# Patient Record
Sex: Female | Born: 1965 | Race: Black or African American | Hispanic: No | Marital: Single | Smoking: Never smoker
Health system: Southern US, Community
[De-identification: ages and names within clinical notes are randomized; demographics above are authoritative.]

## PROBLEM LIST (undated history)

## (undated) DIAGNOSIS — R079 Chest pain, unspecified: Secondary | ICD-10-CM

## (undated) DIAGNOSIS — T8859XA Other complications of anesthesia, initial encounter: Secondary | ICD-10-CM

## (undated) DIAGNOSIS — F419 Anxiety disorder, unspecified: Secondary | ICD-10-CM

## (undated) DIAGNOSIS — G8929 Other chronic pain: Secondary | ICD-10-CM

## (undated) DIAGNOSIS — I219 Acute myocardial infarction, unspecified: Secondary | ICD-10-CM

## (undated) DIAGNOSIS — K828 Other specified diseases of gallbladder: Secondary | ICD-10-CM

## (undated) DIAGNOSIS — R002 Palpitations: Secondary | ICD-10-CM

## (undated) DIAGNOSIS — A4902 Methicillin resistant Staphylococcus aureus infection, unspecified site: Secondary | ICD-10-CM

## (undated) DIAGNOSIS — R569 Unspecified convulsions: Secondary | ICD-10-CM

## (undated) DIAGNOSIS — J45909 Unspecified asthma, uncomplicated: Secondary | ICD-10-CM

## (undated) DIAGNOSIS — E669 Obesity, unspecified: Secondary | ICD-10-CM

## (undated) DIAGNOSIS — Z9289 Personal history of other medical treatment: Secondary | ICD-10-CM

## (undated) DIAGNOSIS — Z6841 Body Mass Index (BMI) 40.0 and over, adult: Secondary | ICD-10-CM

## (undated) DIAGNOSIS — G40909 Epilepsy, unspecified, not intractable, without status epilepticus: Secondary | ICD-10-CM

## (undated) DIAGNOSIS — T4145XA Adverse effect of unspecified anesthetic, initial encounter: Secondary | ICD-10-CM

## (undated) DIAGNOSIS — R7303 Prediabetes: Secondary | ICD-10-CM

## (undated) DIAGNOSIS — L732 Hidradenitis suppurativa: Secondary | ICD-10-CM

## (undated) DIAGNOSIS — M545 Low back pain: Secondary | ICD-10-CM

## (undated) DIAGNOSIS — R197 Diarrhea, unspecified: Secondary | ICD-10-CM

## (undated) DIAGNOSIS — K6289 Other specified diseases of anus and rectum: Secondary | ICD-10-CM

## (undated) DIAGNOSIS — G4733 Obstructive sleep apnea (adult) (pediatric): Secondary | ICD-10-CM

## (undated) DIAGNOSIS — I10 Essential (primary) hypertension: Secondary | ICD-10-CM

## (undated) DIAGNOSIS — K219 Gastro-esophageal reflux disease without esophagitis: Secondary | ICD-10-CM

## (undated) DIAGNOSIS — E039 Hypothyroidism, unspecified: Secondary | ICD-10-CM

## (undated) DIAGNOSIS — Z87898 Personal history of other specified conditions: Secondary | ICD-10-CM

## (undated) DIAGNOSIS — G43909 Migraine, unspecified, not intractable, without status migrainosus: Secondary | ICD-10-CM

## (undated) HISTORY — DX: Palpitations: R00.2

## (undated) HISTORY — DX: Obstructive sleep apnea (adult) (pediatric): G47.33

## (undated) HISTORY — PX: CARDIAC CATHETERIZATION: SHX172

## (undated) HISTORY — DX: Chest pain, unspecified: R07.9

## (undated) HISTORY — DX: Other specified diseases of gallbladder: K82.8

## (undated) HISTORY — DX: Morbid (severe) obesity due to excess calories: E66.01

## (undated) HISTORY — DX: Methicillin resistant Staphylococcus aureus infection, unspecified site: A49.02

## (undated) HISTORY — DX: Obesity, unspecified: E66.9

## (undated) HISTORY — PX: IMPLANTATION VAGAL NERVE STIMULATOR: SUR692

## (undated) HISTORY — DX: Epilepsy, unspecified, not intractable, without status epilepticus: G40.909

## (undated) HISTORY — DX: Other chronic pain: G89.29

## (undated) HISTORY — DX: Personal history of other medical treatment: Z92.89

## (undated) HISTORY — DX: Essential (primary) hypertension: I10

---
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
## 2007-03-05 ENCOUNTER — Emergency Department (HOSPITAL_COMMUNITY): Admission: EM | Admit: 2007-03-05 | Discharge: 2007-03-05 | Payer: Self-pay | Admitting: Emergency Medicine

## 2007-03-21 ENCOUNTER — Emergency Department (HOSPITAL_COMMUNITY): Admission: EM | Admit: 2007-03-21 | Discharge: 2007-03-21 | Payer: Self-pay | Admitting: Emergency Medicine

## 2008-02-27 ENCOUNTER — Ambulatory Visit: Payer: Self-pay | Admitting: Cardiovascular Disease

## 2008-02-27 ENCOUNTER — Observation Stay (HOSPITAL_COMMUNITY): Admission: EM | Admit: 2008-02-27 | Discharge: 2008-02-28 | Payer: Self-pay | Admitting: Emergency Medicine

## 2008-02-28 ENCOUNTER — Encounter: Payer: Self-pay | Admitting: Cardiovascular Disease

## 2008-03-10 ENCOUNTER — Encounter: Payer: Self-pay | Admitting: Cardiology

## 2008-03-10 ENCOUNTER — Ambulatory Visit: Payer: Self-pay

## 2008-03-17 ENCOUNTER — Ambulatory Visit: Payer: Self-pay | Admitting: Cardiology

## 2009-01-21 ENCOUNTER — Emergency Department (HOSPITAL_COMMUNITY): Admission: EM | Admit: 2009-01-21 | Discharge: 2009-01-21 | Payer: Self-pay | Admitting: Emergency Medicine

## 2009-07-27 ENCOUNTER — Emergency Department (HOSPITAL_COMMUNITY): Admission: EM | Admit: 2009-07-27 | Discharge: 2009-07-27 | Payer: Self-pay | Admitting: Emergency Medicine

## 2009-09-17 ENCOUNTER — Emergency Department (HOSPITAL_COMMUNITY): Admission: EM | Admit: 2009-09-17 | Discharge: 2009-09-17 | Payer: Self-pay | Admitting: Family Medicine

## 2010-04-14 ENCOUNTER — Emergency Department (HOSPITAL_COMMUNITY): Admission: EM | Admit: 2010-04-14 | Discharge: 2010-04-14 | Payer: Self-pay | Admitting: Emergency Medicine

## 2010-08-30 ENCOUNTER — Other Ambulatory Visit: Payer: Self-pay | Admitting: Neurology

## 2010-08-30 DIAGNOSIS — R569 Unspecified convulsions: Secondary | ICD-10-CM

## 2010-08-30 DIAGNOSIS — G43009 Migraine without aura, not intractable, without status migrainosus: Secondary | ICD-10-CM

## 2010-09-10 LAB — POCT URINALYSIS DIP (DEVICE)
Bilirubin Urine: NEGATIVE
Glucose, UA: NEGATIVE mg/dL
Hgb urine dipstick: NEGATIVE
Nitrite: NEGATIVE
Protein, ur: NEGATIVE mg/dL
Specific Gravity, Urine: 1.025 (ref 1.005–1.030)
Urobilinogen, UA: 2 mg/dL — ABNORMAL HIGH (ref 0.0–1.0)
pH: 6 (ref 5.0–8.0)

## 2010-09-10 LAB — WET PREP, GENITAL
Clue Cells Wet Prep HPF POC: NONE SEEN
Trich, Wet Prep: NONE SEEN
Yeast Wet Prep HPF POC: NONE SEEN

## 2010-09-10 LAB — POCT PREGNANCY, URINE: Preg Test, Ur: NEGATIVE

## 2010-09-10 LAB — GC/CHLAMYDIA PROBE AMP, GENITAL
Chlamydia, DNA Probe: NEGATIVE
GC Probe Amp, Genital: NEGATIVE

## 2010-10-18 NOTE — Consult Note (Signed)
NAMEJOLANA, [name] NO.:  [id number]   MEDICAL RECORD NO.:  [id number]          PATIENT TYPE:  EMS   LOCATION:  MAJO                         FACILITY:  [facility]   PHYSICIAN:  Verne Carrow, MDDATE OF BIRTH:  Aug 30, 1965   DATE OF CONSULTATION:  02/27/2008  DATE OF DISCHARGE:                                 CONSULTATION   HISTORY OF PRESENT ILLNESS:  This patient is a pleasant 45 year old  African American female with a history of hypertension and seizure  disorder since childhood who is admitted through the Emergency  Department with complaints of increased palpitations last night around  11 p.m. while lying in bed at home. There were some associated sharp  substernal chest pains when she was noticing the palpitations.  The  chest pain and palpitations continued for approximately three hours.  She presented to the Emergency Department today for further evaluation  of her chest pain.  Her initial EKG shows no ischemic changes. Her  initial point of care Troponin was 0.13 but the first real Troponin  through the lab was less than 0.01.  The patient is currently  asymptomatic.  She tells me that she had a vagal nerve stimulator placed  for treatment of her seizure disorder. While I was talking to the  patient this stimulator did activate.  The patient says that when her  stimulator activates she sometimes feels short of breath and does notice  some palpitations.  She otherwise had no complaints today and denies any  shortness of breath, dizziness, near syncope or syncope.   PAST MEDICAL HISTORY:  1. Hypertension.  2. Seizure disorder since childhood.  3. History of palpitations.  4. Reports of normal left heart catheterization at [facility] in [place] within the last year.  5. No history of diabetes mellitus or hyperlipidemia.   PAST SURGICAL HISTORY:  C-section and placement of the vagal nerve  stimulator.   ALLERGIES:   AMITRIPTYLINE AND LATEX.   HOME MEDICATIONS:  1. Toprol XL 12.5 mg once daily  2. Topiramate 50 mg twice daily.  3. Synthroid 50 mcg once daily.  4. Hydrochlorothiazide 12.5 mg once daily  5. Nifedipine 60 mg once daily.  6. Multivitamin once daily.   SOCIAL HISTORY:  The patient is single and has three children.  She does  not use tobacco, alcohol or illicit drugs.   FAMILY HISTORY:  The patient's mother died at the age of 52 from  myocardial infarction.  Her father was murdered at a young age.  She has  no other family history of coronary artery disease or sudden cardiac  death.   REVIEW OF SYSTEMS:  As noted in the history of present illness and is  otherwise negative.   PHYSICAL EXAMINATION:  GENERAL:  She is a pleasant middle aged [place]  American female in no acute distress.  VITALS:  Blood pressure 134/84, pulse 72 and regular, respirations 12  and nonlabored. Saturation is at 100% on room air.  SKIN:  Warm and dry.  NECK:  No JVP, no carotid bruits,  no thyromegaly, no lymphadenopathy.  Oropharynx clear and mucous membranes moist.  LUNGS:  Clear to auscultation bilaterally without wheezes, rhonchi or  crackles note.  CARDIOVASCULAR: Regular rate and rhythm without murmurs, gallops or rubs  noted.  ABDOMEN:  Soft, non-tender, bowel sounds are present.  EXTREMITIES:  No evidence of edema.  Pulses are 2+ in all extremities.   DIAGNOSTIC STUDIES:  1. 12 lead electrocardiogram obtained in the Emergency Room shows      normal sinus rhythm with no evidence of ischemia.  2. Laboratory values on admission show a creatinine of 0.8, potassium      3.7, hemoglobin 11.8, platelets 258,000. First Troponin less than      0.01.   ASSESSMENT & PLAN:  This is a pleasant 44 year old African American  female whose only risk factors for coronary artery disease are  hypertension and a family history of coronary disease who presents to  the Emergency Department with atypical chest pain.  She will be admitted  to a telemetry bed tonight and ruled out for a myocardial infarction  with serial cardiac enzymes.  I suspect that her chest pain has a non-  cardiac etiology. We will continue all of her home medications.  If she  rules in for a myocardial infarction we will plan a left heart  catheterization tomorrow.  Otherwise we will reassess tomorrow and  consider stress Myoview. I will also continue her Topiramate for her  seizure disorder.      Verne Carrow, MD  Electronically Signed     CM/MEDQ  D:  02/27/2008  T:  02/27/2008  Job:  [id number]

## 2010-10-18 NOTE — Discharge Summary (Signed)
NAMESALENA, Richardson NO.:  [id number]   MEDICAL RECORD NO.:  [id number]          PATIENT TYPE:  INP   LOCATION:  2021                         FACILITY:  [facility]   PHYSICIAN:  Marca Ancona, MD      DATE OF BIRTH:  08-26-1965   DATE OF ADMISSION:  02/27/2008  DATE OF DISCHARGE:  02/28/2008                               DISCHARGE SUMMARY   PRIMARY CARDIOLOGIST:  Marca Ancona, MD   DISCHARGE DIAGNOSIS:  Chest pain.   SECONDARY DIAGNOSES:  1. Hypertension.  2. Seizure disorder since childhood.  3. Palpitations.  4. History of normal cardiac catheterization, last year at [facility] in [place].  5. Status post cesarean section and placement of vagal nerve      stimulator.   ALLERGIES:  AMITRIPTYLINE and LATEX.   PROCEDURES:  None.   HISTORY OF PRESENT ILLNESS:  A 45 year old African American female with  prior history of hypertension, seizure disorder, and palpitations as  well as normal coronary artery by catheterization a year ago.  She was  in her usual state of health until approximately 11 p.m. on the evening  prior to admission when she experienced palpitations with associated  chest pain.  Symptoms lasted about 3 hours and resolved spontaneously.  Because of the symptoms during the night, she presented to the [facility] ED.  In the ED, her point-of-care troponin was 0.13, however,  subsequent troponin that was sent up to the lab was normal at 0.01.  She  was placed in observation for formal.   HOSPITAL COURSE:  [name] did rule out for MI with normal cardiac  markers x3.  She did have an episode of chest discomfort during the  night of February 27, 2008, however, felt this was likely indigestion  as it occurred after eating.  This morning she feels well and was  ambulating without difficulty.  We have arranged for a followup in our  office with an outpatient stress echocardiogram as well as event  monitoring, given her history  of palpitations.  She will be discharged  home today in good condition.   DISCHARGE LABORATORY DATA:  Hemoglobin 12.3, hematocrit 37.2, WBC 4.7,  and platelets 239.  Sodium 138, potassium 3.5, chloride 106, CO2 of 23,  BUN 10, creatinine 0.78, glucose 99, and calcium 8.8.  CK 169, MB 1.4,  and troponin I less than 0.01.  Total cholesterol 206, triglycerides 67,  HDL 45, and LDL 148.   DISPOSITION:  The patient is being discharged home today in good  condition.   FOLLOWUP PLANS AND APPOINTMENTS:  We have arranged for her to follow up  in our office for a stress echocardiogram, March 10, 2008, at 10:30  a.m.  She will also pick up an event monitor on that day.  She will  follow up with Dr. Marca Ancona on March 24, 2008, at 9:15 a.m.   DISCHARGE MEDICATIONS:  1. Protonix 40 mg daily (new).  2. Nitroglycerin 0.4 mg sublingual p.r.n. chest pain.  3. Prenatal plus daily.  4. Hydrochlorothiazide 12.5  mg daily.  5. Metoprolol ER 12.5 mg daily.  6. Topiramate 50 mg 2 tablets daily.  7. Synthroid 50 mcg daily.  8. Flovent inhaler 2 puffs daily.  9. Nifedipine ER 60 mg daily.   OUTSTANDING LABORATORY STUDIES:  Stress echocardiogram and event  monitoring pending.   DURATION OF DISCHARGE/ENCOUNTER:  Sixty minutes including physician  time.      Nicolasa Ducking, ANP      Marca Ancona, MD  Electronically Signed    CB/MEDQ  D:  02/28/2008  T:  02/29/2008  Job:  [phone]

## 2010-11-04 ENCOUNTER — Other Ambulatory Visit: Payer: Self-pay | Admitting: Podiatry

## 2010-11-04 DIAGNOSIS — R52 Pain, unspecified: Secondary | ICD-10-CM

## 2010-11-07 ENCOUNTER — Other Ambulatory Visit: Payer: Self-pay

## 2011-03-01 ENCOUNTER — Emergency Department (HOSPITAL_COMMUNITY): Payer: Medicare HMO

## 2011-03-01 ENCOUNTER — Emergency Department (HOSPITAL_COMMUNITY)
Admission: EM | Admit: 2011-03-01 | Discharge: 2011-03-02 | Disposition: A | Discharge status: Home / Self Care | Payer: Medicare HMO | Attending: Emergency Medicine | Admitting: Emergency Medicine

## 2011-03-01 DIAGNOSIS — Z79899 Other long term (current) drug therapy: Secondary | ICD-10-CM | POA: Insufficient documentation

## 2011-03-01 DIAGNOSIS — R51 Headache: Secondary | ICD-10-CM | POA: Insufficient documentation

## 2011-03-01 DIAGNOSIS — G40909 Epilepsy, unspecified, not intractable, without status epilepticus: Secondary | ICD-10-CM | POA: Insufficient documentation

## 2011-03-01 DIAGNOSIS — E039 Hypothyroidism, unspecified: Secondary | ICD-10-CM | POA: Insufficient documentation

## 2011-03-01 DIAGNOSIS — I10 Essential (primary) hypertension: Secondary | ICD-10-CM | POA: Insufficient documentation

## 2011-03-01 DIAGNOSIS — H53149 Visual discomfort, unspecified: Secondary | ICD-10-CM | POA: Insufficient documentation

## 2011-03-01 DIAGNOSIS — R209 Unspecified disturbances of skin sensation: Secondary | ICD-10-CM | POA: Insufficient documentation

## 2011-03-01 DIAGNOSIS — R112 Nausea with vomiting, unspecified: Secondary | ICD-10-CM | POA: Insufficient documentation

## 2011-03-01 LAB — POCT I-STAT, CHEM 8
BUN: 16 mg/dL (ref 6–23)
Calcium, Ion: 1.26 mmol/L (ref 1.12–1.32)
Chloride: 108 mEq/L (ref 96–112)
Creatinine, Ser: 0.9 mg/dL (ref 0.50–1.10)
Glucose, Bld: 91 mg/dL (ref 70–99)
HCT: 39 % (ref 36.0–46.0)
Hemoglobin: 13.3 g/dL (ref 12.0–15.0)
Potassium: 3.8 mEq/L (ref 3.5–5.1)
Sodium: 141 mEq/L (ref 135–145)
TCO2: 21 mmol/L (ref 0–100)

## 2011-03-06 LAB — POCT I-STAT, CHEM 8
BUN: 14
Calcium, Ion: 1.16
Chloride: 112
Creatinine, Ser: 0.8
Glucose, Bld: 86
HCT: 37
Hemoglobin: 12.6
Potassium: 3.7
Sodium: 142
TCO2: 20

## 2011-03-06 LAB — LIPID PANEL
Cholesterol: 206 — ABNORMAL HIGH
HDL: 45
LDL Cholesterol: 148 — ABNORMAL HIGH
Total CHOL/HDL Ratio: 4.6
Triglycerides: 67
VLDL: 13

## 2011-03-06 LAB — URINALYSIS, ROUTINE W REFLEX MICROSCOPIC
Bilirubin Urine: NEGATIVE
Glucose, UA: NEGATIVE
Ketones, ur: NEGATIVE
Nitrite: NEGATIVE
Protein, ur: NEGATIVE
Specific Gravity, Urine: 1.025
Urobilinogen, UA: 1
pH: 7.5

## 2011-03-06 LAB — DIFFERENTIAL
Basophils Absolute: 0.1
Basophils Relative: 2 — ABNORMAL HIGH
Eosinophils Absolute: 0.4
Eosinophils Relative: 9 — ABNORMAL HIGH
Lymphocytes Relative: 41
Lymphs Abs: 1.9
Monocytes Absolute: 0.3
Monocytes Relative: 7
Neutro Abs: 1.9
Neutrophils Relative %: 42 — ABNORMAL LOW

## 2011-03-06 LAB — BASIC METABOLIC PANEL
BUN: 10
CO2: 23
Calcium: 8.8
Chloride: 106
Creatinine, Ser: 0.78
GFR calc Af Amer: >60
GFR calc non Af Amer: >60
Glucose, Bld: 99
Potassium: 3.5
Sodium: 138

## 2011-03-06 LAB — CK TOTAL AND CKMB (NOT AT ARMC)
CK, MB: 1.9
Relative Index: 1
Total CK: 183 — ABNORMAL HIGH

## 2011-03-06 LAB — CARDIAC PANEL(CRET KIN+CKTOT+MB+TROPI)
CK, MB: 1.4
CK, MB: 1.6
Relative Index: 0.8
Relative Index: 0.9
Total CK: 169
Total CK: 172
Troponin I: 0.01
Troponin I: <0.01

## 2011-03-06 LAB — URINE MICROSCOPIC-ADD ON

## 2011-03-06 LAB — URINE CULTURE
Colony Count: NO GROWTH
Culture: NO GROWTH

## 2011-03-06 LAB — CBC
HCT: 35 — ABNORMAL LOW
HCT: 37.2
Hemoglobin: 11.8 — ABNORMAL LOW
Hemoglobin: 12.3
MCHC: 33.1
MCHC: 33.8
MCV: 84.8
MCV: 86.7
Platelets: 239
Platelets: 258
RBC: 4.13
RBC: 4.29
RDW: 13.1
RDW: 13.4
WBC: 4.6
WBC: 4.7

## 2011-03-06 LAB — TROPONIN I: Troponin I: <0.01

## 2011-03-06 LAB — POCT CARDIAC MARKERS
CKMB, poc: <1 — ABNORMAL LOW
Myoglobin, poc: 42.3
Troponin i, poc: 0.13 — ABNORMAL HIGH

## 2011-03-06 LAB — TSH: TSH: 4.482

## 2011-03-16 LAB — POCT URINALYSIS DIP (DEVICE)
Glucose, UA: NEGATIVE
Nitrite: NEGATIVE
Operator id: 239701
Protein, ur: NEGATIVE
Specific Gravity, Urine: 1.025
Urobilinogen, UA: 2 — ABNORMAL HIGH
pH: 5.5

## 2011-03-16 LAB — RPR: RPR Ser Ql: NONREACTIVE

## 2011-03-16 LAB — URINE CULTURE
Colony Count: NO GROWTH
Culture: NO GROWTH

## 2011-03-16 LAB — GC/CHLAMYDIA PROBE AMP, GENITAL
Chlamydia, DNA Probe: NEGATIVE
GC Probe Amp, Genital: NEGATIVE

## 2011-03-16 LAB — POCT PREGNANCY, URINE
Operator id: 239701
Preg Test, Ur: NEGATIVE

## 2011-03-16 LAB — HIV ANTIBODY (ROUTINE TESTING W REFLEX): HIV: NONREACTIVE

## 2011-04-05 ENCOUNTER — Other Ambulatory Visit (HOSPITAL_COMMUNITY): Payer: Self-pay | Admitting: Obstetrics and Gynecology

## 2011-04-05 DIAGNOSIS — R079 Chest pain, unspecified: Secondary | ICD-10-CM

## 2011-04-06 ENCOUNTER — Encounter (HOSPITAL_COMMUNITY): Payer: Medicare HMO | Admitting: Radiology

## 2011-04-10 ENCOUNTER — Encounter: Payer: Self-pay | Admitting: Cardiology

## 2011-04-10 ENCOUNTER — Ambulatory Visit (HOSPITAL_COMMUNITY): Payer: Medicare PPO | Attending: Cardiology | Admitting: Radiology

## 2011-04-10 VITALS — Ht 65.0 in | Wt 217.0 lb

## 2011-04-10 DIAGNOSIS — R0989 Other specified symptoms and signs involving the circulatory and respiratory systems: Secondary | ICD-10-CM | POA: Insufficient documentation

## 2011-04-10 DIAGNOSIS — R0789 Other chest pain: Secondary | ICD-10-CM | POA: Insufficient documentation

## 2011-04-10 DIAGNOSIS — R42 Dizziness and giddiness: Secondary | ICD-10-CM | POA: Insufficient documentation

## 2011-04-10 DIAGNOSIS — R0602 Shortness of breath: Secondary | ICD-10-CM

## 2011-04-10 DIAGNOSIS — R110 Nausea: Secondary | ICD-10-CM | POA: Insufficient documentation

## 2011-04-10 DIAGNOSIS — R0609 Other forms of dyspnea: Secondary | ICD-10-CM | POA: Insufficient documentation

## 2011-04-10 DIAGNOSIS — I2510 Atherosclerotic heart disease of native coronary artery without angina pectoris: Secondary | ICD-10-CM | POA: Insufficient documentation

## 2011-04-10 DIAGNOSIS — R002 Palpitations: Secondary | ICD-10-CM | POA: Insufficient documentation

## 2011-04-10 DIAGNOSIS — R5381 Other malaise: Secondary | ICD-10-CM | POA: Insufficient documentation

## 2011-04-10 DIAGNOSIS — I491 Atrial premature depolarization: Secondary | ICD-10-CM

## 2011-04-10 DIAGNOSIS — I10 Essential (primary) hypertension: Secondary | ICD-10-CM | POA: Insufficient documentation

## 2011-04-10 DIAGNOSIS — R61 Generalized hyperhidrosis: Secondary | ICD-10-CM | POA: Insufficient documentation

## 2011-04-10 DIAGNOSIS — J45909 Unspecified asthma, uncomplicated: Secondary | ICD-10-CM | POA: Insufficient documentation

## 2011-04-10 DIAGNOSIS — R079 Chest pain, unspecified: Secondary | ICD-10-CM

## 2011-04-10 DIAGNOSIS — R9431 Abnormal electrocardiogram [ECG] [EKG]: Secondary | ICD-10-CM | POA: Insufficient documentation

## 2011-04-10 MED ORDER — TECHNETIUM TC 99M TETROFOSMIN IV KIT
33.0000 | PACK | Freq: Once | INTRAVENOUS | Status: AC | PRN
  Administered 2011-04-10: 33 via INTRAVENOUS

## 2011-04-10 NOTE — Progress Notes (Addendum)
[facility] SITE 3 NUCLEAR MED [address] [place] [address] [phone]  Cardiology Nuclear Med Study  [name] is a 45 y.o. female [id number] January 14, 1966   Nuclear Med Background Indication for Stress Test:  Evaluation for Ischemia History: Abnormal EKG, Asthma, '08 Cath: NL ([place], [place]), 10/09 Echo: (Stress, Submaximal): (-) Ischemia, EF=60%, history vagal nerve stimulator for seizure disorder Cardiac Risk Factors: Family History - CAD and Hypertension  Symptoms:  Chest Pain and  Pressure with and without Exertion (last date of chest discomfort earlier this am), Diaphoresis, Dizziness, DOE, Fatigue, Fatigue with Exertion, Light-Headedness, Nausea and Palpitations   Nuclear Pre-Procedure Caffeine/Decaff Intake:  None NPO After: 7:00pm   Lungs:  Clear IV 0.9% NS with Angio Cath:  20g  IV Site: R Antecubital  IV Started by:  Stanton Kidney, EMT-P  Chest Size (in):  48 Cup Size: DD  Height: 5\' 5"  (1.651 m)  Weight:  217 lb (98.431 kg)  BMI:  Body mass index is 36.11 kg/(m^2). Tech Comments:  Metoprolol held > 24 hours, per patient.    Nuclear Med Study 1 or 2 day study: 2 day  Stress Test Type:  Stress  Reading MD: Olga Millers, MD  Order Authorizing Provider:  Marca Ancona, MD, Maisie Fus, MD  Resting Radionuclide: Technetium 3m Tetrofosmin  Resting Radionuclide Dose: 33.0 mCi on 04/18/11   Stress Radionuclide:  Technetium 13m Tetrofosmin  Stress Radionuclide Dose: 33.0 mCi on 04/10/11           Stress Protocol Rest HR: 92 Stress HR: 150  Rest BP: 112/71 Stress BP: 132/64  Exercise Time (min): 7:15 METS: 9.1   Predicted Max HR: 173 bpm % Max HR: 86.71 bpm Rate Pressure Product: 52841   Dose of Adenosine (mg):  n/a Dose of Lexiscan: n/a mg  Dose of Atropine (mg): n/a Dose of Dobutamine: n/a mcg/kg/min (at max HR)  Stress Test Technologist: Irean Hong, RN  Nuclear Technologist:  Doyne Keel, CNMT     Rest Procedure:   Myocardial perfusion imaging was performed at rest 45 minutes following the intravenous administration of Technetium 86m Tetrofosmin. Rest ECG: NSR, short PR, and T wave changes  Stress Procedure:  The patient exercised for 7 minutes and 15 seconds, RPE=17.  The patient stopped due to DOE and chest tightness 8/10 at peak exercise.  There were significant ST-T wave changes. The patient complained of lightheadedness immediately post exercise with a slight decrease in BP to 130/57. There were occasional PAC's. Technetium 19m Tetrofosmin was injected at peak exercise and myocardial perfusion imaging was performed after a brief delay.The patient has an office visit with Dr.Dalton Shirlee Latch on 04/11/11. Stress ECG: Significant ST abnormalities consistent with ischemia.  QPS Raw Data Images:  There is a breast shadow. Stress Images:  There is decreased uptake in the apex. Rest Images:  There is decreased uptake in the apex. Subtraction (SDS):  No evidence of ischemia. Transient Ischemic Dilatation (Normal <1.22):  1.02 Lung/Heart Ratio (Normal <0.45):  0.32  Quantitative Gated Spect Images QGS EDV:  85 ml QGS ESV:  29 ml QGS cine images:  NL LV Function; NL Wall Motion QGS EF: 66%  Impression Exercise Capacity:  Fair exercise capacity. BP Response:  Normal blood pressure response. Clinical Symptoms:  Chest pain with exercise. ECG Impression:  Significant ST abnormalities consistent with ischemia. Comparison with Prior Nuclear Study: No previous nuclear study performed  Overall Impression:  Stress nuclear study with chest pain and ST changes; mild  apical thinning but no ischemia noted on perfusion images.     Olga Millers   Abnormal exercise ECG and chest pain.  Fixed apical defect that could be suggestive of prior MI but no ischemia on perfusion images.  Needs office followup.   Dalton [employer]

## 2011-04-11 ENCOUNTER — Encounter (HOSPITAL_COMMUNITY): Payer: Medicare PPO | Admitting: Radiology

## 2011-04-11 ENCOUNTER — Ambulatory Visit: Payer: Medicare HMO | Admitting: Cardiology

## 2011-04-18 ENCOUNTER — Encounter (HOSPITAL_COMMUNITY): Payer: Medicare HMO | Admitting: Radiology

## 2011-04-18 ENCOUNTER — Ambulatory Visit (HOSPITAL_COMMUNITY): Payer: Medicare PPO | Attending: Cardiology | Admitting: Radiology

## 2011-04-18 DIAGNOSIS — R0989 Other specified symptoms and signs involving the circulatory and respiratory systems: Secondary | ICD-10-CM

## 2011-04-18 MED ORDER — TECHNETIUM TC 99M TETROFOSMIN IV KIT
33.0000 | PACK | Freq: Once | INTRAVENOUS | Status: AC | PRN
  Administered 2011-04-18: 33 via INTRAVENOUS

## 2011-04-20 NOTE — Progress Notes (Signed)
Discussed results with pt. Appt 04/24/11 with Dr Shirlee Latch

## 2011-04-20 NOTE — Progress Notes (Signed)
Pt has an appt 05/03/11 with Dr Shirlee Latch. LMTCB

## 2011-04-24 ENCOUNTER — Ambulatory Visit (INDEPENDENT_AMBULATORY_CARE_PROVIDER_SITE_OTHER): Payer: Medicare PPO | Admitting: Cardiology

## 2011-04-24 ENCOUNTER — Encounter: Payer: Self-pay | Admitting: *Deleted

## 2011-04-24 ENCOUNTER — Encounter: Payer: Self-pay | Admitting: Cardiology

## 2011-04-24 VITALS — BP 120/78 | HR 92 | Ht 65.0 in | Wt 220.0 lb

## 2011-04-24 DIAGNOSIS — I10 Essential (primary) hypertension: Secondary | ICD-10-CM | POA: Insufficient documentation

## 2011-04-24 DIAGNOSIS — R0789 Other chest pain: Secondary | ICD-10-CM | POA: Insufficient documentation

## 2011-04-24 DIAGNOSIS — R079 Chest pain, unspecified: Secondary | ICD-10-CM

## 2011-04-24 MED ORDER — ISOSORBIDE MONONITRATE ER 60 MG PO TB24: 60.0000 mg | ORAL_TABLET | Freq: Two times a day (BID) | ORAL | Status: DC

## 2011-04-24 NOTE — Patient Instructions (Addendum)
Increase Imdur(isosorbide) to 60mg  daily. You can take two 30mg  tablets daily at the same time.    Your physician has requested that you have cardiac CT. Cardiac computed tomography (CT) is a painless test that uses an x-ray machine to take clear, detailed pictures of your heart. For further information please visit [email or web address] Please follow instruction sheet as given. This week on a day Dr Shirlee Latch is in the hospital--11/21/or 11/23 TAKE LOPRESSOR(METOPROLOL) 50mg  2 HOURS BEFORE THE CT SCAN--this will be four 12.5mg  tablets.  Your physician has requested that you have an echocardiogram. Echocardiography is a painless test that uses sound waves to create images of your heart. It provides your doctor with information about the size and shape of your heart and how well your heart's chambers and valves are working. This procedure takes approximately one hour. There are no restrictions for this procedure.   Your physician recommends that you return for a FASTING lipid profile /BMET/TSH 786.50 -you can have this when you come back for the echocardiogram.    Your physician recommends that you schedule a follow-up appointment in: 2 weeks with Dr Shirlee Latch.

## 2011-04-24 NOTE — Assessment & Plan Note (Signed)
BP is under control. ° °

## 2011-04-24 NOTE — Assessment & Plan Note (Addendum)
Patient has had episodes of chest pain both with exertion and at rest.  She does have a family history of premature CAD as well as HTN.  ETT-myoview was equivocal: ST segment changes on ECG but no ischemia on myoview images.  Presentation is somewhat worrisome, but she did have similar symptoms in 2008 and apparently had a negative cath at that time.  Premature CAD is possible, but I would also be concerned about the possibility of microvascular angina or coronary vasospasm.  It is interesting that she has also been having worse migraines.  I would certainly entertain the possibility that use of triptan medications for her migraines could potentiate coronary vasospasm (though she cannot temporally associate taking a triptan medication with chest pain).   - I would like to have her do a coronary CT angiogram.  I will have her take 50 mg metoprolol 2 hours before the test.  If this is normal, no further ischemic workup.  If this is abnormal or equivocal, cath.  - Increase Imdur to 60 mg daily, continue Lopressor 12.5 mg bid.  - Continue ASA 81 mg daily.  - Check lipids - I will get an echocardiogram to make sure that LV function is normal.  - I asked her to talk to Dr. Threasa Beards her neurologist about her migraines.  I would like her medications to be adjusted to minimize use of triptan medications given potential for vasospasm.

## 2011-04-24 NOTE — Progress Notes (Signed)
PCP: Dr. Parke Simmers [facility]), Dr. Tona Sensing Lacy Duverney)  45 yo with history of chest pain and prior negative cath as well as migraines and HTN presents for cardiology evaluation.  She has had chest pain chronically over a number of years.  She had an abnormal stress test in [place] in 2008.  She was then sent for a cardiac cath, which she says was normal.  Since then, she has had on and off episodes of chest discomfort.  Her symptoms have been worse, however, for the last month.   Over the last month, she has been getting episodes of chest pressure at night while in bed.  She will also get chest pressure when walking a short distance or going up a flight of steps.  Prior to the past month, she was only having rare episodes of chest pain.  She also gets episodes of chest pressure at rest, such as when watching television.  She has also been having severe migraines for last month, recently associated with facial numbness.  She has been taking sumatriptan when she gets a migraine.  She does not associate using the triptans with chest pain episodes.    We had her do an ETT-myoview in this office.  This showed ischemic-type ST depression on exercise ECG, but there was no evidence for ischemia on myoview images.  EF was 66%.   ECG: NSR, nonspecific anterior T wave flattening.   Labs (9/12): creatinine 0.9  PMH:  1. Chest pain: Multiple episodes over the years.  She had an abnormal stress test in 2008 in [place] and it sounds like she went to [facility] for a cardiac catheterization which per her report was normal.   Stress echo at [facility] in 2010 was submaximal but showed no evidence for ischemia.  ETT-myoview (11/12): 7'15", EF 66%, significant ST depression on exercise ECG, apical thinning on myoview images but no evidence for ischemia.  2. Migraines: Uses triptan medications.  3. HTN 4. Seizure disorder: Has vagus nerve stimulator.  5. Hypothyroidism  SH: Moved to [place] from [place].  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  Nonsmoker.   FH: Mother with MI at 42.  Grandmother with MI at 21.   ROS: All systems reviewed and negative except as per HPI.   Current Outpatient Prescriptions  Medication Sig Dispense Refill  . aspirin 81 MG tablet Take 81 mg by mouth daily.        . fluticasone (FLOVENT HFA) 44 MCG/ACT inhaler Inhale 2 puffs into the lungs 2 (two) times daily.        . hydrochlorothiazide (HYDRODIURIL) 12.5 MG tablet Take 12.5 mg by mouth daily.        Marland Kitchen levothyroxine (SYNTHROID, LEVOTHROID) 50 MCG tablet Take 50 mcg by mouth daily.        . metoprolol tartrate (LOPRESSOR) 12.5 mg TABS Take 12.5 mg by mouth daily.        Marland Kitchen NIFEdipine (PROCARDIA-XL/ADALAT CC) 60 MG 24 hr tablet Take 60 mg by mouth daily.        . prenatal vitamin w/FE, FA (PRENATAL 1 + 1) 27-1 MG TABS Take 1 tablet by mouth as needed.       . rizatriptan (MAXALT) 10 MG tablet Take 10 mg by mouth as needed. May repeat in 2 hours if needed       . SUMAtriptan (IMITREX) 100 MG tablet Take 100 mg by mouth every 2 (two) hours as needed.        Marland Kitchen  topiramate (TOPAMAX) 50 MG tablet Take 50 mg by mouth 2 (two) times daily.        . isosorbide mononitrate (IMDUR) 60 MG 24 hr tablet Take 1 tablet (60 mg total) by mouth 2 (two) times daily.  60 tablet  6    BP 120/78  Pulse 92  Ht 5\' 5"  (1.651 m)  Wt 99.791 kg (220 lb)  BMI 36.61 kg/m2 General: NAD, overweight Neck: No JVD, no thyromegaly or thyroid nodule.  Lungs: Clear to auscultation bilaterally with normal respiratory effort. CV: Nondisplaced PMI.  Heart regular S1/S2, no S3/S4, no murmur.  No peripheral edema.  No carotid bruit.  Normal pedal pulses.  Abdomen: Soft, nontender, no hepatosplenomegaly, no distention.  Skin: Intact without lesions or rashes.  Neurologic: Alert and oriented x 3.  Psych: Normal affect. Extremities: No clubbing or cyanosis.  HEENT: Normal.

## 2011-05-01 ENCOUNTER — Telehealth: Payer: Self-pay | Admitting: Cardiology

## 2011-05-01 MED ORDER — METOPROLOL TARTRATE 50 MG PO TABS: ORAL_TABLET | ORAL | Status: DC

## 2011-05-01 NOTE — Telephone Encounter (Signed)
I talked with pt. She is aware of Dr McLean's recommendations. 

## 2011-05-01 NOTE — Telephone Encounter (Signed)
New Msg: Pt calling stating that medication given prior to pt ECHO was incorrect.   Pt said she takes 25 mg of metoprolol, pt was given 12.5 mg of metoprolol.   Please return pt call to discuss further.

## 2011-05-01 NOTE — Telephone Encounter (Signed)
I talked with pt. Pt states she is taking metoprolol succinate 25mg  daily and not metoprolol tartrate 12.5mg  daily. Pt asking what she should take prior to CCTA since she is taking metoprolol succinate and not metoprolol tartrate. I reviewed with Dr Shirlee Latch. Pt to take metoprolol tartrate 50mg  as planned 2 hours prior to CCTA in addition to her regular metoprolol succinate dose. LMTCB

## 2011-05-02 ENCOUNTER — Ambulatory Visit (HOSPITAL_COMMUNITY): Payer: Medicare PPO | Attending: Cardiology

## 2011-05-02 ENCOUNTER — Other Ambulatory Visit (INDEPENDENT_AMBULATORY_CARE_PROVIDER_SITE_OTHER): Payer: Medicare PPO | Admitting: *Deleted

## 2011-05-02 DIAGNOSIS — R0609 Other forms of dyspnea: Secondary | ICD-10-CM | POA: Insufficient documentation

## 2011-05-02 DIAGNOSIS — R0989 Other specified symptoms and signs involving the circulatory and respiratory systems: Secondary | ICD-10-CM | POA: Insufficient documentation

## 2011-05-02 DIAGNOSIS — R079 Chest pain, unspecified: Secondary | ICD-10-CM

## 2011-05-02 DIAGNOSIS — R072 Precordial pain: Secondary | ICD-10-CM | POA: Insufficient documentation

## 2011-05-02 DIAGNOSIS — I079 Rheumatic tricuspid valve disease, unspecified: Secondary | ICD-10-CM | POA: Insufficient documentation

## 2011-05-02 DIAGNOSIS — I059 Rheumatic mitral valve disease, unspecified: Secondary | ICD-10-CM | POA: Insufficient documentation

## 2011-05-02 DIAGNOSIS — I379 Nonrheumatic pulmonary valve disorder, unspecified: Secondary | ICD-10-CM | POA: Insufficient documentation

## 2011-05-02 DIAGNOSIS — I10 Essential (primary) hypertension: Secondary | ICD-10-CM

## 2011-05-02 LAB — BASIC METABOLIC PANEL
BUN: 11 mg/dL (ref 6–23)
CO2: 24 mEq/L (ref 19–32)
Calcium: 8.8 mg/dL (ref 8.4–10.5)
Chloride: 109 mEq/L (ref 96–112)
Creatinine, Ser: 0.8 mg/dL (ref 0.4–1.2)
GFR: 103.25 mL/min (ref >60.00)
Glucose, Bld: 83 mg/dL (ref 70–99)
Potassium: 3.6 mEq/L (ref 3.5–5.1)
Sodium: 140 mEq/L (ref 135–145)

## 2011-05-02 LAB — LIPID PANEL
Cholesterol: 192 mg/dL (ref 0–200)
HDL: 49.2 mg/dL (ref >39.00)
LDL Cholesterol: 130 mg/dL — ABNORMAL HIGH (ref 0–99)
Total CHOL/HDL Ratio: 4
Triglycerides: 62 mg/dL (ref 0.0–149.0)
VLDL: 12.4 mg/dL (ref 0.0–40.0)

## 2011-05-02 LAB — TSH: TSH: 1.46 u[IU]/mL (ref 0.35–5.50)

## 2011-05-03 ENCOUNTER — Ambulatory Visit: Payer: Medicare PPO | Admitting: Cardiology

## 2011-05-03 ENCOUNTER — Ambulatory Visit (HOSPITAL_COMMUNITY)
Admission: RE | Admit: 2011-05-03 | Discharge: 2011-05-03 | Disposition: A | Discharge status: Home / Self Care | Payer: Medicare PPO | Source: Ambulatory Visit | Attending: Cardiology | Admitting: Cardiology

## 2011-05-03 DIAGNOSIS — R079 Chest pain, unspecified: Secondary | ICD-10-CM | POA: Insufficient documentation

## 2011-05-03 MED ORDER — METOPROLOL TARTRATE 1 MG/ML IV SOLN
INTRAVENOUS | Status: AC
  Administered 2011-05-03: 5 mg
  Filled 2011-05-03: qty 15

## 2011-05-03 MED ORDER — IOHEXOL 350 MG/ML SOLN
80.0000 mL | Freq: Once | INTRAVENOUS | Status: AC | PRN
  Administered 2011-05-03: 80 mL via INTRAVENOUS

## 2011-05-03 MED ORDER — NITROGLYCERIN 0.4 MG SL SUBL
SUBLINGUAL_TABLET | SUBLINGUAL | Status: AC
  Administered 2011-05-03: 0.4 mg
  Filled 2011-05-03: qty 25

## 2011-05-03 MED ORDER — METOPROLOL TARTRATE 1 MG/ML IV SOLN
5.0000 mg | INTRAVENOUS | Status: DC | PRN
  Administered 2011-05-03 (×3): 5 mg via INTRAVENOUS
  Filled 2011-05-03: qty 5

## 2011-05-03 MED ORDER — IOHEXOL 350 MG/ML SOLN
28.0000 mL | Freq: Once | INTRAVENOUS | Status: AC | PRN
  Administered 2011-05-03: 28 mL via INTRAVENOUS

## 2011-05-04 NOTE — Progress Notes (Signed)
Addended by: Brien Mates R on: 05/04/2011 12:49 PM   Modules accepted: Orders

## 2011-05-05 ENCOUNTER — Telehealth: Payer: Self-pay | Admitting: Cardiology

## 2011-05-05 NOTE — Telephone Encounter (Signed)
Pt came in signed ROI, Records were faxed to: [facility] @ [phone]& [facility] Dr.Velta Parke Simmers @ [phone] 05-05-11/km

## 2011-05-17 ENCOUNTER — Ambulatory Visit (INDEPENDENT_AMBULATORY_CARE_PROVIDER_SITE_OTHER): Payer: Medicare PPO | Admitting: Cardiology

## 2011-05-17 ENCOUNTER — Encounter: Payer: Self-pay | Admitting: Cardiology

## 2011-05-17 DIAGNOSIS — E785 Hyperlipidemia, unspecified: Secondary | ICD-10-CM

## 2011-05-17 DIAGNOSIS — R079 Chest pain, unspecified: Secondary | ICD-10-CM

## 2011-05-17 DIAGNOSIS — I10 Essential (primary) hypertension: Secondary | ICD-10-CM

## 2011-05-17 DIAGNOSIS — E7800 Pure hypercholesterolemia, unspecified: Secondary | ICD-10-CM

## 2011-05-17 HISTORY — DX: Hyperlipidemia, unspecified: E78.5

## 2011-05-17 MED ORDER — FLUTICASONE-SALMETEROL 250-50 MCG/DOSE IN AEPB: INHALATION_SPRAY | RESPIRATORY_TRACT | Status: DC

## 2011-05-17 MED ORDER — PRAVASTATIN SODIUM 40 MG PO TABS: 40.0000 mg | ORAL_TABLET | Freq: Every evening | ORAL | Status: DC

## 2011-05-17 NOTE — Assessment & Plan Note (Signed)
BP is well controlled.  She is now on Exforge and Bystolic.

## 2011-05-17 NOTE — Progress Notes (Signed)
PCP: Dr. Parke Simmers [facility]), Dr. Tona Sensing [place] Duverney)  45 yo with history of chest pain and prior negative cath as well as migraines and HTN presents for cardiology followup.  She has had chest pain chronically over a number of years.  She had an abnormal stress test in [place] in 2008.  She was then sent for a cardiac cath, which she says was normal.  Since then, she has had on and off episodes of chest discomfort.  Her symptoms have been worse, however, for the last few months.   We had her do an ETT-myoview in this office.  This showed ischemic-type ST depression on exercise ECG, but there was no evidence for ischemia on myoview images.  EF was 66%.  Given ongoing chest pain and ischemic ECG changes with normal perfusion images (equivocal study), I had her get a coronary CT angiogram.  This was a difficult study because we were not able to get her HR as low as would be ideal.  However, she had no coronary calcium and no definite stenosis was seen (though mid-RCA was not fully evaluated due to artifact).  I think that this was a negative study.    Symptoms are stable.  She has been getting episodes of chest pressure at night while in bed.  She will also get chest pressure when walking a short distance or going up a flight of steps. She also gets episodes of chest pressure at rest, such as when watching television.  She has not been taking her triptan migraine medication, but still is having chest pain episodes.  On further questioning, her chest pain has been associated with wheezing.  She does get exertional wheezing.  She has had a history of asthma since childhood.  She will occasionally use albuterol when she gets the exertional chest pain and wheezing and thinks it helps.    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 Chest pain: Multiple episodes over the years.  She had an abnormal stress test in 2008 in [place] and it sounds  like she went to [facility] for a cardiac catheterization which per her report was normal.   Stress echo at [facility] in 2010 was submaximal but showed no evidence for ischemia.  ETT-myoview (11/12): 7'15", EF 66%, significant ST depression on exercise ECG, apical thinning on myoview images but no evidence for ischemia. Coronary CT angiogram (11/12): calcium score 0, difficult study with gating artifact, mid RCA nonevaluatable but no stenosis elsewhere in the coronaries.   Echo (11/12): EF 55-60%, mild LV hypertrophy, mild MR.  2. Migraines: Uses triptan medications.  3. HTN 4. Seizure disorder: Has vagus nerve stimulator.  5. Hypothyroidism 6. Asthma since childhood  SH: Moved to [place] from [place].  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   FH: Mother with MI at 42.  Grandmother with MI at 71.   ROS: All systems reviewed and negative except as per HPI.   Current Outpatient Prescriptions  Medication Sig Dispense Refill  . Amlodipine-Valsartan-HCTZ 5-160-25 MG TABS Take 1 tablet by mouth daily.        Marland Kitchen aspirin 81 MG tablet Take 81 mg by mouth daily.        . isosorbide mononitrate (IMDUR) 60 MG 24 hr tablet One daily      . levothyroxine (SYNTHROID, LEVOTHROID) 50 MCG tablet Take 50 mcg by mouth daily.        Marland Kitchen  prenatal vitamin w/FE, FA (PRENATAL 1 + 1) 27-1 MG TABS Take 1 tablet by mouth as needed.       . rizatriptan (MAXALT) 10 MG tablet Take 10 mg by mouth as needed. May repeat in 2 hours if needed       . SUMAtriptan (IMITREX) 100 MG tablet Take 100 mg by mouth every 2 (two) hours as needed.        . topiramate (TOPAMAX) 50 MG tablet Take 50 mg by mouth 2 (two) times daily.        Marland Kitchen DISCONTD: isosorbide mononitrate (IMDUR) 60 MG 24 hr tablet Take 1 tablet (60 mg total) by mouth 2 (two) times daily.  60 tablet  6  . Fluticasone-Salmeterol (ADVAIR DISKUS) 250-50 MCG/DOSE AEPB Inhale one puff into lungs twice a day  60 each  6  . nebivolol (BYSTOLIC) 5  MG tablet Take 1 tablet (5 mg total) by mouth daily.      . pravastatin (PRAVACHOL) 40 MG tablet Take 1 tablet (40 mg total) by mouth every evening.  30 tablet  3    BP 110/68  Pulse 91  Ht 5\' 5"  (1.651 m)  Wt 100.608 kg (221 lb 12.8 oz)  BMI 36.91 kg/m2  SpO2 99% General: NAD, overweight Neck: No JVD, no thyromegaly or thyroid nodule.  Lungs: Clear to auscultation bilaterally with normal respiratory effort. CV: Nondisplaced PMI.  Heart regular S1/S2, no S3/S4, no murmur.  No peripheral edema.  No carotid bruit.  Normal pedal pulses.  Abdomen: Soft, nontender, no hepatosplenomegaly, no distention.  Neurologic: Alert and oriented x 3.  Psych: Normal affect. Extremities: No clubbing or cyanosis.

## 2011-05-17 NOTE — Assessment & Plan Note (Signed)
Coronary calcium score of 0 suggests low risk.  LDL is 130.  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

## 2011-05-17 NOTE — Assessment & Plan Note (Signed)
I do not see any evidence for CAD.  It is possible that her symptoms are due to exercise-induced asthma given wheezing associated with chest pain and some relief with albuterol.  Other possibility would be microvascular angina.  I will have her try Advair 250/50 bid to see if it helps her symptoms.

## 2011-05-17 NOTE — Patient Instructions (Signed)
Use advair inhaler twice a day.  Start pravachol 40mg  daily in the evening.  Your physician recommends that you return for a FASTING lipid profile /liver profile in 2 months 272.0  Your physician recommends that you schedule a follow-up appointment in: 2 months with Dr Shirlee Latch.

## 2011-07-12 ENCOUNTER — Other Ambulatory Visit: Payer: Medicare PPO | Admitting: *Deleted

## 2011-07-12 ENCOUNTER — Ambulatory Visit: Payer: Medicare PPO | Admitting: Cardiology

## 2011-08-15 ENCOUNTER — Encounter: Payer: Self-pay | Admitting: Cardiology

## 2011-08-21 ENCOUNTER — Other Ambulatory Visit: Payer: Self-pay | Admitting: Family Medicine

## 2011-09-11 ENCOUNTER — Other Ambulatory Visit: Payer: Medicare PPO

## 2011-09-15 ENCOUNTER — Ambulatory Visit: Payer: Medicare PPO | Admitting: Cardiology

## 2011-10-23 ENCOUNTER — Ambulatory Visit: Payer: Medicare PPO | Admitting: Cardiology

## 2011-10-29 ENCOUNTER — Encounter: Payer: Self-pay | Admitting: Cardiology

## 2011-11-01 ENCOUNTER — Encounter: Payer: Self-pay | Admitting: Cardiology

## 2011-12-04 ENCOUNTER — Ambulatory Visit (INDEPENDENT_AMBULATORY_CARE_PROVIDER_SITE_OTHER): Payer: Medicare PPO | Admitting: Cardiology

## 2011-12-04 ENCOUNTER — Other Ambulatory Visit (INDEPENDENT_AMBULATORY_CARE_PROVIDER_SITE_OTHER): Payer: Medicare PPO

## 2011-12-04 ENCOUNTER — Encounter: Payer: Self-pay | Admitting: Cardiology

## 2011-12-04 VITALS — BP 118/80 | HR 81 | Ht 65.5 in | Wt 218.0 lb

## 2011-12-04 DIAGNOSIS — E785 Hyperlipidemia, unspecified: Secondary | ICD-10-CM

## 2011-12-04 DIAGNOSIS — R0989 Other specified symptoms and signs involving the circulatory and respiratory systems: Secondary | ICD-10-CM

## 2011-12-04 DIAGNOSIS — R079 Chest pain, unspecified: Secondary | ICD-10-CM

## 2011-12-04 MED ORDER — RANOLAZINE ER 500 MG PO TB12: 500.0000 mg | ORAL_TABLET | Freq: Two times a day (BID) | ORAL | Status: DC

## 2011-12-04 NOTE — Assessment & Plan Note (Signed)
Patient had another episode of prolonged chest pain associated with mildly elevated troponin.  I suspect the same thing happened back in 2008.  Left heart cath in 5/13 showed normal coronaries.  I had her do a stress myoview last year with ischemic-type ST depression but normal perfusion images.  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   - Continue amlodipine and Imdur.  - Would add ranolazine 500 mg bid to see if this helps with her symptoms.  She will need an ECG in 2 wks and followup in 1 month.

## 2011-12-04 NOTE — Assessment & Plan Note (Signed)
LDL 91 in 5/13 on simvastatin.

## 2011-12-04 NOTE — Progress Notes (Signed)
Patient ID: [name], female   DOB: 01-08-1966, 46 y.o.   MRN: [id number] PCP: Dr. Parke Simmers [facility]), Dr. Clydie Braun [place] Duverney)  46 yo with history of chest pain and prior negative caths as well as migraines and HTN presents for cardiology followup.  She has had chest pain chronically over a number of years.  She had an abnormal stress test in [place] in 2008.  She was then sent for a cardiac cath, which she says was normal.  We had her do an ETT-myoview in this office in 11/12.  This showed ischemic-type ST depression on exercise ECG, but there was no evidence for ischemia on myoview images.  EF was 66%.  Given ongoing chest pain and ischemic ECG changes with normal perfusion images (equivocal study), I had her get a coronary CT angiogram.  This was a difficult study because we were not able to get her HR as low as would be ideal.  However, she had no coronary calcium and no definite stenosis was seen (though mid-RCA was not fully evaluated due to artifact).  I think that this was a negative study.    She has continued to have chest tightness with walking long distances, climbing a flight of steps, and housework.  She is short of breath with these activities.  In 48/13, she went to the ER in [place] with particularly bad exertional chest pain that lasted a prolonged time.  Troponin was mildly elevated with normal CKMB.  She had a left heart cath in the hospital and [place] which showed no angiographic CAD.  She was sent home on Imdur and amlodipine.  Echo showed normal EF.  Since discharge, she has continued to have her chronic chest pain.      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 Chest pain: Multiple episodes over the years.  She had an abnormal stress test in 46 in [employer] and it sounds like she went to [facility] for a cardiac  catheterization which per her report was normal.   Stress echo at [facility] in 2010 was submaximal but showed no evidence for ischemia.  ETT-myoview (11/12): 7'15", EF 66%, significant ST depression on exercise ECG, apical thinning on myoview images but no evidence for ischemia. Coronary CT angiogram (11/12): calcium score 0, difficult study with gating artifact, mid RCA nonevaluatable but no stenosis elsewhere in the coronaries.   Echo (11/12): EF 55-60%, mild LV hypertrophy, mild MR.  NSTEMI in [place] in 5/13 with elevated troponin but LHC showed normal coronaries.  Echo (5/13) with EF 60-65%, normal valve, no LVH.  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  2. Migraines: Uses triptan medications.  3. HTN 4. Seizure disorder: Has vagus nerve stimulator.  5. Hypothyroidism 6. Asthma since childhood  SH: Moved to [place] from [place].  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   FH: Mother with MI at 33.  Grandmother with MI at 44.   ROS: All systems reviewed and negative except as per HPI.   Current Outpatient Prescriptions  Medication Sig Dispense Refill  . amLODipine (NORVASC) 2.5 MG tablet Take 2.5 mg by mouth daily.      . Fluticasone-Salmeterol (ADVAIR DISKUS) 250-50 MCG/DOSE AEPB Inhale one puff into lungs twice a day  60 each  6  . isosorbide  mononitrate (IMDUR) 60 MG 24 hr tablet 2 (two) times daily.       Marland Kitchen levETIRAcetam (KEPPRA) 500 MG tablet Take 500 mg by mouth 2 (two) times daily.      Marland Kitchen levothyroxine (SYNTHROID, LEVOTHROID) 50 MCG tablet Take 50 mcg by mouth daily.        . metoprolol tartrate (LOPRESSOR) 25 MG tablet Take 25 mg by mouth 2 (two) times daily.      . nitroGLYCERIN (NITROSTAT) 0.4 MG SL tablet Place 0.4 mg under the tongue every 5 (five) minutes as needed.      . pantoprazole (PROTONIX) 40 MG tablet Take 40 mg by mouth daily.      . prenatal vitamin w/FE, FA (PRENATAL 1 + 1) 27-1 MG TABS Take 1 tablet by mouth as  needed.       . rizatriptan (MAXALT) 10 MG tablet Take 10 mg by mouth as needed. May repeat in 2 hours if needed       . simvastatin (ZOCOR) 10 MG tablet Take 10 mg by mouth at bedtime.      . SUMAtriptan (IMITREX) 100 MG tablet Take 100 mg by mouth every 2 (two) hours as needed.        . topiramate (TOPAMAX) 50 MG tablet Take 100 mg by mouth 2 (two) times daily.       . ranolazine (RANEXA) 500 MG 12 hr tablet Take 1 tablet (500 mg total) by mouth 2 (two) times daily.  60 tablet  6    BP 118/80  Pulse 81  Ht 5' 5.5" (1.664 m)  Wt 98.884 kg (218 lb)  BMI 35.73 kg/m2 General: NAD, overweight Neck: No JVD, no thyromegaly or thyroid nodule.  Lungs: Clear to auscultation bilaterally with normal respiratory effort. CV: Nondisplaced PMI.  Heart regular S1/S2, no S3/S4, 1/6 SEM.  No peripheral edema.  No carotid bruit.  Normal pedal pulses.  Abdomen: Soft, nontender, no hepatosplenomegaly, no distention.  Neurologic: Alert and oriented x 3.  Psych: Normal affect. Extremities: No clubbing or cyanosis.

## 2011-12-04 NOTE — Patient Instructions (Addendum)
Start ranexa(ranolozine) 500mg  twice a day--try to take it about 12 hours apart.  Your physician recommends that you schedule an appt for an EKG in 2 weeks.  Your physician recommends that you schedule a follow-up appointment in: 2 months with Dr Shirlee Latch.

## 2011-12-06 ENCOUNTER — Encounter: Payer: Self-pay | Admitting: Cardiology

## 2011-12-15 ENCOUNTER — Emergency Department (HOSPITAL_COMMUNITY)
Admission: EM | Admit: 2011-12-15 | Discharge: 2011-12-15 | Disposition: A | Discharge status: Home / Self Care | Payer: Medicare PPO | Attending: Cardiology | Admitting: Cardiology

## 2011-12-15 ENCOUNTER — Emergency Department (HOSPITAL_COMMUNITY): Payer: Medicare PPO

## 2011-12-15 ENCOUNTER — Encounter (HOSPITAL_COMMUNITY): Payer: Self-pay | Admitting: Emergency Medicine

## 2011-12-15 DIAGNOSIS — R079 Chest pain, unspecified: Secondary | ICD-10-CM

## 2011-12-15 DIAGNOSIS — I10 Essential (primary) hypertension: Secondary | ICD-10-CM | POA: Insufficient documentation

## 2011-12-15 DIAGNOSIS — Z79899 Other long term (current) drug therapy: Secondary | ICD-10-CM | POA: Insufficient documentation

## 2011-12-15 DIAGNOSIS — I252 Old myocardial infarction: Secondary | ICD-10-CM | POA: Insufficient documentation

## 2011-12-15 HISTORY — DX: Gastro-esophageal reflux disease without esophagitis: K21.9

## 2011-12-15 HISTORY — DX: Hypothyroidism, unspecified: E03.9

## 2011-12-15 HISTORY — DX: Unspecified asthma, uncomplicated: J45.909

## 2011-12-15 HISTORY — DX: Migraine, unspecified, not intractable, without status migrainosus: G43.909

## 2011-12-15 LAB — CBC WITH DIFFERENTIAL/PLATELET
Basophils Absolute: 0.1 10*3/uL (ref 0.0–0.1)
Basophils Relative: 1 % (ref 0–1)
Eosinophils Absolute: 0.3 10*3/uL (ref 0.0–0.7)
Eosinophils Relative: 6 % — ABNORMAL HIGH (ref 0–5)
HCT: 36 % (ref 36.0–46.0)
Hemoglobin: 12 g/dL (ref 12.0–15.0)
Lymphocytes Relative: 37 % (ref 12–46)
Lymphs Abs: 2 10*3/uL (ref 0.7–4.0)
MCH: 27.5 pg (ref 26.0–34.0)
MCHC: 33.3 g/dL (ref 30.0–36.0)
MCV: 82.4 fL (ref 78.0–100.0)
Monocytes Absolute: 0.4 10*3/uL (ref 0.1–1.0)
Monocytes Relative: 7 % (ref 3–12)
Neutro Abs: 2.7 10*3/uL (ref 1.7–7.7)
Neutrophils Relative %: 49 % (ref 43–77)
Platelets: 247 10*3/uL (ref 150–400)
RBC: 4.37 MIL/uL (ref 3.87–5.11)
RDW: 14.7 % (ref 11.5–15.5)
WBC: 5.5 10*3/uL (ref 4.0–10.5)

## 2011-12-15 LAB — URINALYSIS, ROUTINE W REFLEX MICROSCOPIC
Bilirubin Urine: NEGATIVE
Glucose, UA: NEGATIVE mg/dL
Ketones, ur: NEGATIVE mg/dL
Leukocytes, UA: NEGATIVE
Nitrite: NEGATIVE
Protein, ur: NEGATIVE mg/dL
Specific Gravity, Urine: 1.025 (ref 1.005–1.030)
Urobilinogen, UA: 1 mg/dL (ref 0.0–1.0)
pH: 6.5 (ref 5.0–8.0)

## 2011-12-15 LAB — POCT I-STAT TROPONIN I: Troponin i, poc: 0.01 ng/mL (ref 0.00–0.08)

## 2011-12-15 LAB — BASIC METABOLIC PANEL
BUN: 14 mg/dL (ref 6–23)
CO2: 21 mEq/L (ref 19–32)
Calcium: 8.7 mg/dL (ref 8.4–10.5)
Chloride: 108 mEq/L (ref 96–112)
Creatinine, Ser: 0.85 mg/dL (ref 0.50–1.10)
GFR calc Af Amer: >90 mL/min (ref >90)
GFR calc non Af Amer: 80 mL/min — ABNORMAL LOW (ref >90)
Glucose, Bld: 96 mg/dL (ref 70–99)
Potassium: 3.5 mEq/L (ref 3.5–5.1)
Sodium: 140 mEq/L (ref 135–145)

## 2011-12-15 LAB — URINE MICROSCOPIC-ADD ON

## 2011-12-15 LAB — TROPONIN I: Troponin I: <0.3 ng/mL (ref <0.30)

## 2011-12-15 MED ORDER — ONDANSETRON HCL 4 MG/2ML IJ SOLN
INTRAMUSCULAR | Status: AC
  Filled 2011-12-15: qty 2

## 2011-12-15 MED ORDER — IBUPROFEN 600 MG PO TABS: 600.0000 mg | ORAL_TABLET | Freq: Four times a day (QID) | ORAL | Status: AC | PRN

## 2011-12-15 MED ORDER — ASPIRIN 81 MG PO CHEW
324.0000 mg | CHEWABLE_TABLET | Freq: Once | ORAL | Status: AC
  Administered 2011-12-15: 324 mg via ORAL
  Filled 2011-12-15: qty 4

## 2011-12-15 MED ORDER — ONDANSETRON HCL 4 MG/2ML IJ SOLN
4.0000 mg | Freq: Once | INTRAMUSCULAR | Status: AC
  Administered 2011-12-15: 4 mg via INTRAVENOUS

## 2011-12-15 MED ORDER — NITROGLYCERIN 0.4 MG SL SUBL
0.4000 mg | SUBLINGUAL_TABLET | SUBLINGUAL | Status: DC | PRN
  Administered 2011-12-15 (×2): 0.4 mg via SUBLINGUAL

## 2011-12-15 NOTE — ED Notes (Signed)
Pt is not diaphoretic, no SOB, no dizziness.

## 2011-12-15 NOTE — ED Notes (Signed)
Pt d/c home in [place]. Pt voiced understanding of d/c instructions including follow up care and doubling dose of norvasc. Pt ambulated with quick steady gait.

## 2011-12-15 NOTE — Consult Note (Signed)
CARDIOLOGY CONSULT NOTE  Patient ID: ILIYAH BUI MRN: [id number] DOB/AGE: 12-24-1965 46 y.o.  Admit date: 12/15/2011 Primary Physician Genia Hotter, MD Primary Cardiologist Dr. Shirlee Latch Chief Complaint  Chest pain  HPI:   46 yo presents for evaluation of chest pain.  She has a history of an abnormal stress test in [place] in 2008 followed by a cardiac cath, which she says was normal.  ETT-myoview in our office in 11/12 showed ischemic-type ST depression but no evidence for ischemia on myoview images. EF was 66%.  Coronary CT angiogram for continued pain was ordered. This demonsrated no coronary calcium and no definite stenosis was seen (though mid-RCA was not fully evaluated due to artifact).  In May of this year she went to an ER in [place] with chest pain.   Troponin was mildly elevated with normal CKMB. She had a left heart cath which showed no angiographic CAD. She was sent home on Imdur and amlodipine. Echo showed normal EF. Since discharge, she continued to have her chronic chest pain.   She saw Dr. Shirlee Latch on 7/1.  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  He added ranolazine to her regimen which includes Norvasc and Imdur.  She presents today with similar to the pain she had in [place].  She had a name discomfort most of yesterday. This morning it was more intense. His gripping discomfort. It was 6/10 at its peak. He was some nausea and vomiting. He was mildly short of breath. She didn't have any radiation to her jaw or to her arm. She wasn't diaphoretic. She got also this might have been similar for reflux. However, she was concerned because the last time she had this for enzymes were elevated. She did take nitroglycerin x2 without improvement. Here in the emergency room she's had aspirin nitroglycerin and Zofran. Her symptoms are improved though she still mildly a cough. She chronically sleeps on 3 pillows. She's not describing PND or orthopnea.  EKG was unremarkable. First  set of his enzymes are negative.  Past Medical History  Diagnosis Date  . HTN (hypertension)   . Seizure disorder     since childhood.  . Chest pain 05/15/11, 05/16/11  . Migraine   . Asthma   . Hypothyroid   . GERD (gastroesophageal reflux disease)     Past Surgical History  Procedure Date  . Cardiac catheterization     [facility]   . Cesarean section     placement of vagal nerve stimulator.    Allergies  Allergen Reactions  . Amitriptyline Swelling  . Latex Itching and Swelling    Whelps, burning    Current Outpatient Prescriptions on File Prior to Encounter  Medication Sig Dispense Refill  . amLODipine (NORVASC) 2.5 MG tablet Take 2.5 mg by mouth daily.      . Fluticasone-Salmeterol (ADVAIR DISKUS) 250-50 MCG/DOSE AEPB Inhale one puff into lungs twice a day  60 each  6  . isosorbide mononitrate (IMDUR) 60 MG 24 hr tablet Take 120 mg by mouth 2 (two) times daily.       Marland Kitchen levETIRAcetam (KEPPRA) 500 MG tablet Take 500 mg by mouth 2 (two) times daily.      Marland Kitchen levothyroxine (SYNTHROID, LEVOTHROID) 50 MCG tablet Take 50 mcg by mouth daily.        . metoprolol tartrate (LOPRESSOR) 25 MG tablet Take 25 mg by mouth 2 (two) times daily.      . nitroGLYCERIN (NITROSTAT) 0.4 MG SL tablet Place 0.4 mg  under the tongue every 5 (five) minutes as needed.      Marland Kitchen omeprazole (PRILOSEC) 20 MG capsule Take 40 mg by mouth daily.      . pantoprazole (PROTONIX) 40 MG tablet Take 40 mg by mouth daily.      . prenatal vitamin w/FE, FA (PRENATAL 1 + 1) 27-1 MG TABS Take 1 tablet by mouth as needed.       . ranolazine (RANEXA) 500 MG 12 hr tablet Take 1 tablet (500 mg total) by mouth 2 (two) times daily.  60 tablet  6  . rizatriptan (MAXALT) 10 MG tablet Take 10 mg by mouth as needed. For migraines.    May repeat in 2 hours if needed      . simvastatin (ZOCOR) 10 MG tablet Take 10 mg by mouth at bedtime.      . SUMAtriptan (IMITREX) 100 MG tablet Take 100 mg by mouth every 2 (two) hours  as needed. For migraines      . topiramate (TOPAMAX) 50 MG tablet Take 100 mg by mouth 2 (two) times daily.         Family History  Problem Relation Age of Onset  . Coronary artery disease Mother 40  . Coronary artery disease Maternal Grandmother     History   Social History  . Marital Status: Single    Spouse Name: N/A    Number of Children: 2  . Years of Education: N/A   Occupational History  . Not on file.   Social History Main Topics  . Smoking status: Never Smoker   . Smokeless tobacco: Not on file  . Alcohol Use: Not on file  . Drug Use: Not on file  . Sexually Active: Not on file   Other Topics Concern  . Not on file   Social History Narrative   Lives alone     ROS:  As stated in the HPI and negative for all other systems. . Physical Exam: Blood pressure 125/86, pulse 74, temperature 98.3 F (36.8 C), temperature source Oral, resp. rate 18, last menstrual period 12/08/2011, SpO2 100.00%.  GENERAL:  Well appearing HEENT:  Pupils equal round and reactive, fundi not visualized, oral mucosa unremarkable NECK:  No jugular venous distention, waveform within normal limits, carotid upstroke brisk and symmetric, no bruits, no thyromegaly LYMPHATICS:  No cervical, inguinal adenopathy LUNGS:  Clear to auscultation bilaterally BACK:  No CVA tenderness CHEST:  Unremarkable HEART:  PMI not displaced or sustained,S1 and S2 within normal limits, no S3, no S4, no clicks, no rubs, no murmurs ABD:  Flat, positive bowel sounds normal in frequency in pitch, no bruits, no rebound, no guarding, no midline pulsatile mass, no hepatomegaly, no splenomegaly EXT:  2 plus pulses throughout, no edema, no cyanosis no clubbing SKIN:  No rashes no nodules NEURO:  Cranial nerves II through XII grossly intact, motor grossly intact throughout PSYCH:  Cognitively intact, oriented to person place and time  Labs: Lab Results  Component Value Date   BUN 14 12/15/2011   Lab Results  Component  Value Date   CREATININE 0.85 12/15/2011   Lab Results  Component Value Date   NA 140 12/15/2011   K 3.5 12/15/2011   CL 108 12/15/2011   CO2 21 12/15/2011    Lab Results  Component Value Date   WBC 5.5 12/15/2011   HGB 12.0 12/15/2011   HCT 36.0 12/15/2011   MCV 82.4 12/15/2011   PLT 247 12/15/2011   Lab Results  Component Value Date   CHOL 192 05/02/2011   HDL 49.20 05/02/2011   LDLCALC 130* 05/02/2011   TRIG 62.0 05/02/2011   CHOLHDL 4 05/02/2011    Radiology:   CXR:  No acute pulmonary disease.  EKG:   Sinus rhythm, rate 84, axis within normal limits, intervals within normal limits, no acute ST-T wave changes.  ASSESSMENT AND PLAN:   Chest pain:  At this point the patient has no objective evidence of ischemia. The first set of enzymes are negative. She does not want to stay in the hospital if her enzymes are negative and no further intervention or testing is planned. I have asked the ER to repeat enzymes. If negative she could be discharged with 5 mg of Norvasc. She needs a renewal of her sublingual nitroglycerin. I will arrange followup in our office next week.  HTN:  Her blood pressure is controlled in the context of treating her chest pain.  Hyperlipidemia:  Of note I received records from [place] confirming normal coronaries. This also demonstrated an LDL of 91 and HDL of 49 which is a significant improvement compared with the results above. No change in therapy is indicated.  SignedRollene Rotunda 12/15/2011, 3:22 PM

## 2011-12-15 NOTE — ED Notes (Signed)
Pt c/o midsternal CP starting last night with SOB and nausea; pt sts hx of similar in past

## 2011-12-15 NOTE — ED Provider Notes (Signed)
History     CSN: [id number]  Arrival date & time 12/15/11  447 46 year old female coming in with midsternal chest pressure that is intermittent and worse with exertion. Patient was cathed in [place] one month ago and the Was clean. Patient has seen Dr. Shirlee Latch in the main meantime   First MD Initiated Contact with Patient 12/15/11 1133      Chief Complaint  Patient presents with  . Chest Pain    (Consider location/radiation/quality/duration/timing/severity/associated sxs/prior treatment) Patient is a 46 y.o. female presenting with chest pain. The history is provided by the patient. No language interpreter was used.  Chest Pain Primary symptoms include nausea. Pertinent negatives for primary symptoms include no shortness of breath, no cough, no abdominal pain, no vomiting and no dizziness.   46 year old female coming in with midsternal chest pressure that is intermittent and worse with exertion. Patient was cathed in [place] one month ago with no CAD. Patient was also cathed  in 2008 and it was clean. Recent 2-D echo was unremarkable. Patient has seen Dr. Shirlee Latch in the meantime. Patient does have of left subclavian nerve stimulator/pacer per patient. Dr. Shirlee Latch recently started her on renexa.  Patient took 2 nitros at home with no relief. After ambulating to the bathroom in the ER she received another nitroglycerin for her exertional chest pain which was unrelieved. Patient has an appointment on July 25 and was the same with the addition of the nerve stimulator/pacer in her left subclavian. Per patient the battery is running low. While talking to the patient she felt like she was going to have a seizure. Then she kept repeating I think I think I think I think. Patient did not have a seizure and she is acting normally now. pmh listed below.   Past Medical History  Diagnosis Date  . HTN (hypertension)   . Seizure disorder     since childhood.  . Chest pain 05/15/11, 05/16/11  . MI (myocardial  infarction)     Past Surgical History  Procedure Date  . Cardiac catheterization     [facility] [place]   . Cesarean section     placement of vagal nerve stimulator.    History reviewed. No pertinent family history.  History  Substance Use Topics  . Smoking status: Never Smoker   . Smokeless tobacco: Not on file  . Alcohol Use: Not on file    OB History    Grav Para Term Preterm Abortions TAB SAB Ect Mult Living                  Review of Systems  Constitutional: Negative.   HENT: Negative.   Eyes: Negative.   Respiratory: Positive for chest tightness. Negative for cough and shortness of breath.   Cardiovascular: Positive for chest pain.  Gastrointestinal: Positive for nausea. Negative for vomiting, abdominal pain and diarrhea.  Neurological: Negative.  Negative for dizziness, light-headedness and headaches.  Psychiatric/Behavioral: Negative.   All other systems reviewed and are negative.    Allergies  Amitriptyline and Latex  Home Medications   Current Outpatient Rx  Name Route Sig Dispense Refill  . AMLODIPINE BESYLATE 2.5 MG PO TABS Oral Take 2.5 mg by mouth daily.    Marland Kitchen FLUTICASONE-SALMETEROL 250-50 MCG/DOSE IN AEPB  Inhale one puff into lungs twice a day 60 each 6    inhale one puff into lungs  twice a day  . ISOSORBIDE MONONITRATE ER 60 MG PO TB24 Oral Take 120 mg by mouth 2 (two) times  daily.     Marland Kitchen LEVETIRACETAM 500 MG PO TABS Oral Take 500 mg by mouth 2 (two) times daily.    Marland Kitchen LEVOTHYROXINE SODIUM 50 MCG PO TABS Oral Take 50 mcg by mouth daily.      Marland Kitchen METOPROLOL TARTRATE 25 MG PO TABS Oral Take 25 mg by mouth 2 (two) times daily.    Marland Kitchen NITROGLYCERIN 0.4 MG SL SUBL Sublingual Place 0.4 mg under the tongue every 5 (five) minutes as needed.    Marland Kitchen OMEPRAZOLE 20 MG PO CPDR Oral Take 40 mg by mouth daily.    Marland Kitchen PANTOPRAZOLE SODIUM 40 MG PO TBEC Oral Take 40 mg by mouth daily.    Marland Kitchen PRENATAL PLUS 27-1 MG PO TABS Oral Take 1 tablet by mouth as needed.     Marland Kitchen  RANOLAZINE ER 500 MG PO TB12 Oral Take 1 tablet (500 mg total) by mouth 2 (two) times daily. 60 tablet 6  . RIZATRIPTAN BENZOATE 10 MG PO TABS Oral Take 10 mg by mouth as needed. For migraines.    May repeat in 2 hours if needed    . SIMVASTATIN 10 MG PO TABS Oral Take 10 mg by mouth at bedtime.    . SUMATRIPTAN SUCCINATE 100 MG PO TABS Oral Take 100 mg by mouth every 2 (two) hours as needed. For migraines    . TOPIRAMATE 50 MG PO TABS Oral Take 100 mg by mouth 2 (two) times daily.       BP 125/86  Pulse 74  Temp 98.3 F (36.8 C) (Oral)  Resp 18  SpO2 100%  LMP 12/08/2011  Physical Exam  Nursing note and vitals reviewed. Constitutional: She is oriented to person, place, and time. She appears well-developed and well-nourished.  HENT:  Head: Normocephalic and atraumatic.  Eyes: Conjunctivae and EOM are normal. Pupils are equal, round, and reactive to light.  Neck: Normal range of motion. Neck supple.  Cardiovascular: Normal rate.   Pulmonary/Chest: Effort normal and breath sounds normal. No respiratory distress. She has no wheezes.  Abdominal: Soft. Bowel sounds are normal. She exhibits no distension.  Musculoskeletal: Normal range of motion. She exhibits no edema and no tenderness.  Neurological: She is alert and oriented to person, place, and time. She has normal reflexes.  Skin: Skin is warm and dry.  Psychiatric: She has a normal mood and affect.    ED Course  Procedures (including critical care time)  Labs Reviewed  CBC WITH DIFFERENTIAL - Abnormal; Notable for the following:    Eosinophils Relative 6 (*)     All other components within normal limits  BASIC METABOLIC PANEL - Abnormal; Notable for the following:    GFR calc non Af Amer 80 (*)     All other components within normal limits  URINALYSIS, ROUTINE W REFLEX MICROSCOPIC - Abnormal; Notable for the following:    Hgb urine dipstick TRACE (*)     All other components within normal limits  URINE MICROSCOPIC-ADD ON -  Abnormal; Notable for the following:    Casts HYALINE CASTS (*)     All other components within normal limits  POCT I-STAT TROPONIN I   Dg Chest Port 1 View  12/15/2011  *RADIOLOGY REPORT*  Clinical Data: Mid left sided chest pain  PORTABLE CHEST - 1 VIEW  Comparison: 02/27/2008; cardiac CTA - 05/03/2011  Findings: Normal cardiac silhouette and mediastinal contours. Grossly stable positioning of left anterior chest wall vagal nerve stimulator. Grossly unchanged minimal bibasilar heterogeneous opacities, left greater than  right, favored to represent atelectasis.  No new focal airspace opacities.  No definite pleural effusion or pneumothorax.  Unchanged bones.  IMPRESSION: No acute cardiopulmonary disease.  Original Report Authenticated By: Waynard Reeds, M.D.     No diagnosis found.    MDM  Mid sternal cp especially with exertion x over 1 month.  Clean cath in [place] recently.  Being followed by Dr. Shirlee Latch with [facility].  Was seen in the ER today by [facility].  Recommend increasing norvasc to 10mg  /day with follow up next week with Dr. Jearld Pies.  Return to ER if worse. EKG, trop x2 and chest x-ray unremarkable today.     Labs Reviewed  CBC WITH DIFFERENTIAL - Abnormal; Notable for the following:    Eosinophils Relative 6 (*)     All other components within normal limits  BASIC METABOLIC PANEL - Abnormal; Notable for the following:    GFR calc non Af Amer 80 (*)     All other components within normal limits  URINALYSIS, ROUTINE W REFLEX MICROSCOPIC - Abnormal; Notable for the following:    Hgb urine dipstick TRACE (*)     All other components within normal limits  URINE MICROSCOPIC-ADD ON - Abnormal; Notable for the following:    Casts HYALINE CASTS (*)     All other components within normal limits  POCT I-STAT TROPONIN I  TROPONIN I  LAB REPORT - SCANNED          Remi Haggard, NP 12/16/11 1207

## 2011-12-15 NOTE — ED Notes (Signed)
Patient refused bed pan and walked herself to the bathroom.

## 2011-12-15 NOTE — ED Notes (Signed)
Pt states "I felt like I was having indigestion last night and then it turned into chest pain. The chest pain was so bad and began increasing to the point where I had to take nitro. I took 2 nitro before I got here but it did not help with the pain." Pt c/o n/vx2. Pt states she had an MI on June 26.

## 2011-12-18 ENCOUNTER — Telehealth: Payer: Self-pay | Admitting: Cardiology

## 2011-12-18 ENCOUNTER — Ambulatory Visit: Payer: Medicare PPO

## 2011-12-18 ENCOUNTER — Other Ambulatory Visit: Payer: Self-pay | Admitting: *Deleted

## 2011-12-18 MED ORDER — NITROGLYCERIN 0.4 MG SL SUBL: 0.4000 mg | SUBLINGUAL_TABLET | SUBLINGUAL | Status: DC | PRN

## 2011-12-18 NOTE — Telephone Encounter (Signed)
Dr Shirlee Latch reviewed EKG done at hospital 12/15/11. Pt does not need EKG today. Pt is aware she does not need EKG today.

## 2011-12-18 NOTE — Telephone Encounter (Signed)
New problem:  Patient has appt today for EKG. Went to emergency room on Saturday. EKG was performed. Was told by Dr. Antoine Poche that EKG was normal & cardiac enzymes was normal. Increase Norvasc 10 mg. Please advise

## 2011-12-22 NOTE — ED Provider Notes (Signed)
Medical screening examination/treatment/procedure(s) were performed by non-physician practitioner and as supervising physician I was immediately available for consultation/collaboration.  Derwood Kaplan, MD 12/22/11 [phone]

## 2011-12-29 DIAGNOSIS — E785 Hyperlipidemia, unspecified: Secondary | ICD-10-CM | POA: Insufficient documentation

## 2011-12-29 DIAGNOSIS — G40919 Epilepsy, unspecified, intractable, without status epilepticus: Secondary | ICD-10-CM | POA: Insufficient documentation

## 2012-01-05 ENCOUNTER — Other Ambulatory Visit: Payer: Self-pay | Admitting: Cardiology

## 2012-02-07 ENCOUNTER — Encounter: Payer: Self-pay | Admitting: Cardiology

## 2012-02-07 ENCOUNTER — Ambulatory Visit (INDEPENDENT_AMBULATORY_CARE_PROVIDER_SITE_OTHER): Payer: Medicare PPO | Admitting: Cardiology

## 2012-02-07 VITALS — BP 120/80 | HR 60 | Ht 65.5 in | Wt 222.0 lb

## 2012-02-07 DIAGNOSIS — I208 Other forms of angina pectoris: Secondary | ICD-10-CM

## 2012-02-07 DIAGNOSIS — R079 Chest pain, unspecified: Secondary | ICD-10-CM

## 2012-02-07 DIAGNOSIS — E785 Hyperlipidemia, unspecified: Secondary | ICD-10-CM

## 2012-02-07 DIAGNOSIS — I209 Angina pectoris, unspecified: Secondary | ICD-10-CM

## 2012-02-07 MED ORDER — AMLODIPINE BESYLATE 5 MG PO TABS: 5.0000 mg | ORAL_TABLET | Freq: Every day | ORAL | Status: DC

## 2012-02-07 NOTE — Patient Instructions (Signed)
Increase amlodipine to 5mg  daily. You can take two 2.5mg  tablets daily at the same time and use your current supply.  You have been referred to Cardiac Rehab at [facility].  You have been referred to a Nutritionist for heart healthy diet and weight loss.  Your physician recommends that you schedule a follow-up appointment in: 2 months with Dr Shirlee Latch.

## 2012-02-07 NOTE — Progress Notes (Signed)
Patient ID: [name], female   DOB: 03-08-1966, 46 y.o.   MRN: [id number] PCP: Dr. Parke Simmers [facility]), Dr. Clydie Braun Lacy Duverney)  46 y.o. with history of chest pain and prior negative caths as well as migraines and HTN presents for cardiology followup.  She has had chest pain chronically over a number of years.  She had an abnormal stress test in [place] in 2008.  She was then sent for a cardiac cath, which she says was normal.  We had her do an ETT-myoview in this office in 11/12.  This showed ischemic-type ST depression on exercise ECG, but there was no evidence for ischemia on myoview images.  EF was 66%.  Given ongoing chest pain and ischemic ECG changes with normal perfusion images (equivocal study), I had her get a coronary CT angiogram.  This was a difficult study because we were not able to get her HR as low as would be ideal.  However, she had no coronary calcium and no definite stenosis was seen (though mid-RCA was not fully evaluated due to artifact).  I think that this was a negative study.    She has continued to have chest tightness with walking long distances, climbing a flight of steps, and housework.  She is short of breath with these activities.  In 5/13, she went to the ER in [place] with particularly bad exertional chest pain that lasted a prolonged time.  Troponin was mildly elevated with normal CKMB.  She had a left heart cath in the hospital and [place] which showed no angiographic CAD.  She was sent home on Imdur and amlodipine.  Echo showed normal EF.  She went to the ER at [facility] in 7/13 with chest pain not long after her last appointment with me.  Cardiac enzymes were negative and she was sent home.  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  She seems to have improved somewhat but still has chest pain with heavy exertion and still occasionally uses NTG.   Labs (9/12): creatinine 0.9 Labs (11/12): LDL 130, HDL 49, TSH normal, K  3.6, creatinine 0.8, Lp(a) 31 Labs (5/13): K 4, creatinine 0.7, LDL 91, TnI 0.86 => 0.98 with normal CKMB Labs (7/13): negative troponin  ECG: NSR, normal (QTc normal).   PMH:  1. Chest pain: Multiple episodes over the years.  She had an abnormal stress test in 2008 in [place] and it sounds like she went to [facility] for a cardiac catheterization which per her report was normal.   Stress echo at [facility] in 2010 was submaximal but showed no evidence for ischemia.  ETT-myoview (11/12): 7'15", EF 66%, significant ST depression on exercise ECG, apical thinning on myoview images but no evidence for ischemia. Coronary CT angiogram (11/12): calcium score 0, difficult study with gating artifact, mid RCA nonevaluatable but no stenosis elsewhere in the coronaries.   Echo (11/12): EF 55-60%, mild LV hypertrophy, mild MR.  NSTEMI in [place] in 5/13 with elevated troponin but LHC showed normal coronaries.  Echo (5/13) with EF 60-65%, normal valve, no LVH.  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  2. Migraines: Uses triptan medications.  3. HTN 4. Seizure disorder: Has vagus nerve stimulator.  5. Hypothyroidism 6. Asthma since childhood  SH: Moved to [place] from [place].  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   FH: Mother with MI at 48.  Grandmother with MI at 48.  ROS: All systems reviewed and negative except as per HPI.   Current Outpatient Prescriptions  Medication Sig Dispense Refill  . Fluticasone-Salmeterol (ADVAIR DISKUS) 250-50 MCG/DOSE AEPB Inhale one puff into lungs twice a day  60 each  6  . isosorbide mononitrate (IMDUR) 60 MG 24 hr tablet TAKE 1 TABLET (60 MG TOTAL) BY MOUTH 2 (TWO) TIMES DAILY.  60 tablet  6  . levETIRAcetam (KEPPRA) 500 MG tablet Take 500 mg by mouth 2 (two) times daily.      Marland Kitchen levothyroxine (SYNTHROID, LEVOTHROID) 50 MCG tablet Take 50 mcg by mouth daily.        . metoprolol tartrate (LOPRESSOR) 25 MG tablet Take  25 mg by mouth 2 (two) times daily.      . nitroGLYCERIN (NITROSTAT) 0.4 MG SL tablet Place 1 tablet (0.4 mg total) under the tongue every 5 (five) minutes as needed.  100 tablet  3  . omeprazole (PRILOSEC) 20 MG capsule Take 40 mg by mouth daily.      . ranolazine (RANEXA) 500 MG 12 hr tablet Take 1 tablet (500 mg total) by mouth 2 (two) times daily.  60 tablet  6  . rizatriptan (MAXALT) 10 MG tablet Take 10 mg by mouth as needed. For migraines.    May repeat in 2 hours if needed      . simvastatin (ZOCOR) 10 MG tablet Take 10 mg by mouth at bedtime.      . SUMAtriptan (IMITREX) 100 MG tablet Take 100 mg by mouth every 2 (two) hours as needed. For migraines      . topiramate (TOPAMAX) 50 MG tablet Take 100 mg by mouth 2 (two) times daily.       Marland Kitchen DISCONTD: isosorbide mononitrate (IMDUR) 60 MG 24 hr tablet Take 120 mg by mouth 2 (two) times daily.       Marland Kitchen amLODipine (NORVASC) 5 MG tablet Take 1 tablet (5 mg total) by mouth daily.  30 tablet  6  . Coenzyme Q10 200 MG TABS Take by mouth.    0  . DISCONTD: amLODipine (NORVASC) 2.5 MG tablet Take 2.5 mg by mouth daily.        BP 120/80  Pulse 60  Ht 5' 5.5" (1.664 m)  Wt 222 lb (100.699 kg)  BMI 36.38 kg/m2 General: NAD, overweight Neck: No JVD, no thyromegaly or thyroid nodule.  Lungs: Clear to auscultation bilaterally with normal respiratory effort. CV: Nondisplaced PMI.  Heart regular S1/S2, no S3/S4, 1/6 SEM.  No peripheral edema.  No carotid bruit.  Normal pedal pulses.  Abdomen: Soft, nontender, no hepatosplenomegaly, no distention.  Neurologic: Alert and oriented x 3.  Psych: Normal affect. Extremities: No clubbing or cyanosis.

## 2012-02-07 NOTE — Assessment & Plan Note (Signed)
Patient continues to have chest pain with exertion though she has seen some improvement with ranolazine.  Left heart cath in 5/13 showed normal coronaries.  I had her do a stress myoview last year with ischemic-type ST depression but normal perfusion images.  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   - Continue ranolazine and Imdur.  - I will increase amlodipine to 5 mg daily.   - Regular exercise may help symptoms.  I will see if I can enroll her in cardiac rehab.

## 2012-02-07 NOTE — Assessment & Plan Note (Addendum)
LDL 91 in 5/13 on simvastatin.   Mrs Loflin wants a nutrition referral for weight loss, which I will provide.

## 2012-02-15 ENCOUNTER — Ambulatory Visit (HOSPITAL_COMMUNITY): Payer: Medicare PPO

## 2012-02-19 ENCOUNTER — Ambulatory Visit (HOSPITAL_COMMUNITY): Payer: Medicare PPO

## 2012-02-21 ENCOUNTER — Ambulatory Visit (HOSPITAL_COMMUNITY): Payer: Medicare PPO

## 2012-02-22 ENCOUNTER — Ambulatory Visit (HOSPITAL_COMMUNITY): Payer: Medicare PPO

## 2012-02-23 ENCOUNTER — Ambulatory Visit (HOSPITAL_COMMUNITY): Payer: Medicare PPO

## 2012-02-26 ENCOUNTER — Ambulatory Visit (HOSPITAL_COMMUNITY): Payer: Medicare PPO

## 2012-02-26 ENCOUNTER — Ambulatory Visit: Payer: Medicare PPO | Admitting: *Deleted

## 2012-02-26 ENCOUNTER — Encounter (HOSPITAL_COMMUNITY): Payer: Medicare PPO

## 2012-02-28 ENCOUNTER — Ambulatory Visit (HOSPITAL_COMMUNITY): Payer: Medicare PPO

## 2012-02-28 ENCOUNTER — Encounter (HOSPITAL_COMMUNITY): Payer: Medicare PPO

## 2012-03-01 ENCOUNTER — Encounter (HOSPITAL_COMMUNITY): Payer: Medicare PPO

## 2012-03-01 ENCOUNTER — Ambulatory Visit (HOSPITAL_COMMUNITY): Payer: Medicare PPO

## 2012-03-04 ENCOUNTER — Encounter (HOSPITAL_COMMUNITY): Payer: Medicare PPO

## 2012-03-04 ENCOUNTER — Ambulatory Visit (HOSPITAL_COMMUNITY): Payer: Medicare PPO

## 2012-03-06 ENCOUNTER — Encounter (HOSPITAL_COMMUNITY): Payer: Medicare PPO

## 2012-03-06 ENCOUNTER — Ambulatory Visit (HOSPITAL_COMMUNITY): Payer: Medicare PPO

## 2012-03-06 ENCOUNTER — Other Ambulatory Visit: Payer: Self-pay | Admitting: *Deleted

## 2012-03-06 MED ORDER — SIMVASTATIN 10 MG PO TABS: 10.0000 mg | ORAL_TABLET | Freq: Every day | ORAL | Status: DC

## 2012-03-07 ENCOUNTER — Encounter (HOSPITAL_COMMUNITY)
Admission: RE | Admit: 2012-03-07 | Discharge: 2012-03-07 | Disposition: A | Discharge status: Home / Self Care | Payer: Medicare PPO | Source: Ambulatory Visit | Attending: Cardiology | Admitting: Cardiology

## 2012-03-07 DIAGNOSIS — J45909 Unspecified asthma, uncomplicated: Secondary | ICD-10-CM | POA: Insufficient documentation

## 2012-03-07 DIAGNOSIS — R569 Unspecified convulsions: Secondary | ICD-10-CM | POA: Insufficient documentation

## 2012-03-07 DIAGNOSIS — Z8249 Family history of ischemic heart disease and other diseases of the circulatory system: Secondary | ICD-10-CM | POA: Insufficient documentation

## 2012-03-07 DIAGNOSIS — E669 Obesity, unspecified: Secondary | ICD-10-CM | POA: Insufficient documentation

## 2012-03-07 DIAGNOSIS — K219 Gastro-esophageal reflux disease without esophagitis: Secondary | ICD-10-CM | POA: Insufficient documentation

## 2012-03-07 DIAGNOSIS — Z823 Family history of stroke: Secondary | ICD-10-CM | POA: Insufficient documentation

## 2012-03-07 DIAGNOSIS — I10 Essential (primary) hypertension: Secondary | ICD-10-CM | POA: Insufficient documentation

## 2012-03-07 DIAGNOSIS — E785 Hyperlipidemia, unspecified: Secondary | ICD-10-CM | POA: Insufficient documentation

## 2012-03-07 DIAGNOSIS — E039 Hypothyroidism, unspecified: Secondary | ICD-10-CM | POA: Insufficient documentation

## 2012-03-07 DIAGNOSIS — I2510 Atherosclerotic heart disease of native coronary artery without angina pectoris: Secondary | ICD-10-CM | POA: Insufficient documentation

## 2012-03-07 DIAGNOSIS — Z5189 Encounter for other specified aftercare: Secondary | ICD-10-CM | POA: Insufficient documentation

## 2012-03-07 DIAGNOSIS — R0789 Other chest pain: Secondary | ICD-10-CM | POA: Insufficient documentation

## 2012-03-07 DIAGNOSIS — I209 Angina pectoris, unspecified: Secondary | ICD-10-CM | POA: Insufficient documentation

## 2012-03-07 NOTE — Progress Notes (Signed)
Cardiac Rehab Medication Review by a Pharmacist  Does the patient  feel that his/her medications are working for him/her?  yes  Has the patient been experiencing any side effects to the medications prescribed?  yes  Does the patient measure his/her own blood pressure or blood glucose at home?  no   Does the patient have any problems obtaining medications due to transportation or finances?   no  Understanding of regimen: good Understanding of indications: good Potential of compliance: good    Pharmacist comments: Pt is a good historian and keeps a log of her medications. She takes an active role in her own care and was a pleasure to work with. She is experiencing some myalgia likely due to the simvastatin. She has not tried the CoQ10 yet, but I told her to inquire about a different statin that is lower risk like pravastatin or rosuvastatin if covered by insurance if the myalgia does not subside.   Vania Rea. Darin Engels.D. Clinical Pharmacist Pager [phone] Phone [phone] 03/07/2012 9:27 AM

## 2012-03-08 ENCOUNTER — Ambulatory Visit (HOSPITAL_COMMUNITY): Payer: Medicare PPO

## 2012-03-08 ENCOUNTER — Encounter (HOSPITAL_COMMUNITY): Payer: Medicare PPO

## 2012-03-11 ENCOUNTER — Encounter (HOSPITAL_COMMUNITY): Payer: Medicare PPO

## 2012-03-11 ENCOUNTER — Ambulatory Visit (HOSPITAL_COMMUNITY): Payer: Medicare PPO

## 2012-03-11 ENCOUNTER — Telehealth (HOSPITAL_COMMUNITY): Payer: Self-pay | Admitting: *Deleted

## 2012-03-11 NOTE — Telephone Encounter (Signed)
Returned call to pt from message left earlier for rehab staff.  Pt opted to stay home today and not start rehab.  Pt started her menarche on Thursday and had a seizure on Sunday.  Pt plans to start exercise on Wednesday.  Cautioned pt that if she did not feel up to exercise on Wednesday she could start on Friday.

## 2012-03-13 ENCOUNTER — Telehealth: Payer: Self-pay | Admitting: Cardiology

## 2012-03-13 ENCOUNTER — Encounter (HOSPITAL_COMMUNITY)
Admission: RE | Admit: 2012-03-13 | Discharge: 2012-03-13 | Disposition: A | Discharge status: Home / Self Care | Payer: Medicare PPO | Source: Ambulatory Visit | Attending: Cardiology | Admitting: Cardiology

## 2012-03-13 ENCOUNTER — Encounter (HOSPITAL_COMMUNITY): Payer: Self-pay

## 2012-03-13 ENCOUNTER — Ambulatory Visit (HOSPITAL_COMMUNITY): Payer: Medicare PPO

## 2012-03-13 NOTE — Telephone Encounter (Signed)
New Problem:    Called in wanting to let you know that the patient did have some chest pain with exercise.  Please call back.

## 2012-03-13 NOTE — Telephone Encounter (Signed)
Spoke with [name] from Cardiac Rehab. FYI --today was her first of Cardiac Rehab. During exercise she had burning in her chest that was typical for her. She rated it 3 out of 10. She was given 2 NTG with relief. Pt left Cardiac Rehab symptom free. I will forward to Dr Shirlee Latch for review.

## 2012-03-13 NOTE — Progress Notes (Signed)
Pt started cardiac rehab today. VSS, telemetry-NSR.   Pt experienced chest discomfort during 2nd station while walking track, described as chest burning, rates 3/10.  Typical symptom for her. 916am -BP:  136/70.  NTG SL x1 given.  923am rates 2/10 BP:  128/70 another NTG SL x1 given.   Chest pain relieved with NTG SL x2. Pt states she did not take NTG prior to exercise today as previously instructed, however she did use her inhaler.  Pt attempted to resume participation during cool down however felt a little lightheaded upon standing.  BP:  114/78Pt given gatorade to drink.  Symptoms resolved.  BP:  110/72 sitting, 114/74 standing.  Pt reports complete relief of all symptoms prior to leaving cardiac rehab.    Dr. Alford Highland nurse made aware of pt symptoms.  Telephone message sent to Dr. Shirlee Latch to review.

## 2012-03-13 NOTE — Telephone Encounter (Signed)
That is ok.  She will likely get this chest pain from microvascular angina.

## 2012-03-14 ENCOUNTER — Telehealth: Payer: Self-pay | Admitting: Cardiology

## 2012-03-14 NOTE — Telephone Encounter (Signed)
I have reviewed with Dr Shirlee Latch. He did not have any new recommendations. Pt aware.

## 2012-03-14 NOTE — Telephone Encounter (Signed)
plz return call to patient [phone] regarding chest pains during cardiac rehab.

## 2012-03-14 NOTE — Telephone Encounter (Signed)
Spoke with patient. Pt had chest pain at Cardiac Rehab yesterday--see phone note 03/13/12.  Pt states when she got home from Cardiac Rehab yesterday she had chest pain again. She took 2 NTG without relief of chest pain. Pt states she has had almost constant chest pain since yesterday-1 time associated with shoulder pain and 1 time associated with face pain. Pt states she was weak all day yesterday and today. Pt denies N / V. Pt had dizziness X 2 yesterday that she associates with taking NTG.  Yesterday her pain was 5 out of 10. Today her pain is a 4 out of 10. Pt is aware that I am going to forward this information to Dr Shirlee Latch for review and recommendations.

## 2012-03-15 ENCOUNTER — Encounter (HOSPITAL_COMMUNITY)
Admission: RE | Admit: 2012-03-15 | Discharge: 2012-03-15 | Disposition: A | Discharge status: Home / Self Care | Payer: Medicare PPO | Source: Ambulatory Visit | Attending: Cardiology | Admitting: Cardiology

## 2012-03-15 ENCOUNTER — Ambulatory Visit (HOSPITAL_COMMUNITY): Payer: Medicare PPO

## 2012-03-18 ENCOUNTER — Encounter (HOSPITAL_COMMUNITY)
Admission: RE | Admit: 2012-03-18 | Discharge: 2012-03-18 | Disposition: A | Discharge status: Home / Self Care | Payer: Medicare PPO | Source: Ambulatory Visit | Attending: Cardiology | Admitting: Cardiology

## 2012-03-18 ENCOUNTER — Ambulatory Visit (HOSPITAL_COMMUNITY): Payer: Medicare PPO

## 2012-03-19 ENCOUNTER — Encounter: Payer: Medicare PPO | Attending: Cardiology | Admitting: *Deleted

## 2012-03-19 ENCOUNTER — Encounter: Payer: Self-pay | Admitting: *Deleted

## 2012-03-19 VITALS — Ht 65.5 in | Wt 231.2 lb

## 2012-03-19 DIAGNOSIS — R634 Abnormal weight loss: Secondary | ICD-10-CM

## 2012-03-19 DIAGNOSIS — E669 Obesity, unspecified: Secondary | ICD-10-CM | POA: Insufficient documentation

## 2012-03-19 DIAGNOSIS — Z713 Dietary counseling and surveillance: Secondary | ICD-10-CM | POA: Insufficient documentation

## 2012-03-19 NOTE — Patient Instructions (Addendum)
Forget all diet advice Reject "diet mentality" - there are not good foods and no bad food.  Food is fuel Tune into body's hunger cues.  Rate on scale of 0-5 Tune into emotions- how are you feeling at that tome. What food sounds good to you, what will be tasetful and appealing and satisfying Minimize distractions at meals- sit down and eat as family Try for meals to last 20-25 minutes.  Savor each bits, taste it chew, it enjoy it.  Take sips of dink in between bites Eat whatever food and however much you want or need, check in with self how hunger level and emotional level

## 2012-03-19 NOTE — Progress Notes (Signed)
Medical Nutrition Therapy:  Appt start time: 1000 end time:  1100.   Assessment:  Primary concerns today: obesity.   MEDICATIONS: see list.   DIETARY INTAKE: Doesn't fry foods- tries to follow eat healthy diet from Internet.  Drinks fruit drinks.  Used to eat only 1 time a day, but now eats 3 meals/day.  Grills more, eats more whole grains.  C/o GERD and is careful with her foods. Tries not to eat late or eat spicy foods.  No sodas, not much sugar or sugary drinks; uses no-sugary flavoring, drinks herbal teas and tries to limit caffeine  Estimated energy needs: 2400 calories 270 g carbohydrates 180 g protein 67 g fat  Progress Towards Goal(s):  In progress.   Nutritional Diagnosis:  [place]-3.3 Overweight/obesity As related to chronic dieting and related binging, emotional eating, and yo-yo effects of food restriction.  As evidenced by BMI of 37.    Intervention:  Nutrition counseling provided.  [name] is here for guidance on weight loss and eating a heart-healthy diet.  She recently suffered from a heart attack and is very concerned about her health.  She doesn't have high cholesterol, but does have a history of heart disease in her family.  She has smaller arteries than average and that could have contributed to her MI. Her sister recently underwent bariatric surgery and now [name] is considering it.    He father's family are all tall and have large builds.  She has hypothyroidism as well.   [name] used to weigh about 170 pounds and she felt at that time that was a good weight for her.  She started taking a different medication and started a new job that was stressful and she had a lot of personal stress in her life.  She started eating out more and drinking more sodas and subsequently gained about 100 pounds.  Her brother made a comment that she should lose weight so she did.  She gained weight watchers and started exercising and lost some weight, but her health deteriorated and she stopped her  weight-loss efforts.  When she had her daughter a few years back, she lost again down to about 170 because she was making healthier choices for her and her baby and then she breast fed for awhile and lost a lot of weight.  At 170 lb she felt great.  She felt she looked good and was healthy, but she received a lot of negative feedback about her weight so she gained it back.  She is very emotional about her weight.  She currently tries to follow a diet she found on the Internet, but she admits to not being "disciplined" and breaking the diet and binging on comfort foods and emotional eating.   Discussed Intuitive Eating.  Discussed listening to internal hunger/satiety cues, rather than external forces.  Encouraged her to disregard what other people say about her weight and pay attention to how she feels.  Suggested checking in with herself throughout the day: how hungry is she on a scale of 0-5? How does she feel- emotionally? Is she happy, sad, bored, angry?  She has lost or forgotten her ability to sense hunger.  Encouraged her to actively and consciously think about her hunger.  When she's ready to eat, think about what food would taste good, would be satisfying to her and choose that food.  There are no good foods or bad foods.  Food is fuel and encouraged her to trust and enjoy eating again.  Told her we will discuss nutritional values of foods at a subsequent appointment, but not today- today is about learning to eat again without having to diet and binge and feel guilty.  Suggested sitting down to eat meal at the table and to minimize distractions.  Eat as a family with daughter.  Make meal last 20-25 minutes to give time to register satiety.  Chew food slowly and taste it.  Savor it and enjoy it.  Eat when she's hungry, not because it's the right time or she feels like she should.  Check in throughout the meal on how hungry she is and how she feels.  Told her this will be a learning process, this is going to  take some time, but not to get frustrated.  Each new experience is an opportunity for growth.    Monitoring/Evaluation:  Dietary intake, emotional health, and body weight in 1 month(s).

## 2012-03-20 ENCOUNTER — Encounter (HOSPITAL_COMMUNITY): Payer: Medicare PPO

## 2012-03-20 ENCOUNTER — Ambulatory Visit (HOSPITAL_COMMUNITY): Payer: Medicare PPO

## 2012-03-22 ENCOUNTER — Ambulatory Visit (HOSPITAL_COMMUNITY): Payer: Medicare PPO

## 2012-03-22 ENCOUNTER — Encounter (HOSPITAL_COMMUNITY)
Admission: RE | Admit: 2012-03-22 | Discharge: 2012-03-22 | Disposition: A | Discharge status: Home / Self Care | Payer: Medicare PPO | Source: Ambulatory Visit | Attending: Cardiology | Admitting: Cardiology

## 2012-03-22 NOTE — Progress Notes (Signed)
Reviewed home exercise guidelines with patient including endpoints, temperature precautions, target heart rate and rate of perceived exertion. Also discussed prophylactic NTG use as directed by her cardiologist. Pt plans to walk as her mode of home exercise. Pt voices understanding of instructions given.

## 2012-03-25 ENCOUNTER — Encounter (HOSPITAL_COMMUNITY)
Admission: RE | Admit: 2012-03-25 | Discharge: 2012-03-25 | Disposition: A | Discharge status: Home / Self Care | Payer: Medicare PPO | Source: Ambulatory Visit | Attending: Cardiology | Admitting: Cardiology

## 2012-03-25 ENCOUNTER — Ambulatory Visit (HOSPITAL_COMMUNITY): Payer: Medicare PPO

## 2012-03-25 NOTE — Progress Notes (Signed)
Pt c/o mild dizziness while walking the track.  BP-116/70.  Pt reports she develops this sx if she walks quickly and position change, she notices this at home as well.  Pt reports relief of sx with walking slower pace.  Pt encouraged to continue exercise at slower pace.  Pt did so with relief of sx.  Pt also encouraged to increased PO fluid intake during exercise and at home also.  Understanding verbalized

## 2012-03-27 ENCOUNTER — Ambulatory Visit (HOSPITAL_COMMUNITY): Payer: Medicare PPO

## 2012-03-27 ENCOUNTER — Encounter (HOSPITAL_COMMUNITY): Admission: RE | Admit: 2012-03-27 | Payer: Medicare PPO | Source: Ambulatory Visit

## 2012-03-29 ENCOUNTER — Ambulatory Visit (HOSPITAL_COMMUNITY): Payer: Medicare PPO

## 2012-03-29 ENCOUNTER — Encounter (HOSPITAL_COMMUNITY)
Admission: RE | Admit: 2012-03-29 | Discharge: 2012-03-29 | Disposition: A | Discharge status: Home / Self Care | Payer: Medicare PPO | Source: Ambulatory Visit | Attending: Cardiology | Admitting: Cardiology

## 2012-03-29 ENCOUNTER — Telehealth (HOSPITAL_COMMUNITY): Payer: Self-pay | Admitting: Cardiac Rehabilitation

## 2012-04-01 ENCOUNTER — Ambulatory Visit (HOSPITAL_COMMUNITY): Payer: Medicare PPO

## 2012-04-01 ENCOUNTER — Encounter (HOSPITAL_COMMUNITY): Payer: Medicare PPO

## 2012-04-03 ENCOUNTER — Ambulatory Visit (HOSPITAL_COMMUNITY): Payer: Medicare PPO

## 2012-04-03 ENCOUNTER — Encounter (HOSPITAL_COMMUNITY)
Admission: RE | Admit: 2012-04-03 | Discharge: 2012-04-03 | Disposition: A | Discharge status: Home / Self Care | Payer: Medicare PPO | Source: Ambulatory Visit | Attending: Cardiology | Admitting: Cardiology

## 2012-04-03 NOTE — Progress Notes (Signed)
Pt arrived to cardiac rehab with 1kg weight gain in 2 days.  Pt c/o mild dyspnea more than usual associated with finger and lower extremity edema.  Pt reports she is premenstrual and  She feels edema/wt gain could be related to that.  Pt lungs mostly clear with faint crackles in lower lobes, right >left.  O2 sat-98%.  Pt reports she would like MD evaluation for the above sx however prefers to not visit her cardiologist or his associates.  She is currently looking for new cardiologist.  Appt scheduled with pt PCP, Dr. Clydie Braun 04/04/12 @10am .  Pt cautioned to limit sodium intake.  Pt verbalized understanding.

## 2012-04-05 ENCOUNTER — Ambulatory Visit (HOSPITAL_COMMUNITY): Payer: Medicare PPO

## 2012-04-05 ENCOUNTER — Encounter (HOSPITAL_COMMUNITY): Payer: Medicare PPO

## 2012-04-08 ENCOUNTER — Encounter (HOSPITAL_COMMUNITY): Payer: Medicare PPO

## 2012-04-08 ENCOUNTER — Telehealth (HOSPITAL_COMMUNITY): Payer: Self-pay | Admitting: Cardiac Rehabilitation

## 2012-04-08 ENCOUNTER — Ambulatory Visit (HOSPITAL_COMMUNITY): Payer: Medicare PPO

## 2012-04-08 NOTE — Telephone Encounter (Signed)
pc to assess reason for absence from cardiac rehab. Pt states she did not go to Dr. Clydie Braun (PCP) appt last week as scheduled because she "felt too bad to go".  Pt states she was seen today by Dr. Clydie Braun for pedal and finger edema.  Pt states Dr. Clydie Braun made medication adjustments including DC Amlodipine and starting new med with diuretic.  Pt advised to bring new medication to cardiac rehab for med reconcilation.  Pt states she is also suffering from cold sx this week as well as her menstrual period started yesterday which historically precipitates seizure activity.  Pt will remain out of cardiac rehab remainder of this week to recover from all of the above before exercise is resumed.  Pt will plan to return Monday 04/15/12 unless sx unrelieved.  Understanding verbalized

## 2012-04-09 ENCOUNTER — Ambulatory Visit: Payer: Medicare PPO | Admitting: Cardiology

## 2012-04-10 ENCOUNTER — Ambulatory Visit (HOSPITAL_COMMUNITY): Payer: Medicare PPO

## 2012-04-10 ENCOUNTER — Encounter (HOSPITAL_COMMUNITY): Payer: Medicare PPO

## 2012-04-12 ENCOUNTER — Ambulatory Visit (HOSPITAL_COMMUNITY): Payer: Medicare PPO

## 2012-04-12 ENCOUNTER — Encounter (HOSPITAL_COMMUNITY): Payer: Medicare PPO

## 2012-04-15 ENCOUNTER — Ambulatory Visit (HOSPITAL_COMMUNITY): Payer: Medicare PPO

## 2012-04-15 ENCOUNTER — Encounter (HOSPITAL_COMMUNITY): Payer: Medicare PPO

## 2012-04-17 ENCOUNTER — Encounter (HOSPITAL_COMMUNITY)
Admission: RE | Admit: 2012-04-17 | Discharge: 2012-04-17 | Disposition: A | Discharge status: Home / Self Care | Payer: Medicare PPO | Source: Ambulatory Visit | Attending: Cardiology | Admitting: Cardiology

## 2012-04-17 ENCOUNTER — Ambulatory Visit (HOSPITAL_COMMUNITY): Payer: Medicare PPO

## 2012-04-17 DIAGNOSIS — J45909 Unspecified asthma, uncomplicated: Secondary | ICD-10-CM | POA: Insufficient documentation

## 2012-04-17 DIAGNOSIS — I209 Angina pectoris, unspecified: Secondary | ICD-10-CM | POA: Insufficient documentation

## 2012-04-17 DIAGNOSIS — I2510 Atherosclerotic heart disease of native coronary artery without angina pectoris: Secondary | ICD-10-CM | POA: Insufficient documentation

## 2012-04-17 DIAGNOSIS — I10 Essential (primary) hypertension: Secondary | ICD-10-CM | POA: Insufficient documentation

## 2012-04-17 DIAGNOSIS — E039 Hypothyroidism, unspecified: Secondary | ICD-10-CM | POA: Insufficient documentation

## 2012-04-17 DIAGNOSIS — R569 Unspecified convulsions: Secondary | ICD-10-CM | POA: Insufficient documentation

## 2012-04-17 DIAGNOSIS — R0789 Other chest pain: Secondary | ICD-10-CM | POA: Insufficient documentation

## 2012-04-17 DIAGNOSIS — K219 Gastro-esophageal reflux disease without esophagitis: Secondary | ICD-10-CM | POA: Insufficient documentation

## 2012-04-17 DIAGNOSIS — E669 Obesity, unspecified: Secondary | ICD-10-CM | POA: Insufficient documentation

## 2012-04-17 DIAGNOSIS — Z8249 Family history of ischemic heart disease and other diseases of the circulatory system: Secondary | ICD-10-CM | POA: Insufficient documentation

## 2012-04-17 DIAGNOSIS — Z5189 Encounter for other specified aftercare: Secondary | ICD-10-CM | POA: Insufficient documentation

## 2012-04-17 DIAGNOSIS — Z823 Family history of stroke: Secondary | ICD-10-CM | POA: Insufficient documentation

## 2012-04-17 DIAGNOSIS — E785 Hyperlipidemia, unspecified: Secondary | ICD-10-CM | POA: Insufficient documentation

## 2012-04-19 ENCOUNTER — Encounter (HOSPITAL_COMMUNITY)
Admission: RE | Admit: 2012-04-19 | Discharge: 2012-04-19 | Disposition: A | Discharge status: Home / Self Care | Payer: Medicare PPO | Source: Ambulatory Visit | Attending: Cardiology | Admitting: Cardiology

## 2012-04-19 ENCOUNTER — Ambulatory Visit (HOSPITAL_COMMUNITY): Payer: Medicare PPO

## 2012-04-19 ENCOUNTER — Ambulatory Visit: Payer: Medicare PPO | Admitting: *Deleted

## 2012-04-22 ENCOUNTER — Ambulatory Visit (HOSPITAL_COMMUNITY): Payer: Medicare PPO

## 2012-04-22 ENCOUNTER — Telehealth: Payer: Self-pay | Admitting: Cardiology

## 2012-04-22 ENCOUNTER — Encounter (HOSPITAL_COMMUNITY)
Admission: RE | Admit: 2012-04-22 | Discharge: 2012-04-22 | Disposition: A | Discharge status: Home / Self Care | Payer: Medicare PPO | Source: Ambulatory Visit | Attending: Cardiology | Admitting: Cardiology

## 2012-04-22 NOTE — Telephone Encounter (Signed)
New problem:   Discuss upcoming surgery.

## 2012-04-22 NOTE — Telephone Encounter (Signed)
Spoke with pt. Pt scheduled for surgery 05/22/12 at [facility] vagus nerve stimulator battery replacement and possible wires replaced. Pt requesting an appt to see Dr Shirlee Latch prior to surgery.

## 2012-04-22 NOTE — Telephone Encounter (Signed)
Pt given appt with Dr Shirlee Latch 04/29/12.

## 2012-04-24 ENCOUNTER — Encounter (HOSPITAL_COMMUNITY): Payer: Medicare PPO

## 2012-04-24 ENCOUNTER — Ambulatory Visit (HOSPITAL_COMMUNITY): Payer: Medicare PPO

## 2012-04-26 ENCOUNTER — Ambulatory Visit (HOSPITAL_COMMUNITY): Payer: Medicare PPO

## 2012-04-26 ENCOUNTER — Encounter (HOSPITAL_COMMUNITY)
Admission: RE | Admit: 2012-04-26 | Discharge: 2012-04-26 | Disposition: A | Discharge status: Home / Self Care | Payer: Medicare PPO | Source: Ambulatory Visit | Attending: Cardiology | Admitting: Cardiology

## 2012-04-29 ENCOUNTER — Ambulatory Visit (HOSPITAL_COMMUNITY): Payer: Medicare PPO

## 2012-04-29 ENCOUNTER — Encounter (HOSPITAL_COMMUNITY): Payer: Medicare PPO

## 2012-04-29 ENCOUNTER — Ambulatory Visit: Payer: Medicare PPO | Admitting: Cardiology

## 2012-05-01 ENCOUNTER — Encounter (HOSPITAL_COMMUNITY): Payer: Medicare PPO

## 2012-05-01 ENCOUNTER — Telehealth: Payer: Self-pay | Admitting: Cardiology

## 2012-05-01 ENCOUNTER — Ambulatory Visit (HOSPITAL_COMMUNITY): Payer: Medicare PPO

## 2012-05-01 NOTE — Telephone Encounter (Signed)
Pt. states that she was in court the entire day on 11/25 and missed her last scheduled appt. with Dr. Shirlee Latch, she did not call to cancell that appt. Appt. rescheduled for 05/09/12 at 12:10 with Tereso Newcomer, PA-C, Dr. Shirlee Latch will be in office on that day. Pt. repeated appt. date and time back to me and verbalized understanding.

## 2012-05-01 NOTE — Telephone Encounter (Signed)
Pt had surgical clearance appt on 11-25 and no showed the appt, calling back today and told me to get her in before 12-13 because that's her surgery date, dr Shirlee Latch is booked until past then, I offered the PA, pt declined, so I told her her could rs surgery then see [facility]clean , she said no,  she said just to have the nurse call her, that she will work her in

## 2012-05-03 ENCOUNTER — Ambulatory Visit (HOSPITAL_COMMUNITY): Payer: Medicare PPO

## 2012-05-06 ENCOUNTER — Ambulatory Visit (HOSPITAL_COMMUNITY): Payer: Medicare PPO

## 2012-05-06 ENCOUNTER — Encounter (HOSPITAL_COMMUNITY): Payer: Medicare Other

## 2012-05-06 ENCOUNTER — Encounter (HOSPITAL_COMMUNITY)
Admission: RE | Admit: 2012-05-06 | Discharge: 2012-05-06 | Disposition: A | Discharge status: Home / Self Care | Payer: Medicare Other | Source: Ambulatory Visit | Attending: Cardiology | Admitting: Cardiology

## 2012-05-06 DIAGNOSIS — R0789 Other chest pain: Secondary | ICD-10-CM | POA: Insufficient documentation

## 2012-05-06 DIAGNOSIS — E785 Hyperlipidemia, unspecified: Secondary | ICD-10-CM | POA: Insufficient documentation

## 2012-05-06 DIAGNOSIS — E669 Obesity, unspecified: Secondary | ICD-10-CM | POA: Insufficient documentation

## 2012-05-06 DIAGNOSIS — Z5189 Encounter for other specified aftercare: Secondary | ICD-10-CM | POA: Insufficient documentation

## 2012-05-06 DIAGNOSIS — I209 Angina pectoris, unspecified: Secondary | ICD-10-CM | POA: Insufficient documentation

## 2012-05-06 DIAGNOSIS — Z823 Family history of stroke: Secondary | ICD-10-CM | POA: Insufficient documentation

## 2012-05-06 DIAGNOSIS — K219 Gastro-esophageal reflux disease without esophagitis: Secondary | ICD-10-CM | POA: Insufficient documentation

## 2012-05-06 DIAGNOSIS — Z8249 Family history of ischemic heart disease and other diseases of the circulatory system: Secondary | ICD-10-CM | POA: Insufficient documentation

## 2012-05-06 DIAGNOSIS — E039 Hypothyroidism, unspecified: Secondary | ICD-10-CM | POA: Insufficient documentation

## 2012-05-06 DIAGNOSIS — I10 Essential (primary) hypertension: Secondary | ICD-10-CM | POA: Insufficient documentation

## 2012-05-06 DIAGNOSIS — I2510 Atherosclerotic heart disease of native coronary artery without angina pectoris: Secondary | ICD-10-CM | POA: Insufficient documentation

## 2012-05-06 DIAGNOSIS — J45909 Unspecified asthma, uncomplicated: Secondary | ICD-10-CM | POA: Insufficient documentation

## 2012-05-06 DIAGNOSIS — R569 Unspecified convulsions: Secondary | ICD-10-CM | POA: Insufficient documentation

## 2012-05-07 ENCOUNTER — Ambulatory Visit: Payer: Medicare PPO | Admitting: *Deleted

## 2012-05-08 ENCOUNTER — Encounter (HOSPITAL_COMMUNITY)
Admission: RE | Admit: 2012-05-08 | Discharge: 2012-05-08 | Disposition: A | Discharge status: Home / Self Care | Payer: Medicare Other | Source: Ambulatory Visit | Attending: Cardiology | Admitting: Cardiology

## 2012-05-08 ENCOUNTER — Ambulatory Visit (HOSPITAL_COMMUNITY): Payer: Medicare PPO

## 2012-05-09 ENCOUNTER — Telehealth: Payer: Self-pay | Admitting: *Deleted

## 2012-05-09 ENCOUNTER — Encounter: Payer: Self-pay | Admitting: Physician Assistant

## 2012-05-09 ENCOUNTER — Ambulatory Visit (INDEPENDENT_AMBULATORY_CARE_PROVIDER_SITE_OTHER): Payer: Medicare PPO | Admitting: Physician Assistant

## 2012-05-09 VITALS — BP 135/84 | HR 92 | Ht 65.0 in | Wt 235.0 lb

## 2012-05-09 DIAGNOSIS — I10 Essential (primary) hypertension: Secondary | ICD-10-CM

## 2012-05-09 DIAGNOSIS — Z01810 Encounter for preprocedural cardiovascular examination: Secondary | ICD-10-CM

## 2012-05-09 DIAGNOSIS — R079 Chest pain, unspecified: Secondary | ICD-10-CM

## 2012-05-09 DIAGNOSIS — R609 Edema, unspecified: Secondary | ICD-10-CM

## 2012-05-09 DIAGNOSIS — R0602 Shortness of breath: Secondary | ICD-10-CM

## 2012-05-09 DIAGNOSIS — G40909 Epilepsy, unspecified, not intractable, without status epilepticus: Secondary | ICD-10-CM

## 2012-05-09 DIAGNOSIS — E785 Hyperlipidemia, unspecified: Secondary | ICD-10-CM

## 2012-05-09 LAB — HEPATIC FUNCTION PANEL
ALT: 24 U/L (ref 0–35)
AST: 15 U/L (ref 0–37)
Albumin: 3.6 g/dL (ref 3.5–5.2)
Alkaline Phosphatase: 53 U/L (ref 39–117)
Bilirubin, Direct: 0 mg/dL (ref 0.0–0.3)
Total Bilirubin: 0.3 mg/dL (ref 0.3–1.2)
Total Protein: 7.2 g/dL (ref 6.0–8.3)

## 2012-05-09 LAB — BASIC METABOLIC PANEL
BUN: 14 mg/dL (ref 6–23)
CO2: 24 mEq/L (ref 19–32)
Calcium: 8.7 mg/dL (ref 8.4–10.5)
Chloride: 112 mEq/L (ref 96–112)
Creatinine, Ser: 0.9 mg/dL (ref 0.4–1.2)
GFR: 81.66 mL/min (ref >60.00)
Glucose, Bld: 88 mg/dL (ref 70–99)
Potassium: 3.8 mEq/L (ref 3.5–5.1)
Sodium: 141 mEq/L (ref 135–145)

## 2012-05-09 LAB — BRAIN NATRIURETIC PEPTIDE: Pro B Natriuretic peptide (BNP): 10 pg/mL (ref 0.0–100.0)

## 2012-05-09 LAB — TSH: TSH: 0.76 u[IU]/mL (ref 0.35–5.50)

## 2012-05-09 MED ORDER — FUROSEMIDE 20 MG PO TABS: ORAL_TABLET | ORAL | Status: DC

## 2012-05-09 MED ORDER — POTASSIUM CHLORIDE ER 10 MEQ PO TBCR: EXTENDED_RELEASE_TABLET | ORAL | Status: DC

## 2012-05-09 MED ORDER — POTASSIUM CHLORIDE ER 10 MEQ PO TBCR: 10.0000 meq | EXTENDED_RELEASE_TABLET | Freq: Every day | ORAL | Status: DC

## 2012-05-09 MED ORDER — OLMESARTAN MEDOXOMIL 20 MG PO TABS: 20.0000 mg | ORAL_TABLET | Freq: Every day | ORAL | Status: DC

## 2012-05-09 MED ORDER — FUROSEMIDE 20 MG PO TABS: 20.0000 mg | ORAL_TABLET | Freq: Every day | ORAL | Status: DC

## 2012-05-09 NOTE — Telephone Encounter (Signed)
pt notified about lab results and lasix/K+ dose changes w/verbal understanding, bmet on 12/9 when sees Bing Neighbors. PA

## 2012-05-09 NOTE — Progress Notes (Signed)
[address] [place], [place]  [address] Phone: [phone], Fax:  [phone]  Date:  05/09/2012   Name:  [name]   DOB:  03-30-1966   MRN:  [id number]  PCP:  Genia Hotter, MD  Primary Cardiologist:  Dr. Marca Ancona  Primary Electrophysiologist:  None    History of Present Illness: [name] is a 46 y.o. female who returns for evaluation of edema and surgical clearance.  She has a hx of chest pain and prior negative caths as well as migraines and HTN. She has had chest pain chronically over a number of years. She had an abnormal stress test in [place] in 2008. She was then sent for a cardiac cath, which she says was normal.  ETT-myoview in this office in 11/12 that showed ischemic-type ST depression on exercise ECG, but there was no evidence for ischemia on myoview images. EF was 66%. Given ongoing chest pain and ischemic ECG changes with normal perfusion images (equivocal study), Coronary CT angiogram was done. This was a difficult study because we were not able to get her HR as low as would be ideal. However, she had no coronary calcium and no definite stenosis was seen (though mid-RCA was not fully evaluated due to artifact). Dr. Shirlee Latch felt that this was a negative study.  In 5/13, she went to the ER in [place] with particularly bad exertional chest pain that lasted a prolonged time. Troponin was mildly elevated with normal CKMB. She had a left heart cath in the hospital and [place] which showed no angiographic CAD. She was sent home on Imdur and amlodipine. Echo showed normal EF.  Dr. 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 She seems to have improved somewhat but still has chest pain with heavy exertion and still occasionally uses NTG.   Patient has a vagal nerve stimulator for control of her seizures. This needs to be replaced and will be done at [facility] 05/22/12.  Patient was recently noted to have significant dyspnea and edema at cardiac rehabilitation. She saw her primary care doctor and her amlodipine was discontinued. She also had her simvastatin changed to pravastatin due to myalgias. Patient tells me that she apparently had a very abnormal lung exam. She has also noted worsening dyspnea and actually describes class III symptoms. She has been sleeping on 2 pillows and describes PND. Her exertional angina is stable. There has been no significant change. Of note, she denies any worsening symptoms of stopping amlodipine. She denies syncope.  Labs (9/12): creatinine 0.9  Labs (11/12): LDL 130, HDL 49, TSH normal, K 3.6, creatinine 0.8, Lp(a) 31  Labs (5/13): K 4, creatinine 0.7, LDL 91, TnI 0.86 => 0.98 with normal CKMB  Labs (7/13): negative troponin  ECG: NSR, normal (QTc normal).    Wt Readings from Last 3 Encounters:  05/09/12 235 lb (106.595 kg)  03/19/12 231 lb 3.2 oz (104.872 kg)  03/07/12 228 lb 9.9 oz (103.7 kg)    Past Medical History:   1. Chest pain: Multiple episodes over the years. She had an abnormal stress test in 2008 in [place] and it sounds like she went to [facility] for a cardiac catheterization which per her report was normal. Stress echo at [facility] in 2010 was submaximal but showed no evidence for ischemia. ETT-myoview (11/12): 7'15", EF 66%, significant ST depression on exercise ECG, apical thinning on myoview images but no evidence  for ischemia. Coronary CT angiogram (11/12): calcium score 0, difficult study with gating artifact, mid RCA nonevaluatable but no stenosis elsewhere in the coronaries. Echo (11/12): EF 55-60%, mild LV hypertrophy, mild MR. NSTEMI in [place] in 5/13 with elevated troponin but LHC showed normal coronaries. Echo (5/13) with EF 60-65%, normal valve, no LVH. 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  2. Migraines: Uses triptan medications.  3. HTN  4. Seizure disorder: Has vagus nerve  stimulator.  5. Hypothyroidism  6. Asthma since childhood   Current Outpatient Prescriptions  Medication Sig Dispense Refill  . budesonide-formoterol (SYMBICORT) 160-4.5 MCG/ACT inhaler Inhale 2 puffs into the lungs 2 (two) times daily.      . Coenzyme Q10 200 MG TABS Take by mouth.    0  . Fluticasone-Salmeterol (ADVAIR DISKUS) 250-50 MCG/DOSE AEPB Inhale one puff into lungs twice a day  60 each  6  . isosorbide mononitrate (IMDUR) 60 MG 24 hr tablet TAKE 1 TABLET (60 MG TOTAL) BY MOUTH 2 (TWO) TIMES DAILY.  60 tablet  6  . levETIRAcetam (KEPPRA) 500 MG tablet Take 500 mg by mouth 2 (two) times daily.      Marland Kitchen levothyroxine (SYNTHROID, LEVOTHROID) 50 MCG tablet Take 50 mcg by mouth daily.        . metoprolol tartrate (LOPRESSOR) 25 MG tablet Take 12.5 mg by mouth 2 (two) times daily.       . nitroGLYCERIN (NITROSTAT) 0.4 MG SL tablet Place 1 tablet (0.4 mg total) under the tongue every 5 (five) minutes as needed.  100 tablet  3  . olmesartan-hydrochlorothiazide (BENICAR HCT) 20-12.5 MG per tablet Take 1 tablet by mouth daily.      Marland Kitchen omeprazole (PRILOSEC) 20 MG capsule Take 40 mg by mouth daily.      . pantoprazole (PROTONIX) 40 MG tablet Take 40 mg by mouth daily.      . pravastatin (PRAVACHOL) 40 MG tablet Take 40 mg by mouth daily.      . ranolazine (RANEXA) 500 MG 12 hr tablet Take 1 tablet (500 mg total) by mouth 2 (two) times daily.  60 tablet  6  . SUMAtriptan (IMITREX) 100 MG tablet Take 100 mg by mouth every 2 (two) hours as needed. For migraines      . topiramate (TOPAMAX) 50 MG tablet Take 100 mg by mouth 2 (two) times daily.         Allergies: Allergies  Allergen Reactions  . Amitriptyline Swelling  . Latex Itching and Swelling    Whelps, burning    Social History:  The patient  reports that she has never smoked. She does not have any smokeless tobacco history on file.   ROS:  Please see the history of present illness.   She has a cough with clear sputum production. Denies  fevers or chills. Denies melena, hematochezia, hematuria, vomiting, diarrhea.   All other systems reviewed and negative.   PHYSICAL EXAM: VS:  BP 135/84  Pulse 92  Ht 5\' 5"  (1.651 m)  Wt 235 lb (106.595 kg)  BMI 39.11 kg/m2 Well nourished, well developed, in no acute distress HEENT: normal Neck: Minimally elevated JVD Cardiac:  normal S1, S2; RRR; no murmur; no S3 Lungs:  clear to auscultation bilaterally, no wheezing, rhonchi or rales Abd: soft, nontender, no hepatomegaly Ext: Trace bilateral LE edema Skin: warm and dry Neuro:  CNs 2-12 intact, no focal abnormalities noted  EKG:  NSR, HR 92, normal axis, nonspecific ST-T wave changes, no change from prior  tracings      ASSESSMENT AND PLAN:  1. Edema:   Patient has noted worsening edema since starting on amlodipine. However, she notes no improvement since stopping amlodipine. She's on mild diuretic with her Benicar HCT. On exam, she has very mild edema and just minimally elevated JVD. She has had an echocardiogram in the past that demonstrated mild LVH. I suspect that she may have a component of diastolic dysfunction. Her lung exam is clear today. Overall, she did not appear to be significantly volume overloaded. I had a long discussion regarding this with her today. She is due to have surgery 12/18.     -  I will change her Benicar HCT to Benicar 20 mg daily    -  Start Lasix 40 mg. She will take this daily for 2 days. Then reduce to 20 mg daily.    -  Add potassium 20 mEq daily for 2 days, then 10 mEq daily    -  Check a basic metabolic panel, BNP, LFTs, TSH.    -  Repeat basic metabolic panel in one week.  2. Dyspnea:   Obtain a BNP today. Also obtain a chest x-ray. Initiate Lasix as noted above.  3. Chronic Angina:   This is overall stable. There was no worsening with the discontinuation of amlodipine.  4. Hyperlipidemia:   She is now on pravastatin.  5. Seizure Disorder:   Change out for her vagal nerve stimulator pending  05/22/12.   6. Disposition:    I will have her followup in one week with either Dr. Shirlee Latch or me.  Luna Glasgow, PA-C  12:49 PM 05/09/2012

## 2012-05-09 NOTE — Patient Instructions (Addendum)
Your physician recommends that you schedule a follow-up appointment in:  1 week with Dr.McLean or with Lilian Coma on day Dr. Shirlee Latch is in office.  Your physician recommends that you return for lab work in:  1 week on day of appt.--BMP   Have chest X-ray done at [facility].  Your physician has recommended you make the following change in your medication:  \                Stop Benicar HCT.  Start Benicar 20 mg by mouth daily.                Take furosemide 40 by mouth daily for 2 days.  Take KDur 20 meq by mouth daily for 2 days.                   After two days change to furosemide 20 mg by mouth daily and KDur 10 meq by mouth daily.

## 2012-05-09 NOTE — Telephone Encounter (Signed)
Message copied by Tarri Fuller on Thu May 09, 2012  5:27 PM ------      Message from: [place], [place] T      Created: Thu May 09, 2012  5:14 PM       BNP is normal.      Other labs are ok too.      In light of the fact BNP is normal and no sign of CHF, I want her to take the following:      Lasix 40 mg QD and K+ 20 mEq QD x 3 days, then stop.      She can take Lasix 20 mg QD and K+ 10 mEq QD  As needed  For edema in the future.      I would rather she not take regular lasix as it may dehydrate her.      Check BMET in one week as planned.      Tereso Newcomer, PA-C  5:14 PM 05/09/2012

## 2012-05-10 ENCOUNTER — Encounter (HOSPITAL_COMMUNITY)
Admission: RE | Admit: 2012-05-10 | Discharge: 2012-05-10 | Disposition: A | Discharge status: Home / Self Care | Payer: Medicare Other | Source: Ambulatory Visit | Attending: Cardiology | Admitting: Cardiology

## 2012-05-10 ENCOUNTER — Ambulatory Visit (HOSPITAL_COMMUNITY)
Admission: RE | Admit: 2012-05-10 | Discharge: 2012-05-10 | Disposition: A | Discharge status: Home / Self Care | Payer: Medicare Other | Source: Ambulatory Visit | Attending: Physician Assistant | Admitting: Physician Assistant

## 2012-05-10 ENCOUNTER — Ambulatory Visit (HOSPITAL_COMMUNITY): Payer: Medicare PPO

## 2012-05-10 DIAGNOSIS — J45909 Unspecified asthma, uncomplicated: Secondary | ICD-10-CM | POA: Insufficient documentation

## 2012-05-10 DIAGNOSIS — R0602 Shortness of breath: Secondary | ICD-10-CM

## 2012-05-10 DIAGNOSIS — R079 Chest pain, unspecified: Secondary | ICD-10-CM

## 2012-05-13 ENCOUNTER — Ambulatory Visit (HOSPITAL_COMMUNITY): Payer: Medicare PPO

## 2012-05-13 ENCOUNTER — Encounter: Payer: Self-pay | Admitting: Physician Assistant

## 2012-05-13 ENCOUNTER — Encounter (HOSPITAL_COMMUNITY): Payer: Medicare Other

## 2012-05-13 ENCOUNTER — Ambulatory Visit: Payer: Medicare PPO | Admitting: *Deleted

## 2012-05-13 ENCOUNTER — Ambulatory Visit (INDEPENDENT_AMBULATORY_CARE_PROVIDER_SITE_OTHER): Payer: Medicare Other | Admitting: Physician Assistant

## 2012-05-13 ENCOUNTER — Other Ambulatory Visit (INDEPENDENT_AMBULATORY_CARE_PROVIDER_SITE_OTHER): Payer: Medicare Other

## 2012-05-13 VITALS — BP 121/89 | HR 85 | Ht 65.5 in | Wt 230.8 lb

## 2012-05-13 DIAGNOSIS — Z01810 Encounter for preprocedural cardiovascular examination: Secondary | ICD-10-CM

## 2012-05-13 DIAGNOSIS — R0989 Other specified symptoms and signs involving the circulatory and respiratory systems: Secondary | ICD-10-CM

## 2012-05-13 DIAGNOSIS — R079 Chest pain, unspecified: Secondary | ICD-10-CM

## 2012-05-13 DIAGNOSIS — R609 Edema, unspecified: Secondary | ICD-10-CM

## 2012-05-13 DIAGNOSIS — IMO0001 Reserved for inherently not codable concepts without codable children: Secondary | ICD-10-CM

## 2012-05-13 DIAGNOSIS — M791 Myalgia, unspecified site: Secondary | ICD-10-CM

## 2012-05-13 DIAGNOSIS — E785 Hyperlipidemia, unspecified: Secondary | ICD-10-CM

## 2012-05-13 NOTE — Progress Notes (Signed)
[address] [place], [place]  [address] Phone: [phone], Fax:  [phone]  Date:  05/13/2012   Name:  [name]   DOB:  [birth year]   MRN:  [id number]  PCP:  Genia Hotter, MD  Primary Cardiologist:  Dr. Marca Ancona  Primary Electrophysiologist:  None    History of Present Illness: [name] is a 46 y.o. female who returns for follow up on edema and surgical clearance.  She has a hx of chest pain and prior negative caths as well as migraines and HTN. She has had chest pain chronically over a number of years. She had an abnormal stress test in [place] in 2008. She was then sent for a cardiac cath, which she says was normal.  ETT-myoview in this office in 11/12 that showed ischemic-type ST depression on exercise ECG, but there was no evidence for ischemia on myoview images. EF was 66%. Given ongoing chest pain and ischemic ECG changes with normal perfusion images (equivocal study), Coronary CT angiogram was done. This was a difficult study because we were not able to get her HR as low as would be ideal. However, she had no coronary calcium and no definite stenosis was seen (though mid-RCA was not fully evaluated due to artifact). Dr. Shirlee Latch felt that this was a negative study.  In 5/13, she went to the ER in [place] with particularly bad exertional chest pain that lasted a prolonged time. Troponin was mildly elevated with normal CKMB. She had a left heart cath in the hospital and [place] which showed no angiographic CAD. She was sent home on Imdur and amlodipine. Echo showed normal EF.  Dr. 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 She seems to have improved somewhat but still has chest pain with heavy exertion and still occasionally uses NTG.   Patient has a vagal nerve stimulator for control of her seizures. This needs to be replaced and will be done at [facility] 05/22/12.    I saw her last week with worsening edema. This was attributed to amlodipine and her PCP changed her to Benicar HCT. She appeared to have some extra volume on board and I changed her Benicar HCT to Benicar and started her on Lasix. BNP returned to normal.  We asked her to take the lasix for just 3 days and stop.  CXR returned normal.  She is feeling better.  Still has some dyspnea.  Chest pain unchanged.  Weight is down 5 lbs.  In retrospect, she seems to be describing more myalgias from statins than anything.  She is feeling better since stopping her statin.  Labs (9/12):    creatinine 0.9  Labs (11/12):  LDL 130, HDL 49, TSH normal, K 3.6, creatinine 0.8, Lp(a) 31  Labs (5/13):    K 4, creatinine 0.7, LDL 91, TnI 0.86 => 0.98 with normal CKMB  Labs (7/13):    negative troponin  Labs (12/13):  TSH 0.76, proBNP 10, Alb 3.6, K 3.8, creatinine 0.9  Wt Readings from Last 3 Encounters:  05/13/12 230 lb 12.8 oz (104.69 kg)  05/09/12 235 lb (106.595 kg)  03/19/12 231 lb 3.2 oz (104.872 kg)    Past Medical History:   1. Chest pain: Multiple episodes over the years. She had an abnormal stress test in 2008 in [place] and it sounds like she went to [facility] for a cardiac catheterization which per her report was normal.  Stress echo at [facility] in 2010 was submaximal but showed no evidence for ischemia. ETT-myoview (11/12): 7'15", EF 66%, significant ST depression on exercise ECG, apical thinning on myoview images but no evidence for ischemia. Coronary CT angiogram (11/12): calcium score 0, difficult study with gating artifact, mid RCA nonevaluatable but no stenosis elsewhere in the coronaries. Echo (11/12): EF 55-60%, mild LV hypertrophy, mild MR. NSTEMI in [place] in 5/13 with elevated troponin but LHC showed normal coronaries. Echo (5/13) with EF 60-65%, normal valve, no LVH. 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  2. Migraines: Uses triptan medications.  3. HTN  4. Seizure disorder:  Has vagus nerve stimulator.  5. Hypothyroidism  6. Asthma since childhood   Current Outpatient Prescriptions  Medication Sig Dispense Refill  . budesonide-formoterol (SYMBICORT) 160-4.5 MCG/ACT inhaler Inhale 2 puffs into the lungs 2 (two) times daily.      . Coenzyme Q10 200 MG TABS Take by mouth.    0  . furosemide (LASIX) 20 MG tablet TAKE 40 MG X 3 DAYS THEN STOP; MAY TAKE 20 MG PRN FOR EDEMA      . isosorbide mononitrate (IMDUR) 60 MG 24 hr tablet TAKE 1 TABLET (60 MG TOTAL) BY MOUTH 2 (TWO) TIMES DAILY.  60 tablet  6  . levETIRAcetam (KEPPRA) 500 MG tablet Take 500 mg by mouth 2 (two) times daily.      Marland Kitchen levothyroxine (SYNTHROID, LEVOTHROID) 50 MCG tablet Take 50 mcg by mouth daily.        . metoprolol tartrate (LOPRESSOR) 25 MG tablet Take 12.5 mg by mouth 2 (two) times daily.       . nitroGLYCERIN (NITROSTAT) 0.4 MG SL tablet Place 1 tablet (0.4 mg total) under the tongue every 5 (five) minutes as needed.  100 tablet  3  . olmesartan (BENICAR) 20 MG tablet Take 1 tablet (20 mg total) by mouth daily.  30 tablet  6  . omeprazole (PRILOSEC) 20 MG capsule Take 40 mg by mouth daily.      . pantoprazole (PROTONIX) 40 MG tablet Take 40 mg by mouth daily.      . potassium chloride (K-DUR) 10 MEQ tablet TAKE 20 MEQ x 3 DAYS THEN STOP; MAY TAKE 10 MEQ PRN WITH LASIX      . pravastatin (PRAVACHOL) 40 MG tablet Take 40 mg by mouth daily.      . ranolazine (RANEXA) 500 MG 12 hr tablet Take 1 tablet (500 mg total) by mouth 2 (two) times daily.  60 tablet  6  . topiramate (TOPAMAX) 50 MG tablet Take 100 mg by mouth 2 (two) times daily.       . SUMAtriptan (IMITREX) 100 MG tablet Take 100 mg by mouth every 2 (two) hours as needed. For migraines        Allergies: Allergies  Allergen Reactions  . Amitriptyline Swelling  . Amlodipine Swelling  . Latex Itching and Swelling    Whelps, burning    Social History:  The patient  reports that she has never smoked. She does not have any smokeless  tobacco history on file.    PHYSICAL EXAM: VS:  BP 121/89  Pulse 85  Ht 5' 5.5" (1.664 m)  Wt 230 lb 12.8 oz (104.69 kg)  BMI 37.82 kg/m2  LMP 05/10/2012 Well nourished, well developed, in no acute distress HEENT: normal Neck: Minimally elevated JVD Cardiac:  normal S1, S2; RRR; no murmur; no S3 Lungs:  clear to auscultation bilaterally, no wheezing, rhonchi or rales  Abd: soft, nontender, no hepatomegaly Ext: Trace bilateral LE edema Skin: warm and dry Neuro:  CNs 2-12 intact, no focal abnormalities noted  EKG:  NSR, HR 85, normal axis, nonspecific ST-T wave changes, no change from prior tracings      ASSESSMENT AND PLAN:  1. Edema:   Improved. Chest x-ray and BNP were normal. I suspect her edema was all related to side effect from amlodipine. No further workup indicated. She may continue to use Lasix on an as-needed basis only. Check a basic metabolic panel today.  2. Dyspnea:   As noted, she notes more problems with myalgias than anything else. I suspect her dyspnea is perceived from this. No further workup.  3. Chronic Angina:   This is overall stable. There was no worsening with the discontinuation of amlodipine.  4. Hyperlipidemia:   When I saw her last time, she noted problems with myalgias and arthralgias. I recommended holding her pravastatin. She seems somewhat improved since stopping this. I will check total CK today.  5. Seizure Disorder:   She may proceed with change out of her vagal nerve stimulator at acceptable cardiovascular risk. No further workup.  6. Disposition:    Followup with Dr. Shirlee Latch as scheduled.  SignedTereso Newcomer, PA-C  11:59 AM 05/13/2012

## 2012-05-13 NOTE — Patient Instructions (Signed)
Your physician wants you to follow-up in: Jan 2014 wit Dr Earlean Shawl will receive a reminder letter in the mail two months in advance. If you don't receive a letter, please call our office to schedule the follow-up appointment.  Your physician recommends that you return for lab work today

## 2012-05-14 ENCOUNTER — Ambulatory Visit: Payer: Medicare PPO | Admitting: *Deleted

## 2012-05-14 ENCOUNTER — Telehealth: Payer: Self-pay | Admitting: *Deleted

## 2012-05-14 LAB — CK: Total CK: 334 U/L — ABNORMAL HIGH (ref 7–177)

## 2012-05-14 NOTE — Telephone Encounter (Signed)
Message copied by Tarri Fuller on Tue May 14, 2012  4:55 PM ------      Message from: [place], [place] T      Created: Tue May 14, 2012  4:31 PM       CK mildly elevated.      Remain off of statin.      Repeat Total CK in 1 week.  Recommend this be drawn before surgery (surgery will increase this some if muscles are cut).      Tereso Newcomer, PA-C  4:30 PM 05/14/2012

## 2012-05-14 NOTE — Telephone Encounter (Signed)
lmptcb to go over lab results 

## 2012-05-15 ENCOUNTER — Ambulatory Visit (HOSPITAL_COMMUNITY): Payer: Medicare PPO

## 2012-05-15 ENCOUNTER — Telehealth: Payer: Self-pay | Admitting: *Deleted

## 2012-05-15 ENCOUNTER — Encounter (HOSPITAL_COMMUNITY): Payer: Medicare Other

## 2012-05-15 DIAGNOSIS — M791 Myalgia, unspecified site: Secondary | ICD-10-CM

## 2012-05-15 NOTE — Telephone Encounter (Signed)
lmptcb to go over lab results and recommendations 

## 2012-05-15 NOTE — Telephone Encounter (Signed)
Message copied by Tarri Fuller on Wed May 15, 2012 12:10 PM ------      Message from: [place], [place] T      Created: Tue May 14, 2012  4:31 PM       CK mildly elevated.      Remain off of statin.      Repeat Total CK in 1 week.  Recommend this be drawn before surgery (surgery will increase this some if muscles are cut).      Tereso Newcomer, PA-C  4:30 PM 05/14/2012

## 2012-05-15 NOTE — Telephone Encounter (Signed)
LAB ORDER PLACED

## 2012-05-15 NOTE — Telephone Encounter (Signed)
pt notified about lab results and to stop statin due to mildly elevated CK. Repeat CK on 12/16 since pt' s surgery has been moved to 12/18 @ [facility] fax # [phone], ph# [phone] Dr. Samson Frederic, will fax lab results to PCP and Surgeon pt req.

## 2012-05-15 NOTE — Telephone Encounter (Signed)
Message copied by Tarri Fuller on Wed May 15, 2012  3:58 PM ------      Message from: [place], [place] T      Created: Tue May 14, 2012  4:31 PM       CK mildly elevated.      Remain off of statin.      Repeat Total CK in 1 week.  Recommend this be drawn before surgery (surgery will increase this some if muscles are cut).      Tereso Newcomer, PA-C  4:30 PM 05/14/2012

## 2012-05-16 ENCOUNTER — Telehealth: Payer: Self-pay | Admitting: *Deleted

## 2012-05-16 ENCOUNTER — Encounter: Payer: Self-pay | Admitting: *Deleted

## 2012-05-16 NOTE — Telephone Encounter (Signed)
lmom to verify PCP # to fax results and ov note per pt req.

## 2012-05-17 ENCOUNTER — Encounter (HOSPITAL_COMMUNITY): Payer: Medicare Other

## 2012-05-17 ENCOUNTER — Telehealth: Payer: Self-pay | Admitting: Cardiology

## 2012-05-17 ENCOUNTER — Ambulatory Visit (HOSPITAL_COMMUNITY): Payer: Medicare PPO

## 2012-05-17 NOTE — Telephone Encounter (Signed)
  She was returning Okey Regal Fiato's call from 05/16/12 Pt's primary care doctor is Dr. Clydie Braun in [place] [facility])  Mylo Red RN

## 2012-05-17 NOTE — Telephone Encounter (Signed)
Pt returning nurse call, she can be reached at [phone]

## 2012-05-20 ENCOUNTER — Other Ambulatory Visit (INDEPENDENT_AMBULATORY_CARE_PROVIDER_SITE_OTHER): Payer: Medicare Other

## 2012-05-20 ENCOUNTER — Encounter (HOSPITAL_COMMUNITY): Payer: Medicare Other

## 2012-05-20 ENCOUNTER — Ambulatory Visit (HOSPITAL_COMMUNITY): Payer: Medicare PPO

## 2012-05-20 DIAGNOSIS — R079 Chest pain, unspecified: Secondary | ICD-10-CM

## 2012-05-20 DIAGNOSIS — Z531 Procedure and treatment not carried out because of patient's decision for reasons of belief and group pressure: Secondary | ICD-10-CM

## 2012-05-20 DIAGNOSIS — IMO0001 Reserved for inherently not codable concepts without codable children: Secondary | ICD-10-CM

## 2012-05-20 DIAGNOSIS — R0602 Shortness of breath: Secondary | ICD-10-CM

## 2012-05-20 DIAGNOSIS — M791 Myalgia, unspecified site: Secondary | ICD-10-CM

## 2012-05-20 HISTORY — DX: Reserved for inherently not codable concepts without codable children: IMO0001

## 2012-05-20 HISTORY — DX: Procedure and treatment not carried out because of patient's decision for reasons of belief and group pressure: Z53.1

## 2012-05-20 LAB — BASIC METABOLIC PANEL WITH GFR
BUN: 10 mg/dL (ref 6–23)
CO2: 26 meq/L (ref 19–32)
Calcium: 8.7 mg/dL (ref 8.4–10.5)
Chloride: 107 meq/L (ref 96–112)
Creatinine, Ser: 0.8 mg/dL (ref 0.4–1.2)
GFR: 98.35 mL/min
Glucose, Bld: 95 mg/dL (ref 70–99)
Potassium: 3.6 meq/L (ref 3.5–5.1)
Sodium: 140 meq/L (ref 135–145)

## 2012-05-20 LAB — CK: Total CK: 353 U/L — ABNORMAL HIGH (ref 7–177)

## 2012-05-21 ENCOUNTER — Telehealth: Payer: Self-pay | Admitting: *Deleted

## 2012-05-21 NOTE — Telephone Encounter (Signed)
pt notified about lab results w/verbal understanding and to f/u w/PCP about CK labs

## 2012-05-21 NOTE — Telephone Encounter (Signed)
Message copied by Tarri Fuller on Tue May 21, 2012 11:18 AM ------      Message from: [place], [place] T      Created: Tue May 21, 2012  8:11 AM       Potassium and kidney function look good.      Continue with current treatment plan.      Total CK remains slightly elevated.  Likely not related to statin since she has been off of this.  Have her follow up with her PCP to further evaluate and decide on further management.      Fax copy of labs to PCP.      Tereso Newcomer, PA-C  4:49 PM 04/18/2012

## 2012-05-22 ENCOUNTER — Encounter (HOSPITAL_COMMUNITY): Payer: Medicare Other

## 2012-05-22 ENCOUNTER — Telehealth (HOSPITAL_COMMUNITY): Payer: Self-pay | Admitting: Cardiac Rehabilitation

## 2012-05-22 ENCOUNTER — Ambulatory Visit (HOSPITAL_COMMUNITY): Payer: Medicare PPO

## 2012-05-22 NOTE — Telephone Encounter (Signed)
pc to pt to check on her post nerve stimulator change out.  Lm with family member

## 2012-05-24 ENCOUNTER — Ambulatory Visit (HOSPITAL_COMMUNITY): Payer: Medicare PPO

## 2012-05-24 ENCOUNTER — Encounter (HOSPITAL_COMMUNITY): Payer: Medicare Other

## 2012-05-27 ENCOUNTER — Encounter (HOSPITAL_COMMUNITY): Payer: Medicare Other

## 2012-05-31 ENCOUNTER — Encounter (HOSPITAL_COMMUNITY): Payer: Medicare Other

## 2012-06-03 ENCOUNTER — Encounter (HOSPITAL_COMMUNITY): Payer: Medicare Other

## 2012-06-06 ENCOUNTER — Ambulatory Visit: Payer: Medicare PPO | Admitting: Cardiology

## 2012-06-07 ENCOUNTER — Encounter (HOSPITAL_COMMUNITY): Payer: Medicare Other

## 2012-06-10 ENCOUNTER — Encounter (HOSPITAL_COMMUNITY): Payer: Medicare Other

## 2012-06-10 DIAGNOSIS — Z9689 Presence of other specified functional implants: Secondary | ICD-10-CM | POA: Insufficient documentation

## 2012-06-12 ENCOUNTER — Encounter (HOSPITAL_COMMUNITY): Payer: Medicare Other

## 2012-06-12 ENCOUNTER — Telehealth (HOSPITAL_COMMUNITY): Payer: Self-pay | Admitting: Cardiac Rehabilitation

## 2012-06-12 NOTE — Telephone Encounter (Signed)
pc to assess pt post operative recovery since nerve stimulator change out.  Pt states she has been cleared by her surgeon releasing her to return to cardiac rehab.  She received letter from neurologist.  She will take letter to Dr. Shirlee Latch for his information.  Will send message to Dr. Shirlee Latch for cardiology clearance to return to cardiac rehab.  Pt states she uses a magnet with new stimulator which she will provide.

## 2012-06-14 ENCOUNTER — Encounter (HOSPITAL_COMMUNITY)
Admission: RE | Admit: 2012-06-14 | Discharge: 2012-06-14 | Disposition: A | Discharge status: Home / Self Care | Payer: Medicare Other | Source: Ambulatory Visit | Attending: Cardiology | Admitting: Cardiology

## 2012-06-14 DIAGNOSIS — R569 Unspecified convulsions: Secondary | ICD-10-CM | POA: Insufficient documentation

## 2012-06-14 DIAGNOSIS — I209 Angina pectoris, unspecified: Secondary | ICD-10-CM | POA: Insufficient documentation

## 2012-06-14 DIAGNOSIS — R0789 Other chest pain: Secondary | ICD-10-CM | POA: Insufficient documentation

## 2012-06-14 DIAGNOSIS — Z823 Family history of stroke: Secondary | ICD-10-CM | POA: Insufficient documentation

## 2012-06-14 DIAGNOSIS — I10 Essential (primary) hypertension: Secondary | ICD-10-CM | POA: Insufficient documentation

## 2012-06-14 DIAGNOSIS — J45909 Unspecified asthma, uncomplicated: Secondary | ICD-10-CM | POA: Insufficient documentation

## 2012-06-14 DIAGNOSIS — Z5189 Encounter for other specified aftercare: Secondary | ICD-10-CM | POA: Insufficient documentation

## 2012-06-14 DIAGNOSIS — K219 Gastro-esophageal reflux disease without esophagitis: Secondary | ICD-10-CM | POA: Insufficient documentation

## 2012-06-14 DIAGNOSIS — I2510 Atherosclerotic heart disease of native coronary artery without angina pectoris: Secondary | ICD-10-CM | POA: Insufficient documentation

## 2012-06-14 DIAGNOSIS — E669 Obesity, unspecified: Secondary | ICD-10-CM | POA: Insufficient documentation

## 2012-06-14 DIAGNOSIS — Z8249 Family history of ischemic heart disease and other diseases of the circulatory system: Secondary | ICD-10-CM | POA: Insufficient documentation

## 2012-06-14 DIAGNOSIS — E785 Hyperlipidemia, unspecified: Secondary | ICD-10-CM | POA: Insufficient documentation

## 2012-06-14 DIAGNOSIS — E039 Hypothyroidism, unspecified: Secondary | ICD-10-CM | POA: Insufficient documentation

## 2012-06-14 NOTE — Progress Notes (Signed)
Pt returned to cardiac rehab today after extended absence for nerve stimulator change out.  Pt tolerated light exercise without difficulty.  Pt reports she did not take her NTG SL prior to exercise today. Pt admits she did have mild discomfort with increased exertion which was relieved with decreased intensity.  Pt states she did premedicate with her inhaler which prevented dyspnea on exertion.  Pt weight up 1.2kg since last visit on 05/10/12.  Pt asymptomatic and reports she actually feels much better with medication adjustments per cardiology.  Will continue to monitor.

## 2012-06-17 ENCOUNTER — Encounter (HOSPITAL_COMMUNITY)
Admission: RE | Admit: 2012-06-17 | Discharge: 2012-06-17 | Disposition: A | Discharge status: Home / Self Care | Payer: Medicare Other | Source: Ambulatory Visit | Attending: Cardiology | Admitting: Cardiology

## 2012-06-19 ENCOUNTER — Encounter (HOSPITAL_COMMUNITY): Payer: Medicare Other

## 2012-06-21 ENCOUNTER — Encounter (HOSPITAL_COMMUNITY)
Admission: RE | Admit: 2012-06-21 | Discharge: 2012-06-21 | Disposition: A | Discharge status: Home / Self Care | Payer: Medicare Other | Source: Ambulatory Visit | Attending: Cardiology | Admitting: Cardiology

## 2012-06-24 ENCOUNTER — Encounter (HOSPITAL_COMMUNITY): Payer: Medicare Other

## 2012-06-26 ENCOUNTER — Encounter (HOSPITAL_COMMUNITY): Payer: Medicare Other

## 2012-06-28 ENCOUNTER — Encounter (HOSPITAL_COMMUNITY): Payer: Medicare Other

## 2012-06-28 ENCOUNTER — Telehealth (HOSPITAL_COMMUNITY): Payer: Self-pay | Admitting: Cardiac Rehabilitation

## 2012-06-28 NOTE — Telephone Encounter (Signed)
pc to advise pt end of 18 week period for cardiac rehab.  Pt states she was absent this week for school holiday and weather.  Pt is disappointed her cardiac rehab time is over.  Pt undecided about participating in cardiac maintenance program.  Information mailed to pt.

## 2012-07-01 ENCOUNTER — Ambulatory Visit: Payer: Self-pay | Admitting: Cardiology

## 2012-07-02 ENCOUNTER — Ambulatory Visit: Payer: Self-pay | Admitting: Cardiology

## 2012-07-08 ENCOUNTER — Encounter: Payer: Self-pay | Admitting: Physician Assistant

## 2012-07-08 ENCOUNTER — Ambulatory Visit (INDEPENDENT_AMBULATORY_CARE_PROVIDER_SITE_OTHER): Payer: Medicare Other | Admitting: Physician Assistant

## 2012-07-08 VITALS — BP 118/74 | HR 80 | Ht 65.5 in | Wt 239.2 lb

## 2012-07-08 DIAGNOSIS — M791 Myalgia, unspecified site: Secondary | ICD-10-CM

## 2012-07-08 DIAGNOSIS — R609 Edema, unspecified: Secondary | ICD-10-CM

## 2012-07-08 DIAGNOSIS — I10 Essential (primary) hypertension: Secondary | ICD-10-CM

## 2012-07-08 DIAGNOSIS — R079 Chest pain, unspecified: Secondary | ICD-10-CM

## 2012-07-08 DIAGNOSIS — IMO0001 Reserved for inherently not codable concepts without codable children: Secondary | ICD-10-CM

## 2012-07-08 DIAGNOSIS — R0602 Shortness of breath: Secondary | ICD-10-CM

## 2012-07-08 LAB — BASIC METABOLIC PANEL
BUN: 16 mg/dL (ref 6–23)
CO2: 22 mEq/L (ref 19–32)
Calcium: 8.9 mg/dL (ref 8.4–10.5)
Chloride: 113 mEq/L — ABNORMAL HIGH (ref 96–112)
Creatinine, Ser: 0.9 mg/dL (ref 0.4–1.2)
GFR: 90.42 mL/min (ref >60.00)
Glucose, Bld: 94 mg/dL (ref 70–99)
Potassium: 3.8 mEq/L (ref 3.5–5.1)
Sodium: 141 mEq/L (ref 135–145)

## 2012-07-08 LAB — BRAIN NATRIURETIC PEPTIDE: Pro B Natriuretic peptide (BNP): 18 pg/mL (ref 0.0–100.0)

## 2012-07-08 LAB — CK: Total CK: 215 U/L — ABNORMAL HIGH (ref 7–177)

## 2012-07-08 NOTE — Patient Instructions (Addendum)
Your physician wants you to follow-up in: 4 months with DR. MCLEAN You will receive a reminder letter in the mail two months in advance. If you don't receive a letter, please call our office to schedule the follow-up appointment.  Your physician recommends that you return for lab work in: TODAY; BMET, BNP , TOTAL CK

## 2012-07-08 NOTE — Progress Notes (Signed)
[address] [place], [place]  [address] Phone: [phone], Fax:  [phone]  Date:  07/08/2012   ID:  [name], [name] [birth year], MRN [id number]  PCP:  Genia Hotter, MD  Primary Cardiologist:  Dr. Marca Ancona     History of Present Illness: [name] is a 48 y.o. female who returns for follow up.  She has a hx of chest pain and prior negative caths as well as migraines and HTN. She has had chest pain chronically over a number of years. She had an abnormal stress test in [place] in 2008. She was then sent for a cardiac cath, which she says was normal. ETT-myoview in this office in 11/12 that showed ischemic-type ST depression on exercise ECG, but there was no evidence for ischemia on myoview images. EF was 66%. Given ongoing chest pain and ischemic ECG changes with normal perfusion images (equivocal study), Coronary CT angiogram was done. This was a difficult study because we were not able to get her HR as low as would be ideal. However, she had no coronary calcium and no definite stenosis was seen (though mid-RCA was not fully evaluated due to artifact). Dr. Shirlee Latch felt that this was a negative study. In 5/13, she went to the ER in [place] with particularly bad exertional chest pain that lasted a prolonged time. Troponin was mildly elevated with normal CKMB. She had a left heart cath in the hospital and [place] which showed no angiographic CAD. She was sent home on Imdur and amlodipine. Echo showed normal EF. Dr. 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 She seems to have improved somewhat but still has chest pain with heavy exertion and still occasionally uses NTG.   I saw her in December prior to replacement of vagal nerve stimulator for her seizures at [facility] 05/22/12.  Patient had myalgias and her statin was stopped.  CPK remained elevated and she was asked to f/u with  her PCP for evaluation.  She had a stitch that got infected after her nerve stimulator replacement that required antibiotics.  She notes stable chest pain. She has not been that active since her surgery.  She notes that taking NTG prior to activity does help with her symptoms.  LE edema is fairly stable.  She takes Lasix PRN infrequently.  She notes Class IIb DOE.  No syncope.  She denies PND.    Labs (9/12):     creatinine 0.9  Labs (11/12):   LDL 130, HDL 49, TSH normal, K 3.6, creatinine 0.8, Lp(a) 31  Labs (5/13):     K 4, creatinine 0.7, LDL 91, TnI 0.86 => 0.98 with normal CKMB  Labs (7/13):     negative troponin  Labs (12/13):   TSH 0.76, proBNP 10, Alb 3.6, K 3.8, creatinine 0.9, CPK 334 -> 353   Wt Readings from Last 3 Encounters:  07/08/12 239 lb 3.2 oz (108.5 kg)  05/13/12 230 lb 12.8 oz (104.69 kg)  05/09/12 235 lb (106.595 kg)    Past Medical History:  1. Chest pain: Multiple episodes over the years. She had an abnormal stress test in 2008 in [place] and it sounds like she went to [facility] for a cardiac catheterization which per her report was normal. Stress echo at [facility] in 2010 was submaximal but showed no evidence for ischemia. ETT-myoview (11/12): 7'15", EF 66%, significant ST depression on exercise ECG, apical thinning  on myoview images but no evidence for ischemia. Coronary CT angiogram (11/12): calcium score 0, difficult study with gating artifact, mid RCA nonevaluatable but no stenosis elsewhere in the coronaries. Echo (11/12): EF 55-60%, mild LV hypertrophy, mild MR. NSTEMI in [place] in 5/13 with elevated troponin but LHC showed normal coronaries. Echo (5/13) with EF 60-65%, normal valve, no LVH. 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  2. Migraines: Uses triptan medications.  3. HTN  4. Seizure disorder: Has vagus nerve stimulator.  5. Hypothyroidism  6. Asthma since childhood    Current Outpatient Prescriptions  Medication Sig Dispense Refill    . budesonide-formoterol (SYMBICORT) 160-4.5 MCG/ACT inhaler Inhale 2 puffs into the lungs 2 (two) times daily.      . Coenzyme Q10 200 MG TABS Take by mouth.    0  . furosemide (LASIX) 20 MG tablet TAKE 40 MG X 3 DAYS THEN STOP; MAY TAKE 20 MG PRN FOR EDEMA      . isosorbide mononitrate (IMDUR) 60 MG 24 hr tablet TAKE 1 TABLET (60 MG TOTAL) BY MOUTH 2 (TWO) TIMES DAILY.  60 tablet  6  . levETIRAcetam (KEPPRA) 500 MG tablet Take 500 mg by mouth 2 (two) times daily.      Marland Kitchen levothyroxine (SYNTHROID, LEVOTHROID) 50 MCG tablet Take 50 mcg by mouth daily.        . metoprolol tartrate (LOPRESSOR) 25 MG tablet Take 12.5 mg by mouth 2 (two) times daily.       . nitroGLYCERIN (NITROSTAT) 0.4 MG SL tablet Place 1 tablet (0.4 mg total) under the tongue every 5 (five) minutes as needed.  100 tablet  3  . olmesartan (BENICAR) 20 MG tablet Take 1 tablet (20 mg total) by mouth daily.  30 tablet  6  . omeprazole (PRILOSEC) 20 MG capsule Take 40 mg by mouth daily.      . pantoprazole (PROTONIX) 40 MG tablet Take 40 mg by mouth daily.      . potassium chloride (K-DUR) 10 MEQ tablet TAKE 20 MEQ x 3 DAYS THEN STOP; MAY TAKE 10 MEQ PRN WITH LASIX      . ranolazine (RANEXA) 500 MG 12 hr tablet Take 1 tablet (500 mg total) by mouth 2 (two) times daily.  60 tablet  6  . SUMAtriptan (IMITREX) 100 MG tablet Take 100 mg by mouth every 2 (two) hours as needed. For migraines      . topiramate (TOPAMAX) 50 MG tablet Take 100 mg by mouth 2 (two) times daily.         Allergies:    Allergies  Allergen Reactions  . Aspirin     Irritates stomach  . Amitriptyline Swelling  . Amlodipine Swelling  . Latex Itching and Swelling    Whelps, burning    Social History:  The patient  reports that she has never smoked. She does not have any smokeless tobacco history on file.   ROS:  Please see the history of present illness.   Myalgias remain improved.  She never saw her PCP to follow up on elevated CPK.   All other systems  reviewed and negative.   PHYSICAL EXAM: VS:  BP 118/74  Pulse 80  Ht 5' 5.5" (1.664 m)  Wt 239 lb 3.2 oz (108.5 kg)  BMI 39.20 kg/m2 Well nourished, well developed, in no acute distress HEENT: normal Neck: no JVD Cardiac:  normal S1, S2; RRR; no murmur Lungs:  clear to auscultation bilaterally, no wheezing, rhonchi or rales Abd: soft,  nontender, no hepatomegaly Ext: no edema Skin: warm and dry Neuro:  CNs 2-12 intact, no focal abnormalities noted  EKG:  NSR, HR 80, NSSTTW changes, QTc 426 ms     ASSESSMENT AND PLAN:  1. Chest Pain:  This remains stable.  Continue current therapy.  2. Dyspnea:  Likely related to deconditioning in combination with asthma. She has complaints of occasional edema. Check BMET and BNP today.  She is checking into maintenance cardiac rehab.   3. Hypertension:  Controlled.  Continue current therapy.  4. Myalgias:  She had persistent mildly elevated CPK off of statin.  Remain off of statin.  Repeat CPK today.  Follow up with PCP if remains elevated.  5. Disposition:  Follow up with Dr. Marca Ancona in 4 mos.    Signed, Tereso Newcomer, PA-C  9:16 AM 07/08/2012

## 2012-07-09 ENCOUNTER — Telehealth: Payer: Self-pay | Admitting: *Deleted

## 2012-07-09 ENCOUNTER — Encounter: Payer: Self-pay | Admitting: *Deleted

## 2012-07-09 NOTE — Patient Instructions (Signed)
Lab results faxed to Dr. Clydie Braun today

## 2012-07-09 NOTE — Telephone Encounter (Signed)
Message copied by Tarri Fuller on Tue Jul 09, 2012  5:12 PM ------      Message from: [place], [place] T      Created: Mon Jul 08, 2012  2:40 PM       Labs ok except total CK remains somewhat elevated.      Total CK level is not that high but remains above normal range.      I still want her to follow up with her PCP to review this for further recommendations, if any.      Tereso Newcomer, PA-C  2:39 PM 07/08/2012

## 2012-07-09 NOTE — Telephone Encounter (Signed)
pt notified about lab results with verbal understanding today. pt advised to f/u w/PCP in relation elevated Total CK Enzymes; pt asked for results to be faxed to PCP Dr. Clydie Braun in [place],

## 2012-07-12 ENCOUNTER — Other Ambulatory Visit: Payer: Self-pay | Admitting: Cardiology

## 2012-08-28 ENCOUNTER — Emergency Department (HOSPITAL_COMMUNITY): Payer: Medicare Other

## 2012-08-28 ENCOUNTER — Emergency Department (HOSPITAL_COMMUNITY)
Admission: EM | Admit: 2012-08-28 | Discharge: 2012-08-28 | Disposition: A | Discharge status: Home / Self Care | Payer: Medicare Other | Attending: Emergency Medicine | Admitting: Emergency Medicine

## 2012-08-28 ENCOUNTER — Encounter (HOSPITAL_COMMUNITY): Payer: Self-pay | Admitting: *Deleted

## 2012-08-28 DIAGNOSIS — G43909 Migraine, unspecified, not intractable, without status migrainosus: Secondary | ICD-10-CM | POA: Insufficient documentation

## 2012-08-28 DIAGNOSIS — M7989 Other specified soft tissue disorders: Secondary | ICD-10-CM | POA: Insufficient documentation

## 2012-08-28 DIAGNOSIS — I10 Essential (primary) hypertension: Secondary | ICD-10-CM | POA: Insufficient documentation

## 2012-08-28 DIAGNOSIS — J45901 Unspecified asthma with (acute) exacerbation: Secondary | ICD-10-CM | POA: Insufficient documentation

## 2012-08-28 DIAGNOSIS — Z9861 Coronary angioplasty status: Secondary | ICD-10-CM | POA: Insufficient documentation

## 2012-08-28 DIAGNOSIS — E039 Hypothyroidism, unspecified: Secondary | ICD-10-CM | POA: Insufficient documentation

## 2012-08-28 DIAGNOSIS — E669 Obesity, unspecified: Secondary | ICD-10-CM | POA: Insufficient documentation

## 2012-08-28 DIAGNOSIS — Z8614 Personal history of Methicillin resistant Staphylococcus aureus infection: Secondary | ICD-10-CM | POA: Insufficient documentation

## 2012-08-28 DIAGNOSIS — G40909 Epilepsy, unspecified, not intractable, without status epilepticus: Secondary | ICD-10-CM | POA: Insufficient documentation

## 2012-08-28 DIAGNOSIS — M79609 Pain in unspecified limb: Secondary | ICD-10-CM | POA: Insufficient documentation

## 2012-08-28 DIAGNOSIS — I252 Old myocardial infarction: Secondary | ICD-10-CM | POA: Insufficient documentation

## 2012-08-28 DIAGNOSIS — Z79899 Other long term (current) drug therapy: Secondary | ICD-10-CM | POA: Insufficient documentation

## 2012-08-28 DIAGNOSIS — R5381 Other malaise: Secondary | ICD-10-CM | POA: Insufficient documentation

## 2012-08-28 DIAGNOSIS — K219 Gastro-esophageal reflux disease without esophagitis: Secondary | ICD-10-CM | POA: Insufficient documentation

## 2012-08-28 HISTORY — DX: Acute myocardial infarction, unspecified: I21.9

## 2012-08-28 LAB — CBC WITH DIFFERENTIAL/PLATELET
Basophils Absolute: 0 10*3/uL (ref 0.0–0.1)
Basophils Relative: 1 % (ref 0–1)
Eosinophils Absolute: 0.4 10*3/uL (ref 0.0–0.7)
Eosinophils Relative: 8 % — ABNORMAL HIGH (ref 0–5)
HCT: 35.4 % — ABNORMAL LOW (ref 36.0–46.0)
Hemoglobin: 11.8 g/dL — ABNORMAL LOW (ref 12.0–15.0)
Lymphocytes Relative: 45 % (ref 12–46)
Lymphs Abs: 2.6 10*3/uL (ref 0.7–4.0)
MCH: 27.3 pg (ref 26.0–34.0)
MCHC: 33.3 g/dL (ref 30.0–36.0)
MCV: 81.8 fL (ref 78.0–100.0)
Monocytes Absolute: 0.5 10*3/uL (ref 0.1–1.0)
Monocytes Relative: 8 % (ref 3–12)
Neutro Abs: 2.2 10*3/uL (ref 1.7–7.7)
Neutrophils Relative %: 39 % — ABNORMAL LOW (ref 43–77)
Platelets: 201 10*3/uL (ref 150–400)
RBC: 4.33 MIL/uL (ref 3.87–5.11)
RDW: 14.6 % (ref 11.5–15.5)
WBC: 5.8 10*3/uL (ref 4.0–10.5)

## 2012-08-28 LAB — POCT I-STAT, CHEM 8
BUN: 12 mg/dL (ref 6–23)
Calcium, Ion: 1.25 mmol/L — ABNORMAL HIGH (ref 1.12–1.23)
Chloride: 111 mEq/L (ref 96–112)
Creatinine, Ser: 0.8 mg/dL (ref 0.50–1.10)
Glucose, Bld: 101 mg/dL — ABNORMAL HIGH (ref 70–99)
HCT: 37 % (ref 36.0–46.0)
Hemoglobin: 12.6 g/dL (ref 12.0–15.0)
Potassium: 3.9 mEq/L (ref 3.5–5.1)
Sodium: 141 mEq/L (ref 135–145)
TCO2: 20 mmol/L (ref 0–100)

## 2012-08-28 LAB — POCT I-STAT TROPONIN I: Troponin i, poc: 0 ng/mL (ref 0.00–0.08)

## 2012-08-28 LAB — PRO B NATRIURETIC PEPTIDE: Pro B Natriuretic peptide (BNP): 29.2 pg/mL (ref 0–125)

## 2012-08-28 NOTE — ED Notes (Signed)
Pt had picc line (for tx of MRSA) removed last Tues and continues to have upper arm pain and swelling.  Swelling and redness has reduced, however pt states pain is increasing and moving up her arm and to her neck.  No redness/warmth/drainage.  Pt states there was swelling, but she has been elevating arm and it has reduced swelling. Pt sent here by [facility] to r/o dvt.

## 2012-08-28 NOTE — Progress Notes (Signed)
VASCULAR LAB PRELIMINARY  PRELIMINARY  PRELIMINARY  PRELIMINARY  Right upper extremity venous duplex completed.    Preliminary report:  No obvious evidence of right upper extremity DVT or superficial thrombosis  Jae Skeet, RVS 08/28/2012, 8:17 PM

## 2012-08-28 NOTE — ED Notes (Signed)
Doppler technician reports that vascular study is negative.

## 2012-08-28 NOTE — ED Notes (Signed)
Vascular here at bedside.

## 2012-08-28 NOTE — ED Notes (Signed)
Patient report obtained from [facility]ok, care taken over at this time.  

## 2012-08-28 NOTE — ED Notes (Signed)
Patient given discharge paperwork; went over discharge instructions with patient.  Patient instructed to follow up with primary care physician, as discussed with EDP and to return to the ED for new, worsening, or concerning symptoms.

## 2012-08-28 NOTE — ED Provider Notes (Signed)
History     CSN: [id number]  Arrival date & time 08/28/12  1703   First MD Initiated Contact with Patient 08/28/12 1738      Chief Complaint  Patient presents with  . Arm Pain    (Consider location/radiation/quality/duration/timing/severity/associated sxs/prior treatment) Patient is a 47 y.o. female presenting with arm pain. The history is provided by the patient.  Arm Pain Associated symptoms include shortness of breath. Pertinent negatives include no chest pain, no abdominal pain and no headaches.   patient was reportedly sent in to rule out blood clot in her right upper extremity. She had a PICC line for MRSA at the site of her vagus nerve stimulator. It was removed earlier this week. She states while was there she had swelling and pain in the right arm. It is continued somewhat after removal. No fevers. No cough. She was switched to Bactrim from her other antibiotics. She also has had increased swelling in both of her legs. She states she has had increasing difficulty breathing also. No chest pain. No fevers. No cough. She has a history of a previous heart attack.  Past Medical History  Diagnosis Date  . HTN (hypertension)   . Seizure disorder     since childhood.  . Chest pain 05/15/11, 05/16/11  . Migraine   . Asthma   . Hypothyroid   . GERD (gastroesophageal reflux disease)   . Obesity   . MI (myocardial infarction)     Past Surgical History  Procedure Laterality Date  . Cardiac catheterization      [facility]   . Cesarean section      placement of vagal nerve stimulator.  . Implantation vagal nerve stimulator      Family History  Problem Relation Age of Onset  . Coronary artery disease Mother 50  . Coronary artery disease Maternal Grandmother   . Cancer Other   . Hypertension Other   . Stroke Other     History  Substance Use Topics  . Smoking status: Never Smoker   . Smokeless tobacco: Not on file  . Alcohol Use: No    OB History   Grav  Para Term Preterm Abortions TAB SAB Ect Mult Living                  Review of Systems  Constitutional: Positive for fatigue. Negative for activity change and appetite change.  HENT: Negative for neck stiffness.   Eyes: Negative for pain.  Respiratory: Positive for shortness of breath. Negative for chest tightness.   Cardiovascular: Positive for leg swelling. Negative for chest pain.  Gastrointestinal: Negative for nausea, vomiting, abdominal pain and diarrhea.  Genitourinary: Negative for flank pain.  Musculoskeletal: Negative for back pain.  Skin: Negative for rash.  Neurological: Negative for weakness, numbness and headaches.  Psychiatric/Behavioral: Negative for behavioral problems.    Allergies  Aspirin; Amantadines; Amitriptyline; Amlodipine; Latex; and Simvastatin  Home Medications   Current Outpatient Rx  Name  Route  Sig  Dispense  Refill  . budesonide-formoterol (SYMBICORT) 160-4.5 MCG/ACT inhaler   Inhalation   Inhale 2 puffs into the lungs 2 (two) times daily.         . Coenzyme Q10 200 MG TABS   Oral   Take 1 tablet by mouth every evening. Hold while in hospital      0   . furosemide (LASIX) 20 MG tablet   Oral   Take 20 mg by mouth daily. TAKE 40 MG X 3 DAYS  THEN STOP; MAY TAKE 20 MG PRN FOR EDEMA         . isosorbide mononitrate (IMDUR) 60 MG 24 hr tablet      TAKE 1 TABLET (60 MG TOTAL) BY MOUTH 2 (TWO) TIMES DAILY.   60 tablet   6   . levETIRAcetam (KEPPRA) 500 MG tablet   Oral   Take 500 mg by mouth 2 (two) times daily.         Marland Kitchen levothyroxine (SYNTHROID, LEVOTHROID) 50 MCG tablet   Oral   Take 50 mcg by mouth daily.          . metoprolol tartrate (LOPRESSOR) 25 MG tablet   Oral   Take 12.5 mg by mouth 2 (two) times daily.          . nitroGLYCERIN (NITROSTAT) 0.4 MG SL tablet   Sublingual   Place 0.4 mg under the tongue every 5 (five) minutes as needed. For chest pain         . olmesartan (BENICAR) 20 MG tablet   Oral    Take 1 tablet (20 mg total) by mouth daily.   30 tablet   6   . omeprazole (PRILOSEC) 20 MG capsule   Oral   Take 40 mg by mouth daily.         . pantoprazole (PROTONIX) 40 MG tablet   Oral   Take 40 mg by mouth daily.         . potassium chloride (K-DUR) 10 MEQ tablet   Oral   Take 10 mEq by mouth daily. TAKE 20 MEQ x 3 DAYS THEN STOP; MAY TAKE 10 MEQ PRN WITH LASIX         . sulfamethoxazole-trimethoprim (BACTRIM,SEPTRA) 400-80 MG per tablet   Oral   Take 1 tablet by mouth 2 (two) times daily.         . SUMAtriptan (IMITREX) 100 MG tablet   Oral   Take 100 mg by mouth every 2 (two) hours as needed. For migraines         . topiramate (TOPAMAX) 50 MG tablet   Oral   Take 100 mg by mouth 2 (two) times daily.            BP 118/75  Pulse 92  Temp(Src) 98.7 F (37.1 C) (Oral)  Resp 22  SpO2 100%  LMP 08/04/2012  Physical Exam  Nursing note and vitals reviewed. Constitutional: She is oriented to person, place, and time. She appears well-developed and well-nourished.  HENT:  Head: Normocephalic and atraumatic.  Eyes: EOM are normal. Pupils are equal, round, and reactive to light.  Neck: Normal range of motion. Neck supple.  Cardiovascular: Normal rate, regular rhythm and normal heart sounds.   No murmur heard. Pulmonary/Chest: Effort normal and breath sounds normal. No respiratory distress. She has no wheezes. She has no rales.  Vagus nerve stimulator to left chest wall. No erythema.  Abdominal: Soft. Bowel sounds are normal. She exhibits no distension. There is no tenderness. There is no rebound and no guarding.  Musculoskeletal: Normal range of motion.  Mild bilateral lower extremity pitting edema. Right upper arm has previous PICC line. She is weak radial pulses bilaterally. There is no erythema at the site of the previous PICC line. No clear fluctuance or swelling.  Neurological: She is alert and oriented to person, place, and time. No cranial nerve  deficit.  Skin: Skin is warm and dry.  Psychiatric: She has a normal mood and affect. Her speech  is normal.    ED Course  Procedures (including critical care time)  Labs Reviewed  CBC WITH DIFFERENTIAL - Abnormal; Notable for the following:    Hemoglobin 11.8 (*)    HCT 35.4 (*)    Neutrophils Relative 39 (*)    Eosinophils Relative 8 (*)    All other components within normal limits  POCT I-STAT, CHEM 8 - Abnormal; Notable for the following:    Glucose, Bld 101 (*)    Calcium, Ion 1.25 (*)    All other components within normal limits  PRO B NATRIURETIC PEPTIDE  POCT I-STAT TROPONIN I   Dg Chest 2 View  08/28/2012  *RADIOLOGY REPORT*  Clinical Data: Chest pain.  CHEST - 2 VIEW  Comparison: 05/10/2012.  Findings: The cardiac silhouette, mediastinal and hilar contours are normal and stable.  The lungs are clear.  Stable nerve stimulator.  The bony thorax is intact.  IMPRESSION: No acute cardiopulmonary findings.   Original Report Authenticated By: Rudie Meyer, M.D.      1. Upper extremity pain, right     Date: 08/29/2012  Rate: 79  Rhythm: normal sinus rhythm  QRS Axis: normal  Intervals: normal  ST/T Wave abnormalities: nonspecific ST/T changes  Conduction Disutrbances:none  Narrative Interpretation:   Old EKG Reviewed: unchanged     MDM  Patient presents with pain in her right arm. Recent PICC line removal. Negative Doppler for DVT. Also has had some shortness of breath that has been improving recently. May be related to her CHF. She recently has had her Lasix dose increased. X-ray does not show CHF. I doubt this is pulmonary embolism. Patient's breathing has been improving. She will be discharged home        Juliet Rude. Rubin Payor, MD 08/29/12 [phone]

## 2012-08-28 NOTE — ED Notes (Signed)
Able to speak full sentences with no SOB. NAD at this time.

## 2012-08-28 NOTE — ED Notes (Signed)
Patient laying on stretcher at this time. No acute distress noted, resp are even and unlabored. Patient complaining of pain in right upper arm, pain 5/10 at this time. Plan of care discussed. Call light at bedside. Will continue to monitor.

## 2012-08-28 NOTE — ED Notes (Signed)
Patient resting on stretcher at this time. No acute distress, resp are even and unlabored. Call light on bed. Will continue to monitor.

## 2012-08-28 NOTE — ED Notes (Signed)
Vascular reports that scan was negative.

## 2012-08-30 DIAGNOSIS — M79609 Pain in unspecified limb: Secondary | ICD-10-CM

## 2012-09-11 ENCOUNTER — Other Ambulatory Visit: Payer: Self-pay | Admitting: Cardiology

## 2012-09-16 ENCOUNTER — Telehealth: Payer: Self-pay | Admitting: Cardiology

## 2012-09-16 ENCOUNTER — Other Ambulatory Visit: Payer: Self-pay | Admitting: *Deleted

## 2012-09-16 NOTE — Telephone Encounter (Signed)
New Problem:    Patient called in because within the past three months she has gained 25lbs worth of fluid.  Patient is having SOB and would like to be seen soon.  Please call back.

## 2012-09-16 NOTE — Telephone Encounter (Signed)
Pt c/o of arm pain and swelling s/p picc line removal.  Pt states that her arm is very sore and swollen and that her heart feels like it is racing and skipping beats.  Advised pt thet Dr. Alford Highland next available appt is in July 2014.  She has appt with her PCP next week.  Advised pt that we will make her an appt with Dr. Shirlee Latch but she should not wait around till July with her complaints today, she should go to the ED.  Pt agreed.  Will ask Dr. Shirlee Latch if ok to [facility] anytime between now and July.

## 2012-09-16 NOTE — Telephone Encounter (Signed)
[name] [name]

## 2012-09-17 NOTE — Telephone Encounter (Signed)
Forwarded to [facility] in scheduling to make overbook appt per Dr. Shirlee Latch.

## 2012-09-18 ENCOUNTER — Encounter: Payer: Self-pay | Admitting: *Deleted

## 2012-09-18 ENCOUNTER — Encounter: Payer: Self-pay | Admitting: Cardiology

## 2012-09-18 ENCOUNTER — Ambulatory Visit (INDEPENDENT_AMBULATORY_CARE_PROVIDER_SITE_OTHER): Payer: Medicare Other | Admitting: Cardiology

## 2012-09-18 VITALS — BP 120/64 | HR 85 | Ht 65.5 in | Wt 244.0 lb

## 2012-09-18 DIAGNOSIS — E785 Hyperlipidemia, unspecified: Secondary | ICD-10-CM

## 2012-09-18 DIAGNOSIS — I509 Heart failure, unspecified: Secondary | ICD-10-CM

## 2012-09-18 DIAGNOSIS — R079 Chest pain, unspecified: Secondary | ICD-10-CM

## 2012-09-18 DIAGNOSIS — R0600 Dyspnea, unspecified: Secondary | ICD-10-CM

## 2012-09-18 DIAGNOSIS — R0989 Other specified symptoms and signs involving the circulatory and respiratory systems: Secondary | ICD-10-CM

## 2012-09-18 DIAGNOSIS — E7800 Pure hypercholesterolemia, unspecified: Secondary | ICD-10-CM

## 2012-09-18 DIAGNOSIS — R0602 Shortness of breath: Secondary | ICD-10-CM

## 2012-09-18 DIAGNOSIS — R0609 Other forms of dyspnea: Secondary | ICD-10-CM

## 2012-09-18 MED ORDER — ASPIRIN EC 81 MG PO TBEC: 81.0000 mg | DELAYED_RELEASE_TABLET | Freq: Every day | ORAL | Status: DC

## 2012-09-18 NOTE — Patient Instructions (Addendum)
Your physician has requested that you have a cardiac catheterization. Cardiac catheterization is used to diagnose and/or treat various heart conditions. Doctors may recommend this procedure for a number of different reasons. The most common reason is to evaluate chest pain. Chest pain can be a symptom of coronary artery disease (CAD), and cardiac catheterization can show whether plaque is narrowing or blocking your heart's arteries. This procedure is also used to evaluate the valves, as well as measure the blood flow and oxygen levels in different parts of your heart. For further information please visit [email or web address] Please follow instruction sheet, as given.  Your physician has requested that you have an echocardiogram. Echocardiography is a painless test that uses sound waves to create images of your heart. It provides your doctor with information about the size and shape of your heart and how well your heart's chambers and valves are working. This procedure takes approximately one hour. There are no restrictions for this procedure.  Your physician has recommended you make the following change in your medication:  1) Start ECASA(Aspirin) 81mg  daily   Your physician recommends that you return for lab work in: bmp/bnp/cbc/lipid/inr

## 2012-09-19 NOTE — Progress Notes (Signed)
Patient ID: [name], female   DOB: 1965/10/10, 47 y.o.   MRN: [id number] PCP: Dr. Parke Simmers [facility]), Dr. Clydie Braun Lacy Duverney)  47 yo with history of chest pain and prior negative caths as well as migraines, asthma, and HTN presents for cardiology followup.  She has had chest pain chronically over a number of years.  She had an abnormal stress test in [place] in 2008.  She was then sent for a cardiac cath, which she says was normal.  We had her do an ETT-myoview in this office in 11/12.  This showed ischemic-type ST depression on exercise ECG, but there was no evidence for ischemia on myoview images.  EF was 66%.  Given ongoing chest pain and ischemic ECG changes with normal perfusion images (equivocal study), I had her get a coronary CT angiogram.  This was a difficult study because we were not able to get her HR as low as would be ideal.  However, she had no coronary calcium and no definite stenosis was seen (though mid-RCA was not fully evaluated due to artifact).  I think that this was a negative study.  She continued to have chest tightness with walking long distances, climbing a flight of steps, and housework.  In 5/13, she went to the ER in [place] with particularly bad exertional chest pain that lasted a prolonged time.  Troponin was mildly elevated with normal CKMB.  She had a left heart cath in the hospital and [place] which showed no angiographic CAD.  She was sent home on Imdur and amlodipine.  Echo showed normal EF.  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  She stopped the amlodipine due to ankle swelling.  In 12/13, she had her vagal nerve stimulator replaced at [facility].  This was complicated by a surgical site MRSA infection, and she had a PICC line placed for antibiotics.  Ranolazine was stopped at [facility] while she was getting antibiotics.  She did not notice much change in her chest pain pattern off ranolazine.  She has had  significant right arm pain since the PICC was removed and has had an ultrasound that did not show a DVT.    Recently, her chest pain, which can be both exertional and nonexertional, has been stable compared to the past (no worse off ranolazine).  She is concerned about swelling in her fingers and ankles.  Her main concern is exertional dyspnea.  She has noted dyspnea that has worsened over the last few months.  She can get short of breath just walking around her house.  She reports orthopnea (sleeps on 3 pillows).  She has been gaining weight (up 5 lbs since last appointment).  BNP, of note, has not been elevated.  She has been taking Lasix 20 mg daily, which has helped with the swelling.  Patient has stopped her statin due to muscle pain.  Her CPK level has been mildly elevated.    Labs (9/12): creatinine 0.9 Labs (11/12): LDL 130, HDL 49, TSH normal, K 3.6, creatinine 0.8, Lp(a) 31 Labs (5/13): K 4, creatinine 0.7, LDL 91, TnI 0.86 => 0.98 with normal CKMB Labs (7/13): negative troponin Labs (12/13): TSH normal Labs (2/14): CPK 215, K 3.8, creatinine 0.9 Labs (3/14): BNP 29  ECG: NSR, nonspecific T wave flattening.  PMH:  1. Chest pain: Multiple episodes over the years.  She had an abnormal stress test in 2008 in [place] and it sounds like she went to [place]  for a cardiac catheterization which per her report was normal.   Stress echo at [facility] in 2010 was submaximal but showed no evidence for ischemia.  ETT-myoview (11/12): 7'15", EF 66%, significant ST depression on exercise ECG, apical thinning on myoview images but no evidence for ischemia. Coronary CT angiogram (11/12): calcium score 0, difficult study with gating artifact, mid RCA nonevaluatable but no stenosis elsewhere in the coronaries.   Echo (11/12): EF 55-60%, mild LV hypertrophy, mild MR.  NSTEMI in [place] in 5/13 with elevated troponin but LHC showed normal coronaries.  Echo (5/13) with EF 60-65%, normal valve, no LVH.   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  2. Migraines: Uses triptan medications.  3. HTN 4. Seizure disorder: Has vagus nerve stimulator.  5. Hypothyroidism 6. Asthma since childhood  SH: Moved to [place] from [place].  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   FH: Mother with MI at 79.  Grandmother with MI at 37.   ROS: All systems reviewed and negative except as per HPI.   Current Outpatient Prescriptions  Medication Sig Dispense Refill  . budesonide-formoterol (SYMBICORT) 160-4.5 MCG/ACT inhaler Inhale 2 puffs into the lungs 2 (two) times daily.      . Coenzyme Q10 200 MG TABS Take 1 tablet by mouth every evening. Hold while in hospital    0  . furosemide (LASIX) 20 MG tablet Take 20 mg by mouth daily. TAKE 40 MG X 3 DAYS THEN STOP; MAY TAKE 20 MG PRN FOR EDEMA      . isosorbide mononitrate (IMDUR) 60 MG 24 hr tablet TAKE 1 TABLET (60 MG TOTAL) BY MOUTH 2 (TWO) TIMES DAILY.  60 tablet  6  . levETIRAcetam (KEPPRA) 500 MG tablet Take 500 mg by mouth 2 (two) times daily.      Marland Kitchen levothyroxine (SYNTHROID, LEVOTHROID) 50 MCG tablet Take 50 mcg by mouth daily.       . metoprolol tartrate (LOPRESSOR) 25 MG tablet Take 12.5 mg by mouth 2 (two) times daily.       . nitroGLYCERIN (NITROSTAT) 0.4 MG SL tablet Place 0.4 mg under the tongue every 5 (five) minutes as needed. For chest pain      . olmesartan (BENICAR) 20 MG tablet Take 1 tablet (20 mg total) by mouth daily.  30 tablet  6  . omeprazole (PRILOSEC) 20 MG capsule Take 40 mg by mouth daily.      . pantoprazole (PROTONIX) 40 MG tablet Take 40 mg by mouth daily.      . potassium chloride (K-DUR) 10 MEQ tablet Take 10 mEq by mouth daily. TAKE 20 MEQ x 3 DAYS THEN STOP; MAY TAKE 10 MEQ PRN WITH LASIX      . sulfamethoxazole-trimethoprim (BACTRIM,SEPTRA) 400-80 MG per tablet Take 1 tablet by mouth 2 (two) times daily.      . SUMAtriptan (IMITREX) 100 MG tablet Take 100 mg by mouth every 2  (two) hours as needed. For migraines      . topiramate (TOPAMAX) 50 MG tablet Take 100 mg by mouth 2 (two) times daily.       Marland Kitchen aspirin EC 81 MG tablet Take 1 tablet (81 mg total) by mouth daily.  90 tablet  3   No current facility-administered medications for this visit.    BP 120/64  Pulse 85  Ht 5' 5.5" (1.664 m)  Wt 244 lb (110.678 kg)  BMI 39.97 kg/m2  SpO2 99%  LMP 08/04/2012 General: NAD,  overweight Neck: No JVD, no thyromegaly or thyroid nodule.  Lungs: Clear to auscultation bilaterally with normal respiratory effort. CV: Nondisplaced PMI.  Heart regular S1/S2, no S3/S4, 1/6 SEM.  Trace ankle edema.  No carotid bruit.  Normal pedal pulses.  Abdomen: Soft, nontender, no hepatosplenomegaly, no distention.  Neurologic: Alert and oriented x 3.  Psych: Normal affect. Extremities: No clubbing or cyanosis.   Assessment/Plan: 1. Chest pain: Extensive past workup.  Stable recently.  It is possible that she has microvascular angina (versus vasospasm).  She is not taking amlodipine due to lower extremity swelling and is not taking ranolazine since her recent course of antibiotics for MRSA.  I do not think she needs to restart ranolazine as stopping it did not make much difference with her symptoms.  She will continue metoprolol and Imdur.  I think that she probably ought to take ASA 81 mg daily.  2. Exertional dyspnea: This is currently her main complaint.  Symptoms described as NYHA class III.  Her weight is up and she reports extensive lower extremity edema.  Today, she does not seem to have much edema (she says it is improved since taking daily Lasix).  BNP has not been elevated but she is obese which could lead to a deceptively low BNP despite volume overload.  JVP is not elevated.  Given the significance of her symptoms, we discussed right heart cath to get definitive evidence for CHF/volume overload or pulmonary hypertension as a cause of her dyspnea.   We discussed risks/benefits of the  procedure and she agrees to proceed.  I will also repeat an echocardiogram.   3. Hyperlipidemia: She is off statin due to myalgias and mildly elevated CPK.  I will check lipids.    Marca Ancona 09/19/2012

## 2012-09-20 ENCOUNTER — Telehealth: Payer: Self-pay

## 2012-09-20 NOTE — Telephone Encounter (Signed)
Received call from Vernona Rieger at the cath lab stating that pts pre cath labs have not been drawn yet and her cath is scheduled on 4/22. Labs were ordered but not sheduled to be drawn until the 28th which is 5 days after cath. I left a message on pts VM to call office ASAP concerning her cath on Tuesday.

## 2012-09-23 NOTE — Telephone Encounter (Signed)
Spoke with patient to make her aware that we need to get labs drawn today for cath tomorrow.  Patient states she is working to get co-pay for tomorrow's procedure; she might have to reschedule.  Advised patient that if cath remains scheduled for tomorrow that we need labs today.  Patient states she is working on transportation and will call back to let me know whether or not she will be coming.

## 2012-09-23 NOTE — Telephone Encounter (Signed)
Attempted to call patient x 2 to discuss options for tomorrow's cath.  Awaiting patient's return call

## 2012-09-23 NOTE — Telephone Encounter (Signed)
Follow up   Pt returning your call. She wants to cancel her cath appt for tomorrow.

## 2012-09-23 NOTE — Telephone Encounter (Signed)
Routing message to Dr. Shirlee Latch to let him know patient left triage a message that she was not going to have cath on 4/22.  Patient is concerned about the co-pay associated with this procedure - she was told by her insurance company that she would have to pay $130.  I advised patient to have procedure and to set up a payment plan.  Patient stated that she would possibly have the money after 5/3 and would like to schedule a cath with Dr. Shirlee Latch after 5/3.  I left patient a message advising her that it is important to get the cath and that if she shows up for the procedure she will not be denied access.

## 2012-09-24 ENCOUNTER — Inpatient Hospital Stay (HOSPITAL_BASED_OUTPATIENT_CLINIC_OR_DEPARTMENT_OTHER): Admission: RE | Admit: 2012-09-24 | Payer: Medicare Other | Source: Ambulatory Visit | Admitting: Cardiology

## 2012-09-24 SURGERY — JV RIGHT HEART CATHETERIZATION

## 2012-09-30 ENCOUNTER — Ambulatory Visit (INDEPENDENT_AMBULATORY_CARE_PROVIDER_SITE_OTHER): Payer: Medicare Other | Admitting: *Deleted

## 2012-09-30 ENCOUNTER — Ambulatory Visit (HOSPITAL_COMMUNITY): Payer: Medicare Other | Attending: Cardiology

## 2012-09-30 DIAGNOSIS — R0600 Dyspnea, unspecified: Secondary | ICD-10-CM

## 2012-09-30 DIAGNOSIS — R0989 Other specified symptoms and signs involving the circulatory and respiratory systems: Secondary | ICD-10-CM | POA: Insufficient documentation

## 2012-09-30 DIAGNOSIS — J45909 Unspecified asthma, uncomplicated: Secondary | ICD-10-CM | POA: Insufficient documentation

## 2012-09-30 DIAGNOSIS — E785 Hyperlipidemia, unspecified: Secondary | ICD-10-CM

## 2012-09-30 DIAGNOSIS — R0609 Other forms of dyspnea: Secondary | ICD-10-CM | POA: Insufficient documentation

## 2012-09-30 DIAGNOSIS — R079 Chest pain, unspecified: Secondary | ICD-10-CM

## 2012-09-30 DIAGNOSIS — E669 Obesity, unspecified: Secondary | ICD-10-CM | POA: Insufficient documentation

## 2012-09-30 DIAGNOSIS — I10 Essential (primary) hypertension: Secondary | ICD-10-CM

## 2012-09-30 DIAGNOSIS — I509 Heart failure, unspecified: Secondary | ICD-10-CM | POA: Insufficient documentation

## 2012-09-30 DIAGNOSIS — R0602 Shortness of breath: Secondary | ICD-10-CM

## 2012-09-30 DIAGNOSIS — R072 Precordial pain: Secondary | ICD-10-CM

## 2012-09-30 DIAGNOSIS — R609 Edema, unspecified: Secondary | ICD-10-CM | POA: Insufficient documentation

## 2012-09-30 LAB — CBC WITH DIFFERENTIAL/PLATELET
Basophils Absolute: 0 10*3/uL (ref 0.0–0.1)
Basophils Relative: 0.6 % (ref 0.0–3.0)
Eosinophils Absolute: 0.5 10*3/uL (ref 0.0–0.7)
Eosinophils Relative: 7.9 % — ABNORMAL HIGH (ref 0.0–5.0)
HCT: 35.2 % — ABNORMAL LOW (ref 36.0–46.0)
Hemoglobin: 11.5 g/dL — ABNORMAL LOW (ref 12.0–15.0)
Lymphocytes Relative: 33.5 % (ref 12.0–46.0)
Lymphs Abs: 2 10*3/uL (ref 0.7–4.0)
MCHC: 32.6 g/dL (ref 30.0–36.0)
MCV: 85.4 fl (ref 78.0–100.0)
Monocytes Absolute: 0.6 10*3/uL (ref 0.1–1.0)
Monocytes Relative: 9.8 % (ref 3.0–12.0)
Neutro Abs: 2.8 10*3/uL (ref 1.4–7.7)
Neutrophils Relative %: 48.2 % (ref 43.0–77.0)
Platelets: 187 10*3/uL (ref 150.0–400.0)
RBC: 4.12 Mil/uL (ref 3.87–5.11)
RDW: 13.8 % (ref 11.5–14.6)
WBC: 5.9 10*3/uL (ref 4.5–10.5)

## 2012-09-30 LAB — BASIC METABOLIC PANEL
BUN: 13 mg/dL (ref 6–23)
CO2: 24 mEq/L (ref 19–32)
Calcium: 8.7 mg/dL (ref 8.4–10.5)
Chloride: 109 mEq/L (ref 96–112)
Creatinine, Ser: 0.9 mg/dL (ref 0.4–1.2)
GFR: 89.14 mL/min (ref >60.00)
Glucose, Bld: 67 mg/dL — ABNORMAL LOW (ref 70–99)
Potassium: 3.9 mEq/L (ref 3.5–5.1)
Sodium: 137 mEq/L (ref 135–145)

## 2012-09-30 LAB — LIPID PANEL
Cholesterol: 166 mg/dL (ref 0–200)
HDL: 51.3 mg/dL (ref >39.00)
LDL Cholesterol: 104 mg/dL — ABNORMAL HIGH (ref 0–99)
Total CHOL/HDL Ratio: 3
Triglycerides: 56 mg/dL (ref 0.0–149.0)
VLDL: 11.2 mg/dL (ref 0.0–40.0)

## 2012-09-30 LAB — BRAIN NATRIURETIC PEPTIDE: Pro B Natriuretic peptide (BNP): 10 pg/mL (ref 0.0–100.0)

## 2012-09-30 NOTE — Progress Notes (Signed)
Echocardiogram performed.  

## 2012-10-08 ENCOUNTER — Other Ambulatory Visit: Payer: Self-pay | Admitting: Gastroenterology

## 2012-10-08 DIAGNOSIS — R112 Nausea with vomiting, unspecified: Secondary | ICD-10-CM

## 2012-10-08 DIAGNOSIS — R079 Chest pain, unspecified: Secondary | ICD-10-CM

## 2012-10-15 ENCOUNTER — Other Ambulatory Visit: Payer: Self-pay | Admitting: Neurology

## 2012-10-23 ENCOUNTER — Other Ambulatory Visit (HOSPITAL_COMMUNITY): Payer: Medicare Other

## 2012-11-01 ENCOUNTER — Ambulatory Visit (HOSPITAL_COMMUNITY)
Admission: RE | Admit: 2012-11-01 | Discharge: 2012-11-01 | Disposition: A | Discharge status: Home / Self Care | Payer: Medicare Other | Source: Ambulatory Visit | Attending: Gastroenterology | Admitting: Gastroenterology

## 2012-11-01 ENCOUNTER — Encounter (HOSPITAL_COMMUNITY)
Admission: RE | Admit: 2012-11-01 | Discharge: 2012-11-01 | Disposition: A | Discharge status: Home / Self Care | Payer: Medicare Other | Source: Ambulatory Visit | Attending: Gastroenterology | Admitting: Gastroenterology

## 2012-11-01 DIAGNOSIS — R112 Nausea with vomiting, unspecified: Secondary | ICD-10-CM | POA: Insufficient documentation

## 2012-11-01 DIAGNOSIS — R079 Chest pain, unspecified: Secondary | ICD-10-CM | POA: Insufficient documentation

## 2012-11-01 DIAGNOSIS — I10 Essential (primary) hypertension: Secondary | ICD-10-CM | POA: Insufficient documentation

## 2012-11-01 MED ORDER — SINCALIDE 5 MCG IJ SOLR
0.0200 ug/kg | Freq: Once | INTRAMUSCULAR | Status: AC
  Administered 2012-11-01: 2.24 ug via INTRAVENOUS

## 2012-11-01 MED ORDER — TECHNETIUM TC 99M MEBROFENIN IV KIT
5.0000 | PACK | Freq: Once | INTRAVENOUS | Status: AC | PRN
  Administered 2012-11-01: 5 via INTRAVENOUS

## 2012-11-01 MED ORDER — SINCALIDE 5 MCG IJ SOLR
INTRAMUSCULAR | Status: AC
  Filled 2012-11-01: qty 10

## 2012-12-16 ENCOUNTER — Encounter (INDEPENDENT_AMBULATORY_CARE_PROVIDER_SITE_OTHER): Payer: Self-pay | Admitting: Surgery

## 2012-12-30 ENCOUNTER — Encounter (INDEPENDENT_AMBULATORY_CARE_PROVIDER_SITE_OTHER): Payer: Self-pay | Admitting: General Surgery

## 2012-12-30 ENCOUNTER — Encounter (INDEPENDENT_AMBULATORY_CARE_PROVIDER_SITE_OTHER): Payer: Self-pay | Admitting: Surgery

## 2012-12-30 ENCOUNTER — Ambulatory Visit (INDEPENDENT_AMBULATORY_CARE_PROVIDER_SITE_OTHER): Payer: Medicare Other | Admitting: Surgery

## 2012-12-30 VITALS — BP 130/70 | HR 72 | Temp 98.2°F | Resp 16 | Ht 65.5 in | Wt 242.6 lb

## 2012-12-30 DIAGNOSIS — K828 Other specified diseases of gallbladder: Secondary | ICD-10-CM | POA: Insufficient documentation

## 2012-12-30 NOTE — Progress Notes (Signed)
Patient ID: [name], female   DOB: 01/17/1966, 47 y.o.   MRN: [id number]  Chief Complaint  Patient presents with  . New Evaluation    eval gb    HPI [name] is a 47 y.o. female.   HPI This is a pleasant female referred by Dr. Arty Baumgartner for evaluation of abdominal pain, nausea, vomiting, and biliary dyskinesia. She has had extensive workup including upper endoscopy, ultrasound, HIDA scan, and even a recent cardiac catheterization because of her cardiac history in the past.  She will have right upper quadrant and epigastric abdominal pain after fatty meals with nausea and occasional vomiting as well as pain into her chest. This has been going on for many years but is now becoming a daily event. The pain is cramping and moderate in intensity. She also has chronic constipation Past Medical History  Diagnosis Date  . HTN (hypertension)   . Seizure disorder     since childhood.  . Chest pain 05/15/11, 05/16/11  . Migraine   . Asthma   . Hypothyroid   . GERD (gastroesophageal reflux disease)   . Obesity   . MI (myocardial infarction)     Past Surgical History  Procedure Laterality Date  . Cardiac catheterization      [facility]   . Cesarean section      placement of vagal nerve stimulator.  . Implantation vagal nerve stimulator      Family History  Problem Relation Age of Onset  . Coronary artery disease Mother 16  . Coronary artery disease Maternal Grandmother   . Cancer Other   . Hypertension Other   . Stroke Other     Social History History  Substance Use Topics  . Smoking status: Never Smoker   . Smokeless tobacco: Never Used  . Alcohol Use: 0.0 oz/week    1-3 Glasses of wine per week     Comment: occasional    Allergies  Allergen Reactions  . Aspirin     Irritates stomach  . Amantadines     swelling  . Amitriptyline Swelling  . Amlodipine Swelling  . Latex Itching and Swelling    Whelps, burning  . Simvastatin     Current  Outpatient Prescriptions  Medication Sig Dispense Refill  . budesonide-formoterol (SYMBICORT) 160-4.5 MCG/ACT inhaler Inhale 2 puffs into the lungs 2 (two) times daily.      . Coenzyme Q10 200 MG TABS Take 1 tablet by mouth every evening. Hold while in hospital    0  . furosemide (LASIX) 20 MG tablet Take 20 mg by mouth daily. TAKE 40 MG X 3 DAYS THEN STOP; MAY TAKE 20 MG PRN FOR EDEMA      . isosorbide mononitrate (IMDUR) 60 MG 24 hr tablet TAKE 1 TABLET (60 MG TOTAL) BY MOUTH 2 (TWO) TIMES DAILY.  60 tablet  6  . levETIRAcetam (KEPPRA) 500 MG tablet TAKE 1 TABLET TWICE A DAY  60 tablet  0  . levothyroxine (SYNTHROID, LEVOTHROID) 50 MCG tablet Take 50 mcg by mouth daily.       . metoprolol tartrate (LOPRESSOR) 25 MG tablet Take 12.5 mg by mouth 2 (two) times daily.       . nitroGLYCERIN (NITROSTAT) 0.4 MG SL tablet Place 0.4 mg under the tongue every 5 (five) minutes as needed. For chest pain      . olmesartan (BENICAR) 20 MG tablet Take 1 tablet (20 mg total) by mouth daily.  30 tablet  6  .  omeprazole (PRILOSEC) 20 MG capsule Take 40 mg by mouth daily.      . pantoprazole (PROTONIX) 40 MG tablet Take 40 mg by mouth daily.      . potassium chloride (K-DUR) 10 MEQ tablet Take 10 mEq by mouth daily. TAKE 20 MEQ x 3 DAYS THEN STOP; MAY TAKE 10 MEQ PRN WITH LASIX      . sulfamethoxazole-trimethoprim (BACTRIM,SEPTRA) 400-80 MG per tablet Take 1 tablet by mouth 2 (two) times daily.      . SUMAtriptan (IMITREX) 100 MG tablet Take 100 mg by mouth every 2 (two) hours as needed. For migraines      . topiramate (TOPAMAX) 50 MG tablet Take 100 mg by mouth 2 (two) times daily.        No current facility-administered medications for this visit.    Review of Systems Review of Systems  Constitutional: Negative for fever, chills and unexpected weight change.  HENT: Negative for hearing loss, congestion, sore throat, trouble swallowing and voice change.   Eyes: Negative for visual disturbance.   Respiratory: Positive for shortness of breath. Negative for cough and wheezing.   Cardiovascular: Positive for chest pain and leg swelling. Negative for palpitations.  Gastrointestinal: Positive for nausea, vomiting, abdominal pain, constipation and abdominal distention. Negative for diarrhea, blood in stool and anal bleeding.  Genitourinary: Negative for hematuria, vaginal bleeding and difficulty urinating.  Musculoskeletal: Negative for arthralgias.  Skin: Negative for rash and wound.  Neurological: Negative for seizures, syncope and headaches.  Hematological: Negative for adenopathy. Does not bruise/bleed easily.  Psychiatric/Behavioral: Negative for confusion.    Blood pressure 130/70, pulse 72, temperature 98.2 F (36.8 C), temperature source Temporal, resp. rate 16, height 5' 5.5" (1.664 m), weight 242 lb 9.6 oz (110.043 kg).  Physical Exam Physical Exam  Constitutional: She is oriented to person, place, and time. She appears well-developed. No distress.  obese  HENT:  Head: Normocephalic and atraumatic.  Right Ear: External ear normal.  Left Ear: External ear normal.  Nose: Nose normal.  Mouth/Throat: Oropharynx is clear and moist. No oropharyngeal exudate.  Eyes: Conjunctivae are normal. Pupils are equal, round, and reactive to light. Right eye exhibits no discharge. Left eye exhibits no discharge. No scleral icterus.  Neck: Normal range of motion. Neck supple. No tracheal deviation present. No thyromegaly present.  Cardiovascular: Normal rate, regular rhythm, normal heart sounds and intact distal pulses.   No murmur heard. Pulmonary/Chest: Effort normal and breath sounds normal. No respiratory distress. She has no wheezes.  Abdominal: Soft. Bowel sounds are normal. There is tenderness. There is guarding.  There is mild tenderness with guarding in the right upper quadrant  Musculoskeletal: Normal range of motion. She exhibits no edema and no tenderness.  Lymphadenopathy:     She has no cervical adenopathy.  Neurological: She is alert and oriented to person, place, and time.  Skin: Skin is warm and dry. No rash noted. She is not diaphoretic. No erythema.  Psychiatric: Her behavior is normal. Judgment normal.    Data Reviewed I have reviewed all her meds including her HIDA scan demonstrating a 9% gallbladder ejection fraction  Assessment    Biliary dyskinesia     Plan    I am recommending laparoscopic cholecystectomy. I've discussed this with her in detail.  I discussed the risks of surgery which includes but is not limited to bleeding, infection,  Injury to other structures, bile leak, bile duct injury, need to convert to an open procedure, et Karie Soda. She understands  and wishes to proceed. We will obtain cardiac clearance from her cardiologist preoperatively. Surgery will be scheduled        Segundo Makela A 12/30/2012, 11:31 AM

## 2012-12-31 NOTE — Progress Notes (Signed)
Please have her see PA for risk assessment prior to surgery.  Probably needs no further workup given extensive past workup.

## 2013-01-01 NOTE — Progress Notes (Signed)
Patient scheduled to see [facility] 01/02/13 @ 9:15. Patient is aware.

## 2013-01-02 ENCOUNTER — Telehealth: Payer: Self-pay | Admitting: *Deleted

## 2013-01-02 ENCOUNTER — Encounter: Payer: Self-pay | Admitting: Physician Assistant

## 2013-01-02 ENCOUNTER — Ambulatory Visit (INDEPENDENT_AMBULATORY_CARE_PROVIDER_SITE_OTHER): Payer: Medicare Other | Admitting: Physician Assistant

## 2013-01-02 VITALS — BP 131/85 | HR 74 | Ht 65.5 in | Wt 243.0 lb

## 2013-01-02 DIAGNOSIS — R0989 Other specified symptoms and signs involving the circulatory and respiratory systems: Secondary | ICD-10-CM

## 2013-01-02 DIAGNOSIS — Z01810 Encounter for preprocedural cardiovascular examination: Secondary | ICD-10-CM

## 2013-01-02 DIAGNOSIS — R0600 Dyspnea, unspecified: Secondary | ICD-10-CM

## 2013-01-02 DIAGNOSIS — I10 Essential (primary) hypertension: Secondary | ICD-10-CM

## 2013-01-02 DIAGNOSIS — R0609 Other forms of dyspnea: Secondary | ICD-10-CM

## 2013-01-02 DIAGNOSIS — R079 Chest pain, unspecified: Secondary | ICD-10-CM

## 2013-01-02 NOTE — Progress Notes (Addendum)
[address] [place], [place]  [address] Phone: [phone] Fax:  [phone]  Date:  01/02/2013   ID:  Abigail, Richardson 1966-02-13, MRN [id number]  PCP:  Genia Hotter, MD Lacy Duverney); Dr. Parke Simmers [facility]) Cardiologist:  Dr. Marca Ancona     History of Present Illness: [name] is a 47 y.o. female who returns for surgical clearance.   She has a hx of chest pain and prior negative caths as well as migraines, asthma, and HTN. She has had chest pain chronically over a number of years. She had an abnormal stress test in [place] in 2008. She was then sent for a cardiac cath, which she says was normal. We had her do an ETT-myoview in this office in 11/12. This showed ischemic-type ST depression on exercise ECG, but there was no evidence for ischemia on myoview images. EF was 66%. Given ongoing chest pain and ischemic ECG changes with normal perfusion images (equivocal study), I had her get a coronary CT angiogram. This was a difficult study because we were not able to get her HR as low as would be ideal. However, she had no coronary calcium and no definite stenosis was seen (though mid-RCA was not fully evaluated due to artifact). Dr. Shirlee Latch felt that this was a negative study. She continued to have chest tightness with walking long distances, climbing a flight of steps, and housework. In 5/13, she went to the ER in [place] with particularly bad exertional chest pain that lasted a prolonged time. Troponin was mildly elevated with normal CKMB. She had a left heart cath in the hospital in [place] which showed no angiographic CAD. She was sent home on Imdur and amlodipine. Echo showed normal EF. 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 She stopped the amlodipine due to ankle swelling.   In 12/13, she had her vagal nerve stimulator replaced at [facility] [place]. This was complicated by a surgical site MRSA  infection, and she had a PICC line placed for antibiotics. Ranolazine was stopped at [facility] while she was getting antibiotics. She did not notice much change in her chest pain pattern off ranolazine. She had significant right arm pain since the PICC was removed and had an [place] that did not show a DVT.   Patient has stopped her statin due to muscle pain. Her CPK level has been mildly elevated.   Last seen by Dr. Shirlee Latch in 09/18/12. Patient's chest pain was fairly stable. Patient is more concerned about swelling in her fingers and ankles and exertional dyspnea. It had worsened over the prior few months. BNP was normal. Her weight was up about 5 pounds. Lasix was providing some relief. She was continued on just metoprolol and isosorbide. Echocardiogram was obtained and it was decided to set the patient up for a right heart catheterization. Patient eventually canceled this due to cost.  Echo 09/30/12: Mild LVH, EF 55-65%, and GR 2 DD, trivial AI.  She recently saw Dr. Magnus Ivan for biliary dyskinesia.  She needs laparoscopic cholecystectomy.  She tells me today that she has been having severe CP over the last 2 mos.  This may occur 1-2 times a week.  Not really assoc with meals.  Although, she does have nausea with greasy meals.  She can get CP with exertion.  She notes it with minimal activity (CCS Class III).  She notes radiation to her jaw.  She has assoc dyspnea, nausea, vomiting  and diaphoresis.  Symptoms are like prior MI.  She does note pain with lying flat.  She has to sleep in a recliner some nights.  No significant edema.  No syncope.   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 Chest pain: Multiple episodes over the years. She had an abnormal stress test in 2008 in [place] and it sounds like she went to [facility] for a cardiac catheterization which per her report was normal. Stress echo at [facility] in 2010 was submaximal but showed no evidence for ischemia. ETT-myoview (11/12): 7'15", EF 66%, significant ST depression on exercise ECG, apical thinning on myoview images but no evidence for ischemia. Coronary CT angiogram (11/12): calcium score 0, difficult study with gating artifact, mid RCA nonevaluatable but no stenosis elsewhere in the coronaries. Echo (11/12): EF 55-60%, mild LV hypertrophy, mild MR. NSTEMI in [place] in 5/13 with elevated troponin but LHC showed normal coronaries. Echo (5/13) with EF 60-65%, normal valve, no LVH. 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 Echo 09/30/12: Mild LVH, EF 55-65%, and GR 2 DD, trivial AI. 2. Migraines: Uses triptan medications.  3. HTN  4. Seizure disorder: Has vagus nerve stimulator.  5. Hypothyroidism  6. Asthma since childhood   Current Outpatient Prescriptions  Medication Sig Dispense Refill  . budesonide-formoterol (SYMBICORT) 160-4.5 MCG/ACT inhaler Inhale 2 puffs into the lungs 2 (two) times daily.      . Coenzyme Q10 200 MG TABS Take 1 tablet by mouth every evening. Hold while in hospital    0  . furosemide (LASIX) 20 MG tablet Take 20 mg by mouth daily. TAKE 40 MG X 3 DAYS THEN STOP; MAY TAKE 20 MG PRN FOR EDEMA      . isosorbide mononitrate (IMDUR) 60 MG 24 hr tablet TAKE 1 TABLET (60 MG TOTAL) BY MOUTH 2 (TWO) TIMES DAILY.  60 tablet  6  . levETIRAcetam (KEPPRA) 500 MG tablet TAKE 1 TABLET TWICE A DAY  60 tablet  0  . levothyroxine (SYNTHROID, LEVOTHROID) 50 MCG tablet Take 50 mcg by mouth daily.       . metoprolol tartrate (LOPRESSOR) 25  MG tablet Take 12.5 mg by mouth 2 (two) times daily.       . nitroGLYCERIN (NITROSTAT) 0.4 MG SL tablet Place 0.4 mg under the tongue every 5 (five) minutes as needed. For chest pain      . olmesartan (BENICAR) 20 MG tablet Take 1 tablet (20 mg total) by mouth daily.  30 tablet  6  . omeprazole (PRILOSEC) 20 MG capsule Take 40 mg by mouth daily.      . pantoprazole (PROTONIX) 40 MG tablet Take 40 mg by mouth daily.      . potassium chloride (K-DUR) 10 MEQ tablet Take 10 mEq by mouth daily. TAKE 20 MEQ x 3 DAYS THEN STOP; MAY TAKE 10 MEQ PRN WITH LASIX      .  sulfamethoxazole-trimethoprim (BACTRIM DS,SEPTRA DS) 800-160 MG per tablet Take 1 tablet by mouth 2 (two) times daily.      . SUMAtriptan (IMITREX) 100 MG tablet Take 100 mg by mouth every 2 (two) hours as needed. For migraines      . topiramate (TOPAMAX) 50 MG tablet Take 100 mg by mouth 2 (two) times daily.        No current facility-administered medications for this visit.    Allergies:    Allergies  Allergen Reactions  . Aspirin     Irritates stomach  . Amantadines     swelling  . Amitriptyline Swelling  . Amlodipine Swelling  . Latex Itching and Swelling    Whelps, burning  . Simvastatin     Social History:  The patient  reports that she has never smoked. She has never used smokeless tobacco. She reports that  drinks alcohol. She reports that she does not use illicit drugs.   ROS:  Please see the history of present illness.    All other systems reviewed and negative.   PHYSICAL EXAM: VS:  BP 131/85  Pulse 74  Ht 5' 5.5" (1.664 m)  Wt 243 lb (110.224 kg)  BMI 39.81 kg/m2 Well nourished, well developed, in no acute distress HEENT: normal Neck: no JVD Cardiac:  normal S1, S2; RRR; no murmur; no rub Lungs:  clear to auscultation bilaterally, no wheezing, rhonchi or rales Abd: soft, mild tenderness over epigastrium, no hepatomegaly Ext: no edema Skin: warm and dry Neuro:  CNs 2-12 intact, no focal abnormalities  noted  EKG:  NSR, HR 74, normal axis, NSSTTW changes, no change from prior tracing     ASSESSMENT AND PLAN:  1. 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  She had not significant response to Ranolazine.  She has been maintained on Imdur and Metoprolol.  Last LHC in 10/2011 in [place] was normal.  I reviewed her case with Dr. Marca Ancona.  As she needs clearance for surgery and she is having such severe symptoms of CP, will risk stratify with a Lexiscan Myoview.  If low risk, she may proceed with her surgery at acceptable risk.   2. Dyspnea:  Stable.  She has refused RHC in the past due to cost.  Dr. Shirlee Latch recommended this study to get definitive evidence of CHF/volume overload or pulmonary HTN.  She has continued to feel better on low dose Lasix. 3. Hypertension:  Controlled.  Continue current therapy.  4. Biliary Dyskinesia:  Cholecystectomy pending in late August.  Proceed with Myoview as noted above.   5. Disposition:  F/u with Dr. Marca Ancona in 3 mos.   Signed, Tereso Newcomer, PA-C  01/02/2013 10:15 AM    Addendum 01/18/2013 10:53 PM: Eugenie Birks Myoview with normal EF and normal perfusion. According to [facility] and AHA guidelines, the patient requires no further cardiac workup prior to her noncardiac surgery.  The patient should be at acceptable risk.  Our service is available as necessary in the perioperative period.   Signed, Tereso Newcomer, PA-C   01/18/2013 10:54 PM

## 2013-01-02 NOTE — Patient Instructions (Addendum)
PLEASE FOLLOW UP WITH DR. Shirlee Latch IN 3 MONTHS  NO CHANGES WERE MADE TODAY  PER SCOTT WEAVER, PAC D/W DR. Shirlee Latch AGREED TO DO LEXISCAN; PT ADVISED BY PHONE TODAY WITH VERBAL UNDERSTANDING

## 2013-01-02 NOTE — Addendum Note (Signed)
Addended by: Tarri Fuller on: 01/02/2013 02:05 PM   Modules accepted: Orders

## 2013-01-02 NOTE — Telephone Encounter (Signed)
pt notified per Dr. Shirlee Latch and Bing Neighbors. PA need to have Lexiscan before surgery. Advised will have scheduling call her to make appt. Pt gave understanding to Plan of Care. Mailed out [employer] after verbally giving to pt by phone

## 2013-01-03 ENCOUNTER — Encounter (INDEPENDENT_AMBULATORY_CARE_PROVIDER_SITE_OTHER): Payer: Self-pay

## 2013-01-09 ENCOUNTER — Ambulatory Visit (HOSPITAL_COMMUNITY): Payer: Medicare Other | Attending: Physician Assistant | Admitting: Radiology

## 2013-01-09 VITALS — BP 128/67 | HR 90 | Ht 65.0 in | Wt 243.0 lb

## 2013-01-09 DIAGNOSIS — R110 Nausea: Secondary | ICD-10-CM | POA: Insufficient documentation

## 2013-01-09 DIAGNOSIS — R61 Generalized hyperhidrosis: Secondary | ICD-10-CM | POA: Insufficient documentation

## 2013-01-09 DIAGNOSIS — R0989 Other specified symptoms and signs involving the circulatory and respiratory systems: Secondary | ICD-10-CM | POA: Insufficient documentation

## 2013-01-09 DIAGNOSIS — R0602 Shortness of breath: Secondary | ICD-10-CM | POA: Insufficient documentation

## 2013-01-09 DIAGNOSIS — R0600 Dyspnea, unspecified: Secondary | ICD-10-CM

## 2013-01-09 DIAGNOSIS — I4949 Other premature depolarization: Secondary | ICD-10-CM

## 2013-01-09 DIAGNOSIS — R079 Chest pain, unspecified: Secondary | ICD-10-CM

## 2013-01-09 DIAGNOSIS — I10 Essential (primary) hypertension: Secondary | ICD-10-CM | POA: Insufficient documentation

## 2013-01-09 DIAGNOSIS — R5383 Other fatigue: Secondary | ICD-10-CM | POA: Insufficient documentation

## 2013-01-09 DIAGNOSIS — M25519 Pain in unspecified shoulder: Secondary | ICD-10-CM | POA: Insufficient documentation

## 2013-01-09 DIAGNOSIS — R002 Palpitations: Secondary | ICD-10-CM | POA: Insufficient documentation

## 2013-01-09 DIAGNOSIS — R0789 Other chest pain: Secondary | ICD-10-CM | POA: Insufficient documentation

## 2013-01-09 DIAGNOSIS — R55 Syncope and collapse: Secondary | ICD-10-CM | POA: Insufficient documentation

## 2013-01-09 DIAGNOSIS — R42 Dizziness and giddiness: Secondary | ICD-10-CM | POA: Insufficient documentation

## 2013-01-09 DIAGNOSIS — R6884 Jaw pain: Secondary | ICD-10-CM | POA: Insufficient documentation

## 2013-01-09 DIAGNOSIS — Z8249 Family history of ischemic heart disease and other diseases of the circulatory system: Secondary | ICD-10-CM | POA: Insufficient documentation

## 2013-01-09 DIAGNOSIS — Z01810 Encounter for preprocedural cardiovascular examination: Secondary | ICD-10-CM | POA: Insufficient documentation

## 2013-01-09 DIAGNOSIS — R5381 Other malaise: Secondary | ICD-10-CM | POA: Insufficient documentation

## 2013-01-09 DIAGNOSIS — R0609 Other forms of dyspnea: Secondary | ICD-10-CM | POA: Insufficient documentation

## 2013-01-09 DIAGNOSIS — E785 Hyperlipidemia, unspecified: Secondary | ICD-10-CM | POA: Insufficient documentation

## 2013-01-09 MED ORDER — TECHNETIUM TC 99M SESTAMIBI GENERIC - CARDIOLITE
33.0000 | Freq: Once | INTRAVENOUS | Status: AC | PRN
  Administered 2013-01-09: 33 via INTRAVENOUS

## 2013-01-09 MED ORDER — REGADENOSON 0.4 MG/5ML IV SOLN
0.4000 mg | Freq: Once | INTRAVENOUS | Status: AC
  Administered 2013-01-09: 0.4 mg via INTRAVENOUS

## 2013-01-09 MED ORDER — AMINOPHYLLINE 25 MG/ML IV SOLN
150.0000 mg | Freq: Once | INTRAVENOUS | Status: AC
  Administered 2013-01-09: 150 mg via INTRAVENOUS

## 2013-01-09 NOTE — Progress Notes (Signed)
[facility] SITE 3 NUCLEAR MED [address] [place], [place] [address] [phone]    Cardiology Nuclear Med Study  Abigail GOODELL is a 47 y.o. female     MRN : [id number]     DOB: 12-09-1965  Procedure Date: 01/09/2013  Nuclear Med Background Indication for Stress Test:  Evaluation for Ischemia and Pending Clearance for Cholecystectomy by Dr. Loretha Brasil on 01/24/13 History:  '12 MPS:no ischemia, EF=66%; h/o Multiple Caths; LHC 5/13:normal coronaries; 09/30/12 Echo:EF=65% Cardiac Risk Factors: Family History - CAD, Hypertension and Lipids  Symptoms:  Chest Pressure/Tightness>Jaw and (R) Shoulder with and without Exertion (last episode of chest discomfort was last week), Diaphoresis, Dizziness, DOE/SOB, Fatigue, Nausea, Near Syncope and Palpitations   Nuclear Pre-Procedure Caffeine/Decaff Intake:  None NPO After: 9:00am   Lungs:  Clear. O2 Sat: 98% on room air. IV 0.9% NS with Angio Cath:  22g  IV Site: R Antecubital  IV Started by:  Bonnita Levan, RN  Chest Size (in):  46 Cup Size: K  Height: 5\' 5"  (1.651 m)  Weight:  243 lb (110.224 kg)  BMI:  Body mass index is 40.44 kg/(m^2). Tech Comments:  N/A    Nuclear Med Study 1 or 2 day study: 2 day  Stress Test Type:  Lexiscan  Reading MD: Charlton Haws, MD  Order Authorizing Provider:  Marca Ancona, MD  Resting Radionuclide: Technetium 30m Sestamibi  Resting Radionuclide Dose: 33.0 mCi on 01/15/13   Stress Radionuclide:  Technetium 34m Sestamibi  Stress Radionuclide Dose: 33.0 mCi on 01/09/13           Stress Protocol Rest HR: 90 Stress HR: 131  Rest BP: 128/67 Stress BP: 139/69  Exercise Time (min): 2:00 METS: n/a   Predicted Max HR: 172 bpm % Max HR: 76.16 bpm Rate Pressure Product: 21308   Dose of Adenosine (mg):  n/a Dose of Lexiscan: 0.4 mg Dose of Aminophylline:150 mg  Dose of Atropine (mg): n/a Dose of Dobutamine: n/a mcg/kg/min (at max HR)  Stress Test Technologist: Smiley Houseman, CMA-N  Nuclear  Technologist:  Doyne Keel, CNMT     Rest Procedure:  Myocardial perfusion imaging was performed at rest 45 minutes following the intravenous administration of Technetium 9m Sestamibi.  Rest ECG: NSR-LVH  Stress Procedure:  The patient received IV Lexiscan 0.4 mg over 15-seconds.  Technetium 63m Sestamibi injected at 30-seconds.  She c/o chest pressure, nausea, shortness of breath and lightheadedness with Lexiscan; that was relieved with Aminophylline 150 mg IV.  Quantitative spect images were obtained after a 45 minute delay.  Stress ECG: No significant change from baseline ECG  QPS Raw Data Images:  Normal; no motion artifact; normal heart/lung ratio. Stress Images:  Normal  Rest Images:  Normal homogeneous uptake in all areas of the myocardium. Subtraction (SDS):  Normal Transient Ischemic Dilatation (Normal <1.22):  n/a Lung/Heart Ratio (Normal <0.45):  0.39  Quantitative Gated Spect Images QGS EDV:  92 ml QGS ESV:  24 ml  Impression Exercise Capacity:  Lexiscan with low level exercise. BP Response:  Normal blood pressure response. Clinical Symptoms:  Nausea dyspnea and chest pain Given aminopylline ECG Impression:  No significant ST segment change suggestive of ischemia. Comparison with Prior Nuclear Study: No images to compare  Overall Impression:  Normal stress nuclear study.  LV Ejection Fraction: 74%.  LV Wall Motion:  NL LV Function; NL Wall Motion   Charlton Haws

## 2013-01-14 ENCOUNTER — Encounter (HOSPITAL_COMMUNITY): Payer: Medicare Other

## 2013-01-15 ENCOUNTER — Ambulatory Visit (HOSPITAL_COMMUNITY): Payer: Medicare Other | Attending: Cardiology | Admitting: Radiology

## 2013-01-15 DIAGNOSIS — R0989 Other specified symptoms and signs involving the circulatory and respiratory systems: Secondary | ICD-10-CM

## 2013-01-15 MED ORDER — TECHNETIUM TC 99M SESTAMIBI GENERIC - CARDIOLITE
33.0000 | Freq: Once | INTRAVENOUS | Status: AC | PRN
  Administered 2013-01-15: 33 via INTRAVENOUS

## 2013-01-17 ENCOUNTER — Encounter (HOSPITAL_BASED_OUTPATIENT_CLINIC_OR_DEPARTMENT_OTHER): Payer: Self-pay | Admitting: *Deleted

## 2013-01-17 NOTE — Progress Notes (Signed)
Pt has had CAD-multiple caths-no stents-stress test last week dr Shirlee Latch for clearance for surgery-hx seizures and had vagus nerve stimulator [facility]-got mrsa 12/13 form battery chg-still on antibiotics To come in for bmet-denies sleep apnea but may have it. Reviewed with dr crews-pt ok.

## 2013-01-18 ENCOUNTER — Encounter: Payer: Self-pay | Admitting: Physician Assistant

## 2013-01-20 ENCOUNTER — Telehealth: Payer: Self-pay | Admitting: *Deleted

## 2013-01-20 NOTE — Telephone Encounter (Signed)
pt notified about normal myoview and cleared for surgery, will fax myoview and PA ov note to Dr. Rayburn Ma today

## 2013-01-20 NOTE — Telephone Encounter (Signed)
Message copied by Tarri Fuller on Mon Jan 20, 2013 12:04 PM ------      Message from: Wiley Ford, [place] T      Created: Sat Jan 18, 2013 10:52 PM       Please inform patient stress test normal.      She may proceed with surgery at acceptable risk      Please send stress test to surgeon along with my addended note.      Tereso Newcomer, PA-C  10:52 PM 01/18/2013 ------

## 2013-01-23 ENCOUNTER — Encounter (HOSPITAL_BASED_OUTPATIENT_CLINIC_OR_DEPARTMENT_OTHER)
Admission: RE | Admit: 2013-01-23 | Discharge: 2013-01-23 | Disposition: A | Discharge status: Home / Self Care | Payer: Medicare Other | Source: Ambulatory Visit | Attending: Surgery | Admitting: Surgery

## 2013-01-23 LAB — BASIC METABOLIC PANEL
BUN: 8 mg/dL (ref 6–23)
CO2: 24 mEq/L (ref 19–32)
Calcium: 8.5 mg/dL (ref 8.4–10.5)
Chloride: 106 mEq/L (ref 96–112)
Creatinine, Ser: 0.65 mg/dL (ref 0.50–1.10)
GFR calc Af Amer: >90 mL/min (ref >90)
GFR calc non Af Amer: >90 mL/min (ref >90)
Glucose, Bld: 89 mg/dL (ref 70–99)
Potassium: 4 mEq/L (ref 3.5–5.1)
Sodium: 137 mEq/L (ref 135–145)

## 2013-01-23 NOTE — H&P (Signed)
Patient ID: [name], female DOB: 1965/12/12, 47 y.o. MRN: [id number]  Chief Complaint   Patient presents with   .  New Evaluation     eval gb   HPI  [name] is a 47 y.o. female.  HPI  This is a pleasant female referred by Dr. Arty Baumgartner for evaluation of abdominal pain, nausea, vomiting, and biliary dyskinesia. She has had extensive workup including upper endoscopy, ultrasound, HIDA scan, and even a recent cardiac catheterization because of her cardiac history in the past. She will have right upper quadrant and epigastric abdominal pain after fatty meals with nausea and occasional vomiting as well as pain into her chest. This has been going on for many years but is now becoming a daily event. The pain is cramping and moderate in intensity. She also has chronic constipation  Past Medical History   Diagnosis  Date   .  HTN (hypertension)    .  Seizure disorder      since childhood.   .  Chest pain  05/15/11, 05/16/11   .  Migraine    .  Asthma    .  Hypothyroid    .  GERD (gastroesophageal reflux disease)    .  Obesity    .  MI (myocardial infarction)     Past Surgical History   Procedure  Laterality  Date   .  Cardiac catheterization       [facility]   .  Cesarean section       placement of vagal nerve stimulator.   .  Implantation vagal nerve stimulator      Family History   Problem  Relation  Age of Onset   .  Coronary artery disease  Mother  64   .  Coronary artery disease  Maternal Grandmother    .  Cancer  Other    .  Hypertension  Other    .  Stroke  Other    Social History  History   Substance Use Topics   .  Smoking status:  Never Smoker   .  Smokeless tobacco:  Never Used   .  Alcohol Use:  0.0 oz/week     1-3 Glasses of wine per week      Comment: occasional    Allergies   Allergen  Reactions   .  Aspirin      Irritates stomach   .  Amantadines      swelling   .  Amitriptyline  Swelling   .  Amlodipine  Swelling   .  Latex   Itching and Swelling     Whelps, burning   .  Simvastatin     Current Outpatient Prescriptions   Medication  Sig  Dispense  Refill   .  budesonide-formoterol (SYMBICORT) 160-4.5 MCG/ACT inhaler  Inhale 2 puffs into the lungs 2 (two) times daily.     .  Coenzyme Q10 200 MG TABS  Take 1 tablet by mouth every evening. Hold while in hospital   0   .  furosemide (LASIX) 20 MG tablet  Take 20 mg by mouth daily. TAKE 40 MG X 3 DAYS THEN STOP; MAY TAKE 20 MG PRN FOR EDEMA     .  isosorbide mononitrate (IMDUR) 60 MG 24 hr tablet  TAKE 1 TABLET (60 MG TOTAL) BY MOUTH 2 (TWO) TIMES DAILY.  60 tablet  6   .  levETIRAcetam (KEPPRA) 500 MG tablet  TAKE 1 TABLET TWICE  A DAY  60 tablet  0   .  levothyroxine (SYNTHROID, LEVOTHROID) 50 MCG tablet  Take 50 mcg by mouth daily.     .  metoprolol tartrate (LOPRESSOR) 25 MG tablet  Take 12.5 mg by mouth 2 (two) times daily.     .  nitroGLYCERIN (NITROSTAT) 0.4 MG SL tablet  Place 0.4 mg under the tongue every 5 (five) minutes as needed. For chest pain     .  olmesartan (BENICAR) 20 MG tablet  Take 1 tablet (20 mg total) by mouth daily.  30 tablet  6   .  omeprazole (PRILOSEC) 20 MG capsule  Take 40 mg by mouth daily.     .  pantoprazole (PROTONIX) 40 MG tablet  Take 40 mg by mouth daily.     .  potassium chloride (K-DUR) 10 MEQ tablet  Take 10 mEq by mouth daily. TAKE 20 MEQ x 3 DAYS THEN STOP; MAY TAKE 10 MEQ PRN WITH LASIX     .  sulfamethoxazole-trimethoprim (BACTRIM,SEPTRA) 400-80 MG per tablet  Take 1 tablet by mouth 2 (two) times daily.     .  SUMAtriptan (IMITREX) 100 MG tablet  Take 100 mg by mouth every 2 (two) hours as needed. For migraines     .  topiramate (TOPAMAX) 50 MG tablet  Take 100 mg by mouth 2 (two) times daily.      No current facility-administered medications for this visit.   Review of Systems  Review of Systems  Constitutional: Negative for fever, chills and unexpected weight change.  HENT: Negative for hearing loss, congestion, sore  throat, trouble swallowing and voice change.  Eyes: Negative for visual disturbance.  Respiratory: Positive for shortness of breath. Negative for cough and wheezing.  Cardiovascular: Positive for chest pain and leg swelling. Negative for palpitations.  Gastrointestinal: Positive for nausea, vomiting, abdominal pain, constipation and abdominal distention. Negative for diarrhea, blood in stool and anal bleeding.  Genitourinary: Negative for hematuria, vaginal bleeding and difficulty urinating.  Musculoskeletal: Negative for arthralgias.  Skin: Negative for rash and wound.  Neurological: Negative for seizures, syncope and headaches.  Hematological: Negative for adenopathy. Does not bruise/bleed easily.  Psychiatric/Behavioral: Negative for confusion.  Blood pressure 130/70, pulse 72, temperature 98.2 F (36.8 C), temperature source Temporal, resp. rate 16, height 5' 5.5" (1.664 m), weight 242 lb 9.6 oz (110.043 kg).  Physical Exam  Physical Exam  Constitutional: She is oriented to person, place, and time. She appears well-developed. No distress.  obese  HENT:  Head: Normocephalic and atraumatic.  Right Ear: External ear normal.  Left Ear: External ear normal.  Nose: Nose normal.  Mouth/Throat: Oropharynx is clear and moist. No oropharyngeal exudate.  Eyes: Conjunctivae are normal. Pupils are equal, round, and reactive to light. Right eye exhibits no discharge. Left eye exhibits no discharge. No scleral icterus.  Neck: Normal range of motion. Neck supple. No tracheal deviation present. No thyromegaly present.  Cardiovascular: Normal rate, regular rhythm, normal heart sounds and intact distal pulses.  No murmur heard.  Pulmonary/Chest: Effort normal and breath sounds normal. No respiratory distress. She has no wheezes.  Abdominal: Soft. Bowel sounds are normal. There is tenderness. There is guarding.  There is mild tenderness with guarding in the right upper quadrant  Musculoskeletal: Normal  range of motion. She exhibits no edema and no tenderness.  Lymphadenopathy:  She has no cervical adenopathy.  Neurological: She is alert and oriented to person, place, and time.  Skin: Skin is warm and  dry. No rash noted. She is not diaphoretic. No erythema.  Psychiatric: Her behavior is normal. Judgment normal.  Data Reviewed  I have reviewed all her meds including her HIDA scan demonstrating a 9% gallbladder ejection fraction  Assessment  Biliary dyskinesia  Plan  I am recommending laparoscopic cholecystectomy. I've discussed this with her in detail. I discussed the risks of surgery which includes but is not limited to bleeding, infection, Injury to other structures, bile leak, bile duct injury, need to convert to an open procedure, et Karie Soda. She understands and wishes to proceed. We will obtain cardiac clearance from her cardiologist preoperatively. Surgery will be scheduled

## 2013-01-24 ENCOUNTER — Encounter (HOSPITAL_BASED_OUTPATIENT_CLINIC_OR_DEPARTMENT_OTHER): Payer: Self-pay | Admitting: *Deleted

## 2013-01-24 ENCOUNTER — Encounter (HOSPITAL_BASED_OUTPATIENT_CLINIC_OR_DEPARTMENT_OTHER)
Admission: RE | Disposition: A | Discharge status: Home / Self Care | Payer: Self-pay | Source: Ambulatory Visit | Attending: Surgery

## 2013-01-24 ENCOUNTER — Encounter (HOSPITAL_BASED_OUTPATIENT_CLINIC_OR_DEPARTMENT_OTHER): Payer: Self-pay | Admitting: Anesthesiology

## 2013-01-24 ENCOUNTER — Ambulatory Visit (HOSPITAL_BASED_OUTPATIENT_CLINIC_OR_DEPARTMENT_OTHER)
Admission: RE | Admit: 2013-01-24 | Discharge: 2013-01-24 | Disposition: A | Discharge status: Home / Self Care | Payer: Medicare Other | Source: Ambulatory Visit | Attending: Surgery | Admitting: Surgery

## 2013-01-24 ENCOUNTER — Telehealth (INDEPENDENT_AMBULATORY_CARE_PROVIDER_SITE_OTHER): Payer: Self-pay | Admitting: General Surgery

## 2013-01-24 ENCOUNTER — Ambulatory Visit (HOSPITAL_BASED_OUTPATIENT_CLINIC_OR_DEPARTMENT_OTHER): Payer: Medicare Other | Admitting: Anesthesiology

## 2013-01-24 DIAGNOSIS — J45909 Unspecified asthma, uncomplicated: Secondary | ICD-10-CM | POA: Insufficient documentation

## 2013-01-24 DIAGNOSIS — K219 Gastro-esophageal reflux disease without esophagitis: Secondary | ICD-10-CM | POA: Insufficient documentation

## 2013-01-24 DIAGNOSIS — R1011 Right upper quadrant pain: Secondary | ICD-10-CM

## 2013-01-24 DIAGNOSIS — E669 Obesity, unspecified: Secondary | ICD-10-CM | POA: Insufficient documentation

## 2013-01-24 DIAGNOSIS — Z886 Allergy status to analgesic agent status: Secondary | ICD-10-CM | POA: Insufficient documentation

## 2013-01-24 DIAGNOSIS — Z91040 Latex allergy status: Secondary | ICD-10-CM | POA: Insufficient documentation

## 2013-01-24 DIAGNOSIS — Z79899 Other long term (current) drug therapy: Secondary | ICD-10-CM | POA: Insufficient documentation

## 2013-01-24 DIAGNOSIS — D134 Benign neoplasm of liver: Secondary | ICD-10-CM

## 2013-01-24 DIAGNOSIS — G40909 Epilepsy, unspecified, not intractable, without status epilepticus: Secondary | ICD-10-CM | POA: Insufficient documentation

## 2013-01-24 DIAGNOSIS — D135 Benign neoplasm of extrahepatic bile ducts: Secondary | ICD-10-CM | POA: Insufficient documentation

## 2013-01-24 DIAGNOSIS — Z888 Allergy status to other drugs, medicaments and biological substances status: Secondary | ICD-10-CM | POA: Insufficient documentation

## 2013-01-24 DIAGNOSIS — I10 Essential (primary) hypertension: Secondary | ICD-10-CM | POA: Insufficient documentation

## 2013-01-24 DIAGNOSIS — E039 Hypothyroidism, unspecified: Secondary | ICD-10-CM | POA: Insufficient documentation

## 2013-01-24 DIAGNOSIS — I252 Old myocardial infarction: Secondary | ICD-10-CM | POA: Insufficient documentation

## 2013-01-24 DIAGNOSIS — G43909 Migraine, unspecified, not intractable, without status migrainosus: Secondary | ICD-10-CM | POA: Insufficient documentation

## 2013-01-24 HISTORY — DX: Adverse effect of unspecified anesthetic, initial encounter: T41.45XA

## 2013-01-24 HISTORY — PX: CHOLECYSTECTOMY: SHX55

## 2013-01-24 HISTORY — DX: Other complications of anesthesia, initial encounter: T88.59XA

## 2013-01-24 LAB — POCT HEMOGLOBIN-HEMACUE: Hemoglobin: 11.8 g/dL — ABNORMAL LOW (ref 12.0–15.0)

## 2013-01-24 SURGERY — LAPAROSCOPIC CHOLECYSTECTOMY
Anesthesia: General | Site: Abdomen | Wound class: Contaminated

## 2013-01-24 MED ORDER — SODIUM CHLORIDE 0.9 % IJ SOLN
3.0000 mL | Freq: Two times a day (BID) | INTRAMUSCULAR | Status: DC
Start: 2013-01-24 — End: 2013-01-24

## 2013-01-24 MED ORDER — ACETAMINOPHEN 325 MG PO TABS: 650.0000 mg | ORAL_TABLET | ORAL | Status: DC | PRN

## 2013-01-24 MED ORDER — ACETAMINOPHEN 650 MG RE SUPP: 650.0000 mg | RECTAL | Status: DC | PRN

## 2013-01-24 MED ORDER — BUPIVACAINE-EPINEPHRINE PF 0.5-1:200000 % IJ SOLN
INTRAMUSCULAR | Status: DC | PRN
  Administered 2013-01-24: 20 mL

## 2013-01-24 MED ORDER — OXYCODONE HCL 5 MG PO TABS
5.0000 mg | ORAL_TABLET | Freq: Once | ORAL | Status: AC | PRN
  Administered 2013-01-24: 5 mg via ORAL

## 2013-01-24 MED ORDER — ONDANSETRON HCL 4 MG/2ML IJ SOLN: 4.0000 mg | Freq: Four times a day (QID) | INTRAMUSCULAR | Status: DC | PRN

## 2013-01-24 MED ORDER — MORPHINE SULFATE 4 MG/ML IJ SOLN: 4.0000 mg | INTRAMUSCULAR | Status: DC | PRN

## 2013-01-24 MED ORDER — SODIUM CHLORIDE 0.9 % IR SOLN
Status: DC | PRN
  Administered 2013-01-24: 1000 mL

## 2013-01-24 MED ORDER — HYDROMORPHONE HCL PF 1 MG/ML IJ SOLN
0.5000 mg | INTRAMUSCULAR | Status: DC | PRN
  Administered 2013-01-24: 0.5 mg via INTRAVENOUS

## 2013-01-24 MED ORDER — SODIUM CHLORIDE 0.9 % IV SOLN: 250.0000 mL | INTRAVENOUS | Status: DC | PRN

## 2013-01-24 MED ORDER — HYDROCODONE-ACETAMINOPHEN 5-325 MG PO TABS: 1.0000 | ORAL_TABLET | ORAL | Status: DC | PRN

## 2013-01-24 MED ORDER — OXYCODONE HCL 5 MG PO TABS: 5.0000 mg | ORAL_TABLET | ORAL | Status: DC | PRN

## 2013-01-24 MED ORDER — MIDAZOLAM HCL 2 MG/2ML IJ SOLN: 1.0000 mg | INTRAMUSCULAR | Status: DC | PRN

## 2013-01-24 MED ORDER — MIDAZOLAM HCL 5 MG/5ML IJ SOLN
INTRAMUSCULAR | Status: DC | PRN
  Administered 2013-01-24: 2 mg via INTRAVENOUS

## 2013-01-24 MED ORDER — SUCCINYLCHOLINE CHLORIDE 20 MG/ML IJ SOLN
INTRAMUSCULAR | Status: DC | PRN
  Administered 2013-01-24: 100 mg via INTRAVENOUS

## 2013-01-24 MED ORDER — OXYCODONE HCL 5 MG/5ML PO SOLN: 5.0000 mg | Freq: Once | ORAL | Status: AC | PRN

## 2013-01-24 MED ORDER — PROMETHAZINE HCL 25 MG/ML IJ SOLN: 6.2500 mg | INTRAMUSCULAR | Status: DC | PRN

## 2013-01-24 MED ORDER — ROCURONIUM BROMIDE 100 MG/10ML IV SOLN
INTRAVENOUS | Status: DC | PRN
  Administered 2013-01-24: 10 mg via INTRAVENOUS

## 2013-01-24 MED ORDER — ONDANSETRON HCL 4 MG/2ML IJ SOLN
INTRAMUSCULAR | Status: DC | PRN
  Administered 2013-01-24: 4 mg via INTRAVENOUS

## 2013-01-24 MED ORDER — DEXAMETHASONE SODIUM PHOSPHATE 4 MG/ML IJ SOLN
INTRAMUSCULAR | Status: DC | PRN
  Administered 2013-01-24: 10 mg via INTRAVENOUS

## 2013-01-24 MED ORDER — CEFAZOLIN SODIUM-DEXTROSE 2-3 GM-% IV SOLR
2.0000 g | INTRAVENOUS | Status: AC
  Administered 2013-01-24: 2 g via INTRAVENOUS

## 2013-01-24 MED ORDER — HYDROMORPHONE HCL PF 1 MG/ML IJ SOLN
0.2500 mg | INTRAMUSCULAR | Status: DC | PRN
  Administered 2013-01-24 (×5): 0.5 mg via INTRAVENOUS

## 2013-01-24 MED ORDER — PROPOFOL 10 MG/ML IV BOLUS
INTRAVENOUS | Status: DC | PRN
  Administered 2013-01-24: 20 mg via INTRAVENOUS
  Administered 2013-01-24: 200 mg via INTRAVENOUS

## 2013-01-24 MED ORDER — FENTANYL CITRATE 0.05 MG/ML IJ SOLN: 50.0000 ug | INTRAMUSCULAR | Status: DC | PRN

## 2013-01-24 MED ORDER — FENTANYL CITRATE 0.05 MG/ML IJ SOLN
INTRAMUSCULAR | Status: DC | PRN
  Administered 2013-01-24 (×2): 100 ug via INTRAVENOUS

## 2013-01-24 MED ORDER — LACTATED RINGERS IV SOLN
INTRAVENOUS | Status: DC
  Administered 2013-01-24 (×2): via INTRAVENOUS

## 2013-01-24 MED ORDER — NEOSTIGMINE METHYLSULFATE 1 MG/ML IJ SOLN
INTRAMUSCULAR | Status: DC | PRN
  Administered 2013-01-24: 3 mg via INTRAVENOUS

## 2013-01-24 MED ORDER — GLYCOPYRROLATE 0.2 MG/ML IJ SOLN
INTRAMUSCULAR | Status: DC | PRN
  Administered 2013-01-24: 0.4 mg via INTRAVENOUS

## 2013-01-24 MED ORDER — SODIUM CHLORIDE 0.9 % IJ SOLN: 3.0000 mL | INTRAMUSCULAR | Status: DC | PRN

## 2013-01-24 SURGICAL SUPPLY — 41 items
APPLIER CLIP 5 13 M/L LIGAMAX5 (MISCELLANEOUS) ×2
BANDAGE ADHESIVE 1X3 (GAUZE/BANDAGES/DRESSINGS) ×8 IMPLANT
BENZOIN TINCTURE PRP APPL 2/3 (GAUZE/BANDAGES/DRESSINGS) ×2 IMPLANT
BLADE SURG ROTATE 9660 (MISCELLANEOUS) IMPLANT
CANISTER SUCTION 2500CC (MISCELLANEOUS) ×2 IMPLANT
CHLORAPREP W/TINT 26ML (MISCELLANEOUS) ×2 IMPLANT
CLIP APPLIE 5 13 M/L LIGAMAX5 (MISCELLANEOUS) ×1 IMPLANT
CLOTH BEACON ORANGE TIMEOUT ST (SAFETY) ×2 IMPLANT
COVER MAYO STAND STRL (DRAPES) IMPLANT
DECANTER SPIKE VIAL GLASS SM (MISCELLANEOUS) IMPLANT
DRAPE C-ARM 42X72 X-RAY (DRAPES) IMPLANT
DRAPE UTILITY XL STRL (DRAPES) ×2 IMPLANT
ELECT REM PT RETURN 9FT ADLT (ELECTROSURGICAL) ×2
ELECTRODE REM PT RTRN 9FT ADLT (ELECTROSURGICAL) ×1 IMPLANT
FILTER SMOKE EVAC LAPAROSHD (FILTER) ×2 IMPLANT
GLOVE BIOGEL PI IND STRL 6.5 (GLOVE) ×2 IMPLANT
GLOVE BIOGEL PI IND STRL 7.0 (GLOVE) ×4 IMPLANT
GLOVE BIOGEL PI INDICATOR 6.5 (GLOVE) ×2
GLOVE BIOGEL PI INDICATOR 7.0 (GLOVE) ×4
GLOVE SKINSENSE NS SZ7.5 (GLOVE) ×1
GLOVE SKINSENSE STRL SZ7.5 (GLOVE) ×1 IMPLANT
GLOVE SURG SIGNA 7.5 PF LTX (GLOVE) IMPLANT
GLOVE SURG SS PI 7.0 STRL IVOR (GLOVE) ×4 IMPLANT
GOWN PREVENTION PLUS XLARGE (GOWN DISPOSABLE) ×10 IMPLANT
GOWN STRL NON-REIN LRG LVL3 (GOWN DISPOSABLE) IMPLANT
NS IRRIG 1000ML POUR BTL (IV SOLUTION) IMPLANT
PACK BASIN DAY SURGERY FS (CUSTOM PROCEDURE TRAY) ×2 IMPLANT
POUCH SPECIMEN RETRIEVAL 10MM (ENDOMECHANICALS) ×2 IMPLANT
SCISSORS LAP 5X35 DISP (ENDOMECHANICALS) IMPLANT
SET CHOLANGIOGRAPH 5 50 .035 (SET/KITS/TRAYS/PACK) IMPLANT
SET IRRIG TUBING LAPAROSCOPIC (IRRIGATION / IRRIGATOR) ×2 IMPLANT
SLEEVE ENDOPATH XCEL 5M (ENDOMECHANICALS) ×4 IMPLANT
SLEEVE SCD COMPRESS KNEE MED (MISCELLANEOUS) ×2 IMPLANT
SPECIMEN JAR SMALL (MISCELLANEOUS) ×2 IMPLANT
SUT MON AB 4-0 PC3 18 (SUTURE) ×2 IMPLANT
TOWEL OR 17X24 6PK STRL BLUE (TOWEL DISPOSABLE) ×2 IMPLANT
TOWEL OR NON WOVEN STRL DISP B (DISPOSABLE) ×2 IMPLANT
TRAY LAPAROSCOPIC (CUSTOM PROCEDURE TRAY) ×2 IMPLANT
TROCAR XCEL BLUNT TIP 100MML (ENDOMECHANICALS) ×2 IMPLANT
TROCAR XCEL NON-BLD 5MMX100MML (ENDOMECHANICALS) ×2 IMPLANT
TUBE CONNECTING 20X1/4 (TUBING) ×2 IMPLANT

## 2013-01-24 NOTE — Op Note (Signed)
Laparoscopic Cholecystectomy Procedure Note  Indications: This patient presents with symptomatic gallbladder disease and will undergo laparoscopic cholecystectomy.  Pre-operative Diagnosis: biliary dyskinesia  Post-operative Diagnosis: Same  Surgeon: Abigail Miyamoto A   Assistants: 0  Anesthesia: General endotracheal anesthesia  ASA Class: 2  Procedure Details  The patient was seen again in the Holding Room. The risks, benefits, complications, treatment options, and expected outcomes were discussed with the patient. The possibilities of reaction to medication, pulmonary aspiration, perforation of viscus, bleeding, recurrent infection, finding a normal gallbladder, the need for additional procedures, failure to diagnose a condition, the possible need to convert to an open procedure, and creating a complication requiring transfusion or operation were discussed with the patient. The likelihood of improving the patient's symptoms with return to their baseline status is good.  The patient and/or family concurred with the proposed plan, giving informed consent. The site of surgery properly noted. The patient was taken to Operating Room, identified as [name] and the procedure verified as Laparoscopic Cholecystectomy with Intraoperative Cholangiogram. A Time Out was held and the above information confirmed.  Prior to the induction of general anesthesia, antibiotic prophylaxis was administered. General endotracheal anesthesia was then administered and tolerated well. After the induction, the abdomen was prepped with Chloraprep and draped in sterile fashion. The patient was positioned in the supine position.  Local anesthetic agent was injected into the skin near the umbilicus and an incision made. We dissected down to the abdominal fascia with blunt dissection.  The fascia was incised vertically and we entered the peritoneal cavity bluntly.  A pursestring suture of 0-Vicryl was placed around the  fascial opening.  The Hasson cannula was inserted and secured with the stay suture.  Pneumoperitoneum was then created with CO2 and tolerated well without any adverse changes in the patient's vital signs. An 11-mm port was placed in the subxiphoid position.  Two 5-mm ports were placed in the right upper quadrant. All skin incisions were infiltrated with a local anesthetic agent before making the incision and placing the trocars.   We positioned the patient in reverse Trendelenburg, tilted slightly to the patient's left.  The gallbladder was identified, the fundus grasped and retracted cephalad. Adhesions were lysed bluntly and with the electrocautery where indicated, taking care not to injure any adjacent organs or viscus. The infundibulum was grasped and retracted laterally, exposing the peritoneum overlying the triangle of Calot. This was then divided and exposed in a blunt fashion. The cystic duct was clearly identified and bluntly dissected circumferentially. A critical view of the cystic duct and cystic artery was obtained.  The cystic duct was then ligated with clips and divided. The cystic artery was, dissected free, ligated with clips and divided as well.   The gallbladder was dissected from the liver bed in retrograde fashion with the electrocautery. The gallbladder was removed and placed in an Endocatch sac. The liver bed was irrigated and inspected. Hemostasis was achieved with the electrocautery. Copious irrigation was utilized and was repeatedly aspirated until clear.  The gallbladder and Endocatch sac were then removed through the umbilical port site.  The pursestring suture was used to close the umbilical fascia.    We again inspected the right upper quadrant for hemostasis.  Pneumoperitoneum was released as we removed the trocars.  4-0 Monocryl was used to close the skin.   Benzoin, steri-strips, and clean dressings were applied. The patient was then extubated and brought to the recovery room  in stable condition. Instrument, sponge, and needle  counts were correct at closure and at the conclusion of the case.   Findings: Mild chronic Cholecystitis without Cholelithiasis  Estimated Blood Loss: Minimal         Drains: 0         Specimens: Gallbladder           Complications: None; patient tolerated the procedure well.         Disposition: PACU - hemodynamically stable.         Condition: stable

## 2013-01-24 NOTE — Telephone Encounter (Signed)
Patient called stating she has shortness of breath following her gallbladder surgery today. She has a history of asthma. She has no unusual abdominal pain. I advised her to go to the emergency room to be evaluated and she stated she would do this.

## 2013-01-24 NOTE — Anesthesia Postprocedure Evaluation (Signed)
Anesthesia Post Note  Patient: [name]  Procedure(s) Performed: Procedure(s) (LRB): LAPAROSCOPIC CHOLECYSTECTOMY (N/A)  Anesthesia type: general  Patient location: PACU  Post pain: Pain level controlled  Post assessment: Patient's Cardiovascular Status Stable  Last Vitals:  Filed Vitals:   01/24/13 1115  BP: 132/70  Pulse: 82  Temp:   Resp: 17    Post vital signs: Reviewed and stable  Level of consciousness: sedated  Complications: No apparent anesthesia complications

## 2013-01-24 NOTE — Anesthesia Preprocedure Evaluation (Addendum)
Anesthesia Evaluation  Patient identified by MRN, date of birth, ID band Patient awake    Reviewed: Allergy & Precautions, H&P , NPO status , Patient's Chart, lab work & pertinent test results, reviewed documented beta blocker date and time   History of Anesthesia Complications (+) PROLONGED EMERGENCE  Airway Mallampati: II TM Distance: >3 FB     Dental  (+) Teeth Intact and Dental Advisory Given   Pulmonary asthma ,    Pulmonary exam normal       Cardiovascular hypertension, Pt. on medications and Pt. on home beta blockers + Past MI  Lex Myoview 8/14:  Normal, EF 74%   Neuro/Psych  Headaches, Seizures -, Well Controlled,  Vagal Neve Stimulator negative psych ROS   GI/Hepatic Neg liver ROS, GERD-  Medicated,  Endo/Other  Hypothyroidism Morbid obesity  Renal/GU negative Renal ROS     Musculoskeletal   Abdominal   Peds  Hematology negative hematology ROS (+)   Anesthesia Other Findings   Reproductive/Obstetrics                         Anesthesia Physical Anesthesia Plan  ASA: III  Anesthesia Plan: General   Post-op Pain Management:    Induction: Intravenous  Airway Management Planned: Oral ETT  Additional Equipment:   Intra-op Plan:   Post-operative Plan: Extubation in OR  Informed Consent: I have reviewed the patients History and Physical, chart, labs and discussed the procedure including the risks, benefits and alternatives for the proposed anesthesia with the patient or authorized representative who has indicated his/her understanding and acceptance.   Dental advisory given  Plan Discussed with: CRNA, Anesthesiologist and Surgeon  Anesthesia Plan Comments: (Discussed VNS (Model 102 [employer]) with our local rep: Apolinar Junes [phone]) and the [employer] 24-hour Clinical Technical Support Tel: [phone] ([place] and [place]).  They recommend placing the bovie pad so that the  generator is not in the current path: in this case the thigh.  They do not recommend turning the device off.  They recommend instructing the patient to have the device interrogated after surgery as soon as is convenient by her local Neurologist, stating that it does not need to occur before discharge from [facility].  If the device seems to be malfunctioning after surgery, the device should be turned off by using the magnet, and the Neurologist called. This plan was discussed with Ms. Sherrine Maples and she agreed to have the VNS evaluated by her neurologist as soon as she can.)     Anesthesia Quick Evaluation

## 2013-01-24 NOTE — Interval H&P Note (Signed)
History and Physical Interval Note:  No change in H and P  01/24/2013 7:07 AM  [name]  has presented today for surgery, with the diagnosis of biliary dyskinesia  The various methods of treatment have been discussed with the patient and family. After consideration of risks, benefits and other options for treatment, the patient has consented to  Procedure(s): LAPAROSCOPIC CHOLECYSTECTOMY (N/A) as a surgical intervention .  The patient's history has been reviewed, patient examined, no change in status, stable for surgery.  I have reviewed the patient's chart and labs.  Questions were answered to the patient's satisfaction.     Hays Dunnigan A

## 2013-01-24 NOTE — Transfer of Care (Signed)
Immediate Anesthesia Transfer of Care Note  Patient: [name]  Procedure(s) Performed: Procedure(s): LAPAROSCOPIC CHOLECYSTECTOMY (N/A)  Patient Location: PACU  Anesthesia Type:General  Level of Consciousness: awake, confused and responds to stimulation  Airway & Oxygen Therapy: Patient Spontanous Breathing and Patient connected to face mask oxygen  Post-op Assessment: Report given to PACU RN and Post -op Vital signs reviewed and stable  Post vital signs: Reviewed and stable  Complications: No apparent anesthesia complications

## 2013-01-24 NOTE — Anesthesia Procedure Notes (Signed)
Procedure Name: Intubation Date/Time: 01/24/2013 8:17 AM Performed by: Gar Gibbon Pre-anesthesia Checklist: Patient identified, Emergency Drugs available, Suction available and Patient being monitored Patient Re-evaluated:Patient Re-evaluated prior to inductionOxygen Delivery Method: Circle System Utilized Preoxygenation: Pre-oxygenation with 100% oxygen Intubation Type: IV induction Ventilation: Mask ventilation without difficulty Laryngoscope Size: Mac and 3 Tube type: Oral Tube size: 7.0 mm Number of attempts: 1 Airway Equipment and Method: stylet and oral airway Placement Confirmation: ETT inserted through vocal cords under direct vision,  positive ETCO2 and breath sounds checked- equal and bilateral Secured at: 21 cm Tube secured with: Tape Dental Injury: Teeth and Oropharynx as per pre-operative assessment

## 2013-01-28 ENCOUNTER — Encounter (HOSPITAL_BASED_OUTPATIENT_CLINIC_OR_DEPARTMENT_OTHER): Payer: Self-pay | Admitting: Surgery

## 2013-02-04 ENCOUNTER — Ambulatory Visit (INDEPENDENT_AMBULATORY_CARE_PROVIDER_SITE_OTHER): Payer: Medicare Other | Admitting: Surgery

## 2013-02-04 VITALS — BP 126/74 | HR 68 | Resp 16 | Ht 65.5 in | Wt 241.2 lb

## 2013-02-04 DIAGNOSIS — Z09 Encounter for follow-up examination after completed treatment for conditions other than malignant neoplasm: Secondary | ICD-10-CM

## 2013-02-04 NOTE — Progress Notes (Signed)
Subjective:     Patient ID: [name], female   DOB: 09-04-1965, 47 y.o.   MRN: [id number]  HPI She is here for first postop visit status post laparoscopic cholecystectomy. She still is having some epigastric pain, nausea, vomiting, and reflux. She has no diarrhea.  Review of Systems     Objective:   Physical Exam On exam, she is well in appearance. Her incisions are healing well. The final pathology of the gallbladder was fairly unremarkable with only benign polyps    Assessment:     Patient stable postop     Plan:     This is a difficult situation. I wonder if her symptoms are related to her vagal nerve stimulator. From a surgical standpoint, I have nothing further to offer. I believe she needs to see Dr. Loreta Ave for further evaluation. I will see her back as needed

## 2013-02-09 ENCOUNTER — Emergency Department (HOSPITAL_COMMUNITY): Payer: Medicare Other

## 2013-02-09 ENCOUNTER — Encounter (HOSPITAL_COMMUNITY): Payer: Self-pay | Admitting: Emergency Medicine

## 2013-02-09 ENCOUNTER — Observation Stay (HOSPITAL_COMMUNITY)
Admission: EM | Admit: 2013-02-09 | Discharge: 2013-02-10 | Disposition: A | Discharge status: Home / Self Care | Payer: Medicare Other | Attending: Internal Medicine | Admitting: Internal Medicine

## 2013-02-09 DIAGNOSIS — R0602 Shortness of breath: Secondary | ICD-10-CM | POA: Insufficient documentation

## 2013-02-09 DIAGNOSIS — R0789 Other chest pain: Secondary | ICD-10-CM

## 2013-02-09 DIAGNOSIS — R079 Chest pain, unspecified: Principal | ICD-10-CM | POA: Insufficient documentation

## 2013-02-09 DIAGNOSIS — E785 Hyperlipidemia, unspecified: Secondary | ICD-10-CM

## 2013-02-09 DIAGNOSIS — R0600 Dyspnea, unspecified: Secondary | ICD-10-CM

## 2013-02-09 DIAGNOSIS — R0989 Other specified symptoms and signs involving the circulatory and respiratory systems: Secondary | ICD-10-CM | POA: Insufficient documentation

## 2013-02-09 DIAGNOSIS — K828 Other specified diseases of gallbladder: Secondary | ICD-10-CM

## 2013-02-09 DIAGNOSIS — Z9089 Acquired absence of other organs: Secondary | ICD-10-CM | POA: Insufficient documentation

## 2013-02-09 DIAGNOSIS — M25519 Pain in unspecified shoulder: Secondary | ICD-10-CM | POA: Insufficient documentation

## 2013-02-09 DIAGNOSIS — R0609 Other forms of dyspnea: Secondary | ICD-10-CM | POA: Insufficient documentation

## 2013-02-09 DIAGNOSIS — I10 Essential (primary) hypertension: Secondary | ICD-10-CM | POA: Insufficient documentation

## 2013-02-09 DIAGNOSIS — M542 Cervicalgia: Secondary | ICD-10-CM | POA: Insufficient documentation

## 2013-02-09 DIAGNOSIS — R61 Generalized hyperhidrosis: Secondary | ICD-10-CM | POA: Insufficient documentation

## 2013-02-09 DIAGNOSIS — G40909 Epilepsy, unspecified, not intractable, without status epilepticus: Secondary | ICD-10-CM | POA: Insufficient documentation

## 2013-02-09 DIAGNOSIS — K219 Gastro-esophageal reflux disease without esophagitis: Secondary | ICD-10-CM | POA: Insufficient documentation

## 2013-02-09 DIAGNOSIS — R002 Palpitations: Secondary | ICD-10-CM | POA: Insufficient documentation

## 2013-02-09 DIAGNOSIS — I252 Old myocardial infarction: Secondary | ICD-10-CM | POA: Insufficient documentation

## 2013-02-09 DIAGNOSIS — R112 Nausea with vomiting, unspecified: Secondary | ICD-10-CM | POA: Insufficient documentation

## 2013-02-09 LAB — COMPREHENSIVE METABOLIC PANEL
ALT: 19 U/L (ref 0–35)
AST: 13 U/L (ref 0–37)
Albumin: 3.6 g/dL (ref 3.5–5.2)
Alkaline Phosphatase: 66 U/L (ref 39–117)
BUN: 11 mg/dL (ref 6–23)
CO2: 20 mEq/L (ref 19–32)
Calcium: 8.9 mg/dL (ref 8.4–10.5)
Chloride: 106 mEq/L (ref 96–112)
Creatinine, Ser: 0.67 mg/dL (ref 0.50–1.10)
GFR calc Af Amer: >90 mL/min (ref >90)
GFR calc non Af Amer: >90 mL/min (ref >90)
Glucose, Bld: 93 mg/dL (ref 70–99)
Potassium: 3.9 mEq/L (ref 3.5–5.1)
Sodium: 137 mEq/L (ref 135–145)
Total Bilirubin: 0.2 mg/dL — ABNORMAL LOW (ref 0.3–1.2)
Total Protein: 7.2 g/dL (ref 6.0–8.3)

## 2013-02-09 LAB — CBC WITH DIFFERENTIAL/PLATELET
Basophils Absolute: 0 10*3/uL (ref 0.0–0.1)
Basophils Relative: 0 % (ref 0–1)
Eosinophils Absolute: 0.2 10*3/uL (ref 0.0–0.7)
Eosinophils Relative: 4 % (ref 0–5)
HCT: 37.3 % (ref 36.0–46.0)
Hemoglobin: 12.5 g/dL (ref 12.0–15.0)
Lymphocytes Relative: 39 % (ref 12–46)
Lymphs Abs: 2.3 10*3/uL (ref 0.7–4.0)
MCH: 27.7 pg (ref 26.0–34.0)
MCHC: 33.5 g/dL (ref 30.0–36.0)
MCV: 82.5 fL (ref 78.0–100.0)
Monocytes Absolute: 0.6 10*3/uL (ref 0.1–1.0)
Monocytes Relative: 10 % (ref 3–12)
Neutro Abs: 2.7 10*3/uL (ref 1.7–7.7)
Neutrophils Relative %: 47 % (ref 43–77)
Platelets: 232 10*3/uL (ref 150–400)
RBC: 4.52 MIL/uL (ref 3.87–5.11)
RDW: 13.9 % (ref 11.5–15.5)
WBC: 5.8 10*3/uL (ref 4.0–10.5)

## 2013-02-09 LAB — TROPONIN I: Troponin I: <0.3 ng/mL (ref <0.30)

## 2013-02-09 LAB — LIPASE, BLOOD: Lipase: 34 U/L (ref 11–59)

## 2013-02-09 LAB — D-DIMER, QUANTITATIVE: D-Dimer, Quant: <0.27 ug/mL-FEU (ref 0.00–0.48)

## 2013-02-09 MED ORDER — MORPHINE SULFATE 4 MG/ML IJ SOLN
4.0000 mg | Freq: Once | INTRAMUSCULAR | Status: AC
  Administered 2013-02-09: 4 mg via INTRAVENOUS
  Filled 2013-02-09: qty 1

## 2013-02-09 MED ORDER — NITROGLYCERIN 2 % TD OINT
1.0000 [in_us] | TOPICAL_OINTMENT | Freq: Once | TRANSDERMAL | Status: AC
  Administered 2013-02-09: 1 [in_us] via TOPICAL
  Filled 2013-02-09: qty 1

## 2013-02-09 MED ORDER — HEPARIN BOLUS VIA INFUSION
4000.0000 [IU] | Freq: Once | INTRAVENOUS | Status: DC
  Filled 2013-02-09: qty 4000

## 2013-02-09 MED ORDER — HEPARIN (PORCINE) IN NACL 100-0.45 UNIT/ML-% IJ SOLN
1250.0000 [IU]/h | INTRAMUSCULAR | Status: DC
  Filled 2013-02-09: qty 250

## 2013-02-09 MED ORDER — ONDANSETRON HCL 4 MG/2ML IJ SOLN
4.0000 mg | Freq: Once | INTRAMUSCULAR | Status: AC
  Administered 2013-02-09: 4 mg via INTRAVENOUS
  Filled 2013-02-09: qty 2

## 2013-02-09 MED ORDER — GI COCKTAIL ~~LOC~~
30.0000 mL | Freq: Once | ORAL | Status: AC
  Administered 2013-02-10: 30 mL via ORAL
  Filled 2013-02-09: qty 30

## 2013-02-09 MED ORDER — NITROGLYCERIN 0.4 MG SL SUBL
0.4000 mg | SUBLINGUAL_TABLET | SUBLINGUAL | Status: DC | PRN
  Administered 2013-02-09: 0.4 mg via SUBLINGUAL
  Filled 2013-02-09: qty 25

## 2013-02-09 NOTE — ED Notes (Signed)
Patient transported to X-ray 

## 2013-02-09 NOTE — ED Provider Notes (Addendum)
47 year old female who is approximately 2 and half weeks status post cholecystectomy and also status post non-STEMI comes in with chest pain which has waxed and waned during the course of the day. She describes a pressure feeling with associated dyspnea, nausea, diaphoresis. When it is at its worst, the pain is somewhat worse with a deep breath. She thought the pain might be related to GERD and midodrine related to her recent cholecystectomy. She did take nitroglycerin which did give significant but not complete relief. She states that her heart catheterization following her non-STEMI showed no blockages. I reviewed her record on EPIC and cardiology evaluation included a normal stress Myoview and reports that she had had extensive workup in the past. There is some suggestion that she might have either small vessel disease or coronary spasm. On exam, lungs are clear and heart has regular rate and rhythm. Patient does state that for her non-STEMI that she had a negative ECG and MI was only found with the testing heart enzymes. Because of recent surgery, d-dimer will be obtained to screen for pulmonary embolism but anticipate needing to admit her for further cardiac evaluation.   Date: 02/09/2013  Rate: 86  Rhythm: normal sinus rhythm  QRS Axis: normal  Intervals: normal  ST/T Wave abnormalities: nonspecific T wave changes  Conduction Disutrbances:none  Narrative Interpretation: Left ventricular hypertrophy with secondary repolarization changes. When compared with ECG of 09/18/2012, no significant changes are seen..  Old EKG Reviewed: unchanged  Medical screening examination/treatment/procedure(s) were conducted as a shared visit with non-physician practitioner(s) and myself.  I personally evaluated the patient during the encounter  Dione Booze, MD 02/09/13 2021  Dione Booze, MD 02/09/13 2206

## 2013-02-09 NOTE — ED Provider Notes (Signed)
CSN: [id number]     Arrival date & time 02/09/13  1917 History   First MD Initiated Contact with Patient 02/09/13 1937     Chief Complaint  Patient presents with  . Chest Pain   (Consider location/radiation/quality/duration/timing/severity/associated sxs/prior Treatment) Patient is a 47 y.o. female presenting with chest pain. The history is provided by the patient and medical records.  Chest Pain Associated symptoms: nausea, shortness of breath and vomiting    Patient presents to the ED via EMS for chest pain since 0830.  Pain described as a midsternal chest pressure with radiation into bilateral shoulders and neck. There is associated shortness of breath, palpitations, diaphoresis, nausea, and vomiting. Pain was initially relieved with sublingual nitroglycerin but returned.  EMS administered 3 sublingual nitroglycerin in route. No aspirin given, patient is allergic. Patient has cardiac hx-- NSTEMI 1 year ago, states sx today are extremely similar.    No recent travel, LE edema, or calf pain.  Pt did have recent laparoscopic cholecystectomy on 01/24/13 by Dr. Rayburn Ma.  No noted complications with procedure. Only has residual abdominal pain with abrupt or twisting motions.  VS stable on arrival.  Med records reviewed-- negative stress test on 01/09/13.  Last heart cath in May 2013, normal coronary arteries.  Past Medical History  Diagnosis Date  . HTN (hypertension)   . Seizure disorder     since childhood.  . Chest pain 05/15/11, 05/16/11  . Migraine   . Asthma   . Hypothyroid   . GERD (gastroesophageal reflux disease)   . Obesity   . MI (myocardial infarction)   . Complication of anesthesia     hard to wake up-had to be reminded to breath  . Hx of cardiovascular stress test     Lex Myoview 8/14:  Normal, EF 74%   Past Surgical History  Procedure Laterality Date  . Cardiac catheterization      [facility]   . Cesarean section      placement of vagal nerve stimulator.   . Implantation vagal nerve stimulator  [id number]    [facility]attery [facility]g-[facility]  . Cholecystectomy N/A 01/24/2013    Procedure: LAPAROSCOPIC CHOLECYSTECTOMY;  Surgeon: Shelly Rubenstein, MD;  Location: [facility];  Service: General;  Laterality: N/A;   Family History  Problem Relation Age of Onset  . Coronary artery disease Mother 8  . Coronary artery disease Maternal Grandmother   . Cancer Other   . Hypertension Other   . Stroke Other    History  Substance Use Topics  . Smoking status: Never Smoker   . Smokeless tobacco: Never Used  . Alcohol Use: 0.0 oz/week    1-3 Glasses of wine per week     Comment: occasional   OB History   Grav Para Term Preterm Abortions TAB SAB Ect Mult Living                 Review of Systems  Respiratory: Positive for shortness of breath.   Cardiovascular: Positive for chest pain.  Gastrointestinal: Positive for nausea and vomiting.  All other systems reviewed and are negative.    Allergies  Aspirin; Amantadines; Amitriptyline; Amlodipine; Latex; and Simvastatin  Home Medications   Current Outpatient Rx  Name  Route  Sig  Dispense  Refill  . budesonide-formoterol (SYMBICORT) 160-4.5 MCG/ACT inhaler   Inhalation   Inhale 2 puffs into the lungs 2 (two) times daily.         . Coenzyme Q10 200 MG TABS  Oral   Take 1 tablet by mouth every evening. Hold while in hospital      0   . docusate sodium (COLACE) 100 MG capsule   Oral   Take 100 mg by mouth 2 (two) times daily.         . furosemide (LASIX) 20 MG tablet   Oral   Take 20 mg by mouth daily. TAKE 40 MG X 3 DAYS THEN STOP; MAY TAKE 20 MG PRN FOR EDEMA         . furosemide (LASIX) 20 MG tablet   Oral   Take 20 mg by mouth daily.         Marland Kitchen HYDROcodone-acetaminophen (NORCO) 5-325 MG per tablet   Oral   Take 1-2 tablets by mouth every 4 (four) hours as needed for pain.   30 tablet   1   . isosorbide mononitrate (IMDUR) 60 MG 24 hr tablet   Oral   Take 60  mg by mouth 2 (two) times daily.         Marland Kitchen levETIRAcetam (KEPPRA) 500 MG tablet      TAKE 1 TABLET TWICE A DAY   60 tablet   0     Patient Needs To Schedule Appt   . levETIRAcetam (KEPPRA) 500 MG tablet   Oral   Take 500 mg by mouth every 12 (twelve) hours.         Marland Kitchen levothyroxine (SYNTHROID, LEVOTHROID) 50 MCG tablet   Oral   Take 50 mcg by mouth daily.          . metoprolol tartrate (LOPRESSOR) 25 MG tablet   Oral   Take 12.5 mg by mouth 2 (two) times daily.          . nitroGLYCERIN (NITROSTAT) 0.4 MG SL tablet   Sublingual   Place 0.4 mg under the tongue every 5 (five) minutes as needed. For chest pain         . olmesartan (BENICAR) 20 MG tablet   Oral   Take 1 tablet (20 mg total) by mouth daily.   30 tablet   6   . pantoprazole (PROTONIX) 40 MG tablet   Oral   Take 40 mg by mouth daily.         . potassium chloride (K-DUR) 10 MEQ tablet   Oral   Take 10 mEq by mouth daily. TAKE 20 MEQ x 3 DAYS THEN STOP; MAY TAKE 10 MEQ PRN WITH LASIX         . promethazine (PHENERGAN) 25 MG tablet   Oral   Take 25 mg by mouth every 6 (six) hours as needed for nausea.         Marland Kitchen sulfamethoxazole-trimethoprim (BACTRIM DS,SEPTRA DS) 800-160 MG per tablet   Oral   Take 1 tablet by mouth 2 (two) times daily.         Marland Kitchen topiramate (TOPAMAX) 50 MG tablet   Oral   Take 100 mg by mouth 2 (two) times daily.          . SUMAtriptan (IMITREX) 100 MG tablet   Oral   Take 100 mg by mouth every 2 (two) hours as needed. For migraines          BP 128/81  Pulse 89  Temp(Src) 98.2 F (36.8 C) (Oral)  Resp 20  Ht 5\' 5"  (1.651 m)  Wt 241 lb (109.317 kg)  BMI 40.1 kg/m2  SpO2 100%  LMP 01/27/2013  Physical Exam  Nursing note  and vitals reviewed. Constitutional: She is oriented to person, place, and time. She appears well-developed and well-nourished. No distress.  HENT:  Head: Normocephalic and atraumatic.  Mouth/Throat: Oropharynx is clear and moist.   Eyes: Conjunctivae and EOM are normal. Pupils are equal, round, and reactive to light.  Neck: Normal range of motion. Neck supple.  Cardiovascular: Normal rate, regular rhythm and normal heart sounds.   Pulmonary/Chest: Effort normal and breath sounds normal. No respiratory distress. She has no wheezes. She has no rales.  Abdominal: Soft. Bowel sounds are normal. There is no tenderness. There is no guarding.  Well healed lap chole surgical incisions  Musculoskeletal: Normal range of motion. She exhibits no edema.  Neurological: She is alert and oriented to person, place, and time. She has normal strength. She displays no tremor. No cranial nerve deficit or sensory deficit. She displays no seizure activity. Gait normal.  Skin: Skin is warm and dry. She is not diaphoretic.  Psychiatric: She has a normal mood and affect.    ED Course  Procedures (including critical care time)   Date: 02/09/2013  Rate: 86  Rhythm: normal sinus rhythm  QRS Axis: normal  Intervals: normal  ST/T Wave abnormalities: normal  Conduction Disutrbances:none  Narrative Interpretation: NSR, borderline prolonged PR interval, no STEMI  Old EKG Reviewed: unchanged   Labs Review Labs Reviewed  COMPREHENSIVE METABOLIC PANEL - Abnormal; Notable for the following:    Total Bilirubin 0.2 (*)    All other components within normal limits  TROPONIN I  CBC WITH DIFFERENTIAL  LIPASE, BLOOD  D-DIMER, QUANTITATIVE  HEPARIN LEVEL (UNFRACTIONATED)   Imaging Review Dg Chest 2 View  02/09/2013   CLINICAL DATA:  Chest pain and shortness of Breath.  EXAM: CHEST  2 VIEW  COMPARISON:  08/28/2012  FINDINGS: Vagal nerve stimulator noted with leads projecting over the left apex, as before.  The lungs appear clear. The cardiac and mediastinal contours normal. No pleural effusion identified.  IMPRESSION: No active cardiopulmonary disease.   Electronically Signed   By: Herbie Baltimore   On: 02/09/2013 20:56    MDM   1. Chest pain      EKG NSR, no acute ischemic changes.  Trop negative.  Labs as above, largely WNL.  CXR clear.  Consulted hospitalist, Dr. Julian Reil-- prefers cardiology admission.  Consulted Cardiology, Dr. Katha Cabal-- thinks this is not cardiac and does not feel that his service is needed.  I have discussed with him that I have already consulted with hospitalist, pt is actively having chest pain, and we feel his opinion is needed.  I offered to have him speak with hospitalist who is currently in dept, he declined.  He stated "we will not cath her again, she has no evidence of CAD, there is nothing for [place] to do."  Advised to call hospitalist again and hung up before I could respond further.  Again discussed with hospitalist, Dr. Julian Reil who has talked with cardiology.  Medicine will admit to observation, tele.  Temp admit orders placed.  VS stable.  Garlon Hatchet, PA-C 02/10/13 0106

## 2013-02-09 NOTE — ED Notes (Signed)
Patient returned from X-ray 

## 2013-02-09 NOTE — Progress Notes (Signed)
ANTICOAGULATION CONSULT NOTE - Initial Consult  Pharmacy Consult for Heparin Indication: chest pain/ACS  Allergies  Allergen Reactions  . Amantadines Swelling  . Amitriptyline Swelling  . Amlodipine Swelling  . Aspirin Nausea Only    Irritates stomach also  . Latex Hives, Itching and Swelling    Burning also  . Simvastatin Swelling    Muscle pains also    Patient Measurements: Height: 5\' 5"  (165.1 cm) Weight: 241 lb (109.317 kg) IBW/kg (Calculated) : 57 Heparin Dosing Weight: 85 kg   Vital Signs: Temp: 98.2 F (36.8 C) (09/07 1933) Temp src: Oral (09/07 1933) BP: 126/86 mmHg (09/07 2307) Pulse Rate: 78 (09/07 2307)  Labs:  Recent Labs  02/09/13 1939 02/09/13 1948 02/09/13 2005  HGB  --   --  12.5  HCT  --   --  37.3  PLT  --   --  232  CREATININE  --  0.67  --   TROPONINI <0.30  --   --     Estimated Creatinine Clearance: 105.8 ml/min (by C-G formula based on Cr of 0.67).   Medical History: Past Medical History  Diagnosis Date  . HTN (hypertension)   . Seizure disorder     since childhood.  . Chest pain 05/15/11, 05/16/11  . Migraine   . Asthma   . Hypothyroid   . GERD (gastroesophageal reflux disease)   . Obesity   . MI (myocardial infarction)   . Complication of anesthesia     hard to wake up-had to be reminded to breath  . Hx of cardiovascular stress test     Lex Myoview 8/14:  Normal, EF 74%    Medications:  Symbicort Colace  Lasix  Norco  Motrin  Imdur  Keppra   Synthroid  Lopressor  Ntg  Benicar  Protonix  KCl  Phenergan  Bactrim  Imitrex  Topamax  Assessment: 47 yo female with chest pain for heparin  Goal of Therapy:  Heparin level 0.3-0.7 units/ml Monitor platelets by anticoagulation protocol: Yes   Plan:  Heparin 4000 units IV bolus, then 1250 units/hr Check heparin level in 6 hours.   Eddie Candle 02/09/2013,11:24 PM

## 2013-02-09 NOTE — ED Notes (Signed)
Patient called out stating chest pain was a 10 at 2248. She was hyperventilating and clutching her chest and loudly moaning. Gave nitro SL at 2248 and talked patient into slow, deep breaths to conserve energy and work for the heart. EKG done. VSS. Gave second nitro at 2254. Patient calmer and stated pain down to an 8. Stated she felt nauseated. No vomiting, no diaphoresis. Gave Morphine and Zofran at 2257. Currently states pain is back down to a 3 now at 2300. She is talking in full sentences and reports feeling better at this time.

## 2013-02-09 NOTE — ED Notes (Signed)
Chest pain this morning 0830, took nitro and was relieved. CP returned at 1830 and nitro did not relieve. Felt nauseated and sweaty. Called EMS. 3 nitro SL in route by EMS (no ASA because patient allergic). Currently pain a 2/10. Pain in center of chest, epigastric area. Patient reports feeling the same as she did 1 year ago when she had a NSTEMI

## 2013-02-10 ENCOUNTER — Encounter (HOSPITAL_COMMUNITY): Payer: Self-pay | Admitting: *Deleted

## 2013-02-10 DIAGNOSIS — K219 Gastro-esophageal reflux disease without esophagitis: Secondary | ICD-10-CM | POA: Diagnosis present

## 2013-02-10 DIAGNOSIS — R079 Chest pain, unspecified: Secondary | ICD-10-CM

## 2013-02-10 DIAGNOSIS — R0789 Other chest pain: Secondary | ICD-10-CM

## 2013-02-10 LAB — BASIC METABOLIC PANEL
BUN: 9 mg/dL (ref 6–23)
CO2: 18 mEq/L — ABNORMAL LOW (ref 19–32)
Calcium: 8.6 mg/dL (ref 8.4–10.5)
Chloride: 106 mEq/L (ref 96–112)
Creatinine, Ser: 0.64 mg/dL (ref 0.50–1.10)
GFR calc Af Amer: >90 mL/min (ref >90)
GFR calc non Af Amer: >90 mL/min (ref >90)
Glucose, Bld: 102 mg/dL — ABNORMAL HIGH (ref 70–99)
Potassium: 4.4 mEq/L (ref 3.5–5.1)
Sodium: 135 mEq/L (ref 135–145)

## 2013-02-10 LAB — CBC
HCT: 36.6 % (ref 36.0–46.0)
Hemoglobin: 11.9 g/dL — ABNORMAL LOW (ref 12.0–15.0)
MCH: 27.2 pg (ref 26.0–34.0)
MCHC: 32.5 g/dL (ref 30.0–36.0)
MCV: 83.8 fL (ref 78.0–100.0)
Platelets: 197 10*3/uL (ref 150–400)
RBC: 4.37 MIL/uL (ref 3.87–5.11)
RDW: 14.2 % (ref 11.5–15.5)
WBC: 4.5 10*3/uL (ref 4.0–10.5)

## 2013-02-10 LAB — TROPONIN I
Troponin I: <0.3 ng/mL (ref <0.30)
Troponin I: <0.3 ng/mL (ref <0.30)
Troponin I: <0.3 ng/mL (ref <0.30)

## 2013-02-10 LAB — MRSA PCR SCREENING: MRSA by PCR: NEGATIVE

## 2013-02-10 MED ORDER — LEVOTHYROXINE SODIUM 50 MCG PO TABS
50.0000 ug | ORAL_TABLET | Freq: Every day | ORAL | Status: DC
  Administered 2013-02-10: 50 ug via ORAL
  Filled 2013-02-10 (×2): qty 1

## 2013-02-10 MED ORDER — PROMETHAZINE HCL 25 MG PO TABS: 25.0000 mg | ORAL_TABLET | Freq: Four times a day (QID) | ORAL | Status: DC | PRN

## 2013-02-10 MED ORDER — SUCRALFATE 1 GM/10ML PO SUSP: 1.0000 g | Freq: Four times a day (QID) | ORAL | Status: DC

## 2013-02-10 MED ORDER — GI COCKTAIL ~~LOC~~
30.0000 mL | Freq: Once | ORAL | Status: AC
  Administered 2013-02-10: 30 mL via ORAL
  Filled 2013-02-10: qty 30

## 2013-02-10 MED ORDER — METOPROLOL TARTRATE 12.5 MG HALF TABLET
12.5000 mg | ORAL_TABLET | Freq: Two times a day (BID) | ORAL | Status: DC
  Administered 2013-02-10 (×2): 12.5 mg via ORAL
  Filled 2013-02-10 (×3): qty 1

## 2013-02-10 MED ORDER — SULFAMETHOXAZOLE-TMP DS 800-160 MG PO TABS
1.0000 | ORAL_TABLET | Freq: Two times a day (BID) | ORAL | Status: DC
  Administered 2013-02-10: 1 via ORAL
  Filled 2013-02-10 (×2): qty 1

## 2013-02-10 MED ORDER — BUDESONIDE-FORMOTEROL FUMARATE 160-4.5 MCG/ACT IN AERO
2.0000 | INHALATION_SPRAY | Freq: Two times a day (BID) | RESPIRATORY_TRACT | Status: DC
  Administered 2013-02-10: 2 via RESPIRATORY_TRACT
  Filled 2013-02-10: qty 6

## 2013-02-10 MED ORDER — POTASSIUM CHLORIDE ER 10 MEQ PO TBCR: 10.0000 meq | EXTENDED_RELEASE_TABLET | Freq: Every day | ORAL | Status: DC

## 2013-02-10 MED ORDER — TOPIRAMATE 100 MG PO TABS
100.0000 mg | ORAL_TABLET | Freq: Two times a day (BID) | ORAL | Status: DC
  Administered 2013-02-10 (×2): 100 mg via ORAL
  Filled 2013-02-10 (×3): qty 1

## 2013-02-10 MED ORDER — PANTOPRAZOLE SODIUM 40 MG PO TBEC: 40.0000 mg | DELAYED_RELEASE_TABLET | Freq: Two times a day (BID) | ORAL | Status: DC

## 2013-02-10 MED ORDER — SODIUM CHLORIDE 0.9 % IJ SOLN
3.0000 mL | Freq: Two times a day (BID) | INTRAMUSCULAR | Status: DC
  Administered 2013-02-10 (×2): 3 mL via INTRAVENOUS

## 2013-02-10 MED ORDER — HEPARIN SODIUM (PORCINE) 5000 UNIT/ML IJ SOLN
5000.0000 [IU] | Freq: Three times a day (TID) | INTRAMUSCULAR | Status: DC
  Filled 2013-02-10: qty 1

## 2013-02-10 MED ORDER — IRBESARTAN 150 MG PO TABS
150.0000 mg | ORAL_TABLET | Freq: Every day | ORAL | Status: DC
  Administered 2013-02-10: 150 mg via ORAL
  Filled 2013-02-10: qty 1

## 2013-02-10 MED ORDER — PANTOPRAZOLE SODIUM 40 MG IV SOLR: 40.0000 mg | Freq: Two times a day (BID) | INTRAVENOUS | Status: DC

## 2013-02-10 MED ORDER — SULFAMETHOXAZOLE-TRIMETHOPRIM 800-160 MG PO TABS: 1.0000 | ORAL_TABLET | Freq: Two times a day (BID) | ORAL | Status: DC

## 2013-02-10 MED ORDER — LEVETIRACETAM 500 MG PO TABS
500.0000 mg | ORAL_TABLET | Freq: Two times a day (BID) | ORAL | Status: DC
  Administered 2013-02-10: 500 mg via ORAL
  Filled 2013-02-10 (×3): qty 1

## 2013-02-10 MED ORDER — SODIUM CHLORIDE 0.9 % IV SOLN
80.0000 mg | Freq: Once | INTRAVENOUS | Status: AC
  Administered 2013-02-10: 80 mg via INTRAVENOUS
  Filled 2013-02-10: qty 80

## 2013-02-10 MED ORDER — NITROGLYCERIN 2 % TD OINT: 1.0000 [in_us] | TOPICAL_OINTMENT | Freq: Four times a day (QID) | TRANSDERMAL | Status: DC | PRN

## 2013-02-10 MED ORDER — BUDESONIDE-FORMOTEROL FUMARATE 160-4.5 MCG/ACT IN AERO: 2.0000 | INHALATION_SPRAY | Freq: Two times a day (BID) | RESPIRATORY_TRACT | Status: DC

## 2013-02-10 MED ORDER — HYDROMORPHONE HCL PF 1 MG/ML IJ SOLN
1.0000 mg | INTRAMUSCULAR | Status: DC | PRN
  Administered 2013-02-10: 1 mg via INTRAVENOUS
  Filled 2013-02-10: qty 1

## 2013-02-10 MED ORDER — HEPARIN SODIUM (PORCINE) 5000 UNIT/ML IJ SOLN
5000.0000 [IU] | Freq: Three times a day (TID) | INTRAMUSCULAR | Status: DC
  Filled 2013-02-10 (×3): qty 1

## 2013-02-10 MED ORDER — DOCUSATE SODIUM 100 MG PO CAPS
100.0000 mg | ORAL_CAPSULE | Freq: Two times a day (BID) | ORAL | Status: DC
  Administered 2013-02-10: 100 mg via ORAL
  Filled 2013-02-10 (×2): qty 1

## 2013-02-10 MED ORDER — HYDROMORPHONE HCL PF 1 MG/ML IJ SOLN: 1.0000 mg | INTRAMUSCULAR | Status: DC | PRN

## 2013-02-10 MED ORDER — PANTOPRAZOLE SODIUM 40 MG IV SOLR
8.0000 mg/h | INTRAVENOUS | Status: DC
  Filled 2013-02-10 (×2): qty 80

## 2013-02-10 MED ORDER — SUCRALFATE 1 GM/10ML PO SUSP
1.0000 g | Freq: Three times a day (TID) | ORAL | Status: DC
  Filled 2013-02-10 (×3): qty 10

## 2013-02-10 MED ORDER — ISOSORBIDE MONONITRATE ER 60 MG PO TB24
60.0000 mg | ORAL_TABLET | Freq: Two times a day (BID) | ORAL | Status: DC
  Administered 2013-02-10: 60 mg via ORAL
  Filled 2013-02-10 (×2): qty 1

## 2013-02-10 MED ORDER — ONDANSETRON HCL 4 MG/2ML IJ SOLN: 4.0000 mg | Freq: Three times a day (TID) | INTRAMUSCULAR | Status: AC | PRN

## 2013-02-10 NOTE — Progress Notes (Signed)
Pt discharged to home per MD order. Pt received and reviewed all discharge instructions and medication information including follow-up appointments and prescription information. Pt verbalized understanding. Pt alert and oriented at discharge with no complaints of pain. Pt escorted to private vehicle via wheelchair by nurse tech. Nishita Isaacks C  

## 2013-02-10 NOTE — ED Notes (Signed)
Per verbal order from Dr. Julian Reil- hold heparin until further notice.

## 2013-02-10 NOTE — Progress Notes (Signed)
Utilization review completed.  

## 2013-02-10 NOTE — H&P (Signed)
[facility] History and Physical  [name] JXB:[id number] DOB: Apr 12, 1966 DOA: 02/09/2013  Referring physician: ED PCP: Genia Hotter, MD   Chief Complaint: Chest pain  HPI: [name] is a 47 y.o. female who presents to the ED with midline chest pain.  Symptoms include midsternal chest pressure with radiation into B neck and shoulders.  Pain initially relieved with NTG but returned.  The patients cardiac history is significant for an elevated troponin with chest pain occuring last year in [place], troponin peaked at 0.98, however patient had Left and R heart cath at that time which demonstrated completely normal coronary arteries.  The patient has since had 2 stress tests both of which were normal and the last of these stress tests was done 1 month ago to the day.  Stress test was done as a cardiac clearance for cholecystectomy which she had twords the end of last month.  Patient also reports a long history of severe GERD, which despite protonix has remained so severe that she does not sleep lying down (or she winds up with acid in her throat she states).  Review of Systems: 12 systems reviewed and otherwise negative.  Past Medical History  Diagnosis Date  . HTN (hypertension)   . Seizure disorder     since childhood.  . Chest pain 05/15/11, 05/16/11  . Migraine   . Asthma   . Hypothyroid   . GERD (gastroesophageal reflux disease)   . Obesity   . MI (myocardial infarction)   . Complication of anesthesia     hard to wake up-had to be reminded to breath  . Hx of cardiovascular stress test     Lex Myoview 8/14:  Normal, EF 74%   Past Surgical History  Procedure Laterality Date  . Cardiac catheterization      [facility]   . Cesarean section      placement of vagal nerve stimulator.  . Implantation vagal nerve stimulator  [id number]    [facility]attery chg-[facility]  . Cholecystectomy N/A 01/24/2013    Procedure: LAPAROSCOPIC CHOLECYSTECTOMY;  Surgeon: Shelly Rubenstein, MD;  Location: [facility];  Service: General;  Laterality: N/A;   Social History:  reports that she has never smoked. She has never used smokeless tobacco. She reports that  drinks alcohol. She reports that she does not use illicit drugs.   Allergies  Allergen Reactions  . Amantadines Swelling  . Amitriptyline Swelling  . Amlodipine Swelling  . Aspirin Nausea Only    Irritates stomach also  . Latex Hives, Itching and Swelling    Burning also  . Simvastatin Swelling    Muscle pains also    Family History  Problem Relation Age of Onset  . Coronary artery disease Mother 69  . Coronary artery disease Maternal Grandmother   . Cancer Other   . Hypertension Other   . Stroke Other     Prior to Admission medications   Medication Sig Start Date End Date Taking? Authorizing Provider  budesonide-formoterol (SYMBICORT) 160-4.5 MCG/ACT inhaler Inhale 2 puffs into the lungs 2 (two) times daily.   Yes Historical Provider, MD  Coenzyme Q10 200 MG TABS Take 1 tablet by mouth every evening. Hold while in hospital 02/07/12  Yes Laurey Morale, MD  docusate sodium (COLACE) 100 MG capsule Take 100 mg by mouth 2 (two) times daily.   Yes Historical Provider, MD  furosemide (LASIX) 20 MG tablet Take 20 mg by mouth daily. TAKE 40 MG X 3  DAYS THEN STOP; MAY TAKE 20 MG PRN FOR EDEMA 05/09/12  Yes Beatrice Lecher, PA-C  furosemide (LASIX) 20 MG tablet Take 20 mg by mouth daily as needed for edema.    Yes Historical Provider, MD  HYDROcodone-acetaminophen (NORCO) 5-325 MG per tablet Take 1-2 tablets by mouth every 4 (four) hours as needed for pain. 01/24/13  Yes Shelly Rubenstein, MD  ibuprofen (ADVIL,MOTRIN) 200 MG tablet Take 200 mg by mouth every 6 (six) hours as needed for pain.   Yes Historical Provider, MD  isosorbide mononitrate (IMDUR) 60 MG 24 hr tablet Take 60 mg by mouth 2 (two) times daily.   Yes Historical Provider, MD  levETIRAcetam (KEPPRA) 500 MG tablet Take 500 mg by  mouth every 12 (twelve) hours.   Yes Historical Provider, MD  levothyroxine (SYNTHROID, LEVOTHROID) 50 MCG tablet Take 50 mcg by mouth daily.    Yes Historical Provider, MD  metoprolol tartrate (LOPRESSOR) 25 MG tablet Take 12.5 mg by mouth 2 (two) times daily.    Yes Historical Provider, MD  nitroGLYCERIN (NITROSTAT) 0.4 MG SL tablet Place 0.4 mg under the tongue every 5 (five) minutes as needed. For chest pain 12/18/11  Yes Laurey Morale, MD  olmesartan (BENICAR) 20 MG tablet Take 1 tablet (20 mg total) by mouth daily. 05/09/12  Yes Scott T Alben Spittle, PA-C  pantoprazole (PROTONIX) 40 MG tablet Take 40 mg by mouth daily.   Yes Historical Provider, MD  potassium chloride (K-DUR) 10 MEQ tablet Take 10 mEq by mouth daily. TAKE 20 MEQ x 3 DAYS THEN STOP; MAY TAKE 10 MEQ PRN WITH LASIX 05/09/12  Yes Scott T Alben Spittle, PA-C  promethazine (PHENERGAN) 25 MG tablet Take 25 mg by mouth every 6 (six) hours as needed for nausea.   Yes Historical Provider, MD  sulfamethoxazole-trimethoprim (BACTRIM DS,SEPTRA DS) 800-160 MG per tablet Take 1 tablet by mouth 2 (two) times daily.   Yes Historical Provider, MD  topiramate (TOPAMAX) 50 MG tablet Take 100 mg by mouth 2 (two) times daily.    Yes Historical Provider, MD  SUMAtriptan (IMITREX) 100 MG tablet Take 100 mg by mouth every 2 (two) hours as needed. For migraines    Historical Provider, MD   Physical Exam: Filed Vitals:   02/10/13 0100  BP: 131/84  Pulse: 76  Temp:   Resp:     General:  NAD, resting comfortably in bed Eyes: PEERLA EOMI ENT: mucous membranes moist Neck: supple w/o JVD Cardiovascular: RRR w/o MRG Respiratory: CTA B Abdomen: soft, mild epigastric tenderness, nd, bs+ Skin: no rash nor lesion Musculoskeletal: MAE, full ROM all 4 extremities Psychiatric: normal tone and affect Neurologic: AAOx3, grossly non-focal  Labs on Admission:  Basic Metabolic Panel:  Recent Labs Lab 02/09/13 1948  NA 137  K 3.9  CL 106  CO2 20  GLUCOSE 93   BUN 11  CREATININE 0.67  CALCIUM 8.9   Liver Function Tests:  Recent Labs Lab 02/09/13 1948  AST 13  ALT 19  ALKPHOS 66  BILITOT 0.2*  PROT 7.2  ALBUMIN 3.6    Recent Labs Lab 02/09/13 2005  LIPASE 34   No results found for this basename: AMMONIA,  in the last 168 hours CBC:  Recent Labs Lab 02/09/13 2005  WBC 5.8  NEUTROABS 2.7  HGB 12.5  HCT 37.3  MCV 82.5  PLT 232   Cardiac Enzymes:  Recent Labs Lab 02/09/13 1939  TROPONINI <0.30    BNP (last 3 results)  Recent  Labs  07/08/12 0944 08/28/12 1813 09/30/12 0929  PROBNP 18.0 29.2 10.0   CBG: No results found for this basename: GLUCAP,  in the last 168 hours  Radiological Exams on Admission: Dg Chest 2 View  02/09/2013   CLINICAL DATA:  Chest pain and shortness of Breath.  EXAM: CHEST  2 VIEW  COMPARISON:  08/28/2012  FINDINGS: Vagal nerve stimulator noted with leads projecting over the left apex, as before.  The lungs appear clear. The cardiac and mediastinal contours normal. No pleural effusion identified.  IMPRESSION: No active cardiopulmonary disease.   Electronically Signed   By: Herbie Baltimore   On: 02/09/2013 20:56    EKG: Independently reviewed.  Assessment/Plan Principal Problem:   Chest pain Active Problems:   GERD (gastroesophageal reflux disease)   1. Chest pain - improved with NTG, no EKG changes, no elevation in troponin.  Will continue to monitor troponin and tele monitor on patient, NTG paste ordered for chest.  Have spoken with Dr. Katha Cabal and he does not feel that this chest pain is cardiac or his service is needed, he does point out that patient has had numerous cardiac work ups all of which have showed no coronary artery disease and most recently a stress test 30 days ago (on 01/09/13) as a result I feel it is reasonable to hold off on giving the patient IV heparin at this point in time.  Will of course start IV heparin if troponins turn positive. 2. GERD - putting patient on  IV PPI bolus and 40mg  IV q12h at this time, have ordered a gastrin level, Dr. Loreta Ave should be called tomorrow AM as apparently he did an EGD on the patient just last month.  May wish to repeat EGD and see if there are esophageal changes that could be indicative of the cause of her chest pain.    Code Status: Full Code (must indicate code status--if unknown or must be presumed, indicate so) Family Communication: No family in room (indicate person spoken with, if applicable, with phone number if by telephone) Disposition Plan: Admit to obs (indicate anticipated LOS)  Time spent: 70 min  GARDNER, JARED M. [facility] Pager [phone]  If 7PM-7AM, please contact night-coverage [email or web address] Password [email or web address] 02/10/2013, 1:39 AM

## 2013-02-12 LAB — GASTRIN: Gastrin: 85 pg/mL (ref <=100)

## 2013-02-13 ENCOUNTER — Other Ambulatory Visit: Payer: Self-pay | Admitting: Gastroenterology

## 2013-02-13 DIAGNOSIS — R110 Nausea: Secondary | ICD-10-CM

## 2013-02-13 DIAGNOSIS — R1013 Epigastric pain: Secondary | ICD-10-CM

## 2013-02-18 NOTE — Discharge Summary (Signed)
Physician Discharge Summary  [name] ZOX:[id number] DOB: [birth year] DOA: 02/09/2013  PCP: Genia Hotter, MD  Admit date: 02/09/2013 Discharge date: 02/10/13  Discharge Diagnoses:  Principal Problem:   Non-cardiac chest pain secondary to GERD Active Problems:   GERD (gastroesophageal reflux disease) obesity  Discharge Condition: stable   Filed Weights   02/09/13 1931 02/10/13 0207  Weight: 109.317 kg (241 lb) 122.698 kg (270 lb 8 oz)    History of present illness:   [name] is a 47 y.o. female who presents to the ED with midline chest pain. Symptoms include midsternal chest pressure with radiation into B neck and shoulders. Pain initially relieved with NTG but returned. The patients cardiac history is significant for an elevated troponin with chest pain occuring last year in [place], troponin peaked at 0.98, however patient had Left and R heart cath at that time which demonstrated completely normal coronary arteries. The patient has since had 2 stress tests both of which were normal and the last of these stress tests was done 1 month ago to the day. Stress test was done as a cardiac clearance for cholecystectomy which she had twords the end of last month. Patient also reports a long history of severe GERD, which despite protonix has remained so severe that she does not sleep lying down (or she winds up with acid in her throat she states).   Hospital Course:  Patient with chronic recurrent chest pain.  Admitted with same.  H/o GERD and recent negative EGD.  Admitted to telemetry.  MI ruled out.  PPI increased to bid and added carafate.  Discussed with Dr. Loreta Ave.  Pt will f/u with her as outpatient  Consultations:  none  Discharge Exam: Filed Vitals:   02/10/13 1400  BP: 109/63  Pulse: 76  Temp: 97.5 F (36.4 C)  Resp: 20    General:  Comfortable. talkative Cardiovascular: RRR without MGR Respiratory: CTA without WRR  Discharge Instructions  Discharge Orders   Future Appointments Provider Department Dept Phone   04/01/2013 11:30 AM Laurey Morale, MD [facility] Main Office [place]) [phone]   Future Orders Complete By Expires   Activity as tolerated - No restrictions  As directed        Medication List    STOP taking these medications       ibuprofen 200 MG tablet  Commonly known as:  ADVIL,MOTRIN      TAKE these medications       budesonide-formoterol 160-4.5 MCG/ACT inhaler  Commonly known as:  SYMBICORT  Inhale 2 puffs into the lungs 2 (two) times daily.     Coenzyme Q10 200 MG Tabs  Take 1 tablet by mouth every evening. Hold while in hospital     docusate sodium 100 MG capsule  Commonly known as:  COLACE  Take 100 mg by mouth 2 (two) times daily.     furosemide 20 MG tablet  Commonly known as:  LASIX  Take 20 mg by mouth daily as needed for edema.     HYDROcodone-acetaminophen 5-325 MG per tablet  Commonly known as:  NORCO  Take 1-2 tablets by mouth every 4 (four) hours as needed for pain.     isosorbide mononitrate 60 MG 24 hr tablet  Commonly known as:  IMDUR  Take 60 mg by mouth 2 (two) times daily.     levETIRAcetam 500 MG tablet  Commonly known as:  KEPPRA  Take 500 mg by mouth every 12 (twelve) hours.  levothyroxine 50 MCG tablet  Commonly known as:  SYNTHROID, LEVOTHROID  Take 50 mcg by mouth daily.     metoprolol tartrate 25 MG tablet  Commonly known as:  LOPRESSOR  Take 12.5 mg by mouth 2 (two) times daily.     nitroGLYCERIN 0.4 MG SL tablet  Commonly known as:  NITROSTAT  Place 0.4 mg under the tongue every 5 (five) minutes as needed. For chest pain     olmesartan 20 MG tablet  Commonly known as:  BENICAR  Take 1 tablet (20 mg total) by mouth daily.     pantoprazole 40 MG tablet  Commonly known as:  PROTONIX  Take 1 tablet (40 mg total) by mouth 2 (two) times daily.     potassium chloride 10 MEQ tablet  Commonly known as:  K-DUR  Take 10 mEq by mouth daily. TAKE 20 MEQ x 3 DAYS  THEN STOP; MAY TAKE 10 MEQ PRN WITH LASIX     promethazine 25 MG tablet  Commonly known as:  PHENERGAN  Take 25 mg by mouth every 6 (six) hours as needed for nausea.     sucralfate 1 GM/10ML suspension  Commonly known as:  CARAFATE  Take 10 mLs (1 g total) by mouth 4 (four) times daily.     sulfamethoxazole-trimethoprim 800-160 MG per tablet  Commonly known as:  BACTRIM DS,SEPTRA DS  Take 1 tablet by mouth 2 (two) times daily.     SUMAtriptan 100 MG tablet  Commonly known as:  IMITREX  Take 100 mg by mouth every 2 (two) hours as needed. For migraines     topiramate 50 MG tablet  Commonly known as:  TOPAMAX  Take 100 mg by mouth 2 (two) times daily.       Allergies  Allergen Reactions  . Amantadines Swelling  . Amitriptyline Swelling  . Amlodipine Swelling  . Aspirin Nausea Only    Irritates stomach also  . Latex Hives, Itching and Swelling    Burning also  . Simvastatin Swelling    Muscle pains also       Follow-up Information   Schedule an appointment as soon as possible for a visit with Abigail Elizabeth, MD.   Specialty:  Gastroenterology   Contact information:   [address], [place] [address] [phone]        The results of significant diagnostics from this hospitalization (including imaging, microbiology, ancillary and laboratory) are listed below for reference.    Significant Diagnostic Studies: Dg Chest 2 View  02/09/2013   CLINICAL DATA:  Chest pain and shortness of Breath.  EXAM: CHEST  2 VIEW  COMPARISON:  08/28/2012  FINDINGS: Vagal nerve stimulator noted with leads projecting over the left apex, as before.  The lungs appear clear. The cardiac and mediastinal contours normal. No pleural effusion identified.  IMPRESSION: No active cardiopulmonary disease.   Electronically Signed   By: Herbie Baltimore   On: 02/09/2013 20:56    Microbiology: Recent Results (from the past 240 hour(s))  MRSA PCR SCREENING     Status: None    Collection Time    02/10/13  2:46 AM      Result Value Range Status   MRSA by PCR NEGATIVE  NEGATIVE Final   Comment:            The GeneXpert MRSA Assay (FDA     approved for NASAL specimens     only), is one component of a     comprehensive MRSA  colonization     surveillance program. It is not     intended to diagnose MRSA     infection nor to guide or     monitor treatment for     MRSA infections.     Labs: Basic Metabolic Panel: No results found for this basename: NA, K, CL, CO2, GLUCOSE, BUN, CREATININE, CALCIUM, MG, PHOS,  in the last 168 hours Liver Function Tests: No results found for this basename: AST, ALT, ALKPHOS, BILITOT, PROT, ALBUMIN,  in the last 168 hours No results found for this basename: LIPASE, AMYLASE,  in the last 168 hours No results found for this basename: AMMONIA,  in the last 168 hours CBC: No results found for this basename: WBC, NEUTROABS, HGB, HCT, MCV, PLT,  in the last 168 hours Cardiac Enzymes: No results found for this basename: CKTOTAL, CKMB, CKMBINDEX, TROPONINI,  in the last 168 hours BNP: BNP (last 3 results)  Recent Labs  07/08/12 0944 08/28/12 1813 09/30/12 0929  PROBNP 18.0 29.2 10.0   CBG: No results found for this basename: GLUCAP,  in the last 168 hours     Signed:  Breion Novacek L  [facility] 02/18/2013, 10:42 PM

## 2013-03-05 ENCOUNTER — Encounter (HOSPITAL_COMMUNITY)
Admission: RE | Admit: 2013-03-05 | Discharge: 2013-03-05 | Disposition: A | Discharge status: Home / Self Care | Payer: Medicare Other | Source: Ambulatory Visit | Attending: Gastroenterology | Admitting: Gastroenterology

## 2013-03-05 DIAGNOSIS — R1013 Epigastric pain: Secondary | ICD-10-CM | POA: Insufficient documentation

## 2013-03-05 DIAGNOSIS — R110 Nausea: Secondary | ICD-10-CM | POA: Insufficient documentation

## 2013-03-05 MED ORDER — TECHNETIUM TC 99M SULFUR COLLOID
2.0000 | Freq: Once | INTRAVENOUS | Status: AC | PRN
  Administered 2013-03-05: 2 via ORAL

## 2013-04-01 ENCOUNTER — Other Ambulatory Visit: Payer: Self-pay | Admitting: Neurology

## 2013-04-01 ENCOUNTER — Ambulatory Visit: Payer: Medicare Other | Admitting: Cardiology

## 2013-04-03 ENCOUNTER — Other Ambulatory Visit: Payer: Self-pay | Admitting: Cardiology

## 2013-04-29 ENCOUNTER — Other Ambulatory Visit: Payer: Self-pay

## 2013-04-29 DIAGNOSIS — R079 Chest pain, unspecified: Secondary | ICD-10-CM

## 2013-04-29 MED ORDER — FUROSEMIDE 20 MG PO TABS: 20.0000 mg | ORAL_TABLET | Freq: Every day | ORAL | Status: DC | PRN

## 2013-05-06 ENCOUNTER — Other Ambulatory Visit: Payer: Self-pay | Admitting: Gastroenterology

## 2013-05-06 DIAGNOSIS — R1310 Dysphagia, unspecified: Secondary | ICD-10-CM

## 2013-05-08 ENCOUNTER — Ambulatory Visit
Admission: RE | Admit: 2013-05-08 | Discharge: 2013-05-08 | Disposition: A | Discharge status: Home / Self Care | Payer: Medicare Other | Source: Ambulatory Visit | Attending: Gastroenterology | Admitting: Gastroenterology

## 2013-05-08 ENCOUNTER — Encounter: Payer: Self-pay | Admitting: Cardiology

## 2013-05-08 DIAGNOSIS — R1310 Dysphagia, unspecified: Secondary | ICD-10-CM

## 2013-05-15 ENCOUNTER — Other Ambulatory Visit: Payer: Self-pay | Admitting: Physician Assistant

## 2013-06-07 ENCOUNTER — Other Ambulatory Visit: Payer: Self-pay | Admitting: Cardiology

## 2013-06-13 ENCOUNTER — Telehealth: Payer: Self-pay | Admitting: Cardiology

## 2013-06-13 NOTE — Telephone Encounter (Signed)
ROI Mailed to PT

## 2013-06-16 ENCOUNTER — Telehealth: Payer: Self-pay | Admitting: Cardiology

## 2013-06-16 ENCOUNTER — Encounter (HOSPITAL_COMMUNITY): Payer: Self-pay | Admitting: Emergency Medicine

## 2013-06-16 ENCOUNTER — Emergency Department (HOSPITAL_COMMUNITY)
Admission: EM | Admit: 2013-06-16 | Discharge: 2013-06-16 | Discharge status: Against Medical Advice | Payer: Medicare Other | Attending: Emergency Medicine | Admitting: Emergency Medicine

## 2013-06-16 ENCOUNTER — Emergency Department (HOSPITAL_COMMUNITY): Payer: Medicare Other

## 2013-06-16 DIAGNOSIS — I10 Essential (primary) hypertension: Secondary | ICD-10-CM | POA: Insufficient documentation

## 2013-06-16 DIAGNOSIS — J45909 Unspecified asthma, uncomplicated: Secondary | ICD-10-CM | POA: Insufficient documentation

## 2013-06-16 DIAGNOSIS — R61 Generalized hyperhidrosis: Secondary | ICD-10-CM | POA: Insufficient documentation

## 2013-06-16 DIAGNOSIS — R0789 Other chest pain: Secondary | ICD-10-CM | POA: Insufficient documentation

## 2013-06-16 DIAGNOSIS — R112 Nausea with vomiting, unspecified: Secondary | ICD-10-CM | POA: Insufficient documentation

## 2013-06-16 DIAGNOSIS — R079 Chest pain, unspecified: Secondary | ICD-10-CM

## 2013-06-16 DIAGNOSIS — IMO0002 Reserved for concepts with insufficient information to code with codable children: Secondary | ICD-10-CM | POA: Insufficient documentation

## 2013-06-16 DIAGNOSIS — G43909 Migraine, unspecified, not intractable, without status migrainosus: Secondary | ICD-10-CM | POA: Insufficient documentation

## 2013-06-16 DIAGNOSIS — I2510 Atherosclerotic heart disease of native coronary artery without angina pectoris: Secondary | ICD-10-CM | POA: Insufficient documentation

## 2013-06-16 DIAGNOSIS — G40909 Epilepsy, unspecified, not intractable, without status epilepticus: Secondary | ICD-10-CM | POA: Insufficient documentation

## 2013-06-16 DIAGNOSIS — E039 Hypothyroidism, unspecified: Secondary | ICD-10-CM | POA: Insufficient documentation

## 2013-06-16 DIAGNOSIS — R0602 Shortness of breath: Secondary | ICD-10-CM | POA: Insufficient documentation

## 2013-06-16 DIAGNOSIS — I252 Old myocardial infarction: Secondary | ICD-10-CM | POA: Insufficient documentation

## 2013-06-16 DIAGNOSIS — K219 Gastro-esophageal reflux disease without esophagitis: Secondary | ICD-10-CM | POA: Insufficient documentation

## 2013-06-16 DIAGNOSIS — Z79899 Other long term (current) drug therapy: Secondary | ICD-10-CM | POA: Insufficient documentation

## 2013-06-16 DIAGNOSIS — E669 Obesity, unspecified: Secondary | ICD-10-CM | POA: Insufficient documentation

## 2013-06-16 LAB — POCT I-STAT TROPONIN I
Troponin i, poc: 0.01 ng/mL (ref 0.00–0.08)
Troponin i, poc: 0.01 ng/mL (ref 0.00–0.08)

## 2013-06-16 LAB — BASIC METABOLIC PANEL
BUN: 10 mg/dL (ref 6–23)
CO2: 22 mEq/L (ref 19–32)
Calcium: 8.8 mg/dL (ref 8.4–10.5)
Chloride: 111 mEq/L (ref 96–112)
Creatinine, Ser: 0.74 mg/dL (ref 0.50–1.10)
GFR calc Af Amer: >90 mL/min (ref >90)
GFR calc non Af Amer: >90 mL/min (ref >90)
Glucose, Bld: 87 mg/dL (ref 70–99)
Potassium: 4.1 mEq/L (ref 3.7–5.3)
Sodium: 143 mEq/L (ref 137–147)

## 2013-06-16 LAB — CBC
HCT: 35.6 % — ABNORMAL LOW (ref 36.0–46.0)
Hemoglobin: 11.8 g/dL — ABNORMAL LOW (ref 12.0–15.0)
MCH: 27.8 pg (ref 26.0–34.0)
MCHC: 33.1 g/dL (ref 30.0–36.0)
MCV: 83.8 fL (ref 78.0–100.0)
Platelets: 187 10*3/uL (ref 150–400)
RBC: 4.25 MIL/uL (ref 3.87–5.11)
RDW: 14 % (ref 11.5–15.5)
WBC: 5.9 10*3/uL (ref 4.0–10.5)

## 2013-06-16 LAB — POCT PREGNANCY, URINE: Preg Test, Ur: NEGATIVE

## 2013-06-16 MED ORDER — ONDANSETRON 4 MG PO TBDP
4.0000 mg | ORAL_TABLET | Freq: Once | ORAL | Status: AC
Start: 2013-06-16 — End: 2013-06-16
  Administered 2013-06-16: 4 mg via ORAL
  Filled 2013-06-16: qty 1

## 2013-06-16 MED ORDER — PANTOPRAZOLE SODIUM 40 MG PO TBEC
40.0000 mg | DELAYED_RELEASE_TABLET | Freq: Once | ORAL | Status: AC
  Administered 2013-06-16: 40 mg via ORAL
  Filled 2013-06-16: qty 1

## 2013-06-16 MED ORDER — GI COCKTAIL ~~LOC~~
30.0000 mL | Freq: Once | ORAL | Status: AC
  Administered 2013-06-16: 30 mL via ORAL
  Filled 2013-06-16: qty 30

## 2013-06-16 MED ORDER — MORPHINE SULFATE 4 MG/ML IJ SOLN
4.0000 mg | Freq: Once | INTRAMUSCULAR | Status: AC
  Administered 2013-06-16: 4 mg via INTRAMUSCULAR
  Filled 2013-06-16: qty 1

## 2013-06-16 MED ORDER — ONDANSETRON 4 MG PO TBDP
8.0000 mg | ORAL_TABLET | Freq: Once | ORAL | Status: DC
  Filled 2013-06-16: qty 2

## 2013-06-16 NOTE — Discharge Instructions (Signed)
Chest Pain (Nonspecific) °It is often hard to give a specific diagnosis for the cause of chest pain. There is always a chance that your pain could be related to something serious, such as a heart attack or a blood clot in the lungs. You need to follow up with your caregiver for further evaluation. °CAUSES  °· Heartburn. °· Pneumonia or bronchitis. °· Anxiety or stress. °· Inflammation around your heart (pericarditis) or lung (pleuritis or pleurisy). °· A blood clot in the lung. °· A collapsed lung (pneumothorax). It can develop suddenly on its own (spontaneous pneumothorax) or from injury (trauma) to the chest. °· Shingles infection (herpes zoster virus). °The chest wall is composed of bones, muscles, and cartilage. Any of these can be the source of the pain. °· The bones can be bruised by injury. °· The muscles or cartilage can be strained by coughing or overwork. °· The cartilage can be affected by inflammation and become sore (costochondritis). °DIAGNOSIS  °Lab tests or other studies, such as X-rays, electrocardiography, stress testing, or cardiac imaging, may be needed to find the cause of your pain.  °TREATMENT  °· Treatment depends on what may be causing your chest pain. Treatment may include: °· Acid blockers for heartburn. °· Anti-inflammatory medicine. °· Pain medicine for inflammatory conditions. °· Antibiotics if an infection is present. °· You may be advised to change lifestyle habits. This includes stopping smoking and avoiding alcohol, caffeine, and chocolate. °· You may be advised to keep your head raised (elevated) when sleeping. This reduces the chance of acid going backward from your stomach into your esophagus. °· Most of the time, nonspecific chest pain will improve within 2 to 3 days with rest and mild pain medicine. °HOME CARE INSTRUCTIONS  °· If antibiotics were prescribed, take your antibiotics as directed. Finish them even if you start to feel better. °· For the next few days, avoid physical  activities that bring on chest pain. Continue physical activities as directed. °· Do not smoke. °· Avoid drinking alcohol. °· Only take over-the-counter or prescription medicine for pain, discomfort, or fever as directed by your caregiver. °· Follow your caregiver's suggestions for further testing if your chest pain does not go away. °· Keep any follow-up appointments you made. If you do not go to an appointment, you could develop lasting (chronic) problems with pain. If there is any problem keeping an appointment, you must call to reschedule. °SEEK MEDICAL CARE IF:  °· You think you are having problems from the medicine you are taking. Read your medicine instructions carefully. °· Your chest pain does not go away, even after treatment. °· You develop a rash with blisters on your chest. °SEEK IMMEDIATE MEDICAL CARE IF:  °· You have increased chest pain or pain that spreads to your arm, neck, jaw, back, or abdomen. °· You develop shortness of breath, an increasing cough, or you are coughing up blood. °· You have severe back or abdominal pain, feel nauseous, or vomit. °· You develop severe weakness, fainting, or chills. °· You have a fever. °THIS IS AN EMERGENCY. Do not wait to see if the pain will go away. Get medical help at once. Call your local emergency services (911 in [place] Do not drive yourself to the hospital. °MAKE SURE YOU:  °· Understand these instructions. °· Will watch your condition. °· Will get help right away if you are not doing well or get worse. °Document Released: 03/01/2005 Document Revised: 08/14/2011 Document Reviewed: 12/26/2007 °ExitCare® Patient Information ©2014 [employer]   [employer].   You were seen in the emergency department for chest pain. Your labs, EKG and chest x-ray showed no abnormalities but given your history of cardiac disease and her concerning story, we recommend that she stay in the emergency department to be seen by cardiology. You've decided to leave the emergency department  [place]. We are unable to tell if you have a life-threatening illness without further evaluation and strongly recommend that you stay. There are risks with leaving the hospital [place]. You may have worsening symptoms, life-threatening illness that may lead to significant disability or death. If you have continued symptoms, worsening chest pain or shortness of breath, feel like you're going to pass out or do passout, began to suddenly sweat, please return to the emergency department immediately. I recommend that you contact your cardiologist office today for close outpatient followup.

## 2013-06-16 NOTE — ED Notes (Signed)
Pt requesting to leave due to not having anyone to pick up her daughter. Dr. Leonides Schanz notified and went in to speak with pt.

## 2013-06-16 NOTE — ED Notes (Signed)
Pt states she has MI history 2 years ago and started having chest pain last nite with nausea and vomiting.  Pt took nitro times 5 yesterday.  Pt sts took nitro this am but it does not help.

## 2013-06-16 NOTE — ED Notes (Signed)
Patient transported to X-ray 

## 2013-06-16 NOTE — ED Provider Notes (Signed)
TIME SEEN: 11:23 AM  CHIEF COMPLAINT: Chest pain, vomiting  HPI: Patient is a 48 year old female with history of hypertension, migraines, seizures, hypothyroidism, prior MI in [place] 2 years ago who presents to the emergency department with chest pain that she describes as a diffuse pressure that radiates into her neck and upper back that started at 3:30 PM yesterday and has been constant. Patient states she did have some shortness of breath, diaphoresis, nausea and vomiting with this. She states that her pain came on at rest. She reports it is worse with exertion. She does not notice any change with food. Per hospital records, patient had a cardiac catheterization in 2013 showed no significant cardiac disease. She's also had a recent stress test in August 2014 that has been negative. She is followed by Dr. [name] with cardiology. She is also followed by gastroenterology, Dr. Collene Mares, and is on PPI, hyoscyamine for severe acid reflux. Patient is status post cholecystectomy. She denies any recent surgeries, traumas, fractures, prolonged immobilization such as hospitalization or long flight, lower extremity swelling or pain, prior PE or DVT, exogenous hormone use or tobacco use. She does not have a history of diabetes, high cholesterol. She does have a mother and grandmother who have coronary artery disease. Patient denies any fever or cough.  She states that she has taken multiple nitroglycerin and hyoscyamine without relief. She states she is unable to tell if this feels like her prior cardiac pain or her severe GERD.  ROS: See HPI Constitutional: no fever  Eyes: no drainage  ENT: no runny nose   Cardiovascular:   chest pain  Resp:  SOB  GI:  vomiting GU: no dysuria Integumentary: no rash  Allergy: no hives  Musculoskeletal: no leg swelling  Neurological: no slurred speech ROS otherwise negative  PAST MEDICAL HISTORY/PAST SURGICAL HISTORY:  Past Medical History  Diagnosis Date  . HTN  (hypertension)   . Seizure disorder     since childhood.  . Chest pain 05/15/11, 05/16/11  . Migraine   . Asthma   . Hypothyroid   . GERD (gastroesophageal reflux disease)   . Obesity   . MI (myocardial infarction)   . Complication of anesthesia     hard to wake up-had to be reminded to breath  . Hx of cardiovascular stress test     Lex Myoview 8/14:  Normal, EF 74%  . Coronary artery disease     MEDICATIONS:  Prior to Admission medications   Medication Sig Start Date End Date Taking? Authorizing Provider  BENICAR 20 MG tablet TAKE 1 TABLET (20 MG TOTAL) BY MOUTH DAILY. 05/15/13   Liliane Shi, PA-C  budesonide-formoterol (SYMBICORT) 160-4.5 MCG/ACT inhaler Inhale 2 puffs into the lungs 2 (two) times daily.    Historical Provider, MD  Coenzyme Q10 200 MG TABS Take 1 tablet by mouth every evening. Hold while in hospital 02/07/12   Larey Dresser, MD  docusate sodium (COLACE) 100 MG capsule Take 100 mg by mouth 2 (two) times daily.    Historical Provider, MD  furosemide (LASIX) 20 MG tablet Take 1 tablet (20 mg total) by mouth daily as needed for edema. 04/29/13   Larey Dresser, MD  HYDROcodone-acetaminophen (NORCO) 5-325 MG per tablet Take 1-2 tablets by mouth every 4 (four) hours as needed for pain. 01/24/13   Harl Bowie, MD  isosorbide mononitrate (IMDUR) 60 MG 24 hr tablet Take 60 mg by mouth 2 (two) times daily.    Historical Provider, MD  levETIRAcetam (KEPPRA) 500 MG tablet TAKE 1 TABLET TWICE A DAY 04/01/13   Marcial Pacas, MD  levothyroxine (SYNTHROID, LEVOTHROID) 50 MCG tablet Take 50 mcg by mouth daily.     Historical Provider, MD  metoprolol tartrate (LOPRESSOR) 25 MG tablet Take 12.5 mg by mouth 2 (two) times daily.     Historical Provider, MD  NITROSTAT 0.4 MG SL tablet PLACE 1 TABLET (0.4 MG TOTAL) UNDER THE TONGUE EVERY 5 (FIVE) MINUTES AS NEEDED. 04/03/13   Larey Dresser, MD  pantoprazole (PROTONIX) 40 MG tablet Take 1 tablet (40 mg total) by mouth 2 (two) times  daily. 02/10/13   Delfina Redwood, MD  potassium chloride (K-DUR) 10 MEQ tablet Take 10 mEq by mouth daily. TAKE 20 MEQ x 3 DAYS THEN STOP; MAY TAKE 10 MEQ PRN WITH LASIX 05/09/12   Liliane Shi, PA-C  promethazine (PHENERGAN) 25 MG tablet Take 25 mg by mouth every 6 (six) hours as needed for nausea.    Historical Provider, MD  sucralfate (CARAFATE) 1 GM/10ML suspension Take 10 mLs (1 g total) by mouth 4 (four) times daily. 02/10/13   Delfina Redwood, MD  sulfamethoxazole-trimethoprim (BACTRIM DS,SEPTRA DS) 800-160 MG per tablet Take 1 tablet by mouth 2 (two) times daily.    Historical Provider, MD  SUMAtriptan (IMITREX) 100 MG tablet Take 100 mg by mouth every 2 (two) hours as needed. For migraines    Historical Provider, MD  topiramate (TOPAMAX) 50 MG tablet Take 100 mg by mouth 2 (two) times daily.     Historical Provider, MD    ALLERGIES:  Allergies  Allergen Reactions  . Amantadines Swelling  . Amitriptyline Swelling  . Amlodipine Swelling  . Aspirin Nausea Only    Irritates stomach also  . Latex Hives, Itching and Swelling    Burning also  . Simvastatin Swelling    Muscle pains also    SOCIAL HISTORY:  History  Substance Use Topics  . Smoking status: Never Smoker   . Smokeless tobacco: Never Used  . Alcohol Use: 0.0 oz/week    1-3 Glasses of wine per week     Comment: occasional    FAMILY HISTORY: Family History  Problem Relation Age of Onset  . Coronary artery disease Mother 74  . Coronary artery disease Maternal Grandmother   . Cancer Other   . Hypertension Other   . Stroke Other     EXAM: BP 121/75  Pulse 87  Temp(Src) 97.7 F (36.5 C) (Oral)  Resp 20  Wt 234 lb (106.142 kg)  SpO2 99% CONSTITUTIONAL: Alert and oriented and responds appropriately to questions. Well-appearing; well-nourished HEAD: Normocephalic EYES: Conjunctivae clear, PERRL ENT: normal nose; no rhinorrhea; moist mucous membranes; pharynx without lesions noted NECK: Supple, no  meningismus, no LAD  CARD: RRR; S1 and S2 appreciated; no murmurs, no clicks, no rubs, no gallops RESP: Normal chest excursion without splinting or tachypnea; breath sounds clear and equal bilaterally; no wheezes, no rhonchi, no rales,  ABD/GI: Normal bowel sounds; non-distended; soft, non-tender, no rebound, no guarding BACK:  The back appears normal and is non-tender to palpation, there is no CVA tenderness EXT: Normal ROM in all joints; non-tender to palpation; no edema; normal capillary refill; no cyanosis    SKIN: Normal color for age and race; warm NEURO: Moves all extremities equally PSYCH: The patient's mood and manner are appropriate. Grooming and personal hygiene are appropriate.  MEDICAL DECISION MAKING: Patient here with constant chest pain since 3:30 PM yesterday. She  is hemodynamically stable. Her EKG shows no ischemic changes. We'll obtain cardiac labs. Will also give GI cocktail, PPI, Zofran and morphine and reassess. Suspect her symptoms are more likely related to acid reflux. If workup is unremarkable, we'll discuss with patient's cardiologist for disposition.  ED PROGRESS: Patient's labs are reassuring. Her troponin is negative. Chest x-ray shows no infiltrate, edema, pneumothorax. Patient reports her pain improved with morphine, GI cocktail, PPI and Zofran. She states however when she gets up to walk, her chest pain gets worse. Will discuss with patient's cardiologist for disposition.  2:23 PM  D/w Wannetta Sender PA with [facility] cardiology.  They will see pt in ED.  3:21 PM  Pt reports she needs to leave the emergency department to pick up her autistic daughter. She states she does not have anyone who can pick her daughter up from school today. She is still having chest pain and I have strongly recommended that she stay in the emergency department for further evaluation and to be seen by cardiology. Patient states she cannot stay and is planning to leave the emergency department against  medical device. She understands that her symptoms may continue to worsen and she could have a life-threatening process that could lead to significant disability and death. Patient is competent to make this decision and has verbalize these risks back to me. Will have patient sign out AMA. Have given strict return precautions. Have asked her to followup with her cardiologist today.   EKG Interpretation    Date/Time:  Monday June 16 2013 11:10:31 EST Ventricular Rate:  92 PR Interval:  120 QRS Duration: 90 QT Interval:  350 QTC Calculation: 432 R Axis:   72 Text Interpretation:  Normal sinus rhythm Nonspecific T wave abnormality Abnormal ECG No significant change since last tracing Confirmed by Giada Schoppe  DO, Javid Kemler ([id number]) on 06/16/2013 11:22:38 AM             [place], DO 06/16/13 [id number]

## 2013-06-16 NOTE — Telephone Encounter (Signed)
New Message  Pt called states that she had to leave the hospital for personal reasons.. However they advised her to follow Up with Dr. [name] for next day appt ( No appt with PA or Mclean at this time) or Go back to the hospital// Pt states she has had severe chest pains and requests to see Dr. Mclean// Please call back to discuss.

## 2013-06-16 NOTE — Telephone Encounter (Signed)
Spoke with patient. Her symptoms are unchanged from ED visit earlier today, she declines to report back to ED tonight. Advised pt I was concerned about her symptoms and waiting until tomorrow for further evaluation. She states she does not have family support.  I have scheduled her to see Dr [name] tomorrow at 8:45AM.

## 2013-06-17 ENCOUNTER — Ambulatory Visit: Payer: Medicare Other | Admitting: Cardiology

## 2013-07-02 ENCOUNTER — Ambulatory Visit (INDEPENDENT_AMBULATORY_CARE_PROVIDER_SITE_OTHER): Payer: Medicare Other | Admitting: Physician Assistant

## 2013-07-02 ENCOUNTER — Encounter: Payer: Self-pay | Admitting: Physician Assistant

## 2013-07-02 VITALS — BP 136/94 | HR 79 | Ht 65.5 in | Wt 238.8 lb

## 2013-07-02 DIAGNOSIS — K219 Gastro-esophageal reflux disease without esophagitis: Secondary | ICD-10-CM

## 2013-07-02 DIAGNOSIS — K920 Hematemesis: Secondary | ICD-10-CM

## 2013-07-02 DIAGNOSIS — R634 Abnormal weight loss: Secondary | ICD-10-CM

## 2013-07-02 DIAGNOSIS — G8929 Other chronic pain: Secondary | ICD-10-CM | POA: Insufficient documentation

## 2013-07-02 DIAGNOSIS — E039 Hypothyroidism, unspecified: Secondary | ICD-10-CM

## 2013-07-02 DIAGNOSIS — R079 Chest pain, unspecified: Secondary | ICD-10-CM | POA: Insufficient documentation

## 2013-07-02 HISTORY — DX: Hypothyroidism, unspecified: E03.9

## 2013-07-02 LAB — TROPONIN I: Troponin I: 0.01 ng/mL (ref <0.06)

## 2013-07-02 MED ORDER — NITROGLYCERIN 0.4 MG SL SUBL: 0.4000 mg | SUBLINGUAL_TABLET | SUBLINGUAL | Status: DC | PRN

## 2013-07-02 NOTE — Patient Instructions (Signed)
Your physician recommends that you continue on your current medications as directed. Please refer to the Current Medication list given to you today.  Your physician recommends that you have labs today: TSH, Free T4, Troponin, CK  *You have been referred to Gastroenterology  Your physician recommends that you schedule a follow-up appointment in: 1 month with Dr. [name].

## 2013-07-02 NOTE — Progress Notes (Signed)
[place], [place], [place]  [address] Phone: [phone] Fax:  [phone]  Date:  07/02/2013   Patient ID:  [name] 20-Jul-1965, MRN [id number]   PCP:  Kemper Durie, MD  Cardiologist:  [name]   History of Present Illness: [name] is a 48 y.o. 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 She reports symptoms of chest discomfort, vomiting (low volume red blood), and 35lb weight loss.  Her history of chronic chest pain is complex spanning the past several years. She had an abnormal stress test in [place] in 2008. She was then sent for a cardiac cath, which she says was normal. She underwent ETT-myoview in this office in 11/12 with ischemic-type ST depression on exercise ECG, but there was no evidence for ischemia on myoview images, EF 66%. Given ongoing chest pain and ischemic ECG changes with normal perfusion images (equivocal study), she was set up for coronary CT angiogram. This was a difficult study because we were not able to get her HR as low as would be ideal. However, she had no coronary calcium and no definite stenosis was seen (though mid-RCA was not fully evaluated due to artifact). Dr. [name] felt that this was a negative study. There were no significant extracardiac findings. She continued to have chest tightness with walking long distances, climbing a flight of steps, and housework. In 5/13, she went to the ER in [place] with particularly bad exertional chest pain that lasted a prolonged time. Troponin was mildly elevated with normal CKMB. She had a left heart cath in the hospital in [place] which showed no angiographic CAD, echo w/ normal EF. She was sent home on Imdur and amlodipine. Echo showed normal EF. 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 She stopped the amlodipine due to ankle swelling. Her Ranexa was later stopped while she was in the hospital for treatment of surgical site MRSA infection after she had her vagal nerve stimulator replaced at [facility]. She had required PICC line placed for antibiotics. She did not notice much change in her chest pain pattern off ranolazine. She had significant right arm pain since the PICC was removed and had an [place] that did not show a DVT. She also stopped her statin due to muscle pain. Her CPK level has been mildly elevated in the past. She remains on Bactrim for MRSA at the direction of her [facility] team.  She was seen by Dr. [name] 09/2012 for exertional dyspnea and 5lb weight gain. BNP was normal; she received some relief on Lasix, echo 09/30/12: Mild LVH, EF 55-65%, and GR 2 DD, trivial AI. Negative EGD 11/2012. She was evaluated for pre-op cholecystectomy (biliary dyskinesia) in 01/2013 with a normal Lexiscan nuclear stress test, EF 74%. Cholecystectomy post-op course was uneventful. Admitted 02/2013 with CP felt due to severe GERD (negative d-dimer, negative troponins). Barium swallow 05/2013 with + significant GERD.  She has been having frequent chest discomfort and spasm sensation in her upper chest. She went to the ER the afternoon of 06/16/13 after having chest pain essentially constant for 2 weeks. She had gotten some relief just prior to that visit but then pain returned 3:30pm the day before presentation. Despite ~20 hours of constant chest discomfort, troponin was negative x 2. (Note: this episode  was stereotypical of the symptoms she has been experiencing as descibed below.)  She denied any recent traumas, fractures, prolonged travel, lower extremity swelling or pain, prior PE or DVT. She unfortunately had to leave AMA to pick up her autistic daughter from school.   Since that time she has had this chest discomfort just about every other day, sometimes it lasting  up to all day and into the following day. There is a marked association with meals. If she has eaten that day, several hours later she experiences significant chest discomfort with throat discomfort. This is worse with any type of movement (leaning, twisting, shifting, walking). It is relieved with a combination of SL NTG (sometimes multiples), as well as muscle relaxers and hydrocodone. Sometimes it finally goes away after she feels that she's digested all her food.  It has gotten to the point where she frequently avoids eating which reduces the frequency and severity of CP. This pain is associated with nausea and vomiting. For months she has had low volume hematemesis each time she vomits.She has bouts of fluctuating diarrhea as well. She has tried milk of mag to "empty out her system" to prevent symptoms without any relief (has had diarrhea in absence of this as well). She has lost approximately 35 lbs since September unintentionally - this is corroborated in [place]. She denies any intentional ideation to vomit or abuse laxatives. No significant palpitations. She has chronic dyspnea and chest pressure occasionally with walking that has not significantly changed recently. She states she saw her GI Dr. Collene Mares who added carafate and titrated Protonix since barium swallow in December showed severe GERD, but this provided no effect. Denies BRBPR, melena, other unusual bleeding, syncope, LEE, fevers.  Recent Labs: 09/30/2012: HDL Cholesterol 51.30; LDL (calc) 104*; Pro B Natriuretic peptide (BNP) 10.0  02/09/2013: ALT 19  06/16/2013: Creatinine 0.74; Hemoglobin 11.8*; Potassium 4.1  Wt Readings from Last 3 Encounters:  07/02/13 238 lb 12.8 oz (108.319 kg)  06/16/13 234 lb (106.142 kg)  02/10/13 270 lb 8 oz (122.698 kg)     Past Medical History  Diagnosis Date  . HTN (hypertension)   . Seizure disorder     a. since childhood. b. s/p vagal nerve stimulator at [facility].  . Migraine   . Asthma   . Hypothyroid     . Obesity   . MI (myocardial infarction)   . Complication of anesthesia     hard to wake up-had to be reminded to breath  . Hx of cardiovascular stress test     Lex Myoview 8/14:  Normal, EF 74%  . Chronic chest pain     a. Abnl stress [place] 2008, f/u cath reportedly nl. b. ETT-Myoview 04/2011 - EKG changes but normal perfusion. Cor CT - no coronary calcium, no definite stenosis though mRCA not fully evaluated. c. 10/2011 - tn elevated in [place], LHC without CAD. Started on Ranexa, anti-anginals ?microvascular dz but later stopped while in hospital on abx.  Marland Kitchen MRSA infection     a. After vagal nerve stimulator at [facility] - surgical site MRSA infection, PICC placed.  . Biliary dyskinesia     a. s/p cholecystectomy.  Marland Kitchen GERD (gastroesophageal reflux disease)     a. Severe.    Current Outpatient Prescriptions  Medication Sig Dispense Refill  . aspirin EC 81 MG tablet Take 81 mg by mouth daily.      . budesonide-formoterol (SYMBICORT) 160-4.5 MCG/ACT inhaler Inhale 2 puffs into the lungs 2 (two) times daily.      Marland Kitchen  Coenzyme Q10 200 MG TABS Take 1 tablet by mouth every evening.     0  . Cyanocobalamin (VITAMIN B-12 IJ) Inject 1,000 mcg as directed every 28 (twenty-eight) days.      Marland Kitchen docusate sodium (COLACE) 100 MG capsule Take 200 mg by mouth 2 (two) times daily as needed (constipation).       . furosemide (LASIX) 20 MG tablet Take 1 tablet (20 mg total) by mouth daily as needed for edema.  30 tablet  2  . HYDROcodone-acetaminophen (NORCO) 5-325 MG per tablet Take 1-2 tablets by mouth every 4 (four) hours as needed for pain.  30 tablet  1  . hyoscyamine (ANASPAZ) 0.125 MG TBDP disintergrating tablet Place 0.125-0.375 mg under the tongue See admin instructions. Take 1 to 3 tablets as needed for spasms      . isosorbide mononitrate (IMDUR) 60 MG 24 hr tablet Take 60 mg by mouth 2 (two) times daily.      Marland Kitchen levETIRAcetam (KEPPRA) 500 MG tablet Take 500-1,000 mg by mouth 2 (two) times daily.  1000mg  in the morning and 500mg  in the evening      . levothyroxine (SYNTHROID, LEVOTHROID) 50 MCG tablet Take 50 mcg by mouth daily.       . metoprolol succinate (TOPROL-XL) 25 MG 24 hr tablet Take 12.5 mg by mouth 2 (two) times daily.      . nitroGLYCERIN (NITROSTAT) 0.4 MG SL tablet Place 1 tablet (0.4 mg total) under the tongue every 5 (five) minutes as needed for chest pain.  30 tablet  3  . olmesartan (BENICAR) 20 MG tablet Take 20 mg by mouth daily.      . pantoprazole (PROTONIX) 40 MG tablet Take 40 mg by mouth 2 (two) times daily.      . potassium chloride (K-DUR) 10 MEQ tablet Take 10 mEq by mouth as needed (takes with lasix).       . promethazine (PHENERGAN) 12.5 MG tablet Take 12.5 mg by mouth every 6 (six) hours as needed for nausea.      . Sucralfate (CARAFATE PO) Take 1 tablet by mouth daily.      . Sucralfate (CARAFATE PO) Take by mouth daily.      Marland Kitchen sulfamethoxazole-trimethoprim (BACTRIM DS,SEPTRA DS) 800-160 MG per tablet Take 1 tablet by mouth 2 (two) times daily.      . SUMAtriptan (IMITREX) 100 MG tablet Take 100 mg by mouth every 2 (two) hours as needed. For migraines      . topiramate (TOPAMAX) 100 MG tablet Take 200 mg by mouth 2 (two) times daily.       No current facility-administered medications for this visit.    Allergies:   Amantadines; Amitriptyline; Amlodipine; Aspirin; Latex; and Simvastatin   Social History:  The patient  reports that she has never smoked. She has never used smokeless tobacco. She reports that she drinks alcohol. She reports that she does not use illicit drugs.   Family History:  The patient's family history includes Cancer in her other; Coronary artery disease in her maternal grandmother; Coronary artery disease (age of onset: 30) in her mother; Hypertension in her other; Stroke in her other.   ROS:  Please see the history of present illness.All other systems reviewed and negative.   PHYSICAL EXAM:  VS:  BP 136/94  Pulse 79  Ht 5' 5.5"  (1.664 m)  Wt 238 lb 12.8 oz (108.319 kg)  BMI 39.12 kg/m2 Well nourished, well developed overweight AAF in no acute  distress, smiling/laughing with staff, in no acute distress HEENT: normal Neck: no JVD Cardiac:  normal S1, S2; RRR; no murmur Lungs:  clear to auscultation bilaterally, no wheezing, rhonchi or rales Abd: soft, nontender, no hepatomegaly Ext: no edema Skin: warm and dry Neuro:  moves all extremities spontaneously, no focal abnormalities noted  EKG:  NSR 84bpm nonspecific ST sagging II, III, avF, minimally in V3-V6 similar to prior tracings     ASSESSMENT AND PLAN:  1. Chest discomfort - symptoms very atypical for cardiac pain. Despite >20 hours of this pain earlier this month, troponins remained negative. She describes a spasm type sensation in her esophagus, has history of severe GERD, and also has symptoms of nausea, hematemesis and weight loss. Nuclear study was negative in 01/2013. D/w Dr. Radford Pax, DOD. EKG nonspecific changes but generally unchanged from prior. Doubt cardiac etiology. Will check a troponin - if this were cardiac, would expect there to be some degree of elevation. Will also check CK given history of unexplained abnormal level to assess for inflammation. I would like her to get a second opinion from GI for a fresh set of eyes to evaluate her. I am not sure if she needs further imaging or hormonal testing for structural abnormalities or unusual problems like Zollinger-Ellison. Her current regimen is not controlling sx. 2. Nausea/hematemesis/weight loss - concerning for GI etiology, as above. 3. 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 No longer on amlodipine due to edema and not on Ranexa due to lack of efficacy.  Given that low volume hematemesis is stable and Hgb has been stable over several months, will continue ASA and BB for now. Not on statin due to h/o CK elevation. 4. Hypothyroidism - check TSH, free T4 to see if there  is any contribution to her weight loss.  Dispo: GI referral ASAP. F/u [name] 1 month  Signed, Melina Copa, PA-C  07/02/2013 5:31 PM

## 2013-07-03 ENCOUNTER — Telehealth: Payer: Self-pay | Admitting: *Deleted

## 2013-07-03 LAB — T4, FREE: Free T4: 0.67 ng/dL (ref 0.60–1.60)

## 2013-07-03 LAB — CK: Total CK: 186 U/L — ABNORMAL HIGH (ref 7–177)

## 2013-07-03 LAB — TSH: TSH: 2.91 u[IU]/mL (ref 0.35–5.50)

## 2013-07-03 NOTE — Telephone Encounter (Signed)
ptcb and has been notified about lab results and to f/u w/PCP for further evaluation of persistently elevated CK per Melina Copa, PA-C. Pt verbalized understanding to Plan of Care. I will fax results to PCP Dr. Kemper Durie in [place], [place].

## 2013-07-03 NOTE — Telephone Encounter (Signed)
lmptcb x 2 for all lab results

## 2013-07-03 NOTE — Telephone Encounter (Signed)
lmptcb for lab results on Troponin normal

## 2013-07-04 ENCOUNTER — Encounter: Payer: Self-pay | Admitting: Gastroenterology

## 2013-07-06 ENCOUNTER — Emergency Department (HOSPITAL_COMMUNITY)
Admission: EM | Admit: 2013-07-06 | Discharge: 2013-07-06 | Disposition: A | Discharge status: Home / Self Care | Payer: Medicare Other | Attending: Emergency Medicine | Admitting: Emergency Medicine

## 2013-07-06 ENCOUNTER — Emergency Department (HOSPITAL_COMMUNITY): Payer: Medicare Other

## 2013-07-06 ENCOUNTER — Encounter (HOSPITAL_COMMUNITY): Payer: Self-pay | Admitting: Emergency Medicine

## 2013-07-06 DIAGNOSIS — K2970 Gastritis, unspecified, without bleeding: Secondary | ICD-10-CM

## 2013-07-06 DIAGNOSIS — Z7982 Long term (current) use of aspirin: Secondary | ICD-10-CM | POA: Insufficient documentation

## 2013-07-06 DIAGNOSIS — K2990 Gastroduodenitis, unspecified, without bleeding: Secondary | ICD-10-CM

## 2013-07-06 DIAGNOSIS — Z8614 Personal history of Methicillin resistant Staphylococcus aureus infection: Secondary | ICD-10-CM | POA: Insufficient documentation

## 2013-07-06 DIAGNOSIS — Z9089 Acquired absence of other organs: Secondary | ICD-10-CM | POA: Insufficient documentation

## 2013-07-06 DIAGNOSIS — G40909 Epilepsy, unspecified, not intractable, without status epilepticus: Secondary | ICD-10-CM | POA: Insufficient documentation

## 2013-07-06 DIAGNOSIS — Z91040 Latex allergy status: Secondary | ICD-10-CM | POA: Insufficient documentation

## 2013-07-06 DIAGNOSIS — K219 Gastro-esophageal reflux disease without esophagitis: Secondary | ICD-10-CM | POA: Insufficient documentation

## 2013-07-06 DIAGNOSIS — E039 Hypothyroidism, unspecified: Secondary | ICD-10-CM | POA: Insufficient documentation

## 2013-07-06 DIAGNOSIS — G8929 Other chronic pain: Secondary | ICD-10-CM | POA: Insufficient documentation

## 2013-07-06 DIAGNOSIS — I252 Old myocardial infarction: Secondary | ICD-10-CM | POA: Insufficient documentation

## 2013-07-06 DIAGNOSIS — I10 Essential (primary) hypertension: Secondary | ICD-10-CM | POA: Insufficient documentation

## 2013-07-06 DIAGNOSIS — G43909 Migraine, unspecified, not intractable, without status migrainosus: Secondary | ICD-10-CM | POA: Insufficient documentation

## 2013-07-06 DIAGNOSIS — J45909 Unspecified asthma, uncomplicated: Secondary | ICD-10-CM | POA: Insufficient documentation

## 2013-07-06 DIAGNOSIS — E669 Obesity, unspecified: Secondary | ICD-10-CM | POA: Insufficient documentation

## 2013-07-06 DIAGNOSIS — Z79899 Other long term (current) drug therapy: Secondary | ICD-10-CM | POA: Insufficient documentation

## 2013-07-06 LAB — BASIC METABOLIC PANEL
BUN: 9 mg/dL (ref 6–23)
CO2: 23 mEq/L (ref 19–32)
Calcium: 8.8 mg/dL (ref 8.4–10.5)
Chloride: 107 mEq/L (ref 96–112)
Creatinine, Ser: 0.84 mg/dL (ref 0.50–1.10)
GFR calc Af Amer: >90 mL/min (ref >90)
GFR calc non Af Amer: 80 mL/min — ABNORMAL LOW (ref >90)
Glucose, Bld: 95 mg/dL (ref 70–99)
Potassium: 4.2 mEq/L (ref 3.7–5.3)
Sodium: 142 mEq/L (ref 137–147)

## 2013-07-06 LAB — HEPATIC FUNCTION PANEL
ALT: 14 U/L (ref 0–35)
AST: 11 U/L (ref 0–37)
Albumin: 3.6 g/dL (ref 3.5–5.2)
Alkaline Phosphatase: 80 U/L (ref 39–117)
Bilirubin, Direct: 0.2 mg/dL (ref 0.0–0.3)
Indirect Bilirubin: 0.1 mg/dL — ABNORMAL LOW (ref 0.3–0.9)
Total Bilirubin: 0.3 mg/dL (ref 0.3–1.2)
Total Protein: 7.5 g/dL (ref 6.0–8.3)

## 2013-07-06 LAB — POCT I-STAT TROPONIN I: Troponin i, poc: 0.02 ng/mL (ref 0.00–0.08)

## 2013-07-06 LAB — CBC
HCT: 37.8 % (ref 36.0–46.0)
Hemoglobin: 12.3 g/dL (ref 12.0–15.0)
MCH: 27.6 pg (ref 26.0–34.0)
MCHC: 32.5 g/dL (ref 30.0–36.0)
MCV: 84.9 fL (ref 78.0–100.0)
Platelets: 240 10*3/uL (ref 150–400)
RBC: 4.45 MIL/uL (ref 3.87–5.11)
RDW: 14.3 % (ref 11.5–15.5)
WBC: 5.4 10*3/uL (ref 4.0–10.5)

## 2013-07-06 MED ORDER — FAMOTIDINE 20 MG PO TABS
40.0000 mg | ORAL_TABLET | Freq: Once | ORAL | Status: AC
Start: 2013-07-06 — End: 2013-07-06
  Administered 2013-07-06: 40 mg via ORAL
  Filled 2013-07-06: qty 2

## 2013-07-06 MED ORDER — HYDROCODONE-ACETAMINOPHEN 5-325 MG PO TABS: 1.0000 | ORAL_TABLET | Freq: Four times a day (QID) | ORAL | Status: DC | PRN

## 2013-07-06 MED ORDER — GI COCKTAIL ~~LOC~~
30.0000 mL | Freq: Once | ORAL | Status: AC
  Administered 2013-07-06: 30 mL via ORAL
  Filled 2013-07-06: qty 30

## 2013-07-06 MED ORDER — SUCRALFATE 1 G PO TABS
1.0000 g | ORAL_TABLET | Freq: Once | ORAL | Status: AC
  Administered 2013-07-06: 1 g via ORAL
  Filled 2013-07-06: qty 1

## 2013-07-06 MED ORDER — RANITIDINE HCL 150 MG PO TABS: 150.0000 mg | ORAL_TABLET | Freq: Two times a day (BID) | ORAL | Status: DC

## 2013-07-06 NOTE — Discharge Instructions (Signed)

## 2013-07-06 NOTE — ED Provider Notes (Addendum)
CSN: [id number]     Arrival date & time 07/06/13  1006 History   First MD Initiated Contact with Patient 07/06/13 1109     Chief Complaint  Patient presents with  . Chest Pain   (Consider location/radiation/quality/duration/timing/severity/associated sxs/prior Treatment) Patient is a 48 y.o. female presenting with abdominal pain. The history is provided by the patient.  Abdominal Pain Pain location:  Epigastric Pain quality: aching, burning, cramping and pressure   Pain radiates to:  Chest (into the throat) Pain severity:  Severe Onset quality:  Gradual Duration: months but worse over the last 48 hours. Timing:  Intermittent Progression:  Worsening Chronicity:  Chronic Context: eating   Relieved by:  Nothing Worsened by:  Eating, movement and position changes Ineffective treatments:  Liquids, position changes, OTC medications and antacids Associated symptoms: anorexia, chest pain, hematemesis, nausea and vomiting   Associated symptoms: no constipation, no cough, no diarrhea, no fever and no shortness of breath   Associated symptoms comment:  Blood streaked vomitus.  Wakes up in the morning with aweful taste in her mouth Risk factors: no alcohol abuse, no aspirin use and no NSAID use     Past Medical History  Diagnosis Date  . HTN (hypertension)   . Seizure disorder     a. since childhood. b. s/p vagal nerve stimulator at [facility].  . Migraine   . Asthma   . Hypothyroid   . Obesity   . MI (myocardial infarction)   . Complication of anesthesia     hard to wake up-had to be reminded to breath  . Hx of cardiovascular stress test     Lex Myoview 8/14:  Normal, EF 74%  . Chronic chest pain     a. Abnl stress [place] 2008, f/u cath reportedly nl. b. ETT-Myoview 04/2011 - EKG changes but normal perfusion. Cor CT - no coronary calcium, no definite stenosis though mRCA not fully evaluated. c. 10/2011 - tn elevated in [place], LHC without CAD. Started on Ranexa, anti-anginals ?microvascular  dz but later stopped while in hospital on abx.  Marland Kitchen MRSA infection     a. After vagal nerve stimulator at [facility] - surgical site MRSA infection, PICC placed.  . Biliary dyskinesia     a. s/p cholecystectomy.  Marland Kitchen GERD (gastroesophageal reflux disease)     a. Severe.   Past Surgical History  Procedure Laterality Date  . Cardiac catheterization      [facility]   . Cesarean section      placement of vagal nerve stimulator.  . Implantation vagal nerve stimulator  [id number]    battery chg-[facility]  . Cholecystectomy N/A 01/24/2013    Procedure: LAPAROSCOPIC CHOLECYSTECTOMY;  Surgeon: Harl Bowie, MD;  Location: [place];  Service: General;  Laterality: N/A;   Family History  Problem Relation Age of Onset  . Coronary artery disease Mother 2  . Coronary artery disease Maternal Grandmother   . Cancer Other   . Hypertension Other   . Stroke Other    History  Substance Use Topics  . Smoking status: Never Smoker   . Smokeless tobacco: Never Used  . Alcohol Use: 0.0 oz/week    1-3 Glasses of wine per week     Comment: occasional   OB History   Grav Para Term Preterm Abortions TAB SAB Ect Mult Living                 Review of Systems  Constitutional: Negative for fever.  Respiratory:  Negative for cough and shortness of breath.   Cardiovascular: Positive for chest pain.  Gastrointestinal: Positive for nausea, vomiting, abdominal pain, anorexia and hematemesis. Negative for diarrhea and constipation.  All other systems reviewed and are negative.    Allergies  Amantadines; Amitriptyline; Amlodipine; Aspirin; Latex; and Simvastatin  Home Medications   Current Outpatient Rx  Name  Route  Sig  Dispense  Refill  . aspirin EC 81 MG tablet   Oral   Take 81 mg by mouth daily.         . budesonide-formoterol (SYMBICORT) 160-4.5 MCG/ACT inhaler   Inhalation   Inhale 2 puffs into the lungs 2 (two) times daily.         . Coenzyme Q10 200  MG TABS   Oral   Take 1 tablet by mouth every evening.       0   . Cyanocobalamin (VITAMIN B-12 IJ)   Injection   Inject 1,000 mcg as directed every 28 (twenty-eight) days.         Marland Kitchen docusate sodium (COLACE) 100 MG capsule   Oral   Take 200 mg by mouth 2 (two) times daily as needed (constipation).          . furosemide (LASIX) 20 MG tablet   Oral   Take 1 tablet (20 mg total) by mouth daily as needed for edema.   30 tablet   2     .Marland KitchenPatient needs to contact office to schedule  App ...   . hyoscyamine (ANASPAZ) 0.125 MG TBDP disintergrating tablet   Sublingual   Place 0.125-0.375 mg under the tongue See admin instructions. Take 1 to 3 tablets as needed for spasms         . isosorbide mononitrate (IMDUR) 60 MG 24 hr tablet   Oral   Take 60 mg by mouth 2 (two) times daily.         Marland Kitchen levETIRAcetam (KEPPRA) 500 MG tablet   Oral   Take 500-1,000 mg by mouth 2 (two) times daily. 1000mg  in the morning and 500mg  in the evening         . levothyroxine (SYNTHROID, LEVOTHROID) 50 MCG tablet   Oral   Take 50 mcg by mouth daily.          . metoprolol succinate (TOPROL-XL) 25 MG 24 hr tablet   Oral   Take 12.5 mg by mouth 2 (two) times daily.         . nitroGLYCERIN (NITROSTAT) 0.4 MG SL tablet   Sublingual   Place 1 tablet (0.4 mg total) under the tongue every 5 (five) minutes as needed for chest pain.   30 tablet   3   . olmesartan (BENICAR) 20 MG tablet   Oral   Take 20 mg by mouth daily.         . pantoprazole (PROTONIX) 40 MG tablet   Oral   Take 40 mg by mouth 2 (two) times daily.         . potassium chloride (K-DUR) 10 MEQ tablet   Oral   Take 10 mEq by mouth as needed (takes with lasix).          . promethazine (PHENERGAN) 12.5 MG tablet   Oral   Take 12.5 mg by mouth every 6 (six) hours as needed for nausea.         . Sucralfate (CARAFATE PO)   Oral   Take 1 tablet by mouth daily.         Marland Kitchen  sulfamethoxazole-trimethoprim (BACTRIM  DS,SEPTRA DS) 800-160 MG per tablet   Oral   Take 1 tablet by mouth 2 (two) times daily.         . SUMAtriptan (IMITREX) 100 MG tablet   Oral   Take 100 mg by mouth every 2 (two) hours as needed. For migraines         . topiramate (TOPAMAX) 100 MG tablet   Oral   Take 200 mg by mouth 2 (two) times daily.          BP 126/73  Pulse 82  Temp(Src) 98.2 F (36.8 C) (Oral)  Resp 16  Ht 5\' 5"  (1.651 m)  Wt 236 lb 1.6 oz (107.094 kg)  BMI 39.29 kg/m2  SpO2 99% Physical Exam  Nursing note and vitals reviewed. Constitutional: She is oriented to person, place, and time. She appears well-developed and well-nourished. No distress.  HENT:  Head: Normocephalic and atraumatic.  Eyes: EOM are normal. Pupils are equal, round, and reactive to light.  Cardiovascular: Normal rate, regular rhythm, normal heart sounds and intact distal pulses.  Exam reveals no friction rub.   No murmur heard. Pulmonary/Chest: Effort normal and breath sounds normal. She has no wheezes. She has no rales.  Abdominal: Soft. Bowel sounds are normal. She exhibits no distension. There is tenderness in the epigastric area. There is no rebound and no guarding.  Musculoskeletal: Normal range of motion. She exhibits no tenderness.  No edema  Neurological: She is alert and oriented to person, place, and time. No cranial nerve deficit.  Skin: Skin is warm and dry. No rash noted.  Psychiatric: She has a normal mood and affect. Her behavior is normal.    ED Course  Procedures (including critical care time) Labs Review Labs Reviewed  BASIC METABOLIC PANEL - Abnormal; Notable for the following:    GFR calc non Af Amer 80 (*)    All other components within normal limits  HEPATIC FUNCTION PANEL - Abnormal; Notable for the following:    Indirect Bilirubin 0.1 (*)    All other components within normal limits  CBC  POCT I-STAT TROPONIN I   Imaging Review Dg Chest 2 View  07/06/2013   CLINICAL DATA:  Severe chest pain.  Shortness of breath. Current history of hypertension.  EXAM: CHEST  2 VIEW  COMPARISON:  DG CHEST 2 VIEW dated 06/16/2013; DG CHEST 2 VIEW dated 02/09/2013; DG CHEST 2 VIEW dated 08/28/2012; DG CHEST 2 VIEW dated 05/10/2012; CT HEART 05/03/2011.  FINDINGS: Cardiomediastinal silhouette unremarkable and unchanged. Lungs clear. Bronchovascular markings normal. Pulmonary vascularity normal. No visible pleural effusions. No pneumothorax. Visualized bony thorax intact. No significant interval change. Battery generator pack for a stimulating device overlies the left upper chest.  IMPRESSION: No acute cardiopulmonary disease.  Stable examination.   Electronically Signed   By: Evangeline Dakin M.D.   On: 07/06/2013 13:33    EKG Interpretation    Date/Time:  Sunday July 06 2013 10:09:45 EST Ventricular Rate:  79 PR Interval:  114 QRS Duration: 86 QT Interval:  378 QTC Calculation: 433 R Axis:   20 Text Interpretation:  Normal sinus rhythm Left ventricular hypertrophy with repolarization abnormality No significant change since last tracing Confirmed by Lashonda Sonneborn  MD, [name] ([id number]) on 07/06/2013 11:09:14 AM            MDM   1. GERD (gastroesophageal reflux disease)   2. Gastritis      Patient with significant history of severe GERD recently diagnosed on barium  swallow, and a history of recurrent chronic chest pain with multiple cardiac evaluations and catheterizations without significant stenosis, calcification or disease who presents today with worsening symptoms of her ongoing chest pain.  Pain is most significant after eating and when lying down. It eventually gets to the point where she has to vomit and the pain will relieve. She has streaks of blood when she vomits but no gross hematemesis.  She states the pain feels just like all of her prior pains it's more severe however so she's taking multiple nitroglycerin a day because it helps with the esophageal spasm because the Carafate and Protonix  are not helping. Patient is currently in the process of changing all of her medical care over to [facility] were she will have a second opinion by a gastroenterologist there. Her last endoscopy was approximately one year ago and at that time she did not have stomach ulcers. She denies change in the color of her stool. On exam she has mild epigastric tenderness but no other abdominal tenderness. Vital signs are within normal limits an EKG today is unchanged from prior.  Patient given a GI cocktail with significant relief of her pain. She does have a prescription for Dexilant but could not fill it b/c insurance would not cover it.    Ensure no cardiac etiology with troponin. Patient's symptoms have been going on greater than 12 hours if you want to sufficient to rule patient out. CBC, CMP pending  Patient's symptoms today are most likely related to GERD and gastritis. Low suspicion for PE is patient has perked negative and has had multiple evaluations for PEs in the past without ever finding clot. Low suspicion for cardiac disease this patient's EKG is unchanged and troponin is negative.  CBC and CMP wnl.  CXR without acute findings.  Pt's sx improved with GI cocktail and then returned when she drank water.  Will switch to dexilant and h2 blocker.  Pt has carafate at home and will have her f/u with GI.  Blanchie Dessert, MD 07/06/13 1358  Blanchie Dessert, MD 07/06/13 1401

## 2013-07-06 NOTE — ED Notes (Signed)
Pt states 5/10 chest pain at the time. Some relief from pain with GI cocktail. Vital signs stable. Respirations unlabored. Pt is alert and oriented x4.

## 2013-07-06 NOTE — ED Notes (Signed)
Pt reports shes had CP since yesterday. She took 8 nitro since yesterday which relieves the pain but then it returns. Also she was recently diagnosed with muscle spasms in her chest and she was given a muscle relaxer which she took with some relief but then pain returns. States she has been having similar pain like this for months and shes been to a cardiologist and gastroenterologist which both told her that they didn't know what was causing her pain.

## 2013-07-29 ENCOUNTER — Ambulatory Visit: Payer: Medicare Other | Admitting: Gastroenterology

## 2013-08-28 ENCOUNTER — Ambulatory Visit: Payer: Medicare Other | Admitting: Cardiology

## 2013-10-05 ENCOUNTER — Other Ambulatory Visit: Payer: Self-pay | Admitting: Cardiology

## 2013-10-09 ENCOUNTER — Encounter (HOSPITAL_COMMUNITY): Payer: Self-pay | Admitting: Emergency Medicine

## 2013-10-09 ENCOUNTER — Emergency Department (HOSPITAL_COMMUNITY): Payer: Medicare Other

## 2013-10-09 ENCOUNTER — Emergency Department (HOSPITAL_COMMUNITY)
Admission: EM | Admit: 2013-10-09 | Discharge: 2013-10-10 | Disposition: A | Discharge status: Home / Self Care | Payer: Medicare Other | Attending: Emergency Medicine | Admitting: Emergency Medicine

## 2013-10-09 DIAGNOSIS — R110 Nausea: Secondary | ICD-10-CM | POA: Insufficient documentation

## 2013-10-09 DIAGNOSIS — J45901 Unspecified asthma with (acute) exacerbation: Secondary | ICD-10-CM | POA: Insufficient documentation

## 2013-10-09 DIAGNOSIS — R079 Chest pain, unspecified: Secondary | ICD-10-CM

## 2013-10-09 DIAGNOSIS — Z9889 Other specified postprocedural states: Secondary | ICD-10-CM | POA: Insufficient documentation

## 2013-10-09 DIAGNOSIS — K219 Gastro-esophageal reflux disease without esophagitis: Secondary | ICD-10-CM | POA: Insufficient documentation

## 2013-10-09 DIAGNOSIS — E039 Hypothyroidism, unspecified: Secondary | ICD-10-CM | POA: Insufficient documentation

## 2013-10-09 DIAGNOSIS — E669 Obesity, unspecified: Secondary | ICD-10-CM | POA: Insufficient documentation

## 2013-10-09 DIAGNOSIS — Z79899 Other long term (current) drug therapy: Secondary | ICD-10-CM | POA: Insufficient documentation

## 2013-10-09 DIAGNOSIS — Z8614 Personal history of Methicillin resistant Staphylococcus aureus infection: Secondary | ICD-10-CM | POA: Insufficient documentation

## 2013-10-09 DIAGNOSIS — G8929 Other chronic pain: Secondary | ICD-10-CM | POA: Insufficient documentation

## 2013-10-09 DIAGNOSIS — I10 Essential (primary) hypertension: Secondary | ICD-10-CM | POA: Insufficient documentation

## 2013-10-09 DIAGNOSIS — R072 Precordial pain: Secondary | ICD-10-CM | POA: Insufficient documentation

## 2013-10-09 DIAGNOSIS — G40909 Epilepsy, unspecified, not intractable, without status epilepticus: Secondary | ICD-10-CM | POA: Insufficient documentation

## 2013-10-09 DIAGNOSIS — Z91040 Latex allergy status: Secondary | ICD-10-CM | POA: Insufficient documentation

## 2013-10-09 DIAGNOSIS — I252 Old myocardial infarction: Secondary | ICD-10-CM | POA: Insufficient documentation

## 2013-10-09 DIAGNOSIS — G43909 Migraine, unspecified, not intractable, without status migrainosus: Secondary | ICD-10-CM | POA: Insufficient documentation

## 2013-10-09 LAB — BASIC METABOLIC PANEL
BUN: 11 mg/dL (ref 6–23)
CO2: 22 mEq/L (ref 19–32)
Calcium: 8.6 mg/dL (ref 8.4–10.5)
Chloride: 109 mEq/L (ref 96–112)
Creatinine, Ser: 0.68 mg/dL (ref 0.50–1.10)
GFR calc Af Amer: >90 mL/min (ref >90)
GFR calc non Af Amer: >90 mL/min (ref >90)
Glucose, Bld: 91 mg/dL (ref 70–99)
Potassium: 3.9 mEq/L (ref 3.7–5.3)
Sodium: 141 mEq/L (ref 137–147)

## 2013-10-09 LAB — CBC
HCT: 36.6 % (ref 36.0–46.0)
Hemoglobin: 11.9 g/dL — ABNORMAL LOW (ref 12.0–15.0)
MCH: 27.2 pg (ref 26.0–34.0)
MCHC: 32.5 g/dL (ref 30.0–36.0)
MCV: 83.6 fL (ref 78.0–100.0)
Platelets: 204 10*3/uL (ref 150–400)
RBC: 4.38 MIL/uL (ref 3.87–5.11)
RDW: 14 % (ref 11.5–15.5)
WBC: 5.9 10*3/uL (ref 4.0–10.5)

## 2013-10-09 LAB — I-STAT TROPONIN, ED
Troponin i, poc: 0.02 ng/mL (ref 0.00–0.08)
Troponin i, poc: 0.01 ng/mL (ref 0.00–0.08)

## 2013-10-09 MED ORDER — METHOCARBAMOL 1000 MG/10ML IJ SOLN
1000.0000 mg | Freq: Once | INTRAMUSCULAR | Status: AC
  Administered 2013-10-09: 1000 mg via INTRAVENOUS
  Filled 2013-10-09: qty 10

## 2013-10-09 MED ORDER — METHOCARBAMOL 1000 MG/10ML IJ SOLN: 1000.0000 mg | Freq: Once | INTRAMUSCULAR | Status: DC

## 2013-10-09 MED ORDER — NITROGLYCERIN 0.4 MG SL SUBL
0.4000 mg | SUBLINGUAL_TABLET | SUBLINGUAL | Status: DC | PRN
  Filled 2013-10-09 (×2): qty 1

## 2013-10-09 MED ORDER — NITROGLYCERIN 0.4 MG SL SUBL
0.4000 mg | SUBLINGUAL_TABLET | SUBLINGUAL | Status: DC | PRN
  Administered 2013-10-09 (×2): 0.4 mg via SUBLINGUAL

## 2013-10-09 MED ORDER — GI COCKTAIL ~~LOC~~
30.0000 mL | Freq: Once | ORAL | Status: AC
Start: 2013-10-09 — End: 2013-10-09
  Administered 2013-10-09: 30 mL via ORAL
  Filled 2013-10-09: qty 30

## 2013-10-09 MED ORDER — ONDANSETRON 4 MG PO TBDP
4.0000 mg | ORAL_TABLET | Freq: Once | ORAL | Status: AC
Start: 2013-10-09 — End: 2013-10-09
  Administered 2013-10-09: 4 mg via ORAL
  Filled 2013-10-09: qty 1

## 2013-10-09 NOTE — ED Notes (Signed)
Pt states that she has had a heart cath in [place], [place] and nothing was seen. States she has seen Dr Collene Mares (GI) for symptoms. States that she has been referred to [facility] for further testing.

## 2013-10-09 NOTE — ED Notes (Signed)
Pt in c/o substernal chest pain since walking outside this am, states pain has been constant but at times is worse than others, describes pain as a squeezing, also increased shortness of breath- pt uses supplemental oxygen at times but states she has been using this all day due to feeling like she can't catch her breath, also n/v, no distress noted at this time

## 2013-10-09 NOTE — ED Notes (Signed)
Pt in c/o increased nausea and episode of increased pain, states this is the same way her pain has been presenting, it will come in a wave and last for a few min and then decrease, pt reports improvement after a few min.

## 2013-10-09 NOTE — ED Notes (Signed)
Pt c/o continued increased in pain, states frequency has increased- informed RN at this time that she took a nitro PTA and that was what relieved pain earlier, pt taken to room to be more closely monitored

## 2013-10-10 ENCOUNTER — Telehealth: Payer: Self-pay | Admitting: Cardiology

## 2013-10-10 MED ORDER — METHOCARBAMOL 500 MG PO TABS: 500.0000 mg | ORAL_TABLET | Freq: Two times a day (BID) | ORAL | Status: DC

## 2013-10-10 NOTE — ED Provider Notes (Signed)
CSN: [id number]     Arrival date & time 10/09/13  1726 History   First MD Initiated Contact with Patient 10/09/13 2120     Chief Complaint  Patient presents with  . Chest Pain     (Consider location/radiation/quality/duration/timing/severity/associated sxs/prior Treatment) HPI Comments: Patient with a history of CAD, HTN, Migraine headache, MI, GERD and chronic chest pain with multiple workups including a recent cardiac catheterization in [place] in August of last year which the patient reports showed a "massive heart attack" but review of records from Cardiology who obtained records no cardiac cause to the problem.  She states that she has daily chest pain, but usually is able to take a nitroglycerin and this helps.  She states that her cardiologist report that this is not cardiac chest pain but they believe this to be esophageal spasm.  She states that she sees Dr. Collene Mares with GI who reports this is not GI related though she does have a history of GERD.  She states when she gets the pain it is tight and squeezing, radiates into both shoulders causes nausea and shortness of breath.  She denies fever, chills, cough, burping, epigastric abdominal pain or vomiting  Patient is a 48 y.o. female presenting with chest pain. The history is provided by the patient. No language interpreter was used.  Chest Pain Pain location:  Substernal area Pain quality: aching and stabbing   Pain radiates to:  L shoulder and R shoulder Pain radiates to the back: no   Pain severity:  Severe Onset quality:  Gradual Timing:  Constant Progression:  Worsening Chronicity:  Chronic Context: not breathing, not eating, no intercourse, not lifting, no movement, not raising an arm, not at rest, no stress and no trauma   Relieved by:  Nitroglycerin Worsened by:  Nothing tried Ineffective treatments:  None tried Associated symptoms: fatigue, nausea and shortness of breath   Associated symptoms: no abdominal pain, no anorexia,  no anxiety, no back pain, no cough, no diaphoresis, no dizziness, no headache, no lower extremity edema, no numbness, no orthopnea, no palpitations, not vomiting and no weakness     Past Medical History  Diagnosis Date  . HTN (hypertension)   . Seizure disorder     a. since childhood. b. s/p vagal nerve stimulator at [facility].  . Migraine   . Asthma   . Hypothyroid   . Obesity   . MI (myocardial infarction)   . Complication of anesthesia     hard to wake up-had to be reminded to breath  . Hx of cardiovascular stress test     Lex Myoview 8/14:  Normal, EF 74%  . Chronic chest pain     a. Abnl stress [place] 2008, f/u cath reportedly nl. b. ETT-Myoview 04/2011 - EKG changes but normal perfusion. Cor CT - no coronary calcium, no definite stenosis though mRCA not fully evaluated. c. 10/2011 - tn elevated in [place], LHC without CAD. Started on Ranexa, anti-anginals ?microvascular dz but later stopped while in hospital on abx.  Marland Kitchen MRSA infection     a. After vagal nerve stimulator at [facility] - surgical site MRSA infection, PICC placed.  . Biliary dyskinesia     a. s/p cholecystectomy.  Marland Kitchen GERD (gastroesophageal reflux disease)     a. Severe.   Past Surgical History  Procedure Laterality Date  . Cardiac catheterization      [facility]   . Cesarean section      placement of vagal nerve stimulator.  Marland Kitchen  Implantation vagal nerve stimulator  2000,2013    battery chg-[facility]  . Cholecystectomy N/A 01/24/2013    Procedure: LAPAROSCOPIC CHOLECYSTECTOMY;  Surgeon: Harl Bowie, MD;  Location: [place];  Service: General;  Laterality: N/A;   Family History  Problem Relation Age of Onset  . Coronary artery disease Mother 12  . Coronary artery disease Maternal Grandmother   . Cancer Other   . Hypertension Other   . Stroke Other    History  Substance Use Topics  . Smoking status: Never Smoker   . Smokeless tobacco: Never Used  . Alcohol Use: 0.0 oz/week     1-3 Glasses of wine per week     Comment: occasional   OB History   Grav Para Term Preterm Abortions TAB SAB Ect Mult Living                 Review of Systems  Constitutional: Positive for fatigue. Negative for diaphoresis.  Respiratory: Positive for shortness of breath. Negative for cough.   Cardiovascular: Positive for chest pain. Negative for palpitations and orthopnea.  Gastrointestinal: Positive for nausea. Negative for vomiting, abdominal pain and anorexia.  Musculoskeletal: Negative for back pain.  Neurological: Negative for dizziness, weakness, numbness and headaches.  All other systems reviewed and are negative.     Allergies  Amantadines; Amitriptyline; Amlodipine; Aspirin; Latex; and Simvastatin  Home Medications   Prior to Admission medications   Medication Sig Start Date End Date Taking? Authorizing Provider  budesonide-formoterol (SYMBICORT) 160-4.5 MCG/ACT inhaler Inhale 2 puffs into the lungs 2 (two) times daily.   Yes Historical Provider, MD  Coenzyme Q10 200 MG TABS Take 1 tablet by mouth every evening.  02/07/12  Yes Larey Dresser, MD  Cyanocobalamin (VITAMIN B-12 IJ) Inject 1,000 mcg as directed every 28 (twenty-eight) days.   Yes Historical Provider, MD  fexofenadine (ALLEGRA) 180 MG tablet Take 180 mg by mouth daily as needed for allergies or rhinitis.   Yes Historical Provider, MD  furosemide (LASIX) 20 MG tablet Take 1 tablet (20 mg total) by mouth daily as needed for edema. 04/29/13  Yes Larey Dresser, MD  HYDROcodone-acetaminophen (NORCO/VICODIN) 5-325 MG per tablet Take 1 tablet by mouth every 6 (six) hours as needed for moderate pain.   Yes Historical Provider, MD  hyoscyamine (ANASPAZ) 0.125 MG TBDP disintergrating tablet Place 0.125-0.375 mg under the tongue See admin instructions. Take 1 to 3 tablets as needed for spasms   Yes Historical Provider, MD  isosorbide mononitrate (IMDUR) 60 MG 24 hr tablet Take 60 mg by mouth 2 (two) times daily.   Yes  Historical Provider, MD  levETIRAcetam (KEPPRA) 500 MG tablet Take 500-1,000 mg by mouth 2 (two) times daily. 1000mg  in the morning and 500mg  in the evening   Yes Historical Provider, MD  levothyroxine (SYNTHROID, LEVOTHROID) 50 MCG tablet Take 50 mcg by mouth daily.    Yes Historical Provider, MD  metoprolol succinate (TOPROL-XL) 25 MG 24 hr tablet Take 12.5 mg by mouth 2 (two) times daily.   Yes Historical Provider, MD  nitroGLYCERIN (NITROSTAT) 0.4 MG SL tablet Place 1 tablet (0.4 mg total) under the tongue every 5 (five) minutes as needed for chest pain. 07/02/13  Yes Dayna N Dunn, PA-C  olmesartan (BENICAR) 20 MG tablet Take 20 mg by mouth daily.   Yes Historical Provider, MD  pantoprazole (PROTONIX) 40 MG tablet Take 40 mg by mouth 2 (two) times daily. 02/10/13  Yes Delfina Redwood, MD  potassium  chloride (K-DUR) 10 MEQ tablet Take 10 mEq by mouth as needed (takes with lasix).  05/09/12  Yes Scott Joylene Draft, PA-C  promethazine (PHENERGAN) 12.5 MG tablet Take 12.5 mg by mouth every 6 (six) hours as needed for nausea.   Yes Historical Provider, MD  ranitidine (ZANTAC) 150 MG tablet Take 1 tablet (150 mg total) by mouth 2 (two) times daily. 07/06/13  Yes Blanchie Dessert, MD  topiramate (TOPAMAX) 100 MG tablet Take 200 mg by mouth 2 (two) times daily.   Yes Historical Provider, MD  methocarbamol (ROBAXIN) 500 MG tablet Take 1 tablet (500 mg total) by mouth 2 (two) times daily. 10/10/13   Idalia Needle. Ismerai Bin, PA-C  SUMAtriptan (IMITREX) 100 MG tablet Take 100 mg by mouth every 2 (two) hours as needed. For migraines    Historical Provider, MD   BP 126/83  Pulse 93  Temp(Src) 97.1 F (36.2 C) (Oral)  Resp 21  SpO2 100%  LMP 09/03/2013 Physical Exam  Nursing note and vitals reviewed. Constitutional: She is oriented to person, place, and time. She appears well-developed and well-nourished. No distress.  HENT:  Head: Normocephalic and atraumatic.  Right Ear: External ear normal.  Left Ear: External  ear normal.  Nose: Nose normal.  Mouth/Throat: Oropharynx is clear and moist. No oropharyngeal exudate.  Eyes: Conjunctivae are normal. Pupils are equal, round, and reactive to light. No scleral icterus.  Neck: Normal range of motion. Neck supple.  Cardiovascular: Normal rate, regular rhythm and normal heart sounds.  Exam reveals no gallop and no friction rub.   No murmur heard. Pulmonary/Chest: Effort normal and breath sounds normal. No respiratory distress. She has no wheezes. She has no rales. She exhibits no tenderness.  Abdominal: Soft. Bowel sounds are normal. She exhibits no distension. There is no tenderness. There is no rebound and no guarding.  Musculoskeletal: Normal range of motion. She exhibits no edema and no tenderness.  Lymphadenopathy:    She has no cervical adenopathy.  Neurological: She is alert and oriented to person, place, and time. She exhibits normal muscle tone. Coordination normal.  Skin: Skin is warm and dry. No rash noted. No erythema. No pallor.  Psychiatric: She has a normal mood and affect. Her behavior is normal. Judgment and thought content normal.    ED Course  Procedures (including critical care time) Labs Review Labs Reviewed  CBC - Abnormal; Notable for the following:    Hemoglobin 11.9 (*)    All other components within normal limits  BASIC METABOLIC PANEL  I-STAT TROPOININ, ED  Randolm Idol, ED    Imaging Review Dg Chest 2 View  10/09/2013   CLINICAL DATA:  Chest pain.  EXAM: CHEST  2 VIEW  COMPARISON:  07/06/2013.  FINDINGS: The cardiac silhouette, mediastinal and hilar contours are within normal limits and stable. The lungs are clear. No pleural effusion. Stable left upper chest electronic stimulator. The bony thorax is intact.  IMPRESSION: No acute cardiopulmonary findings.   Electronically Signed   By: Kalman Jewels M.D.   On: 10/09/2013 18:57     EKG Interpretation None     Results for orders placed during the hospital encounter  of 10/09/13  CBC      Result Value Ref Range   WBC 5.9  4.0 - 10.5 K/uL   RBC 4.38  3.87 - 5.11 MIL/uL   Hemoglobin 11.9 (*) 12.0 - 15.0 g/dL   HCT 36.6  36.0 - 46.0 %   MCV 83.6  78.0 - 100.0  fL   MCH 27.2  26.0 - 34.0 pg   MCHC 32.5  30.0 - 36.0 g/dL   RDW 14.0  11.5 - 15.5 %   Platelets 204  150 - 400 K/uL  BASIC METABOLIC PANEL      Result Value Ref Range   Sodium 141  137 - 147 mEq/L   Potassium 3.9  3.7 - 5.3 mEq/L   Chloride 109  96 - 112 mEq/L   CO2 22  19 - 32 mEq/L   Glucose, Bld 91  70 - 99 mg/dL   BUN 11  6 - 23 mg/dL   Creatinine, Ser 0.68  0.50 - 1.10 mg/dL   Calcium 8.6  8.4 - 10.5 mg/dL   GFR calc non Af Amer >90  >90 mL/min   GFR calc Af Amer >90  >90 mL/min  I-STAT TROPOININ, ED      Result Value Ref Range   Troponin i, poc 0.02  0.00 - 0.08 ng/mL   Comment 3           I-STAT TROPOININ, ED      Result Value Ref Range   Troponin i, poc 0.01  0.00 - 0.08 ng/mL   Comment 3            Dg Chest 2 View  10/09/2013   CLINICAL DATA:  Chest pain.  EXAM: CHEST  2 VIEW  COMPARISON:  07/06/2013.  FINDINGS: The cardiac silhouette, mediastinal and hilar contours are within normal limits and stable. The lungs are clear. No pleural effusion. Stable left upper chest electronic stimulator. The bony thorax is intact.  IMPRESSION: No acute cardiopulmonary findings.   Electronically Signed   By: Kalman Jewels M.D.   On: 10/09/2013 18:57    Medications  nitroGLYCERIN (NITROSTAT) SL tablet 0.4 mg (not administered)  nitroGLYCERIN (NITROSTAT) SL tablet 0.4 mg (0.4 mg Sublingual Given 10/09/13 2148)  ondansetron (ZOFRAN-ODT) disintegrating tablet 4 mg (4 mg Oral Given 10/09/13 1912)  gi cocktail (Maalox,Lidocaine,Donnatal) (30 mLs Oral Given 10/09/13 2140)  methocarbamol (ROBAXIN) 1,000 mg in dextrose 5 % 50 mL IVPB (0 mg Intravenous Stopped 10/09/13 2253)    MDM   Atypical Chest pain  Patient with chronic chest pain presents with similar episode to previous.  Review of records  show numerous work ups without apparent cause.  She reports relief of symptoms here with nitroglycerin, GI cocktail and robaxin.  I will prescribe the patient the robaxin as this also helped - she reports that she will be seeing Dr. Collene Mares this coming week for follow up.      Idalia Needle Joelyn Oms, [place] 10/10/13 [phone]

## 2013-10-10 NOTE — Telephone Encounter (Signed)
Will forward to Dr McLean 

## 2013-10-10 NOTE — Discharge Instructions (Signed)
Cardiac Biomarkers Cardiac biomarkers are enzymes, proteins, and hormones that are associated with heart function, damage or failure. Some of the tests are specific for the heart while others are also elevated with skeletal muscle damage. Cardiac biomarkers are used for diagnostic and prognostic purposes and are frequently ordered by caregivers when someone comes into the Emergency Room complaining of symptoms, such as chest pain, pressure, nausea, and shortness of breath. These tests are ordered, along with other laboratory and non-laboratory tests, to detect heart failure (which is often a chronic, progressive condition affecting the ability of the heart to fill with blood and pump efficiently) and the acute coronary syndromes (ACS) as well as to help determine prognosis for people who have had a heart attack. ACS is a group of symptoms that reflect a sudden decrease in the amount of blood and oxygen, also termed 'ischemia,' reaching the heart. This decrease is frequently due to either a narrowing of the coronary arteries (atherosclerosis or vessel spasm) or unstable plaques, which can cause a blood clot (thrombus) and blockage of blood flow. If the oxygen supply is low, it can cause angina (pain); if blood flow is reduced, it can cause death of heart cells (called myocardial infarction or heart attack) and can lead to death of the affected heart muscle cells and to permanent damage and scarring of the heart.  The goal with cardiac biomarkers is to be able to detect the presence and severity of an acute heart condition as soon as possible so that appropriate treatment can be initiated.  There are only a few cardiac biomarkers that are being routinely used by physicians. Some have been phased out because they are not as specific as the marker of choice  troponin. Many other potential cardiac biomarkers are still being researched but their clinical utility has yet to be established.  Note: Cardiac biomarkers are  not the same tests as those that are used to screen the general healthy population for their risk of developing heart disease. Those can be found under Cardiac Risk Assessment. LABORATORY TESTS CURRENT CARDIAC BIOMARKERS   CK and CK-MB  BNP or (NT-proBNP)  Troponin  Myoglobin (not always used; sometimes ordered with troponin) MORE GENERAL TESTS FREQUENTLY ORDERED ALONG WITH CARDIAC BIOMARKERS   Blood Gases  CMP  BMP  Electrolytes  CBC ON THE HORIZON Ischemia modified albumin (IMA)  Test has received FDA approval for use with troponin and electrocardiogram to rule out acute coronary syndrome (ACS) in patients with chest pain. May become useful for identifying patients at higher risk of heart attack and potentially could replace myoglobin one day.  NON-LABORATORY TESTS These tests allow caregivers to look at the size, shape, and function of the heart as it is beating. They can be used to detect changes to the rhythm of the heart as well as to detect and evaluate damaged tissues and blocked arteries.   EKG (ECG, electrocardiogram)  Coronary angiography (or arteriography)  Stress testing  Nuclear scan  ECG (echocardiogram)  Chest X-ray THE FOLLOWING SUMMARIZES CURRENTLY USED CARDIAC BIOMARKERS. Marker: CK  What: Enzyme that exists in three different isoforms  Where Found: Heart, brain, and skeletal muscle  What Indicates: Injury to muscle cells  Time to Increase: 4 to 6 hours after injury, peaks in 18 to 24 hours  Time back to Normal: Normal in 48 to 72 hours, unless due to continuing injury  When/How Used: Being phased out, may be ordered prior to CK-MB Marker: CK-MB  What: Heart- related portion  of total CK enzyme  Where Found: Heart primarily, but also in skeletal muscle  What Indicates: Injury (cell death) to heart  Time to Increase: 4 to 6 hrs after heart attack, peaks in 12 to 20 hours  Time back to Normal: Returns to normal in 24 to 48 hours unless  new/continual damage  When/How Used: Not as specific as Troponin for heart injury/attack, may be ordered when Troponin is not available, may be ordered to monitor new/continuing damage Marker: Myoglobin  What: Small oxygen-storing protein  Where Found: Heart and other muscle cells  What Indicates: Injury to heart or other muscle cells. Also elevated with kidney problems.  Time to Increase: Starts to rise within 2 to 3 hours, peaks in 8 to 12 hours.  Time back to Normal: Falls back to normal by about one day after injury occurred  When/How Used: Ordered along with Troponin, helps diagnose heart injury/attack Marker: Cardiac Troponin  What: Components of a Regulatory protein complex. Two cardiac specific isoforms: T and I  Where Found: Heart muscle  What Indicates: Heart injury/damage  Time to Increase: 4 to 8 hours  Time back to Normal: Remains elevated for 7 to 14 days  When/How Used: Ordered to help assess prognosis and diagnose heart attack Marker: LDH  What: Enzyme  Where Found: Almost all body tissues  What Indicates: General marker of injury to cells  When/How Used: Phased out, not specific Marker: AST  What: Enzyme  Where Found: Almost all body tissues  What Indicates: General marker of injury to cells  When/How Used: Phased out, not specific Marker: Hs-CRP  What: Protein  Where Found: Associated with athero-sclerosis  What Indicates: Inflammatory process  Time back to Normal: Elevated with inflammation  When/How Used: May help determine prognosis of patients who have had heart attack Marker: BNP  What: Hormone  Where Found: Heart's left ventricle  What Indicates: Heart failure  Time back to Normal: Elevation related to severity  When/How Used: Help diagnose and evaluate heart failure, prognosis, and to monitor therapy Document Released: 06/14/2004 Document Revised: 08/14/2011 Document Reviewed: 03/01/2005 [facility] Patient Information 2014  [place], [place].  Chest Pain (Nonspecific) It is often hard to give a specific diagnosis for the cause of chest pain. There is always a chance that your pain could be related to something serious, such as a heart attack or a blood clot in the lungs. You need to follow up with your caregiver for further evaluation. CAUSES   Heartburn.  Pneumonia or bronchitis.  Anxiety or stress.  Inflammation around your heart (pericarditis) or lung (pleuritis or pleurisy).  A blood clot in the lung.  A collapsed lung (pneumothorax). It can develop suddenly on its own (spontaneous pneumothorax) or from injury (trauma) to the chest.  Shingles infection (herpes zoster virus). The chest wall is composed of bones, muscles, and cartilage. Any of these can be the source of the pain.  The bones can be bruised by injury.  The muscles or cartilage can be strained by coughing or overwork.  The cartilage can be affected by inflammation and become sore (costochondritis). DIAGNOSIS  Lab tests or other studies, such as X-rays, electrocardiography, stress testing, or cardiac imaging, may be needed to find the cause of your pain.  TREATMENT   Treatment depends on what may be causing your chest pain. Treatment may include:  Acid blockers for heartburn.  Anti-inflammatory medicine.  Pain medicine for inflammatory conditions.  Antibiotics if an infection is present.  You may be advised  to change lifestyle habits. This includes stopping smoking and avoiding alcohol, caffeine, and chocolate.  You may be advised to keep your head raised (elevated) when sleeping. This reduces the chance of acid going backward from your stomach into your esophagus.  Most of the time, nonspecific chest pain will improve within 2 to 3 days with rest and mild pain medicine. HOME CARE INSTRUCTIONS   If antibiotics were prescribed, take your antibiotics as directed. Finish them even if you start to feel better.  For the next few  days, avoid physical activities that bring on chest pain. Continue physical activities as directed.  Do not smoke.  Avoid drinking alcohol.  Only take over-the-counter or prescription medicine for pain, discomfort, or fever as directed by your caregiver.  Follow your caregiver's suggestions for further testing if your chest pain does not go away.  Keep any follow-up appointments you made. If you do not go to an appointment, you could develop lasting (chronic) problems with pain. If there is any problem keeping an appointment, you must call to reschedule. SEEK MEDICAL CARE IF:   You think you are having problems from the medicine you are taking. Read your medicine instructions carefully.  Your chest pain does not go away, even after treatment.  You develop a rash with blisters on your chest. SEEK IMMEDIATE MEDICAL CARE IF:   You have increased chest pain or pain that spreads to your arm, neck, jaw, back, or abdomen.  You develop shortness of breath, an increasing cough, or you are coughing up blood.  You have severe back or abdominal pain, feel nauseous, or vomit.  You develop severe weakness, fainting, or chills.  You have a fever. THIS IS AN EMERGENCY. Do not wait to see if the pain will go away. Get medical help at once. Call your local emergency services (911 in [place] Do not drive yourself to the hospital. MAKE SURE YOU:   Understand these instructions.  Will watch your condition.  Will get help right away if you are not doing well or get worse. Document Released: 03/01/2005 Document Revised: 08/14/2011 Document Reviewed: 12/26/2007 [facility] Patient Information 2014 [place].

## 2013-10-10 NOTE — Telephone Encounter (Signed)
New message    FYI Want [place] to know she is on the way to the hosp because she is having chest pain

## 2013-10-13 NOTE — ED Provider Notes (Signed)
Medical screening examination/treatment/procedure(s) were performed by non-physician practitioner and as supervising physician I was immediately available for consultation/collaboration.   EKG Interpretation   Date/Time:  Thursday Oct 09 2013 17:29:26 EDT Ventricular Rate:  83 PR Interval:  108 QRS Duration: 88 QT Interval:  342 QTC Calculation: 401 R Axis:   42 Text Interpretation:  Sinus rhythm with short PR Nonspecific ST and T wave  abnormality Abnormal ECG ED PHYSICIAN INTERPRETATION AVAILABLE IN [place]  [facility] Confirmed by TEST, Record ([id number]) on 10/11/2013 1:23:44 PM        Mervin Kung, MD 10/13/13 [phone]

## 2013-10-29 ENCOUNTER — Ambulatory Visit: Payer: Medicare Other | Admitting: Cardiology

## 2013-11-12 ENCOUNTER — Encounter: Payer: Self-pay | Admitting: Cardiology

## 2013-12-19 ENCOUNTER — Other Ambulatory Visit: Payer: Self-pay

## 2013-12-19 ENCOUNTER — Emergency Department (HOSPITAL_COMMUNITY)
Admission: EM | Admit: 2013-12-19 | Discharge: 2013-12-19 | Disposition: A | Discharge status: Home / Self Care | Payer: Medicare Other | Attending: Emergency Medicine | Admitting: Emergency Medicine

## 2013-12-19 ENCOUNTER — Encounter (HOSPITAL_COMMUNITY): Payer: Self-pay | Admitting: Emergency Medicine

## 2013-12-19 ENCOUNTER — Emergency Department (HOSPITAL_COMMUNITY): Payer: Medicare Other

## 2013-12-19 DIAGNOSIS — I252 Old myocardial infarction: Secondary | ICD-10-CM | POA: Insufficient documentation

## 2013-12-19 DIAGNOSIS — J45909 Unspecified asthma, uncomplicated: Secondary | ICD-10-CM | POA: Insufficient documentation

## 2013-12-19 DIAGNOSIS — G43909 Migraine, unspecified, not intractable, without status migrainosus: Secondary | ICD-10-CM | POA: Insufficient documentation

## 2013-12-19 DIAGNOSIS — Z79899 Other long term (current) drug therapy: Secondary | ICD-10-CM | POA: Insufficient documentation

## 2013-12-19 DIAGNOSIS — E039 Hypothyroidism, unspecified: Secondary | ICD-10-CM | POA: Insufficient documentation

## 2013-12-19 DIAGNOSIS — Z8614 Personal history of Methicillin resistant Staphylococcus aureus infection: Secondary | ICD-10-CM | POA: Insufficient documentation

## 2013-12-19 DIAGNOSIS — G40909 Epilepsy, unspecified, not intractable, without status epilepticus: Secondary | ICD-10-CM | POA: Insufficient documentation

## 2013-12-19 DIAGNOSIS — I10 Essential (primary) hypertension: Secondary | ICD-10-CM | POA: Insufficient documentation

## 2013-12-19 DIAGNOSIS — K219 Gastro-esophageal reflux disease without esophagitis: Secondary | ICD-10-CM | POA: Insufficient documentation

## 2013-12-19 DIAGNOSIS — Z91040 Latex allergy status: Secondary | ICD-10-CM | POA: Insufficient documentation

## 2013-12-19 DIAGNOSIS — R079 Chest pain, unspecified: Secondary | ICD-10-CM | POA: Insufficient documentation

## 2013-12-19 DIAGNOSIS — G8929 Other chronic pain: Secondary | ICD-10-CM

## 2013-12-19 DIAGNOSIS — K224 Dyskinesia of esophagus: Secondary | ICD-10-CM

## 2013-12-19 DIAGNOSIS — E669 Obesity, unspecified: Secondary | ICD-10-CM | POA: Insufficient documentation

## 2013-12-19 LAB — BASIC METABOLIC PANEL
Anion gap: 16 — ABNORMAL HIGH (ref 5–15)
BUN: 13 mg/dL (ref 6–23)
CO2: 19 mEq/L (ref 19–32)
Calcium: 9.2 mg/dL (ref 8.4–10.5)
Chloride: 106 mEq/L (ref 96–112)
Creatinine, Ser: 0.93 mg/dL (ref 0.50–1.10)
GFR calc Af Amer: 82 mL/min — ABNORMAL LOW (ref >90)
GFR calc non Af Amer: 71 mL/min — ABNORMAL LOW (ref >90)
Glucose, Bld: 81 mg/dL (ref 70–99)
Potassium: 3.8 mEq/L (ref 3.7–5.3)
Sodium: 141 mEq/L (ref 137–147)

## 2013-12-19 LAB — CBC
HCT: 38.8 % (ref 36.0–46.0)
Hemoglobin: 12.5 g/dL (ref 12.0–15.0)
MCH: 27.2 pg (ref 26.0–34.0)
MCHC: 32.2 g/dL (ref 30.0–36.0)
MCV: 84.3 fL (ref 78.0–100.0)
Platelets: 194 10*3/uL (ref 150–400)
RBC: 4.6 MIL/uL (ref 3.87–5.11)
RDW: 14.5 % (ref 11.5–15.5)
WBC: 6.4 10*3/uL (ref 4.0–10.5)

## 2013-12-19 LAB — I-STAT TROPONIN, ED: Troponin i, poc: 0.01 ng/mL (ref 0.00–0.08)

## 2013-12-19 MED ORDER — METOCLOPRAMIDE HCL 5 MG/ML IJ SOLN
10.0000 mg | Freq: Once | INTRAMUSCULAR | Status: AC
  Administered 2013-12-19: 10 mg via INTRAMUSCULAR
  Filled 2013-12-19: qty 2

## 2013-12-19 MED ORDER — NITROGLYCERIN 0.4 MG SL SUBL
0.4000 mg | SUBLINGUAL_TABLET | SUBLINGUAL | Status: DC | PRN
  Administered 2013-12-19: 0.4 mg via SUBLINGUAL

## 2013-12-19 MED ORDER — METOCLOPRAMIDE HCL 10 MG PO TABS
10.0000 mg | ORAL_TABLET | Freq: Once | ORAL | Status: DC
  Filled 2013-12-19: qty 1

## 2013-12-19 MED ORDER — DICYCLOMINE HCL 10 MG PO CAPS
20.0000 mg | ORAL_CAPSULE | Freq: Once | ORAL | Status: DC
  Filled 2013-12-19: qty 2

## 2013-12-19 MED ORDER — DICYCLOMINE HCL 10 MG/ML IM SOLN
20.0000 mg | Freq: Once | INTRAMUSCULAR | Status: AC
  Administered 2013-12-19: 20 mg via INTRAMUSCULAR
  Filled 2013-12-19: qty 2

## 2013-12-19 MED ORDER — DIAZEPAM 5 MG PO TABS
5.0000 mg | ORAL_TABLET | Freq: Once | ORAL | Status: AC
  Administered 2013-12-19: 5 mg via ORAL
  Filled 2013-12-19: qty 1

## 2013-12-19 MED ORDER — NITROGLYCERIN 0.4 MG SL SUBL: SUBLINGUAL_TABLET | SUBLINGUAL | Status: DC

## 2013-12-19 MED ORDER — GI COCKTAIL ~~LOC~~
30.0000 mL | Freq: Once | ORAL | Status: AC
  Administered 2013-12-19: 30 mL via ORAL
  Filled 2013-12-19: qty 30

## 2013-12-19 MED ORDER — FENTANYL CITRATE 0.05 MG/ML IJ SOLN: 50.0000 ug | Freq: Once | INTRAMUSCULAR | Status: DC

## 2013-12-19 NOTE — ED Notes (Signed)
Pt reports having "esophageal spasms" where it gets tight in her epigastric/midsternal area of chest, she becomes diaphoretic, nauseated; reports this has been going on for a while and has see GI and cards; took nitro and has helped pain

## 2013-12-19 NOTE — ED Notes (Signed)
Pt back from X-ray.  

## 2013-12-19 NOTE — ED Notes (Signed)
Discharge instructions reviewed with pt. Pt verbalized understanding.   

## 2013-12-19 NOTE — Discharge Instructions (Signed)
Esophageal Spasm °Esophageal spasm is an uncoordinated contraction of the muscles of the esophagus (the tube which carries food from your mouth to your stomach). Normally, the muscles of the esophagus alternate between contraction and relaxation starting from the top of the esophagus and working down to the bottom. This moves the food from the mouth to the stomach. In esophageal spasm, all the muscles contract at once. This causes pain and fails to move the food along. As a result, you may have trouble swallowing.  °Women are more likely than men to have esophageal spasm. The cause of the spasms is not known. Sometimes eating hot or cold foods triggers the condition and this may be due to an overly sensitive esophagus. This is not an infectious disease and cannot be passed to others. °SYMPTOMS  °Symptoms of esophageal spasm may include: chest pain, burning or pain with swallowing, and difficulty swallowing.  °DIAGNOSIS  °Esophageal spasm can be diagnosed by a test called manometry (pressure studies of the esophagus). In this test, a special tube is inserted down the esophagus. The tube measures the muscle activity of the esophagus. Abnormal contractions mixed with normal movement helps confirm the diagnosis.  °A person with a hypersensitive esophagus may be diagnosed by inflating a long balloon in the person's esophagus. If this causes the same symptoms, preventive methods may work. °PREVENTION  °Avoid hot or cold foods if that seems to be a trigger. °PROGNOSIS  °This condition does not go away, nor is treatment entirely satisfactory. Patients need to be careful of what they eat. They need to continue on medication if a useful one is found. Fortunately, the condition does not get progressively worse as time passes. °Esophageal spasm does not usually lead to more serious problems but sometimes the pain can be disabling. If a person becomes afraid to eat they may become malnourished and lose weight.  °TREATMENT  °· A  procedure in which instruments of increasing size are inserted through the esophagus to enlarge (dilate) it are used. °· Medications that decrease acid-production of the stomach may be used such as proton-pump inhibitors or H2-blockers. °· Medications of several types can be used to relax the muscles of the esophagus. °· An individual with a hypersensitive esophagus sometimes improves with low doses of medications normally used for depression. °· No treatment for esophageal spasm is effective for everyone. Often several approaches will be tried before one works. In many cases, the symptoms will improve, but will not go away completely. °· For severe cases, relief is obtained two-thirds of the time by cutting the muscles along the entire length of the esophagus. This is a major surgical procedure. °· Your symptoms are usually the best guide to how well the treatment for esophageal spasm works. °SIDE EFFECTS OF TREATMENTS °· Nitrates can cause headaches and low blood pressure. °· Calcium channel blockers can cause: °¨ Feeling sick to your stomach (nausea). °¨ Constipation and other side effects. °· Antidepressants can cause side effects that depend on the medication used. °HOME CARE INSTRUCTIONS  °· Let your caregiver know if problems are getting worse, or if you get food stuck in your esophagus for longer than 1 hour or as directed and are unable to swallow liquid. °· Take medications as directed and with permission of your caregiver. Ask about what to do if a medication seems to get stuck in your esophagus. Only take over-the-counter or prescription medicines for pain, discomfort, or fever as directed by your caregiver. °· Soft and liquid foods   pass more easily than solid pieces. SEEK IMMEDIATE MEDICAL CARE IF:   You develop severe chest pain, especially if the pain is crushing or pressure-like and spreads to the arms, back, neck, or jaw, or if you have sweating, nausea, or shortness of breath. THIS COULD BE AN  EMERGENCY. Do not wait to see if the pain will go away. Get medical help at once. Call 911 or [phone] (operator). DO NOT drive yourself to the hospital.  Your chest pain gets worse and does not go away with rest.  You have an attack of chest pain lasting longer than usual despite rest and treatment with the medications your physician has prescribed.  You wake from sleep with chest pain or shortness of breath.  You feel dizzy or faint.  You have chest pain, not typical of your usual pain, caused by your esophagus for which you originally saw your caregiver. MAKE SURE YOU:   Understand these instructions.  Will watch your condition.  Will get help right away if you are not doing well or get worse. Document Released: 08/12/2002 Document Revised: 08/14/2011 Document Reviewed: 02/25/2008 [facility] Patient Information 2015 [place], [place]. This information is not intended to replace advice given to you by your health care provider. Make sure you discuss any questions you have with your health care provider.

## 2013-12-19 NOTE — ED Notes (Signed)
Dr Otter at bedside  

## 2013-12-19 NOTE — ED Provider Notes (Signed)
CSN: [id number]     Arrival date & time 12/19/13  0035 History   First MD Initiated Contact with Patient 12/19/13 0158     Chief Complaint  Patient presents with  . Chest Pain     (Consider location/radiation/quality/duration/timing/severity/associated sxs/prior Treatment) HPI 48 year old female presents to the emergency room from home with complaint of chest tightness.  Patient has had similar episodes in the past, diagnosed with esophageal spasms.  Pain starts in the center of her chest radiates down into her epigastrium and up into her throat.  She gets nauseated and diaphoretic.  Patient has had a thorough workup from GI cards, she is awaiting followup with the esophageal specialist.  She reports she's having increasing symptoms over the last 3 days.  He today symptoms have been worse.  Patient reports she's only having relief when she takes nitroglycerin.  She denies any vomiting, noted fever or chills a shortness of breath no leg swelling. Past Medical History  Diagnosis Date  . HTN (hypertension)   . Seizure disorder     a. since childhood. b. s/p vagal nerve stimulator at [facility].  . Migraine   . Asthma   . Hypothyroid   . Obesity   . MI (myocardial infarction)   . Complication of anesthesia     hard to wake up-had to be reminded to breath  . Hx of cardiovascular stress test     Lex Myoview 8/14:  Normal, EF 74%  . Chronic chest pain     a. Abnl stress [place] 2008, f/u cath reportedly nl. b. ETT-Myoview 04/2011 - EKG changes but normal perfusion. Cor CT - no coronary calcium, no definite stenosis though mRCA not fully evaluated. c. 10/2011 - tn elevated in [place], LHC without CAD. Started on Ranexa, anti-anginals ?microvascular dz but later stopped while in hospital on abx.  Marland Kitchen MRSA infection     a. After vagal nerve stimulator at [facility] - surgical site MRSA infection, PICC placed.  . Biliary dyskinesia     a. s/p cholecystectomy.  Marland Kitchen GERD (gastroesophageal reflux disease)     a. Severe.   Past Surgical History  Procedure Laterality Date  . Cardiac catheterization      [facility]   . Cesarean section      placement of vagal nerve stimulator.  . Implantation vagal nerve stimulator  [id number]    battery [facility]  . Cholecystectomy N/A 01/24/2013    Procedure: LAPAROSCOPIC CHOLECYSTECTOMY;  Surgeon: Harl Bowie, MD;  Location: [facility];  Service: General;  Laterality: N/A;   Family History  Problem Relation Age of Onset  . Coronary artery disease Mother 62  . Coronary artery disease Maternal Grandmother   . Cancer Other   . Hypertension Other   . Stroke Other    History  Substance Use Topics  . Smoking status: Never Smoker   . Smokeless tobacco: Never Used  . Alcohol Use: 0.0 oz/week    1-3 Glasses of wine per week     Comment: occasional   OB History   Grav Para Term Preterm Abortions TAB SAB Ect Mult Living                 Review of Systems  See History of Present Illness; otherwise all other systems are reviewed and negative   Allergies  Amantadines; Amitriptyline; Amlodipine; Aspirin; Latex; and Simvastatin  Home Medications   Prior to Admission medications   Medication Sig Start Date End Date Taking? Authorizing Provider  Coenzyme Q10 200 MG TABS Take 1 tablet by mouth every evening.  02/07/12  Yes Larey Dresser, MD  Cyanocobalamin (VITAMIN B-12 IJ) Inject 1,000 mcg as directed every 28 (twenty-eight) days.   Yes Historical Provider, MD  dexlansoprazole (DEXILANT) 60 MG capsule Take 60 mg by mouth daily.   Yes Historical Provider, MD  furosemide (LASIX) 20 MG tablet Take 1 tablet (20 mg total) by mouth daily as needed for edema. 04/29/13  Yes Larey Dresser, MD  isosorbide mononitrate (IMDUR) 60 MG 24 hr tablet Take 60 mg by mouth 2 (two) times daily.   Yes Historical Provider, MD  levETIRAcetam (KEPPRA) 500 MG tablet Take 500-1,000 mg by mouth 2 (two) times daily. 1000mg  in the morning and 500mg   in the evening   Yes Historical Provider, MD  levothyroxine (SYNTHROID, LEVOTHROID) 50 MCG tablet Take 50 mcg by mouth daily.    Yes Historical Provider, MD  methocarbamol (ROBAXIN) 500 MG tablet Take 1 tablet (500 mg total) by mouth 2 (two) times daily. 10/10/13  Yes Joaquim Lai C. Sanford, PA-C  metoprolol succinate (TOPROL-XL) 25 MG 24 hr tablet Take 12.5 mg by mouth 2 (two) times daily.   Yes Historical Provider, MD  nitroGLYCERIN (NITROSTAT) 0.4 MG SL tablet Place 1 tablet (0.4 mg total) under the tongue every 5 (five) minutes as needed for chest pain. 07/02/13  Yes Dayna N Dunn, PA-C  olmesartan (BENICAR) 20 MG tablet Take 20 mg by mouth daily.   Yes Historical Provider, MD  topiramate (TOPAMAX) 100 MG tablet Take 200 mg by mouth 2 (two) times daily.   Yes Historical Provider, MD  nitroGLYCERIN (NITROSTAT) 0.4 MG SL tablet 0.4 mg tab sl 30 min prior to meals and at bedtime 12/19/13   Kalman Drape, MD   BP 107/67  Pulse 71  Temp(Src) 98.1 F (36.7 C) (Oral)  Resp 17  Ht 5' 5.5" (1.664 m)  SpO2 98%  LMP 11/18/2013 Physical Exam  Nursing note and vitals reviewed. Constitutional: She is oriented to person, place, and time. She appears well-developed and well-nourished. She appears distressed (uncomfortable appearing).  HENT:  Head: Normocephalic and atraumatic.  Nose: Nose normal.  Mouth/Throat: Oropharynx is clear and moist.  Eyes: Conjunctivae and EOM are normal. Pupils are equal, round, and reactive to light.  Neck: Normal range of motion. Neck supple. No JVD present. No tracheal deviation present. No thyromegaly present.  Cardiovascular: Normal rate, regular rhythm, normal heart sounds and intact distal pulses.  Exam reveals no gallop and no friction rub.   No murmur heard. Pulmonary/Chest: Effort normal and breath sounds normal. No stridor. No respiratory distress. She has no wheezes. She has no rales. She exhibits no tenderness.  Abdominal: Soft. Bowel sounds are normal. She exhibits no  distension and no mass. There is no tenderness. There is no rebound and no guarding.  Musculoskeletal: Normal range of motion. She exhibits no edema and no tenderness.  Lymphadenopathy:    She has no cervical adenopathy.  Neurological: She is alert and oriented to person, place, and time. She exhibits normal muscle tone. Coordination normal.  Skin: Skin is warm and dry. No rash noted. No erythema. No pallor.  Psychiatric: She has a normal mood and affect. Her behavior is normal. Judgment and thought content normal.    ED Course  Procedures (including critical care time) Labs Review Labs Reviewed  BASIC METABOLIC PANEL - Abnormal; Notable for the following:    GFR calc non Af Amer 71 (*)  GFR calc Af Amer 82 (*)    Anion gap 16 (*)    All other components within normal limits  CBC  I-STAT TROPOININ, ED    Imaging Review Dg Chest 2 View  12/19/2013   CLINICAL DATA:  Chest pain and difficulty breathing  EXAM: CHEST  2 VIEW  COMPARISON:  Oct 09, 2013  FINDINGS: The lungs are clear. Heart size pulmonary vascularity are normal. No adenopathy. No pneumothorax. No bone lesions. There is a stimulator over the upper left hemi thorax with lead tips extending to the level of C7-T1 on the left. This appearance is stable.  IMPRESSION: Left-sided stimulator.  Lungs clear.   Electronically Signed   By: Lowella Grip M.D.   On: 12/19/2013 01:41     EKG Interpretation None      Date: 12/19/2013  Rate: 90  Rhythm: normal sinus rhythm  QRS Axis: normal  Intervals: normal  ST/T Wave abnormalities: normal  Conduction Disutrbances:none  Narrative Interpretation: LVH   Ol a mored EKG Reviewed: unchanged    MDM   Final diagnoses:  Chronic chest pain  Esophageal spasm    48 year old female with chronic chest pain, no signs of ischemia on exam.  Symptoms are similar to prior noncardiac chest pain.  No improvement with Reglan Bentyl and Valium.  Patient always improves with nitroglycerin.   She has followup with GI specialist.  We'll schedule her nitroglycerin before meals and before bed.  Patient is to followup with her doctors    Kalman Drape, MD 12/20/13 [phone]

## 2013-12-22 NOTE — Progress Notes (Signed)
ED CM received call from [facility] for prescription clarification. Prescription clarified.

## 2014-01-21 ENCOUNTER — Other Ambulatory Visit: Payer: Self-pay | Admitting: Physician Assistant

## 2014-01-30 DIAGNOSIS — R1314 Dysphagia, pharyngoesophageal phase: Secondary | ICD-10-CM | POA: Insufficient documentation

## 2014-03-04 ENCOUNTER — Other Ambulatory Visit: Payer: Self-pay | Admitting: Physician Assistant

## 2014-03-06 ENCOUNTER — Encounter (HOSPITAL_COMMUNITY): Payer: Self-pay | Admitting: Emergency Medicine

## 2014-03-06 ENCOUNTER — Emergency Department (HOSPITAL_COMMUNITY)
Admission: EM | Admit: 2014-03-06 | Discharge: 2014-03-07 | Disposition: A | Discharge status: Home / Self Care | Payer: Medicare Other | Attending: Emergency Medicine | Admitting: Emergency Medicine

## 2014-03-06 DIAGNOSIS — E039 Hypothyroidism, unspecified: Secondary | ICD-10-CM | POA: Insufficient documentation

## 2014-03-06 DIAGNOSIS — Z8614 Personal history of Methicillin resistant Staphylococcus aureus infection: Secondary | ICD-10-CM | POA: Diagnosis not present

## 2014-03-06 DIAGNOSIS — K219 Gastro-esophageal reflux disease without esophagitis: Secondary | ICD-10-CM | POA: Insufficient documentation

## 2014-03-06 DIAGNOSIS — R21 Rash and other nonspecific skin eruption: Secondary | ICD-10-CM | POA: Diagnosis present

## 2014-03-06 DIAGNOSIS — G40909 Epilepsy, unspecified, not intractable, without status epilepticus: Secondary | ICD-10-CM | POA: Diagnosis not present

## 2014-03-06 DIAGNOSIS — I252 Old myocardial infarction: Secondary | ICD-10-CM | POA: Insufficient documentation

## 2014-03-06 DIAGNOSIS — Z91040 Latex allergy status: Secondary | ICD-10-CM | POA: Diagnosis not present

## 2014-03-06 DIAGNOSIS — E669 Obesity, unspecified: Secondary | ICD-10-CM | POA: Insufficient documentation

## 2014-03-06 DIAGNOSIS — I10 Essential (primary) hypertension: Secondary | ICD-10-CM | POA: Insufficient documentation

## 2014-03-06 DIAGNOSIS — J45909 Unspecified asthma, uncomplicated: Secondary | ICD-10-CM | POA: Diagnosis not present

## 2014-03-06 DIAGNOSIS — Z79899 Other long term (current) drug therapy: Secondary | ICD-10-CM | POA: Diagnosis not present

## 2014-03-06 DIAGNOSIS — G8929 Other chronic pain: Secondary | ICD-10-CM | POA: Diagnosis not present

## 2014-03-06 DIAGNOSIS — L237 Allergic contact dermatitis due to plants, except food: Secondary | ICD-10-CM

## 2014-03-06 DIAGNOSIS — Z9889 Other specified postprocedural states: Secondary | ICD-10-CM | POA: Diagnosis not present

## 2014-03-06 DIAGNOSIS — L255 Unspecified contact dermatitis due to plants, except food: Secondary | ICD-10-CM | POA: Insufficient documentation

## 2014-03-06 DIAGNOSIS — G43909 Migraine, unspecified, not intractable, without status migrainosus: Secondary | ICD-10-CM | POA: Insufficient documentation

## 2014-03-06 HISTORY — DX: Unspecified convulsions: R56.9

## 2014-03-06 MED ORDER — PREDNISONE 20 MG PO TABS: ORAL_TABLET | ORAL | Status: DC

## 2014-03-06 MED ORDER — DIPHENHYDRAMINE HCL 25 MG PO TABS: 25.0000 mg | ORAL_TABLET | Freq: Four times a day (QID) | ORAL | Status: DC

## 2014-03-06 NOTE — ED Notes (Signed)
Patient here with diffuse rash on arms which started last Sunday. Patient states that she was outside over the weekend and thought perhaps it may be poison ivy. Rash appears as small bumps, no blistering or drainage noted. Patient states that the rash burns and itches.

## 2014-03-06 NOTE — Discharge Instructions (Signed)
Contact Dermatitis Contact dermatitis is a reaction to certain substances that touch the skin. Contact dermatitis can be either irritant contact dermatitis or allergic contact dermatitis. Irritant contact dermatitis does not require previous exposure to the substance for a reaction to occur.Allergic contact dermatitis only occurs if you have been exposed to the substance before. Upon a repeat exposure, your body reacts to the substance.  CAUSES  Many substances can cause contact dermatitis. Irritant dermatitis is most commonly caused by repeated exposure to mildly irritating substances, such as:  Makeup.  Soaps.  Detergents.  Bleaches.  Acids.  Metal salts, such as nickel. Allergic contact dermatitis is most commonly caused by exposure to:  Poisonous plants.  Chemicals (deodorants, shampoos).  Jewelry.  Latex.  Neomycin in triple antibiotic cream.  Preservatives in products, including clothing. SYMPTOMS  The area of skin that is exposed may develop:  Dryness or flaking.  Redness.  Cracks.  Itching.  Pain or a burning sensation.  Blisters. With allergic contact dermatitis, there may also be swelling in areas such as the eyelids, mouth, or genitals.  DIAGNOSIS  Your caregiver can usually tell what the problem is by doing a physical exam. In cases where the cause is uncertain and an allergic contact dermatitis is suspected, a patch skin test may be performed to help determine the cause of your dermatitis. TREATMENT Treatment includes protecting the skin from further contact with the irritating substance by avoiding that substance if possible. Barrier creams, powders, and gloves may be helpful. Your caregiver may also recommend:  Steroid creams or ointments applied 2 times daily. For best results, soak the rash area in cool water for 20 minutes. Then apply the medicine. Cover the area with a plastic wrap. You can store the steroid cream in the refrigerator for a "chilly"  effect on your rash. That may decrease itching. Oral steroid medicines may be needed in more severe cases.  Antibiotics or antibacterial ointments if a skin infection is present.  Antihistamine lotion or an antihistamine taken by mouth to ease itching.  Lubricants to keep moisture in your skin.  Burow's solution to reduce redness and soreness or to dry a weeping rash. Mix one packet or tablet of solution in 2 cups cool water. Dip a clean washcloth in the mixture, wring it out a bit, and put it on the affected area. Leave the cloth in place for 30 minutes. Do this as often as possible throughout the day.  Taking several cornstarch or baking soda baths daily if the area is too large to cover with a washcloth. Harsh chemicals, such as alkalis or acids, can cause skin damage that is like a burn. You should flush your skin for 15 to 20 minutes with cold water after such an exposure. You should also seek immediate medical care after exposure. Bandages (dressings), antibiotics, and pain medicine may be needed for severely irritated skin.  HOME CARE INSTRUCTIONS  Avoid the substance that caused your reaction.  Keep the area of skin that is affected away from hot water, soap, sunlight, chemicals, acidic substances, or anything else that would irritate your skin.  Do not scratch the rash. Scratching may cause the rash to become infected.  You may take cool baths to help stop the itching.  Only take over-the-counter or prescription medicines as directed by your caregiver.  See your caregiver for follow-up care as directed to make sure your skin is healing properly. SEEK MEDICAL CARE IF:   Your condition is not better after 3  days of treatment.  You seem to be getting worse.  You see signs of infection such as swelling, tenderness, redness, soreness, or warmth in the affected area.  You have any problems related to your medicines. Document Released: 05/19/2000 Document Revised: 08/14/2011  Document Reviewed: 10/25/2010 [employer] Patient Information 2015 [place], [place]. This information is not intended to replace advice given to you by your health care provider. Make sure you discuss any questions you have with your health care provider.  Poison [facility] is a rash caused by touching the leaves of the poison oak plant. You may have a rash with redness and itching. Sometimes, blisters appear and break open. Your eyes may get puffy (swollen). Poison oak often heals in 2 to 3 weeks without treatment.  HOME CARE  If you touch poison oak:  Wash your skin with soap and water right away. Wash under your fingernails. Do not rub the skin very hard.  Wash any clothes you were wearing.  Avoid poison oak in the future. Poison oak usually has 3 leaves on a stem.  Use medicines to help with itching as told by your doctor. Do not drive when you take this medicine.  Keep open sores dry, clean, and covered with a bandage and medicated cream, if needed.  Ask your doctor about medicine for children. GET HELP RIGHT AWAY IF:  You have open sores.  Redness spreads beyond the area of the rash.  There is yellowish white fluid (pus) coming from the rash.  Pain gets worse.  You have a temperature by mouth above 102 F (38.9 C), not controlled by medicine. MAKE SURE YOU:  Understand these instructions.  Will watch your condition.  Will get help right away if you are not doing well or get worse. Document Released: 06/24/2010 Document Revised: 08/14/2011 Document Reviewed: 06/24/2010 [facility] Patient Information 2015 [place], [place]. This information is not intended to replace advice given to you by your health care provider. Make sure you discuss any questions you have with your health care provider.

## 2014-03-06 NOTE — ED Provider Notes (Signed)
CSN: [id number]     Arrival date & time 03/06/14  2257 History  This chart was scribed for non-physician practitioner, Domenic Moras, PA-C,working with [name], MD, by Marlowe Kays, ED Scribe. This patient was seen in room TR04C/TR04C and the patient's care was started at 11:33 PM.  Chief Complaint  Patient presents with  . Rash   The history is provided by the patient. No language interpreter was used.   HPI Comments:  [name] is a 48 y.o. obese female with PMH of seizures, hypothyroidism, asthma, MI and MRSA who presents to the Emergency Department complaining of a worsening burning, itching rash to bilateral upper and lower extremities, neck and upper chest that began about 5 days ago. She states she had done some yard work about 7 days ago and may have been exposed to poison ivy. She denies any new creams, soaps, perfumes, deodorants, foods or animal exposures. She has applied "poison ivy cream" and taken Benadryl and soaked in a vinegar bath. She denies contact with anyone else with similar symptoms. She denies h/o DM. She denies SOB, cough, fever or chills.   Past Medical History  Diagnosis Date  . HTN (hypertension)   . Seizure disorder     a. since childhood. b. s/p vagal nerve stimulator at [facility].  . Migraine   . Asthma   . Hypothyroid   . Obesity   . MI (myocardial infarction)   . Complication of anesthesia     hard to wake up-had to be reminded to breath  . Hx of cardiovascular stress test     Lex Myoview 8/14:  Normal, EF 74%  . Chronic chest pain     a. Abnl stress [place] 2008, f/u cath reportedly nl. b. ETT-Myoview 04/2011 - EKG changes but normal perfusion. Cor CT - no coronary calcium, no definite stenosis though mRCA not fully evaluated. c. 10/2011 - tn elevated in [place], LHC without CAD. Started on Ranexa, anti-anginals ?microvascular dz but later stopped while in hospital on abx.  Marland Kitchen MRSA infection     a. After vagal nerve stimulator at [facility] -  surgical site MRSA infection, PICC placed.  . Biliary dyskinesia     a. s/p cholecystectomy.  Marland Kitchen GERD (gastroesophageal reflux disease)     a. Severe.  . Seizures    Past Surgical History  Procedure Laterality Date  . Cardiac catheterization      [facility]   . Cesarean section      placement of vagal nerve stimulator.  . Implantation vagal nerve stimulator  [id number]    battery [facility]g-[facility]  . Cholecystectomy N/A 01/24/2013    Procedure: LAPAROSCOPIC CHOLECYSTECTOMY;  Surgeon: Harl Alexsandra Shontz, MD;  Location: [facility];  Service: General;  Laterality: N/A;   Family History  Problem Relation Age of Onset  . Coronary artery disease Mother 42  . Coronary artery disease Maternal Grandmother   . Cancer Other   . Hypertension Other   . Stroke Other    History  Substance Use Topics  . Smoking status: Never Smoker   . Smokeless tobacco: Never Used  . Alcohol Use: 0.0 oz/week    1-3 Glasses of wine per week     Comment: occasional   OB History   Grav Para Term Preterm Abortions TAB SAB Ect Mult Living                 Review of Systems  Constitutional: Negative for fever.  HENT:  Negative for mouth sores.   Gastrointestinal: Negative for nausea.  Skin: Positive for rash.  All other systems reviewed and are negative.   Allergies  Amantadines; Amitriptyline; Amlodipine; Aspirin; Latex; and Simvastatin  Home Medications   Prior to Admission medications   Medication Sig Start Date End Date Taking? Authorizing Provider  BENICAR 20 MG tablet TAKE 1 TABLET (20 MG TOTAL) BY MOUTH DAILY. 03/04/14   Larey Dresser, MD  Coenzyme Q10 200 MG TABS Take 1 tablet by mouth every evening.  02/07/12   Larey Dresser, MD  Cyanocobalamin (VITAMIN B-12 IJ) Inject 1,000 mcg as directed every 28 (twenty-eight) days.    Historical Provider, MD  dexlansoprazole (DEXILANT) 60 MG capsule Take 60 mg by mouth daily.    Historical Provider, MD  furosemide (LASIX) 20 MG  tablet Take 1 tablet (20 mg total) by mouth daily as needed for edema. 04/29/13   Larey Dresser, MD  isosorbide mononitrate (IMDUR) 60 MG 24 hr tablet Take 60 mg by mouth 2 (two) times daily.    Historical Provider, MD  levETIRAcetam (KEPPRA) 500 MG tablet Take 500-1,000 mg by mouth 2 (two) times daily. 1000mg  in the morning and 500mg  in the evening    Historical Provider, MD  levothyroxine (SYNTHROID, LEVOTHROID) 50 MCG tablet Take 50 mcg by mouth daily.     Historical Provider, MD  methocarbamol (ROBAXIN) 500 MG tablet Take 1 tablet (500 mg total) by mouth 2 (two) times daily. 10/10/13   Idalia Needle. Sanford, PA-C  metoprolol succinate (TOPROL-XL) 25 MG 24 hr tablet Take 12.5 mg by mouth 2 (two) times daily.    Historical Provider, MD  nitroGLYCERIN (NITROSTAT) 0.4 MG SL tablet 0.4 mg tab sl 30 min prior to meals and at bedtime 12/19/13   Kalman Drape, MD  NITROSTAT 0.4 MG SL tablet PLACE 1 TABLET (0.4 MG TOTAL) UNDER THE TONGUE EVERY 5 (FIVE) MINUTES AS NEEDED FOR CHEST PAIN. 01/22/14   Larey Dresser, MD  topiramate (TOPAMAX) 100 MG tablet Take 200 mg by mouth 2 (two) times daily.    Historical Provider, MD   Triage Vitals: BP 157/92  Pulse 78  Temp(Src) 97.9 F (36.6 C) (Oral)  Resp 16  Ht 5\' 5"  (1.651 m)  Wt 223 lb (101.152 kg)  BMI 37.11 kg/m2  SpO2 99%  LMP 02/04/2014 Physical Exam  Nursing note and vitals reviewed. Constitutional: She is oriented to person, place, and time. She appears well-developed and well-nourished.  HENT:  Head: Normocephalic and atraumatic.  Mouth/Throat: Uvula is midline, oropharynx is clear and moist and mucous membranes are normal.  Eyes: EOM are normal.  Neck: Normal range of motion.  Cardiovascular: Normal rate, regular rhythm and normal heart sounds.  Exam reveals no gallop and no friction rub.   No murmur heard. Pulmonary/Chest: Effort normal and breath sounds normal. No respiratory distress. She has no wheezes. She has no rales.  Musculoskeletal:  Normal range of motion.  Neurological: She is alert and oriented to person, place, and time.  Skin: Skin is warm and dry. Rash noted.  Multiple maculopapular rash noted to upper chest, BUE, lower legs, and around inferior orbital region without obvious eye involvement. No petechiae, no pustular lesions, no vesicular lesions. Rash spares palms of hands and soles of feet.  Psychiatric: She has a normal mood and affect. Her behavior is normal.    ED Course  Procedures (including critical care time) DIAGNOSTIC STUDIES: Oxygen Saturation is 99% on RA, normal by my  interpretation.   COORDINATION OF CARE: 11:39 PM- Suspect contact dermatitis likely from poison oak given that the rash is overlying skin exposed region.  No systemic involvement.  Will prescribe steroids and advised pt to continue Benadryl. Will give dermatology referral for worsening and persistent symptoms. Pt verbalizes understanding and agrees to plan.  Medications - No data to display  Labs Review Labs Reviewed - No data to display  Imaging Review No results found.   EKG Interpretation None      MDM   Final diagnoses:  Contact dermatitis due to poison oak    BP 157/92  Pulse 78  Temp(Src) 97.9 F (36.6 C) (Oral)  Resp 16  Ht 5\' 5"  (1.651 m)  Wt 223 lb (101.152 kg)  BMI 37.11 kg/m2  SpO2 99%  LMP 02/04/2014    I personally performed the services described in this documentation, which was scribed in my presence. The recorded information has been reviewed and is accurate.    Domenic Moras, PA-C 03/06/14 2348

## 2014-03-07 NOTE — ED Provider Notes (Signed)
Medical screening examination/treatment/procedure(s) were performed by non-physician practitioner and as supervising physician I was immediately available for consultation/collaboration.    [name], MD 03/07/14 [phone]

## 2014-04-13 ENCOUNTER — Other Ambulatory Visit: Payer: Self-pay | Admitting: Cardiology

## 2014-05-02 ENCOUNTER — Other Ambulatory Visit: Payer: Self-pay | Admitting: Physician Assistant

## 2014-05-04 DIAGNOSIS — J4550 Severe persistent asthma, uncomplicated: Secondary | ICD-10-CM | POA: Insufficient documentation

## 2014-05-04 DIAGNOSIS — J309 Allergic rhinitis, unspecified: Secondary | ICD-10-CM | POA: Insufficient documentation

## 2014-06-01 ENCOUNTER — Other Ambulatory Visit: Payer: Self-pay | Admitting: Cardiology

## 2014-06-08 NOTE — Telephone Encounter (Signed)
She should schedule a follow up appt with Dr McLean/PA/NP for Dr [name] to continue to refill her medications.  Once she has an appt, she can be given enough medication refills to last until her appointment.

## 2014-06-11 ENCOUNTER — Other Ambulatory Visit: Payer: Self-pay

## 2014-06-11 MED ORDER — NITROGLYCERIN 0.4 MG SL SUBL: SUBLINGUAL_TABLET | SUBLINGUAL | Status: DC

## 2014-06-19 ENCOUNTER — Encounter: Payer: Self-pay | Admitting: Physician Assistant

## 2014-06-19 ENCOUNTER — Ambulatory Visit (INDEPENDENT_AMBULATORY_CARE_PROVIDER_SITE_OTHER): Payer: Medicare Other | Admitting: Physician Assistant

## 2014-06-19 VITALS — BP 147/70 | HR 103 | Ht 65.0 in | Wt 244.0 lb

## 2014-06-19 DIAGNOSIS — R079 Chest pain, unspecified: Secondary | ICD-10-CM

## 2014-06-19 DIAGNOSIS — G8929 Other chronic pain: Secondary | ICD-10-CM

## 2014-06-19 DIAGNOSIS — Z01818 Encounter for other preprocedural examination: Secondary | ICD-10-CM

## 2014-06-19 DIAGNOSIS — E785 Hyperlipidemia, unspecified: Secondary | ICD-10-CM

## 2014-06-19 DIAGNOSIS — I10 Essential (primary) hypertension: Secondary | ICD-10-CM

## 2014-06-19 DIAGNOSIS — I5032 Chronic diastolic (congestive) heart failure: Secondary | ICD-10-CM

## 2014-06-19 DIAGNOSIS — R0602 Shortness of breath: Secondary | ICD-10-CM

## 2014-06-19 LAB — BRAIN NATRIURETIC PEPTIDE: Brain Natriuretic Peptide: 2.6 pg/mL (ref 0.0–100.0)

## 2014-06-19 LAB — TSH: TSH: 0.783 u[IU]/mL (ref 0.350–4.500)

## 2014-06-19 MED ORDER — METOPROLOL SUCCINATE ER 25 MG PO TB24: 25.0000 mg | ORAL_TABLET | Freq: Two times a day (BID) | ORAL | Status: DC

## 2014-06-19 NOTE — Assessment & Plan Note (Signed)
She is not on a satin secondary to myalgias

## 2014-06-19 NOTE — Assessment & Plan Note (Signed)
She appears euvolemic despite complaining of orthopnea. I think her resting dyspnea is related more to her lung function.  She is seeing pulmonary for this who recently started her on dual layer of. I will check a BNP  Today.

## 2014-06-19 NOTE — Patient Instructions (Signed)
Your physician has recommended you make the following change in your medication:  1) INCREASE Metoprolol to 25mg  twice daily. An Rx has been sent to your pharmacy  Lab Today: Bnp. Tsh  You are cleared from a cardiac standpoint to proceed with the EGD procedure  Your physician wants you to follow-up in: 6 months with Dr.Mclean You will receive a reminder letter in the mail two months in advance. If you don't receive a letter, please call our office to schedule the follow-up appointment.

## 2014-06-19 NOTE — Assessment & Plan Note (Signed)
She reports a 21 pound increase in her weight since October. She doesn't know when the last time her TSH was checked so we will do that today.

## 2014-06-19 NOTE — Assessment & Plan Note (Signed)
Left heart catheterization in 2013 in [place] showed  normal coronary arteries.   I think from a cardiac standpoint she'll be low risk for her up coming EGD

## 2014-06-19 NOTE — Assessment & Plan Note (Signed)
Blood pressure is elevated. We'll increase her dose of metoprolol 25 mg twice a day.

## 2014-06-19 NOTE — Progress Notes (Signed)
Patient ID: [name], female   DOB: [birth year], 49 y.o.   MRN: [id number]    Date:  06/19/2014   ID:  [name], DOB Jan 04, 1966, MRN [id number]  PCP:  Mackie Pai, MD  Primary Cardiologist:  [name]  Chief Complaint  Patient presents with  . Appointment    F/U VISIT     History of Present Illness: [name] is a 49 y.o. female with a history of chest pain and prior negative caths as well as migraines, asthma, and HTN. She has had chest pain chronically over a number of years. She had an abnormal stress test in [place] in 2008. She was then sent for a cardiac cath, which she says was normal. We had her do an ETT-myoview in this office in 11/12. This showed ischemic-type ST depression on exercise ECG, but there was no evidence for ischemia on myoview images. EF was 66%. Given ongoing chest pain and ischemic ECG changes with normal perfusion images (equivocal study), I had her get a coronary CT angiogram. This was a difficult study because we were not able to get her HR as low as would be ideal. However, she had no coronary calcium and no definite stenosis was seen (though mid-RCA was not fully evaluated due to artifact). I think that this was a negative study. She continued to have chest tightness with walking long distances, climbing a flight of steps, and housework. In 5/13, she went to the ER in [place] with particularly bad exertional chest pain that lasted a prolonged time. Troponin was mildly elevated with normal CKMB. She had a left heart cath in the hospital in [place] which showed no angiographic CAD. She was sent home on Imdur and amlodipine. Echo showed normal EF. 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 She stopped the amlodipine due to ankle swelling.  In 12/13, she had her vagal nerve stimulator replaced at [facility]. This was complicated by a surgical site MRSA infection, and she had  a PICC line placed for antibiotics. Ranolazine was stopped at [facility] while she was getting antibiotics. She did not notice much change in her chest pain pattern off ranolazine. She has had significant right arm pain since the PICC was removed and has had an ultrasound that did not show a DVT.  Patient has stopped her statin due to muscle pain. Her CPK level has been mildly elevated.   The patient was last seen by Dr. [name] in April 2015.   She is scheduled for an EGD and needs cardiac clearance.    Continues to have sporadic chest pain she also says that she's been sleeping in a chair she breathes better. Her weight is up 21 pounds since October however, she thinks that's mostly due to her thyroid.   She also reports shortness of breath it has been started on dulera.   Denies any lower extremity edema. Has chronic nausea The patient currently denies fever,  dizziness, PND, cough, congestion, abdominal pain, hematochezia, melena, lower  claudication.  Wt Readings from Last 3 Encounters:  06/19/14 244 lb (110.678 kg)  03/06/14 223 lb (101.152 kg)  07/06/13 236 lb 1.6 oz (107.094 kg)     Past Medical History  Diagnosis Date  . HTN (hypertension)   . Seizure disorder     a. since childhood. b. s/p vagal nerve stimulator at [facility].  . Migraine   . Asthma   . Hypothyroid   . Obesity   .  MI (myocardial infarction)   . Complication of anesthesia     hard to wake up-had to be reminded to breath  . Hx of cardiovascular stress test     Lex Myoview 8/14:  Normal, EF 74%  . Chronic chest pain     a. Abnl stress [place] 2008, f/u cath reportedly nl. b. ETT-Myoview 04/2011 - EKG changes but normal perfusion. Cor CT - no coronary calcium, no definite stenosis though mRCA not fully evaluated. c. 10/2011 - tn elevated in [place], LHC without CAD. Started on Ranexa, anti-anginals ?microvascular dz but later stopped while in hospital on abx.  Marland Kitchen MRSA infection     a. After vagal nerve stimulator at [facility] - surgical site MRSA infection, PICC placed.  . Biliary dyskinesia     a. s/p cholecystectomy.  Marland Kitchen GERD (gastroesophageal reflux disease)     a. Severe.  . Seizures     Current Outpatient Prescriptions  Medication Sig Dispense Refill  . BENICAR 20 MG tablet TAKE 1 TABLET (20 MG TOTAL) BY MOUTH DAILY. 30 tablet 0  . dexlansoprazole (DEXILANT) 60 MG capsule Take 60 mg by mouth daily.    . fluticasone (FLONASE) 50 MCG/ACT nasal spray Place 1 spray into the nose 2 (two) times daily. 1 SPRAY IN BOTH NOSTRILS BID    . furosemide (LASIX) 20 MG tablet Take 1 tablet (20 mg total) by mouth daily as needed for edema. 30 tablet 2  . isosorbide mononitrate (IMDUR) 60 MG 24 hr tablet Take 60 mg by mouth 2 (two) times daily.    Marland Kitchen levETIRAcetam (KEPPRA) 500 MG tablet Take 500-1,000 mg by mouth 2 (two) times daily. 1000mg  in the morning and 500mg  in the evening    . levothyroxine (SYNTHROID, LEVOTHROID) 50 MCG tablet Take 50 mcg by mouth daily.     . methocarbamol (ROBAXIN) 500 MG tablet Take 1 tablet (500 mg total) by mouth 2 (two) times daily. 40 tablet 0  . metoprolol succinate (TOPROL-XL) 25 MG 24 hr tablet Take 1 tablet (25 mg total) by mouth 2 (two) times daily. 60 tablet 5  . montelukast (SINGULAIR) 10 MG tablet Take 10 mg by mouth at bedtime. 1 TAB QHS    . nitroGLYCERIN (NITRODUR - DOSED IN MG/24 HR) 0.2 mg/hr patch Place 0.2 mg onto the skin daily.    . promethazine (PHENERGAN) 25 MG tablet Take 25 mg by mouth as needed. 1 TAB PO Q 4-6 HRS    . ranitidine (ZANTAC) 150 MG tablet Take 150 mg by mouth 2 (two) times daily.  11  . topiramate (TOPAMAX) 100 MG tablet Take 200 mg by mouth 2 (two) times daily.    . Coenzyme Q10 200 MG TABS Take 1 tablet by mouth every evening.   0  . Cyanocobalamin (VITAMIN B-12 IJ) Inject 1,000 mcg as directed every 28 (twenty-eight) days.    . diphenhydrAMINE (BENADRYL) 25 MG tablet Take 1 tablet (25 mg total) by mouth every 6 (six) hours. (Patient not taking:  Reported on 06/19/2014) 20 tablet 0  . nitroGLYCERIN (NITROSTAT) 0.4 MG SL tablet 0.4 mg tab sl 30 min prior to meals and at bedtime (Patient not taking: Reported on 06/19/2014) 25 tablet 3   No current facility-administered medications for this visit.    Allergies:    Allergies  Allergen Reactions  . Amantadines Swelling  . Amitriptyline Swelling  . Amlodipine Swelling  . Aspirin Nausea Only    Irritates stomach also  . Latex Hives, Itching and  Swelling    Burning also  . Simvastatin Swelling    Muscle pains also    Social History:  The patient  reports that she has never smoked. She has never used smokeless tobacco. She reports that she drinks alcohol. She reports that she does not use illicit drugs.   Family history:   Family History  Problem Relation Age of Onset  . Coronary artery disease Mother 46  . Coronary artery disease Maternal Grandmother   . Cancer Other   . Hypertension Other   . Stroke Other     ROS:  Please see the history of present illness.  All other systems reviewed and negative.   PHYSICAL EXAM: VS:  BP 147/70 mmHg  Pulse 103  Ht 5\' 5"  (1.651 m)  Wt 244 lb (110.678 kg)  BMI 40.60 kg/m2 Well nourished, well developed, in no acute distress HEENT: Pupils are equal round react to light accommodation extraocular movements are intact.  Neck: no JVDNo cervical lymphadenopathy. Cardiac: Regular rate and rhythm without murmurs rubs or gallops. Lungs:  clear to auscultation bilaterally, no wheezing, rhonchi or rales Abd: soft,  Mildly tender on the right side, positive bowel sounds all quadrants, no hepatosplenomegaly Ext: no lower extremity edema.  2+ radial and dorsalis pedis pulses. Skin: warm and dry Neuro:  Grossly normal  EKG:   Normal sinus rhythm with a rate of 96 bpm. Inferior T-wave inversions also in the 3 through 6 seen on prior EKG  ASSESSMENT AND PLAN:  Problem List Items Addressed This Visit    Hypertension     Blood pressure is  elevated. We'll increase her dose of metoprolol 25 mg twice a day.      Relevant Medications   fluticasone (FLONASE) 50 MCG/ACT nasal spray   montelukast (SINGULAIR) 10 MG tablet   promethazine (PHENERGAN) 25 MG tablet   ranitidine (ZANTAC) 150 MG tablet   nitroGLYCERIN (NITRODUR - DOSED IN MG/24 HR) 0.2 mg/hr patch   metoprolol succinate (TOPROL-XL) 24 hr tablet   Other Relevant Orders   EKG 12-Lead   TSH   B Nat Peptide   Hyperlipidemia     She is not on a satin secondary to myalgias      Relevant Medications   fluticasone (FLONASE) 50 MCG/ACT nasal spray   montelukast (SINGULAIR) 10 MG tablet   promethazine (PHENERGAN) 25 MG tablet   ranitidine (ZANTAC) 150 MG tablet   nitroGLYCERIN (NITRODUR - DOSED IN MG/24 HR) 0.2 mg/hr patch   metoprolol succinate (TOPROL-XL) 24 hr tablet   Other Relevant Orders   EKG 12-Lead   TSH   B Nat Peptide   Chronic diastolic heart failure - Primary     She appears euvolemic despite complaining of orthopnea. I think her resting dyspnea is related more to her lung function.  She is seeing pulmonary for this who recently started her on dual layer of. I will check a BNP  Today.      Relevant Medications   nitroGLYCERIN (NITRODUR - DOSED IN MG/24 HR) 0.2 mg/hr patch   metoprolol succinate (TOPROL-XL) 24 hr tablet   Chronic chest pain     Left heart catheterization in 2013 in [place] showed  normal coronary arteries.   I think from a cardiac standpoint she'll be low risk for her up coming EGD      Relevant Medications   fluticasone (FLONASE) 50 MCG/ACT nasal spray   montelukast (SINGULAIR) 10 MG tablet   promethazine (PHENERGAN) 25 MG tablet   ranitidine (  ZANTAC) 150 MG tablet   nitroGLYCERIN (NITRODUR - DOSED IN MG/24 HR) 0.2 mg/hr patch   metoprolol succinate (TOPROL-XL) 24 hr tablet   Other Relevant Orders   EKG 12-Lead   TSH   B Nat Peptide    Other Visit Diagnoses    Preprocedural examination        Relevant Medications     fluticasone (FLONASE) 50 MCG/ACT nasal spray    montelukast (SINGULAIR) 10 MG tablet    promethazine (PHENERGAN) 25 MG tablet    ranitidine (ZANTAC) 150 MG tablet    nitroGLYCERIN (NITRODUR - DOSED IN MG/24 HR) 0.2 mg/hr patch    metoprolol succinate (TOPROL-XL) 24 hr tablet    Other Relevant Orders    EKG 12-Lead    TSH    B Nat Peptide    SOB (shortness of breath)        Relevant Medications    fluticasone (FLONASE) 50 MCG/ACT nasal spray    montelukast (SINGULAIR) 10 MG tablet    promethazine (PHENERGAN) 25 MG tablet    ranitidine (ZANTAC) 150 MG tablet    nitroGLYCERIN (NITRODUR - DOSED IN MG/24 HR) 0.2 mg/hr patch    metoprolol succinate (TOPROL-XL) 24 hr tablet    Other Relevant Orders    EKG 12-Lead    TSH    B Nat Peptide

## 2014-07-03 ENCOUNTER — Other Ambulatory Visit: Payer: Self-pay | Admitting: Cardiology

## 2014-07-13 ENCOUNTER — Other Ambulatory Visit: Payer: Self-pay | Admitting: Cardiology

## 2014-08-07 ENCOUNTER — Telehealth: Payer: Self-pay | Admitting: Cardiology

## 2014-08-07 NOTE — Telephone Encounter (Signed)
Patient reports that over past week since she has been recovering from flu/virus, she is experiencing palpitations and other symptoms, including fatigue and increased SOB (pt is asthmatic). Patient verbalizes she is compliant with her medication and does not overdo the caffeine and does not take alcohol or OTC Goody's/Excedrin, etc. Patient does have an appointment to be seen on 08/26/14 with Melina Copa, NP.  Patient is concerned with what to do meanwhile. Provided education to patient regarding effects of caffeine/ETOH/exertion/stress. Patient also instructed on s/sx to watch for that would indicate she should proceed to Emergency Department or call 911 - such as CP, worsening SOB/dizziness/swelling or other cardiac symptoms. Patient advised to get BP checked as soon as possible and HR, as she is not sure how to take it. Patient also instructed that when she is experiencing the palpitations, she should rest and allow herself to decrease any present stress (as possible). Patient also instructed that she should not drive during these episodes due to concern that she could "pass out" and have an accident. Patient verbalized understanding and appreciation for the above information. She will also call back next week to see if a sooner appointment has became available. She will also notify us if her BP is abnormal and current status of palpitations.

## 2014-08-07 NOTE — Telephone Encounter (Signed)
New Message  Pt calling to speak w/ Rn about condition; per pt- feeling heart palpitations and weakness. Pt was given next PA/NP appt- 3/23 @ 1:45 w/ Dayna. Pt wants to know what to do going forward. Please call back and discuss.

## 2014-08-24 ENCOUNTER — Encounter: Payer: Self-pay | Admitting: Physician Assistant

## 2014-08-24 ENCOUNTER — Telehealth: Payer: Self-pay | Admitting: *Deleted

## 2014-08-24 ENCOUNTER — Ambulatory Visit (INDEPENDENT_AMBULATORY_CARE_PROVIDER_SITE_OTHER): Payer: Medicare Other | Admitting: Physician Assistant

## 2014-08-24 VITALS — BP 122/84 | HR 96 | Ht 65.0 in | Wt 239.1 lb

## 2014-08-24 DIAGNOSIS — R002 Palpitations: Secondary | ICD-10-CM | POA: Diagnosis not present

## 2014-08-24 DIAGNOSIS — R079 Chest pain, unspecified: Secondary | ICD-10-CM

## 2014-08-24 DIAGNOSIS — R0602 Shortness of breath: Secondary | ICD-10-CM

## 2014-08-24 DIAGNOSIS — G8929 Other chronic pain: Secondary | ICD-10-CM

## 2014-08-24 DIAGNOSIS — J45909 Unspecified asthma, uncomplicated: Secondary | ICD-10-CM | POA: Insufficient documentation

## 2014-08-24 DIAGNOSIS — I10 Essential (primary) hypertension: Secondary | ICD-10-CM

## 2014-08-24 DIAGNOSIS — K219 Gastro-esophageal reflux disease without esophagitis: Secondary | ICD-10-CM

## 2014-08-24 DIAGNOSIS — J4540 Moderate persistent asthma, uncomplicated: Secondary | ICD-10-CM

## 2014-08-24 LAB — CBC WITH DIFFERENTIAL/PLATELET
Basophils Absolute: 0 10*3/uL (ref 0.0–0.1)
Basophils Relative: 0.8 % (ref 0.0–3.0)
Eosinophils Absolute: 0.4 10*3/uL (ref 0.0–0.7)
Eosinophils Relative: 7.6 % — ABNORMAL HIGH (ref 0.0–5.0)
HCT: 38.6 % (ref 36.0–46.0)
Hemoglobin: 12.8 g/dL (ref 12.0–15.0)
Lymphocytes Relative: 49.4 % — ABNORMAL HIGH (ref 12.0–46.0)
Lymphs Abs: 2.4 10*3/uL (ref 0.7–4.0)
MCHC: 33 g/dL (ref 30.0–36.0)
MCV: 80.8 fl (ref 78.0–100.0)
Monocytes Absolute: 0.4 10*3/uL (ref 0.1–1.0)
Monocytes Relative: 9 % (ref 3.0–12.0)
Neutro Abs: 1.6 10*3/uL (ref 1.4–7.7)
Neutrophils Relative %: 33.2 % — ABNORMAL LOW (ref 43.0–77.0)
Platelets: 219 10*3/uL (ref 150.0–400.0)
RBC: 4.78 Mil/uL (ref 3.87–5.11)
RDW: 15.7 % — ABNORMAL HIGH (ref 11.5–15.5)
WBC: 4.9 10*3/uL (ref 4.0–10.5)

## 2014-08-24 LAB — TSH: TSH: 0.96 u[IU]/mL (ref 0.35–4.50)

## 2014-08-24 LAB — BASIC METABOLIC PANEL
BUN: 13 mg/dL (ref 6–23)
CO2: 24 mEq/L (ref 19–32)
Calcium: 9.5 mg/dL (ref 8.4–10.5)
Chloride: 109 mEq/L (ref 96–112)
Creatinine, Ser: 0.88 mg/dL (ref 0.40–1.20)
GFR: 87.29 mL/min (ref >60.00)
Glucose, Bld: 98 mg/dL (ref 70–99)
Potassium: 3.8 mEq/L (ref 3.5–5.1)
Sodium: 139 mEq/L (ref 135–145)

## 2014-08-24 LAB — MAGNESIUM: Magnesium: 1.9 mg/dL (ref 1.5–2.5)

## 2014-08-24 LAB — BRAIN NATRIURETIC PEPTIDE: Pro B Natriuretic peptide (BNP): 4 pg/mL (ref 0.0–100.0)

## 2014-08-24 NOTE — Telephone Encounter (Signed)
lmptcb to go over lab results 

## 2014-08-24 NOTE — Patient Instructions (Signed)
LAB WORK TODAY; BMET, MAG LEVEL, TSH, BNP, CBC W./DIFF  Your physician has requested that you have an echocardiogram. Echocardiography is a painless test that uses sound waves to create images of your heart. It provides your doctor with information about the size and shape of your heart and how well your heart's chambers and valves are working. This procedure takes approximately one hour. There are no restrictions for this procedure.  Your physician recommends that you schedule a follow-up appointment in: [place], [place]  Your physician has recommended that you wear a 48 HOUR holter monitor. Holter monitors are medical devices that record the heart's electrical activity. Doctors most often use these monitors to diagnose arrhythmias. Arrhythmias are problems with the speed or rhythm of the heartbeat. The monitor is a small, portable device. You can wear one while you do your normal daily activities. This is usually used to diagnose what is causing palpitations/syncope (passing out).

## 2014-08-24 NOTE — Progress Notes (Signed)
Cardiology Office Note   Date:  08/24/2014   ID:  [name], DOB [birth year], MRN [id number]  PCP:  Mackie Pai, MD  Cardiologist:  Dr. Loralie Champagne     Chief Complaint  Patient presents with  . Palpitations     History of Present Illness: [name] is a 49 y.o. female with a hx of chest pain and prior negative caths as well as migraines, asthma, seizure d/o s/p vagal nerve stimulator and HTN. She has had chest pain chronically over a number of years. She had an abnormal stress test in [place] in 2008. She was then sent for a cardiac cath, which she says was normal. We had her do an ETT-myoview in this office in 11/12. This showed ischemic-type ST depression on exercise ECG, but there was no evidence for ischemia on myoview images. EF was 66%. Given ongoing chest pain and ischemic ECG changes with normal perfusion images (equivocal study), she underwent a coronary CT angiogram. This was a difficult study because we were not able to get her HR as low as would be ideal. However, she had no coronary calcium and no definite stenosis was seen (though mid-RCA was not fully evaluated due to artifact). Dr. [name] felt that this was a negative study. She continued to have chest tightness with walking long distances, climbing a flight of steps, and housework. In 5/13, she went to the ER in [place] with particularly bad exertional chest pain that lasted a prolonged time. Troponin was mildly elevated with normal CKMB. She had a left heart cath in the hospital in [place] which showed no angiographic CAD. She was sent home on Imdur and amlodipine. Echo showed normal EF. 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 She stopped the amlodipine due to ankle swelling.  She is followed by [facility] for Asthma.   Last seen here 06/19/14.  Over the last couple of months, she has noticed palpitations. These recur throughout the day and seem to  be worse with any type of activity she does. She notes associated dyspnea. She's also noted some chest discomfort that she describes as an ache. This has been present for the past 2 weeks. She started having some swelling in her hands and feet and started taking Lasix about 2 weeks ago. This has not improved her shortness of breath or chest pain. She did have one episode that was severe with nausea and took nitroglycerin 1 with relief. She sleeps on 3 pillows and has done so for years. She occasionally awakens short of breath. LE edema is improved since starting Lasix. She denies syncope or near-syncope. She has been lightheaded at times with her palpitations. Her heart rate does not feel rapid. She describes her palpitations as a flip-flopping sensation.   Studies/Reports Reviewed Today:  Myoview 02/17/14 [facility]) Lexiscan - No ischemia, EF 70%  Echo 09/30/12 - Mild LVH. EF 55-65%. Wall motion was normal.  Grade 2 diastolic dysfunction. - Aortic valve: Trivial regurgitation.   Past Medical History: 1. Chest pain: Multiple episodes over the years. She had an abnormal stress test in 2008 in [place] and it sounds like she went to [facility] for a cardiac catheterization which per her report was normal. Stress echo at [facility] in 2010 was submaximal but showed no evidence for ischemia. ETT-myoview (11/12): 7'15", EF 66%, significant ST depression on exercise ECG, apical thinning on myoview images but no evidence for ischemia. Coronary CT  angiogram (11/12): calcium score 0, difficult study with gating artifact, mid RCA nonevaluatable but no stenosis elsewhere in the coronaries. Echo (11/12): EF 55-60%, mild LV hypertrophy, mild MR. NSTEMI in [place] in 5/13 with elevated troponin but LHC showed normal coronaries. Echo (5/13) with EF 60-65%, normal valve, no LVH. 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 Echo 09/30/12: Mild LVH, EF 55-65%, and GR 2 DD, trivial AI. 2. Migraines: Uses triptan  medications.  3. HTN  4. Seizure disorder: Has vagus nerve stimulator.  5. Hypothyroidism  6. Asthma since childhood Past Medical History  Diagnosis Date  . HTN (hypertension)   . Seizure disorder     a. since childhood. b. s/p vagal nerve stimulator at [facility].  . Migraine   . Asthma   . Hypothyroid   . Obesity   . MI (myocardial infarction)   . Complication of anesthesia     hard to wake up-had to be reminded to breath  . Hx of cardiovascular stress test     Lex Myoview 8/14:  Normal, EF 74%  . Chronic chest pain     a. Abnl stress [place] 2008, f/u cath reportedly nl. b. ETT-Myoview 04/2011 - EKG changes but normal perfusion. Cor CT - no coronary calcium, no definite stenosis though mRCA not fully evaluated. c. 10/2011 - tn elevated in [place], LHC without CAD. Started on Ranexa, anti-anginals ?microvascular dz but later stopped while in hospital on abx.  Marland Kitchen MRSA infection     a. After vagal nerve stimulator at [facility] - surgical site MRSA infection, PICC placed.  . Biliary dyskinesia     a. s/p cholecystectomy.  Marland Kitchen GERD (gastroesophageal reflux disease)     a. Severe.  . Seizures     Past Surgical History  Procedure Laterality Date  . Cardiac catheterization      [facility]   . Cesarean section      placement of vagal nerve stimulator.  . Implantation vagal nerve stimulator  [id number]    [facility]  . Cholecystectomy N/A 01/24/2013    Procedure: LAPAROSCOPIC CHOLECYSTECTOMY;  Surgeon: Harl Bowie, MD;  Location: [place];  Service: General;  Laterality: N/A;     Current Outpatient Prescriptions  Medication Sig Dispense Refill  . BENICAR 20 MG tablet TAKE 1 TABLET (20 MG TOTAL) BY MOUTH DAILY. 30 tablet 6  . Coenzyme Q10 200 MG TABS Take 1 tablet by mouth every evening.   0  . Cyanocobalamin (VITAMIN B-12 IJ) Inject 1,000 mcg as directed every 28 (twenty-eight) days.    Marland Kitchen dexlansoprazole (DEXILANT) 60 MG capsule Take 60  mg by mouth daily.    . fluticasone (FLONASE) 50 MCG/ACT nasal spray Place 1 spray into the nose 2 (two) times daily. 1 SPRAY IN BOTH NOSTRILS BID    . furosemide (LASIX) 20 MG tablet Take 1 tablet (20 mg total) by mouth daily as needed for edema. 30 tablet 2  . isosorbide mononitrate (IMDUR) 60 MG 24 hr tablet Take 60 mg by mouth 2 (two) times daily.    Marland Kitchen levothyroxine (SYNTHROID, LEVOTHROID) 50 MCG tablet Take 50 mcg by mouth daily.     . methocarbamol (ROBAXIN) 500 MG tablet Take 1 tablet (500 mg total) by mouth 2 (two) times daily. 40 tablet 0  . metoprolol succinate (TOPROL-XL) 25 MG 24 hr tablet Take 1 tablet (25 mg total) by mouth 2 (two) times daily. 60 tablet 5  . montelukast (SINGULAIR) 10 MG tablet Take 10 mg by  mouth at bedtime. 1 TAB QHS    . nitroGLYCERIN (NITRODUR - DOSED IN MG/24 HR) 0.2 mg/hr patch PLACE 1 PATCH ONTO THE SKIN DAILY. 30 patch 1  . nitroGLYCERIN (NITROSTAT) 0.4 MG SL tablet 0.4 mg tab sl 30 min prior to meals and at bedtime 25 tablet 3  . promethazine (PHENERGAN) 25 MG tablet Take 25 mg by mouth as needed. 1 TAB PO Q 4-6 HRS    . ranitidine (ZANTAC) 150 MG tablet Take 150 mg by mouth 2 (two) times daily.  11  . topiramate (TOPAMAX) 100 MG tablet Take 200 mg by mouth 2 (two) times daily.     No current facility-administered medications for this visit.    Allergies:   Amantadines; Amitriptyline; Amlodipine; Aspirin; Latex; and Simvastatin    Social History:  The patient  reports that she has never smoked. She has never used smokeless tobacco. She reports that she drinks alcohol. She reports that she does not use illicit drugs.   Family History:  The patient's family history includes Cancer in her other; Coronary artery disease in her maternal grandmother; Coronary artery disease (age of onset: 52) in her mother; Hypertension in her other; Stroke in her other.    ROS:   Please see the history of present illness.   Review of Systems  Constitution: Negative for  fever.  Respiratory: Negative for cough.   Gastrointestinal: Positive for dysphagia, heartburn and hematochezia. Negative for melena and vomiting.  All other systems reviewed and are negative.    PHYSICAL EXAM: VS:  BP 122/84 mmHg  Pulse 96  Ht 5\' 5"  (1.651 m)  Wt 239 lb 1.9 oz (108.464 kg)  BMI 39.79 kg/m2    Wt Readings from Last 3 Encounters:  08/24/14 239 lb 1.9 oz (108.464 kg)  06/19/14 244 lb (110.678 kg)  03/06/14 223 lb (101.152 kg)     GEN: Well nourished, well developed, in no acute distress HEENT: normal Neck: no JVD, no masses Cardiac:  Normal S1/S2, RRR; no murmur ,  no rubs or gallops, no edema  Respiratory:  clear to auscultation bilaterally, no wheezing, rhonchi or rales. GI: soft, nontender, nondistended, + BS MS: no deformity or atrophy Skin: warm and dry  Neuro:  CNs II-XII intact, Strength and sensation are intact Psych: Normal affect   EKG:  EKG is ordered today.  It demonstrates:   NSR, HR 96, normal axis, nonspecific ST-T wave changes, no significant change compared to prior tracings   Recent Labs: 12/19/2013: BUN 13; Creatinine 0.93; Hemoglobin 12.5; Platelets 194; Potassium 3.8; Sodium 141 06/19/2014: TSH 0.783    Lipid Panel    Component Value Date/Time   CHOL 166 09/30/2012 0929   TRIG 56.0 09/30/2012 0929   HDL 51.30 09/30/2012 0929   CHOLHDL 3 09/30/2012 0929   VLDL 11.2 09/30/2012 0929   LDLCALC 104* 09/30/2012 0929      ASSESSMENT AND PLAN:  Palpitations These symptoms are new for her. She is quite symptomatic with them. They occur on a daily basis. They seem to be worse with any type of activity she does.    -  Labs today: Basic metabolic panel, BNP, magnesium, CBC, TSH    -  Arrange 48-hour Holter monitor.      -  Arrange echocardiogram.    Chronic chest pain She has had some different types of chest pain recently. These seem to be associated with her palpitations.  She had a recent Myoview at [facility] was low risk. She  does not require any further ischemic testing at this time. Obtain echocardiogram as noted. Wells criteria negative. Oxygen saturation room air today is 98%.  Essential hypertension Controlled.   Gastroesophageal reflux disease, esophagitis presence not specified Question if her symptoms of chest pain may be related to this. Continue current therapy and follow up with GI as planned.  Asthma, moderate persistent, uncomplicated Chest pain or shortness of breath may also be explained by asthma. Follow-up with pulmonology as planned.   Shortness of breath  Check BNP today. She takes Lasix on a daily basis. If her BNP is significantly elevated, adjust diuretics.    Current medicines are reviewed at length with the patient today.  The patient does not have concerns regarding medicines.  The following changes have been made:  As above.   Labs/ tests ordered today include:  Orders Placed This Encounter  Procedures  . Basic Metabolic Panel (BMET)  . Magnesium  . CBC w/Diff  . TSH  . B Nat Peptide  . EKG 12-Lead  . Holter monitor - 48 hour  . 2D Echocardiogram without contrast    Disposition:   FU with Dr. Loralie Champagne or me 1 month.    Signed, Versie Starks, MHS 08/24/2014 3:19 PM    [facility] [place], [place], [place]  [address] Phone: [phone]; Fax: [phone]

## 2014-08-26 ENCOUNTER — Ambulatory Visit: Payer: Medicare Other | Admitting: Physician Assistant

## 2014-08-27 NOTE — Telephone Encounter (Signed)
lmptcb x 3 to go over lab results . I will send out letter to pt today tcb to discuss results.

## 2014-08-28 ENCOUNTER — Encounter (INDEPENDENT_AMBULATORY_CARE_PROVIDER_SITE_OTHER): Payer: Medicare Other

## 2014-08-28 ENCOUNTER — Telehealth: Payer: Self-pay | Admitting: *Deleted

## 2014-08-28 ENCOUNTER — Ambulatory Visit (HOSPITAL_COMMUNITY): Payer: Medicare Other | Attending: Cardiology

## 2014-08-28 ENCOUNTER — Encounter: Payer: Self-pay | Admitting: Radiology

## 2014-08-28 ENCOUNTER — Encounter: Payer: Self-pay | Admitting: Physician Assistant

## 2014-08-28 DIAGNOSIS — R002 Palpitations: Secondary | ICD-10-CM | POA: Diagnosis not present

## 2014-08-28 DIAGNOSIS — J45909 Unspecified asthma, uncomplicated: Secondary | ICD-10-CM | POA: Diagnosis not present

## 2014-08-28 DIAGNOSIS — I10 Essential (primary) hypertension: Secondary | ICD-10-CM | POA: Insufficient documentation

## 2014-08-28 DIAGNOSIS — E785 Hyperlipidemia, unspecified: Secondary | ICD-10-CM | POA: Diagnosis not present

## 2014-08-28 DIAGNOSIS — R0602 Shortness of breath: Secondary | ICD-10-CM

## 2014-08-28 DIAGNOSIS — R079 Chest pain, unspecified: Secondary | ICD-10-CM | POA: Insufficient documentation

## 2014-08-28 DIAGNOSIS — E039 Hypothyroidism, unspecified: Secondary | ICD-10-CM | POA: Insufficient documentation

## 2014-08-28 DIAGNOSIS — R0600 Dyspnea, unspecified: Secondary | ICD-10-CM | POA: Insufficient documentation

## 2014-08-28 DIAGNOSIS — K219 Gastro-esophageal reflux disease without esophagitis: Secondary | ICD-10-CM | POA: Diagnosis not present

## 2014-08-28 NOTE — Progress Notes (Signed)
Patient ID: [name], female   DOB: Dec 22, 1965, 49 y.o.   MRN: [id number] Previntice 48hr holter applied

## 2014-08-28 NOTE — Telephone Encounter (Signed)
pt notified about lab results with verbal understanding  

## 2014-08-28 NOTE — Progress Notes (Signed)
2D Echo completed. 08/28/2014 

## 2014-08-31 ENCOUNTER — Telehealth: Payer: Self-pay | Admitting: *Deleted

## 2014-08-31 NOTE — Telephone Encounter (Signed)
pt notified about echo results with verbal understanding 

## 2014-09-01 ENCOUNTER — Other Ambulatory Visit: Payer: Self-pay | Admitting: *Deleted

## 2014-09-01 MED ORDER — OLMESARTAN MEDOXOMIL 20 MG PO TABS: 20.0000 mg | ORAL_TABLET | Freq: Every day | ORAL | Status: DC

## 2014-09-01 NOTE — Telephone Encounter (Signed)
Approved      Disp Refills Start End    nitroGLYCERIN (NITROSTAT) 0.4 MG SL tablet 25 tablet 3 06/11/2014     Sig:  0.4 mg tab sl 30 min prior to meals and at bedtime    Class:  Normal    DAW:  No    Authorizing Provider:  Larey Dresser, MD    Ordering User:  Guinevere Ferrari

## 2014-09-04 ENCOUNTER — Telehealth: Payer: Self-pay | Admitting: *Deleted

## 2014-09-04 NOTE — Telephone Encounter (Signed)
Dr [name] reviewed monitor done 08/28/14:  Rare PACs/PVCs  Voice mail message when I attempted to contact pt. [name]

## 2014-09-08 ENCOUNTER — Other Ambulatory Visit: Payer: Self-pay | Admitting: Cardiology

## 2014-09-08 NOTE — Telephone Encounter (Signed)
LMTCB

## 2014-09-10 NOTE — Telephone Encounter (Signed)
Pt.notified

## 2014-09-28 ENCOUNTER — Encounter: Payer: Medicare Other | Admitting: Physician Assistant

## 2014-09-28 NOTE — Progress Notes (Signed)
Cardiology Office Note   Date:  09/28/2014   ID:  Abigail, Richardson April 23, 1966, MRN [id number]  PCP:  Mackie Pai, MD  Cardiologist:  Dr. Loralie Champagne     No chief complaint on file.    History of Present Illness: [name] is a 49 y.o. female with a hx of chest pain and prior negative caths as well as migraines, asthma, seizure d/o s/p vagal nerve stimulator and HTN. She has had chest pain chronically over a number of years. She had an abnormal stress test in [place] in 2008. She was then sent for a cardiac cath, which she says was normal. We had her do an ETT-myoview in this office in 11/12. This showed ischemic-type ST depression on exercise ECG, but there was no evidence for ischemia on myoview images. EF was 66%. Given ongoing chest pain and ischemic ECG changes with normal perfusion images (equivocal study), she underwent a coronary CT angiogram. This was a difficult study because we were not able to get her HR as low as would be ideal. However, she had no coronary calcium and no definite stenosis was seen (though mid-RCA was not fully evaluated due to artifact). Dr. [name] felt that this was a negative study. She continued to have chest tightness with walking long distances, climbing a flight of steps, and housework. In 5/13, she went to the ER in [place] with particularly bad exertional chest pain that lasted a prolonged time. Troponin was mildly elevated with normal CKMB. She had a left heart cath in the hospital in [place] which showed no angiographic CAD. She was sent home on Imdur and amlodipine. Echo showed normal EF. 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 She stopped the amlodipine due to ankle swelling.  She is followed by [facility] for Asthma.   Last seen here by me 08/24/14.  Holter was obtained and demonstrated PAC/PVCs, no significant arrhythmia.  Echo demonstrated normal LVF, mild diastolic  dysfunction, no significant valvular abnormalities.  She returns for FU.    Studies/Reports Reviewed Today:  61 Hr Holter 08/2014 No arrhythmia; rare PVC/PACs  Echo 08/28/14 - Mild LVH. EF 55% to 60%. Grade 1 diastolic dysfunction). - Aortic valve: There was no stenosis. - Mitral valve: There was trivial regurgitation. - Left atrium: The atrium was mildly dilated. - Right ventricle: The cavity size was normal. Systolic functionwas normal. - Pulmonary arteries: No complete TR doppler jet so unable toestimate PA systolic pressure. Impressions:  Normal LV size with mild LV hypertrophy. EF 55-60%. Mild LAE.  Normal RV size and systolic function. No significant valvular abnormalities.  Myoview 02/17/14 [facility]) Lexiscan - No ischemia, EF 70%  Echo 09/30/12 - Mild LVH. EF 55-65%. Wall motion was normal.  Grade 2 diastolic dysfunction. - Aortic valve: Trivial regurgitation.   Past Medical History: 1. Chest pain: Multiple episodes over the years. She had an abnormal stress test in 2008 in [place] and it sounds like she went to [facility] for a cardiac catheterization which per her report was normal. Stress echo at [facility] in 2010 was submaximal but showed no evidence for ischemia. ETT-myoview (11/12): 7'15", EF 66%, significant ST depression on exercise ECG, apical thinning on myoview images but no evidence for ischemia. Coronary CT angiogram (11/12): calcium score 0, difficult study with gating artifact, mid RCA nonevaluatable but no stenosis elsewhere in the coronaries. Echo (11/12): EF 55-60%, mild LV hypertrophy, mild MR. NSTEMI in [place]  in 5/13 with elevated troponin but LHC showed normal coronaries. Echo (5/13) with EF 60-65%, normal valve, no LVH. 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 Echo 09/30/12: Mild LVH, EF 55-65%, and GR 2 DD, trivial AI. 2. Migraines: Uses triptan medications.  3. HTN  4. Seizure disorder: Has vagus nerve stimulator.  5. Hypothyroidism  6.  Asthma since childhood Past Medical History  Diagnosis Date  . HTN (hypertension)   . Seizure disorder     a. since childhood. b. s/p vagal nerve stimulator at [facility].  . Migraine   . Asthma   . Hypothyroid   . Obesity   . MI (myocardial infarction)   . Complication of anesthesia     hard to wake up-had to be reminded to breath  . Hx of cardiovascular stress test     Lex Myoview 8/14:  Normal, EF 74%  . Chronic chest pain     a. Abnl stress [place] 2008, f/u cath reportedly nl. b. ETT-Myoview 04/2011 - EKG changes but normal perfusion. Cor CT - no coronary calcium, no definite stenosis though mRCA not fully evaluated. c. 10/2011 - tn elevated in [place], LHC without CAD. Started on Ranexa, anti-anginals ?microvascular dz but later stopped while in hospital on abx.  Marland Kitchen MRSA infection     a. After vagal nerve stimulator at [facility] - surgical site MRSA infection, PICC placed.  . Biliary dyskinesia     a. s/p cholecystectomy.  Marland Kitchen GERD (gastroesophageal reflux disease)     a. Severe.  . Seizures   . Hx of echocardiogram     Echo 3/16:  Mild LVH, EF 55-60%, Gr 1 DD, trivial MR, mild LAE, normal RVF    Past Surgical History  Procedure Laterality Date  . Cardiac catheterization      [facility]   . Cesarean section      placement of vagal nerve stimulator.  . Implantation vagal nerve stimulator  [id number]    [facility]  . Cholecystectomy N/A 01/24/2013    Procedure: LAPAROSCOPIC CHOLECYSTECTOMY;  Surgeon: Harl Bowie, MD;  Location: [place];  Service: General;  Laterality: N/A;     Current Outpatient Prescriptions  Medication Sig Dispense Refill  . Coenzyme Q10 200 MG TABS Take 1 tablet by mouth every evening.   0  . Cyanocobalamin (VITAMIN B-12 IJ) Inject 1,000 mcg as directed every 28 (twenty-eight) days.    Marland Kitchen dexlansoprazole (DEXILANT) 60 MG capsule Take 60 mg by mouth daily.    . fluticasone (FLONASE) 50 MCG/ACT nasal spray Place 1  spray into the nose 2 (two) times daily. 1 SPRAY IN BOTH NOSTRILS BID    . furosemide (LASIX) 20 MG tablet Take 1 tablet (20 mg total) by mouth daily as needed for edema. 30 tablet 2  . isosorbide mononitrate (IMDUR) 60 MG 24 hr tablet Take 60 mg by mouth 2 (two) times daily.    Marland Kitchen levothyroxine (SYNTHROID, LEVOTHROID) 50 MCG tablet Take 50 mcg by mouth daily.     . methocarbamol (ROBAXIN) 500 MG tablet Take 1 tablet (500 mg total) by mouth 2 (two) times daily. 40 tablet 0  . metoprolol succinate (TOPROL-XL) 25 MG 24 hr tablet Take 1 tablet (25 mg total) by mouth 2 (two) times daily. 60 tablet 5  . montelukast (SINGULAIR) 10 MG tablet Take 10 mg by mouth at bedtime. 1 TAB QHS    . nitroGLYCERIN (NITRODUR - DOSED IN MG/24 HR) 0.2 mg/hr patch PLACE 1 PATCH ONTO THE  SKIN DAILY. 30 patch 1  . nitroGLYCERIN (NITROSTAT) 0.4 MG SL tablet 0.4 mg tab sl 30 min prior to meals and at bedtime 25 tablet 3  . NITROSTAT 0.4 MG SL tablet PLACE 1 TABLET (0.4 MG TOTAL) UNDER THE TONGUE EVERY 5 (FIVE) MINUTES AS NEEDED FOR CHEST PAIN. 15 tablet 0  . olmesartan (BENICAR) 20 MG tablet Take 1 tablet (20 mg total) by mouth daily. 30 tablet 7  . promethazine (PHENERGAN) 25 MG tablet Take 25 mg by mouth as needed. 1 TAB PO Q 4-6 HRS    . ranitidine (ZANTAC) 150 MG tablet Take 150 mg by mouth 2 (two) times daily.  11  . topiramate (TOPAMAX) 100 MG tablet Take 200 mg by mouth 2 (two) times daily.     No current facility-administered medications for this visit.    Allergies:   Amantadines; Amitriptyline; Amlodipine; Aspirin; Latex; and Simvastatin    Social History:  The patient  reports that she has never smoked. She has never used smokeless tobacco. She reports that she drinks alcohol. She reports that she does not use illicit drugs.   Family History:  The patient's family history includes Cancer in her other; Coronary artery disease in her maternal grandmother; Coronary artery disease (age of onset: 31) in her mother;  Hypertension in her other; Stroke in her other.    ROS:   Please see the history of present illness.   ROS   PHYSICAL EXAM: VS:  There were no vitals taken for this visit.    Wt Readings from Last 3 Encounters:  08/24/14 239 lb 1.9 oz (108.464 kg)  06/19/14 244 lb (110.678 kg)  03/06/14 223 lb (101.152 kg)     GEN: Well nourished, well developed, in no acute distress HEENT: normal Neck: no JVD, no masses Cardiac:  Normal S1/S2, RRR; no murmur ,  no rubs or gallops, no edema  Respiratory:  clear to auscultation bilaterally, no wheezing, rhonchi or rales. GI: soft, nontender, nondistended, + BS MS: no deformity or atrophy Skin: warm and dry  Neuro:  CNs II-XII intact, Strength and sensation are intact Psych: Normal affect   EKG:  EKG is ordered today.  It demonstrates:   NSR, HR 96, normal axis, nonspecific ST-T wave changes, no significant change compared to prior tracings   Recent Labs: 08/24/2014: BUN 13; Creatinine 0.88; Hemoglobin 12.8; Magnesium 1.9; Platelets 219.0; Potassium 3.8; Pro B Natriuretic peptide (BNP) 4.0; Sodium 139; TSH 0.96    Lipid Panel    Component Value Date/Time   CHOL 166 09/30/2012 0929   TRIG 56.0 09/30/2012 0929   HDL 51.30 09/30/2012 0929   CHOLHDL 3 09/30/2012 0929   VLDL 11.2 09/30/2012 0929   LDLCALC 104* 09/30/2012 0929      ASSESSMENT AND PLAN:  Palpitations These symptoms are new for her. She is quite symptomatic with them. They occur on a daily basis. They seem to be worse with any type of activity she does.    -  Labs today: Basic metabolic panel, BNP, magnesium, CBC, TSH    -  Arrange 48-hour Holter monitor.      -  Arrange echocardiogram.    Chronic chest pain She has had some different types of chest pain recently. These seem to be associated with her palpitations.  She had a recent Myoview at [facility] was low risk. She does not require any further ischemic testing at this time. Obtain echocardiogram as noted. Wells  criteria negative. Oxygen saturation room air  today is 98%.  Essential hypertension Controlled.   Gastroesophageal reflux disease, esophagitis presence not specified Question if her symptoms of chest pain may be related to this. Continue current therapy and follow up with GI as planned.  Asthma, moderate persistent, uncomplicated Chest pain or shortness of breath may also be explained by asthma. Follow-up with pulmonology as planned.   Shortness of breath  Check BNP today. She takes Lasix on a daily basis. If her BNP is significantly elevated, adjust diuretics.    Current medicines are reviewed at length with the patient today.  The patient does not have concerns regarding medicines.  The following changes have been made:  As above.   Labs/ tests ordered today include:  No orders of the defined types were placed in this encounter.    Disposition:   FU with Dr. Loralie Champagne or me 1 month.    Signed, Versie Starks, MHS 09/28/2014 2:12 PM    [facility] [place], [place], [place]  [address] Phone: [phone]; Fax: [phone]    This encounter was created in error - please disregard.

## 2014-11-10 ENCOUNTER — Other Ambulatory Visit: Payer: Self-pay | Admitting: Cardiology

## 2014-11-11 NOTE — Telephone Encounter (Signed)
Per note 3.2.16

## 2014-12-01 ENCOUNTER — Emergency Department (INDEPENDENT_AMBULATORY_CARE_PROVIDER_SITE_OTHER)
Admission: EM | Admit: 2014-12-01 | Discharge: 2014-12-01 | Disposition: A | Discharge status: Home / Self Care | Payer: Medicare Other | Source: Home / Self Care | Attending: Family Medicine | Admitting: Family Medicine

## 2014-12-01 ENCOUNTER — Encounter (HOSPITAL_COMMUNITY): Payer: Self-pay | Admitting: *Deleted

## 2014-12-01 DIAGNOSIS — L237 Allergic contact dermatitis due to plants, except food: Secondary | ICD-10-CM

## 2014-12-01 MED ORDER — TRIAMCINOLONE ACETONIDE 40 MG/ML IJ SUSP
INTRAMUSCULAR | Status: AC
  Filled 2014-12-01: qty 1

## 2014-12-01 MED ORDER — TRIAMCINOLONE ACETONIDE 40 MG/ML IJ SUSP
40.0000 mg | Freq: Once | INTRAMUSCULAR | Status: AC
  Administered 2014-12-01: 40 mg via INTRAMUSCULAR

## 2014-12-01 MED ORDER — FLUTICASONE PROPIONATE 0.05 % EX CREA: TOPICAL_CREAM | Freq: Two times a day (BID) | CUTANEOUS | Status: DC

## 2014-12-01 NOTE — ED Provider Notes (Signed)
CSN: [id number]     Arrival date & time 12/01/14  1330 History   First MD Initiated Contact with Patient 12/01/14 1533     Chief Complaint  Patient presents with  . Rash   (Consider location/radiation/quality/duration/timing/severity/associated sxs/prior Treatment) Patient is a 49 y.o. female presenting with rash. The history is provided by the patient.  Rash Location:  Shoulder/arm and head/neck Head/neck rash location:  R neck Shoulder/arm rash location:  R forearm Quality: blistering, draining and itchiness   Severity:  Mild Onset quality:  Gradual Duration:  4 days Progression:  Spreading Chronicity:  New Context: plant contact   Relieved by:  None tried Worsened by:  Nothing tried Ineffective treatments:  None tried   Past Medical History  Diagnosis Date  . HTN (hypertension)   . Seizure disorder     a. since childhood. b. s/p vagal nerve stimulator at [facility].  . Migraine   . Asthma   . Hypothyroid   . Obesity   . MI (myocardial infarction)   . Complication of anesthesia     hard to wake up-had to be reminded to breath  . Hx of cardiovascular stress test     Lex Myoview 8/14:  Normal, EF 74%  . Chronic chest pain     a. Abnl stress [place] 2008, f/u cath reportedly nl. b. ETT-Myoview 04/2011 - EKG changes but normal perfusion. Cor CT - no coronary calcium, no definite stenosis though mRCA not fully evaluated. c. 10/2011 - tn elevated in [place], LHC without CAD. Started on Ranexa, anti-anginals ?microvascular dz but later stopped while in hospital on abx.  Marland Kitchen MRSA infection     a. After vagal nerve stimulator at [facility] - surgical site MRSA infection, PICC placed.  . Biliary dyskinesia     a. s/p cholecystectomy.  Marland Kitchen GERD (gastroesophageal reflux disease)     a. Severe.  . Seizures   . Hx of echocardiogram     Echo 3/16:  Mild LVH, EF 55-60%, Gr 1 DD, trivial MR, mild LAE, normal RVF   Past Surgical History  Procedure Laterality Date  . Cardiac catheterization       [facility]   . Cesarean section      placement of vagal nerve stimulator.  . Implantation vagal nerve stimulator  [id number]    [facility]  . Cholecystectomy N/A 01/24/2013    Procedure: LAPAROSCOPIC CHOLECYSTECTOMY;  Surgeon: Harl Bowie, MD;  Location: [place];  Service: General;  Laterality: N/A;   Family History  Problem Relation Age of Onset  . Coronary artery disease Mother 36  . Coronary artery disease Maternal Grandmother   . Cancer Other   . Hypertension Other   . Stroke Other    History  Substance Use Topics  . Smoking status: Never Smoker   . Smokeless tobacco: Never Used  . Alcohol Use: 0.0 oz/week    1-3 Glasses of wine per week     Comment: occasional   OB History    No data available     Review of Systems  Constitutional: Negative.   Skin: Positive for rash.    Allergies  Amantadines; Amitriptyline; Amlodipine; Aspirin; Latex; and Simvastatin  Home Medications   Prior to Admission medications   Medication Sig Start Date End Date Taking? Authorizing Provider  Coenzyme Q10 200 MG TABS Take 1 tablet by mouth every evening.  02/07/12   Larey Dresser, MD  Cyanocobalamin (VITAMIN B-12 IJ) Inject 1,000 mcg as directed  every 28 (twenty-eight) days.    Historical Provider, MD  dexlansoprazole (DEXILANT) 60 MG capsule Take 60 mg by mouth daily.    Historical Provider, MD  fluticasone (CUTIVATE) 0.05 % cream Apply topically 2 (two) times daily. 12/01/14   Billy Fischer, MD  fluticasone (FLONASE) 50 MCG/ACT nasal spray Place 1 spray into the nose 2 (two) times daily. 1 SPRAY IN BOTH NOSTRILS BID 04/17/14   Historical Provider, MD  furosemide (LASIX) 20 MG tablet Take 1 tablet (20 mg total) by mouth daily as needed for edema. 04/29/13   Larey Dresser, MD  isosorbide mononitrate (IMDUR) 60 MG 24 hr tablet TAKE 1 TABLET (60 MG TOTAL) BY MOUTH 2 (TWO) TIMES DAILY. 11/11/14   Larey Dresser, MD  levothyroxine (SYNTHROID,  LEVOTHROID) 50 MCG tablet Take 50 mcg by mouth daily.     Historical Provider, MD  methocarbamol (ROBAXIN) 500 MG tablet Take 1 tablet (500 mg total) by mouth 2 (two) times daily. 10/10/13   Ignacia Felling, PA-C  metoprolol succinate (TOPROL-XL) 25 MG 24 hr tablet Take 1 tablet (25 mg total) by mouth 2 (two) times daily. 06/19/14   Brett Canales, PA-C  montelukast (SINGULAIR) 10 MG tablet Take 10 mg by mouth at bedtime. 1 TAB QHS 04/17/14   Historical Provider, MD  nitroGLYCERIN (NITRODUR - DOSED IN MG/24 HR) 0.2 mg/hr patch PLACE 1 PATCH ONTO THE SKIN DAILY. 09/08/14   Larey Dresser, MD  nitroGLYCERIN (NITROSTAT) 0.4 MG SL tablet 0.4 mg tab sl 30 min prior to meals and at bedtime 06/11/14   Larey Dresser, MD  NITROSTAT 0.4 MG SL tablet PLACE 1 TABLET (0.4 MG TOTAL) UNDER THE TONGUE EVERY 5 (FIVE) MINUTES AS NEEDED FOR CHEST PAIN. 09/10/14   Larey Dresser, MD  olmesartan (BENICAR) 20 MG tablet Take 1 tablet (20 mg total) by mouth daily. 09/01/14   Larey Dresser, MD  promethazine (PHENERGAN) 25 MG tablet Take 25 mg by mouth as needed. 1 TAB PO Q 4-6 HRS    Historical Provider, MD  ranitidine (ZANTAC) 150 MG tablet Take 150 mg by mouth 2 (two) times daily. 05/02/14   Historical Provider, MD  topiramate (TOPAMAX) 100 MG tablet Take 200 mg by mouth 2 (two) times daily.    Historical Provider, MD   BP 130/68 mmHg  Pulse 78  Temp(Src) 98.6 F (37 C) (Oral)  Resp 18  SpO2 98%  LMP 11/24/2014 Physical Exam  Constitutional: She is oriented to person, place, and time. She appears well-developed and well-nourished.  Neurological: She is alert and oriented to person, place, and time.  Skin: Skin is warm and dry. Rash noted.  Patchy papulovesicular rash as noted  Nursing note and vitals reviewed.   ED Course  Procedures (including critical care time) Labs Review Labs Reviewed - No data to display  Imaging Review No results found.   MDM   1. Contact dermatitis due to poison ivy        Billy Fischer, MD 12/01/14 1558

## 2014-12-01 NOTE — ED Notes (Signed)
PT  REPORTS   SYMPTOMS   OF  RASH  THAT ITCHES   - PT  REPORTS  HAS BEEN  WORKING OUTSIDE  RECENTLY      -  SYMPTOMS     ARE   ON  HER  ARMS   AND  UPPER    CHEST        -  PT  THINKS  IT IS  POISON  IVY

## 2015-02-05 ENCOUNTER — Other Ambulatory Visit: Payer: Self-pay | Admitting: Cardiology

## 2015-02-25 ENCOUNTER — Encounter: Payer: Self-pay | Admitting: Physician Assistant

## 2015-02-25 ENCOUNTER — Ambulatory Visit (INDEPENDENT_AMBULATORY_CARE_PROVIDER_SITE_OTHER): Payer: Medicare Other | Admitting: Physician Assistant

## 2015-02-25 VITALS — BP 158/76 | HR 82 | Ht 65.5 in | Wt 253.8 lb

## 2015-02-25 DIAGNOSIS — K219 Gastro-esophageal reflux disease without esophagitis: Secondary | ICD-10-CM

## 2015-02-25 DIAGNOSIS — I10 Essential (primary) hypertension: Secondary | ICD-10-CM

## 2015-02-25 DIAGNOSIS — E039 Hypothyroidism, unspecified: Secondary | ICD-10-CM | POA: Diagnosis not present

## 2015-02-25 DIAGNOSIS — I5032 Chronic diastolic (congestive) heart failure: Secondary | ICD-10-CM

## 2015-02-25 DIAGNOSIS — R079 Chest pain, unspecified: Secondary | ICD-10-CM | POA: Diagnosis not present

## 2015-02-25 DIAGNOSIS — R0602 Shortness of breath: Secondary | ICD-10-CM | POA: Diagnosis not present

## 2015-02-25 DIAGNOSIS — Z01810 Encounter for preprocedural cardiovascular examination: Secondary | ICD-10-CM | POA: Diagnosis not present

## 2015-02-25 DIAGNOSIS — G8929 Other chronic pain: Secondary | ICD-10-CM

## 2015-02-25 LAB — BASIC METABOLIC PANEL
BUN: 14 mg/dL (ref 6–23)
CO2: 30 mEq/L (ref 19–32)
Calcium: 9.1 mg/dL (ref 8.4–10.5)
Chloride: 103 mEq/L (ref 96–112)
Creatinine, Ser: 0.7 mg/dL (ref 0.40–1.20)
GFR: 113.44 mL/min (ref >60.00)
Glucose, Bld: 107 mg/dL — ABNORMAL HIGH (ref 70–99)
Potassium: 3.7 mEq/L (ref 3.5–5.1)
Sodium: 139 mEq/L (ref 135–145)

## 2015-02-25 LAB — BRAIN NATRIURETIC PEPTIDE: Pro B Natriuretic peptide (BNP): 12 pg/mL (ref 0.0–100.0)

## 2015-02-25 MED ORDER — FUROSEMIDE 20 MG PO TABS: 20.0000 mg | ORAL_TABLET | Freq: Every day | ORAL | Status: DC

## 2015-02-25 MED ORDER — LOSARTAN POTASSIUM 25 MG PO TABS: 25.0000 mg | ORAL_TABLET | Freq: Every day | ORAL | Status: DC

## 2015-02-25 NOTE — Patient Instructions (Addendum)
Medication Instructions:  Your physician has recommended you make the following change in your medication:  1.  START Losartan 25 mg taking 1 tablet daily   Labwork: TODAY:  BMET                 BNP  Testing/Procedures: None ordered  Follow-Up: Your physician recommends that you schedule a follow-up appointment in: 3 MONTHS WITH DR. [name]   Any Other Special Instructions Will Be Listed Below (If Applicable). Please call our office if your blood pressure gets higher than 130/80  .

## 2015-02-25 NOTE — Progress Notes (Signed)
Cardiology Office Note Date:  02/25/2015  Patient ID:  Abigail, Richardson [birth year], MRN [id number] PCP:  Mackie Pai, MD  Cardiologist:  [name]   Chief Complaint: pre-op eval  History of Present Illness: [name] is a 49 y.o. 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   Her history of chronic chest pain is complex spanning the past several years. She had an abnormal stress test in [place] in 2008. She was then sent for a cardiac cath, which she says was normal. She underwent ETT-myoview in this office in 11/12 with ischemic-type ST depression on exercise ECG, but there was no evidence for ischemia on myoview images, EF 66%. Given ongoing chest pain and ischemic ECG changes with normal perfusion images (equivocal study), she was set up for coronary CT angiogram. This was a difficult study because we were not able to get her HR as low as would be ideal. However, she had no coronary calcium and no definite stenosis was seen (though mid-RCA was not fully evaluated due to artifact). Dr. [name] felt that this was a negative study. There were no significant extracardiac findings. She continued to have chest tightness with walking long distances, climbing a flight of steps, and housework. In 5/13, she went to the ER in [place] with particularly bad exertional chest pain that lasted a prolonged time. Troponin was mildly elevated with normal CKMB. She had a left heart cath in the hospital in [place] which showed no angiographic CAD, echo w/ normal EF. She was sent home on Imdur and amlodipine. Echo showed normal EF. 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 She stopped the amlodipine due to ankle swelling. Her Ranexa was later stopped while she was in the hospital for treatment of surgical site MRSA infection after she had her vagal nerve stimulator replaced at [facility]. She had required PICC line placed for antibiotics. She did not notice much change in her chest pain pattern off ranolazine. She had significant right arm pain since the PICC was removed and had an [place] that did not show a DVT. She also stopped her statin due to muscle pain. Her CPK level has been mildly elevated in the past. She has had to be on Bactrim in the past for for MRSA at the direction of her [facility] team. She was seen earlier this year for complaints of SOB and palpitations. BNP was normal (4), labs otherwise unremarkable. 2D Echo 08/28/14: normal LV size with mild LV hypertrophy, EF 55-60%, mild LAE, normal RV size and systolic function, no significant valvular abnormalities. 48 hour holter showed only 2 PVCs, otherwise normal.  She comes in today for pre-operative cardiovascular evaluation for upcoming colonoscopy and endoscopy on 03/04/15. She continues to have intermittent chest pain when doing activities around the house (this is her typical symptom). She uses NTG and NTG patch with variable relief. She says actually sometimes she takes a laxative - it helps some of the time but not always. She has a hard time determining if it is heart pain or related to her esophagus. She says she saw her pulmonologist for edema/SOB recently who heard fluid on her lungs and increased her Lasix to 20mg  daily. This has improved. No syncope.  She checks her BP regularly and it runs 097-353 systolic. She stopped Benicar because insurance wouldn't pay for it.   Past Medical History  Diagnosis Date  . HTN (hypertension)   . Seizure disorder     a. since childhood. b. s/p vagal nerve stimulator at [facility].  . Migraine   . Asthma   . Hypothyroid   . Obesity   . MI (myocardial infarction)    . Complication of anesthesia     hard to wake up-had to be reminded to breath  . Hx of cardiovascular stress test     Lex Myoview 8/14:  Normal, EF 74%  . Chronic chest pain     ?Microvascular angina vs spasm - a. Abnl stress [place] 2008, f/u cath reportedly nl. b. ETT-Myoview 04/2011 - EKG changes but normal perfusion. Cor CT - no coronary calcium, no definite stenosis though mRCA not fully evaluated. c. 10/2011 - tn elevated in [place], LHC without CAD. Started on Ranexa, anti-anginals ?microvascular dz but later stopped while in hospital on abx.  Marland Kitchen MRSA infection     a. After vagal nerve stimulator at [facility] - surgical site MRSA infection, PICC placed.  . Biliary dyskinesia     a. s/p cholecystectomy.  Marland Kitchen GERD (gastroesophageal reflux disease)     a. Severe.  . Seizures   . Hx of echocardiogram     Echo 3/16:  Mild LVH, EF 55-60%, Gr 1 DD, trivial MR, mild LAE, normal RVF  . Palpitations     a. 08/2014: 48 hour holter with 2 PVCs otherwise normal.    Past Surgical History  Procedure Laterality Date  . Cardiac catheterization      [facility]   . Cesarean section      placement of vagal nerve stimulator.  . Implantation vagal nerve stimulator  [id number]    battery chg-[facility]  . Cholecystectomy N/A 01/24/2013    Procedure: LAPAROSCOPIC CHOLECYSTECTOMY;  Surgeon: Harl Bowie, MD;  Location: [place];  Service: General;  Laterality: N/A;    Current Outpatient Prescriptions  Medication Sig Dispense Refill  . albuterol (PROAIR HFA) 108 (90 BASE) MCG/ACT inhaler Inhale 2 puffs into the lungs every 4 (four) hours as needed.    . Coenzyme Q10 200 MG TABS Take 1 tablet by mouth every evening.   0  . Cyanocobalamin (VITAMIN B-12 IJ) Inject 1,000 mcg as directed every 28 (twenty-eight) days.    . fluticasone (CUTIVATE) 0.05 % cream Apply topically 2 (two) times daily. 30 g 1  . fluticasone (FLONASE) 50 MCG/ACT nasal spray Place 1 spray into both  nostrils 2 (two) times daily.    . furosemide (LASIX) 20 MG tablet Take 1 tablet (20 mg total) by mouth daily as needed for edema. 30 tablet 2  . isosorbide mononitrate (IMDUR) 60 MG 24 hr tablet TAKE 1 TABLET (60 MG TOTAL) BY MOUTH 2 (TWO) TIMES DAILY. 60 tablet 6  . levothyroxine (SYNTHROID, LEVOTHROID) 50 MCG tablet Take 50 mcg by mouth daily.     . methocarbamol (ROBAXIN) 500 MG tablet Take 1 tablet (500 mg total) by mouth 2 (two) times daily. 40 tablet 0  . metoprolol succinate (TOPROL-XL) 25 MG 24 hr tablet Take 1 tablet (25 mg total) by mouth 2 (two) times daily. 60 tablet 5  . montelukast (SINGULAIR) 10 MG tablet Take 10 mg by mouth at bedtime. 1 TAB QHS    . naproxen (NAPROSYN) 500 MG tablet Take 500 mg by mouth 2 (  two) times daily.  1  . nitroGLYCERIN (NITRODUR - DOSED IN MG/24 HR) 0.2 mg/hr patch PLACE 1 PATCH ONTO THE SKIN DAILY. 30 patch 1  . NITROSTAT 0.4 MG SL tablet PLACE 1 TABLET (0.4 MG TOTAL) UNDER THE TONGUE EVERY 5 (FIVE) MINUTES AS NEEDED FOR CHEST PAIN. 25 tablet 3  . omeprazole (PRILOSEC) 40 MG capsule Take 1 capsule by mouth daily.    . promethazine (PHENERGAN) 25 MG tablet Take 25 mg by mouth as needed (for nausea). 1 TAB PO Q 4-6 HRS    . ranitidine (ZANTAC) 150 MG tablet Take 150 mg by mouth 2 (two) times daily.  11  . telmisartan (MICARDIS) 40 MG tablet Take 1 tablet by mouth daily.    Marland Kitchen topiramate (TOPAMAX) 100 MG tablet Take 200 mg by mouth 2 (two) times daily.    . verapamil (CALAN) 120 MG tablet Take 120 mg by mouth 3 (three) times daily.     No current facility-administered medications for this visit.    Allergies:   Amantadines; Amitriptyline; Amlodipine; Aspirin; Latex; and Simvastatin   Social History:  The patient  reports that she has never smoked. She has never used smokeless tobacco. She reports that she drinks alcohol. She reports that she does not use illicit drugs.   Family History:  The patient's family history includes Cancer in her other;  Coronary artery disease in her maternal grandmother; Coronary artery disease (age of onset: 36) in her mother; Hypertension in her other; Stroke in her other.  ROS:  Please see the history of present illness. All other systems are reviewed and otherwise negative.   PHYSICAL EXAM:  VS:  BP 158/76 mmHg  Pulse 82  Ht 5' 5.5" (1.664 m)  Wt 253 lb 12.8 oz (115.123 kg)  BMI 41.58 kg/m2 BMI: Body mass index is 41.58 kg/(m^2). Well nourished, well developed AAF, in no acute distress HEENT: normocephalic, atraumatic Neck: no JVD, carotid bruits or masses Cardiac:  normal S1, S2; RRR; no murmurs, rubs, or gallops Lungs:  clear to auscultation bilaterally, no wheezing, rhonchi or rales Abd: soft, nontender, no hepatomegaly, + BS MS: no deformity or atrophy Ext: no edema Skin: warm and dry, no rash Neuro:  moves all extremities spontaneously, no focal abnormalities noted, follows commands Psych: euthymic mood, full affect   EKG:  Done today shows NSR 82bpm, LVH with repol abnormality, cannot rule out septal infarct age undetermined, no sig change from prior  Recent Labs: 08/24/2014: BUN 13; Creatinine, Ser 0.88; Hemoglobin 12.8; Magnesium 1.9; Platelets 219.0; Potassium 3.8; Pro B Natriuretic peptide (BNP) 4.0; Sodium 139; TSH 0.96  No results found for requested labs within last 365 days.   CrCl cannot be calculated (Patient has no serum creatinine result on file.).   Wt Readings from Last 3 Encounters:  02/25/15 253 lb 12.8 oz (115.123 kg)  08/24/14 239 lb 1.9 oz (108.464 kg)  06/19/14 244 lb (110.678 kg)     Other studies reviewed: Additional studies/records reviewed today include: summarized above  ASSESSMENT AND PLAN:  1. Pre-operative cardiovascular examination - discussed case in depth with Dr. Burt Knack. Prior caths without any significant coronary disease. 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 EKG is stable. Per discussion with Dr.  Burt Knack, she is cleared to proceed with endoscopy and colonoscopy for further evaluation. Will send copy of this note to their office. 2. Chronic chest pain - see above. Intolerant of aspirin. 3. H/o GERD - pending endoscopy.  4.  Hypothyroidism - monitoring per primary care. 5. H/o edema/SOB - her chart carries prior diagnosis of chronic diastolic CHF. The validity of this is unclear as all prior BNPs including in 08/2014 have been normal. Recent 2D echo in the last 6 months showed mild LVH, otherwise normal EF. This may be due to elevated BP. Low sodium diet and avoidance of excessive fluid intake encouraged along with elevation of extremities. Will check BMET and BNP. If Cr OK, will plan to start Losartan 25mg  daily with BMET in 1 week. Advised patient to f/u BP at home as she has been doing and call if running >130/80. 6. Essential HTN - see above re: losartan.  Disposition: F/u with Dr. [name] in 6 months.  Current medicines are reviewed at length with the patient today.  The patient did not have any concerns regarding medicines.  Abigail Ache PA-C 02/25/2015 2:38 PM     [place] [place] [id number] [phone] (office)  [phone] (fax)

## 2015-02-25 NOTE — Progress Notes (Signed)
Quick Note:  Please call patient. BNP was completely normal so no evidence of fluid overload on board from her heart. Intermittent leg swelling is not likely to be from congestive heart failure. BMET is stable except blood sugar mildly elevated. Anessa Charley PA-C  ______

## 2015-02-26 ENCOUNTER — Telehealth: Payer: Self-pay | Admitting: Cardiology

## 2015-02-26 NOTE — Telephone Encounter (Signed)
Jeanann Lewandowsky says she faxed my note to his office yesterday. Thanks! Maeci Kalbfleisch PA-C

## 2015-02-26 NOTE — Telephone Encounter (Signed)
PT SAID SHE WAS TOLD TO CALL WITH HER GASTROENTEROLOGIST'S NAME AND NUMBER--DR [place] [phone]

## 2015-02-26 NOTE — Telephone Encounter (Signed)
Dayna- patient saw you yesterday- did a copy of your note just need to get routed to the GI?

## 2015-05-20 ENCOUNTER — Ambulatory Visit (INDEPENDENT_AMBULATORY_CARE_PROVIDER_SITE_OTHER): Payer: Medicare Other | Admitting: Cardiology

## 2015-05-20 ENCOUNTER — Encounter: Payer: Self-pay | Admitting: Cardiology

## 2015-05-20 VITALS — BP 140/70 | HR 93 | Ht 65.5 in | Wt 258.0 lb

## 2015-05-20 DIAGNOSIS — I10 Essential (primary) hypertension: Secondary | ICD-10-CM

## 2015-05-20 DIAGNOSIS — I5032 Chronic diastolic (congestive) heart failure: Secondary | ICD-10-CM | POA: Diagnosis not present

## 2015-05-20 DIAGNOSIS — K219 Gastro-esophageal reflux disease without esophagitis: Secondary | ICD-10-CM

## 2015-05-20 MED ORDER — METOPROLOL SUCCINATE ER 50 MG PO TB24: 50.0000 mg | ORAL_TABLET | Freq: Two times a day (BID) | ORAL | Status: DC

## 2015-05-20 NOTE — Patient Instructions (Signed)
Medication Instructions:  Increase metoprolol succinate (Toprol XL) to 50mg  two times a day.   Labwork: None today  Testing/Procedures: None   Follow-Up: Your physician wants you to follow-up in: 1 year with Dr [name]. (December 2017). You will receive a reminder letter in the mail two months in advance. If you don't receive a letter, please call our office to schedule the follow-up appointment.         If you need a refill on your cardiac medications before your next appointment, please call your pharmacy.

## 2015-05-22 NOTE — Progress Notes (Signed)
Patient ID: [name], female   DOB: 1966/01/05, 49 y.o.   MRN: [id number] PCP: Dr. Criss Rosales [facility]), Dr. Emilio Math [facility])  49 yo with history of chest pain and prior negative caths as well as migraines, asthma, and HTN presents for cardiology followup.  She has had chest pain chronically over a number of years.  She had an abnormal stress test in [place] in 2008.  She was then sent for a cardiac cath, which she says was normal.  We had her do an ETT-myoview in this office in 11/12.  This showed ischemic-type ST depression on exercise ECG, but there was no evidence for ischemia on myoview images.  EF was 66%.  Given ongoing chest pain and ischemic ECG changes with normal perfusion images (equivocal study), I had her get a coronary CT angiogram.  This was a difficult study because we were not able to get her HR as low as would be ideal.  However, she had no coronary calcium and no definite stenosis was seen (though mid-RCA was not fully evaluated due to artifact).  I think that this was a negative study.  She continued to have chest tightness with walking long distances, climbing a flight of steps, and housework.  In 5/13, she went to the ER in [place] with particularly bad exertional chest pain that lasted a prolonged time.  Troponin was mildly elevated with normal CKMB.  She had a left heart cath in the hospital and [place] which showed no angiographic CAD.  She was sent home on Imdur and amlodipine.  Echo showed normal EF.  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  She stopped the amlodipine due to ankle swelling.    In 12/13, she had her vagal nerve stimulator replaced at [facility].  This was complicated by a surgical site MRSA infection, and she had a PICC line placed for antibiotics.  Ranolazine was stopped at [facility] while she was getting antibiotics.  She did not notice much change in her chest pain pattern off ranolazine.    Recently, her  chest pain, which can be both exertional and nonexertional, has been stable compared to the past (no worse off ranolazine).  Last Cardiolite was in 8/14 and showed no ischemia or infarction.She is no longer taking aspirin due to gastric polyps.  She is short of breath with moderate exertion.  She is not taking Lasix daily.   Labs (9/12): creatinine 0.9 Labs (11/12): LDL 130, HDL 49, TSH normal, K 3.6, creatinine 0.8, Lp(a) 31 Labs (5/13): K 4, creatinine 0.7, LDL 91, TnI 0.86 => 0.98 with normal CKMB Labs (7/13): negative troponin Labs (12/13): TSH normal Labs (2/14): CPK 215, K 3.8, creatinine 0.9 Labs (3/14): BNP 29 Labs (9/16): K 3.7, creatinine 0.7, BNP 12  PMH:  1. Chest pain: Multiple episodes over the years.  She had an abnormal stress test in 2008 in [place] and it sounds like she went to [facility] for a cardiac catheterization which per her report was normal.   Stress echo at [facility] in 2010 was submaximal but showed no evidence for ischemia.  ETT-myoview (11/12): 7'15", EF 66%, significant ST depression on exercise ECG, apical thinning on myoview images but no evidence for ischemia. Coronary CT angiogram (11/12): calcium score 0, difficult study with gating artifact, mid RCA nonevaluatable but no stenosis elsewhere in the coronaries.   Echo (11/12): EF 55-60%, mild LV hypertrophy, mild MR.  NSTEMI in [place] in 5/13 with  elevated troponin but LHC showed normal coronaries.  Echo (5/13) with EF 60-65%, normal valve, no LVH.  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  Cardiolite (8/14) with EF 74%, no ischemia or infarction.  Echo (3/16) with EF 55-60%, mild LVH, normal RV size and systolic function.  2. Migraines: Uses triptan medications.  3. HTN: edema with amlodipine.  4. Seizure disorder: Has vagus nerve stimulator.  5. Hypothyroidism 6. Asthma since childhood 7. PVCs: Documented by holter in 3/16.   SH: Moved to [place] from [place].  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  Nonsmoker.   FH: Mother with MI at 31.  Grandmother with MI at 52.   ROS: All systems reviewed and negative except as per HPI.   Current Outpatient Prescriptions  Medication Sig Dispense Refill  . albuterol (PROAIR HFA) 108 (90 BASE) MCG/ACT inhaler Inhale 2 puffs into the lungs every 4 (four) hours as needed.    . Coenzyme Q10 200 MG TABS Take 1 tablet by mouth every evening.   0  . Cyanocobalamin (VITAMIN B-12 IJ) Inject 1,000 mcg as directed every 28 (twenty-eight) days.    . fluticasone (CUTIVATE) 0.05 % cream Apply topically 2 (two) times daily. 30 g 1  . fluticasone (FLONASE) 50 MCG/ACT nasal spray Place 1 spray into both nostrils 2 (two) times daily.    . furosemide (LASIX) 20 MG tablet Take 1 tablet (20 mg total) by mouth daily. 30 tablet 2  . isosorbide mononitrate (IMDUR) 60 MG 24 hr tablet TAKE 1 TABLET (60 MG TOTAL) BY MOUTH 2 (TWO) TIMES DAILY. 60 tablet 6  . levothyroxine (SYNTHROID, LEVOTHROID) 50 MCG tablet Take 50 mcg by mouth daily.     Marland Kitchen losartan (COZAAR) 25 MG tablet Take 1 tablet (25 mg total) by mouth daily. 90 tablet 3  . methocarbamol (ROBAXIN) 500 MG tablet Take 1 tablet (500 mg total) by mouth 2 (two) times daily. 40 tablet 0  . montelukast (SINGULAIR) 10 MG tablet Take 10 mg by mouth at bedtime. 1 TAB QHS    . naproxen (NAPROSYN) 500 MG tablet Take 500 mg by mouth 2 (two) times daily.  1  . nitroGLYCERIN (NITRODUR - DOSED IN MG/24 HR) 0.2 mg/hr patch PLACE 1 PATCH ONTO THE SKIN DAILY. 30 patch 1  . NITROSTAT 0.4 MG SL tablet PLACE 1 TABLET (0.4 MG TOTAL) UNDER THE TONGUE EVERY 5 (FIVE) MINUTES AS NEEDED FOR CHEST PAIN. 25 tablet 3  . omeprazole (PRILOSEC) 40 MG capsule Take 1 capsule by mouth daily.    . promethazine (PHENERGAN) 25 MG tablet Take 25 mg by mouth as needed (for nausea). 1 TAB PO Q 4-6 HRS    . ranitidine (ZANTAC) 150 MG tablet Take 150 mg by mouth 2 (two) times daily.  11  . telmisartan (MICARDIS) 40 MG tablet  Take 1 tablet by mouth daily.    Marland Kitchen topiramate (TOPAMAX) 100 MG tablet Take 200 mg by mouth 2 (two) times daily.    . verapamil (CALAN) 120 MG tablet Take 120 mg by mouth 3 (three) times daily.    . metoprolol succinate (TOPROL-XL) 50 MG 24 hr tablet Take 1 tablet (50 mg total) by mouth 2 (two) times daily. Take with or immediately following a meal. 180 tablet 3   No current facility-administered medications for this visit.    BP 140/70 mmHg  Pulse 93  Ht 5' 5.5" (1.664 m)  Wt 258 lb (117.028 kg)  BMI 42.27 kg/m2  SpO2  98% General: NAD, overweight Neck: No JVD, no thyromegaly or thyroid nodule.  Lungs: Clear to auscultation bilaterally with normal respiratory effort. CV: Nondisplaced PMI.  Heart regular S1/S2, no S3/S4, 1/6 SEM.  No ankle edema.  No carotid bruit.  Normal pedal pulses.  Abdomen: Soft, nontender, no hepatosplenomegaly, no distention.  Neurologic: Alert and oriented x 3.  Psych: Normal affect. Extremities: No clubbing or cyanosis.   Assessment/Plan: 1. Chest pain: Extensive past workup.  Stable recently.  It is possible that she has microvascular angina (versus vasospasm).  She is not taking amlodipine due to lower extremity swelling and is not taking ranolazine since it seemed ineffective.  She will continue metoprolol, verapamil, and Imdur.  She is off aspirin with gastric polyps. 2. Exertional dyspnea: Stable, not taking Lasix regularly and does not look volume overloaded on exam. 3. Hyperlipidemia: She is off statin due to myalgias and mildly elevated CPK.   Loralie Champagne 05/22/2015

## 2015-06-07 ENCOUNTER — Emergency Department (INDEPENDENT_AMBULATORY_CARE_PROVIDER_SITE_OTHER)
Admission: EM | Admit: 2015-06-07 | Discharge: 2015-06-07 | Disposition: A | Discharge status: Home / Self Care | Payer: Medicare Other | Source: Home / Self Care

## 2015-06-07 ENCOUNTER — Encounter (HOSPITAL_COMMUNITY): Payer: Self-pay

## 2015-06-07 ENCOUNTER — Emergency Department (INDEPENDENT_AMBULATORY_CARE_PROVIDER_SITE_OTHER): Payer: Medicare Other

## 2015-06-07 DIAGNOSIS — S93401A Sprain of unspecified ligament of right ankle, initial encounter: Secondary | ICD-10-CM

## 2015-06-07 MED ORDER — TRAMADOL HCL 50 MG PO TABS: 100.0000 mg | ORAL_TABLET | Freq: Four times a day (QID) | ORAL | Status: DC | PRN

## 2015-06-07 NOTE — Discharge Instructions (Signed)
Acute Ankle Sprain With Phase I Rehab Feel that you been diagnosed with an acute ankle sprain which is negative by x-ray for fracture of the bone Typically it so ligaments that are affected and sprain You will need follow-up with orthopedics if you do not have any relief from pain and putting the ASO brace on for a period of time I will prescribe tramadol 100 mg every 6 hours which she can take without regards to food and this should help if you better If you do not feel better over the course of time delineated above then you should follow up with orthopedics Try the following R rest, I ice, C compression, E elevate   An acute ankle sprain is a partial or complete tear in one or more of the ligaments of the ankle due to traumatic injury. The severity of the injury depends on both the number of ligaments sprained and the grade of sprain. There are 3 grades of sprains.   A grade 1 sprain is a mild sprain. There is a slight pull without obvious tearing. There is no loss of strength, and the muscle and ligament are the correct length.  A grade 2 sprain is a moderate sprain. There is tearing of fibers within the substance of the ligament where it connects two bones or two cartilages. The length of the ligament is increased, and there is usually decreased strength.  A grade 3 sprain is a complete rupture of the ligament and is uncommon. In addition to the grade of sprain, there are three types of ankle sprains.  Lateral ankle sprains: This is a sprain of one or more of the three ligaments on the outer side (lateral) of the ankle. These are the most common sprains. Medial ankle sprains: There is one large triangular ligament of the inner side (medial) of the ankle that is susceptible to injury. Medial ankle sprains are less common. Syndesmosis, "high ankle," sprains: The syndesmosis is the ligament that connects the two bones of the lower leg. Syndesmosis sprains usually only occur with very severe ankle  sprains. SYMPTOMS  Pain, tenderness, and swelling in the ankle, starting at the side of injury that may progress to the whole ankle and foot with time.  "Pop" or tearing sensation at the time of injury.  Bruising that may spread to the heel.  Impaired ability to walk soon after injury. CAUSES   Acute ankle sprains are caused by trauma placed on the ankle that temporarily forces or pries the anklebone (talus) out of its normal socket.  Stretching or tearing of the ligaments that normally hold the joint in place (usually due to a twisting injury). RISK INCREASES WITH:  Previous ankle sprain.  Sports in which the foot may land awkwardly (i.e., basketball, volleyball, or soccer) or walking or running on uneven or rough surfaces.  Shoes with inadequate support to prevent sideways motion when stress occurs.  Poor strength and flexibility.  Poor balance skills.  Contact sports. PREVENTION   Warm up and stretch properly before activity.  Maintain physical fitness:  Ankle and leg flexibility, muscle strength, and endurance.  Cardiovascular fitness.  Balance training activities.  Use proper technique and have a coach correct improper technique.  Taping, protective strapping, bracing, or high-top tennis shoes may help prevent injury. Initially, tape is best; however, it loses most of its support function within 10 to 15 minutes.  Wear proper-fitted protective shoes (High-top shoes with taping or bracing is more effective than either alone).  Provide  the ankle with support during sports and practice activities for 12 months following injury. PROGNOSIS   If treated properly, ankle sprains can be expected to recover completely; however, the length of recovery depends on the degree of injury.  A grade 1 sprain usually heals enough in 5 to 7 days to allow modified activity and requires an average of 6 weeks to heal completely.  A grade 2 sprain requires 6 to 10 weeks to heal  completely.  A grade 3 sprain requires 12 to 16 weeks to heal.  A syndesmosis sprain often takes more than 3 months to heal. RELATED COMPLICATIONS   Frequent recurrence of symptoms may result in a chronic problem. Appropriately addressing the problem the first time decreases the frequency of recurrence and optimizes healing time. Severity of the initial sprain does not predict the likelihood of later instability.  Injury to other structures (bone, cartilage, or tendon).  A chronically unstable or arthritic ankle joint is a possibility with repeated sprains. TREATMENT Treatment initially involves the use of ice, medication, and compression bandages to help reduce pain and inflammation. Ankle sprains are usually immobilized in a walking cast or boot to allow for healing. Crutches may be recommended to reduce pressure on the injury. After immobilization, strengthening and stretching exercises may be necessary to regain strength and a full range of motion. Surgery is rarely needed to treat ankle sprains. MEDICATION   Nonsteroidal anti-inflammatory medications, such as aspirin and ibuprofen (do not take for the first 3 days after injury or within 7 days before surgery), or other minor pain relievers, such as acetaminophen, are often recommended. Take these as directed by your caregiver. Contact your caregiver immediately if any bleeding, stomach upset, or signs of an allergic reaction occur from these medications.  Ointments applied to the skin may be helpful.  Pain relievers may be prescribed as necessary by your caregiver. Do not take prescription pain medication for longer than 4 to 7 days. Use only as directed and only as much as you need. HEAT AND COLD  Cold treatment (icing) is used to relieve pain and reduce inflammation for acute and chronic cases. Cold should be applied for 10 to 15 minutes every 2 to 3 hours for inflammation and pain and immediately after any activity that aggravates your  symptoms. Use ice packs or an ice massage.  Heat treatment may be used before performing stretching and strengthening activities prescribed by your caregiver. Use a heat pack or a warm soak. SEEK IMMEDIATE MEDICAL CARE IF:   Pain, swelling, or bruising worsens despite treatment.  You experience pain, numbness, discoloration, or coldness in the foot or toes.  New, unexplained symptoms develop (drugs used in treatment may produce side effects.) EXERCISES  PHASE I EXERCISES RANGE OF MOTION (ROM) AND STRETCHING EXERCISES - Ankle Sprain, Acute Phase I, Weeks 1 to 2 These exercises may help you when beginning to restore flexibility in your ankle. You will likely work on these exercises for the 1 to 2 weeks after your injury. Once your physician, physical therapist, or athletic trainer sees adequate progress, he or she will advance your exercises. While completing these exercises, remember:   Restoring tissue flexibility helps normal motion to return to the joints. This allows healthier, less painful movement and activity.  An effective stretch should be held for at least 30 seconds.  A stretch should never be painful. You should only feel a gentle lengthening or release in the stretched tissue. RANGE OF MOTION - Dorsi/Plantar Flexion  While sitting with your right / left knee straight, draw the top of your foot upwards by flexing your ankle. Then reverse the motion, pointing your toes downward.  Hold each position for __________ seconds.  After completing your first set of exercises, repeat this exercise with your knee bent. Repeat __________ times. Complete this exercise __________ times per day.  RANGE OF MOTION - Ankle Alphabet  Imagine your right / left big toe is a pen.  Keeping your hip and knee still, write out the entire alphabet with your "pen." Make the letters as large as you can without increasing any discomfort. Repeat __________ times. Complete this exercise __________ times  per day.  STRENGTHENING EXERCISES - Ankle Sprain, Acute -Phase I, Weeks 1 to 2 These exercises may help you when beginning to restore strength in your ankle. You will likely work on these exercises for 1 to 2 weeks after your injury. Once your physician, physical therapist, or athletic trainer sees adequate progress, he or she will advance your exercises. While completing these exercises, remember:   Muscles can gain both the endurance and the strength needed for everyday activities through controlled exercises.  Complete these exercises as instructed by your physician, physical therapist, or athletic trainer. Progress the resistance and repetitions only as guided.  You may experience muscle soreness or fatigue, but the pain or discomfort you are trying to eliminate should never worsen during these exercises. If this pain does worsen, stop and make certain you are following the directions exactly. If the pain is still present after adjustments, discontinue the exercise until you can discuss the trouble with your clinician. STRENGTH - Dorsiflexors  Secure a rubber exercise band/tubing to a fixed object (i.e., table, pole) and loop the other end around your right / left foot.  Sit on the floor facing the fixed object. The band/tubing should be slightly tense when your foot is relaxed.  Slowly draw your foot back toward you using your ankle and toes.  Hold this position for __________ seconds. Slowly release the tension in the band and return your foot to the starting position. Repeat __________ times. Complete this exercise __________ times per day.  STRENGTH - Plantar-flexors   Sit with your right / left leg extended. Holding onto both ends of a rubber exercise band/tubing, loop it around the ball of your foot. Keep a slight tension in the band.  Slowly push your toes away from you, pointing them downward.  Hold this position for __________ seconds. Return slowly, controlling the tension in  the band/tubing. Repeat __________ times. Complete this exercise __________ times per day.  STRENGTH - Ankle Eversion  Secure one end of a rubber exercise band/tubing to a fixed object (table, pole). Loop the other end around your foot just before your toes.  Place your fists between your knees. This will focus your strengthening at your ankle.  Drawing the band/tubing across your opposite foot, slowly, pull your little toe out and up. Make sure the band/tubing is positioned to resist the entire motion.  Hold this position for __________ seconds. Have your muscles resist the band/tubing as it slowly pulls your foot back to the starting position.  Repeat __________ times. Complete this exercise __________ times per day.  STRENGTH - Ankle Inversion  Secure one end of a rubber exercise band/tubing to a fixed object (table, pole). Loop the other end around your foot just before your toes.  Place your fists between your knees. This will focus your strengthening at your ankle.  Slowly, pull your big toe up and in, making sure the band/tubing is positioned to resist the entire motion.  Hold this position for __________ seconds.  Have your muscles resist the band/tubing as it slowly pulls your foot back to the starting position. Repeat __________ times. Complete this exercises __________ times per day.  STRENGTH - Towel Curls  Sit in a chair positioned on a non-carpeted surface.  Place your right / left foot on a towel, keeping your heel on the floor.  Pull the towel toward your heel by only curling your toes. Keep your heel on the floor.  If instructed by your physician, physical therapist, or athletic trainer, add weight to the end of the towel. Repeat __________ times. Complete this exercise __________ times per day.   This information is not intended to replace advice given to you by your health care provider. Make sure you discuss any questions you have with your health care  provider.   Document Released: 12/21/2004 Document Revised: 06/12/2014 Document Reviewed: 09/03/2008 Elsevier Interactive Patient Education [employer].

## 2015-06-07 NOTE — ED Provider Notes (Signed)
CSN: [id number]     Arrival date & time 06/07/15  1303 History   None    Chief Complaint  Patient presents with  . Ankle Pain    right    HPI   49 ? Chronic chest pain + microvascular angina-normal coronaries by several caths History of reflux Hypothyroidism History PVCs Stable chronic diastolic heart failure-recent echo 08/2014 however no valvular abnormality, no LV dysfunction HTN  States that rolled her right ankle 14 days ago when she stepped over a log and her Hall. She's been limping for the past 2 weeks and has been in pain She usually takes Naprosyn for pain but was taken off of NSAIDs I her gastroenterologist who did a scope on her as it on gastric polyps She states the pain is at baseline 7/10 She has had some swelling of the lateral aspect of the ankle and this hurts her every evening Tylenol provides no relief She is able to bear weight but not full weight on it   Past Medical History  Diagnosis Date  . HTN (hypertension)   . Seizure disorder ([place])     a. since childhood. b. s/p vagal nerve stimulator at [facility].  . Migraine   . Asthma   . Hypothyroid   . Obesity   . MI (myocardial infarction) ([place])   . Complication of anesthesia     hard to wake up-had to be reminded to breath  . Hx of cardiovascular stress test     Lex Myoview 8/14:  Normal, EF 74%  . Chronic chest pain     ?Microvascular angina vs spasm - a. Abnl stress [place] 2008, f/u cath reportedly nl. b. ETT-Myoview 04/2011 - EKG changes but normal perfusion. Cor CT - no coronary calcium, no definite stenosis though mRCA not fully evaluated. c. 10/2011 - tn elevated in [place], LHC without CAD. Started on Ranexa, anti-anginals ?microvascular dz but later stopped while in hospital on abx.  Marland Kitchen MRSA infection     a. After vagal nerve stimulator at [facility] - surgical site MRSA infection, PICC placed.  . Biliary dyskinesia     a. s/p cholecystectomy.  Marland Kitchen GERD (gastroesophageal reflux disease)     a. Severe.   . Seizures ([place])   . Hx of echocardiogram     Echo 3/16:  Mild LVH, EF 55-60%, Gr 1 DD, trivial MR, mild LAE, normal RVF  . Palpitations     a. 08/2014: 48 hour holter with 2 PVCs otherwise normal.   Past Surgical History  Procedure Laterality Date  . Cardiac catheterization      [facility]   . Cesarean section      placement of vagal nerve stimulator.  . Implantation vagal nerve stimulator  [id number]    [facility]  . Cholecystectomy N/A 01/24/2013    Procedure: LAPAROSCOPIC CHOLECYSTECTOMY;  Surgeon: Harl Bowie, MD;  Location: [facility];  Service: General;  Laterality: N/A;   Family History  Problem Relation Age of Onset  . Coronary artery disease Mother 77  . Coronary artery disease Maternal Grandmother   . Cancer Other   . Hypertension Other   . Stroke Other    Social History  Substance Use Topics  . Smoking status: Never Smoker   . Smokeless tobacco: Never Used  . Alcohol Use: 0.0 oz/week    1-3 Glasses of wine per week     Comment: occasional   OB History    No data available  Review of Systems  - Chest pain - Nausea - Vomiting - Blurred vision - Double vision  Allergies  Amantadines; Amitriptyline; Amlodipine; Aspirin; Latex; and Simvastatin  Home Medications   Prior to Admission medications   Medication Sig Start Date End Date Taking? Authorizing Provider  albuterol (PROAIR HFA) 108 (90 BASE) MCG/ACT inhaler Inhale 2 puffs into the lungs every 4 (four) hours as needed.    Historical Provider, MD  Coenzyme Q10 200 MG TABS Take 1 tablet by mouth every evening.  02/07/12   Larey Dresser, MD  Cyanocobalamin (VITAMIN B-12 IJ) Inject 1,000 mcg as directed every 28 (twenty-eight) days.    Historical Provider, MD  fluticasone (CUTIVATE) 0.05 % cream Apply topically 2 (two) times daily. 12/01/14   Billy Fischer, MD  fluticasone (FLONASE) 50 MCG/ACT nasal spray Place 1 spray into both nostrils 2 (two) times daily.  04/17/14   Historical Provider, MD  furosemide (LASIX) 20 MG tablet Take 1 tablet (20 mg total) by mouth daily. 02/25/15   Dayna N Dunn, PA-C  isosorbide mononitrate (IMDUR) 60 MG 24 hr tablet TAKE 1 TABLET (60 MG TOTAL) BY MOUTH 2 (TWO) TIMES DAILY. 11/11/14   Larey Dresser, MD  levothyroxine (SYNTHROID, LEVOTHROID) 50 MCG tablet Take 50 mcg by mouth daily.     Historical Provider, MD  losartan (COZAAR) 25 MG tablet Take 1 tablet (25 mg total) by mouth daily. 02/25/15   Dayna N Dunn, PA-C  methocarbamol (ROBAXIN) 500 MG tablet Take 1 tablet (500 mg total) by mouth 2 (two) times daily. 10/10/13   Ignacia Felling, PA-C  metoprolol succinate (TOPROL-XL) 50 MG 24 hr tablet Take 1 tablet (50 mg total) by mouth 2 (two) times daily. Take with or immediately following a meal. 05/20/15   Larey Dresser, MD  montelukast (SINGULAIR) 10 MG tablet Take 10 mg by mouth at bedtime. 1 TAB QHS 04/17/14   Historical Provider, MD  naproxen (NAPROSYN) 500 MG tablet Take 500 mg by mouth 2 (two) times daily. 02/15/15   Historical Provider, MD  nitroGLYCERIN (NITRODUR - DOSED IN MG/24 HR) 0.2 mg/hr patch PLACE 1 PATCH ONTO THE SKIN DAILY. 09/08/14   Larey Dresser, MD  NITROSTAT 0.4 MG SL tablet PLACE 1 TABLET (0.4 MG TOTAL) UNDER THE TONGUE EVERY 5 (FIVE) MINUTES AS NEEDED FOR CHEST PAIN. 02/09/15   Larey Dresser, MD  omeprazole (PRILOSEC) 40 MG capsule Take 1 capsule by mouth daily. 11/15/14   Historical Provider, MD  promethazine (PHENERGAN) 25 MG tablet Take 25 mg by mouth as needed (for nausea). 1 TAB PO Q 4-6 HRS    Historical Provider, MD  ranitidine (ZANTAC) 150 MG tablet Take 150 mg by mouth 2 (two) times daily. 05/02/14   Historical Provider, MD  telmisartan (MICARDIS) 40 MG tablet Take 1 tablet by mouth daily. 11/08/14   Historical Provider, MD  topiramate (TOPAMAX) 100 MG tablet Take 200 mg by mouth 2 (two) times daily.    Historical Provider, MD  verapamil (CALAN) 120 MG tablet Take 120 mg by mouth 3 (three) times  daily. 02/05/15   Historical Provider, MD   Meds Ordered and Administered this Visit  Medications - No data to display  BP 123/81 mmHg  Pulse 80  Temp(Src) 98.2 F (36.8 C) (Oral)  Resp 18  SpO2 100% No data found.   Physical Exam  Obese pleasant EOMI NCAT Mallampati 3 Chest clinically clear S1-S2 slightly tachycardic Abdomen soft nontender nondistended no rebound Right ankle is diffusely  swollen over the right malleolus Inversion and eversion causes pain over the medial malleolus tarsal tunnel seems okay   ED Course  Procedures (including critical care time)  Labs Review Labs Reviewed - No data to display  Imaging Review No results found.   Visual Acuity Review  Right Eye Distance:   Left Eye Distance:   Bilateral Distance:    Right Eye Near:   Left Eye Near:    Bilateral Near:         MDM  No diagnosis found.  Likely sprain of right ankle we will attempt to get x-rays of the head to rule out occult fracture as it has been 2 weeks X-ray machine here at urgent care is down We will attempt to send the patient for x-rays  Ankle XR IMPRESSION: Negative for fracture.   We will prescribe selective Cox 2 inhibitor Mobitz to be used twice a day with food I will place patient in a splint   Verneita Griffes, MD [facility] ([phone]      Nita Sells, MD 06/25/15 [phone]

## 2015-06-07 NOTE — ED Notes (Signed)
Patient states she was getting out of her car about two  Weeks ago and feel twisting/sprained right ankle Patient states it is not getting any better-sore and swollen

## 2015-06-11 DIAGNOSIS — R635 Abnormal weight gain: Secondary | ICD-10-CM | POA: Insufficient documentation

## 2015-06-11 DIAGNOSIS — Z6841 Body Mass Index (BMI) 40.0 and over, adult: Secondary | ICD-10-CM

## 2015-06-11 HISTORY — DX: Body Mass Index (BMI) 40.0 and over, adult: Z684

## 2015-09-28 ENCOUNTER — Other Ambulatory Visit: Payer: Self-pay | Admitting: Cardiology

## 2015-11-08 DIAGNOSIS — F332 Major depressive disorder, recurrent severe without psychotic features: Secondary | ICD-10-CM | POA: Diagnosis not present

## 2016-01-20 DIAGNOSIS — E039 Hypothyroidism, unspecified: Secondary | ICD-10-CM | POA: Diagnosis not present

## 2016-01-20 DIAGNOSIS — E7800 Pure hypercholesterolemia, unspecified: Secondary | ICD-10-CM | POA: Diagnosis not present

## 2016-01-20 DIAGNOSIS — G40309 Generalized idiopathic epilepsy and epileptic syndromes, not intractable, without status epilepticus: Secondary | ICD-10-CM | POA: Diagnosis not present

## 2016-01-20 DIAGNOSIS — L732 Hidradenitis suppurativa: Secondary | ICD-10-CM | POA: Diagnosis not present

## 2016-01-20 DIAGNOSIS — I10 Essential (primary) hypertension: Secondary | ICD-10-CM | POA: Diagnosis not present

## 2016-01-20 DIAGNOSIS — M545 Low back pain: Secondary | ICD-10-CM | POA: Diagnosis not present

## 2016-01-20 DIAGNOSIS — E119 Type 2 diabetes mellitus without complications: Secondary | ICD-10-CM | POA: Diagnosis not present

## 2016-01-20 DIAGNOSIS — Z713 Dietary counseling and surveillance: Secondary | ICD-10-CM | POA: Diagnosis not present

## 2016-01-20 DIAGNOSIS — I2510 Atherosclerotic heart disease of native coronary artery without angina pectoris: Secondary | ICD-10-CM | POA: Diagnosis not present

## 2016-01-20 DIAGNOSIS — D529 Folate deficiency anemia, unspecified: Secondary | ICD-10-CM | POA: Diagnosis not present

## 2016-01-20 DIAGNOSIS — J4520 Mild intermittent asthma, uncomplicated: Secondary | ICD-10-CM | POA: Diagnosis not present

## 2016-01-20 DIAGNOSIS — M25551 Pain in right hip: Secondary | ICD-10-CM | POA: Diagnosis not present

## 2016-01-20 DIAGNOSIS — E559 Vitamin D deficiency, unspecified: Secondary | ICD-10-CM | POA: Diagnosis not present

## 2016-01-20 DIAGNOSIS — D649 Anemia, unspecified: Secondary | ICD-10-CM | POA: Diagnosis not present

## 2016-01-27 ENCOUNTER — Ambulatory Visit: Payer: Medicare Other | Admitting: Cardiology

## 2016-02-14 DIAGNOSIS — F332 Major depressive disorder, recurrent severe without psychotic features: Secondary | ICD-10-CM | POA: Diagnosis not present

## 2016-02-24 ENCOUNTER — Ambulatory Visit: Payer: Medicare Other | Admitting: Cardiology

## 2016-03-06 ENCOUNTER — Ambulatory Visit (INDEPENDENT_AMBULATORY_CARE_PROVIDER_SITE_OTHER): Payer: Self-pay | Admitting: Cardiology

## 2016-03-06 ENCOUNTER — Encounter (INDEPENDENT_AMBULATORY_CARE_PROVIDER_SITE_OTHER): Payer: Self-pay

## 2016-03-06 ENCOUNTER — Encounter: Payer: Self-pay | Admitting: Cardiology

## 2016-03-06 VITALS — BP 160/82 | HR 98 | Ht 65.5 in | Wt 263.8 lb

## 2016-03-06 DIAGNOSIS — R0600 Dyspnea, unspecified: Secondary | ICD-10-CM

## 2016-03-06 DIAGNOSIS — I5032 Chronic diastolic (congestive) heart failure: Secondary | ICD-10-CM

## 2016-03-06 DIAGNOSIS — R0609 Other forms of dyspnea: Secondary | ICD-10-CM

## 2016-03-06 DIAGNOSIS — I10 Essential (primary) hypertension: Secondary | ICD-10-CM

## 2016-03-06 LAB — BASIC METABOLIC PANEL
BUN: 13 mg/dL (ref 7–25)
CO2: 26 mmol/L (ref 20–31)
Calcium: 9.1 mg/dL (ref 8.6–10.4)
Chloride: 109 mmol/L (ref 98–110)
Creat: 0.82 mg/dL (ref 0.50–1.05)
Glucose, Bld: 97 mg/dL (ref 65–99)
Potassium: 4 mmol/L (ref 3.5–5.3)
Sodium: 143 mmol/L (ref 135–146)

## 2016-03-06 LAB — BRAIN NATRIURETIC PEPTIDE: Brain Natriuretic Peptide: <4 pg/mL (ref <100)

## 2016-03-06 MED ORDER — METOPROLOL SUCCINATE ER 50 MG PO TB24: ORAL_TABLET | ORAL | 1 refills | Status: DC

## 2016-03-06 MED ORDER — LOSARTAN POTASSIUM 50 MG PO TABS: 50.0000 mg | ORAL_TABLET | Freq: Every day | ORAL | 1 refills | Status: DC

## 2016-03-06 NOTE — Progress Notes (Signed)
At 160/82. In retrospect she reports that her blood pressure has been elevated on multiple occasions   03/06/2016 [name]   Aug 05, 1965  [id number]  Primary Physician Maisie Fus, MD Primary Cardiologist: Dr. Shirlee Latch    Reason for Visit/CC: Chest Pain, dyspnea and bilateral LEE  HPI:  50 y/o female, followed by Dr. Shirlee Latch,  who has a h/o CP. Multiple episodes over the years. She has undergone extensive cardiac w/u in the past, that has been fairly normal, including the following. She had an abnormal stress test in 2008 in [place] and it sounds like she went to [facility] for a cardiac catheterization which, per her report, was normal.   Stress echo at [facility] in 2010 was submaximal but showed no evidence for ischemia.  ETT-myoview (11/12): 7'15", EF 66%, significant ST depression on exercise ECG, apical thinning on myoview images but no evidence for ischemia. Coronary CT angiogram (11/12): calcium score 0, difficult study with gating artifact, mid RCA nonevaluatable but no stenosis elsewhere in the coronaries.   Echo (11/12): EF 55-60%, mild LV hypertrophy, mild MR.  NSTEMI in [place] in 5/13 with elevated troponin but LHC showed normal coronaries.  Echo (5/13) with EF 60-65%, normal valve, no LVH.  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  Cardiolite (8/14) with EF 74%, no ischemia or infarction.  Echo (3/16) with EF 55-60%, mild LVH, normal RV size and systolic function.   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  She is not taking amlodipine due to lower extremity swelling and is not taking ranolazine since it seemed ineffective.  She is off aspirin with gastric polyps. She has been treated with metoprolol, verapamil, and Imdur. She is also intolerant to statins given issues with myalgias. She was last seen in clinic by Dr. Shirlee Latch, 05/22/15, and was felt to be stable from a cardiac standpoint,   She now presents back to  clinic with multiple complaints, including recurrent CP, dyspnea and bilateral leg edema. Her chest pain is similar to previous chest pain episodes. She has substernal chest pressure/tightness that feels as though she's having a heart attack. She has had several episodes over the past several weeks. Symptoms can occur at rest as well as with exertion. She also reports exertional dyspnea. She reports that several weeks ago, she had acute fluid retention with bilateral LEE. She reports that she gained almost 40 pounds above her baseline. During that time she was seen by her pulmonologist and it was noted that she had fluid in her lungs. Her Pulmonologist instructed her to increase her Lasix. She reports improvement with the increased dose of Lasix and swelling resolved. She denies any resting chest pain and no resting dyspnea in clinic today. Her blood pressure is high today at 160/82. In retrospect, she reports that her blood pressure has been elevated on multiple occasions within the past month. She recalls one reading of 192/74 and another reading of 174/109.  She reports full medication compliance. She denies any dietary indiscretion with sodium. Her medications have been reviewed and it appears that, for some reason, she is on 2 ARB's, losartan and Micardis. She reports her Micardis was recently added about 6 months ago by one of her doctors. In addition to ARBs,  she is also on a diuretic, beta blocker,nitrate and calcium channel blocker.  She is currently CP free in clinic today. EKG shows new slight, inferolateral Twave inversions, not present on prior  EKGs.  Current Meds  Medication Sig  . albuterol (PROAIR HFA) 108 (90 BASE) MCG/ACT inhaler Inhale 2 puffs into the lungs every 4 (four) hours as needed.  . Coenzyme Q10 200 MG TABS Take 1 tablet by mouth every evening.   . fluticasone (CUTIVATE) 0.05 % cream Apply topically 2 (two) times daily.  . fluticasone (FLONASE) 50 MCG/ACT nasal spray Place 1  spray into both nostrils 2 (two) times daily.  . furosemide (LASIX) 20 MG tablet Take 1 tablet (20 mg total) by mouth daily.  . isosorbide mononitrate (IMDUR) 60 MG 24 hr tablet TAKE 1 TABLET (60 MG TOTAL) BY MOUTH 2 (TWO) TIMES DAILY.  Marland Kitchen levothyroxine (SYNTHROID, LEVOTHROID) 50 MCG tablet Take 50 mcg by mouth daily.   Marland Kitchen losartan (COZAAR) 25 MG tablet Take 1 tablet (25 mg total) by mouth daily.  . metoprolol succinate (TOPROL-XL) 50 MG 24 hr tablet Take 1 tablet (50 mg total) by mouth 2 (two) times daily. Take with or immediately following a meal.  . mometasone-formoterol (DULERA) 100-5 MCG/ACT AERO Inhale 2 puffs into the lungs 2 (two) times daily.  . montelukast (SINGULAIR) 10 MG tablet Take 10 mg by mouth at bedtime. 1 TAB QHS  . nitroGLYCERIN (NITRODUR - DOSED IN MG/24 HR) 0.2 mg/hr patch PLACE 1 PATCH ONTO THE SKIN DAILY.  . nitroGLYCERIN (NITROSTAT) 0.4 MG SL tablet PLACE 1 TABLET (0.4 MG TOTAL) UNDER THE TONGUE EVERY 5 (FIVE) MINUTES AS NEEDED FOR CHEST PAIN.  Marland Kitchen omeprazole (PRILOSEC) 40 MG capsule Take 1 capsule by mouth daily.  . promethazine (PHENERGAN) 25 MG tablet Take 25 mg by mouth as needed (for nausea). 1 TAB PO Q 4-6 HRS  . ranitidine (ZANTAC) 150 MG tablet Take 150 mg by mouth 2 (two) times daily.  Marland Kitchen telmisartan (MICARDIS) 40 MG tablet Take 1 tablet by mouth daily.  Marland Kitchen topiramate (TOPAMAX) 100 MG tablet Take 200 mg by mouth 2 (two) times daily.  . traZODone (DESYREL) 50 MG tablet Take 50 mg by mouth at bedtime.  Marland Kitchen venlafaxine (EFFEXOR) 75 MG tablet Take 75 mg by mouth 2 (two) times daily.  . verapamil (CALAN) 120 MG tablet Take 120 mg by mouth 3 (three) times daily.   Allergies  Allergen Reactions  . Amantadines Swelling  . Amitriptyline Swelling  . Amlodipine Swelling  . Aspirin Nausea Only    Irritates stomach also/hx of stomach ulcers  . Latex Hives, Itching and Swelling    Burning also  . Simvastatin Swelling    Muscle pains also  . Tramadol Hives   Past Medical  History:  Diagnosis Date  . Asthma   . Biliary dyskinesia    a. s/p cholecystectomy.  . 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 Abnl stress [place] 2008, f/u cath reportedly nl. b. ETT-Myoview 04/2011 - EKG changes but normal perfusion. Cor CT - no coronary calcium, no definite stenosis though mRCA not fully evaluated. c. 10/2011 - tn elevated in [place], LHC without CAD. Started on Ranexa, anti-anginals ?microvascular dz but later stopped while in hospital on abx.  . Complication of anesthesia    hard to wake up-had to be reminded to breath  . GERD (gastroesophageal reflux disease)    a. Severe.  Marland Kitchen HTN (hypertension)   . Hx of cardiovascular stress test    Lex Myoview 8/14:  Normal, EF 74%  . Hx of echocardiogram    Echo 3/16:  Mild LVH, EF 55-60%, Gr 1 DD, trivial MR, mild LAE, normal  RVF  . Hypothyroid   . MI (myocardial infarction)   . Migraine   . MRSA infection    a. After vagal nerve stimulator at [facility] - surgical site MRSA infection, PICC placed.  . Obesity   . Palpitations    a. 08/2014: 48 hour holter with 2 PVCs otherwise normal.  . Seizure disorder ([facility])    a. since childhood. b. s/p vagal nerve stimulator at [facility].  . Seizures ([facility])    Family History  Problem Relation Age of Onset  . Coronary artery disease Mother 19  . Coronary artery disease Maternal Grandmother   . Cancer Other   . Hypertension Other   . Stroke Other    Past Surgical History:  Procedure Laterality Date  . CARDIAC CATHETERIZATION     [facility] [address]   . CESAREAN SECTION     placement of vagal nerve stimulator.  . CHOLECYSTECTOMY N/A 01/24/2013   Procedure: LAPAROSCOPIC CHOLECYSTECTOMY;  Surgeon: Shelly Rubenstein, MD;  Location:  SUR[facility]ERY CENTER;  Service: General;  Laterality: N/A;  . IMPLANTATION VAGAL NERVE STIMULATOR  [phone]   [facility]attery [facility]   Social History   Social History  . Marital status: Single    Spouse name: N/A  . Number of  children: 2  . Years of education: N/A   Occupational History  . Not on file.   Social History Main Topics  . Smoking status: Never Smoker  . Smokeless tobacco: Never Used  . Alcohol use 0.0 oz/week    1 - 3 Glasses of wine per week     Comment: occasional  . Drug use: No  . Sexual activity: Yes    Birth control/ protection: None   Other Topics Concern  . Not on file   Social History Narrative   Lives alone     Review of Systems: General: negative for chills, fever, night sweats or weight changes.  Cardiovascular: negative for chest pain, dyspnea on exertion, edema, orthopnea, palpitations, paroxysmal nocturnal dyspnea or shortness of breath Dermatological: negative for rash Respiratory: negative for cough or wheezing Urologic: negative for hematuria Abdominal: negative for nausea, vomiting, diarrhea, bright red blood per rectum, melena, or hematemesis Neurologic: negative for visual changes, syncope, or dizziness All other systems reviewed and are otherwise negative except as noted above.   Physical Exam:  Blood pressure (!) 160/82, pulse 98, height 5' 5.5" (1.664 m), weight 263 lb 12.8 oz (119.7 kg).  General appearance: alert, cooperative, no distress and moderately obese  Neck: no carotid bruit and no JVD Lungs: clear to auscultation bilaterally Heart: regular rate and rhythm, S1, S2 normal, no murmur, click, rub or gallop Extremities: no LEE Pulses: 2+ and symmetric Skin: warm and dry Neurologic: Grossly normal  EKG NSR with new inferolateral, slight TWIs, not present on prior EKGs.   ASSESSMENT AND PLAN:   1. Chest Pain: symptoms are similar to prior CP episodes. ? microvascular disease vs vasospasm vs new development of obstructive CAD. She now has new TWIs in the inferolateral leads, not present on prior EKGs. She is CP free in clinic today. She has multiple risk factors, and is intolerant to statins and ASA. We will obtain a NST to r/o new development of  coronary ischemia/CAD. Continue BB. Will increase metoprolol to 75 mg BID for added BP control along with its antianginal properties.   2. Presumed CHF Exacerbation: pt describes what appeared to be recent acute CHF exacerbation. She notes recent episode of increased dyspnea the setting  of fluid retention, weight gain and LEE, relieved with increase in her home diuretic regimen.  Her symptoms improved and lower LEE resolved. She still has exertional dyspnea.  She does not appeare to be grossly volume overloaded on physical exam today. Her EF in the past has been normal. We will check a BNP today and will arrange for f/u 2D echo to assess for changes in LVF. This could possibly be diastolic dysfunction, exacerbated by elevated BPs. We will increase her BB as outlined above for elevated BP and HR (resting HR in the upper 90s). Low sodium diet advised. Continue daily lasix.  3.   Hypertension:  Poorly controlled at 160/82. Patient reports that systolic blood pressure recently has been as high as the 190s and diastolic pressures as low as the low 100s. Her medications have been reviewed. It appears that for some reason she is on 2 ARB's, losartan and Micardis . The most recent to be prescribed was the Micardis, which was prescribed about 6 months ago. I will discontinue her Micardis and continue losartan. We will increase the dose of her losartan from 25 mg to 50 g daily. We will check a BMP today to ensure that renal function and K are both ok. Also given complaints of exertional chest pain and concerns for diastolic/possible systolic dysfunction, we will increase her beta blocker further. Increase Metoprolol to 75 mg twice a day. Patient advised to avoid salt.   PLAN  F/u in 2 weeks after studies are complete. Pt may f/u in HTN clinic with pharmacist for f/u for BP and mediation monitoring/ titration.   Rufus Beske PA-C 03/06/2016 10:40 AM

## 2016-03-06 NOTE — Patient Instructions (Addendum)
Medication Instructions:   1. STOP TAKING MICARDIS 40 MG   2. START TAKING LOSARTAN 50 MG ONCE A DAY   3. START TAKING  METOPROLOL 75 MG ( A TABLET AND HALF )      TWICE A DAY    If you need a refill on your cardiac medications before your next appointment, please call your pharmacy.  Labwork: BNP AND BMP    Testing/Procedures: Your physician has requested that you have a lexiscan myoview. For further information please visit [email or web address]. Please follow instruction sheet, as given.  Your physician has requested that you have an echocardiogram. Echocardiography is a painless test that uses sound waves to create images of your heart. It provides your doctor with information about the size and shape of your heart and how well your heart's chambers and valves are working. This procedure takes approximately one hour. There are no restrictions for this procedure.    Follow-Up: IN ONE WEEK  AFTER ECHO AND STRESS TEST  WITH PHARM D FOR BLOOD PRESSURE AND MEDICATIONS FOLLOW UP    Any Other Special Instructions Will Be Listed Below (If Applicable).

## 2016-03-08 ENCOUNTER — Telehealth: Payer: Self-pay | Admitting: *Deleted

## 2016-03-08 NOTE — Telephone Encounter (Signed)
SPOKE TO PT ABOUT RESULTS AND VERBALIZED UNDESTANDING

## 2016-03-08 NOTE — Telephone Encounter (Signed)
-----   Message from Consuelo Pandy, [place] sent at 03/07/2016  4:08 PM EDT ----- Labs including kidney function and potassium levels look ok. Continue current dose of lasix. No need to increase at this time. Please notify patient.

## 2016-03-09 DIAGNOSIS — F332 Major depressive disorder, recurrent severe without psychotic features: Secondary | ICD-10-CM | POA: Diagnosis not present

## 2016-03-16 ENCOUNTER — Telehealth (HOSPITAL_COMMUNITY): Payer: Self-pay | Admitting: *Deleted

## 2016-03-16 NOTE — Telephone Encounter (Signed)
Left message on voicemail per DPR in reference to upcoming appointment scheduled on 03/20/16 with detailed instructions given per Myocardial Perfusion Study Information Sheet for the test. LM to arrive 15 minutes early, and that it is imperative to arrive on time for appointment to keep from having the test rescheduled. If you need to cancel or reschedule your appointment, please call the office within 24 hours of your appointment. Failure to do so may result in a cancellation of your appointment, and a $50 no show fee. Phone number given for call back for any questions.  Loryn Haacke Jacqueline    

## 2016-03-20 ENCOUNTER — Ambulatory Visit (HOSPITAL_COMMUNITY): Payer: Self-pay

## 2016-03-21 ENCOUNTER — Ambulatory Visit (HOSPITAL_COMMUNITY): Payer: Self-pay

## 2016-03-21 ENCOUNTER — Other Ambulatory Visit (HOSPITAL_COMMUNITY): Payer: Self-pay

## 2016-03-24 ENCOUNTER — Ambulatory Visit (INDEPENDENT_AMBULATORY_CARE_PROVIDER_SITE_OTHER): Payer: Self-pay | Admitting: Orthopaedic Surgery

## 2016-03-27 NOTE — Progress Notes (Signed)
Patient ID: [name]                 DOB: [birth year]                      MRN: [id number]     HPI: [name] is a 50 y.o. female patient of Dr Shirlee Latch referred to HTN clinic by Robbie Lis. PMH is significant for multiple episodes of chest pain, NSTEMI in [place] in May 2013 with elevated troponin but normal coronaries and LVEF, and HTN. Her BP was elevated at last OV 3 weeks ago and her metoprolol dose was increased to help with BP and angina, losartan was increased ot 50mg  daily, and duplicate ARB therapy (telmisartan) was discontinued. Pt presents today for BP follow up with her daughter.  Pt reports that her home BP readings have improved over the past week or two. States that her systolic BP has mostly been in the 140s. She has been using her nitroglycerin patch more frequently for chest pain. She has an upcoming myocardial perfusion study to evaluate this.  Of note, patient does take venlafaxine which is known to increase blood pressure.  Current HTN meds: losartan 50mg  daily, Toprol 75mg  BID, verapamil 120mg  TID, furosemide 20mg  daily, Imdur 60mg  BID Previously tried: amlodipine - swelling BP goal: <140/48mmHg  Family History: Mother and maternal grandmother with CAD.  Social History: Patient denies smoking and illicit drug use. Drinks 1-3 glasses of wine per week.  Diet: Bakes chicken and vegetables, yogurt parfaits. Baked fish, beans, cabbage and corn. Uses salt free seasoning and Mrs Sharilyn Sites. Cooks with olive oil. Drinks almond milk. Goes to [employer].  Exercise: Walking and exercise tapes occasionally - wants to restart routine.    Wt Readings from Last 3 Encounters:  03/06/16 263 lb 12.8 oz (119.7 kg)  05/20/15 258 lb (117 kg)  02/25/15 253 lb 12.8 oz (115.1 kg)   BP Readings from Last 3 Encounters:  03/06/16 (!) 160/82  06/07/15 123/81  05/20/15 140/70   Pulse Readings from Last 3 Encounters:  03/06/16 98  06/07/15 80  05/20/15 93    Renal  function: CrCl cannot be calculated (Unknown ideal weight.).  Past Medical History:  Diagnosis Date  . Asthma   . Biliary dyskinesia    a. s/p cholecystectomy.  . 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 Abnl stress [place] 2008, f/u cath reportedly nl. b. ETT-Myoview 04/2011 - EKG changes but normal perfusion. Cor CT - no coronary calcium, no definite stenosis though mRCA not fully evaluated. c. 10/2011 - tn elevated in [place], LHC without CAD. Started on Ranexa, anti-anginals ?microvascular dz but later stopped while in hospital on abx.  . Complication of anesthesia    hard to wake up-had to be reminded to breath  . GERD (gastroesophageal reflux disease)    a. Severe.  Marland Kitchen HTN (hypertension)   . Hx of cardiovascular stress test    Lex Myoview 8/14:  Normal, EF 74%  . Hx of echocardiogram    Echo 3/16:  Mild LVH, EF 55-60%, Gr 1 DD, trivial MR, mild LAE, normal RVF  . Hypothyroid   . MI (myocardial infarction)   . Migraine   . MRSA infection    a. After vagal nerve stimulator at [facility] - surgical site MRSA infection, PICC placed.  . Obesity   . Palpitations    a. 08/2014: 48 hour holter with 2 PVCs otherwise normal.  .  Seizure disorder (HCC)    a. since childhood. b. s/p vagal nerve stimulator at [facility].  . Seizures [facility])     Current Outpatient Prescriptions on File Prior to Visit  Medication Sig Dispense Refill  . albuterol (PROAIR HFA) 108 (90 BASE) MCG/ACT inhaler Inhale 2 puffs into the lungs every 4 (four) hours as needed.    . Coenzyme Q10 200 MG TABS Take 1 tablet by mouth every evening.   0  . fluticasone (CUTIVATE) 0.05 % cream Apply topically 2 (two) times daily. 30 g 1  . fluticasone (FLONASE) 50 MCG/ACT nasal spray Place 1 spray into both nostrils 2 (two) times daily.    . furosemide (LASIX) 20 MG tablet Take 1 tablet (20 mg total) by mouth daily. 30 tablet 2  . isosorbide mononitrate (IMDUR) 60 MG 24 hr tablet TAKE 1 TABLET (60 MG TOTAL) BY MOUTH 2  (TWO) TIMES DAILY. 60 tablet 6  . levothyroxine (SYNTHROID, LEVOTHROID) 50 MCG tablet Take 50 mcg by mouth daily.     Marland Kitchen losartan (COZAAR) 50 MG tablet Take 1 tablet (50 mg total) by mouth daily. 90 tablet 1  . metoprolol succinate (TOPROL-XL) 50 MG 24 hr tablet TAKE TABLET AND HALF  TWICE A DAY Take with or immediately following a meal. 270 tablet 1  . mometasone-formoterol (DULERA) 100-5 MCG/ACT AERO Inhale 2 puffs into the lungs 2 (two) times daily.    . montelukast (SINGULAIR) 10 MG tablet Take 10 mg by mouth at bedtime. 1 TAB QHS    . nitroGLYCERIN (NITRODUR - DOSED IN MG/24 HR) 0.2 mg/hr patch PLACE 1 PATCH ONTO THE SKIN DAILY. 30 patch 1  . nitroGLYCERIN (NITROSTAT) 0.4 MG SL tablet PLACE 1 TABLET (0.4 MG TOTAL) UNDER THE TONGUE EVERY 5 (FIVE) MINUTES AS NEEDED FOR CHEST PAIN. 25 tablet 3  . omeprazole (PRILOSEC) 40 MG capsule Take 1 capsule by mouth daily.    . promethazine (PHENERGAN) 25 MG tablet Take 25 mg by mouth as needed (for nausea). 1 TAB PO Q 4-6 HRS    . ranitidine (ZANTAC) 150 MG tablet Take 150 mg by mouth 2 (two) times daily.  11  . topiramate (TOPAMAX) 100 MG tablet Take 200 mg by mouth 2 (two) times daily.    . traZODone (DESYREL) 50 MG tablet Take 50 mg by mouth at bedtime.    Marland Kitchen venlafaxine (EFFEXOR) 75 MG tablet Take 75 mg by mouth 2 (two) times daily.    . verapamil (CALAN) 120 MG tablet Take 120 mg by mouth 3 (three) times daily.     No current facility-administered medications on file prior to visit.     Allergies  Allergen Reactions  . Amantadines Swelling  . Amitriptyline Swelling  . Amlodipine Swelling  . Aspirin Nausea Only    Irritates stomach also/hx of stomach ulcers  . Latex Hives, Itching and Swelling    Burning also  . Simvastatin Swelling    Muscle pains also  . Tramadol Hives     Assessment/Plan:  1. Hypertension - BP improved but still above goal <140/47mmHg. Will increase losartan to 100mg  daily and continue other medications. She will  work on exercise to improve her diastolic readings. Advised her to discuss alternatives to venlafaxine with her PCP since this medication is known to increase blood pressure. Follow up in HTN clinic in 3 weeks for BP check and BMET.   Eather Chaires E. Kale Rondeau, PharmD, CPP [facility] [address], [place], [place] [address] Phone: [phone])  [phone]; Fax: [phone] 03/28/2016 11:02 AM

## 2016-03-28 ENCOUNTER — Ambulatory Visit (INDEPENDENT_AMBULATORY_CARE_PROVIDER_SITE_OTHER): Payer: Medicare Other | Admitting: Pharmacist

## 2016-03-28 VITALS — BP 148/102 | HR 82 | Ht 65.5 in | Wt 272.5 lb

## 2016-03-28 DIAGNOSIS — I10 Essential (primary) hypertension: Secondary | ICD-10-CM | POA: Diagnosis not present

## 2016-03-28 DIAGNOSIS — R0609 Other forms of dyspnea: Secondary | ICD-10-CM

## 2016-03-28 DIAGNOSIS — R0600 Dyspnea, unspecified: Secondary | ICD-10-CM

## 2016-03-28 MED ORDER — LOSARTAN POTASSIUM 100 MG PO TABS: 100.0000 mg | ORAL_TABLET | Freq: Every day | ORAL | 3 refills | Status: DC

## 2016-03-28 NOTE — Patient Instructions (Addendum)
It was nice to see you in clinic today.  Your goal blood pressure is less than 140/50mmHg.  Increase your losartan to 100mg  daily. You can take 2 of your 50mg  tablets each day. I will send in a new prescription for losartan 100mg  tablets to start taking 1 tablet daily once you run out.  Start hiking at local parks and use home exercise tapes 5 days a week.  Check your blood pressure at home and write down your readings.  Recheck blood pressure in 3 weeks.

## 2016-03-30 ENCOUNTER — Telehealth (HOSPITAL_COMMUNITY): Payer: Self-pay | Admitting: *Deleted

## 2016-03-30 NOTE — Telephone Encounter (Signed)
Patient given detailed instructions per Myocardial Perfusion Study Information Sheet for the test on 04/03/16 at 1230. Patient notified to arrive 15 minutes early and that it is imperative to arrive on time for appointment to keep from having the test rescheduled.  If you need to cancel or reschedule your appointment, please call the office within 24 hours of your appointment. Failure to do so may result in a cancellation of your appointment, and a $50 no show fee. Patient verbalized understanding.[name], Ranae Palms

## 2016-04-03 ENCOUNTER — Ambulatory Visit (HOSPITAL_COMMUNITY): Payer: Medicare Other | Attending: Cardiology

## 2016-04-03 ENCOUNTER — Other Ambulatory Visit: Payer: Self-pay | Admitting: Cardiology

## 2016-04-03 ENCOUNTER — Encounter (INDEPENDENT_AMBULATORY_CARE_PROVIDER_SITE_OTHER): Payer: Self-pay

## 2016-04-03 VITALS — Ht 65.5 in | Wt 263.0 lb

## 2016-04-03 DIAGNOSIS — R0609 Other forms of dyspnea: Secondary | ICD-10-CM

## 2016-04-03 DIAGNOSIS — R079 Chest pain, unspecified: Secondary | ICD-10-CM

## 2016-04-03 DIAGNOSIS — R0600 Dyspnea, unspecified: Secondary | ICD-10-CM

## 2016-04-03 MED ORDER — REGADENOSON 0.4 MG/5ML IV SOLN
0.4000 mg | Freq: Once | INTRAVENOUS | Status: AC
  Administered 2016-04-03: 0.4 mg via INTRAVENOUS

## 2016-04-03 MED ORDER — TECHNETIUM TC 99M TETROFOSMIN IV KIT
32.9000 | PACK | Freq: Once | INTRAVENOUS | Status: AC | PRN
  Administered 2016-04-03: 32.9 via INTRAVENOUS
  Filled 2016-04-03: qty 33

## 2016-04-04 ENCOUNTER — Other Ambulatory Visit: Payer: Self-pay

## 2016-04-04 ENCOUNTER — Ambulatory Visit (HOSPITAL_COMMUNITY): Payer: Medicare Other | Attending: Cardiology

## 2016-04-04 ENCOUNTER — Ambulatory Visit (HOSPITAL_COMMUNITY): Payer: Self-pay

## 2016-04-04 ENCOUNTER — Ambulatory Visit (HOSPITAL_COMMUNITY): Payer: Medicare Other

## 2016-04-04 DIAGNOSIS — I5030 Unspecified diastolic (congestive) heart failure: Secondary | ICD-10-CM | POA: Insufficient documentation

## 2016-04-04 DIAGNOSIS — R0600 Dyspnea, unspecified: Secondary | ICD-10-CM

## 2016-04-04 DIAGNOSIS — I371 Nonrheumatic pulmonary valve insufficiency: Secondary | ICD-10-CM | POA: Insufficient documentation

## 2016-04-04 DIAGNOSIS — R0609 Other forms of dyspnea: Secondary | ICD-10-CM

## 2016-04-04 LAB — MYOCARDIAL PERFUSION IMAGING
LV dias vol: 103 mL (ref 46–106)
LV sys vol: 53 mL
Peak HR: 98 {beats}/min
RATE: 0.37
Rest HR: 83 {beats}/min
SDS: 4
SRS: 7
SSS: 11
TID: 1.19

## 2016-04-04 MED ORDER — TECHNETIUM TC 99M TETROFOSMIN IV KIT
32.4000 | PACK | Freq: Once | INTRAVENOUS | Status: AC | PRN
  Administered 2016-04-04: 32.4 via INTRAVENOUS
  Filled 2016-04-04: qty 33

## 2016-04-06 ENCOUNTER — Telehealth: Payer: Self-pay | Admitting: Cardiology

## 2016-04-06 NOTE — Telephone Encounter (Signed)
New message  Pt called and wants test results sent to pulmonary doctor, Dr. Soledad Gerlach @ [facility], and to Dr. Bufford Lope, primary care doctor @ [place] [facility]

## 2016-04-06 NOTE — Telephone Encounter (Signed)
I faxed reports through EPIC to PCP and [facility] has Care Everywhere so they can see the reports.  I advised patient. She voiced understanding and thanks.

## 2016-04-17 ENCOUNTER — Encounter (HOSPITAL_COMMUNITY): Payer: Self-pay | Admitting: Emergency Medicine

## 2016-04-17 ENCOUNTER — Ambulatory Visit (HOSPITAL_COMMUNITY)
Admission: EM | Admit: 2016-04-17 | Discharge: 2016-04-17 | Disposition: A | Discharge status: Home / Self Care | Payer: Medicare Other | Attending: Emergency Medicine | Admitting: Emergency Medicine

## 2016-04-17 DIAGNOSIS — J060 Acute laryngopharyngitis: Secondary | ICD-10-CM

## 2016-04-17 MED ORDER — DEXAMETHASONE SODIUM PHOSPHATE 10 MG/ML IJ SOLN
INTRAMUSCULAR | Status: AC
  Filled 2016-04-17: qty 1

## 2016-04-17 MED ORDER — POLYMYXIN B-TRIMETHOPRIM 10000-0.1 UNIT/ML-% OP SOLN: 1.0000 [drp] | Freq: Four times a day (QID) | OPHTHALMIC | 0 refills | Status: DC

## 2016-04-17 MED ORDER — DEXAMETHASONE SODIUM PHOSPHATE 10 MG/ML IJ SOLN
10.0000 mg | Freq: Once | INTRAMUSCULAR | Status: AC
  Administered 2016-04-17: 10 mg via INTRAMUSCULAR

## 2016-04-17 NOTE — ED Provider Notes (Signed)
[place]    CSN: [id number] Arrival date & time: 04/17/16  1131     History   Chief Complaint Chief Complaint  Patient presents with  . URI  . Conjunctivitis    bilateral    HPI [name] is a 50 y.o. female.   HPI  She is a 50 year old woman here for evaluation of upper respiratory symptoms. Her symptoms started about 3 days ago and have progressively been getting worse. Initially she just had some runny nose and cough. The next day, she developed body aches and fatigue. Currently, she has loss of voice and sore throat. She also has some nasal congestion and rhinorrhea. She is sneezing and coughing. No fevers. No shortness of breath. No nausea or vomiting. She also reports redness and irritation of both of her eyes. The left eye was crusted this morning. She was exposed to her sick grandchildren last week.  Past Medical History:  Diagnosis Date  . Asthma   . Biliary dyskinesia    a. s/p cholecystectomy.  . 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 Abnl stress [place] 2008, f/u cath reportedly nl. b. ETT-Myoview 04/2011 - EKG changes but normal perfusion. Cor CT - no coronary calcium, no definite stenosis though mRCA not fully evaluated. c. 10/2011 - tn elevated in [place], LHC without CAD. Started on Ranexa, anti-anginals ?microvascular dz but later stopped while in hospital on abx.  . Complication of anesthesia    hard to wake up-had to be reminded to breath  . GERD (gastroesophageal reflux disease)    a. Severe.  Marland Kitchen HTN (hypertension)   . Hx of cardiovascular stress test    Lex Myoview 8/14:  Normal, EF 74%  . Hx of echocardiogram    Echo 3/16:  Mild LVH, EF 55-60%, Gr 1 DD, trivial MR, mild LAE, normal RVF  . Hypothyroid   . MI (myocardial infarction)   . Migraine   . MRSA infection    a. After vagal nerve stimulator at [facility] - surgical site MRSA infection, PICC placed.  . Obesity   . Palpitations    a. 08/2014: 48 hour holter  with 2 PVCs otherwise normal.  . Seizure disorder ([place])    a. since childhood. b. s/p vagal nerve stimulator at [facility].  . Seizures [facility])     Patient Active Problem List   Diagnosis Date Noted  . Asthma 08/24/2014  . Chronic diastolic heart failure 99991111  . Chest pain 07/02/2013  . Hypothyroidism 07/02/2013  . Hypothyroid   . Chronic chest pain   . GERD (gastroesophageal reflux disease) 02/10/2013  . Biliary dyskinesia 12/30/2012  . Exertional dyspnea 09/19/2012  . Hyperlipidemia 05/17/2011  . Non-cardiac chest pain secondary to GERD 04/24/2011  . Hypertension 04/24/2011    Past Surgical History:  Procedure Laterality Date  . [place]     placement of vagal nerve stimulator.  . CHOLECYSTECTOMY N/A 01/24/2013   Procedure: LAPAROSCOPIC CHOLECYSTECTOMY;  Surgeon: Harl Bowie, MD;  Location: [place];  Service: General;  Laterality: N/A;  . IMPLANTATION VAGAL NERVE STIMULATOR  2000,2013   battery [facility]    OB History    No data available       Home Medications    Prior to Admission medications   Medication Sig Start Date End Date Taking? Authorizing Provider  albuterol (PROAIR HFA) 108 (90 BASE) MCG/ACT inhaler Inhale  2 puffs into the lungs every 4 (four) hours as needed.   Yes Historical Provider, MD  isosorbide mononitrate (IMDUR) 60 MG 24 hr tablet TAKE 1 TABLET (60 MG TOTAL) BY MOUTH 2 (TWO) TIMES DAILY. 11/11/14  Yes Larey Dresser, MD  levothyroxine (SYNTHROID, LEVOTHROID) 50 MCG tablet Take 50 mcg by mouth daily.    Yes Historical Provider, MD  losartan (COZAAR) 100 MG tablet Take 1 tablet (100 mg total) by mouth daily. 03/28/16  Yes Brittainy Erie Noe, PA-C  metoprolol succinate (TOPROL-XL) 50 MG 24 hr tablet TAKE TABLET AND HALF  TWICE A DAY Take with or immediately following a meal. 03/06/16  Yes Brittainy Erie Noe, PA-C  ranitidine (ZANTAC) 150 MG tablet Take 150 mg  by mouth 2 (two) times daily. 05/02/14  Yes Historical Provider, MD  topiramate (TOPAMAX) 100 MG tablet Take 200 mg by mouth 2 (two) times daily.   Yes Historical Provider, MD  traZODone (DESYREL) 50 MG tablet Take 50 mg by mouth at bedtime.   Yes Historical Provider, MD  venlafaxine (EFFEXOR) 75 MG tablet Take 75 mg by mouth 2 (two) times daily.   Yes Historical Provider, MD  verapamil (CALAN) 120 MG tablet Take 120 mg by mouth 3 (three) times daily. 02/05/15  Yes Historical Provider, MD  Coenzyme Q10 200 MG TABS Take 1 tablet by mouth every evening.  02/07/12   Larey Dresser, MD  fluticasone (CUTIVATE) 0.05 % cream Apply topically 2 (two) times daily. 12/01/14   Billy Fischer, MD  fluticasone (FLONASE) 50 MCG/ACT nasal spray Place 1 spray into both nostrils 2 (two) times daily. 04/17/14   Historical Provider, MD  furosemide (LASIX) 20 MG tablet Take 1 tablet (20 mg total) by mouth daily. 02/25/15   Dayna N Dunn, PA-C  mometasone-formoterol (DULERA) 100-5 MCG/ACT AERO Inhale 2 puffs into the lungs 2 (two) times daily. 07/09/15   Historical Provider, MD  montelukast (SINGULAIR) 10 MG tablet Take 10 mg by mouth at bedtime. 1 TAB QHS 04/17/14   Historical Provider, MD  nitroGLYCERIN (NITRODUR - DOSED IN MG/24 HR) 0.2 mg/hr patch PLACE 1 PATCH ONTO THE SKIN DAILY. 09/08/14   Larey Dresser, MD  nitroGLYCERIN (NITROSTAT) 0.4 MG SL tablet PLACE 1 TABLET (0.4 MG TOTAL) UNDER THE TONGUE EVERY 5 (FIVE) MINUTES AS NEEDED FOR CHEST PAIN. 09/28/15   Larey Dresser, MD  omeprazole (PRILOSEC) 40 MG capsule Take 1 capsule by mouth daily. 11/15/14   Historical Provider, MD  promethazine (PHENERGAN) 25 MG tablet Take 25 mg by mouth as needed (for nausea). 1 TAB PO Q 4-6 HRS    Historical Provider, MD  trimethoprim-polymyxin b (POLYTRIM) ophthalmic solution Place 1 drop into both eyes 4 (four) times daily. For 5 days 04/17/16   Melony Overly, MD    Family History Family History  Problem Relation Age of Onset  . Coronary  artery disease Mother 25  . Coronary artery disease Maternal Grandmother   . Cancer Other   . Hypertension Other   . Stroke Other     Social History Social History  Substance Use Topics  . Smoking status: Never Smoker  . Smokeless tobacco: Never Used  . Alcohol use 0.0 oz/week    1 - 3 Glasses of wine per week     Comment: occasional     Allergies   Amantadines; Amitriptyline; Amlodipine; Aspirin; Latex; Simvastatin; and Tramadol   Review of Systems Review of Systems As in history of present illness  Physical  Exam Triage Vital Signs ED Triage Vitals [04/17/16 1235]  Enc Vitals Group     BP 126/80     Pulse Rate 94     Resp 24     Temp 98.5 F (36.9 C)     Temp Source Oral     SpO2 100 %     Weight      Height      Head Circumference      Peak Flow      Pain Score 9     Pain Loc      Pain Edu?      Excl. in [place]?    No data found.   Updated Vital Signs BP 126/80 (BP Location: Left Arm)   Pulse 94   Temp 98.5 F (36.9 C) (Oral)   Resp 24   LMP 04/17/2016 (Exact Date)   SpO2 100%   Visual Acuity Right Eye Distance:   Left Eye Distance:   Bilateral Distance:    Right Eye Near:   Left Eye Near:    Bilateral Near:     Physical Exam  Constitutional: She is oriented to person, place, and time. She appears well-developed and well-nourished. No distress.  Patient speaking in a whisper  HENT:  Mouth/Throat: Oropharynx is clear and moist. No oropharyngeal exudate.  Nasal mucosa is erythematous. TMs normal bilaterally.  Eyes:  Bilateral conjunctiva are injected.  Neck: Neck supple.  Cardiovascular: Normal rate, regular rhythm and normal heart sounds.   No murmur heard. Pulmonary/Chest: Effort normal and breath sounds normal. No respiratory distress. She has no wheezes. She has no rales.  Lymphadenopathy:    She has no cervical adenopathy.  Neurological: She is alert and oriented to person, place, and time.     UC Treatments / Results  Labs (all  labs ordered are listed, but only abnormal results are displayed) Labs Reviewed - No data to display  EKG  EKG Interpretation None       Radiology No results found.  Procedures Procedures (including critical care time)  Medications Ordered in UC Medications  dexamethasone (DECADRON) injection 10 mg (10 mg Intramuscular Given 04/17/16 1338)     Initial Impression / Assessment and Plan / UC Course  I have reviewed the triage vital signs and the nursing notes.  Pertinent labs & imaging results that were available during my care of the patient were reviewed by me and considered in my medical decision making (see chart for details).  Clinical Course     Symptomatic treatment for laryngitis with Decadron shot here. Continue home allergy medications. Polytrim for conjunctivitis. Expect to see improvement in the next 48 hours. Follow-up as needed.  Final Clinical Impressions(s) / UC Diagnoses   Final diagnoses:  Acute laryngopharyngitis    New Prescriptions Discharge Medication List as of 04/17/2016  1:29 PM    START taking these medications   Details  trimethoprim-polymyxin b (POLYTRIM) ophthalmic solution Place 1 drop into both eyes 4 (four) times daily. For 5 days, Starting Mon 04/17/2016, Normal         Melony Overly, MD 04/17/16 1351

## 2016-04-17 NOTE — ED Triage Notes (Signed)
Pt has been suffering from a cough, nasal congestion and drainage, bilateral ear pain, bilateral eye drainage, and hoarseness for two days.  Pt has not measured her temperature but reports chills and fatigue.    She also reports being exposed to conjunctivitis from her grandchildren.

## 2016-04-17 NOTE — Discharge Instructions (Signed)
You picked up a virus from your grandkids. Keep taking your allergy medications. Make sure you're drinking plenty of fluids. We gave you a shot to help with the inflammation and irritation today. Use the Polytrim eyedrops 4 times a day for 5 days. You should start to see improvement in the next 2 days. Follow-up as needed.

## 2016-04-18 ENCOUNTER — Ambulatory Visit: Payer: Medicare Other

## 2016-04-18 NOTE — Progress Notes (Unsigned)
Patient ID: [name]                 DOB: [birth year]                      MRN: [id number]     HPI: [name] is a 50 y.o. female of Dr. [name] retuning to HTN clinic for blood pressure follow-up and BMET. PMH is significant for multiple episodes of chest pain, NSTEMI in [place] in May 2013 with elevated troponin but normal coronaries and LVEF, and HTN.   At last pharmacy visit on 10/24, patient's blood pressure had improved from 160s/80s mmHg to 140s/100s mmHg. Losartan was increased from 50mg  to 100mg  daily and patient was to continue taking Toprol 75mg  BID, verapamil 120mg  TID, furosemide 20mg  daily, and Imdur 60mg  BID. She reported that she would work on exercise to improve her diastolic readings.   Pt was on venlafaxine which known to increase blood pressure. Advised her to discuss alternatives to venlafaxine with her PCP.  Add spirono?  Current HTN meds: Toprol 75mg  BID, verapamil 120mg  TID, furosemide 20mg  daily, Imdur 60mg  BID, losartan 100 mg daily Previously tried: Losartan 50 mg daily, amlodipine - swelling, olmesartan 20 mg daily, irbesartan 150 mg daily, telmisartan 40 mg daily BP goal: <140/35mmHg  Family History: Mother and maternal grandmother with CAD.   Social History: Patient denies smoking and illicit drug use. Drinks 1-3 glasses of wine per week.  Diet: Bakes chicken and vegetables, yogurt parfaits. Baked fish, beans, cabbage and corn. Uses salt free seasoning and Mrs Deliah Boston. Cooks with olive oil. Drinks almond milk. Goes to [employer].   Exercise: Walking and exercise tapes occasionally - wants to restart routine.   Home BP readings:   Wt Readings from Last 3 Encounters:  04/03/16 263 lb (119.3 kg)  03/28/16 272 lb 8 oz (123.6 kg)  03/06/16 263 lb 12.8 oz (119.7 kg)   BP Readings from Last 3 Encounters:  04/17/16 126/80  03/28/16 (!) 148/102  03/06/16 (!) 160/82   Pulse Readings from Last 3 Encounters:  04/17/16 94  03/28/16 82  03/06/16 98     Renal function: CrCl cannot be calculated (Patient's most recent lab result is older than the maximum 21 days allowed.).  Past Medical History:  Diagnosis Date  . Asthma   . Biliary dyskinesia    a. s/p cholecystectomy.  . 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 Abnl stress [place] 2008, f/u cath reportedly nl. b. ETT-Myoview 04/2011 - EKG changes but normal perfusion. Cor CT - no coronary calcium, no definite stenosis though mRCA not fully evaluated. c. 10/2011 - tn elevated in [place], LHC without CAD. Started on Ranexa, anti-anginals ?microvascular dz but later stopped while in hospital on abx.  . Complication of anesthesia    hard to wake up-had to be reminded to breath  . GERD (gastroesophageal reflux disease)    a. Severe.  Marland Kitchen HTN (hypertension)   . Hx of cardiovascular stress test    Lex Myoview 8/14:  Normal, EF 74%  . Hx of echocardiogram    Echo 3/16:  Mild LVH, EF 55-60%, Gr 1 DD, trivial MR, mild LAE, normal RVF  . Hypothyroid   . MI (myocardial infarction)   . Migraine   . MRSA infection    a. After vagal nerve stimulator at [facility] - surgical site MRSA infection, PICC placed.  . Obesity   . Palpitations  a. 08/2014: 48 hour holter with 2 PVCs otherwise normal.  . Seizure disorder ([place])    a. since childhood. b. s/p vagal nerve stimulator at [facility].  . Seizures [facility])     Current Outpatient Prescriptions on File Prior to Visit  Medication Sig Dispense Refill  . albuterol (PROAIR HFA) 108 (90 BASE) MCG/ACT inhaler Inhale 2 puffs into the lungs every 4 (four) hours as needed.    . Coenzyme Q10 200 MG TABS Take 1 tablet by mouth every evening.   0  . fluticasone (CUTIVATE) 0.05 % cream Apply topically 2 (two) times daily. 30 g 1  . fluticasone (FLONASE) 50 MCG/ACT nasal spray Place 1 spray into both nostrils 2 (two) times daily.    . furosemide (LASIX) 20 MG tablet Take 1 tablet (20 mg total) by mouth daily. 30 tablet 2  . isosorbide mononitrate  (IMDUR) 60 MG 24 hr tablet TAKE 1 TABLET (60 MG TOTAL) BY MOUTH 2 (TWO) TIMES DAILY. 60 tablet 6  . levothyroxine (SYNTHROID, LEVOTHROID) 50 MCG tablet Take 50 mcg by mouth daily.     Marland Kitchen losartan (COZAAR) 100 MG tablet Take 1 tablet (100 mg total) by mouth daily. 90 tablet 3  . metoprolol succinate (TOPROL-XL) 50 MG 24 hr tablet TAKE TABLET AND HALF  TWICE A DAY Take with or immediately following a meal. 270 tablet 1  . mometasone-formoterol (DULERA) 100-5 MCG/ACT AERO Inhale 2 puffs into the lungs 2 (two) times daily.    . montelukast (SINGULAIR) 10 MG tablet Take 10 mg by mouth at bedtime. 1 TAB QHS    . nitroGLYCERIN (NITRODUR - DOSED IN MG/24 HR) 0.2 mg/hr patch PLACE 1 PATCH ONTO THE SKIN DAILY. 30 patch 1  . nitroGLYCERIN (NITROSTAT) 0.4 MG SL tablet PLACE 1 TABLET (0.4 MG TOTAL) UNDER THE TONGUE EVERY 5 (FIVE) MINUTES AS NEEDED FOR CHEST PAIN. 25 tablet 3  . omeprazole (PRILOSEC) 40 MG capsule Take 1 capsule by mouth daily.    . promethazine (PHENERGAN) 25 MG tablet Take 25 mg by mouth as needed (for nausea). 1 TAB PO Q 4-6 HRS    . ranitidine (ZANTAC) 150 MG tablet Take 150 mg by mouth 2 (two) times daily.  11  . topiramate (TOPAMAX) 100 MG tablet Take 200 mg by mouth 2 (two) times daily.    . traZODone (DESYREL) 50 MG tablet Take 50 mg by mouth at bedtime.    Marland Kitchen trimethoprim-polymyxin b (POLYTRIM) ophthalmic solution Place 1 drop into both eyes 4 (four) times daily. For 5 days 10 mL 0  . venlafaxine (EFFEXOR) 75 MG tablet Take 75 mg by mouth 2 (two) times daily.    . verapamil (CALAN) 120 MG tablet Take 120 mg by mouth 3 (three) times daily.     No current facility-administered medications on file prior to visit.     Allergies  Allergen Reactions  . Amantadines Swelling  . Amitriptyline Swelling  . Amlodipine Swelling  . Aspirin Nausea Only    Irritates stomach also/hx of stomach ulcers  . Latex Hives, Itching and Swelling    Burning also  . Simvastatin Swelling    Muscle pains  also  . Tramadol Hives    Assessment/Plan:  1. Hypertension -

## 2016-04-19 ENCOUNTER — Encounter (INDEPENDENT_AMBULATORY_CARE_PROVIDER_SITE_OTHER): Payer: Self-pay | Admitting: Orthopaedic Surgery

## 2016-04-19 ENCOUNTER — Ambulatory Visit (INDEPENDENT_AMBULATORY_CARE_PROVIDER_SITE_OTHER): Payer: Medicare Other | Admitting: Orthopaedic Surgery

## 2016-04-19 VITALS — BP 118/70 | HR 87 | Ht 65.5 in | Wt 265.0 lb

## 2016-04-19 DIAGNOSIS — M545 Low back pain, unspecified: Secondary | ICD-10-CM

## 2016-04-19 DIAGNOSIS — M25571 Pain in right ankle and joints of right foot: Secondary | ICD-10-CM | POA: Diagnosis not present

## 2016-04-19 HISTORY — DX: Low back pain: M54.5

## 2016-04-19 HISTORY — DX: Low back pain, unspecified: M54.50

## 2016-04-19 NOTE — Progress Notes (Deleted)
Wound Care Note   Patient: [name]           Date of Birth: 28-Feb-1966           MRN: [id number]             PCP: Mackie Pai, MD Visit Date: 04/19/2016   Assessment & Plan: Visit Diagnoses: No diagnosis found.  Plan: ***   Follow-Up Instructions: No Follow-up on file.  Orders:  No orders of the defined types were placed in this encounter.  No orders of the defined types were placed in this encounter.     Procedures: No notes on file   Clinical Data: No additional findings.   No images are attached to the encounter.   Subjective: Chief Complaint  Patient presents with  . Right Ankle - Follow-up  . Lower Back - Pain    HPI  Review of Systems  Miscellaneous:  -Home Health Care: ***  -Physical Therapy: ***  -Out of Work?: ***  -Worker's Compensation Case?: ***  -Additional Information: ***   Objective: Vital Signs: BP 118/70 (BP Location: Left Arm, Patient Position: Sitting)   Pulse 87   Ht 5' 5.5" (1.664 m)   Wt 265 lb (120.2 kg)   LMP 04/17/2016 (Exact Date)   BMI 43.43 kg/m   Physical Exam: ***  Specialty Comments: No specialty comments available.   PMFS History: Patient Active Problem List   Diagnosis Date Noted  . Asthma 08/24/2014  . Chronic diastolic heart failure 99991111  . Chest pain 07/02/2013  . Hypothyroidism 07/02/2013  . Hypothyroid   . Chronic chest pain   . GERD (gastroesophageal reflux disease) 02/10/2013  . Biliary dyskinesia 12/30/2012  . Exertional dyspnea 09/19/2012  . Hyperlipidemia 05/17/2011  . Non-cardiac chest pain secondary to GERD 04/24/2011  . Hypertension 04/24/2011   Past Medical History:  Diagnosis Date  . Asthma   . Biliary dyskinesia    a. s/p cholecystectomy.  . 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 Abnl stress [place] 2008, f/u cath reportedly nl. b. ETT-Myoview 04/2011 - EKG changes but normal perfusion. Cor CT - no coronary calcium, no definite stenosis  though mRCA not fully evaluated. c. 10/2011 - tn elevated in [place], LHC without CAD. Started on Ranexa, anti-anginals ?microvascular dz but later stopped while in hospital on abx.  . Complication of anesthesia    hard to wake up-had to be reminded to breath  . GERD (gastroesophageal reflux disease)    a. Severe.  Marland Kitchen HTN (hypertension)   . Hx of cardiovascular stress test    Lex Myoview 8/14:  Normal, EF 74%  . Hx of echocardiogram    Echo 3/16:  Mild LVH, EF 55-60%, Gr 1 DD, trivial MR, mild LAE, normal RVF  . Hypothyroid   . MI (myocardial infarction)   . Migraine   . MRSA infection    a. After vagal nerve stimulator at [facility] - surgical site MRSA infection, PICC placed.  . Obesity   . Palpitations    a. 08/2014: 48 hour holter with 2 PVCs otherwise normal.  . Seizure disorder ([place])    a. since childhood. b. s/p vagal nerve stimulator at [facility].  . Seizures ([place])     Family History  Problem Relation Age of Onset  . Coronary artery disease Mother 9  . Coronary artery disease Maternal Grandmother   . Cancer Other   . Hypertension Other   . Stroke Other  Past Surgical History:  Procedure Laterality Date  . [place]     placement of vagal nerve stimulator.  . CHOLECYSTECTOMY N/A 01/24/2013   Procedure: LAPAROSCOPIC CHOLECYSTECTOMY;  Surgeon: Harl Bowie, MD;  Location: [place];  Service: General;  Laterality: N/A;  . IMPLANTATION VAGAL NERVE STIMULATOR  [phone]   [facility]   Social History   Occupational History  . Not on file.   Social History Main Topics  . Smoking status: Never Smoker  . Smokeless tobacco: Never Used  . Alcohol use 0.0 oz/week    1 - 3 Glasses of wine per week     Comment: occasional  . Drug use: No  . Sexual activity: Yes    Birth control/ protection: None

## 2016-04-19 NOTE — Progress Notes (Signed)
Office Visit Note   Patient: [name]           Date of Birth: February 13, 1966           MRN: [id number] Visit Date: 04/19/2016              Requested by: Mackie Pai, MD [place], [place] [address] PCP: Mackie Pai, MD   Assessment & Plan: Visit Diagnoses:  1. Midline low back pain without sciatica, unspecified chronicity   2. Pain in right ankle and joints of right foot     Plan: We'll set up some physical therapy for evaluation treatment strengthening of her back for the back pain that she is having which has been fairly chronic. [place] and problems with the right ankle and physical therapy to work on ankle ankle strengthening exercises. She does not have any instability of the ankle post ankle sprain back in January. VAULT 1 month for a check of her therapy progress.  Follow-Up Instructions: Return in about 1 month (around 05/19/2016).   Orders:  No orders of the defined types were placed in this encounter.  No orders of the defined types were placed in this encounter.     Procedures: No procedures performed   Clinical Data: No additional findings.   Subjective: Chief Complaint  Patient presents with  . Right Ankle - Follow-up  . Lower Back - Pain    [name] is for her right ankle pain, if she sits to long and try's to stand up it is painful to stand on, there is swelling if she does to much on it. Pain with ambulating on it.  Wears her brace at times. Pain is stabbing at times.  Also she is having back pain, states it feel like muscle spasms, stabbing pain on right side, numbness across the lower back, keeps her from doing her daily activities, pain is severe, has had previous back injury and it flares up fro time to time, but this time it has not gone away. She has tried the over-the-counter medications with no relief. She has tried to naperxon, heat, rubs.      Review of Systems  Constitutional: Negative for chills and diaphoresis.  HENT:  Negative for ear discharge, ear pain and nosebleeds.   Eyes: Negative for discharge and visual disturbance.  Respiratory: Negative for cough, choking and shortness of breath.   Cardiovascular: Negative for chest pain and palpitations.  Gastrointestinal: Negative for abdominal distention and abdominal pain.  Endocrine: Negative for cold intolerance and heat intolerance.  Genitourinary: Negative for flank pain and hematuria.  Skin: Negative for rash and wound.  Neurological: Negative for seizures and speech difficulty.  Hematological: Negative for adenopathy. Does not bruise/bleed easily.  Psychiatric/Behavioral: Negative for agitation and suicidal ideas.  Previous MI 2014 history of heart failure   Objective: Vital Signs: BP 118/70 (BP Location: Left Arm, Patient Position: Sitting)   Pulse 87   Ht 5' 5.5" (1.664 m)   Wt 265 lb (120.2 kg)   LMP 04/17/2016 (Exact Date)   BMI 43.43 kg/m   Physical Exam  Constitutional: She is oriented to person, place, and time. She appears well-developed.  HENT:  Head: Normocephalic.  Right Ear: External ear normal.  Left Ear: External ear normal.  Eyes: Pupils are equal, round, and reactive to light.  Neck: No tracheal deviation present. No thyromegaly present.  Cardiovascular: Normal rate.   Pulmonary/Chest: Effort normal.  Abdominal: Soft.  Neurological: She is alert and oriented  to person, place, and time.  Skin: Skin is warm and dry.  Psychiatric: She has a normal mood and affect. Her behavior is normal.  Patient has pain getting from sitting standing she walks with a short stride gait. She complains of pain with weightbearing on the right ankle. No effusion is noted over ankle she is tender along the medial anterior and lateral aspect. Negative anterior drawer with good endpoint. Subtalar motion is normal. Ortho Exam negative log roll to the hips knee and ankle jerk are symmetrical. No pitting edema pulses are 2+ dorsalis pedis posterior  tib.  Specialty Comments:  No specialty comments available.  Imaging: No results found.   PMFS History: Patient Active Problem List   Diagnosis Date Noted  . Midline low back pain without sciatica 04/19/2016  . Asthma 08/24/2014  . Chronic diastolic heart failure 99991111  . Chest pain 07/02/2013  . Hypothyroidism 07/02/2013  . Hypothyroid   . Chronic chest pain   . GERD (gastroesophageal reflux disease) 02/10/2013  . Biliary dyskinesia 12/30/2012  . Exertional dyspnea 09/19/2012  . Hyperlipidemia 05/17/2011  . Non-cardiac chest pain secondary to GERD 04/24/2011  . Hypertension 04/24/2011   Past Medical History:  Diagnosis Date  . Asthma   . Biliary dyskinesia    a. s/p cholecystectomy.  . 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 Abnl stress [place] 2008, f/u cath reportedly nl. b. ETT-Myoview 04/2011 - EKG changes but normal perfusion. Cor CT - no coronary calcium, no definite stenosis though mRCA not fully evaluated. c. 10/2011 - [place]n elevated in [place], LHC without CAD. Started on Ranexa, anti-anginals ?microvascular dz but later stopped while in hospital on abx.  . Complication of anesthesia    hard to wake up-had to be reminded to breath  . GERD (gastroesophageal reflux disease)    a. Severe.  Marland Kitchen HTN (hypertension)   . Hx of cardiovascular stress test    Lex Myoview 8/14:  Normal, EF 74%  . Hx of echocardiogram    Echo 3/16:  Mild LVH, EF 55-60%, Gr 1 DD, trivial MR, mild LAE, normal RVF  . Hypothyroid   . MI (myocardial infarction)   . Migraine   . MRSA infection    a. After vagal nerve stimulator at [facility] - surgical site MRSA infection, PICC placed.  . Obesity   . Palpitations    a. 08/2014: 48 hour holter with 2 PVCs otherwise normal.  . Seizure disorder ([place])    a. since childhood. b. s/p vagal nerve stimulator at [facility].  . Seizures ([place])     Family History  Problem Relation Age of Onset  . Coronary artery disease Mother 59  .  Coronary artery disease Maternal Grandmother   . Cancer Other   . Hypertension Other   . Stroke Other     Past Surgical History:  Procedure Laterality Date  . [place]     placement of vagal nerve stimulator.  . CHOLECYSTECTOMY N/A 01/24/2013   Procedure: LAPAROSCOPIC CHOLECYSTECTOMY;  Surgeon: Harl Bowie, MD;  Location: [place];  Service: General;  Laterality: N/A;  . IMPLANTATION VAGAL NERVE STIMULATOR  [phone]   [facility]   Social History   Occupational History  . Not on file.   Social History Main Topics  . Smoking status: Never Smoker  . Smokeless tobacco: Never Used  . Alcohol use 0.0 oz/week  1 - 3 Glasses of wine per week     Comment: occasional  . Drug use: No  . Sexual activity: Yes    Birth control/ protection: None

## 2016-04-21 DIAGNOSIS — I252 Old myocardial infarction: Secondary | ICD-10-CM | POA: Diagnosis not present

## 2016-04-21 DIAGNOSIS — I110 Hypertensive heart disease with heart failure: Secondary | ICD-10-CM | POA: Diagnosis not present

## 2016-04-21 DIAGNOSIS — K219 Gastro-esophageal reflux disease without esophagitis: Secondary | ICD-10-CM | POA: Diagnosis not present

## 2016-04-21 DIAGNOSIS — Z79899 Other long term (current) drug therapy: Secondary | ICD-10-CM | POA: Diagnosis not present

## 2016-04-21 DIAGNOSIS — I5033 Acute on chronic diastolic (congestive) heart failure: Secondary | ICD-10-CM | POA: Diagnosis not present

## 2016-04-21 DIAGNOSIS — E785 Hyperlipidemia, unspecified: Secondary | ICD-10-CM | POA: Diagnosis not present

## 2016-04-21 DIAGNOSIS — Z23 Encounter for immunization: Secondary | ICD-10-CM | POA: Diagnosis not present

## 2016-04-21 DIAGNOSIS — E039 Hypothyroidism, unspecified: Secondary | ICD-10-CM | POA: Diagnosis not present

## 2016-04-21 DIAGNOSIS — J309 Allergic rhinitis, unspecified: Secondary | ICD-10-CM | POA: Diagnosis not present

## 2016-04-21 DIAGNOSIS — J4550 Severe persistent asthma, uncomplicated: Secondary | ICD-10-CM | POA: Diagnosis not present

## 2016-04-21 DIAGNOSIS — Z7951 Long term (current) use of inhaled steroids: Secondary | ICD-10-CM | POA: Diagnosis not present

## 2016-05-10 ENCOUNTER — Ambulatory Visit (INDEPENDENT_AMBULATORY_CARE_PROVIDER_SITE_OTHER): Payer: Medicare Other | Admitting: Pharmacist

## 2016-05-10 DIAGNOSIS — I10 Essential (primary) hypertension: Secondary | ICD-10-CM

## 2016-05-10 MED ORDER — METOPROLOL SUCCINATE ER 100 MG PO TB24: 100.0000 mg | ORAL_TABLET | Freq: Every day | ORAL | 11 refills | Status: DC

## 2016-05-10 NOTE — Patient Instructions (Addendum)
Increase Toprol to 100 mg daily.  May use 2 tablets of 50 mg tablets until all gone.  Follow up in 3 weeks with Hypertension clinic (december/28/2017)

## 2016-05-10 NOTE — Progress Notes (Signed)
Patient ID: [name]                 DOB: March 10, 1966                      MRN: [id number]     HPI: [name] is a 50 y.o. female patient of Dr Shirlee Latch referred to HTN clinic by Robbie Lis. PMH is significant for multiple episodes of chest pain, NSTEMI in [place] in May 2013 with elevated troponin but normal coronaries and LVEF, and HTN. Reporst good complience with current therapy, and denies dizziness, headaches, or palpitations.  Patient reports that her home BP readings are running high again with SBP ~180. Patient also using her nitroglycerin patch with a decrease on chest pain frequency.   Current HTN meds: losartan 100mg  daily, Toprol 75mg  BID, verapamil 120mg  TID, furosemide 20mg  daily, Imdur 60mg  BID  Previously tried: amlodipine - swelling  BP goal: <130/58mmHg  Family History: Mother and maternal grandmother with CAD.  Social History: Patient denies smoking and illicit drug use. Drinks 1-3 glasses of wine per week.  Diet: Bakes chicken and vegetables, yogurt parfaits. Baked fish, beans, cabbage and corn. Uses salt free seasoning and Mrs Sharilyn Sites. Cooks with olive oil. Drinks almond milk. Goes to [employer].  Exercise: Walking occasionally.   Wt Readings from Last 3 Encounters:  04/19/16 265 lb (120.2 kg)  04/03/16 263 lb (119.3 kg)  03/28/16 272 lb 8 oz (123.6 kg)   BP Readings from Last 3 Encounters:  04/19/16 118/70  04/17/16 126/80  03/28/16 (!) 148/102   Pulse Readings from Last 3 Encounters:  04/19/16 87  04/17/16 94  03/28/16 82     Past Medical History:  Diagnosis Date  . Asthma   . Biliary dyskinesia    a. s/p cholecystectomy.  . 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 Abnl stress [place] 2008, f/u cath reportedly nl. b. ETT-Myoview 04/2011 - EKG changes but normal perfusion. Cor CT - no coronary calcium, no definite stenosis though mRCA not fully evaluated. c. 10/2011 - [place] in [place], LHC without CAD. Started  on Ranexa, anti-anginals ?microvascular dz but later stopped while in hospital on abx.  . Complication of anesthesia    hard to wake up-had to be reminded to breath  . GERD (gastroesophageal reflux disease)    a. Severe.  Marland Kitchen HTN (hypertension)   . Hx of cardiovascular stress test    Lex Myoview 8/14:  Normal, EF 74%  . Hx of echocardiogram    Echo 3/16:  Mild LVH, EF 55-60%, Gr 1 DD, trivial MR, mild LAE, normal RVF  . Hypothyroid   . MI (myocardial infarction)   . Migraine   . MRSA infection    a. After vagal nerve stimulator at [facility] - surgical site MRSA infection, PICC placed.  . Obesity   . Palpitations    a. 08/2014: 48 hour holter with 2 PVCs otherwise normal.  . Seizure disorder ([facility])    a. since childhood. b. s/p vagal nerve stimulator at [facility].  . Seizures [facility])     Current Outpatient Prescriptions on File Prior to Visit  Medication Sig Dispense Refill  . albuterol (PROAIR HFA) 108 (90 BASE) MCG/ACT inhaler Inhale 2 puffs into the lungs every 4 (four) hours as needed.    . Coenzyme Q10 200 MG TABS Take 1 tablet by mouth every evening.   0  . fluticasone (CUTIVATE) 0.05 %  cream Apply topically 2 (two) times daily. 30 g 1  . fluticasone (FLONASE) 50 MCG/ACT nasal spray Place 1 spray into both nostrils 2 (two) times daily.    . furosemide (LASIX) 20 MG tablet Take 1 tablet (20 mg total) by mouth daily. 30 tablet 2  . isosorbide mononitrate (IMDUR) 60 MG 24 hr tablet TAKE 1 TABLET (60 MG TOTAL) BY MOUTH 2 (TWO) TIMES DAILY. 60 tablet 6  . levothyroxine (SYNTHROID, LEVOTHROID) 50 MCG tablet Take 50 mcg by mouth daily.     Marland Kitchen losartan (COZAAR) 100 MG tablet Take 1 tablet (100 mg total) by mouth daily. 90 tablet 3  . losartan (COZAAR) 50 MG tablet TAKE 1 TABLET (50 MG TOTAL) BY MOUTH DAILY.  1  . metoprolol succinate (TOPROL-XL) 50 MG 24 hr tablet TAKE TABLET AND HALF  TWICE A DAY Take with or immediately following a meal. 270 tablet 1  . mometasone-formoterol (DULERA) 100-5  MCG/ACT AERO Inhale 2 puffs into the lungs 2 (two) times daily.    . montelukast (SINGULAIR) 10 MG tablet Take 10 mg by mouth at bedtime. 1 TAB QHS    . naproxen (NAPROSYN) 500 MG tablet TAKE 1 TABLET TWICE A DAY FOR 3 DAYS BEFORE PERIOD,STOP WHEN PERIOD STARTS  2  . nitroGLYCERIN (NITRODUR - DOSED IN MG/24 HR) 0.2 mg/hr patch PLACE 1 PATCH ONTO THE SKIN DAILY. 30 patch 1  . nitroGLYCERIN (NITROSTAT) 0.4 MG SL tablet PLACE 1 TABLET (0.4 MG TOTAL) UNDER THE TONGUE EVERY 5 (FIVE) MINUTES AS NEEDED FOR CHEST PAIN. 25 tablet 3  . omeprazole (PRILOSEC) 40 MG capsule Take 1 capsule by mouth daily.    . promethazine (PHENERGAN) 25 MG tablet Take 25 mg by mouth as needed (for nausea). 1 TAB PO Q 4-6 HRS    . ranitidine (ZANTAC) 150 MG tablet Take 150 mg by mouth 2 (two) times daily.  11  . topiramate (TOPAMAX) 100 MG tablet Take 200 mg by mouth 2 (two) times daily.    . traZODone (DESYREL) 50 MG tablet Take 50 mg by mouth at bedtime.    Marland Kitchen trimethoprim-polymyxin b (POLYTRIM) ophthalmic solution Place 1 drop into both eyes 4 (four) times daily. For 5 days 10 mL 0  . venlafaxine (EFFEXOR) 75 MG tablet Take 75 mg by mouth 2 (two) times daily.    . verapamil (CALAN) 120 MG tablet Take 120 mg by mouth 3 (three) times daily.    . Vitamin D, Ergocalciferol, (DRISDOL) 50000 units CAPS capsule TAKE ONE CAPSULE BY MOUTH EACH WEEK FOR 12 WEEKS  0   No current facility-administered medications on file prior to visit.     Allergies  Allergen Reactions  . Amantadines Swelling  . Amitriptyline Swelling  . Amlodipine Swelling  . Aspirin Nausea Only    Irritates stomach also/hx of stomach ulcers  . Latex Hives, Itching and Swelling    Burning also  . Simvastatin Swelling    Muscle pains also  . Tramadol Hives     Assessment/Plan:  1. Hypertension - BP 162/90 today is above desired goal of <130/80 and significantly increased from previous office visit ,but in accordance with home readings reported by  patient. Will increase Toprol to 100 mg daily and continue all other medication. Follow up in HTN clinic in 3 weeks for BP check and BMET.  Kassidee Narciso Rodriguez-Guzman PharmD, BCPS [facility] [address] [place] [address] 05/10/2016 12:07 PM

## 2016-05-12 ENCOUNTER — Ambulatory Visit: Payer: Medicare Other | Admitting: Physical Therapy

## 2016-05-12 ENCOUNTER — Ambulatory Visit: Payer: Medicare Other | Attending: Family Medicine | Admitting: Physical Therapy

## 2016-05-12 ENCOUNTER — Encounter: Payer: Self-pay | Admitting: Physical Therapy

## 2016-05-12 DIAGNOSIS — M62830 Muscle spasm of back: Secondary | ICD-10-CM

## 2016-05-12 DIAGNOSIS — G8929 Other chronic pain: Secondary | ICD-10-CM | POA: Insufficient documentation

## 2016-05-12 DIAGNOSIS — M545 Low back pain, unspecified: Secondary | ICD-10-CM

## 2016-05-12 DIAGNOSIS — R262 Difficulty in walking, not elsewhere classified: Secondary | ICD-10-CM | POA: Insufficient documentation

## 2016-05-15 ENCOUNTER — Ambulatory Visit: Payer: Medicare Other

## 2016-05-15 DIAGNOSIS — M25551 Pain in right hip: Secondary | ICD-10-CM | POA: Diagnosis not present

## 2016-05-15 DIAGNOSIS — I10 Essential (primary) hypertension: Secondary | ICD-10-CM | POA: Diagnosis not present

## 2016-05-15 DIAGNOSIS — L732 Hidradenitis suppurativa: Secondary | ICD-10-CM | POA: Diagnosis not present

## 2016-05-15 DIAGNOSIS — J4520 Mild intermittent asthma, uncomplicated: Secondary | ICD-10-CM | POA: Diagnosis not present

## 2016-05-15 DIAGNOSIS — E039 Hypothyroidism, unspecified: Secondary | ICD-10-CM | POA: Diagnosis not present

## 2016-05-15 DIAGNOSIS — Z713 Dietary counseling and surveillance: Secondary | ICD-10-CM | POA: Diagnosis not present

## 2016-05-15 DIAGNOSIS — G40309 Generalized idiopathic epilepsy and epileptic syndromes, not intractable, without status epilepticus: Secondary | ICD-10-CM | POA: Diagnosis not present

## 2016-05-15 DIAGNOSIS — M545 Low back pain: Secondary | ICD-10-CM | POA: Diagnosis not present

## 2016-05-15 DIAGNOSIS — I2510 Atherosclerotic heart disease of native coronary artery without angina pectoris: Secondary | ICD-10-CM | POA: Diagnosis not present

## 2016-05-15 NOTE — Therapy (Signed)
[place], [place], [address] Phone: [phone]   Fax:  [phone]  Physical Therapy Evaluation  Patient Details  Name: [name] MRN: [id number] Date of Birth: [birth year] Referring Provider: Dr Rodell Perna   Encounter Date: 05/12/2016      PT End of Session - 05/15/16 1001    Visit Number 2   Number of Visits 16   Date for PT Re-Evaluation 07/07/16   Authorization Type Medicare   PT Start Time 0952   PT Stop Time 1045   PT Time Calculation (min) 53 min   Activity Tolerance Patient tolerated treatment well   Behavior During Therapy [facility] for tasks assessed/performed      Past Medical History:  Diagnosis Date  . Asthma   . Biliary dyskinesia    a. s/p cholecystectomy.  . 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 Abnl stress [place] 2008, f/u cath reportedly nl. b. ETT-Myoview 04/2011 - EKG changes but normal perfusion. Cor CT - no coronary calcium, no definite stenosis though mRCA not fully evaluated. c. 10/2011 - tn elevated in [place], LHC without CAD. Started on Ranexa, anti-anginals ?microvascular dz but later stopped while in hospital on abx.  . Complication of anesthesia    hard to wake up-had to be reminded to breath  . GERD (gastroesophageal reflux disease)    a. Severe.  Mar[place] Kitchen HTN (hypertension)   . Hx of cardiovascular stress test    [facility] Myoview 8/14:  Normal, EF 74%  . Hx of echocardiogram    Echo 3/16:  Mild LVH, EF 55-60%, Gr 1 DD, trivial MR, mild LAE, normal RVF  . Hypothyroid   . MI (myocardial infarction)   . Migraine   . MRSA infection    a. After vagal nerve stimulator at [facility] - surgical site MRSA infection, PICC placed.  . Obesity   . Palpitations    a. 08/2014: 48 hour holter with 2 PVCs otherwise normal.  . Seizure disorder ([place])    a. since childhood. b. s/p vagal nerve stimulator at [facility].  . Seizures ([place])     Past Surgical History:  Procedure  Laterality Date  . [place]     placement of vagal nerve stimulator.  . CHOLECYSTECTOMY N/A 01/24/2013   Procedure: LAPAROSCOPIC CHOLECYSTECTOMY;  Surgeon: Harl Bowie, MD;  Location: [place];  Service: General;  Laterality: N/A;  . IMPLANTATION VAGAL NERVE STIMULATOR  [phone]   [facility]    There were no vitals filed for this visit.       Subjective Assessment - 05/15/16 1020    Subjective Patient reports a histroy of right lower back pain. In January of 2017 she fell and sprained her right ankle. Her back pain has increased. She reports her pain is worse when she is up and walking and in the morning.    Limitations Standing;Walking   How long can you sit comfortably? > 1 hour    How long can you stand comfortably? < 10 min    How long can you walk comfortably? < 300' without increased pain    Diagnostic tests x-ray: no ankle fx    Patient Stated Goals to have less pain when perfroming activity    Currently in Pain? Yes   Pain Score 6    Pain Location Back   Pain Orientation Right;Left  Pain Descriptors / Indicators Aching   Pain Type Chronic pain   Pain Onset Today   Pain Frequency Occasional   Aggravating Factors  movement    Pain Relieving Factors rest    Effect of Pain on Daily Activities difficulty perfroming ADL's             [facility] PT Assessment - 05/15/16 0001      Assessment   Medical Diagnosis Midline low back pain/ Right ankle pain    Referring Provider Dr Rodell Perna    Onset Date/Surgical Date --  Ankle Jan 2017; back 2005    Hand Dominance Right   Next MD Visit 06/18/2015   Prior Therapy Has had physical therapy for her back in 2004/ 2005     Precautions   Precautions None     Restrictions   Weight Bearing Restrictions No     Balance Screen   Has the patient fallen in the past 6 months No     [place]  residence   Additional Comments No steps at home      Prior Function   Level of Independence Independent   Vocation On disability   Leisure walking/ exercising      Cognition   Overall Cognitive Status Within Functional Limits for tasks assessed   Attention Focused   Focused Attention Appears intact   Memory Appears intact   Awareness Appears intact   Problem Solving Appears intact     Observation/Other Assessments   Observations Obesity      Coordination   Gross Motor Movements are Fluid and Coordinated Yes   Fine Motor Movements are Fluid and Coordinated Yes     AROM   Overall AROM Comments Limited active hip flexion    Lumbar Flexion 50% with pain    Lumbar Extension 50% with pain    Lumbar - Right Side Bend 25% with pain    Lumbar - Left Side Bend 25% with pain    Lumbar - Right Rotation 25% with pain    Lumbar - Left Rotation 25% with pain      PROM   Overall PROM Comments Pain with passive hip flexion; Hip flexion on hte left to 75 degrees before pain begins    Right Ankle Dorsiflexion -6   Right Ankle Plantar Flexion 35   Right Ankle Inversion 16   Right Ankle Eversion 10     Strength   Overall Strength Comments Left ankle WNL    Right Hip Flexion 4-/5   Right Hip Extension 3/5  unable to get on her stomach    Right Hip ABduction 4/5   Right Hip ADduction 4/5   Left Hip Flexion 4/5   Left Hip ABduction 4/5   Left Hip ADduction 4/5   Right Knee Flexion 4/5   Right Knee Extension 4/5   Left Knee Flexion 5/5   Left Knee Extension 5/5   Right Ankle Dorsiflexion 3/5   Right Ankle Plantar Flexion 1/5   Right Ankle Inversion 3/5     Palpation   Palpation comment Tenderness to palpation in the upper right ankle and into the lateral maleolus;                    [facility] Adult PT Treatment/Exercise - 05/15/16 0001      Ambulation/Gait   Gait Comments decreased single leg stance on the right; increased lateral movement from side to side;  Decreased  ankle dorsi flexion during  swing phase.      Posture/Postural Control   Posture Comments Forward head;      Exercises   Exercises Lumbar;Ankle     Lumbar Exercises: Stretches   Single Knee to Chest Stretch Limitations 2x20sec with towel on the right    Lower Trunk Rotation Limitations x10 in low range      Lumbar Exercises: Supine   Other Supine Lumbar Exercises Abdominal breathing x10      Modalities   Modalities Electrical Stimulation     Electrical Stimulation   Electrical Stimulation Location lower back    Electrical Stimulation Goals Pain     Ankle Exercises: Supine   Other Supine Ankle Exercises Ankle 4 way movement x10 each; DF stretch with strap 3x15 sec hold                 PT Education - 06-11-2016 1000    Education provided Yes   Education Details Symptom mangement, impotance of HEP    Person(s) Educated Patient   Methods Explanation;Demonstration   Comprehension Verbalized understanding;Returned demonstration          PT Short Term Goals - 05/12/16 1135      PT SHORT TERM GOAL #1   Title Patient will increase lumbar flexion and extension by 25%    Time 4   Period Weeks   Status New     PT SHORT TERM GOAL #2   Title Patient will increase gross right ankle strength to 4/5 for ER/IR and DF    Time 4   Period Weeks   Status New     PT SHORT TERM GOAL #3   Title Patient will demsotrate a good core contraction    Time 4   Period Weeks   Status New     PT SHORT TERM GOAL #4   Title Patient will be independent with initial HEP    Time 4   Period Weeks   Status New           PT Long Term Goals - 06/11/2016 1009      PT LONG TERM GOAL #1   Title Patient will stand for 1 hour without report of ankle and back pain    Time 4   Period Weeks   Status New     PT LONG TERM GOAL #2   Title Patient will go up/down 8 steps without increased pain    Time 4   Period Weeks   Status New     PT LONG TERM GOAL #3   Title Patient will ambualte  3000' without increased pain in order to go shopping   Time 4   Status New     PT LONG TERM GOAL #4   Title Patient will return to gym with a program to improve core and lower extremity strength    Time 4   Period Weeks   Status New             Patient will benefit from skilled therapeutic intervention in order to improve the following deficits and impairments:  Abnormal gait, Decreased strength, Decreased mobility, Pain, Increased muscle spasms, Decreased endurance, Decreased safety awareness, Decreased activity tolerance  Visit Diagnosis: Chronic bilateral low back pain without sciatica  Muscle spasm of back  Difficulty in walking, not elsewhere classified      G-Codes - June 11, 2016 1020    Functional Limitation Mobility: Walking and moving around   Mobility: Walking and Moving Around Current Status [id number]) At least 60  percent but less than 80 percent impaired, limited or restricted   Mobility: Walking and Moving Around Goal Status [phone]) At least 20 percent but less than 40 percent impaired, limited or restricted       Problem List Patient Active Problem List   Diagnosis Date Noted  . Midline low back pain without sciatica 04/19/2016  . Asthma 08/24/2014  . Chronic diastolic heart failure [id number]  . Chest pain 07/02/2013  . Hypothyroidism 07/02/2013  . Hypothyroid   . Chronic chest pain   . GERD (gastroesophageal reflux disease) 02/10/2013  . Biliary dyskinesia 12/30/2012  . Exertional dyspnea 09/19/2012  . Hyperlipidemia 05/17/2011  . Non-cardiac chest pain secondary to GERD 04/24/2011  . Hypertension, essential 04/24/2011    Carney Living 05/15/2016, 10:21 AM  [facility] [address] [place], [place], [address] Phone: [phone]   Fax:  [phone]  Name: [name] MRN: [id number] Date of Birth: 1965-08-21

## 2016-05-17 ENCOUNTER — Telehealth (INDEPENDENT_AMBULATORY_CARE_PROVIDER_SITE_OTHER): Payer: Self-pay | Admitting: Orthopaedic Surgery

## 2016-05-17 ENCOUNTER — Encounter: Payer: Medicare Other | Admitting: Physical Therapy

## 2016-05-17 ENCOUNTER — Ambulatory Visit (INDEPENDENT_AMBULATORY_CARE_PROVIDER_SITE_OTHER): Payer: Self-pay

## 2016-05-17 ENCOUNTER — Encounter (INDEPENDENT_AMBULATORY_CARE_PROVIDER_SITE_OTHER): Payer: Self-pay | Admitting: Orthopaedic Surgery

## 2016-05-17 ENCOUNTER — Ambulatory Visit (INDEPENDENT_AMBULATORY_CARE_PROVIDER_SITE_OTHER): Payer: Medicare Other | Admitting: Orthopaedic Surgery

## 2016-05-17 VITALS — BP 152/93 | HR 79

## 2016-05-17 DIAGNOSIS — M25561 Pain in right knee: Secondary | ICD-10-CM | POA: Diagnosis not present

## 2016-05-17 DIAGNOSIS — M545 Low back pain, unspecified: Secondary | ICD-10-CM

## 2016-05-17 DIAGNOSIS — M25571 Pain in right ankle and joints of right foot: Secondary | ICD-10-CM | POA: Diagnosis not present

## 2016-05-17 DIAGNOSIS — G8929 Other chronic pain: Secondary | ICD-10-CM | POA: Diagnosis not present

## 2016-05-17 MED ORDER — LIDOCAINE HCL 1 % IJ SOLN
1.0000 mL | INTRAMUSCULAR | Status: AC | PRN
  Administered 2016-05-17: 1 mL

## 2016-05-17 MED ORDER — METHYLPREDNISOLONE ACETATE 40 MG/ML IJ SUSP
40.0000 mg | INTRAMUSCULAR | Status: AC | PRN
  Administered 2016-05-17: 40 mg via INTRA_ARTICULAR

## 2016-05-17 NOTE — Progress Notes (Signed)
Office Visit Note   Patient: [name]           Date of Birth: 1965-12-12           MRN: [id number] Visit Date: 05/17/2016              Requested by: Mackie Pai, MD [place], [place] [address] PCP: Mackie Pai, MD   Assessment & Plan: Visit Diagnoses:  1. Chronic pain of right knee   2. Chronic midline low back pain without sciatica   3. Pain in lateral portion of right ankle   question possible right ankle meniscoid lesion   Plan: For ongoing back issues we'll schedule CT lumbar spine. Follow-up in the office after completion to discuss results and treatment options. Give some relief of her ankle pain offered injection. After sitting for a few minutes patient reported excellent relief of her ankle pain with Marcaine and place. We'll see how this is doing when she returns for follow-up. States that ankle never got better after her sprain earlier this year. She may have a meniscoid lesion that is causing her problems.  Follow-Up Instructions: Return after ct scan to discuss results.   Orders:  Orders Placed This Encounter  Procedures  . Foot Injection  . Small Joint Injection/Arthrocentesis  . Medium Joint Injection/Arthrocentesis  . XR KNEE 3 VIEW RIGHT  . XR Lumbar Spine 2-3 Views  . CT LUMBAR SPINE WO CONTRAST   No orders of the defined types were placed in this encounter.     Procedures: Medium Joint Inj Date/Time: 05/17/2016 11:43 AM Performed by: Lanae Crumbly Authorized by: Lanae Crumbly   Consent Given by:  Patient Indications:  Pain Location:  Ankle Site:  R ankle Needle Size:  25 G Needle Length:  1.5 inches Approach:  Anterolateral Ultrasound Guided: No   Fluoroscopic Guidance: No   Medications:  1 mL lidocaine 1 %; 40 mg methylPREDNISolone acetate 40 MG/ML Aspiration Attempted: No   Patient tolerance:  Patient tolerated the procedure well with no immediate complications     Clinical Data: No additional  findings.   Subjective: Chief Complaint  Patient presents with  . Lower Back - Pain  . Right Ankle - Pain  . Right Knee - Pain    [name] is here to follow up on her back and right ankle. She states that its still sore and she is going to therapy. Also complaining of right knee pain, and would like to do therapy for that also. She states that if she gets down that she can't get back up due to knee.   patient states she is she continues to have back and ankle pain. Minimal improvement with physical therapy. Central low back pain into the bilateral buttock and lateral hip area.  Also complains of right knee pain with ambulating squatting and stairs. No true mechanical symptoms or feelings of instability. Pain off and on for several years. Also has some discomfort in the left knee at times. She denies history of autoimmune disease.  Review of Systems  Constitutional: Negative.   Genitourinary: Negative.   Psychiatric/Behavioral: Negative.      Objective: Vital Signs: BP (!) 152/93 (BP Location: Left Arm, Patient Position: Sitting)   Pulse 79   LMP 04/17/2016 (Exact Date)   Physical Exam  Constitutional: She is oriented to person, place, and time. She appears well-developed. No distress.  HENT:  Head: Normocephalic and atraumatic.  Eyes: Pupils are equal, round, and  reactive to light.  Pulmonary/Chest: No respiratory distress.  Abdominal: She exhibits no distension.  Neurological: She is alert and oriented to person, place, and time.  Skin: Skin is warm and dry.    Ortho Exam Gait is somewhat antalgic. Bilateral lumbar paraspinal tenderness. Tender over the bilateral hip greater scan of versus. Negative logroll. Negative straight leg raise. Bilateral knees good range of motion. Much probably patellofemoral crepitus. Right knee tender medial plica. Medial joint line tender. Specialty Comments:  No specialty comments available.  Imaging: Xr Knee 3 View Right  Result Date:  05/17/2016 X-ray right knee small spur inferior patella. Some tricompartmental narrowing but otherwise looks good for patient's age.. Impression mild degenerative changes right knee.  Xr Lumbar Spine 2-3 Views  Result Date: 05/17/2016 X-rays lumbar spine shows multilevel degenerative disc disease. facet changes.  A few millimeters of L2 retrolisthesis. Did not get flexion and extension views today. Impression multilevel lumbar spondylosis with slight L2 retrolisthesis.    PMFS History: Patient Active Problem List   Diagnosis Date Noted  . Midline low back pain without sciatica 04/19/2016  . Asthma 08/24/2014  . Chronic diastolic heart failure 99991111  . Chest pain 07/02/2013  . Hypothyroidism 07/02/2013  . Hypothyroid   . Chronic chest pain   . GERD (gastroesophageal reflux disease) 02/10/2013  . Biliary dyskinesia 12/30/2012  . Exertional dyspnea 09/19/2012  . Hyperlipidemia 05/17/2011  . Non-cardiac chest pain secondary to GERD 04/24/2011  . Hypertension, essential 04/24/2011   Past Medical History:  Diagnosis Date  . Asthma   . Biliary dyskinesia    a. s/p cholecystectomy.  . 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 Abnl stress [place] 2008, f/u cath reportedly nl. b. ETT-Myoview 04/2011 - EKG changes but normal perfusion. Cor CT - no coronary calcium, no definite stenosis though mRCA not fully evaluated. c. 10/2011 - tn elevated in [place], LHC without CAD. Started on Ranexa, anti-anginals ?microvascular dz but later stopped while in hospital on abx.  . Complication of anesthesia    hard to wake up-had to be reminded to breath  . GERD (gastroesophageal reflux disease)    a. Severe.  [place] Kitchen HTN (hypertension)   . Hx of cardiovascular stress test    [place] Myoview 8/14:  Normal, EF 74%  . Hx of echocardiogram    Echo 3/16:  Mild LVH, EF 55-60%, Gr 1 DD, trivial MR, mild LAE, normal RVF  . Hypothyroid   . MI (myocardial infarction)   . Migraine   . MRSA  infection    a. After vagal nerve stimulator at [facility] - surgical site MRSA infection, PICC placed.  . Obesity   . Palpitations    a. 08/2014: 48 hour holter with 2 PVCs otherwise normal.  . Seizure disorder ([place])    a. since childhood. b. s/p vagal nerve stimulator at [facility].  . Seizures ([place])     Family History  Problem Relation Age of Onset  . Coronary artery disease Mother 35  . Coronary artery disease Maternal Grandmother   . Cancer Other   . Hypertension Other   . Stroke Other     Past Surgical History:  Procedure Laterality Date  . [place]     placement of vagal nerve stimulator.  . CHOLECYSTECTOMY N/A 01/24/2013   Procedure: LAPAROSCOPIC CHOLECYSTECTOMY;  Surgeon: Harl Bowie, MD;  Location: [place];  Service: General;  Laterality: N/A;  . IMPLANTATION VAGAL NERVE STIMULATOR  [id number]   [facility]   Social History   Occupational History  . Not on file.   Social History Main Topics  . Smoking status: Never Smoker  . Smokeless tobacco: Never Used  . Alcohol use 0.0 oz/week    1 - 3 Glasses of wine per week     Comment: occasional  . Drug use: No  . Sexual activity: Yes    Birth control/ protection: None

## 2016-05-17 NOTE — Telephone Encounter (Signed)
I called OTC naproxen discussed. It will be cheaper she will take it with food , GI side effects discussed.

## 2016-05-17 NOTE — Telephone Encounter (Signed)
Pt primary doctor wrote rx for naproxen for pain, but is not covered by ins. Pt stated her primary doctor asked if we could write an rx for this or similar since pt was just seen with [place]...please advise. pt doctor number is [phone]. Pt number is [phone]

## 2016-05-17 NOTE — Telephone Encounter (Signed)
Please advise on message below.

## 2016-05-22 ENCOUNTER — Ambulatory Visit: Payer: Medicare Other | Admitting: Physical Therapy

## 2016-05-22 DIAGNOSIS — M545 Low back pain, unspecified: Secondary | ICD-10-CM

## 2016-05-22 DIAGNOSIS — G8929 Other chronic pain: Secondary | ICD-10-CM

## 2016-05-22 DIAGNOSIS — M62830 Muscle spasm of back: Secondary | ICD-10-CM

## 2016-05-22 DIAGNOSIS — R262 Difficulty in walking, not elsewhere classified: Secondary | ICD-10-CM

## 2016-05-22 NOTE — Therapy (Signed)
[place], [place], [address] Phone: [phone]   Fax:  [phone]  Physical Therapy Treatment  Patient Details  Name: [name] MRN: [id number] Date of Birth: 03/27/1966 Referring Provider: Dr Rodell Perna   Encounter Date: 05/22/2016      PT End of Session - 05/22/16 1457    Visit Number 3   Number of Visits 16   Date for PT Re-Evaluation 07/07/16   Authorization Type Medicare   PT Start Time 1125   PT Stop Time 1219   PT Time Calculation (min) 54 min   Activity Tolerance Patient tolerated treatment well   Behavior During Therapy [facility] for tasks assessed/performed      Past Medical History:  Diagnosis Date  . Asthma   . Biliary dyskinesia    a. s/p cholecystectomy.  . 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 Abnl stress [place] 2008, f/u cath reportedly nl. b. ETT-Myoview 04/2011 - EKG changes but normal perfusion. Cor CT - no coronary calcium, no definite stenosis though mRCA not fully evaluated. c. 10/2011 - tn elevated in [place], LHC without CAD. Started on Ranexa, anti-anginals ?microvascular dz but later stopped while in hospital on abx.  . Complication of anesthesia    hard to wake up-had to be reminded to breath  . GERD (gastroesophageal reflux disease)    a. Severe.  Marland Kitchen HTN (hypertension)   . Hx of cardiovascular stress test    [place] Myoview 8/14:  Normal, EF 74%  . Hx of echocardiogram    Echo 3/16:  Mild LVH, EF 55-60%, Gr 1 DD, trivial MR, mild LAE, normal RVF  . Hypothyroid   . MI (myocardial infarction)   . Migraine   . MRSA infection    a. After vagal nerve stimulator at [facility] - surgical site MRSA infection, PICC placed.  . Obesity   . Palpitations    a. 08/2014: 48 hour holter with 2 PVCs otherwise normal.  . Seizure disorder ([place])    a. since childhood. b. s/p vagal nerve stimulator at [facility].  . Seizures ([place])     Past Surgical History:  Procedure  Laterality Date  . [place]     placement of vagal nerve stimulator.  . CHOLECYSTECTOMY N/A 01/24/2013   Procedure: LAPAROSCOPIC CHOLECYSTECTOMY;  Surgeon: Harl Bowie, MD;  Location: [place];  Service: General;  Laterality: N/A;  . IMPLANTATION VAGAL NERVE STIMULATOR  [phone]   [facility]    There were no vitals filed for this visit.      Subjective Assessment - 05/22/16 1453    Subjective Patient had a shot in her right ankle. Her ankle is feeling much better. She is still having pain across her back.    Limitations Standing;Walking   How long can you sit comfortably? > 1 hour    How long can you stand comfortably? < 10 min    How long can you walk comfortably? < 300' without increased pain    Currently in Pain? Yes   Pain Score 6    Pain Location Back   Pain Orientation Right;Left   Pain Descriptors / Indicators Aching   Pain Type Chronic pain   Pain Onset Today   Pain Frequency Occasional   Aggravating Factors  movement    Pain Relieving Factors rest    Effect of Pain on Daily  Activities difficulty perfroming ADL's                          [facility] Adult PT Treatment/Exercise - 05/22/16 0001      Lumbar Exercises: Stretches   Single Knee to Chest Stretch Limitations 2x20sec with towel on the right    Lower Trunk Rotation Limitations x10 in low range      Lumbar Exercises: Supine   Clam Limitations yellow 2x10    Other Supine Lumbar Exercises Abdominal breathing x10 ; ball squeeze 2x10;      Manual Therapy   Manual therapy comments PROM of right ankle; PA glide on the right; trigger point release to bilateral lumbar spine, Posterior glide on the right to improve right hip flexion     Ankle Exercises: Supine   Other Supine Ankle Exercises Ankle 4 way movement 2x10 each yellow for PF/DF ; DF stretch with strap 3x15 sec hold                    PT Short Term Goals - 05/22/16 1159      PT SHORT TERM GOAL #1   Title Patient will increase lumbar flexion and extension by 25%    Time 4   Period Weeks   Status On-going     PT SHORT TERM GOAL #2   Title Patient will increase gross right ankle strength to 4/5 for ER/IR and DF    Baseline added DF resistance   Time 4   Period Weeks   Status On-going     PT SHORT TERM GOAL #3   Title Patient will demsotrate a good core contraction    Time 4   Period Weeks   Status On-going     PT SHORT TERM GOAL #4   Title Patient will be independent with initial HEP    Time 4   Period Weeks   Status On-going           PT Long Term Goals - 05/15/16 1009      PT LONG TERM GOAL #1   Title Patient will stand for 1 hour without report of ankle and back pain    Time 4   Period Weeks   Status New     PT LONG TERM GOAL #2   Title Patient will go up/down 8 steps without increased pain    Time 4   Period Weeks   Status New     PT LONG TERM GOAL #3   Title Patient will ambualte 3000' without increased pain in order to go shopping   Time 4   Status New     PT LONG TERM GOAL #4   Title Patient will return to gym with a program to improve core and lower extremity strength    Time 4   Period Weeks   Status New               Plan - 05/22/16 1154    Clinical Impression Statement Patient tolerated treatment well. She had a cavitation with ankle mobilization and feels like she had improved movement. She had improved hip flexion with hposterior hip mobilization.She was given basic core exercises at home as well as light resistance to ankle strengthening at home.    Rehab Potential Fair   PT Frequency 2x / week   PT Duration 8 weeks   PT Treatment/Interventions ADLs/Self Care Home Management;Cryotherapy;Electrical Stimulation;Gait training;Stair training;Ultrasound;Traction;Iontophoresis 4mg /ml Dexamethasone;Moist Heat;Therapeutic activities;Therapeutic exercise;Neuromuscular  re-education;Patient/family education;Manual techniques;Taping;Splinting;Energy conservation;Dry needling;Passive range of motion   PT Next Visit Plan consider maual therapy to increase hip motion, Continue with HEP; Consider light core strengthening clam shell, ball squeeze, Continue to perfrom manual DF ROM; add light resistance to ankle 4 way, patient is alergic to latex though.     PT Home Exercise Plan LTR, abdominal breathing, single knee to chest stretch, ankle    Consulted and Agree with Plan of Care Patient      Patient will benefit from skilled therapeutic intervention in order to improve the following deficits and impairments:  Abnormal gait, Decreased strength, Decreased mobility, Pain, Increased muscle spasms, Decreased endurance, Decreased safety awareness, Decreased activity tolerance  Visit Diagnosis: Chronic bilateral low back pain without sciatica  Muscle spasm of back  Difficulty in walking, not elsewhere classified     Problem List Patient Active Problem List   Diagnosis Date Noted  . Midline low back pain without sciatica 04/19/2016  . Asthma 08/24/2014  . Chronic diastolic heart failure 99991111  . Chest pain 07/02/2013  . Hypothyroidism 07/02/2013  . Hypothyroid   . Chronic chest pain   . GERD (gastroesophageal reflux disease) 02/10/2013  . Biliary dyskinesia 12/30/2012  . Exertional dyspnea 09/19/2012  . Hyperlipidemia 05/17/2011  . Non-cardiac chest pain secondary to GERD 04/24/2011  . Hypertension, essential 04/24/2011    Carney Living PT [name]  05/22/2016, 3:06 PM  [facility] [address] [place], [place], [address] Phone: [phone]   Fax:  [phone]  Name: [name] MRN: [id number] Date of Birth: 20-Jun-1965

## 2016-05-24 ENCOUNTER — Encounter: Payer: Self-pay | Admitting: Cardiology

## 2016-05-24 ENCOUNTER — Ambulatory Visit (INDEPENDENT_AMBULATORY_CARE_PROVIDER_SITE_OTHER): Payer: Medicare Other | Admitting: Cardiology

## 2016-05-24 ENCOUNTER — Encounter: Payer: Self-pay | Admitting: *Deleted

## 2016-05-24 ENCOUNTER — Ambulatory Visit: Payer: Medicare Other | Admitting: Physical Therapy

## 2016-05-24 VITALS — BP 158/100 | HR 84 | Ht 65.0 in | Wt 280.0 lb

## 2016-05-24 DIAGNOSIS — I10 Essential (primary) hypertension: Secondary | ICD-10-CM

## 2016-05-24 DIAGNOSIS — G8929 Other chronic pain: Secondary | ICD-10-CM | POA: Diagnosis not present

## 2016-05-24 DIAGNOSIS — R079 Chest pain, unspecified: Secondary | ICD-10-CM | POA: Diagnosis not present

## 2016-05-24 DIAGNOSIS — I5032 Chronic diastolic (congestive) heart failure: Secondary | ICD-10-CM

## 2016-05-24 MED ORDER — CARVEDILOL 12.5 MG PO TABS: 12.5000 mg | ORAL_TABLET | Freq: Two times a day (BID) | ORAL | 1 refills | Status: DC

## 2016-05-24 NOTE — Patient Instructions (Signed)
Medication Instructions:  Stop metoprolol-both metoprolol succinate and metoprolol tartrate.  Start coreg (carvedilol) 12.5mg  two times a day  Labwork: None   Testing/Procedures: Your physician has requested that you have a right heart catheterization. This is scheduled for 06/01/16-see instruction sheet    Follow-Up: Your physician recommends that you schedule a follow-up appointment in: the Hypertension Clinic with the pharmacist in about 2 weeks-reschedule the appt you have for 06/01/16  Your physician recommends that you schedule a follow-up appointment in: 3 weeks with PA or NP      Any Other Special Instructions Will Be Listed Below (If Applicable).     If you need a refill on your cardiac medications before your next appointment, please call your pharmacy.

## 2016-05-25 ENCOUNTER — Ambulatory Visit (HOSPITAL_COMMUNITY): Payer: Medicare Other

## 2016-05-26 NOTE — Progress Notes (Signed)
Patient ID: [name], female   DOB: Dec 16, 1965, 50 y.o.   MRN: [id number] PCP: Dr. Criss Rosales [facility]), Dr. Emilio Math [place]over Mealy)  50 yo with history of chest pain and prior negative caths as well as migraines, asthma, and HTN presents for cardiology followup.  She has had chest pain chronically over a number of years.  She had an abnormal stress test in [place] in 2008.  She was then sent for a cardiac cath, which she says was normal.  We had her do an ETT-myoview in this office in 11/12.  This showed ischemic-type ST depression on exercise ECG, but there was no evidence for ischemia on myoview images.  EF was 66%.  Given ongoing chest pain and ischemic ECG changes with normal perfusion images (equivocal study), I had her get a coronary CT angiogram.  This was a difficult study because we were not able to get her HR as low as would be ideal.  However, she had no coronary calcium and no definite stenosis was seen (though mid-RCA was not fully evaluated due to artifact).  I think that this was a negative study.  She continued to have chest tightness with walking long distances, climbing a flight of steps, and housework.  In 5/13, she went to the ER in [place] with particularly bad exertional chest pain that lasted a prolonged time.  Troponin was mildly elevated with normal CKMB.  She had a left heart cath in the hospital and [place] which showed no angiographic CAD.  She was sent home on Imdur and amlodipine.  Echo showed normal EF.  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  She stopped the amlodipine due to ankle swelling.    In 12/13, she had her vagal nerve stimulator replaced at [facility].  This was complicated by a surgical site MRSA infection, and she had a PICC line placed for antibiotics.  Ranolazine was stopped at [facility] while she was getting antibiotics.  She did not notice much change in her chest pain pattern off ranolazine.    Last Cardiolite  in 10/17 showed EF 49% but normal perfusion.  Echo in 10/17 done because of low EF on Cardiolite showed normal EF, 60-65%.    She has been fairly stable recently.  She still has chest pain/central tightness on most days.  This is chronic and not triggered by exertion.  Ranolazine has not helped in the past.  She is short of breath after walking 50 yards or walking up steps.  No lightheadedness.  BP has been running high and weight is up.    Labs (9/12): creatinine 0.9 Labs (11/12): LDL 130, HDL 49, TSH normal, K 3.6, creatinine 0.8, Lp(a) 31 Labs (5/13): K 4, creatinine 0.7, LDL 91, TnI 0.86 => 0.98 with normal CKMB Labs (7/13): negative troponin Labs (12/13): TSH normal Labs (2/14): CPK 215, K 3.8, creatinine 0.9 Labs (3/14): BNP 29 Labs (9/16): K 3.7, creatinine 0.7, BNP 12 Labs (10/17): K 3.7, creatinine 0.7, pro-BNP 12  PMH:  1. Chest pain: Multiple episodes over the years.  She had an abnormal stress test in 2008 in [place] and it sounds like she went to [facility] for a cardiac catheterization which per her report was normal.   Stress echo at [facility] in 2010 was submaximal but showed no evidence for ischemia.  ETT-myoview (11/12): 7'15", EF 66%, significant ST depression on exercise ECG, apical thinning on myoview images but no evidence for ischemia. Coronary CT  angiogram (11/12): calcium score 0, difficult study with gating artifact, mid RCA nonevaluatable but no stenosis elsewhere in the coronaries.   Echo (11/12): EF 55-60%, mild LV hypertrophy, mild MR.  NSTEMI in [place] in 5/13 with elevated troponin but LHC showed normal coronaries.  Echo (5/13) with EF 60-65%, normal valve, no LVH.  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  Cardiolite (8/14) with EF 74%, no ischemia or infarction.  Echo (3/16) with EF 55-60%, mild LVH, normal RV size and systolic function.  - Cardiolite (10/17) with EF 49%, no ischemia/infarction.  - Echo (10/17) with EF 60-65%, grade II diastolic  dysfunction.  2. Migraines: Uses triptan medications.  3. HTN: edema with amlodipine.  4. Seizure disorder: Has vagus nerve stimulator.  5. Hypothyroidism 6. Asthma since childhood 7. PVCs: Documented by holter in 3/16.   SH: Moved to [place] from [place].  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   FH: Mother with MI at 37.  Grandmother with MI at 49.   ROS: All systems reviewed and negative except as per HPI.   Current Outpatient Prescriptions  Medication Sig Dispense Refill  . albuterol (PROAIR HFA) 108 (90 BASE) MCG/ACT inhaler Inhale 2 puffs into the lungs every 4 (four) hours as needed.    . baclofen (LIORESAL) 10 MG tablet Take 10 mg by mouth 2 (two) times daily as needed.  0  . DULoxetine (CYMBALTA) 30 MG capsule Take 1-2 capsules by mouth 2 (two) times daily as needed.  2  . fluticasone (FLONASE) 50 MCG/ACT nasal spray Place 1 spray into both nostrils 2 (two) times daily.    . furosemide (LASIX) 40 MG tablet Take 1 tablet by mouth daily.  0  . isosorbide mononitrate (IMDUR) 60 MG 24 hr tablet TAKE 1 TABLET (60 MG TOTAL) BY MOUTH 2 (TWO) TIMES DAILY. 60 tablet 6  . levothyroxine (SYNTHROID, LEVOTHROID) 50 MCG tablet Take 50 mcg by mouth daily.     Marland Kitchen losartan (COZAAR) 100 MG tablet Take 1 tablet (100 mg total) by mouth daily. 90 tablet 3  . mometasone-formoterol (DULERA) 100-5 MCG/ACT AERO Inhale 2 puffs into the lungs 2 (two) times daily.    . montelukast (SINGULAIR) 10 MG tablet Take 10 mg by mouth at bedtime. 1 TAB QHS    . naproxen (NAPROSYN) 500 MG tablet TAKE 1 TABLET TWICE A DAY FOR 3 DAYS BEFORE PERIOD,STOP WHEN PERIOD STARTS  2  . nitroGLYCERIN (NITRODUR - DOSED IN MG/24 HR) 0.2 mg/hr patch PLACE 1 PATCH ONTO THE SKIN DAILY. 30 patch 1  . nitroGLYCERIN (NITROSTAT) 0.4 MG SL tablet PLACE 1 TABLET (0.4 MG TOTAL) UNDER THE TONGUE EVERY 5 (FIVE) MINUTES AS NEEDED FOR CHEST PAIN. 25 tablet 3  . omeprazole (PRILOSEC) 40 MG capsule Take 1  capsule by mouth daily.    . promethazine (PHENERGAN) 25 MG tablet Take 25 mg by mouth as needed (for nausea). 1 TAB PO Q 4-6 HRS    . ranitidine (ZANTAC) 150 MG tablet Take 150 mg by mouth 2 (two) times daily.  11  . telmisartan (MICARDIS) 40 MG tablet Take 40 mg by mouth daily.  0  . topiramate (TOPAMAX) 100 MG tablet Take 200 mg by mouth 2 (two) times daily.    . traZODone (DESYREL) 50 MG tablet Take 50 mg by mouth at bedtime.    Marland Kitchen trimethoprim-polymyxin b (POLYTRIM) ophthalmic solution Place 1 drop into both eyes 4 (four) times daily. For 5 days 10 mL 0  .  venlafaxine (EFFEXOR) 75 MG tablet Take 75 mg by mouth 2 (two) times daily.    . verapamil (CALAN) 120 MG tablet Take 120 mg by mouth 3 (three) times daily.    . Vitamin D, Ergocalciferol, (DRISDOL) 50000 units CAPS capsule TAKE ONE CAPSULE BY MOUTH EACH WEEK FOR 12 WEEKS  0  . carvedilol (COREG) 12.5 MG tablet Take 1 tablet (12.5 mg total) by mouth 2 (two) times daily. 180 tablet 1   No current facility-administered medications for this visit.     BP (!) 158/100   Pulse 84   Ht 5\' 5"  (1.651 m)   Wt 280 lb (127 kg)   BMI 46.59 kg/m  General: NAD, overweight Neck: No JVD, no thyromegaly or thyroid nodule.  Lungs: Clear to auscultation bilaterally with normal respiratory effort. CV: Nondisplaced PMI.  Heart regular S1/S2, no S3/S4, 1/6 SEM.  No ankle edema.  No carotid bruit.  Normal pedal pulses.  Abdomen: Soft, nontender, no hepatosplenomegaly, no distention.  Neurologic: Alert and oriented x 3.  Psych: Normal affect. Extremities: No clubbing or cyanosis.   Assessment/Plan: 1. Chest pain: Extensive past workup.  No change in pattern. Last Cardiolite in 10/17 with no ischemia.  It is possible that she has microvascular angina (versus vasospasm).  She is not taking amlodipine due to lower extremity swelling and is not taking ranolazine since it seemed ineffective.  She will continue beta blocker, verapamil and Imdur.  She is off  aspirin with gastric polyps. 2. Exertional dyspnea: Ongoing significant exertional dyspnea, NYHA class III symptoms.  Normal EF on echo.  She is on Lasix 40 mg daily, increased by PCP last week.  Very difficult to assess volume, does not look volume overloaded but weight is up.  ?asthma playing a role in her symptoms.   - Given symptoms and difficulty assessing volume status, think she needs RHC.  We discuss risks/benefits and she agrees to procedure.  Will plan for next week.  3. Hyperlipidemia: She is off statin due to myalgias and mildly elevated CPK.  4. HTN: BP running high. Stop metoprolol, use Coreg 12.5 mg bid.  She will followup in 2 wks in HTN clinic.   Loralie Champagne 05/26/2016

## 2016-05-30 ENCOUNTER — Encounter: Payer: Self-pay | Admitting: *Deleted

## 2016-05-30 ENCOUNTER — Ambulatory Visit: Payer: Medicare Other | Admitting: Physical Therapy

## 2016-06-01 ENCOUNTER — Ambulatory Visit: Payer: Medicare Other

## 2016-06-01 ENCOUNTER — Encounter (HOSPITAL_COMMUNITY)
Admission: RE | Disposition: A | Discharge status: Home / Self Care | Payer: Self-pay | Source: Ambulatory Visit | Attending: Cardiology

## 2016-06-01 ENCOUNTER — Encounter (HOSPITAL_COMMUNITY): Payer: Self-pay | Admitting: Cardiology

## 2016-06-01 ENCOUNTER — Ambulatory Visit (HOSPITAL_COMMUNITY)
Admission: RE | Admit: 2016-06-01 | Discharge: 2016-06-01 | Disposition: A | Discharge status: Home / Self Care | Payer: Medicare Other | Source: Ambulatory Visit | Attending: Cardiology | Admitting: Cardiology

## 2016-06-01 DIAGNOSIS — I5032 Chronic diastolic (congestive) heart failure: Secondary | ICD-10-CM | POA: Diagnosis not present

## 2016-06-01 DIAGNOSIS — E039 Hypothyroidism, unspecified: Secondary | ICD-10-CM | POA: Diagnosis not present

## 2016-06-01 DIAGNOSIS — G43909 Migraine, unspecified, not intractable, without status migrainosus: Secondary | ICD-10-CM | POA: Insufficient documentation

## 2016-06-01 DIAGNOSIS — I493 Ventricular premature depolarization: Secondary | ICD-10-CM | POA: Insufficient documentation

## 2016-06-01 DIAGNOSIS — E785 Hyperlipidemia, unspecified: Secondary | ICD-10-CM | POA: Insufficient documentation

## 2016-06-01 DIAGNOSIS — Z8614 Personal history of Methicillin resistant Staphylococcus aureus infection: Secondary | ICD-10-CM | POA: Insufficient documentation

## 2016-06-01 DIAGNOSIS — I252 Old myocardial infarction: Secondary | ICD-10-CM | POA: Insufficient documentation

## 2016-06-01 DIAGNOSIS — J45909 Unspecified asthma, uncomplicated: Secondary | ICD-10-CM | POA: Insufficient documentation

## 2016-06-01 DIAGNOSIS — I110 Hypertensive heart disease with heart failure: Secondary | ICD-10-CM | POA: Insufficient documentation

## 2016-06-01 DIAGNOSIS — Z7951 Long term (current) use of inhaled steroids: Secondary | ICD-10-CM | POA: Diagnosis not present

## 2016-06-01 DIAGNOSIS — M791 Myalgia: Secondary | ICD-10-CM | POA: Insufficient documentation

## 2016-06-01 DIAGNOSIS — G40909 Epilepsy, unspecified, not intractable, without status epilepticus: Secondary | ICD-10-CM | POA: Diagnosis not present

## 2016-06-01 DIAGNOSIS — K317 Polyp of stomach and duodenum: Secondary | ICD-10-CM | POA: Insufficient documentation

## 2016-06-01 DIAGNOSIS — I5030 Unspecified diastolic (congestive) heart failure: Secondary | ICD-10-CM | POA: Diagnosis not present

## 2016-06-01 HISTORY — PX: CARDIAC CATHETERIZATION: SHX172

## 2016-06-01 LAB — BASIC METABOLIC PANEL
Anion gap: 10 (ref 5–15)
BUN: 9 mg/dL (ref 6–20)
CO2: 21 mmol/L — ABNORMAL LOW (ref 22–32)
Calcium: 8.6 mg/dL — ABNORMAL LOW (ref 8.9–10.3)
Chloride: 108 mmol/L (ref 101–111)
Creatinine, Ser: 0.72 mg/dL (ref 0.44–1.00)
GFR calc Af Amer: >60 mL/min (ref >60)
GFR calc non Af Amer: >60 mL/min (ref >60)
Glucose, Bld: 98 mg/dL (ref 65–99)
Potassium: 3.6 mmol/L (ref 3.5–5.1)
Sodium: 139 mmol/L (ref 135–145)

## 2016-06-01 LAB — CBC
HCT: 37 % (ref 36.0–46.0)
Hemoglobin: 11.8 g/dL — ABNORMAL LOW (ref 12.0–15.0)
MCH: 26.4 pg (ref 26.0–34.0)
MCHC: 31.9 g/dL (ref 30.0–36.0)
MCV: 82.8 fL (ref 78.0–100.0)
Platelets: 229 10*3/uL (ref 150–400)
RBC: 4.47 MIL/uL (ref 3.87–5.11)
RDW: 15.7 % — ABNORMAL HIGH (ref 11.5–15.5)
WBC: 6.1 10*3/uL (ref 4.0–10.5)

## 2016-06-01 LAB — POCT I-STAT 3, VENOUS BLOOD GAS (G3P V)
Acid-base deficit: 4 mmol/L — ABNORMAL HIGH (ref 0.0–2.0)
Acid-base deficit: 5 mmol/L — ABNORMAL HIGH (ref 0.0–2.0)
Bicarbonate: 20.8 mmol/L (ref 20.0–28.0)
Bicarbonate: 21.5 mmol/L (ref 20.0–28.0)
O2 Saturation: 66 %
O2 Saturation: 66 %
TCO2: 22 mmol/L (ref 0–100)
TCO2: 23 mmol/L (ref 0–100)
pCO2, Ven: 39.7 mmHg — ABNORMAL LOW (ref 44.0–60.0)
pCO2, Ven: 41.6 mmHg — ABNORMAL LOW (ref 44.0–60.0)
pH, Ven: 7.322 (ref 7.250–7.430)
pH, Ven: 7.326 (ref 7.250–7.430)
pO2, Ven: 37 mmHg (ref 32.0–45.0)
pO2, Ven: 37 mmHg (ref 32.0–45.0)

## 2016-06-01 LAB — PROTIME-INR
INR: 1.05
Prothrombin Time: 13.8 seconds (ref 11.4–15.2)

## 2016-06-01 SURGERY — RIGHT HEART CATH
Anesthesia: LOCAL

## 2016-06-01 MED ORDER — SODIUM CHLORIDE 0.9% FLUSH: 3.0000 mL | INTRAVENOUS | Status: DC | PRN

## 2016-06-01 MED ORDER — ONDANSETRON HCL 4 MG/2ML IJ SOLN: 4.0000 mg | Freq: Four times a day (QID) | INTRAMUSCULAR | Status: DC | PRN

## 2016-06-01 MED ORDER — SODIUM CHLORIDE 0.9 % IV SOLN
INTRAVENOUS | Status: DC
  Administered 2016-06-01: 10:00:00 via INTRAVENOUS

## 2016-06-01 MED ORDER — LIDOCAINE HCL (PF) 1 % IJ SOLN
INTRAMUSCULAR | Status: AC
  Filled 2016-06-01: qty 30

## 2016-06-01 MED ORDER — SODIUM CHLORIDE 0.9% FLUSH: 3.0000 mL | Freq: Two times a day (BID) | INTRAVENOUS | Status: DC

## 2016-06-01 MED ORDER — ACETAMINOPHEN 325 MG PO TABS: 650.0000 mg | ORAL_TABLET | ORAL | Status: DC | PRN

## 2016-06-01 MED ORDER — FENTANYL CITRATE (PF) 100 MCG/2ML IJ SOLN
INTRAMUSCULAR | Status: DC | PRN
  Administered 2016-06-01: 25 ug via INTRAVENOUS

## 2016-06-01 MED ORDER — FENTANYL CITRATE (PF) 100 MCG/2ML IJ SOLN
INTRAMUSCULAR | Status: AC
  Filled 2016-06-01: qty 2

## 2016-06-01 MED ORDER — LIDOCAINE HCL (PF) 1 % IJ SOLN
INTRAMUSCULAR | Status: DC | PRN
  Administered 2016-06-01: 1 mL

## 2016-06-01 MED ORDER — MIDAZOLAM HCL 2 MG/2ML IJ SOLN
INTRAMUSCULAR | Status: AC
  Filled 2016-06-01: qty 2

## 2016-06-01 MED ORDER — MIDAZOLAM HCL 2 MG/2ML IJ SOLN
INTRAMUSCULAR | Status: DC | PRN
  Administered 2016-06-01: 1 mg via INTRAVENOUS

## 2016-06-01 MED ORDER — HEPARIN (PORCINE) IN NACL 2-0.9 UNIT/ML-% IJ SOLN
INTRAMUSCULAR | Status: AC
  Filled 2016-06-01: qty 1000

## 2016-06-01 MED ORDER — ASPIRIN 81 MG PO CHEW
CHEWABLE_TABLET | ORAL | Status: AC
  Administered 2016-06-01: 81 mg
  Filled 2016-06-01: qty 1

## 2016-06-01 MED ORDER — HEPARIN (PORCINE) IN NACL 2-0.9 UNIT/ML-% IJ SOLN
INTRAMUSCULAR | Status: DC | PRN
  Administered 2016-06-01: 500 mL

## 2016-06-01 MED ORDER — SODIUM CHLORIDE 0.9 % IV SOLN: 250.0000 mL | INTRAVENOUS | Status: DC | PRN

## 2016-06-01 SURGICAL SUPPLY — 6 items
CATH BALLN WEDGE 5F 110CM (CATHETERS) ×2 IMPLANT
PACK CARDIAC CATHETERIZATION (CUSTOM PROCEDURE TRAY) ×2 IMPLANT
SHEATH FAST CATH BRACH 5F 5CM (SHEATH) ×2 IMPLANT
TRANSDUCER W/STOPCOCK (MISCELLANEOUS) ×2 IMPLANT
TUBING ART PRESS 72  MALE/FEM (TUBING) ×1
TUBING ART PRESS 72 MALE/FEM (TUBING) ×1 IMPLANT

## 2016-06-01 NOTE — Progress Notes (Signed)
Patient complained of feeling heart futtering.  Dr. [name] notified.  Order for EKG obtained.  Patient to go home if normal results on EKG per  Dr. [name].

## 2016-06-01 NOTE — Discharge Instructions (Signed)
Angiogram, Care After °These instructions give you information about caring for yourself after your procedure. Your doctor may also give you more specific instructions. Call your doctor if you have any problems or questions after your procedure. °Follow these instructions at home: °· Take medicines only as told by your doctor. °· Follow your doctor's instructions about: °¨ Care of the area where the tube was inserted. °¨ Bandage (dressing) changes and removal. °· You may shower 24-48 hours after the procedure or as told by your doctor. °· Do not take baths, swim, or use a hot tub until your doctor approves. °· Every day, check the area where the tube was inserted. Watch for: °¨ Redness, swelling, or pain. °¨ Fluid, blood, or pus. °· Do not apply powder or lotion to the site. °· Do not lift anything that is heavier than 10 lb (4.5 kg) for 5 days or as told by your doctor. °· Ask your doctor when you can: °¨ Return to work or school. °¨ Do physical activities or play sports. °¨ Have sex. °· Do not drive or operate heavy machinery for 24 hours or as told by your doctor. °· Have someone with you for the first 24 hours after the procedure. °· Keep all follow-up visits as told by your doctor. This is important. °Contact a health care provider if: °· You have a fever. °· You have chills. °· You have more bleeding from the area where the tube was inserted. Hold pressure on the area. °· You have redness, swelling, or pain in the area where the tube was inserted. °· You have fluid or pus coming from the area. °Get help right away if: °· You have a lot of pain in the area where the tube was inserted. °· The area where the tube was inserted is bleeding, and the bleeding does not stop after 30 minutes of holding steady pressure on the area. °· The area near or just beyond the insertion site becomes pale, cool, tingly, or numb. °This information is not intended to replace advice given to you by your health care provider. Make  sure you discuss any questions you have with your health care provider. °Document Released: 08/18/2008 Document Revised: 10/28/2015 Document Reviewed: 10/23/2012 °Elsevier Interactive Patient Education © 2017 [employer] ° °

## 2016-06-01 NOTE — H&P (View-Only) (Signed)
Patient ID: [name], female   DOB: April 28, 1966, 50 y.o.   MRN: [id number] PCP: Dr. Criss Rosales [facility] Dr. Emilio Math [place] Mealy)  50 yo with history of chest pain and prior negative caths as well as migraines, asthma, and HTN presents for cardiology followup.  She has had chest pain chronically over a number of years.  She had an abnormal stress test in [place] in 2008.  She was then sent for a cardiac cath, which she says was normal.  We had her do an ETT-myoview in this office in 11/12.  This showed ischemic-type ST depression on exercise ECG, but there was no evidence for ischemia on myoview images.  EF was 66%.  Given ongoing chest pain and ischemic ECG changes with normal perfusion images (equivocal study), I had her get a coronary CT angiogram.  This was a difficult study because we were not able to get her HR as low as would be ideal.  However, she had no coronary calcium and no definite stenosis was seen (though mid-RCA was not fully evaluated due to artifact).  I think that this was a negative study.  She continued to have chest tightness with walking long distances, climbing a flight of steps, and housework.  In 5/13, she went to the ER in [place] with particularly bad exertional chest pain that lasted a prolonged time.  Troponin was mildly elevated with normal CKMB.  She had a left heart cath in the hospital and [place] which showed no angiographic CAD.  She was sent home on Imdur and amlodipine.  Echo showed normal EF.  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  She stopped the amlodipine due to ankle swelling.    In 12/13, she had her vagal nerve stimulator replaced at [facility]  This was complicated by a surgical site MRSA infection, and she had a PICC line placed for antibiotics.  Ranolazine was stopped at [facility] while she was getting antibiotics.  She did not notice much change in her chest pain pattern off ranolazine.    Last Cardiolite  in 10/17 showed EF 49% but normal perfusion.  Echo in 10/17 done because of low EF on Cardiolite showed normal EF, 60-65%.    She has been fairly stable recently.  She still has chest pain/central tightness on most days.  This is chronic and not triggered by exertion.  Ranolazine has not helped in the past.  She is short of breath after walking 50 yards or walking up steps.  No lightheadedness.  BP has been running high and weight is up.    Labs (9/12): creatinine 0.9 Labs (11/12): LDL 130, HDL 49, TSH normal, K 3.6, creatinine 0.8, Lp(a) 31 Labs (5/13): K 4, creatinine 0.7, LDL 91, TnI 0.86 => 0.98 with normal CKMB Labs (7/13): negative troponin Labs (12/13): TSH normal Labs (2/14): CPK 215, K 3.8, creatinine 0.9 Labs (3/14): BNP 29 Labs (9/16): K 3.7, creatinine 0.7, BNP 12 Labs (10/17): K 3.7, creatinine 0.7, pro-BNP 12  PMH:  1. Chest pain: Multiple episodes over the years.  She had an abnormal stress test in 2008 in [place] and it sounds like she went to [facility] for a cardiac catheterization which per her report was normal.   Stress echo at [facility] in 2010 was submaximal but showed no evidence for ischemia.  ETT-myoview (11/12): 7'15", EF 66%, significant ST depression on exercise ECG, apical thinning on myoview images but no evidence for ischemia. Coronary CT  angiogram (11/12): calcium score 0, difficult study with gating artifact, mid RCA nonevaluatable but no stenosis elsewhere in the coronaries.   Echo (11/12): EF 55-60%, mild LV hypertrophy, mild MR.  NSTEMI in [place] in 5/13 with elevated troponin but LHC showed normal coronaries.  Echo (5/13) with EF 60-65%, normal valve, no LVH.  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  Cardiolite (8/14) with EF 74%, no ischemia or infarction.  Echo (3/16) with EF 55-60%, mild LVH, normal RV size and systolic function.  - Cardiolite (10/17) with EF 49%, no ischemia/infarction.  - Echo (10/17) with EF 60-65%, grade II diastolic  dysfunction.  2. Migraines: Uses triptan medications.  3. HTN: edema with amlodipine.  4. Seizure disorder: Has vagus nerve stimulator.  5. Hypothyroidism 6. Asthma since childhood 7. PVCs: Documented by holter in 3/16.   SH: Moved to [place] from [place].  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   FH: Mother with MI at 67.  Grandmother with MI at 65.   ROS: All systems reviewed and negative except as per HPI.   Current Outpatient Prescriptions  Medication Sig Dispense Refill  . albuterol (PROAIR HFA) 108 (90 BASE) MCG/ACT inhaler Inhale 2 puffs into the lungs every 4 (four) hours as needed.    . baclofen (LIORESAL) 10 MG tablet Take 10 mg by mouth 2 (two) times daily as needed.  0  . DULoxetine (CYMBALTA) 30 MG capsule Take 1-2 capsules by mouth 2 (two) times daily as needed.  2  . fluticasone (FLONASE) 50 MCG/ACT nasal spray Place 1 spray into both nostrils 2 (two) times daily.    . furosemide (LASIX) 40 MG tablet Take 1 tablet by mouth daily.  0  . isosorbide mononitrate (IMDUR) 60 MG 24 hr tablet TAKE 1 TABLET (60 MG TOTAL) BY MOUTH 2 (TWO) TIMES DAILY. 60 tablet 6  . levothyroxine (SYNTHROID, LEVOTHROID) 50 MCG tablet Take 50 mcg by mouth daily.     Marland Kitchen losartan (COZAAR) 100 MG tablet Take 1 tablet (100 mg total) by mouth daily. 90 tablet 3  . mometasone-formoterol (DULERA) 100-5 MCG/ACT AERO Inhale 2 puffs into the lungs 2 (two) times daily.    . montelukast (SINGULAIR) 10 MG tablet Take 10 mg by mouth at bedtime. 1 TAB QHS    . naproxen (NAPROSYN) 500 MG tablet TAKE 1 TABLET TWICE A DAY FOR 3 DAYS BEFORE PERIOD,STOP WHEN PERIOD STARTS  2  . nitroGLYCERIN (NITRODUR - DOSED IN MG/24 HR) 0.2 mg/hr patch PLACE 1 PATCH ONTO THE SKIN DAILY. 30 patch 1  . nitroGLYCERIN (NITROSTAT) 0.4 MG SL tablet PLACE 1 TABLET (0.4 MG TOTAL) UNDER THE TONGUE EVERY 5 (FIVE) MINUTES AS NEEDED FOR CHEST PAIN. 25 tablet 3  . omeprazole (PRILOSEC) 40 MG capsule Take 1  capsule by mouth daily.    . promethazine (PHENERGAN) 25 MG tablet Take 25 mg by mouth as needed (for nausea). 1 TAB PO Q 4-6 HRS    . ranitidine (ZANTAC) 150 MG tablet Take 150 mg by mouth 2 (two) times daily.  11  . telmisartan (MICARDIS) 40 MG tablet Take 40 mg by mouth daily.  0  . topiramate (TOPAMAX) 100 MG tablet Take 200 mg by mouth 2 (two) times daily.    . traZODone (DESYREL) 50 MG tablet Take 50 mg by mouth at bedtime.    Marland Kitchen trimethoprim-polymyxin b (POLYTRIM) ophthalmic solution Place 1 drop into both eyes 4 (four) times daily. For 5 days 10 mL 0  .  venlafaxine (EFFEXOR) 75 MG tablet Take 75 mg by mouth 2 (two) times daily.    . verapamil (CALAN) 120 MG tablet Take 120 mg by mouth 3 (three) times daily.    . Vitamin D, Ergocalciferol, (DRISDOL) 50000 units CAPS capsule TAKE ONE CAPSULE BY MOUTH EACH WEEK FOR 12 WEEKS  0  . carvedilol (COREG) 12.5 MG tablet Take 1 tablet (12.5 mg total) by mouth 2 (two) times daily. 180 tablet 1   No current facility-administered medications for this visit.     BP (!) 158/100   Pulse 84   Ht 5\' 5"  (1.651 m)   Wt 280 lb (127 kg)   BMI 46.59 kg/m  General: NAD, overweight Neck: No JVD, no thyromegaly or thyroid nodule.  Lungs: Clear to auscultation bilaterally with normal respiratory effort. CV: Nondisplaced PMI.  Heart regular S1/S2, no S3/S4, 1/6 SEM.  No ankle edema.  No carotid bruit.  Normal pedal pulses.  Abdomen: Soft, nontender, no hepatosplenomegaly, no distention.  Neurologic: Alert and oriented x 3.  Psych: Normal affect. Extremities: No clubbing or cyanosis.   Assessment/Plan: 1. Chest pain: Extensive past workup.  No change in pattern. Last Cardiolite in 10/17 with no ischemia.  It is possible that she has microvascular angina (versus vasospasm).  She is not taking amlodipine due to lower extremity swelling and is not taking ranolazine since it seemed ineffective.  She will continue beta blocker, verapamil and Imdur.  She is off  aspirin with gastric polyps. 2. Exertional dyspnea: Ongoing significant exertional dyspnea, NYHA class III symptoms.  Normal EF on echo.  She is on Lasix 40 mg daily, increased by PCP last week.  Very difficult to assess volume, does not look volume overloaded but weight is up.  ?asthma playing a role in her symptoms.   - Given symptoms and difficulty assessing volume status, think she needs RHC.  We discuss risks/benefits and she agrees to procedure.  Will plan for next week.  3. Hyperlipidemia: She is off statin due to myalgias and mildly elevated CPK.  4. HTN: BP running high. Stop metoprolol, use Coreg 12.5 mg bid.  She will followup in 2 wks in HTN clinic.   Loralie Champagne 05/26/2016

## 2016-06-01 NOTE — Interval H&P Note (Signed)
History and Physical Interval Note:  06/01/2016 12:50 PM  [name]  has presented today for surgery, with the diagnosis of chf  The various methods of treatment have been discussed with the patient and family. After consideration of risks, benefits and other options for treatment, the patient has consented to  Procedure(s): Right Heart Cath (N/A) as a surgical intervention .  The patient's history has been reviewed, patient examined, no change in status, stable for surgery.  I have reviewed the patient's chart and labs.  Questions were answered to the patient's satisfaction.     Aujanae Mccullum [employer]

## 2016-06-02 ENCOUNTER — Ambulatory Visit: Payer: Medicare Other | Admitting: Physical Therapy

## 2016-06-06 ENCOUNTER — Ambulatory Visit (HOSPITAL_COMMUNITY): Admission: RE | Admit: 2016-06-06 | Payer: Medicare Other | Source: Ambulatory Visit

## 2016-06-08 ENCOUNTER — Ambulatory Visit: Payer: Medicare Other

## 2016-06-08 NOTE — Progress Notes (Deleted)
Patient ID: [name]                 DOB: June 24, 1965                      MRN: [id number]     HPI: [name] is a 51 y.o. female patient of Dr. [name] with [facility] who presents today for hypertension evaluation. She has a prior history of chest pain with prior negative stress tests. She underwent right heart cath on 06/01/16 which revealed normal pressures and cardiac output. She was seen by Dr. [name] about 2 weeks ago and her metoprolol was changed to carvedilol.    Cardiac Hx: CHF, HTN, Takotsubo cardiomyopathy, HLD, asthma, PVCs, NSTEMI in 2013  Current HTN meds:  Carvedilol 12.5mg  BID Verapamil 120mg  TID Metoprolol 50mg  BID Losartan 100mg  daily  Isosorbide mononitrate 60mg  daily  Furosemide 40mg  daily  Previously tried:  Amlodipine - LLE  BP goal: <130/80  Family History: Mother with MI at 48.  Grandmother with MI at 40  Social History: Denies tobacco.   Diet:   Exercise:   Home BP readings:   Wt Readings from Last 3 Encounters:  06/01/16 275 lb (124.7 kg)  05/24/16 280 lb (127 kg)  04/19/16 265 lb (120.2 kg)   BP Readings from Last 3 Encounters:  06/01/16 (!) 148/92  05/24/16 (!) 158/100  05/17/16 (!) 152/93   Pulse Readings from Last 3 Encounters:  06/01/16 82  05/24/16 84  05/17/16 79    Renal function: Estimated Creatinine Clearance: 109.2 mL/min (by C-G formula based on SCr of 0.72 mg/dL).  Past Medical History:  Diagnosis Date  . Asthma   . Biliary dyskinesia    a. s/p cholecystectomy.  . 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 Abnl stress [place] 2008, f/u cath reportedly nl. b. ETT-Myoview 04/2011 - EKG changes but normal perfusion. Cor CT - no coronary calcium, no definite stenosis though mRCA not fully evaluated. c. 10/2011 - tn elevated in [place], LHC without CAD. Started on Ranexa, anti-anginals ?microvascular dz but later stopped while in hospital on abx.  . Complication of anesthesia    hard to wake up-had to be  reminded to breath  . GERD (gastroesophageal reflux disease)    a. Severe.  Marland Kitchen HTN (hypertension)   . Hx of cardiovascular stress test    Lex Myoview 8/14:  Normal, EF 74%  . Hx of echocardiogram    Echo 3/16:  Mild LVH, EF 55-60%, Gr 1 DD, trivial MR, mild LAE, normal RVF  . Hypothyroid   . MI (myocardial infarction)   . Migraine   . MRSA infection    a. After vagal nerve stimulator at [facility] - surgical site MRSA infection, PICC placed.  . Obesity   . Palpitations    a. 08/2014: 48 hour holter with 2 PVCs otherwise normal.  . Seizure disorder ([place])    a. since childhood. b. s/p vagal nerve stimulator at [facility].  . Seizures [facility])     Current Outpatient Prescriptions on File Prior to Visit  Medication Sig Dispense Refill  . albuterol (PROAIR HFA) 108 (90 BASE) MCG/ACT inhaler Inhale 2 puffs into the lungs every 4 (four) hours as needed for wheezing or shortness of breath.     . baclofen (LIORESAL) 10 MG tablet Take 10 mg by mouth 2 (two) times daily as needed for muscle spasms.   0  . carvedilol (COREG) 12.5  MG tablet Take 1 tablet (12.5 mg total) by mouth 2 (two) times daily. (Patient not taking: Reported on 06/01/2016) 180 tablet 1  . DULoxetine (CYMBALTA) 30 MG capsule Take 30-60 capsules by mouth 2 (two) times daily as needed (for back pain).   2  . fluticasone (FLONASE) 50 MCG/ACT nasal spray Place 1 spray into both nostrils 2 (two) times daily as needed for allergies.     . furosemide (LASIX) 40 MG tablet Take 40 mg by mouth daily.   0  . isosorbide mononitrate (IMDUR) 60 MG 24 hr tablet TAKE 1 TABLET (60 MG TOTAL) BY MOUTH 2 (TWO) TIMES DAILY. 60 tablet 6  . levothyroxine (SYNTHROID, LEVOTHROID) 50 MCG tablet Take 50 mcg by mouth daily.     Marland Kitchen losartan (COZAAR) 100 MG tablet Take 1 tablet (100 mg total) by mouth daily. 90 tablet 3  . metoprolol tartrate (LOPRESSOR) 25 MG tablet Take 50 mg by mouth 2 (two) times daily.    . mometasone-formoterol (DULERA) 100-5 MCG/ACT AERO  Inhale 2 puffs into the lungs 2 (two) times daily.    . montelukast (SINGULAIR) 10 MG tablet Take 10 mg by mouth daily as needed (for allergies).     . naproxen (NAPROSYN) 500 MG tablet Take 500 mg by mouth twice daily as needed for pain  2  . nitroGLYCERIN (NITRODUR - DOSED IN MG/24 HR) 0.2 mg/hr patch PLACE 1 PATCH ONTO THE SKIN DAILY. 30 patch 1  . nitroGLYCERIN (NITROSTAT) 0.4 MG SL tablet PLACE 1 TABLET (0.4 MG TOTAL) UNDER THE TONGUE EVERY 5 (FIVE) MINUTES AS NEEDED FOR CHEST PAIN. 25 tablet 3  . omeprazole (PRILOSEC) 40 MG capsule Take 40 mg by mouth daily.     . promethazine (PHENERGAN) 25 MG tablet Take 25 mg by mouth every 4 (four) hours as needed for nausea or vomiting.     . ranitidine (ZANTAC) 150 MG tablet Take 150 mg by mouth 2 (two) times daily.  11  . topiramate (TOPAMAX) 100 MG tablet Take 200 mg by mouth 2 (two) times daily.    . traZODone (DESYREL) 50 MG tablet Take 50 mg by mouth at bedtime.    Marland Kitchen trimethoprim-polymyxin b (POLYTRIM) ophthalmic solution Place 1 drop into both eyes 4 (four) times daily. For 5 days (Patient not taking: Reported on 06/01/2016) 10 mL 0  . venlafaxine (EFFEXOR) 75 MG tablet Take 75 mg by mouth 2 (two) times daily.    . verapamil (CALAN) 120 MG tablet Take 120 mg by mouth 3 (three) times daily.    . Vitamin D, Ergocalciferol, (DRISDOL) 50000 units CAPS capsule Take 50000 units once weekly on monday  0   No current facility-administered medications on file prior to visit.     Allergies  Allergen Reactions  . Amantadines Swelling  . Amitriptyline Swelling  . Amlodipine Swelling  . Aspirin Nausea Only    Irritates stomach also/hx of stomach ulcers  . Latex Hives, Itching and Swelling    Burning also  . Simvastatin Swelling    Muscle pains also  . Tramadol Hives    There were no vitals taken for this visit.   Assessment/Plan: Hypertension:    Thank you, Lelan Pons. Patterson Hammers[place], [facility]  06/08/2016 7:57  AM

## 2016-06-12 ENCOUNTER — Ambulatory Visit: Payer: Medicare Other | Attending: Family Medicine | Admitting: Physical Therapy

## 2016-06-12 DIAGNOSIS — M545 Low back pain, unspecified: Secondary | ICD-10-CM

## 2016-06-12 DIAGNOSIS — R262 Difficulty in walking, not elsewhere classified: Secondary | ICD-10-CM

## 2016-06-12 DIAGNOSIS — G8929 Other chronic pain: Secondary | ICD-10-CM | POA: Diagnosis present

## 2016-06-12 DIAGNOSIS — M62830 Muscle spasm of back: Secondary | ICD-10-CM | POA: Insufficient documentation

## 2016-06-12 NOTE — Therapy (Signed)
[place], [place], [address] Phone: [phone]   Fax:  [phone]  Physical Therapy Treatment  Patient Details  Name: [name] MRN: [id number] Date of Birth: Dec 31, 1965 Referring Provider: Dr Rodell Perna   Encounter Date: 06/12/2016      PT End of Session - 06/12/16 1155    Visit Number 4   Number of Visits 16   Date for PT Re-Evaluation 07/07/16   Authorization Type Medicare   PT Start Time 1148   PT Stop Time 1243   PT Time Calculation (min) 55 min   Activity Tolerance Patient tolerated treatment well   Behavior During Therapy [facility] for tasks assessed/performed      Past Medical History:  Diagnosis Date  . Asthma   . Biliary dyskinesia    a. s/p cholecystectomy.  . 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 Abnl stress [place] 2008, f/u cath reportedly nl. b. ETT-Myoview 04/2011 - EKG changes but normal perfusion. Cor CT - no coronary calcium, no definite stenosis though mRCA not fully evaluated. c. 10/2011 - tn elevated in [place], LHC without CAD. Started on Ranexa, anti-anginals ?microvascular dz but later stopped while in hospital on abx.  . Complication of anesthesia    hard to wake up-had to be reminded to breath  . GERD (gastroesophageal reflux disease)    a. Severe.  Marland Kitchen HTN (hypertension)   . Hx of cardiovascular stress test    Lex Myoview 8/14:  Normal, EF 74%  . Hx of echocardiogram    Echo 3/16:  Mild LVH, EF 55-60%, Gr 1 DD, trivial MR, mild LAE, normal RVF  . Hypothyroid   . MI (myocardial infarction)   . Migraine   . MRSA infection    a. After vagal nerve stimulator at [facility] - surgical site MRSA infection, PICC placed.  . Obesity   . Palpitations    a. 08/2014: 48 hour holter with 2 PVCs otherwise normal.  . Seizure disorder ([place])    a. since childhood. b. s/p vagal nerve stimulator at [facility].  . Seizures ([place])     Past Surgical History:  Procedure  Laterality Date  . [place] N/A 06/01/2016   Procedure: Right Heart Cath;  Surgeon: Larey Dresser, MD;  Location: [place] CV [facility];  Service: Cardiovascular;  Laterality: N/A;  . CESAREAN SECTION     placement of vagal nerve stimulator.  . CHOLECYSTECTOMY N/A 01/24/2013   Procedure: LAPAROSCOPIC CHOLECYSTECTOMY;  Surgeon: Harl Bowie, MD;  Location: [place];  Service: General;  Laterality: N/A;  . IMPLANTATION VAGAL NERVE STIMULATOR  [phone]   [facility]    There were no vitals filed for this visit.      Subjective Assessment - 06/12/16 1156    Subjective Patient has not been seen since 12/20. She reports that the spasms in her back have been increaseing. The pain and sitiffness is so bad today she feels like she is having some difficulty walking.    Limitations Standing;Walking   How long can you sit comfortably? > 1 hour    How long can you stand comfortably? < 10 min    How long can you walk comfortably? < 300' without increased pain    Diagnostic tests x-ray: no ankle fx    Patient Stated Goals to have less pain when perfroming activity  Currently in Pain? Yes   Pain Score 10-Worst pain ever   Pain Location Back   Pain Orientation Right;Left   Pain Descriptors / Indicators Aching   Pain Type Chronic pain   Pain Onset Today   Pain Frequency Occasional   Aggravating Factors  movement    Pain Relieving Factors rest    Effect of Pain on Daily Activities difficulty perfroming ADL's    Multiple Pain Sites Yes   Pain Score 5   Pain Location Ankle   Pain Orientation Right   Pain Descriptors / Indicators Aching   Pain Type Chronic pain;Neuropathic pain   Pain Onset More than a month ago   Pain Frequency Constant   Aggravating Factors  standing, walking    Pain Relieving Factors rest    Effect of Pain on Daily Activities difficulty perfromin ADL"s                           [facility] Adult PT Treatment/Exercise - 06/12/16 0001      Lumbar Exercises: Stretches   Single Knee to Chest Stretch Limitations 2x20sec with towel on the right    Lower Trunk Rotation Limitations x10 in low range    Piriformis Stretch Limitations with towell 3x20 seconds each side      Lumbar Exercises: Supine   Clam Limitations yellow 2x10    Other Supine Lumbar Exercises Abdominal breathing x10 ; ball squeeze 2x10;      Modalities   Modalities Electrical Stimulation     Electrical Stimulation   Electrical Stimulation Location lower back    Electrical Stimulation Action IFC    Electrical Stimulation Parameters to tolerance    Electrical Stimulation Goals Pain     Manual Therapy   Manual therapy comments  PA glide on the right; trigger point release to bilateral lumbar spine, Posterior glide on the right to improve right hip flexion                PT Education - 06/12/16 1200    Education provided Yes   Education Details contiue with sttretches    Person(s) Educated Patient   Methods Explanation;Demonstration   Comprehension Verbalized understanding;Returned demonstration          PT Short Term Goals - 06/12/16 1652      PT SHORT TERM GOAL #1   Title Patient will increase lumbar flexion and extension by 25%    Time 4   Period Weeks   Status On-going     PT SHORT TERM GOAL #2   Title Patient will increase gross right ankle strength to 4/5 for ER/IR and DF    Baseline added DF resistance   Time 4   Period Weeks   Status On-going     PT SHORT TERM GOAL #3   Title Patient will demsotrate a good core contraction    Time 4   Period Weeks   Status On-going     PT SHORT TERM GOAL #4   Title Patient will be independent with initial HEP    Time 4   Period Weeks   Status On-going           PT Long Term Goals - 05/15/16 1009      PT LONG TERM GOAL #1   Title Patient will stand for 1 hour without report of ankle  and back pain    Time 4   Period Weeks   Status New  PT LONG TERM GOAL #2   Title Patient will go up/down 8 steps without increased pain    Time 4   Period Weeks   Status New     PT LONG TERM GOAL #3   Title Patient will ambualte 3000' without increased pain in order to go shopping   Time 4   Status New     PT LONG TERM GOAL #4   Title Patient will return to gym with a program to improve core and lower extremity strength    Time 4   Period Weeks   Status New               Plan - 06/12/16 1201    Clinical Impression Statement Patient encouraged to come to therapy more constinetly. She reported decreased stiffness after treatment. Patient reported improved pain after e-stim. Therapy ofcused on her lower back 2nd to that being her biggest problem today. Next visit therapy will work on her ankle as well.    Clinical Impairments Affecting Rehab Potential long standing lower back pain    PT Frequency 2x / week   PT Duration 8 weeks   PT Treatment/Interventions ADLs/Self Care Home Management;Cryotherapy;Electrical Stimulation;Gait training;Stair training;Ultrasound;Traction;Iontophoresis 4mg /ml Dexamethasone;Moist Heat;Therapeutic activities;Therapeutic exercise;Neuromuscular re-education;Patient/family education;Manual techniques;Taping;Splinting;Energy conservation;Dry needling;Passive range of motion   PT Next Visit Plan consider maual therapy to increase hip motion, Continue with HEP; Consider light core strengthening clam shell, ball squeeze, Continue to perfrom manual DF ROM; add light resistance to ankle 4 way, patient is alergic to latex though.     PT Home Exercise Plan LTR, abdominal breathing, single knee to chest stretch, ankle    Consulted and Agree with Plan of Care Patient      Patient will benefit from skilled therapeutic intervention in order to improve the following deficits and impairments:  Abnormal gait, Decreased strength, Decreased mobility, Pain,  Increased muscle spasms, Decreased endurance, Decreased safety awareness, Decreased activity tolerance  Visit Diagnosis: Chronic bilateral low back pain without sciatica  Muscle spasm of back  Difficulty in walking, not elsewhere classified     Problem List Patient Active Problem List   Diagnosis Date Noted  . Takotsubo cardiomyopathy 05/24/2016  . Midline low back pain without sciatica 04/19/2016  . Abnormal weight gain 06/11/2015  . BMI 40.0-44.9, adult ([place]) 06/11/2015  . Spells 01/03/2015  . Asthma 08/24/2014  . Chronic diastolic heart failure 99991111  . Allergic rhinitis 05/04/2014  . Severe persistent asthma 05/04/2014  . Pharyngoesophageal dysphagia 01/30/2014  . Chest pain syndrome 07/02/2013  . Hypothyroidism 07/02/2013  . Hypothyroid   . Chronic chest pain   . GERD (gastroesophageal reflux disease) 02/10/2013  . Biliary dyskinesia 12/30/2012  . Exertional dyspnea 09/19/2012  . Status post VNS (vagus nerve stimulator) placement 06/10/2012  . Refusal of blood transfusions as patient is Jehovah's Witness 05/20/2012  . Dyslipidemia 12/29/2011  . 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 Dyslipidemia, goal LDL below 70 05/17/2011  . Non-cardiac chest pain secondary to GERD 04/24/2011  . HTN, goal below 130/80 04/24/2011    Carney Living PT [name]  06/12/2016, 4:54 PM  [facility] [address] [place], [place], [address] Phone: [phone]   Fax:  [phone]  Name: [name] MRN: [id number] Date of Birth: 1965/07/09

## 2016-06-13 DIAGNOSIS — G8929 Other chronic pain: Secondary | ICD-10-CM | POA: Diagnosis not present

## 2016-06-13 DIAGNOSIS — M62830 Muscle spasm of back: Secondary | ICD-10-CM | POA: Diagnosis not present

## 2016-06-13 DIAGNOSIS — R262 Difficulty in walking, not elsewhere classified: Secondary | ICD-10-CM | POA: Diagnosis not present

## 2016-06-13 DIAGNOSIS — M545 Low back pain: Secondary | ICD-10-CM | POA: Diagnosis not present

## 2016-06-14 ENCOUNTER — Ambulatory Visit (HOSPITAL_COMMUNITY)
Admission: RE | Admit: 2016-06-14 | Discharge: 2016-06-14 | Disposition: A | Discharge status: Home / Self Care | Payer: Medicare Other | Source: Ambulatory Visit | Attending: Surgery | Admitting: Surgery

## 2016-06-14 DIAGNOSIS — G8929 Other chronic pain: Secondary | ICD-10-CM | POA: Diagnosis not present

## 2016-06-14 DIAGNOSIS — M545 Low back pain, unspecified: Secondary | ICD-10-CM

## 2016-06-14 DIAGNOSIS — M47896 Other spondylosis, lumbar region: Secondary | ICD-10-CM | POA: Insufficient documentation

## 2016-06-14 DIAGNOSIS — M5126 Other intervertebral disc displacement, lumbar region: Secondary | ICD-10-CM | POA: Diagnosis not present

## 2016-06-16 ENCOUNTER — Ambulatory Visit: Payer: Medicare Other | Admitting: Physician Assistant

## 2016-06-16 ENCOUNTER — Encounter: Payer: Self-pay | Admitting: Physician Assistant

## 2016-06-16 NOTE — Progress Notes (Deleted)
Cardiology Office Note:    Date:  06/16/2016   ID:  [name], DOB 12-13-1965, MRN [id number]  PCP:  Mackie Pai, MD in [place]; Dr. Criss Rosales in [place]   Cardiologist:  Dr. Loralie Champagne   Electrophysiologist:  n/a  Referring MD: Mackie Pai, MD   No chief complaint on file. ***  History of Present Illness:    [name] is a 51 y.o. female with a hx of chest pain and prior neg cardiac catheterizations, migraine HAs, seizure d/o, Asthma, HTN.  Her hx includes chronic chest pain for years.  She had an abnormal stress test in [place] in 2008.  She was then sent for a cardiac cath, which she says was normal.  We had her do an ETT-myoview in this office in 11/12.  This showed ischemic-type ST depression on exercise ECG, but there was no evidence for ischemia on myoview images.  EF was 66%.  Given ongoing chest pain and ischemic ECG changes with normal perfusion images (equivocal study), she underwent coronary CTA.  This was a difficult study (HR was elevated) but she had no coronary calcium and no definite stenosis (althought mRCA was not fully evaluated due to artifact).  Overall, it was felt to be a neg study.  She then had a cardiac cath in [place] in 5/13 given ongoing chest pain and a mildly elevated Troponin that showed no angiographic CAD.  It was felt she may have microvascular angina and she was tx with nitrates, amlodipine and Ranolazine (amlodipine was DC'd 2/2 edema).  The Ranolazine was DC'd in 12/13 at [facility] while receiving antibiotics for MRSA infection after her vagal nerve stimulator was replaced.  She did not notice much difference in her symptoms with stopping this medication.  Nuclear stress test in 10/17 demonstrated normal perfusion and EF 49.  EF was 60-65 on Echo.    Last seen by Dr. Loralie Champagne on 05/24/16.  She continued to complains of chest pain/tightness on most days (not exertional).  She did note dyspnea on exertion after walking ~ 50 yards.  She  is on Lasix and this was increased by her PCP prior to her appt.  Her exam was felt to be difficult to assess volume and RHC was recommended.  This demonstrated normal R and L heart filling pressures and no pulmonary HTN.  She returns for follow up.  ***   Prior CV studies that were reviewed today include:    R Heart Cath 06/01/16 RA mean 7 RV 35/8 PA 31/12, mean 20 PCWP mean 10 Oxygen saturations: PA 66% AO 98% Cardiac Output (Fick) 5.88  Cardiac Index (Fick) 2.59 Conclusion: Normal right and left heart filling pressures, no pulmonary hypertension.  Normal cardiac output.   Echo 04/04/16 Mild LVH, EF 60-65, Gr 2 DD, trivial PI  Myoview 04/04/16 EF 49, no ischemia; Low Risk  Echo 3/16:   Mild LVH, EF 55-60%, Gr 1 DD, trivial MR, mild LAE, normal RVF  Myoview 02/17/14 [facility]) Lexiscan - No ischemia, EF 70%  Echo 4/14 EF 55-65, mild LVH, Gr 2 DD, trivial AI  Coronary CTA 11/12 IMPRESSION: 1. No plaque or stenosis in the visualized portion of the coronaries.  This was a difficult study due to persistently elevated heart rate.  The mid RCA was not visualized, probably due to gating artifact. 2. Coronary calcium score was 0.  LHC 5/13 [facility]) Normal coronary arteries, normal LVEDP  Past Medical History:  Diagnosis Date  . Asthma   .  Biliary dyskinesia    a. s/p cholecystectomy.  . 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 Abnl stress [place] 2008, f/u cath reportedly nl. b. ETT-Myoview 04/2011 - EKG changes but normal perfusion. Cor CT - no coronary calcium, no definite stenosis though mRCA not fully evaluated. c. 10/2011 - tn elevated in [place], LHC without CAD. Started on Ranexa, anti-anginals ?microvascular dz but later stopped while in hospital on abx.  . Complication of anesthesia    hard to wake up-had to be reminded to breath  . GERD (gastroesophageal reflux disease)    a. Severe.  Marland Kitchen HTN (hypertension)   . Hx of cardiovascular stress test     Lex Myoview 8/14:  Normal, EF 74%  . Hx of echocardiogram    Echo 3/16:  Mild LVH, EF 55-60%, Gr 1 DD, trivial MR, mild LAE, normal RVF  . Hypothyroid   . MI (myocardial infarction)   . Migraine   . MRSA infection    a. After vagal nerve stimulator at [facility] - surgical site MRSA infection, PICC placed.  . Obesity   . Palpitations    a. 08/2014: 48 hour holter with 2 PVCs otherwise normal.  . Seizure disorder ([place])    a. since childhood. b. s/p vagal nerve stimulator at [facility].  . Seizures ([place])   1. Chest pain: Multiple episodes over the years.  She had an abnormal stress test in 2008 in [place] and it sounds like she went to [facility] for a cardiac catheterization which per her report was normal.   Stress echo at [facility] in 2010 was submaximal but showed no evidence for ischemia.  ETT-myoview (11/12): 7'15", EF 66%, significant ST depression on exercise ECG, apical thinning on myoview images but no evidence for ischemia. Coronary CT angiogram (11/12): calcium score 0, difficult study with gating artifact, mid RCA nonevaluatable but no stenosis elsewhere in the coronaries.   Echo (11/12): EF 55-60%, mild LV hypertrophy, mild MR.  NSTEMI in [place] in 5/13 with elevated troponin but LHC showed normal coronaries.  Echo (5/13) with EF 60-65%, normal valve, no LVH.  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  Cardiolite (8/14) with EF 74%, no ischemia or infarction.  Echo (3/16) with EF 55-60%, mild LVH, normal RV size and systolic function.  - Cardiolite (10/17) with EF 49%, no ischemia/infarction.  - Echo (10/17) with EF 60-65%, grade II diastolic dysfunction.  2. Migraines: Uses triptan medications.  3. HTN: edema with amlodipine.  4. Seizure disorder: Has vagus nerve stimulator.  5. Hypothyroidism 6. Asthma since childhood 7. PVCs: Documented by holter in 3/16.    Past Surgical History:  Procedure Laterality Date  . [place] N/A 06/01/2016   Procedure: Right Heart Cath;  Surgeon: Larey Dresser, MD;  Location: [place] CV LAB;  Service: Cardiovascular;  Laterality: N/A;  . CESAREAN SECTION     placement of vagal nerve stimulator.  . CHOLECYSTECTOMY N/A 01/24/2013   Procedure: LAPAROSCOPIC CHOLECYSTECTOMY;  Surgeon: Harl Bowie, MD;  Location: [place];  Service: General;  Laterality: N/A;  . IMPLANTATION VAGAL NERVE STIMULATOR  [phone]   [facility]attery [facility]    Current Medications: No outpatient prescriptions have been marked as taking for the 06/16/16 encounter (Appointment) with Liliane Shi, PA-C.     Allergies:   Amantadines; Amitriptyline; Amlodipine; Aspirin; Latex; Simvastatin; and Tramadol   Social History  Social History  . Marital status: Single    Spouse name: N/A  . Number of children: 2  . Years of education: N/A   Social History Main Topics  . Smoking status: Never Smoker  . Smokeless tobacco: Never Used  . Alcohol use 0.0 oz/week    1 - 3 Glasses of wine per week     Comment: occasional  . Drug use: No  . Sexual activity: Yes    Birth control/ protection: None   Other Topics Concern  . Not on file   Social History Narrative   Lives alone     Family History:  The patient's ***family history includes Cancer in her other; Coronary artery disease in her maternal grandmother; Coronary artery disease (age of onset: 46) in her mother; Hypertension in her other; Stroke in her other.   ROS:   Please see the history of present illness.    ROS All other systems reviewed and are negative.   EKGs/Labs/Other Test Reviewed:    EKG:  EKG is *** ordered today.  The ekg ordered today demonstrates ***  Recent Labs: 03/06/2016: Brain Natriuretic Peptide <4.0 06/01/2016: BUN 9; Creatinine, Ser 0.72; Hemoglobin 11.8; Platelets 229; Potassium 3.6; Sodium 139   Recent Lipid Panel    Component Value Date/Time   CHOL 166  09/30/2012 0929   TRIG 56.0 09/30/2012 0929   HDL 51.30 09/30/2012 0929   CHOLHDL 3 09/30/2012 0929   VLDL 11.2 09/30/2012 0929   LDLCALC 104 (H) 09/30/2012 0929     Physical Exam:    VS:  LMP 06/02/2016     Wt Readings from Last 3 Encounters:  06/01/16 275 lb (124.7 kg)  05/24/16 280 lb (127 kg)  04/19/16 265 lb (120.2 kg)     ***Physical Exam  ASSESSMENT:    No diagnosis found. PLAN:    In order of problems listed above:  1. Chest pain - Chronic with multiple evaluations in the past.  It has been questioned if she has microvascular angina in the past.  ***f 2. Dyspnea - Recent RHC with normal R and L heart filling pressures and no pulmonary HTN.  *** 3. Chronic diastolic CHF - Prior echocardiograms have shown mod diastolic dysfunction.  She is on Lasix.  *** 4. HTN - At last visit with Dr. Loralie Champagne, beta-blocker changed.   *** 5. Asthma - ***   Medication Adjustments/Labs and Tests Ordered: Current medicines are reviewed at length with the patient today.  Concerns regarding medicines are outlined above.  Medication changes, Labs and Tests ordered today are outlined in the Patient Instructions noted below. There are no Patient Instructions on file for this visit. Signed, Richardson Dopp, PA-C  06/16/2016 7:50 AM    [facility] [address], [place], [place]  [address] Phone: [phone]; Fax: [phone]

## 2016-06-20 ENCOUNTER — Ambulatory Visit: Payer: Medicare Other | Admitting: Physical Therapy

## 2016-06-20 ENCOUNTER — Encounter: Payer: Self-pay | Admitting: Physical Therapy

## 2016-06-20 DIAGNOSIS — M545 Low back pain, unspecified: Secondary | ICD-10-CM

## 2016-06-20 DIAGNOSIS — M62830 Muscle spasm of back: Secondary | ICD-10-CM

## 2016-06-20 DIAGNOSIS — G8929 Other chronic pain: Secondary | ICD-10-CM

## 2016-06-20 DIAGNOSIS — R262 Difficulty in walking, not elsewhere classified: Secondary | ICD-10-CM

## 2016-06-20 NOTE — Therapy (Signed)
[place], [place], [address] Phone: [phone]   Fax:  [phone]  Physical Therapy Treatment  Patient Details  Name: [name] MRN: [id number] Date of Birth: May 29, 1966 Referring Provider: Dr Rodell Perna   Encounter Date: 06/20/2016      PT End of Session - 06/20/16 1041    Visit Number 5   Number of Visits 16   Date for PT Re-Evaluation 07/07/16   Authorization Type Medicare   PT Start Time [id number]   PT Stop Time 1114   PT Time Calculation (min) 59 min   Activity Tolerance Patient tolerated treatment well   Behavior During Therapy [facility] for tasks assessed/performed      Past Medical History:  Diagnosis Date  . Asthma   . Biliary dyskinesia    a. s/p cholecystectomy.  . 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 Abnl stress [place] 2008, f/u cath reportedly nl. b. ETT-Myoview 04/2011 - EKG changes but normal perfusion. Cor CT - no coronary calcium, no definite stenosis though mRCA not fully evaluated. c. 10/2011 - tn elevated in [place], LHC without CAD. Started on Ranexa, anti-anginals ?microvascular dz but later stopped while in hospital on abx.  . Complication of anesthesia    hard to wake up-had to be reminded to breath  . GERD (gastroesophageal reflux disease)    a. Severe.  Marland Kitchen HTN (hypertension)   . Hx of cardiovascular stress test    Lex Myoview 8/14:  Normal, EF 74%  . Hx of echocardiogram    Echo 3/16:  Mild LVH, EF 55-60%, Gr 1 DD, trivial MR, mild LAE, normal RVF  . Hypothyroid   . MI (myocardial infarction)   . Migraine   . MRSA infection    a. After vagal nerve stimulator at [facility] - surgical site MRSA infection, PICC placed.  . Obesity   . Palpitations    a. 08/2014: 48 hour holter with 2 PVCs otherwise normal.  . Seizure disorder ([place])    a. since childhood. b. s/p vagal nerve stimulator at [facility].  . Seizures ([place])     Past Surgical History:  Procedure  Laterality Date  . [place] N/A 06/01/2016   Procedure: Right Heart Cath;  Surgeon: Larey Dresser, MD;  Location: [place] CV LAB;  Service: Cardiovascular;  Laterality: N/A;  . CESAREAN SECTION     placement of vagal nerve stimulator.  . CHOLECYSTECTOMY N/A 01/24/2013   Procedure: LAPAROSCOPIC CHOLECYSTECTOMY;  Surgeon: Harl Bowie, MD;  Location: [place];  Service: General;  Laterality: N/A;  . IMPLANTATION VAGAL NERVE STIMULATOR  [phone]   [facility]    There were no vitals filed for this visit.      Subjective Assessment - 06/20/16 1025    Subjective Patient reports her back has been feeling a little better. She is having less pain at this time.    Limitations Standing;Walking   How long can you sit comfortably? > 1 hour    How long can you stand comfortably? < 10 min    How long can you walk comfortably? < 300' without increased pain    Diagnostic tests x-ray: no ankle fx    Patient Stated Goals to have less pain when perfroming activity    Pain Score 5    Pain Location Back   Pain Orientation Right;Left  Pain Descriptors / Indicators Aching   Pain Type Chronic pain   Pain Onset Today   Pain Frequency Occasional   Aggravating Factors  movement    Pain Relieving Factors rest    Effect of Pain on Daily Activities difficulty perfroming ADL's    Multiple Pain Sites No   Pain Location Ankle   Pain Orientation Right   Pain Descriptors / Indicators Aching   Pain Type Chronic pain;Neuropathic pain   Pain Onset More than a month ago   Aggravating Factors  standing, walking    Pain Relieving Factors rest    Effect of Pain on Daily Activities difficulty perfroming                          [facility] Adult PT Treatment/Exercise - 06/20/16 0001      Lumbar Exercises: Stretches   Single Knee to Chest Stretch Limitations 2x20sec with towel on the right     Lower Trunk Rotation Limitations x10 in low range    Piriformis Stretch Limitations with towell 3x20 seconds each side      Lumbar Exercises: Supine   Clam Limitations yellow 2x10    Other Supine Lumbar Exercises Abdominal breathing x10 ; ball squeeze 2x10;      Modalities   Modalities Electrical Stimulation     Electrical Stimulation   Electrical Stimulation Location lower back    Electrical Stimulation Action IFC   Electrical Stimulation Parameters to tolerance    Electrical Stimulation Goals Pain     Manual Therapy   Manual therapy comments  PA glide on the right hip; trigger point release to bilateral lumbar spine, Posterior glide on the right to improve right hip flexion; Ankle DF stretch; ankle anterior draw glide.      Ankle Exercises: Supine   Other Supine Ankle Exercises Ankle 4 way movement 2x10 each yellow for PF/DF ; DF stretch with strap 3x15 sec hold                 PT Education - 06/20/16 1040    Education provided Yes   Education Details continue with stretches    Person(s) Educated Patient   Methods Explanation;Demonstration   Comprehension Verbalized understanding;Returned demonstration          PT Short Term Goals - 06/12/16 1652      PT SHORT TERM GOAL #1   Title Patient will increase lumbar flexion and extension by 25%    Time 4   Period Weeks   Status On-going     PT SHORT TERM GOAL #2   Title Patient will increase gross right ankle strength to 4/5 for ER/IR and DF    Baseline added DF resistance   Time 4   Period Weeks   Status On-going     PT SHORT TERM GOAL #3   Title Patient will demsotrate a good core contraction    Time 4   Period Weeks   Status On-going     PT SHORT TERM GOAL #4   Title Patient will be independent with initial HEP    Time 4   Period Weeks   Status On-going           PT Long Term Goals - 05/15/16 1009      PT LONG TERM GOAL #1   Title Patient will stand for 1 hour without report of ankle and  back pain    Time 4   Period Weeks   Status  New     PT LONG TERM GOAL #2   Title Patient will go up/down 8 steps without increased pain    Time 4   Period Weeks   Status New     PT LONG TERM GOAL #3   Title Patient will ambualte 3000' without increased pain in order to go shopping   Time 4   Status New     PT LONG TERM GOAL #4   Title Patient will return to gym with a program to improve core and lower extremity strength    Time 4   Period Weeks   Status New               Plan - 06/20/16 1043    Clinical Impression Statement Patient tolerated increased activity today but she is still haveing pain. therapy worked on her ankel to increase dorsi flexion and to decreasde pain.    Rehab Potential Fair   Clinical Impairments Affecting Rehab Potential long standing lower back pain    PT Frequency 2x / week   PT Duration 8 weeks   PT Treatment/Interventions ADLs/Self Care Home Management;Cryotherapy;Electrical Stimulation;Gait training;Stair training;Ultrasound;Traction;Iontophoresis 4mg /ml Dexamethasone;Moist Heat;Therapeutic activities;Therapeutic exercise;Neuromuscular re-education;Patient/family education;Manual techniques;Taping;Splinting;Energy conservation;Dry needling;Passive range of motion   PT Next Visit Plan consider maual therapy to increase hip motion, Continue with HEP; Consider light core strengthening clam shell, ball squeeze, Continue to perfrom manual DF ROM; add light resistance to ankle 4 way, patient is alergic to latex though.     PT Home Exercise Plan LTR, abdominal breathing, single knee to chest stretch, ankle    Consulted and Agree with Plan of Care Patient      Patient will benefit from skilled therapeutic intervention in order to improve the following deficits and impairments:  Abnormal gait, Decreased strength, Decreased mobility, Pain, Increased muscle spasms, Decreased endurance, Decreased safety awareness, Decreased activity tolerance  Visit  Diagnosis: Chronic bilateral low back pain without sciatica  Muscle spasm of back  Difficulty in walking, not elsewhere classified     Problem List Patient Active Problem List   Diagnosis Date Noted  . Midline low back pain without sciatica 04/19/2016  . Abnormal weight gain 06/11/2015  . BMI 40.0-44.9, adult ([place]) 06/11/2015  . Spells 01/03/2015  . Asthma 08/24/2014  . Chronic diastolic heart failure [id number]  . Allergic rhinitis 05/04/2014  . Severe persistent asthma 05/04/2014  . Pharyngoesophageal dysphagia 01/30/2014  . Chest pain syndrome 07/02/2013  . Hypothyroidism 07/02/2013  . Hypothyroid   . Chronic chest pain   . GERD (gastroesophageal reflux disease) 02/10/2013  . Biliary dyskinesia 12/30/2012  . Exertional dyspnea 09/19/2012  . Status post VNS (vagus nerve stimulator) placement 06/10/2012  . Refusal of blood transfusions as patient is Jehovah's Witness 05/20/2012  . Dyslipidemia 12/29/2011  . 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 Dyslipidemia, goal LDL below 70 05/17/2011  . Non-cardiac chest pain secondary to GERD 04/24/2011  . HTN, goal below 130/80 04/24/2011    Carney Living PT [name]  06/20/2016, 8:16 PM  [facility] [address] [place], [place], [address] Phone: [phone]   Fax:  [phone]  Name: [name] MRN: [id number] Date of Birth: 07-Feb-1966

## 2016-06-22 ENCOUNTER — Ambulatory Visit: Payer: Medicare Other | Admitting: Physical Therapy

## 2016-06-23 ENCOUNTER — Encounter (HOSPITAL_COMMUNITY): Payer: Self-pay

## 2016-06-23 ENCOUNTER — Emergency Department (HOSPITAL_COMMUNITY)
Admission: EM | Admit: 2016-06-23 | Discharge: 2016-06-23 | Disposition: A | Discharge status: Home / Self Care | Payer: Medicare Other | Attending: Emergency Medicine | Admitting: Emergency Medicine

## 2016-06-23 DIAGNOSIS — J45909 Unspecified asthma, uncomplicated: Secondary | ICD-10-CM | POA: Diagnosis not present

## 2016-06-23 DIAGNOSIS — I252 Old myocardial infarction: Secondary | ICD-10-CM | POA: Diagnosis not present

## 2016-06-23 DIAGNOSIS — I110 Hypertensive heart disease with heart failure: Secondary | ICD-10-CM | POA: Diagnosis not present

## 2016-06-23 DIAGNOSIS — G43909 Migraine, unspecified, not intractable, without status migrainosus: Secondary | ICD-10-CM | POA: Diagnosis present

## 2016-06-23 DIAGNOSIS — E039 Hypothyroidism, unspecified: Secondary | ICD-10-CM | POA: Insufficient documentation

## 2016-06-23 DIAGNOSIS — I5032 Chronic diastolic (congestive) heart failure: Secondary | ICD-10-CM | POA: Diagnosis not present

## 2016-06-23 DIAGNOSIS — Z91040 Latex allergy status: Secondary | ICD-10-CM | POA: Diagnosis not present

## 2016-06-23 DIAGNOSIS — G43809 Other migraine, not intractable, without status migrainosus: Secondary | ICD-10-CM | POA: Insufficient documentation

## 2016-06-23 LAB — URINALYSIS, ROUTINE W REFLEX MICROSCOPIC
Bilirubin Urine: NEGATIVE
Glucose, UA: NEGATIVE mg/dL
Ketones, ur: NEGATIVE mg/dL
Leukocytes, UA: NEGATIVE
Nitrite: NEGATIVE
Protein, ur: NEGATIVE mg/dL
Specific Gravity, Urine: 1.017 (ref 1.005–1.030)
pH: 6 (ref 5.0–8.0)

## 2016-06-23 MED ORDER — DEXAMETHASONE SODIUM PHOSPHATE 10 MG/ML IJ SOLN
10.0000 mg | Freq: Once | INTRAMUSCULAR | Status: AC
  Administered 2016-06-23: 10 mg via INTRAVENOUS
  Filled 2016-06-23: qty 1

## 2016-06-23 MED ORDER — ONDANSETRON 4 MG PO TBDP
4.0000 mg | ORAL_TABLET | Freq: Once | ORAL | Status: AC
  Administered 2016-06-23: 4 mg via ORAL

## 2016-06-23 MED ORDER — ONDANSETRON HCL 4 MG PO TABS: 4.0000 mg | ORAL_TABLET | Freq: Four times a day (QID) | ORAL | 0 refills | Status: DC

## 2016-06-23 MED ORDER — DIPHENHYDRAMINE HCL 50 MG/ML IJ SOLN
25.0000 mg | Freq: Once | INTRAMUSCULAR | Status: AC
  Administered 2016-06-23: 25 mg via INTRAVENOUS
  Filled 2016-06-23: qty 1

## 2016-06-23 MED ORDER — ONDANSETRON HCL 4 MG/2ML IJ SOLN
4.0000 mg | Freq: Once | INTRAMUSCULAR | Status: AC
  Administered 2016-06-23: 4 mg via INTRAVENOUS
  Filled 2016-06-23: qty 2

## 2016-06-23 MED ORDER — METOCLOPRAMIDE HCL 5 MG/ML IJ SOLN
10.0000 mg | Freq: Once | INTRAMUSCULAR | Status: AC
  Administered 2016-06-23: 10 mg via INTRAVENOUS
  Filled 2016-06-23: qty 2

## 2016-06-23 MED ORDER — SODIUM CHLORIDE 0.9 % IV BOLUS (SEPSIS)
1000.0000 mL | Freq: Once | INTRAVENOUS | Status: AC
  Administered 2016-06-23: 1000 mL via INTRAVENOUS

## 2016-06-23 MED ORDER — KETOROLAC TROMETHAMINE 30 MG/ML IJ SOLN
30.0000 mg | Freq: Once | INTRAMUSCULAR | Status: AC
  Administered 2016-06-23: 30 mg via INTRAVENOUS
  Filled 2016-06-23: qty 1

## 2016-06-23 MED ORDER — HYDROMORPHONE HCL 2 MG/ML IJ SOLN
1.0000 mg | Freq: Once | INTRAMUSCULAR | Status: AC
  Administered 2016-06-23: 1 mg via INTRAVENOUS
  Filled 2016-06-23: qty 1

## 2016-06-23 MED ORDER — ONDANSETRON 4 MG PO TBDP
ORAL_TABLET | ORAL | Status: AC
  Filled 2016-06-23: qty 1

## 2016-06-23 NOTE — ED Triage Notes (Addendum)
Pt. Has a hx of migraine.  Developed one yesterday, OTC medication not helping. Photophobia , n/v present.  Skin is warm and dry.  Pt. Is alert and oriented X 4.  Warm blanket given in triage

## 2016-06-23 NOTE — ED Provider Notes (Signed)
[facility] DEPT Provider Note   CSN: [id number] Arrival date & time: 06/23/16  [id number]     History   Chief Complaint Chief Complaint  Patient presents with  . Migraine    HPI [name] is a 51 y.o. female.  HPI  The patient has a past medical history of MI, migraines, seizures, obesity, acid reflux, asthma, chronic chest pain comes to the emergency department with complaints of a migraine. Headache started yesterday and over-the-counter medication has not been helping. It is a global headache that feels like her usual migraine but a little bit worse than usual. She takes Topamax, took her BP medication, fluids pills, Tylenol and Naprosyn without any relief. Describes it as squeezing and a pain of 10/10.  She has had associated nausea, vomiting and photophobia. She has not had any fever, neck pain, confusion, weakness.  Past Medical History:  Diagnosis Date  . Asthma   . Biliary dyskinesia    a. s/p cholecystectomy.  . 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 Abnl stress [place] 2008, f/u cath reportedly nl. b. ETT-Myoview 04/2011 - EKG changes but normal perfusion. Cor CT - no coronary calcium, no definite stenosis though mRCA not fully evaluated. c. 10/2011 - tn elevated in [place], LHC without CAD. Started on Ranexa, anti-anginals ?microvascular dz but later stopped while in hospital on abx.  . Complication of anesthesia    hard to wake up-had to be reminded to breath  . GERD (gastroesophageal reflux disease)    a. Severe.  Marland Kitchen HTN (hypertension)   . Hx of cardiovascular stress test    [place] Myoview 8/14:  Normal, EF 74%  . Hx of echocardiogram    Echo 3/16:  Mild LVH, EF 55-60%, Gr 1 DD, trivial MR, mild LAE, normal RVF  . Hypothyroid   . MI (myocardial infarction)   . Migraine   . MRSA infection    a. After vagal nerve stimulator at [facility] - surgical site MRSA infection, PICC placed.  . Obesity   . Palpitations    a. 08/2014: 48 hour holter with 2  PVCs otherwise normal.  . Seizure disorder ([place])    a. since childhood. b. s/p vagal nerve stimulator at [facility].  . Seizures [facility])     Patient Active Problem List   Diagnosis Date Noted  . Midline low back pain without sciatica 04/19/2016  . Abnormal weight gain 06/11/2015  . BMI 40.0-44.9, adult ([place]) 06/11/2015  . Spells 01/03/2015  . Asthma 08/24/2014  . Chronic diastolic heart failure 99991111  . Allergic rhinitis 05/04/2014  . Severe persistent asthma 05/04/2014  . Pharyngoesophageal dysphagia 01/30/2014  . Chest pain syndrome 07/02/2013  . Hypothyroidism 07/02/2013  . Hypothyroid   . Chronic chest pain   . GERD (gastroesophageal reflux disease) 02/10/2013  . Biliary dyskinesia 12/30/2012  . Exertional dyspnea 09/19/2012  . Status post VNS (vagus nerve stimulator) placement 06/10/2012  . Refusal of blood transfusions as patient is Jehovah's Witness 05/20/2012  . Dyslipidemia 12/29/2011  . 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 Dyslipidemia, goal LDL below 70 05/17/2011  . Non-cardiac chest pain secondary to GERD 04/24/2011  . HTN, goal below 130/80 04/24/2011    Past Surgical History:  Procedure Laterality Date  . [place] N/A 06/01/2016   Procedure: Right Heart Cath;  Surgeon: Larey Dresser, MD;  Location: [place] CV  LAB;  Service: Cardiovascular;  Laterality: N/A;  . CESAREAN SECTION     placement of vagal nerve stimulator.  . CHOLECYSTECTOMY N/A 01/24/2013   Procedure: LAPAROSCOPIC CHOLECYSTECTOMY;  Surgeon: Harl Bowie, MD;  Location: [place];  Service: General;  Laterality: N/A;  . IMPLANTATION VAGAL NERVE STIMULATOR  2000,2013   [facility]    OB History    No data available       Home Medications    Prior to Admission medications   Medication Sig Start Date End Date Taking? Authorizing Provider    acetaminophen (TYLENOL) 325 MG tablet Take 975 mg by mouth every 6 (six) hours as needed for headache.   Yes Historical Provider, MD  albuterol (PROAIR HFA) 108 (90 BASE) MCG/ACT inhaler Inhale 2 puffs into the lungs every 4 (four) hours as needed for wheezing or shortness of breath.    Yes Historical Provider, MD  baclofen (LIORESAL) 10 MG tablet Take 10 mg by mouth 2 (two) times daily as needed for muscle spasms.  05/16/16  Yes Historical Provider, MD  carvedilol (COREG) 12.5 MG tablet Take 1 tablet (12.5 mg total) by mouth 2 (two) times daily. 05/24/16 08/22/16 Yes Larey Dresser, MD  DULoxetine (CYMBALTA) 30 MG capsule Take 30-60 capsules by mouth 2 (two) times daily as needed (for back pain).  05/15/16  Yes Historical Provider, MD  fluticasone (FLONASE) 50 MCG/ACT nasal spray Place 1 spray into both nostrils 2 (two) times daily as needed for allergies.  04/17/14  Yes Historical Provider, MD  furosemide (LASIX) 40 MG tablet Take 40 mg by mouth daily.  05/15/16  Yes Historical Provider, MD  isosorbide mononitrate (IMDUR) 60 MG 24 hr tablet TAKE 1 TABLET (60 MG TOTAL) BY MOUTH 2 (TWO) TIMES DAILY. 11/11/14  Yes Larey Dresser, MD  levothyroxine (SYNTHROID, LEVOTHROID) 50 MCG tablet Take 50 mcg by mouth daily.    Yes Historical Provider, MD  losartan (COZAAR) 100 MG tablet Take 1 tablet (100 mg total) by mouth daily. 03/28/16  Yes Brittainy Erie Noe, PA-C  metoprolol tartrate (LOPRESSOR) 25 MG tablet Take 50 mg by mouth 2 (two) times daily.   Yes Historical Provider, MD  mometasone-formoterol (DULERA) 100-5 MCG/ACT AERO Inhale 2 puffs into the lungs 2 (two) times daily. 07/09/15  Yes Historical Provider, MD  montelukast (SINGULAIR) 10 MG tablet Take 10 mg by mouth daily as needed (for allergies).  04/17/14  Yes Historical Provider, MD  naproxen (NAPROSYN) 500 MG tablet Take 500 mg by mouth twice daily as needed for pain 04/07/16  Yes Historical Provider, MD  nitroGLYCERIN (NITRODUR - DOSED IN MG/24 HR)  0.2 mg/hr patch PLACE 1 PATCH ONTO THE SKIN DAILY. 09/08/14  Yes Larey Dresser, MD  nitroGLYCERIN (NITROSTAT) 0.4 MG SL tablet PLACE 1 TABLET (0.4 MG TOTAL) UNDER THE TONGUE EVERY 5 (FIVE) MINUTES AS NEEDED FOR CHEST PAIN. 09/28/15  Yes Larey Dresser, MD  omeprazole (PRILOSEC) 40 MG capsule Take 40 mg by mouth daily.  11/15/14  Yes Historical Provider, MD  promethazine (PHENERGAN) 25 MG tablet Take 25 mg by mouth every 4 (four) hours as needed for nausea or vomiting.    Yes Historical Provider, MD  ranitidine (ZANTAC) 150 MG tablet Take 150 mg by mouth 2 (two) times daily. 05/02/14  Yes Historical Provider, MD  sucralfate (CARAFATE) 1 g tablet Take 1 g by mouth 4 (four) times daily. 06/09/16  Yes Historical Provider, MD  topiramate (TOPAMAX) 100 MG tablet Take 200 mg  by mouth 2 (two) times daily.   Yes Historical Provider, MD  traZODone (DESYREL) 50 MG tablet Take 50 mg by mouth at bedtime.   Yes Historical Provider, MD  trimethoprim-polymyxin b (POLYTRIM) ophthalmic solution Place 1 drop into both eyes 4 (four) times daily. For 5 days 04/17/16  Yes Melony Overly, MD  venlafaxine (EFFEXOR) 75 MG tablet Take 75 mg by mouth 2 (two) times daily.   Yes Historical Provider, MD  verapamil (CALAN) 120 MG tablet Take 120 mg by mouth 3 (three) times daily. 02/05/15  Yes Historical Provider, MD  Vitamin D, Ergocalciferol, (DRISDOL) 50000 units CAPS capsule Take 50000 units once weekly on monday 03/09/16  Yes Historical Provider, MD  ondansetron (ZOFRAN) 4 MG tablet Take 1 tablet (4 mg total) by mouth every 6 (six) hours. 06/23/16   Delos Haring, PA-C    Family History Family History  Problem Relation Age of Onset  . Coronary artery disease Mother 85  . Coronary artery disease Maternal Grandmother   . Cancer Other   . Hypertension Other   . Stroke Other     Social History Social History  Substance Use Topics  . Smoking status: Never Smoker  . Smokeless tobacco: Never Used  . Alcohol use 0.0 oz/week     1 - 3 Glasses of wine per week     Comment: occasional     Allergies   Amantadines; Amitriptyline; Amlodipine; Aspirin; Latex; Simvastatin; and Tramadol   Review of Systems Review of Systems  Review of Systems All other systems negative except as documented in the HPI. All pertinent positives and negatives as reviewed in the HPI.  Physical Exam Updated Vital Signs BP (!) 162/102 (BP Location: Right Arm)   Pulse 85   Temp 98.2 F (36.8 C) (Oral)   Resp 16   Ht 5' 5.5" (1.664 m)   Wt 124.7 kg   LMP 06/02/2016   SpO2 97%   BMI 45.07 kg/m   Physical Exam  Constitutional: She appears well-developed and well-nourished. No distress.  HENT:  Head: Normocephalic and atraumatic.  Right Ear: Tympanic membrane and ear canal normal.  Left Ear: Tympanic membrane and ear canal normal.  Nose: Nose normal.  Mouth/Throat: Uvula is midline, oropharynx is clear and moist and mucous membranes are normal.  Eyes: Pupils are equal, round, and reactive to light.  Neck: Normal range of motion. Neck supple.  Cardiovascular: Normal rate and regular rhythm.   Pulmonary/Chest: Effort normal.  Abdominal: Soft.  No signs of abdominal distention  Musculoskeletal:  No LE swelling  Neurological: She is alert.  Acting at baseline Cranial nerves grossly intact on exam. Pt alert and oriented x 3 Upper and lower extremity strength is symmetrical and physiologic Normal muscular tone No facial droop Coordination intact, no limb ataxia,No pronator drift  Skin: Skin is warm and dry. No rash noted.  Nursing note and vitals reviewed.   ED Treatments / Results  Labs (all labs ordered are listed, but only abnormal results are displayed) Labs Reviewed  URINALYSIS, ROUTINE W REFLEX MICROSCOPIC - Abnormal; Notable for the following:       Result Value   Hgb urine dipstick MODERATE (*)    Bacteria, UA RARE (*)    Squamous Epithelial / LPF 0-5 (*)    All other components within normal limits    EKG   EKG Interpretation None      Radiology No results found.  Procedures Procedures (including critical care time)  Medications Ordered in ED  Medications  ondansetron (ZOFRAN-ODT) disintegrating tablet 4 mg (4 mg Oral Given 06/23/16 0916)  sodium chloride 0.9 % bolus 1,000 mL (0 mLs Intravenous Stopped 06/23/16 1237)  diphenhydrAMINE (BENADRYL) injection 25 mg (25 mg Intravenous Given 06/23/16 1140)  dexamethasone (DECADRON) injection 10 mg (10 mg Intravenous Given 06/23/16 1137)  metoCLOPramide (REGLAN) injection 10 mg (10 mg Intravenous Given 06/23/16 1140)  HYDROmorphone (DILAUDID) injection 1 mg (1 mg Intravenous Given 06/23/16 1251)  ondansetron (ZOFRAN) injection 4 mg (4 mg Intravenous Given 06/23/16 1251)  ketorolac (TORADOL) 30 MG/ML injection 30 mg (30 mg Intravenous Given 06/23/16 1434)     Initial Impression / Assessment and Plan / ED Course  I have reviewed the triage vital signs and the nursing notes.  Pertinent labs & imaging results that were available during my care of the patient were reviewed by me and considered in my medical decision making (see chart for details).   Pt HA treated and improved while in ED.  Presentation is like pts typical HA and non concerning for [facility], ICH, Meningitis, or temporal arteritis. Pt is afebrile with no focal neuro deficits, nuchal rigidity, or change in vision. Pt is to follow up with PCP to discuss prophylactic medication. Pt verbalizes understanding and is agreeable with plan to dc.    Final Clinical Impressions(s) / ED Diagnoses   Final diagnoses:  Other migraine without status migrainosus, not intractable    New Prescriptions New Prescriptions   ONDANSETRON (ZOFRAN) 4 MG TABLET    Take 1 tablet (4 mg total) by mouth every 6 (six) hours.     Delos Haring, PA-C 06/23/16 1536    Tanna Furry, MD 07/06/16 [phone]

## 2016-06-26 ENCOUNTER — Encounter: Payer: Self-pay | Admitting: Physical Therapy

## 2016-06-26 ENCOUNTER — Ambulatory Visit: Payer: Medicare Other | Admitting: Physical Therapy

## 2016-06-26 DIAGNOSIS — M545 Low back pain, unspecified: Secondary | ICD-10-CM

## 2016-06-26 DIAGNOSIS — R262 Difficulty in walking, not elsewhere classified: Secondary | ICD-10-CM

## 2016-06-26 DIAGNOSIS — M62830 Muscle spasm of back: Secondary | ICD-10-CM

## 2016-06-26 DIAGNOSIS — G8929 Other chronic pain: Secondary | ICD-10-CM

## 2016-06-26 NOTE — Therapy (Signed)
[place], [place], [address] Phone: [phone]   Fax:  [phone]  Physical Therapy Treatment  Patient Details  Name: [name] MRN: [id number] Date of Birth: [birth year] Referring Provider: Dr Rodell Perna   Encounter Date: 06/26/2016      PT End of Session - 06/26/16 1305    Visit Number 6   Number of Visits 16   Date for PT Re-Evaluation 07/07/16   Authorization Type Medicare   PT Start Time [id number]   PT Stop Time 1118   PT Time Calculation (min) 55 min   Activity Tolerance Patient tolerated treatment well   Behavior During Therapy [facility] for tasks assessed/performed      Past Medical History:  Diagnosis Date  . Asthma   . Biliary dyskinesia    a. s/p cholecystectomy.  . 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 Abnl stress [place] 2008, f/u cath reportedly nl. b. ETT-Myoview 04/2011 - EKG changes but normal perfusion. Cor CT - no coronary calcium, no definite stenosis though mRCA not fully evaluated. c. 10/2011 - tn elevated in [place], LHC without CAD. Started on Ranexa, anti-anginals ?microvascular dz but later stopped while in hospital on abx.  . Complication of anesthesia    hard to wake up-had to be reminded to breath  . GERD (gastroesophageal reflux disease)    a. Severe.  Marland Kitchen HTN (hypertension)   . Hx of cardiovascular stress test    Lex Myoview 8/14:  Normal, EF 74%  . Hx of echocardiogram    Echo 3/16:  Mild LVH, EF 55-60%, Gr 1 DD, trivial MR, mild LAE, normal RVF  . Hypothyroid   . MI (myocardial infarction)   . Migraine   . MRSA infection    a. After vagal nerve stimulator at [facility] - surgical site MRSA infection, PICC placed.  . Obesity   . Palpitations    a. 08/2014: 48 hour holter with 2 PVCs otherwise normal.  . Seizure disorder ([place])    a. since childhood. b. s/p vagal nerve stimulator at [facility].  . Seizures ([place])     Past Surgical History:  Procedure  Laterality Date  . [place] N/A 06/01/2016   Procedure: Right Heart Cath;  Surgeon: Larey Dresser, MD;  Location: [place] [facility];  Service: Cardiovascular;  Laterality: N/A;  . CESAREAN SECTION     placement of vagal nerve stimulator.  . CHOLECYSTECTOMY N/A 01/24/2013   Procedure: LAPAROSCOPIC CHOLECYSTECTOMY;  Surgeon: Harl Bowie, MD;  Location: [place];  Service: General;  Laterality: N/A;  . IMPLANTATION VAGAL NERVE STIMULATOR  [phone]   [facility]    There were no vitals filed for this visit.      Subjective Assessment - 06/26/16 1027    Subjective Patient continues to report improvement. She was able to do her stretches over the weekend. She continues to report 5-6/10 pain despite reporting that it is much better.    Limitations Standing;Walking   How long can you sit comfortably? > 1 hour    How long can you stand comfortably? < 10 min    How long can you walk comfortably? < 300' without increased pain    Diagnostic tests x-ray: no ankle fx    Patient Stated Goals to have less pain when perfroming activity    Currently in Pain? Yes  Pain Score 5    Pain Location Head   Pain Orientation Right;Left   Pain Descriptors / Indicators Throbbing   Pain Type Acute pain   Pain Onset Today   Pain Frequency Occasional   Aggravating Factors  movement    Pain Relieving Factors rest    Effect of Pain on Daily Activities difficulty perfroming ADL's    Multiple Pain Sites Yes   Pain Score 3   Pain Location Ankle   Pain Orientation Right   Pain Descriptors / Indicators Aching   Pain Type Chronic pain   Pain Onset More than a month ago   Pain Frequency Constant   Aggravating Factors  standing, walking    Pain Relieving Factors rest    Effect of Pain on Daily Activities difficulty walking and perfroming ADL's                          [facility] Adult PT  Treatment/Exercise - 06/26/16 0001      Lumbar Exercises: Stretches   Single Knee to Chest Stretch Limitations 2x20sec with towel on the right    Lower Trunk Rotation Limitations x10 in low range    Piriformis Stretch Limitations with towell 3x20 seconds each side      Lumbar Exercises: Supine   Clam Limitations yellow 2x10    Other Supine Lumbar Exercises Abdominal breathing x10 ; ball squeeze 2x10;      Modalities   Modalities Electrical Stimulation;Moist Heat     Moist Heat Therapy   Number Minutes Moist Heat 15 Minutes   Moist Heat Location Lumbar Spine     Electrical Stimulation   Electrical Stimulation Location lower back    Electrical Stimulation Action IFC   Electrical Stimulation Parameters to tolerance    Electrical Stimulation Goals Pain     Manual Therapy   Manual therapy comments  PA glide on the right hip; trigger point release to bilateral lumbar spine, Posterior glide on the right to improve right hip flexion; Ankle DF stretch; ankle anterior draw glide.      Ankle Exercises: Seated   Heel Raises Limitations 2x10    Other Seated Ankle Exercises windshield wiper 2x10                 PT Education - 06/26/16 1030    Education provided Yes   Education Details continue with ankle stretching and strengthening    Person(s) Educated Patient   Methods Explanation;Demonstration   Comprehension Verbalized understanding;Returned demonstration          PT Short Term Goals - 06/26/16 1311      PT SHORT TERM GOAL #1   Title Patient will increase lumbar flexion and extension by 25%    Baseline improving motion    Time 4   Period Weeks   Status On-going     PT SHORT TERM GOAL #2   Title Patient will increase gross right ankle strength to 4/5 for ER/IR and DF    Baseline added DF resistance   Time 4   Period Weeks   Status On-going     PT SHORT TERM GOAL #3   Title Patient will demsotrate a good core contraction    Time 4   Period Weeks   Status  On-going     PT SHORT TERM GOAL #4   Title Patient will be independent with initial HEP    Time 4   Period Weeks   Status On-going  PT Long Term Goals - 05/15/16 1009      PT LONG TERM GOAL #1   Title Patient will stand for 1 hour without report of ankle and back pain    Time 4   Period Weeks   Status New     PT LONG TERM GOAL #2   Title Patient will go up/down 8 steps without increased pain    Time 4   Period Weeks   Status New     PT LONG TERM GOAL #3   Title Patient will ambualte 3000' without increased pain in order to go shopping   Time 4   Status New     PT LONG TERM GOAL #4   Title Patient will return to gym with a program to improve core and lower extremity strength    Time 4   Period Weeks   Status New               Plan - 06/26/16 1308    Clinical Impression Statement Patient reported pain with seated windshield wiper of the right ankle. Her range inmproved with PROM. The patient was 8 minutes lat to her appointment. She reports her back is improving.    Rehab Potential Fair   Clinical Impairments Affecting Rehab Potential long standing lower back pain    PT Frequency 2x / week   PT Duration 8 weeks   PT Treatment/Interventions ADLs/Self Care Home Management;Cryotherapy;Electrical Stimulation;Gait training;Stair training;Ultrasound;Traction;Iontophoresis 4mg /ml Dexamethasone;Moist Heat;Therapeutic activities;Therapeutic exercise;Neuromuscular re-education;Patient/family education;Manual techniques;Taping;Splinting;Energy conservation;Dry needling;Passive range of motion   PT Next Visit Plan consider maual therapy to increase hip motion, Continue with HEP; Consider light core strengthening clam shell, ball squeeze, Continue to perfrom manual DF ROM; add light resistance to ankle 4 way, patient is alergic to latex though. Continue to work on seated ankle exercises.     PT Home Exercise Plan LTR, abdominal breathing, single knee to chest stretch,  ankle    Consulted and Agree with Plan of Care Patient      Patient will benefit from skilled therapeutic intervention in order to improve the following deficits and impairments:  Abnormal gait, Decreased strength, Decreased mobility, Pain, Increased muscle spasms, Decreased endurance, Decreased safety awareness, Decreased activity tolerance  Visit Diagnosis: Chronic bilateral low back pain without sciatica  Muscle spasm of back  Difficulty in walking, not elsewhere classified     Problem List Patient Active Problem List   Diagnosis Date Noted  . Midline low back pain without sciatica 04/19/2016  . Abnormal weight gain 06/11/2015  . BMI 40.0-44.9, adult ([place]) 06/11/2015  . Spells 01/03/2015  . Asthma 08/24/2014  . Chronic diastolic heart failure [id number]  . Allergic rhinitis 05/04/2014  . Severe persistent asthma 05/04/2014  . Pharyngoesophageal dysphagia 01/30/2014  . Chest pain syndrome 07/02/2013  . Hypothyroidism 07/02/2013  . Hypothyroid   . Chronic chest pain   . GERD (gastroesophageal reflux disease) 02/10/2013  . Biliary dyskinesia 12/30/2012  . Exertional dyspnea 09/19/2012  . Status post VNS (vagus nerve stimulator) placement 06/10/2012  . Refusal of blood transfusions as patient is Jehovah's Witness 05/20/2012  . Dyslipidemia 12/29/2011  . 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 Dyslipidemia, goal LDL below 70 05/17/2011  . Non-cardiac chest pain secondary to GERD 04/24/2011  . HTN, goal below 130/80 04/24/2011    Carney Living PT [name]  06/26/2016, 1:13 PM  [facility] [address] [place], [place], [address] Phone: [phone]  Fax:  [phone]  Name: [name] MRN: [id number] Date of Birth: 1965-11-06

## 2016-06-29 ENCOUNTER — Ambulatory Visit: Payer: Medicare Other | Admitting: Physical Therapy

## 2016-07-03 ENCOUNTER — Ambulatory Visit: Payer: Medicare Other | Admitting: Physical Therapy

## 2016-07-03 DIAGNOSIS — M545 Low back pain, unspecified: Secondary | ICD-10-CM

## 2016-07-03 DIAGNOSIS — M62830 Muscle spasm of back: Secondary | ICD-10-CM

## 2016-07-03 DIAGNOSIS — R262 Difficulty in walking, not elsewhere classified: Secondary | ICD-10-CM

## 2016-07-03 DIAGNOSIS — G8929 Other chronic pain: Secondary | ICD-10-CM

## 2016-07-03 NOTE — Therapy (Signed)
[place], [place], [address] Phone: [phone]   Fax:  [phone]  Physical Therapy Treatment  Patient Details  Name: [name] MRN: [id number] Date of Birth: 07/09/1965 Referring Provider: Dr Rodell Perna   Encounter Date: 07/03/2016      PT End of Session - 07/03/16 1308    Visit Number 7   Number of Visits 16   Date for PT Re-Evaluation 07/07/16   Authorization Type Medicare   PT Start Time [id number]   PT Stop Time 1116   PT Time Calculation (min) 53 min   Activity Tolerance Patient tolerated treatment well   Behavior During Therapy [facility] for tasks assessed/performed      Past Medical History:  Diagnosis Date  . Asthma   . Biliary dyskinesia    a. s/p cholecystectomy.  . 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 Abnl stress [place] 2008, f/u cath reportedly nl. b. ETT-Myoview 04/2011 - EKG changes but normal perfusion. Cor CT - no coronary calcium, no definite stenosis though mRCA not fully evaluated. c. 10/2011 - [place], LHC without CAD. Started on Ranexa, anti-anginals ?microvascular dz but later stopped while in hospital on abx.  . Complication of anesthesia    hard to wake up-had to be reminded to breath  . GERD (gastroesophageal reflux disease)    a. Severe.  Marland Kitchen HTN (hypertension)   . Hx of cardiovascular stress test    Lex Myoview 8/14:  Normal, EF 74%  . Hx of echocardiogram    Echo 3/16:  Mild LVH, EF 55-60%, Gr 1 DD, trivial MR, mild LAE, normal RVF  . Hypothyroid   . MI (myocardial infarction)   . Migraine   . MRSA infection    a. After vagal nerve stimulator at [facility] - surgical site MRSA infection, PICC placed.  . Obesity   . Palpitations    a. 08/2014: 48 hour holter with 2 PVCs otherwise normal.  . Seizure disorder ([place])    a. since childhood. b. s/p vagal nerve stimulator at [facility].  . Seizures ([place])     Past Surgical History:  Procedure  Laterality Date  . [place] N/A 06/01/2016   Procedure: Right Heart Cath;  Surgeon: Larey Dresser, MD;  Location: [place] [facility];  Service: Cardiovascular;  Laterality: N/A;  . CESAREAN SECTION     placement of vagal nerve stimulator.  . CHOLECYSTECTOMY N/A 01/24/2013   Procedure: LAPAROSCOPIC CHOLECYSTECTOMY;  Surgeon: Harl Bowie, MD;  Location: [place];  Service: General;  Laterality: N/A;  . IMPLANTATION VAGAL NERVE STIMULATOR  [phone]   [facility]    There were no vitals filed for this visit.      Subjective Assessment - 07/03/16 1045    Subjective Patient rpeorts that she is sore today. She did house wotrk over the weekedn and now she is sore. Her painat this time is about a 6/10.    Limitations Standing;Walking   How long can you sit comfortably? > 1 hour    How long can you stand comfortably? < 10 min    How long can you walk comfortably? < 300' without increased pain    Diagnostic tests x-ray: no ankle fx    Patient Stated Goals to have less pain when perfroming activity    Currently in Pain? Yes   Pain  Score 6    Pain Location Back   Pain Orientation Right;Left   Pain Descriptors / Indicators Throbbing   Pain Type Acute pain   Pain Onset Today   Pain Frequency Occasional   Aggravating Factors  movement    Pain Relieving Factors rest    Effect of Pain on Daily Activities difficulty perfroming ADL's    Multiple Pain Sites Yes   Pain Score 2   Pain Location Ankle   Pain Orientation Right   Pain Descriptors / Indicators Aching   Pain Type Chronic pain   Pain Onset More than a month ago   Pain Frequency Constant   Aggravating Factors  standing, walking    Pain Relieving Factors rest   Effect of Pain on Daily Activities difficulty walking and perfroming ADL's                          [facility] Adult PT Treatment/Exercise - 07/03/16 0001       Self-Care   Self-Care ADL's   ADL's Patientgiven sheets that discuss posture while perfroming ADL's. Therapy demonstrated bending to pick up laundry and pushing a broom or a vaccum. The patient verbalized an understanding.      Lumbar Exercises: Stretches   Single Knee to Chest Stretch Limitations 2x20sec with towel on the right    Lower Trunk Rotation Limitations x10 in low range    Piriformis Stretch Limitations with towell 3x20 seconds each side      Lumbar Exercises: Supine   Clam Limitations yellow 2x10    Other Supine Lumbar Exercises Abdominal breathing x10 ; ball squeeze 2x10;      Modalities   Modalities Electrical Stimulation;Moist Heat     Electrical Stimulation   Electrical Stimulation Location lower back    Electrical Stimulation Action IFC   Electrical Stimulation Parameters to tolerance    Electrical Stimulation Goals Pain     Manual Therapy   Manual therapy comments  PA glide on the right hip; trigger point release to bilateral lumbar spine, Posterior glide on the right to improve right hip flexion; Ankle DF stretch; ankle anterior draw glide.                 PT Education - 07/03/16 1046    Education provided Yes   Education Details continue with ankle stretching and strengtthening    Person(s) Educated Patient   Methods Explanation;Demonstration   Comprehension Verbalized understanding;Returned demonstration          PT Short Term Goals - 07/03/16 1048      PT SHORT TERM GOAL #1   Title Patient will increase lumbar flexion and extension by 25%    Baseline improving motion    Time 4   Period Weeks   Status On-going     PT SHORT TERM GOAL #2   Title Patient will increase gross right ankle strength to 4/5 for ER/IR and DF    Baseline added DF resistance   Time 4   Period Weeks   Status On-going     PT SHORT TERM GOAL #3   Title Patient will demsotrate a good core contraction    Baseline improved contraction    Time 4   Period Weeks    Status On-going     PT SHORT TERM GOAL #4   Title Patient will be independent with initial HEP    Time 4   Period Weeks   Status On-going  PT Long Term Goals - 05/15/16 1009      PT LONG TERM GOAL #1   Title Patient will stand for 1 hour without report of ankle and back pain    Time 4   Period Weeks   Status New     PT LONG TERM GOAL #2   Title Patient will go up/down 8 steps without increased pain    Time 4   Period Weeks   Status New     PT LONG TERM GOAL #3   Title Patient will ambualte 3000' without increased pain in order to go shopping   Time 4   Status New     PT LONG TERM GOAL #4   Title Patient will return to gym with a program to improve core and lower extremity strength    Time 4   Period Weeks   Status New               Plan - 07/03/16 1043    Clinical Impression Statement Patient was 8 minutes late for her appointment. Therapy gave her informational education on proper perfromance of ADL's. She verablized an understanding. She tolerated exercises well. She reported improved pain with treatment.    Rehab Potential Good   Clinical Impairments Affecting Rehab Potential long standing lower back pain    PT Frequency 2x / week   PT Duration 8 weeks   PT Treatment/Interventions ADLs/Self Care Home Management;Cryotherapy;Electrical Stimulation;Gait training;Stair training;Ultrasound;Traction;Iontophoresis 4mg /ml Dexamethasone;Moist Heat;Therapeutic activities;Therapeutic exercise;Neuromuscular re-education;Patient/family education;Manual techniques;Taping;Splinting;Energy conservation;Dry needling;Passive range of motion   PT Next Visit Plan consider maual therapy to increase hip motion, Continue with HEP; Consider light core strengthening clam shell, ball squeeze, Continue to perfrom manual DF ROM; add light resistance to ankle 4 way, patient is alergic to latex though. Continue to work on seated ankle exercises.     PT Home Exercise Plan LTR,  abdominal breathing, single knee to chest stretch, ankle    Consulted and Agree with Plan of Care Patient      Patient will benefit from skilled therapeutic intervention in order to improve the following deficits and impairments:  Abnormal gait, Decreased strength, Decreased mobility, Pain, Increased muscle spasms, Decreased endurance, Decreased safety awareness, Decreased activity tolerance  Visit Diagnosis: Chronic bilateral low back pain without sciatica  Muscle spasm of back  Difficulty in walking, not elsewhere classified     Problem List Patient Active Problem List   Diagnosis Date Noted  . Midline low back pain without sciatica 04/19/2016  . Abnormal weight gain 06/11/2015  . BMI 40.0-44.9, adult ([place]) 06/11/2015  . Spells 01/03/2015  . Asthma 08/24/2014  . Chronic diastolic heart failure [id number]  . Allergic rhinitis 05/04/2014  . Severe persistent asthma 05/04/2014  . Pharyngoesophageal dysphagia 01/30/2014  . Chest pain syndrome 07/02/2013  . Hypothyroidism 07/02/2013  . Hypothyroid   . Chronic chest pain   . GERD (gastroesophageal reflux disease) 02/10/2013  . Biliary dyskinesia 12/30/2012  . Exertional dyspnea 09/19/2012  . Status post VNS (vagus nerve stimulator) placement 06/10/2012  . Refusal of blood transfusions as patient is Jehovah's Witness 05/20/2012  . Dyslipidemia 12/29/2011  . 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 Dyslipidemia, goal LDL below 70 05/17/2011  . Non-cardiac chest pain secondary to GERD 04/24/2011  . HTN, goal below 130/80 04/24/2011    Carney Living PT [name]  07/03/2016, 1:10 PM  [facility] [address] [place], [place], [address] Phone: [phone]  Fax:  [phone]  Name: [name] MRN: [id number] Date of Birth: 1965/12/15

## 2016-07-05 DIAGNOSIS — F332 Major depressive disorder, recurrent severe without psychotic features: Secondary | ICD-10-CM | POA: Diagnosis not present

## 2016-07-06 ENCOUNTER — Ambulatory Visit: Payer: Medicare Other | Admitting: Physical Therapy

## 2016-07-19 DIAGNOSIS — Z713 Dietary counseling and surveillance: Secondary | ICD-10-CM | POA: Diagnosis not present

## 2016-07-19 DIAGNOSIS — Z6841 Body Mass Index (BMI) 40.0 and over, adult: Secondary | ICD-10-CM | POA: Diagnosis not present

## 2016-07-31 ENCOUNTER — Encounter: Payer: Self-pay | Admitting: Physical Therapy

## 2016-07-31 ENCOUNTER — Ambulatory Visit: Payer: Medicare Other | Attending: Family Medicine | Admitting: Physical Therapy

## 2016-07-31 DIAGNOSIS — G8929 Other chronic pain: Secondary | ICD-10-CM | POA: Diagnosis not present

## 2016-07-31 DIAGNOSIS — M545 Low back pain, unspecified: Secondary | ICD-10-CM

## 2016-07-31 DIAGNOSIS — M62830 Muscle spasm of back: Secondary | ICD-10-CM

## 2016-08-01 NOTE — Therapy (Signed)
[place], [place], [address] Phone: [phone]   Fax:  [phone]  Physical Therapy Treatment/ Recert  Patient Details  Name: [name] MRN: [id number] Date of Birth: [birth year] Referring Provider: Dr Rodell Perna   Encounter Date: 07/31/2016      PT End of Session - 07/31/16 1121    Visit Number 8   Number of Visits 16   Date for PT Re-Evaluation 08/28/16   Authorization Type Medicare   PT Start Time 1100   PT Stop Time 1145   PT Time Calculation (min) 45 min   Activity Tolerance Patient tolerated treatment well   Behavior During Therapy [facility] for tasks assessed/performed      Past Medical History:  Diagnosis Date  . Asthma   . Biliary dyskinesia    a. s/p cholecystectomy.  . 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 Abnl stress [place] 2008, f/u cath reportedly nl. b. ETT-Myoview 04/2011 - EKG changes but normal perfusion. Cor CT - no coronary calcium, no definite stenosis though mRCA not fully evaluated. c. 10/2011 - tn elevated in [place], LHC without CAD. Started on Ranexa, anti-anginals ?microvascular dz but later stopped while in hospital on abx.  . Complication of anesthesia    hard to wake up-had to be reminded to breath  . GERD (gastroesophageal reflux disease)    a. Severe.  Marland Kitchen HTN (hypertension)   . Hx of cardiovascular stress test    Lex Myoview 8/14:  Normal, EF 74%  . Hx of echocardiogram    Echo 3/16:  Mild LVH, EF 55-60%, Gr 1 DD, trivial MR, mild LAE, normal RVF  . Hypothyroid   . MI (myocardial infarction)   . Migraine   . MRSA infection    a. After vagal nerve stimulator at [facility] - surgical site MRSA infection, PICC placed.  . Obesity   . Palpitations    a. 08/2014: 48 hour holter with 2 PVCs otherwise normal.  . Seizure disorder ([place])    a. since childhood. b. s/p vagal nerve stimulator at [facility].  . Seizures ([place])     Past Surgical History:   Procedure Laterality Date  . [place] N/A 06/01/2016   Procedure: Right Heart Cath;  Surgeon: Larey Dresser, MD;  Location: [place] CV [facility];  Service: Cardiovascular;  Laterality: N/A;  . CESAREAN SECTION     placement of vagal nerve stimulator.  . CHOLECYSTECTOMY N/A 01/24/2013   Procedure: LAPAROSCOPIC CHOLECYSTECTOMY;  Surgeon: Harl Bowie, MD;  Location: [place];  Service: General;  Laterality: N/A;  . IMPLANTATION VAGAL NERVE STIMULATOR  [phone]   [facility]    There were no vitals filed for this visit.      Subjective Assessment - 07/31/16 1107    Subjective The patient has been feeling sick for ablut a month. She had theflu and then she has a vitamin D deficiency. She reports over that time her back has returned to how it was before. The pain in her lwoer back is in the middle. The pain is going down both legs. Her right ankle is still hurting. She has tried to do ankle exercises but she reports she has not been able to do much because she has been feeling sick.    Limitations Standing;Walking   How long can you sit comfortably? > 1 hour  How long can you stand comfortably? < 10 min    How long can you walk comfortably? < 300' without increased pain    Diagnostic tests x-ray: no ankle fx    Patient Stated Goals to have less pain when perfroming activity    Currently in Pain? Yes   Pain Score 10-Worst pain ever   Pain Location Back   Pain Orientation Right;Left   Pain Descriptors / Indicators Throbbing   Pain Type Acute pain   Pain Onset Today   Pain Frequency Occasional   Aggravating Factors  movement    Pain Relieving Factors rest    Effect of Pain on Daily Activities difficulty perfroming activity    Multiple Pain Sites Yes   Pain Score 4   Pain Location Ankle   Pain Orientation Right   Pain Descriptors / Indicators Aching   Pain Type Chronic pain    Pain Onset More than a month ago   Pain Frequency Constant   Aggravating Factors  standing and walking    Pain Relieving Factors rest    Effect of Pain on Daily Activities difficulty walking and perfroming ADL's             [facility] PT Assessment - 08/01/16 0001      AROM   Lumbar Flexion 75%   limited by pain in the ankle    Lumbar Extension 50% with pain    Lumbar - Right Side Bend 25% with pain    Lumbar - Left Side Bend 25% with pain    Lumbar - Right Rotation 25% with pain    Lumbar - Left Rotation 25% with pain      Strength   Right Hip Flexion 3/5   Right Hip Extension 3/5   Right Hip ABduction 4/5   Left Hip ABduction 3/5   Left Hip ADduction 3/5   Right Ankle Dorsiflexion 3/5   Right Ankle Plantar Flexion 1/5   Right Ankle Inversion 3/5     Palpation   Palpation comment Tenderness to palpation in the upper right ankle and into the lateral maleolus; ; Spasming of the lower back L > R      Transfers   Comments Min a to transfer from supien. Minu cuing for proper log roll                              PT Education - 07/31/16 1741    Education provided Yes   Education Details improtance of continuing strengthening and stretching.    Person(s) Educated Patient   Methods Explanation;Demonstration   Comprehension Verbalized understanding;Returned demonstration          PT Short Term Goals - 08/01/16 1512      PT SHORT TERM GOAL #1   Title Patient will increase lumbar flexion and extension by 25%    Baseline limited 75% flexion; 50% extension 2/26   Time 4   Period Weeks   Status On-going     PT SHORT TERM GOAL #2   Title Patient will increase gross right ankle strength to 4/5 for ER/IR and DF    Baseline 3/5 DF today    Time 4   Period Weeks   Status On-going     PT SHORT TERM GOAL #3   Title Patient will demsotrate a good core contraction    Baseline Unable to perfrom basic core contraction today    Time 4   Period Weeks  Status On-going     PT SHORT TERM GOAL #4   Title Patient will be independent with initial HEP    Time 4   Period Weeks   Status On-going           PT Long Term Goals - 05/15/16 1009      PT LONG TERM GOAL #1   Title Patient will stand for 1 hour without report of ankle and back pain    Time 4   Period Weeks   Status New     PT LONG TERM GOAL #2   Title Patient will go up/down 8 steps without increased pain    Time 4   Period Weeks   Status New     PT LONG TERM GOAL #3   Title Patient will ambualte 3000' without increased pain in order to go shopping   Time 4   Status New     PT LONG TERM GOAL #4   Title Patient will return to gym with a program to improve core and lower extremity strength    Time 4   Period Weeks   Status New               Plan - 05-Aug-2016 1122    Clinical Impression Statement Patient had difficulty with basic ankle pumps and light stretching today. She reports she has been perfroming this activity at home but she was having significant pain with them in the clinic today. She appears to be back were she started after a month off. The patient was advised since there was very little carryover that she may need to schedule an appointment with her MD. She will be seen 2x a week for 4 weeks to see if she can get back to the point were she was at before her hiatus. She had significant improvement in pain and gait prior to becoming sick.    Rehab Potential Good   Clinical Impairments Affecting Rehab Potential long standing lower back pain    PT Frequency 2x / week   PT Duration 8 weeks   PT Treatment/Interventions ADLs/Self Care Home Management;Cryotherapy;Electrical Stimulation;Gait training;Stair training;Ultrasound;Traction;Iontophoresis 4mg /ml Dexamethasone;Moist Heat;Therapeutic activities;Therapeutic exercise;Neuromuscular re-education;Patient/family education;Manual techniques;Taping;Splinting;Energy conservation;Dry needling;Passive range of  motion   PT Next Visit Plan consider maual therapy to increase hip motion, Continue with HEP; Consider light core strengthening clam shell, ball squeeze, Continue to perfrom manual DF ROM; add light resistance to ankle 4 way, patient is alergic to latex though. Continue to work on seated ankle exercises.     PT Home Exercise Plan LTR, abdominal breathing, single knee to chest stretch, ankle    Consulted and Agree with Plan of Care Patient      Patient will benefit from skilled therapeutic intervention in order to improve the following deficits and impairments:  Abnormal gait, Decreased strength, Decreased mobility, Pain, Increased muscle spasms, Decreased endurance, Decreased safety awareness, Decreased activity tolerance  Visit Diagnosis: Chronic bilateral low back pain without sciatica - Plan: PT plan of care cert/re-cert  Muscle spasm of back - Plan: PT plan of care cert/re-cert       G-Codes - August 05, 2016 1513    Functional Assessment Tool Used (Outpatient Only) Clinical decision making    Functional Limitation Mobility: Walking and moving around   Mobility: Walking and Moving Around Current Status [id number]) At least 60 percent but less than 80 percent impaired, limited or restricted   Mobility: Walking and Moving Around Goal Status [id number]) At least 20 percent but less  than 40 percent impaired, limited or restricted      Problem List Patient Active Problem List   Diagnosis Date Noted  . Midline low back pain without sciatica 04/19/2016  . Abnormal weight gain 06/11/2015  . BMI 40.0-44.9, adult ([place]) 06/11/2015  . Spells 01/03/2015  . Asthma 08/24/2014  . Chronic diastolic heart failure [id number]  . Allergic rhinitis 05/04/2014  . Severe persistent asthma 05/04/2014  . Pharyngoesophageal dysphagia 01/30/2014  . Chest pain syndrome 07/02/2013  . Hypothyroidism 07/02/2013  . Hypothyroid   . Chronic chest pain   . GERD (gastroesophageal reflux disease) 02/10/2013  . Biliary  dyskinesia 12/30/2012  . Exertional dyspnea 09/19/2012  . Status post VNS (vagus nerve stimulator) placement 06/10/2012  . Refusal of blood transfusions as patient is Jehovah's Witness 05/20/2012  . Dyslipidemia 12/29/2011  . 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 Dyslipidemia, goal LDL below 70 05/17/2011  . Non-cardiac chest pain secondary to GERD 04/24/2011  . HTN, goal below 130/80 04/24/2011    Carney Living 08/01/2016, 3:21 PM  [facility] [address] [place], [place], [address] Phone: [phone]   Fax:  [phone]  Name: [name] MRN: [id number] Date of Birth: 03-23-1966

## 2016-08-02 ENCOUNTER — Ambulatory Visit: Payer: Medicare Other | Admitting: Physical Therapy

## 2016-08-03 ENCOUNTER — Emergency Department (HOSPITAL_COMMUNITY): Payer: Medicare Other

## 2016-08-03 ENCOUNTER — Encounter (HOSPITAL_COMMUNITY): Payer: Self-pay

## 2016-08-03 DIAGNOSIS — K219 Gastro-esophageal reflux disease without esophagitis: Secondary | ICD-10-CM | POA: Insufficient documentation

## 2016-08-03 DIAGNOSIS — Z8249 Family history of ischemic heart disease and other diseases of the circulatory system: Secondary | ICD-10-CM | POA: Insufficient documentation

## 2016-08-03 DIAGNOSIS — I5033 Acute on chronic diastolic (congestive) heart failure: Secondary | ICD-10-CM | POA: Diagnosis not present

## 2016-08-03 DIAGNOSIS — Z886 Allergy status to analgesic agent status: Secondary | ICD-10-CM | POA: Insufficient documentation

## 2016-08-03 DIAGNOSIS — J4550 Severe persistent asthma, uncomplicated: Secondary | ICD-10-CM | POA: Insufficient documentation

## 2016-08-03 DIAGNOSIS — R079 Chest pain, unspecified: Principal | ICD-10-CM | POA: Insufficient documentation

## 2016-08-03 DIAGNOSIS — I252 Old myocardial infarction: Secondary | ICD-10-CM | POA: Insufficient documentation

## 2016-08-03 DIAGNOSIS — Z6841 Body Mass Index (BMI) 40.0 and over, adult: Secondary | ICD-10-CM | POA: Insufficient documentation

## 2016-08-03 DIAGNOSIS — G40909 Epilepsy, unspecified, not intractable, without status epilepticus: Secondary | ICD-10-CM | POA: Insufficient documentation

## 2016-08-03 DIAGNOSIS — E039 Hypothyroidism, unspecified: Secondary | ICD-10-CM | POA: Insufficient documentation

## 2016-08-03 DIAGNOSIS — G43909 Migraine, unspecified, not intractable, without status migrainosus: Secondary | ICD-10-CM | POA: Diagnosis not present

## 2016-08-03 DIAGNOSIS — G894 Chronic pain syndrome: Secondary | ICD-10-CM | POA: Diagnosis not present

## 2016-08-03 DIAGNOSIS — Z91040 Latex allergy status: Secondary | ICD-10-CM | POA: Diagnosis not present

## 2016-08-03 DIAGNOSIS — Z9049 Acquired absence of other specified parts of digestive tract: Secondary | ICD-10-CM | POA: Diagnosis not present

## 2016-08-03 DIAGNOSIS — Z79899 Other long term (current) drug therapy: Secondary | ICD-10-CM | POA: Diagnosis not present

## 2016-08-03 DIAGNOSIS — Z888 Allergy status to other drugs, medicaments and biological substances status: Secondary | ICD-10-CM | POA: Diagnosis not present

## 2016-08-03 DIAGNOSIS — Z8052 Family history of malignant neoplasm of bladder: Secondary | ICD-10-CM | POA: Diagnosis not present

## 2016-08-03 DIAGNOSIS — R072 Precordial pain: Secondary | ICD-10-CM | POA: Diagnosis not present

## 2016-08-03 DIAGNOSIS — Z823 Family history of stroke: Secondary | ICD-10-CM | POA: Diagnosis not present

## 2016-08-03 DIAGNOSIS — I110 Hypertensive heart disease with heart failure: Secondary | ICD-10-CM | POA: Diagnosis not present

## 2016-08-03 LAB — CBC
HCT: 39.1 % (ref 36.0–46.0)
Hemoglobin: 12.1 g/dL (ref 12.0–15.0)
MCH: 26.1 pg (ref 26.0–34.0)
MCHC: 30.9 g/dL (ref 30.0–36.0)
MCV: 84.4 fL (ref 78.0–100.0)
Platelets: 226 10*3/uL (ref 150–400)
RBC: 4.63 MIL/uL (ref 3.87–5.11)
RDW: 15.4 % (ref 11.5–15.5)
WBC: 7.4 10*3/uL (ref 4.0–10.5)

## 2016-08-03 MED ORDER — ONDANSETRON 4 MG PO TBDP
ORAL_TABLET | ORAL | Status: AC
  Administered 2016-08-03: 4 mg
  Filled 2016-08-03: qty 1

## 2016-08-03 NOTE — ED Triage Notes (Signed)
Pt endorses central chest pain with radiation to the right arm and n/v. Pt states that she has taken 4 nitro tablets with a nitro patch that she placed on her chest at 1600 with no relief.

## 2016-08-04 ENCOUNTER — Observation Stay (HOSPITAL_COMMUNITY)
Admission: EM | Admit: 2016-08-04 | Discharge: 2016-08-05 | Disposition: A | Discharge status: Home / Self Care | Payer: Medicare Other | Attending: Internal Medicine | Admitting: Internal Medicine

## 2016-08-04 DIAGNOSIS — Z6841 Body Mass Index (BMI) 40.0 and over, adult: Secondary | ICD-10-CM

## 2016-08-04 DIAGNOSIS — R079 Chest pain, unspecified: Secondary | ICD-10-CM

## 2016-08-04 DIAGNOSIS — G894 Chronic pain syndrome: Secondary | ICD-10-CM | POA: Diagnosis not present

## 2016-08-04 DIAGNOSIS — J45909 Unspecified asthma, uncomplicated: Secondary | ICD-10-CM | POA: Diagnosis present

## 2016-08-04 DIAGNOSIS — K219 Gastro-esophageal reflux disease without esophagitis: Secondary | ICD-10-CM | POA: Diagnosis not present

## 2016-08-04 DIAGNOSIS — I5032 Chronic diastolic (congestive) heart failure: Secondary | ICD-10-CM | POA: Diagnosis not present

## 2016-08-04 DIAGNOSIS — J4550 Severe persistent asthma, uncomplicated: Secondary | ICD-10-CM | POA: Diagnosis present

## 2016-08-04 DIAGNOSIS — E039 Hypothyroidism, unspecified: Secondary | ICD-10-CM | POA: Diagnosis present

## 2016-08-04 HISTORY — DX: Chest pain, unspecified: R07.9

## 2016-08-04 LAB — BASIC METABOLIC PANEL
Anion gap: 12 (ref 5–15)
Anion gap: 10 (ref 5–15)
BUN: 17 mg/dL (ref 6–20)
BUN: 19 mg/dL (ref 6–20)
CO2: 21 mmol/L — ABNORMAL LOW (ref 22–32)
CO2: 21 mmol/L — ABNORMAL LOW (ref 22–32)
Calcium: 9.1 mg/dL (ref 8.9–10.3)
Calcium: 8.9 mg/dL (ref 8.9–10.3)
Chloride: 108 mmol/L (ref 101–111)
Chloride: 109 mmol/L (ref 101–111)
Creatinine, Ser: 0.98 mg/dL (ref 0.44–1.00)
Creatinine, Ser: 0.95 mg/dL (ref 0.44–1.00)
GFR calc Af Amer: >60 mL/min (ref >60)
GFR calc Af Amer: >60 mL/min (ref >60)
GFR calc non Af Amer: >60 mL/min (ref >60)
GFR calc non Af Amer: >60 mL/min (ref >60)
Glucose, Bld: 104 mg/dL — ABNORMAL HIGH (ref 65–99)
Glucose, Bld: 99 mg/dL (ref 65–99)
Potassium: 3.5 mmol/L (ref 3.5–5.1)
Potassium: 3.7 mmol/L (ref 3.5–5.1)
Sodium: 141 mmol/L (ref 135–145)
Sodium: 140 mmol/L (ref 135–145)

## 2016-08-04 LAB — BRAIN NATRIURETIC PEPTIDE: B Natriuretic Peptide: 22.3 pg/mL (ref 0.0–100.0)

## 2016-08-04 LAB — TROPONIN I
Troponin I: <0.03 ng/mL (ref <0.03)
Troponin I: <0.03 ng/mL (ref <0.03)
Troponin I: <0.03 ng/mL (ref <0.03)
Troponin I: <0.03 ng/mL (ref <0.03)

## 2016-08-04 LAB — CBC
HCT: 36.7 % (ref 36.0–46.0)
Hemoglobin: 11.4 g/dL — ABNORMAL LOW (ref 12.0–15.0)
MCH: 26.2 pg (ref 26.0–34.0)
MCHC: 31.1 g/dL (ref 30.0–36.0)
MCV: 84.4 fL (ref 78.0–100.0)
Platelets: 193 10*3/uL (ref 150–400)
RBC: 4.35 MIL/uL (ref 3.87–5.11)
RDW: 15.5 % (ref 11.5–15.5)
WBC: 6.4 10*3/uL (ref 4.0–10.5)

## 2016-08-04 LAB — TSH: TSH: 3.383 u[IU]/mL (ref 0.350–4.500)

## 2016-08-04 LAB — RAPID URINE DRUG SCREEN, HOSP PERFORMED
Amphetamines: NOT DETECTED
Barbiturates: NOT DETECTED
Benzodiazepines: NOT DETECTED
Cocaine: NOT DETECTED
Opiates: POSITIVE — AB
Tetrahydrocannabinol: NOT DETECTED

## 2016-08-04 LAB — HIV ANTIBODY (ROUTINE TESTING W REFLEX): HIV Screen 4th Generation wRfx: NONREACTIVE

## 2016-08-04 LAB — HCG, QUANTITATIVE, PREGNANCY: hCG, Beta Chain, Quant, S: 1 m[IU]/mL (ref <5)

## 2016-08-04 LAB — MRSA PCR SCREENING: MRSA by PCR: NEGATIVE

## 2016-08-04 MED ORDER — NITROGLYCERIN 0.2 MG/HR TD PT24
0.2000 mg | MEDICATED_PATCH | Freq: Every day | TRANSDERMAL | Status: DC
  Administered 2016-08-04: 0.2 mg via TRANSDERMAL
  Filled 2016-08-04: qty 1

## 2016-08-04 MED ORDER — MORPHINE SULFATE (PF) 4 MG/ML IV SOLN: 2.0000 mg | INTRAVENOUS | Status: DC | PRN

## 2016-08-04 MED ORDER — FUROSEMIDE 10 MG/ML IJ SOLN
40.0000 mg | Freq: Once | INTRAMUSCULAR | Status: AC
  Administered 2016-08-04: 40 mg via INTRAVENOUS
  Filled 2016-08-04: qty 4

## 2016-08-04 MED ORDER — MOMETASONE FURO-FORMOTEROL FUM 100-5 MCG/ACT IN AERO
2.0000 | INHALATION_SPRAY | Freq: Two times a day (BID) | RESPIRATORY_TRACT | Status: DC
  Administered 2016-08-04 – 2016-08-05 (×3): 2 via RESPIRATORY_TRACT
  Filled 2016-08-04: qty 8.8

## 2016-08-04 MED ORDER — FLUTICASONE PROPIONATE 50 MCG/ACT NA SUSP: 1.0000 | Freq: Two times a day (BID) | NASAL | Status: DC | PRN

## 2016-08-04 MED ORDER — MORPHINE SULFATE (PF) 4 MG/ML IV SOLN
4.0000 mg | Freq: Once | INTRAVENOUS | Status: AC
  Administered 2016-08-04: 4 mg via INTRAVENOUS
  Filled 2016-08-04: qty 1

## 2016-08-04 MED ORDER — ACETAMINOPHEN 325 MG PO TABS: 650.0000 mg | ORAL_TABLET | ORAL | Status: DC | PRN

## 2016-08-04 MED ORDER — PANTOPRAZOLE SODIUM 40 MG PO TBEC
40.0000 mg | DELAYED_RELEASE_TABLET | Freq: Two times a day (BID) | ORAL | Status: DC
  Administered 2016-08-04 – 2016-08-05 (×2): 40 mg via ORAL
  Filled 2016-08-04 (×2): qty 1

## 2016-08-04 MED ORDER — LOSARTAN POTASSIUM 50 MG PO TABS
100.0000 mg | ORAL_TABLET | Freq: Every day | ORAL | Status: DC
  Administered 2016-08-04 – 2016-08-05 (×2): 100 mg via ORAL
  Filled 2016-08-04 (×2): qty 2

## 2016-08-04 MED ORDER — FAMOTIDINE 20 MG PO TABS
20.0000 mg | ORAL_TABLET | Freq: Two times a day (BID) | ORAL | Status: DC
  Administered 2016-08-04: 20 mg via ORAL
  Filled 2016-08-04: qty 1

## 2016-08-04 MED ORDER — POTASSIUM CHLORIDE CRYS ER 20 MEQ PO TBCR
40.0000 meq | EXTENDED_RELEASE_TABLET | Freq: Once | ORAL | Status: AC
  Administered 2016-08-04: 40 meq via ORAL
  Filled 2016-08-04: qty 2

## 2016-08-04 MED ORDER — VENLAFAXINE HCL 75 MG PO TABS
75.0000 mg | ORAL_TABLET | Freq: Two times a day (BID) | ORAL | Status: DC
  Administered 2016-08-04 – 2016-08-05 (×3): 75 mg via ORAL
  Filled 2016-08-04 (×4): qty 1

## 2016-08-04 MED ORDER — ENOXAPARIN SODIUM 40 MG/0.4ML ~~LOC~~ SOLN
40.0000 mg | Freq: Every day | SUBCUTANEOUS | Status: DC
  Administered 2016-08-04 – 2016-08-05 (×2): 40 mg via SUBCUTANEOUS
  Filled 2016-08-04 (×2): qty 0.4

## 2016-08-04 MED ORDER — BACLOFEN 10 MG PO TABS: 10.0000 mg | ORAL_TABLET | Freq: Two times a day (BID) | ORAL | Status: DC | PRN

## 2016-08-04 MED ORDER — ONDANSETRON 4 MG PO TBDP
4.0000 mg | ORAL_TABLET | Freq: Once | ORAL | Status: AC
  Administered 2016-08-04: 4 mg via ORAL
  Filled 2016-08-04: qty 1

## 2016-08-04 MED ORDER — ONDANSETRON HCL 4 MG/2ML IJ SOLN: 4.0000 mg | Freq: Four times a day (QID) | INTRAMUSCULAR | Status: DC | PRN

## 2016-08-04 MED ORDER — VERAPAMIL HCL 120 MG PO TABS
120.0000 mg | ORAL_TABLET | Freq: Three times a day (TID) | ORAL | Status: DC
  Administered 2016-08-04 – 2016-08-05 (×4): 120 mg via ORAL
  Filled 2016-08-04 (×5): qty 1

## 2016-08-04 MED ORDER — ISOSORBIDE MONONITRATE ER 30 MG PO TB24
60.0000 mg | ORAL_TABLET | Freq: Two times a day (BID) | ORAL | Status: DC
  Administered 2016-08-04 – 2016-08-05 (×3): 60 mg via ORAL
  Filled 2016-08-04 (×3): qty 2

## 2016-08-04 MED ORDER — FUROSEMIDE 20 MG PO TABS
40.0000 mg | ORAL_TABLET | Freq: Every day | ORAL | Status: DC
  Administered 2016-08-04 – 2016-08-05 (×2): 40 mg via ORAL
  Filled 2016-08-04 (×2): qty 2

## 2016-08-04 MED ORDER — ASPIRIN 81 MG PO CHEW
324.0000 mg | CHEWABLE_TABLET | Freq: Once | ORAL | Status: AC
  Administered 2016-08-04: 324 mg via ORAL
  Filled 2016-08-04: qty 4

## 2016-08-04 MED ORDER — LEVOTHYROXINE SODIUM 50 MCG PO TABS
50.0000 ug | ORAL_TABLET | Freq: Every day | ORAL | Status: DC
  Administered 2016-08-04 – 2016-08-05 (×2): 50 ug via ORAL
  Filled 2016-08-04 (×2): qty 1

## 2016-08-04 MED ORDER — DULOXETINE HCL 30 MG PO CPEP
30.0000 mg | ORAL_CAPSULE | Freq: Two times a day (BID) | ORAL | Status: DC
  Administered 2016-08-04 – 2016-08-05 (×3): 30 mg via ORAL
  Filled 2016-08-04 (×4): qty 1

## 2016-08-04 MED ORDER — MONTELUKAST SODIUM 10 MG PO TABS: 10.0000 mg | ORAL_TABLET | Freq: Every day | ORAL | Status: DC | PRN

## 2016-08-04 MED ORDER — CARVEDILOL 12.5 MG PO TABS
12.5000 mg | ORAL_TABLET | Freq: Two times a day (BID) | ORAL | Status: DC
  Administered 2016-08-04 – 2016-08-05 (×3): 12.5 mg via ORAL
  Filled 2016-08-04 (×3): qty 1

## 2016-08-04 MED ORDER — SUCRALFATE 1 G PO TABS
1.0000 g | ORAL_TABLET | Freq: Three times a day (TID) | ORAL | Status: DC
  Administered 2016-08-04 – 2016-08-05 (×4): 1 g via ORAL
  Filled 2016-08-04 (×4): qty 1

## 2016-08-04 MED ORDER — GI COCKTAIL ~~LOC~~: 30.0000 mL | Freq: Four times a day (QID) | ORAL | Status: DC | PRN

## 2016-08-04 MED ORDER — TOPIRAMATE 25 MG PO TABS
200.0000 mg | ORAL_TABLET | Freq: Two times a day (BID) | ORAL | Status: DC
  Administered 2016-08-04 – 2016-08-05 (×3): 200 mg via ORAL
  Filled 2016-08-04 (×3): qty 8

## 2016-08-04 MED ORDER — PANTOPRAZOLE SODIUM 40 MG PO TBEC
40.0000 mg | DELAYED_RELEASE_TABLET | Freq: Every day | ORAL | Status: DC
  Administered 2016-08-04: 40 mg via ORAL
  Filled 2016-08-04: qty 1

## 2016-08-04 MED ORDER — TRAZODONE HCL 50 MG PO TABS
50.0000 mg | ORAL_TABLET | Freq: Every day | ORAL | Status: DC
  Administered 2016-08-04: 50 mg via ORAL
  Filled 2016-08-04: qty 1

## 2016-08-04 NOTE — ED Notes (Signed)
Pt c/o 7/10 chest pain, admitting MD paged.

## 2016-08-04 NOTE — ED Provider Notes (Signed)
By signing my name below, I, Hansel Feinstein, attest that this documentation has been prepared under the direction and in the presence of [place], DO. Electronically Signed: Hansel Feinstein, ED Scribe. 08/04/16. 2:49 AM.    TIME SEEN: 2:39 AM   CHIEF COMPLAINT:  Chief Complaint  Patient presents with  . Chest Pain     HPI:  HPI Comments: [name] is a 51 y.o. 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 Pt reports associated nausea, diaphoresis, SOB. Pt describes her pain as squeezing and pressure, noting that it is worsened with exertion. She states she has taken 6 NTG and used NTG patches with no relief. Pt states she wears NTG patches daily for chronic CP and states her current pain is more severe. States normally her chest pain resolves with nitroglycerin. No h/o cardiac stents. Last cardiac cath of right heart was 06/01/16. She reports last left heart cardiac cath  was in 2014 in [place]. No h/o DM, HLD. Pt is a non-smoker, never smoker. Pt reports FHx of MI from mother at 60 and brother at 76. She denies additional complaints.   ROS: See HPI Constitutional: no fever. +diaphoresis   Eyes: no drainage  ENT: no runny nose   Cardiovascular:  + chest pain  Resp: +SOB  GI: no vomiting. +nausea GU: no dysuria Integumentary: no rash  Allergy: no hives  Musculoskeletal: no leg swelling  Neurological: no slurred speech ROS otherwise negative  PAST MEDICAL HISTORY/PAST SURGICAL HISTORY:  Past Medical History:  Diagnosis Date  . Asthma   . Biliary dyskinesia    a. s/p cholecystectomy.  . 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 Abnl stress [place] 2008, f/u cath reportedly nl. b. ETT-Myoview 04/2011 - EKG changes but normal perfusion. Cor CT - no coronary calcium, no definite  stenosis though mRCA not fully evaluated. c. 10/2011 - tn elevated in [place], LHC without CAD. Started on Ranexa, anti-anginals ?microvascular dz but later stopped while in hospital on abx.  . Complication of anesthesia    hard to wake up-had to be reminded to breath  . GERD (gastroesophageal reflux disease)    a. Severe.  Marland Kitchen HTN (hypertension)   . Hx of cardiovascular stress test    Lex Myoview 8/14:  Normal, EF 74%  . Hx of echocardiogram    Echo 3/16:  Mild LVH, EF 55-60%, Gr 1 DD, trivial MR, mild LAE, normal RVF  . Hypothyroid   . MI (myocardial infarction)   . Migraine   . MRSA infection    a. After vagal nerve stimulator at [place] - surgical site MRSA infection, PICC placed.  . Obesity   . Palpitations    a. 08/2014: 48 hour holter with 2 PVCs otherwise normal.  . Seizure disorder ([place])    a. since childhood. b. s/p vagal nerve stimulator at [facility].  . Seizures ([place])     MEDICATIONS:  Prior to Admission medications   Medication Sig Start Date End Date Taking? Authorizing Provider  acetaminophen (TYLENOL) 325 MG tablet Take 975 mg by mouth every 6 (six) hours as needed for headache.    Historical Provider, MD  albuterol (PROAIR HFA) 108 (90 BASE) MCG/ACT inhaler Inhale 2 puffs into the lungs every 4 (four) hours as needed for wheezing or  shortness of breath.     Historical Provider, MD  baclofen (LIORESAL) 10 MG tablet Take 10 mg by mouth 2 (two) times daily as needed for muscle spasms.  05/16/16   Historical Provider, MD  carvedilol (COREG) 12.5 MG tablet Take 1 tablet (12.5 mg total) by mouth 2 (two) times daily. 05/24/16 08/22/16  Larey Dresser, MD  DULoxetine (CYMBALTA) 30 MG capsule Take 30-60 capsules by mouth 2 (two) times daily as needed (for back pain).  05/15/16   Historical Provider, MD  fluticasone (FLONASE) 50 MCG/ACT nasal spray Place 1 spray into both nostrils 2 (two) times daily as needed for allergies.  04/17/14   Historical Provider, MD  furosemide (LASIX) 40 MG  tablet Take 40 mg by mouth daily.  05/15/16   Historical Provider, MD  isosorbide mononitrate (IMDUR) 60 MG 24 hr tablet TAKE 1 TABLET (60 MG TOTAL) BY MOUTH 2 (TWO) TIMES DAILY. 11/11/14   Larey Dresser, MD  levothyroxine (SYNTHROID, LEVOTHROID) 50 MCG tablet Take 50 mcg by mouth daily.     Historical Provider, MD  losartan (COZAAR) 100 MG tablet Take 1 tablet (100 mg total) by mouth daily. 03/28/16   Brittainy Erie Noe, PA-C  metoprolol tartrate (LOPRESSOR) 25 MG tablet Take 50 mg by mouth 2 (two) times daily.    Historical Provider, MD  mometasone-formoterol (DULERA) 100-5 MCG/ACT AERO Inhale 2 puffs into the lungs 2 (two) times daily. 07/09/15   Historical Provider, MD  montelukast (SINGULAIR) 10 MG tablet Take 10 mg by mouth daily as needed (for allergies).  04/17/14   Historical Provider, MD  naproxen (NAPROSYN) 500 MG tablet Take 500 mg by mouth twice daily as needed for pain 04/07/16   Historical Provider, MD  nitroGLYCERIN (NITRODUR - DOSED IN MG/24 HR) 0.2 mg/hr patch PLACE 1 PATCH ONTO THE SKIN DAILY. 09/08/14   Larey Dresser, MD  nitroGLYCERIN (NITROSTAT) 0.4 MG SL tablet PLACE 1 TABLET (0.4 MG TOTAL) UNDER THE TONGUE EVERY 5 (FIVE) MINUTES AS NEEDED FOR CHEST PAIN. 09/28/15   Larey Dresser, MD  omeprazole (PRILOSEC) 40 MG capsule Take 40 mg by mouth daily.  11/15/14   Historical Provider, MD  ondansetron (ZOFRAN) 4 MG tablet Take 1 tablet (4 mg total) by mouth every 6 (six) hours. 06/23/16   Tiffany Carlota Raspberry, PA-C  promethazine (PHENERGAN) 25 MG tablet Take 25 mg by mouth every 4 (four) hours as needed for nausea or vomiting.     Historical Provider, MD  ranitidine (ZANTAC) 150 MG tablet Take 150 mg by mouth 2 (two) times daily. 05/02/14   Historical Provider, MD  sucralfate (CARAFATE) 1 g tablet Take 1 g by mouth 4 (four) times daily. 06/09/16   Historical Provider, MD  topiramate (TOPAMAX) 100 MG tablet Take 200 mg by mouth 2 (two) times daily.    Historical Provider, MD  traZODone (DESYREL)  50 MG tablet Take 50 mg by mouth at bedtime.    Historical Provider, MD  trimethoprim-polymyxin b (POLYTRIM) ophthalmic solution Place 1 drop into both eyes 4 (four) times daily. For 5 days 04/17/16   Melony Overly, MD  venlafaxine [facility]) 75 MG tablet Take 75 mg by mouth 2 (two) times daily.    Historical Provider, MD  verapamil (CALAN) 120 MG tablet Take 120 mg by mouth 3 (three) times daily. 02/05/15   Historical Provider, MD  Vitamin D, Ergocalciferol, (DRISDOL) 50000 units CAPS capsule Take 50000 units once weekly on monday 03/09/16   Historical Provider, MD  ALLERGIES:  Allergies  Allergen Reactions  . Amantadines Swelling  . Amitriptyline Swelling  . Amlodipine Swelling  . Aspirin Nausea Only    Irritates stomach also/hx of stomach ulcers  . Latex Hives, Itching and Swelling    Burning also  . Simvastatin Swelling    Muscle pains also  . Tramadol Hives    SOCIAL HISTORY:  Social History  Substance Use Topics  . Smoking status: Never Smoker  . Smokeless tobacco: Never Used  . Alcohol use 0.0 oz/week    1 - 3 Glasses of wine per week     Comment: occasional    FAMILY HISTORY: Family History  Problem Relation Age of Onset  . Coronary artery disease Mother 21  . Coronary artery disease Maternal Grandmother   . Cancer Other   . Hypertension Other   . Stroke Other     EXAM: BP 108/69 (BP Location: Right Arm)   Pulse 88   Temp 97.9 F (36.6 C) (Oral)   Resp 22   Ht 5\' 5"  (1.651 m)   Wt 275 lb (124.7 kg)   LMP 05/20/2016 (Exact Date)   SpO2 98%   BMI 45.76 kg/m  CONSTITUTIONAL: Alert and oriented and responds appropriately to questions. Well-nourished. Morbidly obese. Appears uncomfortable.  HEAD: Normocephalic EYES: Conjunctivae clear, PERRL, EOMI ENT: normal nose; no rhinorrhea; moist mucous membranes NECK: Supple, no meningismus, no nuchal rigidity, no LAD  CARD: RRR; S1 and S2 appreciated; no murmurs, no clicks, no rubs, no gallops RESP: Normal chest  excursion without splinting or tachypnea; breath sounds clear and equal bilaterally; no wheezes, no rhonchi, no rales, no hypoxia or respiratory distress, speaking full sentences ABD/GI: Normal bowel sounds; non-distended; soft, non-tender, no rebound, no guarding, no peritoneal signs, no hepatosplenomegaly BACK:  The back appears normal and is non-tender to palpation, there is no CVA tenderness EXT: Normal ROM in all joints; non-tender to palpation; no edema; normal capillary refill; no cyanosis, no calf tenderness or swelling    SKIN: Normal color for age and race; warm; no rash NEURO: Moves all extremities equally, sensation to light touch intact diffusely, cranial nerves II through XII intact, normal speech PSYCH: The patient's mood and manner are appropriate. Grooming and personal hygiene are appropriate.  MEDICAL DECISION MAKING: Patient here chest pain. Last cardiac catheterization of the left heart was in 2014 per her report. She did have low risk stress tests in October has concerning complaints of exertional chest pain with diaphoresis, shortness of breath, nausea today. Pain unrelieved with 6 nitroglycerin at home. We'll give morphine. We'll give aspirin. First troponin is negative. Heart score is 4. Have recommended admission for chest pain rule out. PCP is in [facility]. Cardiologist is Dr. [name].  ED PROGRESS: 3:10 AM  D/w Dr. Tamala Julian with hospitalist service who will see the patient for admission. Patient has been updated with plan. Appreciate hospitalist help.   I reviewed all nursing notes, vitals, pertinent old records, EKGs, labs, imaging (as available).     EKG Interpretation  Date/Time:  Thursday August 03 2016 23:06:19 EST Ventricular Rate:  93 PR Interval:  126 QRS Duration: 88 QT Interval:  360 QTC Calculation: 447 R Axis:   36 Text Interpretation:  Normal sinus rhythm Minimal voltage criteria for LVH, may be normal variant Septal infarct , age  undetermined T wave abnormality, consider inferior ischemia Abnormal ECG When compared with ECG of 06/01/2016, No significant change was found T wave abnormality secondary to LVH Confirmed by [facility]  MD, DAVID ([id number]) on 08/03/2016 11:44:50 PM        I personally performed the services described in this documentation, which was scribed in my presence. The recorded information has been reviewed and is accurate.    [place], DO 08/04/16 [phone]

## 2016-08-04 NOTE — H&P (Addendum)
History and Physical    [name] [id number] DOB: 11/06/1965 DOA: 08/04/2016  Referring MD/NP/PA: Dr. Leonides Schanz PCP: Mackie Pai, MD  Patient coming from: Home  Chief Complaint: Chest Pain  HPI: [name] is a 51 y.o. female with medical history significant of HTN, HLDchronic chest pain , MI, hypothyroidism, diastolic CHF EF [id number] with grade 2 dFx; who presents with complaints of waxing and waning substernal chest pain since yesterday morning.  Symptoms started while patient was at rest and radiated into her face and her right arm. Rated pain a 10 out of 10 on the pain scale. Patient notes any physical activity or exertion worsened symptoms. Associated symptoms included shortness of breath, diaphoresis, nausea, vomiting, leg swelling, and lightheadedness. Patient reports taking a total of 6 nitroglycerin tablets intermittently throughout the day as well as placingon a nitroglycerin patch with only temporary relief of symptoms. Patient reports pain never totally resolved. Previous, chest pain symptoms would resolve completely with nitroglycerin. Admission reports checking her weight intermittently but is not sure if she's had any weight gain. She also reports taking her diuretic due to the lower leg swelling noted. Denies any palpitations, loss of consciousness, diarrhea, fever, strenuous activity/heavy lifting, recent sick contacts, recent travel, or calf pain. Followed by Dr. [name] of cardiologist. She just underwent right heart cath procedure on 06/01/2016. Procedure showed patient to have normal cardiac output with normal right and left heart filling pressures and no pulmonary hypertension. Her last echocardiogram was on 03/2016 with EF of 60-65% and grade 2 diastolic dysfunction. Patient denies any tobacco or illicit drug use. She also has a significant family history of MI in mother and brother before the age of 37.   ED Course: Upon admission into the emergency department patient  was seen to be afebrile with all other vitals relatively within normal limits. Lab work revealed negative initial troponin. EKG showed no significant signs of ischemia. Chest x-ray showed no acute pulmonary disease. Patient was given morphine and aspirin in the ED pain reported as a 3 out of 10 on pain scale.  Review of Systems: As per HPI otherwise 10 point review of systems negative.   Past Medical History:  Diagnosis Date  . Asthma   . Biliary dyskinesia    a. s/p cholecystectomy.  . 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 Abnl stress [place] 2008, f/u cath reportedly nl. b. ETT-Myoview 04/2011 - EKG changes but normal perfusion. Cor CT - no coronary calcium, no definite stenosis though mRCA not fully evaluated. c. 10/2011 - tn elevated in [place], LHC without CAD. Started on Ranexa, anti-anginals ?microvascular dz but later stopped while in hospital on abx.  . Complication of anesthesia    hard to wake up-had to be reminded to breath  . GERD (gastroesophageal reflux disease)    a. Severe.  Marland Kitchen HTN (hypertension)   . Hx of cardiovascular stress test    [facility] Myoview 8/14:  Normal, EF 74%  . Hx of echocardiogram    Echo 3/16:  Mild LVH, EF 55-60%, Gr 1 DD, trivial MR, mild LAE, normal RVF  . Hypothyroid   . MI (myocardial infarction)   . Migraine   . MRSA infection    a. After vagal nerve stimulator at [facility] - surgical site MRSA infection, PICC placed.  . Obesity   . Palpitations    a. 08/2014: 48 hour holter with 2 PVCs otherwise normal.  . Seizure disorder ([place])  a. since childhood. b. s/p vagal nerve stimulator at [facility].  . Seizures ([place])     Past Surgical History:  Procedure Laterality Date  . [place] N/A 06/01/2016   Procedure: Right Heart Cath;  Surgeon: Larey Dresser, MD;  Location: [facility];  Service: Cardiovascular;  Laterality: N/A;  . CESAREAN SECTION      placement of vagal nerve stimulator.  . CHOLECYSTECTOMY N/A 01/24/2013   Procedure: LAPAROSCOPIC CHOLECYSTECTOMY;  Surgeon: Harl Bowie, MD;  Location: [facility];  Service: General;  Laterality: N/A;  . IMPLANTATION VAGAL NERVE STIMULATOR  [phone]   battery [facility]     reports that she has never smoked. She has never used smokeless tobacco. She reports that she drinks alcohol. She reports that she does not use drugs.  Allergies  Allergen Reactions  . Amantadines Swelling  . Amitriptyline Swelling  . Amlodipine Swelling  . Aspirin Nausea Only    Irritates stomach also/hx of stomach ulcers  . Latex Hives, Itching and Swelling    Burning also  . Simvastatin Swelling    Muscle pains also  . Tramadol Hives    Family History  Problem Relation Age of Onset  . Coronary artery disease Mother 73  . Coronary artery disease Maternal Grandmother   . Cancer Other   . Hypertension Other   . Stroke Other     Prior to Admission medications   Medication Sig Start Date End Date Taking? Authorizing Provider  acetaminophen (TYLENOL) 325 MG tablet Take 975 mg by mouth every 6 (six) hours as needed for headache.    Historical Provider, MD  albuterol (PROAIR HFA) 108 (90 BASE) MCG/ACT inhaler Inhale 2 puffs into the lungs every 4 (four) hours as needed for wheezing or shortness of breath.     Historical Provider, MD  baclofen (LIORESAL) 10 MG tablet Take 10 mg by mouth 2 (two) times daily as needed for muscle spasms.  05/16/16   Historical Provider, MD  carvedilol (COREG) 12.5 MG tablet Take 1 tablet (12.5 mg total) by mouth 2 (two) times daily. 05/24/16 08/22/16  Larey Dresser, MD  DULoxetine (CYMBALTA) 30 MG capsule Take 30-60 capsules by mouth 2 (two) times daily as needed (for back pain).  05/15/16   Historical Provider, MD  fluticasone (FLONASE) 50 MCG/ACT nasal spray Place 1 spray into both nostrils 2 (two) times daily as needed for allergies.  04/17/14   Historical  Provider, MD  furosemide (LASIX) 40 MG tablet Take 40 mg by mouth daily.  05/15/16   Historical Provider, MD  isosorbide mononitrate (IMDUR) 60 MG 24 hr tablet TAKE 1 TABLET (60 MG TOTAL) BY MOUTH 2 (TWO) TIMES DAILY. 11/11/14   Larey Dresser, MD  levothyroxine (SYNTHROID, LEVOTHROID) 50 MCG tablet Take 50 mcg by mouth daily.     Historical Provider, MD  losartan (COZAAR) 100 MG tablet Take 1 tablet (100 mg total) by mouth daily. 03/28/16   Brittainy Erie Noe, PA-C  metoprolol tartrate (LOPRESSOR) 25 MG tablet Take 50 mg by mouth 2 (two) times daily.    Historical Provider, MD  mometasone-formoterol (DULERA) 100-5 MCG/ACT AERO Inhale 2 puffs into the lungs 2 (two) times daily. 07/09/15   Historical Provider, MD  montelukast (SINGULAIR) 10 MG tablet Take 10 mg by mouth daily as needed (for allergies).  04/17/14   Historical Provider, MD  naproxen (NAPROSYN) 500 MG tablet Take 500 mg by  mouth twice daily as needed for pain 04/07/16   Historical Provider, MD  nitroGLYCERIN (NITRODUR - DOSED IN MG/24 HR) 0.2 mg/hr patch PLACE 1 PATCH ONTO THE SKIN DAILY. 09/08/14   Larey Dresser, MD  nitroGLYCERIN (NITROSTAT) 0.4 MG SL tablet PLACE 1 TABLET (0.4 MG TOTAL) UNDER THE TONGUE EVERY 5 (FIVE) MINUTES AS NEEDED FOR CHEST PAIN. 09/28/15   Larey Dresser, MD  omeprazole (PRILOSEC) 40 MG capsule Take 40 mg by mouth daily.  11/15/14   Historical Provider, MD  ondansetron (ZOFRAN) 4 MG tablet Take 1 tablet (4 mg total) by mouth every 6 (six) hours. 06/23/16   Tiffany Carlota Raspberry, PA-C  promethazine (PHENERGAN) 25 MG tablet Take 25 mg by mouth every 4 (four) hours as needed for nausea or vomiting.     Historical Provider, MD  ranitidine (ZANTAC) 150 MG tablet Take 150 mg by mouth 2 (two) times daily. 05/02/14   Historical Provider, MD  sucralfate (CARAFATE) 1 g tablet Take 1 g by mouth 4 (four) times daily. 06/09/16   Historical Provider, MD  topiramate (TOPAMAX) 100 MG tablet Take 200 mg by mouth 2 (two) times daily.     Historical Provider, MD  traZODone (DESYREL) 50 MG tablet Take 50 mg by mouth at bedtime.    Historical Provider, MD  trimethoprim-polymyxin b (POLYTRIM) ophthalmic solution Place 1 drop into both eyes 4 (four) times daily. For 5 days 04/17/16   Melony Overly, MD  venlafaxine [facility]) 75 MG tablet Take 75 mg by mouth 2 (two) times daily.    Historical Provider, MD  verapamil (CALAN) 120 MG tablet Take 120 mg by mouth 3 (three) times daily. 02/05/15   Historical Provider, MD  Vitamin D, Ergocalciferol, (DRISDOL) 50000 units CAPS capsule Take 50000 units once weekly on monday 03/09/16   Historical Provider, MD    Physical Exam:  Constitutional: Morbidly obese female NAD, calm, comfortable Vitals:   08/03/16 2309 08/04/16 0158 08/04/16 0234 08/04/16 0300  BP: 147/76 113/64 108/69 139/61  Pulse: 90 80 88 79  Resp: 20 18 22 15   Temp: 97.6 F (36.4 C) 97.9 F (36.6 C)    TempSrc: Oral Oral    SpO2: 97% 99% 98% 98%  Weight:      Height:       Eyes: PERRL, lids and conjunctivae normal ENMT: Mucous membranes are moist. Posterior pharynx clear of any exudate or lesions.Normal dentition.  Neck: normal, supple, no masses, no thyromegaly Respiratory: clear to auscultation bilaterally, no wheezing, no crackles. Normal respiratory effort. No accessory muscle use.  Cardiovascular: Regular rate and rhythm, no murmurs / rubs / gallops.+1 pitting b/l lower extremity edema. 2+ pedal pulses. No carotid bruits.  Mild tenderness to palpation of the chest wall noted. Abdomen: no tenderness, no masses palpated. No hepatosplenomegaly. Bowel sounds positive.  Musculoskeletal: no clubbing / cyanosis. No joint deformity upper and lower extremities. Good ROM, no contractures. Normal muscle tone.  Skin: no rashes, lesions, ulcers. No induration Neurologic: CN 2-12 grossly intact. Sensation intact, DTR normal. Strength 5/5 in all 4.  Psychiatric: Normal judgment and insight. Alert and oriented x 3. Normal mood.      Labs on Admission: I have personally reviewed following labs and imaging studies  CBC:  Recent Labs Lab 08/03/16 2314  WBC 7.4  HGB 12.1  HCT 39.1  MCV 84.4  PLT [id number]   Basic Metabolic Panel:  Recent Labs Lab 08/03/16 2314  NA 141  K 3.5  CL 108  CO2 21*  GLUCOSE 104*  BUN 17  CREATININE 0.98  CALCIUM 9.1   GFR: Estimated Creatinine Clearance: 89.2 mL/min (by C-G formula based on SCr of 0.98 mg/dL). Liver Function Tests: No results for input(s): AST, ALT, ALKPHOS, BILITOT, PROT, ALBUMIN in the last 168 hours. No results for input(s): LIPASE, AMYLASE in the last 168 hours. No results for input(s): AMMONIA in the last 168 hours. Coagulation Profile: No results for input(s): INR, PROTIME in the last 168 hours. Cardiac Enzymes:  Recent Labs Lab 08/03/16 2314  TROPONINI <0.03   BNP (last 3 results) No results for input(s): PROBNP in the last 8760 hours. HbA1C: No results for input(s): HGBA1C in the last 72 hours. CBG: No results for input(s): GLUCAP in the last 168 hours. Lipid Profile: No results for input(s): CHOL, HDL, LDLCALC, TRIG, CHOLHDL, LDLDIRECT in the last 72 hours. Thyroid Function Tests: No results for input(s): TSH, T4TOTAL, FREET4, T3FREE, THYROIDAB in the last 72 hours. Anemia Panel: No results for input(s): VITAMINB12, FOLATE, FERRITIN, TIBC, IRON, RETICCTPCT in the last 72 hours. Urine analysis:    Component Value Date/Time   COLORURINE YELLOW 06/23/2016 [place] 06/23/2016 1143   LABSPEC 1.017 06/23/2016 1143   PHURINE 6.0 06/23/2016 1143   GLUCOSEU NEGATIVE 06/23/2016 1143   HGBUR MODERATE (A) 06/23/2016 1143   BILIRUBINUR NEGATIVE 06/23/2016 1143   KETONESUR NEGATIVE 06/23/2016 1143   PROTEINUR NEGATIVE 06/23/2016 1143   UROBILINOGEN 1.0 12/15/2011 1236   NITRITE NEGATIVE 06/23/2016 1143   LEUKOCYTESUR NEGATIVE 06/23/2016 1143   Sepsis Labs: No results found for this or any previous visit (from the past 240  hour(s)).   Radiological Exams on Admission: Dg Chest 2 View  Result Date: 08/03/2016 CLINICAL DATA:  Acute onset of generalized chest pain and nausea. Right arm pain. Initial encounter. EXAM: CHEST  2 VIEW COMPARISON:  Chest radiograph performed 12/19/2013 FINDINGS: The lungs are well-aerated and clear. There is no evidence of focal opacification, pleural effusion or pneumothorax. The heart is normal in size; the mediastinal contour is within normal limits. No acute osseous abnormalities are seen. A metallic device is noted overlying the left chest wall. Clips are noted within the right upper quadrant, reflecting prior cholecystectomy. IMPRESSION: No acute cardiopulmonary process seen. Electronically Signed   By: Garald Balding M.D.   On: 08/03/2016 23:53    EKG: Independently reviewed. Normal sinus rhythm  Assessment/Plan Chest pain: Patient presents with chest pain without relief with multiple doses of nitroglycerin. Heart Score = 4. Patient with recent normal right heart cath. Followed in outpatient setting by Dr. [name]. Differential includes angina versus GERD related chest pain versus other noncardiac chest pain (costochondritis). - Admit to a stepdown bed for persistent chest pain - Chest pain order set initiated - Trend cardiac enzymes - Check UDS - Notify patient's cardiologist Dr. [name] in a.m.  Nausea and vomiting - Zofran prn N/V  Diastolic CHF: Last echocardiogram performed in 03/2016 Patient has 1+ pitting edema of the bilateral lower extremities but does not appear to be acutely fluid overloaded at this time. - Strict I&O's and daily weights  - Check BNP - Continue Coreg, furosemide, isosorbide mononitrate  Lower extremity edema  - Elevate legs  Essential hypertension - Continue losartan, verapamil   Chronic pain - Continue Cymbalta, Effexor    Asthma - Continue Singulair, Dulera, Flonase prn allergies  Hypothyroidism - Check TSH  - Continue  levothyroxine  Morbid obesity  GERD: Patient ran out of prescription for Prilosec and ranitidine -  Continue Carafate, Pharmacy substitution of Protonix for omeprazole, and Pepcid for Zantac   DVT prophylaxis: lovenox Code Status: Full Family Communication: No family present at bedside  Disposition Plan: Likely discharge home in medically stable and workup negative Consults called: None Admission status: Observation  Norval Morton MD [facility] Pager [phone]  If 7PM-7AM, please contact night-coverage [email or web address] Password [facility]  08/04/2016, 3:19 AM

## 2016-08-04 NOTE — Progress Notes (Signed)
PROGRESS NOTE  [name]  JJO:[id number] DOB: July 05, 1965 DOA: 08/04/2016 PCP: Maisie Fus, MD Outpatient Specialists:  Subjective: Patient was still complaining about 4/10 chest pain, has some chest tenderness. Cardiac enzymes negative, 12-lead EKG no changes.  Brief Narrative:  [name] is a 51 y.o. female with medical history significant of HTN, HLDchronic chest pain , MI, hypothyroidism, diastolic CHF EF 60-65% with grade 2 dFx; who presents with complaints of waxing and waning substernal chest pain since yesterday morning.  Symptoms started while patient was at rest and radiated into her face and her right arm. Rated pain a 10 out of 10 on the pain scale. Patient notes any physical activity or exertion worsened symptoms. Associated symptoms included shortness of breath, diaphoresis, nausea, vomiting, leg swelling, and lightheadedness. Patient   Assessment & Plan:   Principal Problem:   Chest pain Active Problems:   GERD (gastroesophageal reflux disease)   Hypothyroidism   Chronic diastolic heart failure (HCC)   Asthma   BMI 40.0-44.9, adult (HCC)   Severe persistent asthma   Chronic pain syndrome   Chest pain -Patient presents with chest pain without relief with multiple doses of nitroglycerin.  -Heart Score = 4. Patient with recent normal right heart cath. Followed in outpatient setting by Dr. Shirlee Latch. Differential includes angina versus GERD related chest pain versus other noncardiac chest pain (costochondritis). -Admitted stepdown, cardiology consulted, did not receive anticoagulation this morning. -Negative CXR, continue aspirin and beta blockers  Nausea and vomiting - Zofran prn N/V, this is improved.  Diastolic CHF:  -Last echocardiogram performed in 03/2016 Patient has 1+ pitting edema of the bilateral lower extremities but does not appear to be acutely fluid overloaded at this time. -BNP is not elevated -Continue Coreg, furosemide, isosorbide  mononitrate  Lower extremity edema  - Elevate legs  Essential hypertension - Continue losartan, verapamil   Chronic pain - Continue Cymbalta, Effexor    Asthma - Continue Singulair, Dulera, Flonase prn allergies  Hypothyroidism - TSH is 3.383 - Continue levothyroxine  GERD:  -Patient ran out of prescription for Prilosec and ranitidine -Continue Carafate, Pharmacy substitution of Protonix for omeprazole, and Pepcid for Zantac    DVT prophylaxis: Lovenox Code Status: Full Code Family Communication:  Disposition Plan:  Diet: Diet NPO time specified  Consultants:   Cardiology  Procedures:   None  Antimicrobials:   None   Objective: Vitals:   08/04/16 0647 08/04/16 0700 08/04/16 0800 08/04/16 1019  BP:  135/82 (!) 143/85 (!) 141/88  Pulse: 97 66 77 77  Resp: 12 19 (!) 21 19  Temp:   97.8 F (36.6 C)   TempSrc:   Oral   SpO2: 100% 97% 95% 97%  Weight:      Height:        Intake/Output Summary (Last 24 hours) at 08/04/16 1105 Last data filed at 08/04/16 1000  Gross per 24 hour  Intake               60 ml  Output                0 ml  Net               60 ml   Filed Weights   08/03/16 2306 08/04/16 0643  Weight: 124.7 kg (275 lb) 131.8 kg (290 lb 9.1 oz)    Examination: General exam: Appears calm and comfortable  Respiratory system: Clear to auscultation. Respiratory effort normal. Cardiovascular system: S1 & S2 heard, RRR.  No JVD, murmurs, rubs, gallops or clicks. No pedal edema. Gastrointestinal system: Abdomen is nondistended, soft and nontender. No organomegaly or masses felt. Normal bowel sounds heard. Central nervous system: Alert and oriented. No focal neurological deficits. Extremities: Symmetric 5 x 5 power. Skin: No rashes, lesions or ulcers Psychiatry: Judgement and insight appear normal. Mood & affect appropriate.   Data Reviewed: I have personally reviewed following labs and imaging studies  CBC:  Recent Labs Lab  08/03/16 2314 08/04/16 0553  WBC 7.4 6.4  HGB 12.1 11.4*  HCT 39.1 36.7  MCV 84.4 84.4  PLT 226 193   Basic Metabolic Panel:  Recent Labs Lab 08/03/16 2314 08/04/16 0553  NA 141 140  K 3.5 3.7  CL 108 109  CO2 21* 21*  GLUCOSE 104* 99  BUN 17 19  CREATININE 0.98 0.95  CALCIUM 9.1 8.9   GFR: Estimated Creatinine Clearance: 95 mL/min (by C-G formula based on SCr of 0.95 mg/dL). Liver Function Tests: No results for input(s): AST, ALT, ALKPHOS, BILITOT, PROT, ALBUMIN in the last 168 hours. No results for input(s): LIPASE, AMYLASE in the last 168 hours. No results for input(s): AMMONIA in the last 168 hours. Coagulation Profile: No results for input(s): INR, PROTIME in the last 168 hours. Cardiac Enzymes:  Recent Labs Lab 08/03/16 2314 08/04/16 0553 08/04/16 0733  TROPONINI <0.03 <0.03 <0.03   BNP (last 3 results) No results for input(s): PROBNP in the last 8760 hours. HbA1C: No results for input(s): HGBA1C in the last 72 hours. CBG: No results for input(s): GLUCAP in the last 168 hours. Lipid Profile: No results for input(s): CHOL, HDL, LDLCALC, TRIG, CHOLHDL, LDLDIRECT in the last 72 hours. Thyroid Function Tests:  Recent Labs  08/04/16 0554  TSH 3.383   Anemia Panel: No results for input(s): VITAMINB12, FOLATE, FERRITIN, TIBC, IRON, RETICCTPCT in the last 72 hours. Urine analysis:    Component Value Date/Time   COLORURINE YELLOW 06/23/2016 1143   APPEARANCEUR CLEAR 06/23/2016 1143   LABSPEC 1.017 06/23/2016 1143   PHURINE 6.0 06/23/2016 1143   GLUCOSEU NEGATIVE 06/23/2016 1143   HGBUR MODERATE (A) 06/23/2016 1143   BILIRUBINUR NEGATIVE 06/23/2016 1143   KETONESUR NEGATIVE 06/23/2016 1143   PROTEINUR NEGATIVE 06/23/2016 1143   UROBILINOGEN 1.0 12/15/2011 1236   NITRITE NEGATIVE 06/23/2016 1143   LEUKOCYTESUR NEGATIVE 06/23/2016 1143   Sepsis Labs: @LABRCNTIP (procalcitonin:4,lacticidven:4)  ) Recent Results (from the past 240 hour(s))  MRSA  PCR Screening     Status: None   Collection Time: 08/04/16  6:41 AM  Result Value Ref Range Status   MRSA by PCR NEGATIVE NEGATIVE Final    Comment:        The GeneXpert MRSA Assay (FDA approved for NASAL specimens only), is one component of a comprehensive MRSA colonization surveillance program. It is not intended to diagnose MRSA infection nor to guide or monitor treatment for MRSA infections.      Invalid input(s): PROCALCITONIN, LACTICACIDVEN   Radiology Studies: Dg Chest 2 View  Result Date: 08/03/2016 CLINICAL DATA:  Acute onset of generalized chest pain and nausea. Right arm pain. Initial encounter. EXAM: CHEST  2 VIEW COMPARISON:  Chest radiograph performed 12/19/2013 FINDINGS: The lungs are well-aerated and clear. There is no evidence of focal opacification, pleural effusion or pneumothorax. The heart is normal in size; the mediastinal contour is within normal limits. No acute osseous abnormalities are seen. A metallic device is noted overlying the left chest wall. Clips are noted within the right upper quadrant, reflecting  prior cholecystectomy. IMPRESSION: No acute cardiopulmonary process seen. Electronically Signed   By: Roanna Raider M.D.   On: 08/03/2016 23:53        Scheduled Meds: . carvedilol  12.5 mg Oral BID WC  . DULoxetine  30 mg Oral BID  . enoxaparin (LOVENOX) injection  40 mg Subcutaneous Daily  . famotidine  20 mg Oral BID  . furosemide  40 mg Oral Daily  . isosorbide mononitrate  60 mg Oral BID  . levothyroxine  50 mcg Oral QAC breakfast  . losartan  100 mg Oral Daily  . mometasone-formoterol  2 puff Inhalation BID  . nitroGLYCERIN  0.2 mg Transdermal Daily  . pantoprazole  40 mg Oral Daily  . sucralfate  1 g Oral TID AC & HS  . topiramate  200 mg Oral BID  . traZODone  50 mg Oral QHS  . venlafaxine  75 mg Oral BID WC  . verapamil  120 mg Oral Q8H   Continuous Infusions:   LOS: 0 days    Time spent: 35 minutes    Talmage Teaster A,  MD [facility] Pager [phone]  If 7PM-7AM, please contact night-coverage [email or web address] Password [email or web address] 08/04/2016, 11:05 AM

## 2016-08-04 NOTE — Consult Note (Signed)
Reason for Consult:   Chest pain  Requesting Physician: [facility] Primary Cardiologist Dr [name]  HPI:   51 y/o morbidly obese AA female, followed by Dr. [name],  who has a h/o CP. Multiple episodes over the years. She has undergone extensive ischemia w/u in the past, that has been fairly normal, including an abnormal stress test in 2008 in [place].  Per her history she went to [facility] for a cardiac catheterization which, per her report, was normal. She had a stress echo at [facility] in 2010 that was submaximal but showed no evidence for ischemia. ETT-myoview (Nov 2012): no evidence for ischemia. Coronary CT angiogram (Nov 2012): calcium score 0, difficult study with gating artifact, mid RCA nonevaluatable but no stenosis elsewhere in the coronaries. She apparently had aNSTEMI in [place] in May 2013. LHC showed normal coronaries. Echo (5/13) with EF 60-65%, normal valve, no LVH. Her last functional study was a [place] Myoview Oct 2017-low risk, no significant ischemia. Echo done Oct 2017 showed EF 60-65% with LVH and grade 2 DD. She had a Rt heart cath Dec 2017-see report below.  The pt was admitted through the ED 2 am with chest pain. The pt says she has been taking her medications at home. Earlier this week she felt week. Yesterday she started having episodic chest pain. She describes episodes of SCCP "pressure and squeezing". She says it radiates up to her jaw and face and down her Rt arm. Her symptoms are associated with nausea, SOB, and "sweating". Her symptoms last about 5 minutes and are worse if she gets up and walks around. NTG SL helps. She also reports LE edema over the past week.   PMHx:  Past Medical History:  Diagnosis Date  . Asthma   . Biliary dyskinesia    a. s/p cholecystectomy.  . 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 Abnl stress [place] 2008, f/u cath reportedly nl. b. ETT-Myoview 04/2011 - EKG changes but normal perfusion. Cor  CT - no coronary calcium, no definite stenosis though mRCA not fully evaluated. c. 10/2011 - tn elevated in [place], LHC without CAD. Started on Ranexa, anti-anginals ?microvascular dz but later stopped while in hospital on abx.  . Complication of anesthesia    hard to wake up-had to be reminded to breath  . GERD (gastroesophageal reflux disease)    a. Severe.  Marland Kitchen HTN (hypertension)   . Hx of cardiovascular stress test    [place] Myoview 8/14:  Normal, EF 74%  . Hx of echocardiogram    Echo 3/16:  Mild LVH, EF 55-60%, Gr 1 DD, trivial MR, mild LAE, normal RVF  . Hypothyroid   . MI (myocardial infarction)   . Migraine   . MRSA infection    a. After vagal nerve stimulator at [facility] - surgical site MRSA infection, PICC placed.  . Obesity   . Palpitations    a. 08/2014: 48 hour holter with 2 PVCs otherwise normal.  . Seizure disorder ([place])    a. since childhood. b. s/p vagal nerve stimulator at [facility].  . Seizures ([place])     Past Surgical History:  Procedure Laterality Date  . [place] N/A 06/01/2016   Procedure: Right Heart Cath;  Surgeon: Larey Dresser, MD;  Location: [place] [facility];  Service: Cardiovascular;  Laterality: N/A;  . CESAREAN SECTION  placement of vagal nerve stimulator.  . CHOLECYSTECTOMY N/A 01/24/2013   Procedure: LAPAROSCOPIC CHOLECYSTECTOMY;  Surgeon: Harl Bowie, MD;  Location: [place];  Service: General;  Laterality: N/A;  . IMPLANTATION VAGAL NERVE STIMULATOR  [phone]   battery chg-[facility]    SOCHx:  reports that she has never smoked. She has never used smokeless tobacco. She reports that she drinks alcohol. She reports that she does not use drugs.  FAMHx: Family History  Problem Relation Age of Onset  . Coronary artery disease Mother 49  . Coronary artery disease Maternal Grandmother   . Cancer Other   . Hypertension Other   . Stroke Other      ALLERGIES: Allergies  Allergen Reactions  . Amantadines Swelling  . Amitriptyline Swelling  . Amlodipine Swelling  . Aspirin Nausea Only    Irritates stomach also/hx of stomach ulcers  . Latex Hives, Itching and Swelling    Burning also  . Simvastatin Swelling    Muscle pains also  . Tramadol Hives    ROS: Review of Systems: General: negative for chills, fever, night sweats or weight changes.  Cardiovascular: negative for palpitations, paroxysmal nocturnal dyspnea HEENT: negative for any visual disturbances, blindness, glaucoma Dermatological: negative for rash Respiratory: negative for cough, hemoptysis, or wheezing Urologic: negative for hematuria or dysuria Abdominal: negative for nausea, vomiting, diarrhea, bright red blood per rectum, melena, or hematemesis Neurologic: negative for visual changes, syncope, or dizziness Musculoskeletal: negative for back pain, joint pain, or swelling Psych: cooperative and appropriate Pt snores, she tells me she has never had a sleep study All other systems reviewed and are otherwise negative except as noted above.   HOME MEDICATIONS: Prior to Admission medications   Medication Sig Start Date End Date Taking? Authorizing Provider  acetaminophen (TYLENOL) 325 MG tablet Take 975 mg by mouth every 6 (six) hours as needed for headache.   Yes Historical Provider, MD  albuterol (PROAIR HFA) 108 (90 BASE) MCG/ACT inhaler Inhale 2 puffs into the lungs every 4 (four) hours as needed for wheezing or shortness of breath.    Yes Historical Provider, MD  baclofen (LIORESAL) 10 MG tablet Take 10 mg by mouth 2 (two) times daily as needed for muscle spasms.  05/16/16  Yes Historical Provider, MD  carvedilol (COREG) 12.5 MG tablet Take 1 tablet (12.5 mg total) by mouth 2 (two) times daily. 05/24/16 08/22/16 Yes Larey Dresser, MD  DULoxetine (CYMBALTA) 30 MG capsule Take 1 capsule by mouth 2 (two) times daily.  05/15/16  Yes Historical Provider, MD   fluticasone (FLONASE) 50 MCG/ACT nasal spray Place 1 spray into both nostrils 2 (two) times daily as needed for allergies.  04/17/14  Yes Historical Provider, MD  furosemide (LASIX) 40 MG tablet Take 40 mg by mouth daily.  05/15/16  Yes Historical Provider, MD  isosorbide mononitrate (IMDUR) 60 MG 24 hr tablet TAKE 1 TABLET (60 MG TOTAL) BY MOUTH 2 (TWO) TIMES DAILY. 11/11/14  Yes Larey Dresser, MD  levothyroxine (SYNTHROID, LEVOTHROID) 50 MCG tablet Take 50 mcg by mouth daily.    Yes Historical Provider, MD  losartan (COZAAR) 100 MG tablet Take 1 tablet (100 mg total) by mouth daily. 03/28/16  Yes Brittainy M Simmons, PA-C  mometasone-formoterol (DULERA) 100-5 MCG/ACT AERO Inhale 2 puffs into the lungs 2 (two) times daily. 07/09/15  Yes Historical Provider, MD  naproxen (NAPROSYN) 500 MG tablet Take 500 mg by mouth twice daily as needed for pain 04/07/16  Yes Historical Provider,  MD  nitroGLYCERIN (NITRODUR - DOSED IN MG/24 HR) 0.2 mg/hr patch PLACE 1 PATCH ONTO THE SKIN DAILY. 09/08/14  Yes Larey Dresser, MD  nitroGLYCERIN (NITROSTAT) 0.4 MG SL tablet PLACE 1 TABLET (0.4 MG TOTAL) UNDER THE TONGUE EVERY 5 (FIVE) MINUTES AS NEEDED FOR CHEST PAIN. 09/28/15  Yes Larey Dresser, MD  omeprazole (PRILOSEC) 40 MG capsule Take 40 mg by mouth daily.  11/15/14  Yes Historical Provider, MD  ondansetron (ZOFRAN) 4 MG tablet Take 1 tablet (4 mg total) by mouth every 6 (six) hours. Patient taking differently: Take 4 mg by mouth every 8 (eight) hours as needed for nausea or vomiting.  06/23/16  Yes Tiffany Carlota Raspberry, PA-C  promethazine (PHENERGAN) 25 MG tablet Take 25 mg by mouth every 4 (four) hours as needed for nausea or vomiting.    Yes Historical Provider, MD  ranitidine (ZANTAC) 150 MG tablet Take 150 mg by mouth 2 (two) times daily. 05/02/14  Yes Historical Provider, MD  sucralfate (CARAFATE) 1 g tablet Take 1 g by mouth 4 (four) times daily. 06/09/16  Yes Historical Provider, MD  topiramate (TOPAMAX) 100 MG tablet  Take 200 mg by mouth 2 (two) times daily.   Yes Historical Provider, MD  traZODone (DESYREL) 50 MG tablet Take 50 mg by mouth at bedtime.   Yes Historical Provider, MD  venlafaxine (EFFEXOR) 75 MG tablet Take 75 mg by mouth 2 (two) times daily.   Yes Historical Provider, MD  verapamil (CALAN) 120 MG tablet Take 120 mg by mouth 3 (three) times daily. 02/05/15  Yes Historical Provider, MD  Vitamin D, Ergocalciferol, (DRISDOL) 50000 units CAPS capsule Take 50000 units once weekly on monday 03/09/16  Yes Historical Provider, MD  montelukast (SINGULAIR) 10 MG tablet Take 10 mg by mouth daily as needed (for allergies).  04/17/14   Historical Provider, MD    HOSPITAL MEDICATIONS: I have reviewed the patient's current medications.  VITALS: Blood pressure (!) 141/88, pulse 77, temperature 97.8 F (36.6 C), temperature source Oral, resp. rate 19, height 5\' 5"  (1.651 m), weight 290 lb 9.1 oz (131.8 kg), last menstrual period 05/20/2016, SpO2 97 %.  PHYSICAL EXAM: General appearance: alert, cooperative, no distress and morbidly obese Neck: no carotid bruit and no JVD Lungs: clear to auscultation bilaterally Heart: regular rate and rhythm Abdomen: obese, non tender Extremities: no edema Pulses: 2+ and symmetric Skin: Skin color, texture, turgor normal. No rashes or lesions Neurologic: Grossly normal  LABS: Results for orders placed or performed during the hospital encounter of 08/04/16 (from the past 24 hour(s))  Basic metabolic panel     Status: Abnormal   Collection Time: 08/03/16 11:14 PM  Result Value Ref Range   Sodium 141 135 - 145 mmol/L   Potassium 3.5 3.5 - 5.1 mmol/L   Chloride 108 101 - 111 mmol/L   CO2 21 (L) 22 - 32 mmol/L   Glucose, Bld 104 (H) 65 - 99 mg/dL   BUN 17 6 - 20 mg/dL   Creatinine, Ser 0.98 0.44 - 1.00 mg/dL   Calcium 9.1 8.9 - 10.3 mg/dL   GFR calc non Af Amer >60 >60 mL/min   GFR calc Af Amer >60 >60 mL/min   Anion gap 12 5 - 15  CBC     Status: None    Collection Time: 08/03/16 11:14 PM  Result Value Ref Range   WBC 7.4 4.0 - 10.5 K/uL   RBC 4.63 3.87 - 5.11 MIL/uL   Hemoglobin 12.1 12.0 -  15.0 g/dL   HCT 39.1 36.0 - 46.0 %   MCV 84.4 78.0 - 100.0 fL   MCH 26.1 26.0 - 34.0 pg   MCHC 30.9 30.0 - 36.0 g/dL   RDW 15.4 11.5 - 15.5 %   Platelets 226 150 - 400 K/uL  hCG, quantitative, pregnancy     Status: None   Collection Time: 08/03/16 11:14 PM  Result Value Ref Range   hCG, Beta Chain, Quant, S 1 <5 mIU/mL  Troponin I     Status: None   Collection Time: 08/03/16 11:14 PM  Result Value Ref Range   Troponin I <0.03 <0.03 ng/mL  Troponin I-serum (0, 3, 6 hours)     Status: None   Collection Time: 08/04/16  5:53 AM  Result Value Ref Range   Troponin I <0.03 <0.03 ng/mL  CBC     Status: Abnormal   Collection Time: 08/04/16  5:53 AM  Result Value Ref Range   WBC 6.4 4.0 - 10.5 K/uL   RBC 4.35 3.87 - 5.11 MIL/uL   Hemoglobin 11.4 (L) 12.0 - 15.0 g/dL   HCT 36.7 36.0 - 46.0 %   MCV 84.4 78.0 - 100.0 fL   MCH 26.2 26.0 - 34.0 pg   MCHC 31.1 30.0 - 36.0 g/dL   RDW 15.5 11.5 - 15.5 %   Platelets 193 150 - 400 K/uL  Basic metabolic panel     Status: Abnormal   Collection Time: 08/04/16  5:53 AM  Result Value Ref Range   Sodium 140 135 - 145 mmol/L   Potassium 3.7 3.5 - 5.1 mmol/L   Chloride 109 101 - 111 mmol/L   CO2 21 (L) 22 - 32 mmol/L   Glucose, Bld 99 65 - 99 mg/dL   BUN 19 6 - 20 mg/dL   Creatinine, Ser 0.95 0.44 - 1.00 mg/dL   Calcium 8.9 8.9 - 10.3 mg/dL   GFR calc non Af Amer >60 >60 mL/min   GFR calc Af Amer >60 >60 mL/min   Anion gap 10 5 - 15  Brain natriuretic peptide     Status: None   Collection Time: 08/04/16  5:54 AM  Result Value Ref Range   B Natriuretic Peptide 22.3 0.0 - 100.0 pg/mL  TSH     Status: None   Collection Time: 08/04/16  5:54 AM  Result Value Ref Range   TSH 3.383 0.350 - 4.500 uIU/mL  Urine rapid drug screen (hosp performed)     Status: Abnormal   Collection Time: 08/04/16  6:37 AM    Result Value Ref Range   Opiates POSITIVE (A) NONE DETECTED   Cocaine NONE DETECTED NONE DETECTED   Benzodiazepines NONE DETECTED NONE DETECTED   Amphetamines NONE DETECTED NONE DETECTED   Tetrahydrocannabinol NONE DETECTED NONE DETECTED   Barbiturates NONE DETECTED NONE DETECTED  MRSA PCR Screening     Status: None   Collection Time: 08/04/16  6:41 AM  Result Value Ref Range   MRSA by PCR NEGATIVE NEGATIVE  Troponin I-serum (0, 3, 6 hours)     Status: None   Collection Time: 08/04/16  7:33 AM  Result Value Ref Range   Troponin I <0.03 <0.03 ng/mL    EKG: NSR, LVH, Qs V2  IMAGING: Dg Chest 2 View  Result Date: 08/03/2016 CLINICAL DATA:  Acute onset of generalized chest pain and nausea. Right arm pain. Initial encounter. EXAM: CHEST  2 VIEW COMPARISON:  Chest radiograph performed 12/19/2013 FINDINGS: The lungs are well-aerated and  clear. There is no evidence of focal opacification, pleural effusion or pneumothorax. The heart is normal in size; the mediastinal contour is within normal limits. No acute osseous abnormalities are seen. A metallic device is noted overlying the left chest wall. Clips are noted within the right upper quadrant, reflecting prior cholecystectomy. IMPRESSION: No acute cardiopulmonary process seen. Electronically Signed   By: Garald Balding M.D.   On: 08/03/2016 23:53    IMPRESSION:  Chest pain Convincing history for angina but extensive prior ischemic work up negative. Coronary spasm doesn't seem likely with negative Troponin. It could be secondary to acute on chronic diastolic CHF but BNP was only 22.3 and no CHF on CXR.   Acute on chronic diastolic CHF Last EF [id number] with grade 2 DD. BNP is normal but may not be reliable in diastolic CHF. She does have a h/o of LE edema on adm. Office wt in Dec 2017 was 280 when she had her Rt heart cath.  Her admission wgt was (?) 290.  Morbid obesity BMI 48. Suspect she has sleep apnea  Hypertension B/P fairly well  controlled   RECOMMENDATION: One dose of IV Lasix given, she may need further diuresis. It's difficult to determine volume statusMD to see. Arrange for sleep study as an OP. Not sure she needs both BID Imdur and a Nitro Dur patch-will D/C patch.  Time Spent Directly with Patient: 50 minutes  Kerin Ransom, [place] beeper 08/04/2016, 11:28 AM    Patient seen and examined. Agree with assessment and plan.Ms. Nusaybah Seastrand is a 51 year old female who has a history of morbid obesity and is followed by Dr. Algernon Huxley.  She has had recurrent episodes of chest pain and in the past has had numerous schemata evaluations including a cardiac catheterization in [place], a subsequent nuclear perfusion study in 2012 which did not reveal ischemia.  She also had normal CT angiogram of the calcium score of 0.  In 2013 repeat cardiac catheterization following a questionable non-ST segment elevation MI in [place] revealed normal coronary arteries.  She has been documented have normal LV function and her most recent echo in October 2017 continues to show an EF of 60-65%, LVH with grade 2 diastolic dysfunction.  She had mild right heart pressure elevation on right heart catheterization in December 2017.  She admits to occasional episodes of chest discomfort described as tightness with activity.  She denies any rest symptoms.  He has been on carvedilol 12.5 mg twice a day in addition to isosorbide mononitrate 60 mg twice a day as well as losartan 100 mg daily and Lasix 40 mg daily for blood pressure and her symptoms.  She has also utilize nonsteroidal anti-inflammatory medication.  Exam is notable for morbid obesity.  HEENT is unremarkable.  There is no JVD.  Lungs are clear.  There is no chest wall tenderness.  Rhythm was regular with a faint 1/6 systolic murmur.  Her abdomen is protuberant.  There is no tenderness.  Bowel sounds are present in 4 quadrants.  There was no edema.  ECG reveals normal sinus rhythm with LVH and  ST changes, most likely contributed by her ventricular hypertrophy.  She is on a good anti-ischemic regimen including beta blocker with carvedilol as well as nitrates with isosorbide mononitrate.  Resting pulse is in the 80s.  It may be worthwhile to further titrate carvedilol to 25 mg twice a day.  There are extensive previous evaluations for CAD, at this point, I do not believe we  need to pursue coronary angiography.  It is possible she may have small vessel disease rather than epicardial disease.  Depending on her blood pressure, consider the addition of amlodipine to her medical regimen.   Troy Sine, MD, [facility] [place] [facility] 08/04/2016 2:40 PM

## 2016-08-05 DIAGNOSIS — G894 Chronic pain syndrome: Secondary | ICD-10-CM

## 2016-08-05 DIAGNOSIS — R079 Chest pain, unspecified: Secondary | ICD-10-CM | POA: Diagnosis not present

## 2016-08-05 DIAGNOSIS — J45909 Unspecified asthma, uncomplicated: Secondary | ICD-10-CM

## 2016-08-05 DIAGNOSIS — K219 Gastro-esophageal reflux disease without esophagitis: Secondary | ICD-10-CM

## 2016-08-05 DIAGNOSIS — E038 Other specified hypothyroidism: Secondary | ICD-10-CM | POA: Diagnosis not present

## 2016-08-05 DIAGNOSIS — I5032 Chronic diastolic (congestive) heart failure: Secondary | ICD-10-CM | POA: Diagnosis not present

## 2016-08-05 DIAGNOSIS — J4550 Severe persistent asthma, uncomplicated: Secondary | ICD-10-CM

## 2016-08-05 DIAGNOSIS — Z6841 Body Mass Index (BMI) 40.0 and over, adult: Secondary | ICD-10-CM | POA: Diagnosis not present

## 2016-08-05 LAB — BASIC METABOLIC PANEL
Anion gap: 7 (ref 5–15)
BUN: 20 mg/dL (ref 6–20)
CO2: 25 mmol/L (ref 22–32)
Calcium: 8.7 mg/dL — ABNORMAL LOW (ref 8.9–10.3)
Chloride: 107 mmol/L (ref 101–111)
Creatinine, Ser: 1.21 mg/dL — ABNORMAL HIGH (ref 0.44–1.00)
GFR calc Af Amer: 59 mL/min — ABNORMAL LOW (ref >60)
GFR calc non Af Amer: 51 mL/min — ABNORMAL LOW (ref >60)
Glucose, Bld: 108 mg/dL — ABNORMAL HIGH (ref 65–99)
Potassium: 3.8 mmol/L (ref 3.5–5.1)
Sodium: 139 mmol/L (ref 135–145)

## 2016-08-05 LAB — CBC
HCT: 36.7 % (ref 36.0–46.0)
Hemoglobin: 11.3 g/dL — ABNORMAL LOW (ref 12.0–15.0)
MCH: 26.2 pg (ref 26.0–34.0)
MCHC: 30.8 g/dL (ref 30.0–36.0)
MCV: 85 fL (ref 78.0–100.0)
Platelets: 215 10*3/uL (ref 150–400)
RBC: 4.32 MIL/uL (ref 3.87–5.11)
RDW: 15.7 % — ABNORMAL HIGH (ref 11.5–15.5)
WBC: 6.9 10*3/uL (ref 4.0–10.5)

## 2016-08-05 MED ORDER — CARVEDILOL 25 MG PO TABS: 25.0000 mg | ORAL_TABLET | Freq: Two times a day (BID) | ORAL | 1 refills | Status: DC

## 2016-08-05 MED ORDER — PANTOPRAZOLE SODIUM 40 MG PO TBEC: 40.0000 mg | DELAYED_RELEASE_TABLET | Freq: Two times a day (BID) | ORAL | 2 refills | Status: DC

## 2016-08-05 NOTE — Discharge Summary (Signed)
Physician Discharge Summary  [name] [id number] DOB: June 29, 1965 DOA: 08/04/2016  PCP: Mackie Pai, MD  Admit date: 08/04/2016 Discharge date: 08/05/2016  Admitted From: Home Disposition: Home  Recommendations for Outpatient Follow-up:  1. Follow up with PCP in 1-2 weeks 2. Please obtain BMP/CBC in one week  Home Health: NA Equipment/Devices:NA  Discharge Condition: Stable CODE STATUS: Full Code Diet recommendation: Diet Heart Room service appropriate? Yes; Fluid consistency: Thin Diet - low sodium heart healthy  Brief/Interim Summary: [name] a 51 y.o.femalewith medical history significant of HTN, HLDchronic chest pain , MI,hypothyroidism, diastolic CHF EF 123456 with grade 2 dFx; who presents with complaints of waxingand waning substernal chest pain since yesterday morning. Symptoms started while patient was at rest and radiated into her face and her right arm. Ratedpain a 10 out of 10 on the pain scale. Patient notes any physical activity or exertion worsenedsymptoms. Associated symptoms included shortness of breath, diaphoresis, nausea, vomiting, leg swelling, and lightheadedness.   Discharge Diagnoses:  Principal Problem:   Chest pain Active Problems:   GERD (gastroesophageal reflux disease)   Hypothyroidism   Chronic diastolic heart failure (HCC)   Asthma   BMI 40.0-44.9, adult (HCC)   Severe persistent asthma   Chronic pain syndrome   Chest pain -Patient presents with chest pain without relief with multiple dosesof nitroglycerin. -Heart Score = 4. Patient with recent normal right heart cath. Followed in outpatient setting by Dr. [name].Differential includes angina versus GERD related chest pain versus othernoncardiac chest pain (costochondritis). -Cardiology consulted, recommended to increase Coreg to 25 twice a day, no other workup. -Negative 3 sets of cardiac enzymes, EKG without ischemic findings, cardiology does not recommend cardiac  cath for now. -Discharged home to follow-up with PCP and primary cardiologist.  Nauseaand vomiting, history of GERD. -Has history of GERD, she was out of her omeprazole and ranitidine. -Switched to Protonix twice a day, discontinue ranitidine. Aleve discontinued  Chronic diastolic CHF -Lastechocardiogram performed in 03/2016 Patient has 1+ pitting edema of the bilateral lower extremities but does not appear to be acutely fluid overloaded at this time. -BNP is not elevated, does not appear to have acute exacerbation. -Continue furosemide, isosorbide mononitrate Coreg dose increased to 25 mg twice a day  Lower extremity edema  -Likely secondary to her diastolic CHF.  Essential hypertension - Continue losartan, verapamil   Chronic pain - Continue Cymbalta, Effexor   Asthma - Continue Singulair, Dulera, Flonase prn allergies  Hypothyroidism - TSH is 3.383 - Continue levothyroxine  GERD: -Patient ran out of prescription for Prilosec and ranitidine -Continue Carafate, placed on Protonix twice a day.   Discharge Instructions  Discharge Instructions    Diet - low sodium heart healthy    Complete by:  As directed    Increase activity slowly    Complete by:  As directed      Allergies as of 08/05/2016      Reactions   Amantadines Swelling   Amitriptyline Swelling   Amlodipine Swelling   Aspirin Nausea Only   Irritates stomach also/hx of stomach ulcers   Latex Hives, Itching, Swelling   Burning also   Simvastatin Swelling   Muscle pains also   Tramadol Hives      Medication List    STOP taking these medications   naproxen 500 MG tablet Commonly known as:  NAPROSYN   omeprazole 40 MG capsule Commonly known as:  PRILOSEC Replaced by:  pantoprazole 40 MG tablet   ranitidine 150 MG tablet Commonly  known as:  ZANTAC     TAKE these medications   acetaminophen 325 MG tablet Commonly known as:  TYLENOL Take 975 mg by mouth every 6 (six) hours as needed  for headache.   baclofen 10 MG tablet Commonly known as:  LIORESAL Take 10 mg by mouth 2 (two) times daily as needed for muscle spasms.   CALAN 120 MG tablet Generic drug:  verapamil Take 120 mg by mouth 3 (three) times daily.   carvedilol 25 MG tablet Commonly known as:  COREG Take 1 tablet (25 mg total) by mouth 2 (two) times daily with a meal. What changed:  medication strength  how much to take  when to take this   DULoxetine 30 MG capsule Commonly known as:  CYMBALTA Take 1 capsule by mouth 2 (two) times daily.   fluticasone 50 MCG/ACT nasal spray Commonly known as:  FLONASE Place 1 spray into both nostrils 2 (two) times daily as needed for allergies.   furosemide 40 MG tablet Commonly known as:  LASIX Take 40 mg by mouth daily.   isosorbide mononitrate 60 MG 24 hr tablet Commonly known as:  IMDUR TAKE 1 TABLET (60 MG TOTAL) BY MOUTH 2 (TWO) TIMES DAILY.   levothyroxine 50 MCG tablet Commonly known as:  SYNTHROID, LEVOTHROID Take 50 mcg by mouth daily.   losartan 100 MG tablet Commonly known as:  COZAAR Take 1 tablet (100 mg total) by mouth daily.   mometasone-formoterol 100-5 MCG/ACT Aero Commonly known as:  DULERA Inhale 2 puffs into the lungs 2 (two) times daily.   montelukast 10 MG tablet Commonly known as:  SINGULAIR Take 10 mg by mouth daily as needed (for allergies).   nitroGLYCERIN 0.2 mg/hr patch Commonly known as:  NITRODUR - Dosed in mg/24 hr PLACE 1 PATCH ONTO THE SKIN DAILY.   nitroGLYCERIN 0.4 MG SL tablet Commonly known as:  NITROSTAT PLACE 1 TABLET (0.4 MG TOTAL) UNDER THE TONGUE EVERY 5 (FIVE) MINUTES AS NEEDED FOR CHEST PAIN.   ondansetron 4 MG tablet Commonly known as:  ZOFRAN Take 1 tablet (4 mg total) by mouth every 6 (six) hours. What changed:  when to take this  reasons to take this   pantoprazole 40 MG tablet Commonly known as:  PROTONIX Take 1 tablet (40 mg total) by mouth 2 (two) times daily. Replaces:  omeprazole  40 MG capsule   PROAIR HFA 108 (90 Base) MCG/ACT inhaler Generic drug:  albuterol Inhale 2 puffs into the lungs every 4 (four) hours as needed for wheezing or shortness of breath.   promethazine 25 MG tablet Commonly known as:  PHENERGAN Take 25 mg by mouth every 4 (four) hours as needed for nausea or vomiting.   sucralfate 1 g tablet Commonly known as:  CARAFATE Take 1 g by mouth 4 (four) times daily.   topiramate 100 MG tablet Commonly known as:  TOPAMAX Take 200 mg by mouth 2 (two) times daily.   traZODone 50 MG tablet Commonly known as:  DESYREL Take 50 mg by mouth at bedtime.   venlafaxine 75 MG tablet Commonly known as:  EFFEXOR Take 75 mg by mouth 2 (two) times daily.   Vitamin D (Ergocalciferol) 50000 units Caps capsule Commonly known as:  DRISDOL Take 50000 units once weekly on monday      Follow-up Information    BHATTI, MUHAMMAD, MD Follow up in 1 week(s).   Specialty:  Family Medicine Contact information: [address] [place] [address] [phone]  Allergies  Allergen Reactions  . Amantadines Swelling  . Amitriptyline Swelling  . Amlodipine Swelling  . Aspirin Nausea Only    Irritates stomach also/hx of stomach ulcers  . Latex Hives, Itching and Swelling    Burning also  . Simvastatin Swelling    Muscle pains also  . Tramadol Hives    Consultations:  Treatment Team:   Rounding Lbcardiology, MD  Procedures (Echo, Carotid, EGD, Colonoscopy, ERCP)   Radiological studies: Dg Chest 2 View  Result Date: 08/03/2016 CLINICAL DATA:  Acute onset of generalized chest pain and nausea. Right arm pain. Initial encounter. EXAM: CHEST  2 VIEW COMPARISON:  Chest radiograph performed 12/19/2013 FINDINGS: The lungs are well-aerated and clear. There is no evidence of focal opacification, pleural effusion or pneumothorax. The heart is normal in size; the mediastinal contour is within normal limits. No acute osseous abnormalities are seen. A  metallic device is noted overlying the left chest wall. Clips are noted within the right upper quadrant, reflecting prior cholecystectomy. IMPRESSION: No acute cardiopulmonary process seen. Electronically Signed   By: Garald Balding M.D.   On: 08/03/2016 23:53     Subjective:  Discharge Exam: Vitals:   08/04/16 1953 08/04/16 2357 08/05/16 0341 08/05/16 0915  BP: (!) 103/56 120/71 121/66 127/70  Pulse:  64 75 88  Resp: 19 (!) 25 20 (!) 25  Temp: 97.5 F (36.4 C) 98.1 F (36.7 C) 97.8 F (36.6 C) 97.8 F (36.6 C)  TempSrc: Oral Oral Oral Oral  SpO2: 98% 96% 97%   Weight:   132 kg (291 lb 0.1 oz)   Height:       General: Pt is alert, awake, not in acute distress Cardiovascular: RRR, S1/S2 +, no rubs, no gallops Respiratory: CTA bilaterally, no wheezing, no rhonchi Abdominal: Soft, NT, ND, bowel sounds + Extremities: no edema, no cyanosis   The results of significant diagnostics from this hospitalization (including imaging, microbiology, ancillary and laboratory) are listed below for reference.    Microbiology: Recent Results (from the past 240 hour(s))  MRSA PCR Screening     Status: None   Collection Time: 08/04/16  6:41 AM  Result Value Ref Range Status   MRSA by PCR NEGATIVE NEGATIVE Final    Comment:        The GeneXpert MRSA Assay (FDA approved for NASAL specimens only), is one component of a comprehensive MRSA colonization surveillance program. It is not intended to diagnose MRSA infection nor to guide or monitor treatment for MRSA infections.      Labs: BNP (last 3 results)  Recent Labs  03/06/16 1148 08/04/16 0554  BNP <4.0 99991111   Basic Metabolic Panel:  Recent Labs Lab 08/03/16 2314 08/04/16 0553 08/05/16 0234  NA 141 140 139  K 3.5 3.7 3.8  CL 108 109 107  CO2 21* 21* 25  GLUCOSE 104* 99 108*  BUN 17 19 20   CREATININE 0.98 0.95 1.21*  CALCIUM 9.1 8.9 8.7*   Liver Function Tests: No results for input(s): AST, ALT, ALKPHOS, BILITOT,  PROT, ALBUMIN in the last 168 hours. No results for input(s): LIPASE, AMYLASE in the last 168 hours. No results for input(s): AMMONIA in the last 168 hours. CBC:  Recent Labs Lab 08/03/16 2314 08/04/16 0553 08/05/16 0234  WBC 7.4 6.4 6.9  HGB 12.1 11.4* 11.3*  HCT 39.1 36.7 36.7  MCV 84.4 84.4 85.0  PLT 226 193 215   Cardiac Enzymes:  Recent Labs Lab 08/03/16 2314 08/04/16 0553 08/04/16 0733 08/04/16  [place] II <0.03 <0.03 <0.03 <0.03   BNP: Invalid input(s): POCBNP CBG: No results for input(s): GLUCAP in the last 168 hours. D-Dimer No results for input(s): DDIMER in the last 72 hours. Hgb A1c No results for input(s): HGBA1C in the last 72 hours. Lipid Profile No results for input(s): CHOL, HDL, LDLCALC, TRIG, CHOLHDL, LDLDIRECT in the last 72 hours. Thyroid function studies  Recent Labs  08/04/16 0554  TSH 3.383   Anemia work up No results for input(s): VITAMINB12, FOLATE, FERRITIN, TIBC, IRON, RETICCTPCT in the last 72 hours. Urinalysis    Component Value Date/Time   COLORURINE YELLOW 06/23/2016 1143   APPEARANCEUR CLEAR 06/23/2016 1143   LABSPEC 1.017 06/23/2016 1143   PHURINE 6.0 06/23/2016 1143   GLUCOSEU NEGATIVE 06/23/2016 1143   HGBUR MODERATE (A) 06/23/2016 1143   BILIRUBINUR NEGATIVE 06/23/2016 1143   KETONESUR NEGATIVE 06/23/2016 1143   PROTEINUR NEGATIVE 06/23/2016 1143   UROBILINOGEN 1.0 12/15/2011 1236   NITRITE NEGATIVE 06/23/2016 1143   LEUKOCYTESUR NEGATIVE 06/23/2016 1143   Sepsis Labs Invalid input(s): PROCALCITONIN,  WBC,  LACTICIDVEN Microbiology Recent Results (from the past 240 hour(s))  MRSA PCR Screening     Status: None   Collection Time: 08/04/16  6:41 AM  Result Value Ref Range Status   MRSA by PCR NEGATIVE NEGATIVE Final    Comment:        The GeneXpert MRSA Assay (FDA approved for NASAL specimens only), is one component of a comprehensive MRSA colonization surveillance program. It is not intended to diagnose  MRSA infection nor to guide or monitor treatment for MRSA infections.      Time coordinating discharge: Over 30 minutes  SIGNED:   Birdie Hopes, MD  [facility] 08/05/2016, 10:24 AM Pager   If 7PM-7AM, please contact night-coverage [email or web address] Password [email or web address]

## 2016-08-05 NOTE — Progress Notes (Signed)
Progress Note  Patient Name: [name] Date of Encounter: 08/05/2016  Primary Cardiologist: Dr [name]  Subjective   She feels better today, denies chest pain. SOB has improved with Lasix.  Inpatient Medications    Scheduled Meds: . carvedilol  12.5 mg Oral BID WC  . DULoxetine  30 mg Oral BID  . enoxaparin (LOVENOX) injection  40 mg Subcutaneous Daily  . furosemide  40 mg Oral Daily  . isosorbide mononitrate  60 mg Oral BID  . levothyroxine  50 mcg Oral QAC breakfast  . losartan  100 mg Oral Daily  . mometasone-formoterol  2 puff Inhalation BID  . pantoprazole  40 mg Oral BID  . sucralfate  1 g Oral TID AC & HS  . topiramate  200 mg Oral BID  . traZODone  50 mg Oral QHS  . venlafaxine  75 mg Oral BID WC  . verapamil  120 mg Oral Q8H   Continuous Infusions:  PRN Meds: acetaminophen, baclofen, fluticasone, gi cocktail, montelukast, morphine injection, ondansetron (ZOFRAN) IV   Vital Signs    Vitals:   08/04/16 1953 08/04/16 2357 08/05/16 0341 08/05/16 0915  BP: (!) 103/56 120/71 121/66 127/70  Pulse:  64 75 88  Resp: 19 (!) 25 20 (!) 25  Temp: 97.5 F (36.4 C) 98.1 F (36.7 C) 97.8 F (36.6 C) 97.8 F (36.6 C)  TempSrc: Oral Oral Oral Oral  SpO2: 98% 96% 97%   Weight:   291 lb 0.1 oz (132 kg)   Height:        Intake/Output Summary (Last 24 hours) at 08/05/16 1030 Last data filed at 08/05/16 0000  Gross per 24 hour  Intake              720 ml  Output                0 ml  Net              720 ml   Filed Weights   08/03/16 2306 08/04/16 0643 08/05/16 0341  Weight: 275 lb (124.7 kg) 290 lb 9.1 oz (131.8 kg) 291 lb 0.1 oz (132 kg)    Telemetry    SR with HR in 80' - Personally Reviewed  ECG    SR, negative T waves in the inferolateral leads, unchanged from prior - Personally Reviewed  Physical Exam   GEN: No acute distress.   Neck: No JVD Cardiac: RRR, no murmurs, rubs, or gallops.  Respiratory: Clear to auscultation bilaterally. GI: Soft,  nontender, non-distended  MS: No edema; No deformity. Neuro:  Nonfocal  Psych: Normal affect   Labs    Chemistry Recent Labs Lab 08/03/16 2314 08/04/16 0553 08/05/16 0234  NA 141 140 139  K 3.5 3.7 3.8  CL 108 109 107  CO2 21* 21* 25  GLUCOSE 104* 99 108*  BUN 17 19 20   CREATININE 0.98 0.95 1.21*  CALCIUM 9.1 8.9 8.7*  GFRNONAA >60 >60 51*  GFRAA >60 >60 59*  ANIONGAP 12 10 7      Hematology Recent Labs Lab 08/03/16 2314 08/04/16 0553 08/05/16 0234  WBC 7.4 6.4 6.9  RBC 4.63 4.35 4.32  HGB 12.1 11.4* 11.3*  HCT 39.1 36.7 36.7  MCV 84.4 84.4 85.0  MCH 26.1 26.2 26.2  MCHC 30.9 31.1 30.8  RDW 15.4 15.5 15.7*  PLT 226 193 215   Cardiac Enzymes Recent Labs Lab 08/03/16 2314 08/04/16 0553 08/04/16 0733 08/04/16 1042  TROPONINI <0.03 <0.03 <0.03 <  0.03   No results for input(s): TROPIPOC in the last 168 hours.   BNP Recent Labs Lab 08/04/16 0554  BNP 22.3    DDimer No results for input(s): DDIMER in the last 168 hours.   Radiology    Dg Chest 2 View  Result Date: 08/03/2016 CLINICAL DATA:  Acute onset of generalized chest pain and nausea. Right arm pain. Initial encounter. EXAM: CHEST  2 VIEW COMPARISON:  Chest radiograph performed 12/19/2013 FINDINGS: The lungs are well-aerated and clear. There is no evidence of focal opacification, pleural effusion or pneumothorax. The heart is normal in size; the mediastinal contour is within normal limits. No acute osseous abnormalities are seen. A metallic device is noted overlying the left chest wall. Clips are noted within the right upper quadrant, reflecting prior cholecystectomy. IMPRESSION: No acute cardiopulmonary process seen. Electronically Signed   By: Garald Balding M.D.   On: 08/03/2016 23:53   Cardiac Studies   Telemetry: personally reviewed, shows SR with HR in 30'    Patient Profile     51 y.o. female admitted with chest pain and extensive prior workup for CAD, with normal coronaries (see  H&P)  Assessment & Plan    Chest pain Extensive prior negative workup for CAD, troponin negative x 3. No ischemic workup planned this time.   Acute on chronic diastolic CHF Last EF 123456 with grade 2 DD. BNP is normal but may not be reliable in diastolic CHF. She had elevated filling pressures on right sided cath in 05/2016. She is advised to buy a scale and use an extra lasix 40 gh po in the afternoon if her weight increases by 3 lbs overnight or by 5 lbs in 1 week.   Morbid obesity BMI 48. Suspect she has sleep apnea  Hypertension BP is controlled  She is stable for a discharge from cardiac standpoint. We will arrange for an outpatient follow up in 2-3 weeks.   Signed, Ena Dawley, MD  08/05/2016, 10:30 AM

## 2016-08-08 ENCOUNTER — Encounter: Payer: Self-pay | Admitting: Physical Therapy

## 2016-08-08 ENCOUNTER — Ambulatory Visit: Payer: Medicare Other | Attending: Family Medicine | Admitting: Physical Therapy

## 2016-08-08 DIAGNOSIS — M62830 Muscle spasm of back: Secondary | ICD-10-CM | POA: Diagnosis present

## 2016-08-08 DIAGNOSIS — M545 Low back pain, unspecified: Secondary | ICD-10-CM

## 2016-08-08 DIAGNOSIS — G8929 Other chronic pain: Secondary | ICD-10-CM | POA: Diagnosis present

## 2016-08-08 DIAGNOSIS — R262 Difficulty in walking, not elsewhere classified: Secondary | ICD-10-CM | POA: Diagnosis present

## 2016-08-09 ENCOUNTER — Encounter: Payer: Self-pay | Admitting: Physical Therapy

## 2016-08-09 NOTE — Therapy (Signed)
[place], [place], [address] Phone: [phone]   Fax:  [phone]  Physical Therapy Treatment  Patient Details  Name: [name] MRN: [id number] Date of Birth: 01-17-1966 Referring Provider: Dr Rodell Perna   Encounter Date: 08/08/2016      PT End of Session - 08/09/16 0838    Visit Number 9   Number of Visits 16   Date for PT Re-Evaluation 08/28/16   Authorization Type Medicare   PT Start Time 0300   PT Stop Time 0352   PT Time Calculation (min) 52 min   Activity Tolerance Patient tolerated treatment well   Behavior During Therapy [facility] for tasks assessed/performed      Past Medical History:  Diagnosis Date  . Asthma   . Biliary dyskinesia    a. s/p cholecystectomy.  . 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 Abnl stress [place] 2008, f/u cath reportedly nl. b. ETT-Myoview 04/2011 - EKG changes but normal perfusion. Cor CT - no coronary calcium, no definite stenosis though mRCA not fully evaluated. c. 10/2011 - tn elevated in [place], LHC without CAD. Started on Ranexa, anti-anginals ?microvascular dz but later stopped while in hospital on abx.  . Complication of anesthesia    hard to wake up-had to be reminded to breath  . GERD (gastroesophageal reflux disease)    a. Severe.  Marland Kitchen HTN (hypertension)   . Hx of cardiovascular stress test    [place] Myoview 8/14:  Normal, EF 74%  . Hx of echocardiogram    Echo 3/16:  Mild LVH, EF 55-60%, Gr 1 DD, trivial MR, mild LAE, normal RVF  . Hypothyroid   . MI (myocardial infarction)   . Migraine   . MRSA infection    a. After vagal nerve stimulator at [facility] - surgical site MRSA infection, PICC placed.  . Obesity   . Palpitations    a. 08/2014: 48 hour holter with 2 PVCs otherwise normal.  . Seizure disorder ([place])    a. since childhood. b. s/p vagal nerve stimulator at [facility].  . Seizures ([place])     Past Surgical History:  Procedure  Laterality Date  . [place] N/A 06/01/2016   Procedure: Right Heart Cath;  Surgeon: Larey Dresser, MD;  Location: [place] CV [facility];  Service: Cardiovascular;  Laterality: N/A;  . CESAREAN SECTION     placement of vagal nerve stimulator.  . CHOLECYSTECTOMY N/A 01/24/2013   Procedure: LAPAROSCOPIC CHOLECYSTECTOMY;  Surgeon: Harl Bowie, MD;  Location: [place];  Service: General;  Laterality: N/A;  . IMPLANTATION VAGAL NERVE STIMULATOR  [phone]   [facility]    There were no vitals filed for this visit.      Subjective Assessment - 08/09/16 0832    Subjective Patient was in the hospital for 3 days 2nd to low vitamin D. she reports since it has gone back up she feels much better. she is having less soreness in her ankle sand less soreness in her back.    Limitations Standing;Walking   How long can you sit comfortably? > 1 hour    How long can you stand comfortably? < 10 min    How long can you walk comfortably? < 300' without increased pain    Diagnostic tests x-ray: no ankle fx    Patient Stated Goals to have less pain when  perfroming activity    Currently in Pain? Yes   Pain Score 3    Pain Location Ankle   Pain Orientation Mid   Pain Descriptors / Indicators Aching   Pain Type Acute pain   Pain Onset Today   Pain Frequency Occasional   Aggravating Factors  movement    Pain Relieving Factors rest    Effect of Pain on Daily Activities pain with walking    Multiple Pain Sites Yes   Pain Score 6   Pain Location Back   Pain Orientation Right   Pain Descriptors / Indicators Aching   Pain Type Chronic pain   Pain Onset More than a month ago   Pain Frequency Constant   Aggravating Factors  standing and walking    Pain Relieving Factors rest    Effect of Pain on Daily Activities difficulty perfroming ADL's                          [facility] Adult PT  Treatment/Exercise - 08/09/16 0001      Lumbar Exercises: Stretches   Single Knee to Chest Stretch Limitations 2x20sec with towel on the right    Lower Trunk Rotation Limitations x10 in low range    Piriformis Stretch Limitations with towell 3x20 seconds each side      Lumbar Exercises: Supine   Clam Limitations yellow 2x10    Other Supine Lumbar Exercises ; ball squeeze 2x10;      Moist Heat Therapy   Number Minutes Moist Heat 10 Minutes   Moist Heat Location Lumbar Spine     Manual Therapy   Manual therapy comments  PA glide on the right hip; trigger point release to bilateral lumbar spine, Posterior glide on the right to improve right hip flexion; Ankle DF stretch; ankle anterior draw glide. long axis disraction to the right hip.      Ankle Exercises: Supine   Other Supine Ankle Exercises supine ankle DF stretch with strap 210x 10 sec; ankle pump 2x10; ev/iv 2x10                 PT Education - 08/09/16 0837    Education provided Yes   Education Details continue with current HEP.    Person(s) Educated Patient   Methods Explanation;Demonstration   Comprehension Verbalized understanding;Returned demonstration          PT Short Term Goals - 08/01/16 1512      PT SHORT TERM GOAL #1   Title Patient will increase lumbar flexion and extension by 25%    Baseline limited 75% flexion; 50% extension 2/26   Time 4   Period Weeks   Status On-going     PT SHORT TERM GOAL #2   Title Patient will increase gross right ankle strength to 4/5 for ER/IR and DF    Baseline 3/5 DF today    Time 4   Period Weeks   Status On-going     PT SHORT TERM GOAL #3   Title Patient will demsotrate a good core contraction    Baseline Unable to perfrom basic core contraction today    Time 4   Period Weeks   Status On-going     PT SHORT TERM GOAL #4   Title Patient will be independent with initial HEP    Time 4   Period Weeks   Status On-going           PT Long Term Goals -  08/09/16  0843      PT LONG TERM GOAL #1   Title Patient will stand for 1 hour without report of ankle and back pain    Time 8   Period Weeks   Status On-going     PT LONG TERM GOAL #2   Title Patient will go up/down 8 steps without increased pain    Time 8   Period Weeks   Status On-going     PT LONG TERM GOAL #3   Title Patient will ambualte 3000' without increased pain in order to go shopping   Time 8   Period Weeks   Status On-going     PT LONG TERM GOAL #4   Title Patient will return to gym with a program to improve core and lower extremity strength    Time 8   Period Weeks   Status On-going               Plan - 08/09/16 5638    Clinical Impression Statement Patient did much better today. She was able to tolerate exercises and was able to tolerate manual therapy. She continues to have some lower back pain with activity but it has significantly improved. Therapy educated her on the improtance of doing her exercises even if she is hurting on that particualr day. The stretches in particualr should help her pain.    Rehab Potential Good   Clinical Impairments Affecting Rehab Potential long standing lower back pain    PT Frequency 2x / week   PT Duration 8 weeks   PT Treatment/Interventions ADLs/Self Care Home Management;Cryotherapy;Electrical Stimulation;Gait training;Stair training;Ultrasound;Traction;Iontophoresis 4mg /ml Dexamethasone;Moist Heat;Therapeutic activities;Therapeutic exercise;Neuromuscular re-education;Patient/family education;Manual techniques;Taping;Splinting;Energy conservation;Dry needling;Passive range of motion   PT Next Visit Plan consider maual therapy to increase hip motion, Continue with HEP; Consider light core strengthening clam shell, ball squeeze, Continue to perfrom manual DF ROM; add light resistance to ankle 4 way, patient is alergic to latex though. Continue to work on seated ankle exercises.     PT Home Exercise Plan LTR, abdominal  breathing, single knee to chest stretch, ankle    Consulted and Agree with Plan of Care Patient      Patient will benefit from skilled therapeutic intervention in order to improve the following deficits and impairments:  Abnormal gait, Decreased strength, Decreased mobility, Pain, Increased muscle spasms, Decreased endurance, Decreased safety awareness, Decreased activity tolerance  Visit Diagnosis: Chronic bilateral low back pain without sciatica  Muscle spasm of back  Difficulty in walking, not elsewhere classified     Problem List Patient Active Problem List   Diagnosis Date Noted  . Chest pain 08/04/2016  . Chronic pain syndrome 08/04/2016  . Midline low back pain without sciatica 04/19/2016  . Abnormal weight gain 06/11/2015  . BMI 40.0-44.9, adult ([place]) 06/11/2015  . Spells 01/03/2015  . Asthma 08/24/2014  . Chronic diastolic heart failure ([place]) 06/19/2014  . Allergic rhinitis 05/04/2014  . Severe persistent asthma 05/04/2014  . Pharyngoesophageal dysphagia 01/30/2014  . Chest pain syndrome 07/02/2013  . Hypothyroidism 07/02/2013  . Hypothyroid   . Chronic chest pain   . GERD (gastroesophageal reflux disease) 02/10/2013  . Biliary dyskinesia 12/30/2012  . Exertional dyspnea 09/19/2012  . Status post VNS (vagus nerve stimulator) placement 06/10/2012  . Refusal of blood transfusions as patient is Jehovah's Witness 05/20/2012  . Dyslipidemia 12/29/2011  . 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 Dyslipidemia, goal LDL below 70 05/17/2011  . Non-cardiac chest pain secondary  to GERD 04/24/2011  . HTN, goal below 130/80 04/24/2011    Carney Living PT [name]  08/09/2016, 8:45 AM  [facility] [address] [place], [place], [address] Phone: [phone]   Fax:  [phone]  Name: [name] MRN: [id number] Date of Birth: 1965-10-02

## 2016-08-10 ENCOUNTER — Ambulatory Visit: Payer: Medicare Other | Admitting: Physical Therapy

## 2016-08-10 ENCOUNTER — Encounter: Payer: Self-pay | Admitting: Physical Therapy

## 2016-08-10 DIAGNOSIS — M545 Low back pain, unspecified: Secondary | ICD-10-CM

## 2016-08-10 DIAGNOSIS — Z1211 Encounter for screening for malignant neoplasm of colon: Secondary | ICD-10-CM | POA: Diagnosis not present

## 2016-08-10 DIAGNOSIS — K219 Gastro-esophageal reflux disease without esophagitis: Secondary | ICD-10-CM | POA: Diagnosis not present

## 2016-08-10 DIAGNOSIS — M62830 Muscle spasm of back: Secondary | ICD-10-CM

## 2016-08-10 DIAGNOSIS — G8929 Other chronic pain: Secondary | ICD-10-CM

## 2016-08-10 DIAGNOSIS — R262 Difficulty in walking, not elsewhere classified: Secondary | ICD-10-CM

## 2016-08-10 DIAGNOSIS — K5901 Slow transit constipation: Secondary | ICD-10-CM | POA: Diagnosis not present

## 2016-08-10 DIAGNOSIS — R635 Abnormal weight gain: Secondary | ICD-10-CM | POA: Diagnosis not present

## 2016-08-10 NOTE — Therapy (Signed)
[place], [place], [address] Phone: [phone]   Fax:  [phone]  Physical Therapy Treatment  Patient Details  Name: [name] MRN: [id number] Date of Birth: 02-Apr-1966 Referring Provider: Dr Rodell Perna   Encounter Date: 08/10/2016      PT End of Session - 08/10/16 1540    Visit Number 10   Number of Visits 16   Date for PT Re-Evaluation 08/28/16   Authorization Type Medicare   PT Start Time 1010   PT Stop Time 1048   PT Time Calculation (min) 38 min   Activity Tolerance Patient tolerated treatment well   Behavior During Therapy [facility] for tasks assessed/performed      Past Medical History:  Diagnosis Date  . Asthma   . Biliary dyskinesia    a. s/p cholecystectomy.  . 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 Abnl stress [place] 2008, f/u cath reportedly nl. b. ETT-Myoview 04/2011 - EKG changes but normal perfusion. Cor CT - no coronary calcium, no definite stenosis though mRCA not fully evaluated. c. 10/2011 - [place], LHC without CAD. Started on Ranexa, anti-anginals ?microvascular dz but later stopped while in hospital on abx.  . Complication of anesthesia    hard to wake up-had to be reminded to breath  . GERD (gastroesophageal reflux disease)    a. Severe.  Marland Kitchen HTN (hypertension)   . Hx of cardiovascular stress test    [place] Myoview 8/14:  Normal, EF 74%  . Hx of echocardiogram    Echo 3/16:  Mild LVH, EF 55-60%, Gr 1 DD, trivial MR, mild LAE, normal RVF  . Hypothyroid   . MI (myocardial infarction)   . Migraine   . MRSA infection    a. After vagal nerve stimulator at [facility] - surgical site MRSA infection, PICC placed.  . Obesity   . Palpitations    a. 08/2014: 48 hour holter with 2 PVCs otherwise normal.  . Seizure disorder ([place])    a. since childhood. b. s/p vagal nerve stimulator at [facility].  . Seizures ([place])     Past Surgical History:  Procedure  Laterality Date  . [place] N/A 06/01/2016   Procedure: Right Heart Cath;  Surgeon: Larey Dresser, MD;  Location: [place] CV [facility];  Service: Cardiovascular;  Laterality: N/A;  . CESAREAN SECTION     placement of vagal nerve stimulator.  . CHOLECYSTECTOMY N/A 01/24/2013   Procedure: LAPAROSCOPIC CHOLECYSTECTOMY;  Surgeon: Harl Bowie, MD;  Location: [place];  Service: General;  Laterality: N/A;  . IMPLANTATION VAGAL NERVE STIMULATOR  [phone]   [facility]    There were no vitals filed for this visit.      Subjective Assessment - 08/10/16 1538    Subjective Patient is reports she is feeling better in her ankle today but her back is sore. she had to move obxes after therapy yesterday. She feels pain in her right hip and into her right lower back.    Limitations Standing;Walking   How long can you sit comfortably? > 1 hour    How long can you stand comfortably? < 10 min    How long can you walk comfortably? < 300' without increased pain    Diagnostic tests x-ray: no ankle fx    Patient Stated Goals to have less pain when perfroming activity  Currently in Pain? Yes   Pain Score 3    Pain Location Back   Pain Orientation Mid;Lower   Pain Descriptors / Indicators Aching   Pain Type Acute pain   Pain Onset Today   Pain Frequency Occasional   Aggravating Factors  movement   Pain Relieving Factors rest   Effect of Pain on Daily Activities pain with walking    Multiple Pain Sites No                         [facility] Adult PT Treatment/Exercise - 08/10/16 0001      Lumbar Exercises: Stretches   Single Knee to Chest Stretch Limitations 2x20sec with towel on the right    Lower Trunk Rotation Limitations x10 in low range    Piriformis Stretch Limitations with towell 3x20 seconds each side      Lumbar Exercises: Supine   Clam Limitations yellow 2x10    Other  Supine Lumbar Exercises ; ball squeeze 2x10;      Manual Therapy   Manual therapy comments  PA glide on the right hip; trigger point release to bilateral lumbar spine, Posterior glide on the right to improve right hip flexion; Ankle DF stretch; ankle anterior draw glide. long axis disraction to the right hip.                 PT Education - 08/10/16 1540    Education provided Yes   Education Details improtance of maintaining hip flexion gained    Person(s) Educated Patient   Methods Explanation;Demonstration   Comprehension Verbalized understanding;Returned demonstration;Need further instruction          PT Short Term Goals - 08/01/16 1512      PT SHORT TERM GOAL #1   Title Patient will increase lumbar flexion and extension by 25%    Baseline limited 75% flexion; 50% extension 2/26   Time 4   Period Weeks   Status On-going     PT SHORT TERM GOAL #2   Title Patient will increase gross right ankle strength to 4/5 for ER/IR and DF    Baseline 3/5 DF today    Time 4   Period Weeks   Status On-going     PT SHORT TERM GOAL #3   Title Patient will demsotrate a good core contraction    Baseline Unable to perfrom basic core contraction today    Time 4   Period Weeks   Status On-going     PT SHORT TERM GOAL #4   Title Patient will be independent with initial HEP    Time 4   Period Weeks   Status On-going           PT Long Term Goals - 08/09/16 6734      PT LONG TERM GOAL #1   Title Patient will stand for 1 hour without report of ankle and back pain    Time 8   Period Weeks   Status On-going     PT LONG TERM GOAL #2   Title Patient will go up/down 8 steps without increased pain    Time 8   Period Weeks   Status On-going     PT LONG TERM GOAL #3   Title Patient will ambualte 3000' without increased pain in order to go shopping   Time 8   Period Weeks   Status On-going     PT LONG TERM GOAL #4   Title Patient will  return to gym with a program to  improve core and lower extremity strength    Time 8   Period Weeks   Status On-going               Plan - 08/10/16 1541    Clinical Impression Statement Patient requested to leave early 2nd to a MD appointment. She had liumited right hip flexion before mobilizations. After mobilizations the patients hip mobility incrreased. She felt like she could walk better after the treatment.    Rehab Potential Good   Clinical Impairments Affecting Rehab Potential long standing lower back pain    PT Frequency 2x / week   PT Duration 8 weeks   PT Treatment/Interventions ADLs/Self Care Home Management;Cryotherapy;Electrical Stimulation;Gait training;Stair training;Ultrasound;Traction;Iontophoresis 4mg /ml Dexamethasone;Moist Heat;Therapeutic activities;Therapeutic exercise;Neuromuscular re-education;Patient/family education;Manual techniques;Taping;Splinting;Energy conservation;Dry needling;Passive range of motion   PT Next Visit Plan consider maual therapy to increase hip motion, Continue with HEP; Consider light core strengthening clam shell, ball squeeze, Continue to perfrom manual DF ROM; add light resistance to ankle 4 way, patient is alergic to latex though. Continue to work on seated ankle exercises.     PT Home Exercise Plan LTR, abdominal breathing, single knee to chest stretch, ankle    Consulted and Agree with Plan of Care Patient      Patient will benefit from skilled therapeutic intervention in order to improve the following deficits and impairments:  Abnormal gait, Decreased strength, Decreased mobility, Pain, Increased muscle spasms, Decreased endurance, Decreased safety awareness, Decreased activity tolerance  Visit Diagnosis: Chronic bilateral low back pain without sciatica  Muscle spasm of back  Difficulty in walking, not elsewhere classified     Problem List Patient Active Problem List   Diagnosis Date Noted  . Chest pain 08/04/2016  . Chronic pain syndrome 08/04/2016   . Midline low back pain without sciatica 04/19/2016  . Abnormal weight gain 06/11/2015  . BMI 40.0-44.9, adult ([place]) 06/11/2015  . Spells 01/03/2015  . Asthma 08/24/2014  . Chronic diastolic heart failure ([place]) 06/19/2014  . Allergic rhinitis 05/04/2014  . Severe persistent asthma 05/04/2014  . Pharyngoesophageal dysphagia 01/30/2014  . Chest pain syndrome 07/02/2013  . Hypothyroidism 07/02/2013  . Hypothyroid   . Chronic chest pain   . GERD (gastroesophageal reflux disease) 02/10/2013  . Biliary dyskinesia 12/30/2012  . Exertional dyspnea 09/19/2012  . Status post VNS (vagus nerve stimulator) placement 06/10/2012  . Refusal of blood transfusions as patient is Jehovah's Witness 05/20/2012  . Dyslipidemia 12/29/2011  . 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 Dyslipidemia, goal LDL below 70 05/17/2011  . Non-cardiac chest pain secondary to GERD 04/24/2011  . HTN, goal below 130/80 04/24/2011    Carney Living PT [name]  08/10/2016, 3:44 PM  [facility] [address] [place], [place], [address] Phone: [phone]   Fax:  [phone]  Name: IKEA DEMICCO MRN: [id number] Date of Birth: 1965-12-30

## 2016-08-15 ENCOUNTER — Ambulatory Visit: Payer: Medicare Other | Admitting: Physical Therapy

## 2016-08-16 ENCOUNTER — Ambulatory Visit: Payer: Medicare Other | Admitting: Physical Therapy

## 2016-08-16 ENCOUNTER — Encounter: Payer: Self-pay | Admitting: Physical Therapy

## 2016-08-16 DIAGNOSIS — R262 Difficulty in walking, not elsewhere classified: Secondary | ICD-10-CM

## 2016-08-16 DIAGNOSIS — G8929 Other chronic pain: Secondary | ICD-10-CM

## 2016-08-16 DIAGNOSIS — M545 Low back pain, unspecified: Secondary | ICD-10-CM

## 2016-08-16 DIAGNOSIS — M62830 Muscle spasm of back: Secondary | ICD-10-CM

## 2016-08-16 NOTE — Therapy (Signed)
[place], [place], [address] Phone: [phone]   Fax:  [phone]  Physical Therapy Treatment  Patient Details  Name: [name] MRN: [id number] Date of Birth: 1966-02-20 Referring Provider: Dr Rodell Perna   Encounter Date: 08/16/2016      PT End of Session - 08/16/16 1217    Visit Number 11   Number of Visits 16   Date for PT Re-Evaluation 08/28/16   Authorization Type Medicare   PT Start Time 1148   PT Stop Time 1230   PT Time Calculation (min) 42 min   Activity Tolerance Patient tolerated treatment well   Behavior During Therapy [facility] for tasks assessed/performed      Past Medical History:  Diagnosis Date  . Asthma   . Biliary dyskinesia    a. s/p cholecystectomy.  . 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 Abnl stress [place] 2008, f/u cath reportedly nl. b. ETT-Myoview 04/2011 - EKG changes but normal perfusion. Cor CT - no coronary calcium, no definite stenosis though mRCA not fully evaluated. c. 10/2011 - tn elevated in [place], LHC without CAD. Started on Ranexa, anti-anginals ?microvascular dz but later stopped while in hospital on abx.  . Complication of anesthesia    hard to wake up-had to be reminded to breath  . GERD (gastroesophageal reflux disease)    a. Severe.  Marland Kitchen HTN (hypertension)   . Hx of cardiovascular stress test    Lex Myoview 8/14:  Normal, EF 74%  . Hx of echocardiogram    Echo 3/16:  Mild LVH, EF 55-60%, Gr 1 DD, trivial MR, mild LAE, normal RVF  . Hypothyroid   . MI (myocardial infarction)   . Migraine   . MRSA infection    a. After vagal nerve stimulator at [facility] - surgical site MRSA infection, PICC placed.  . Obesity   . Palpitations    a. 08/2014: 48 hour holter with 2 PVCs otherwise normal.  . Seizure disorder ([place])    a. since childhood. b. s/p vagal nerve stimulator at [facility].  . Seizures ([place])     Past Surgical History:  Procedure  Laterality Date  . [place] N/A 06/01/2016   Procedure: Right Heart Cath;  Surgeon: Larey Dresser, MD;  Location: [place] [facility];  Service: Cardiovascular;  Laterality: N/A;  . CESAREAN SECTION     placement of vagal nerve stimulator.  . CHOLECYSTECTOMY N/A 01/24/2013   Procedure: LAPAROSCOPIC CHOLECYSTECTOMY;  Surgeon: Harl Bowie, MD;  Location: [place];  Service: General;  Laterality: N/A;  . IMPLANTATION VAGAL NERVE STIMULATOR  [phone]   [facility]    There were no vitals filed for this visit.      Subjective Assessment - 08/16/16 1213    Subjective Patient reports her ankle and back are feeling pretty goodtoday. Yesterday both were sore. she can not think of anything that she did to make them sore.    Limitations Standing;Walking   How long can you sit comfortably? > 1 hour    How long can you stand comfortably? < 10 min    How long can you walk comfortably? < 300' without increased pain    Diagnostic tests x-ray: no ankle fx    Patient Stated Goals to have less pain when perfroming activity    Currently in Pain? Yes   Pain  Score 5    Pain Location Back   Pain Orientation Mid;Lower   Pain Descriptors / Indicators Aching   Pain Type Chronic pain   Pain Onset More than a month ago   Pain Frequency Constant   Aggravating Factors  movement    Pain Relieving Factors rest    Effect of Pain on Daily Activities pain with walking   Multiple Pain Sites Yes   Pain Score 3   Pain Location Ankle   Pain Orientation Right   Pain Descriptors / Indicators Aching   Pain Type Chronic pain   Pain Onset More than a month ago   Pain Frequency Constant   Aggravating Factors  standing and walking    Pain Relieving Factors rest    Effect of Pain on Daily Activities difficulty perfroming ADL's                          [facility] Adult PT Treatment/Exercise - 08/16/16  0001      Lumbar Exercises: Stretches   Single Knee to Chest Stretch Limitations 2x20sec with towel on the right    Lower Trunk Rotation Limitations x10 in low range    Piriformis Stretch Limitations with towell 3x20 seconds each side      Lumbar Exercises: Standing   Other Standing Lumbar Exercises shoulder extension red2x10; Scap retraction red 2x10;      Lumbar Exercises: Supine   Clam Limitations yellow 2x10    Bent Knee Raise Limitations 2x10   Other Supine Lumbar Exercises ; ball squeeze 2x10;      Moist Heat Therapy   Number Minutes Moist Heat 10 Minutes   Moist Heat Location Lumbar Spine     Manual Therapy   Manual therapy comments  PA glide on the right hip; trigger point release to bilateral lumbar spine, Posterior glide on the right to improve right hip flexion; Ankle DF stretch; ankle anterior draw glide. long axis disraction to the right hip.      Ankle Exercises: Supine   Other Supine Ankle Exercises supine ankle DF stretch with strap 210x 10 sec; ankle pump 2x10; ev/iv 2x10 red                  PT Education - 08/16/16 1216    Education provided Yes   Education Details improtance of increasing strength to decrease the pain she feels at the end of the day.    Person(s) Educated Patient   Methods Explanation;Demonstration   Comprehension Verbalized understanding;Returned demonstration;Need further instruction          PT Short Term Goals - 08/01/16 1512      PT SHORT TERM GOAL #1   Title Patient will increase lumbar flexion and extension by 25%    Baseline limited 75% flexion; 50% extension 2/26   Time 4   Period Weeks   Status On-going     PT SHORT TERM GOAL #2   Title Patient will increase gross right ankle strength to 4/5 for ER/IR and DF    Baseline 3/5 DF today    Time 4   Period Weeks   Status On-going     PT SHORT TERM GOAL #3   Title Patient will demsotrate a good core contraction    Baseline Unable to perfrom basic core contraction  today    Time 4   Period Weeks   Status On-going     PT SHORT TERM GOAL #4   Title Patient  will be independent with initial HEP    Time 4   Period Weeks   Status On-going           PT Long Term Goals - 08/09/16 0843      PT LONG TERM GOAL #1   Title Patient will stand for 1 hour without report of ankle and back pain    Time 8   Period Weeks   Status On-going     PT LONG TERM GOAL #2   Title Patient will go up/down 8 steps without increased pain    Time 8   Period Weeks   Status On-going     PT LONG TERM GOAL #3   Title Patient will ambualte 3000' without increased pain in order to go shopping   Time 8   Period Weeks   Status On-going     PT LONG TERM GOAL #4   Title Patient will return to gym with a program to improve core and lower extremity strength    Time 8   Period Weeks   Status On-going               Plan - 08/16/16 1447    Clinical Impression Statement Patient continues to make progress. She reports less pain after hip mobilizations. Therapy will continue to progress hip mobilizations as tolerated. She also was able to tolerate exercises better today.     Rehab Potential Good   Clinical Impairments Affecting Rehab Potential long standing lower back pain    PT Frequency 2x / week   PT Duration 8 weeks   PT Treatment/Interventions ADLs/Self Care Home Management;Cryotherapy;Electrical Stimulation;Gait training;Stair training;Ultrasound;Traction;Iontophoresis 4mg /ml Dexamethasone;Moist Heat;Therapeutic activities;Therapeutic exercise;Neuromuscular re-education;Patient/family education;Manual techniques;Taping;Splinting;Energy conservation;Dry needling;Passive range of motion   PT Next Visit Plan consider maual therapy to increase hip motion, Continue with HEP; Consider light core strengthening clam shell, ball squeeze, Continue to perfrom manual DF ROM; add light resistance to ankle 4 way, patient is alergic to latex though. Continue to work on seated  ankle exercises.     PT Home Exercise Plan LTR, abdominal breathing, single knee to chest stretch, ankle    Consulted and Agree with Plan of Care Patient      Patient will benefit from skilled therapeutic intervention in order to improve the following deficits and impairments:  Abnormal gait, Decreased strength, Decreased mobility, Pain, Increased muscle spasms, Decreased endurance, Decreased safety awareness, Decreased activity tolerance  Visit Diagnosis: Chronic bilateral low back pain without sciatica  Muscle spasm of back  Difficulty in walking, not elsewhere classified     Problem List Patient Active Problem List   Diagnosis Date Noted  . Chest pain 08/04/2016  . Chronic pain syndrome 08/04/2016  . Midline low back pain without sciatica 04/19/2016  . Abnormal weight gain 06/11/2015  . BMI 40.0-44.9, adult ([place]) 06/11/2015  . Spells 01/03/2015  . Asthma 08/24/2014  . Chronic diastolic heart failure ([place]) 06/19/2014  . Allergic rhinitis 05/04/2014  . Severe persistent asthma 05/04/2014  . Pharyngoesophageal dysphagia 01/30/2014  . Chest pain syndrome 07/02/2013  . Hypothyroidism 07/02/2013  . Hypothyroid   . Chronic chest pain   . GERD (gastroesophageal reflux disease) 02/10/2013  . Biliary dyskinesia 12/30/2012  . Exertional dyspnea 09/19/2012  . Status post VNS (vagus nerve stimulator) placement 06/10/2012  . Refusal of blood transfusions as patient is Jehovah's Witness 05/20/2012  . Dyslipidemia 12/29/2011  . 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 Dyslipidemia, goal LDL below 70 05/17/2011  .  Non-cardiac chest pain secondary to GERD 04/24/2011  . HTN, goal below 130/80 04/24/2011    Carney Living PT [name]  08/16/2016, 2:51 PM  [facility] [address] [place], [place], [address] Phone: [phone]   Fax:  [phone]  Name: [name] MRN: [id number] Date of  Birth: 01-16-1966

## 2016-08-17 ENCOUNTER — Encounter: Payer: Self-pay | Admitting: Cardiology

## 2016-08-18 ENCOUNTER — Ambulatory Visit: Payer: Medicare Other | Admitting: Physical Therapy

## 2016-08-18 ENCOUNTER — Encounter: Payer: Self-pay | Admitting: Physical Therapy

## 2016-08-18 DIAGNOSIS — M545 Low back pain, unspecified: Secondary | ICD-10-CM

## 2016-08-18 DIAGNOSIS — R262 Difficulty in walking, not elsewhere classified: Secondary | ICD-10-CM

## 2016-08-18 DIAGNOSIS — G8929 Other chronic pain: Secondary | ICD-10-CM

## 2016-08-18 DIAGNOSIS — M62830 Muscle spasm of back: Secondary | ICD-10-CM

## 2016-08-18 NOTE — Therapy (Signed)
[place], [place], [address] Phone: [phone]   Fax:  [phone]  Physical Therapy Treatment  Patient Details  Name: [name] MRN: [id number] Date of Birth: 10-29-1965 Referring Provider: Dr Rodell Perna   Encounter Date: 08/18/2016      PT End of Session - 08/18/16 0921    Visit Number 12   Number of Visits 16   Date for PT Re-Evaluation 08/28/16   Authorization Type Medicare   PT Start Time 0851   PT Stop Time 0940   PT Time Calculation (min) 49 min   Activity Tolerance Patient tolerated treatment well   Behavior During Therapy [facility] for tasks assessed/performed      Past Medical History:  Diagnosis Date  . Asthma   . Biliary dyskinesia    a. s/p cholecystectomy.  . 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 Abnl stress [place] 2008, f/u cath reportedly nl. b. ETT-Myoview 04/2011 - EKG changes but normal perfusion. Cor CT - no coronary calcium, no definite stenosis though mRCA not fully evaluated. c. 10/2011 - [place], LHC without CAD. Started on Ranexa, anti-anginals ?microvascular dz but later stopped while in hospital on abx.  . Complication of anesthesia    hard to wake up-had to be reminded to breath  . GERD (gastroesophageal reflux disease)    a. Severe.  Marland Kitchen HTN (hypertension)   . Hx of cardiovascular stress test    Lex Myoview 8/14:  Normal, EF 74%  . Hx of echocardiogram    Echo 3/16:  Mild LVH, EF 55-60%, Gr 1 DD, trivial MR, mild LAE, normal RVF  . Hypothyroid   . MI (myocardial infarction)   . Migraine   . MRSA infection    a. After vagal nerve stimulator at [facility] - surgical site MRSA infection, PICC placed.  . Obesity   . Palpitations    a. 08/2014: 48 hour holter with 2 PVCs otherwise normal.  . Seizure disorder ([place])    a. since childhood. b. s/p vagal nerve stimulator at [facility].  . Seizures ([place])     Past Surgical History:  Procedure  Laterality Date  . [place] N/A 06/01/2016   Procedure: Right Heart Cath;  Surgeon: Larey Dresser, MD;  Location: [place] [facility];  Service: Cardiovascular;  Laterality: N/A;  . CESAREAN SECTION     placement of vagal nerve stimulator.  . CHOLECYSTECTOMY N/A 01/24/2013   Procedure: LAPAROSCOPIC CHOLECYSTECTOMY;  Surgeon: Harl Bowie, MD;  Location: [place];  Service: General;  Laterality: N/A;  . IMPLANTATION VAGAL NERVE STIMULATOR  [phone]   [facility]    There were no vitals filed for this visit.      Subjective Assessment - 08/18/16 0911    Subjective Patient reports that last night her lower back became sore. She had done some activity yesterday. Her ankle is not too sore today., she has been doing her exercises.    Limitations Standing;Walking   How long can you sit comfortably? > 1 hour    How long can you stand comfortably? < 10 min    How long can you walk comfortably? < 300' without increased pain    Diagnostic tests x-ray: no ankle fx    Patient Stated Goals to have less pain when perfroming activity    Currently in Pain? Yes  Pain Score 7    Pain Location Back   Pain Orientation Lower   Pain Descriptors / Indicators Aching   Pain Type Chronic pain   Pain Onset More than a month ago   Pain Frequency Constant   Aggravating Factors  movement    Pain Relieving Factors rest    Effect of Pain on Daily Activities pain and walking    Multiple Pain Sites Yes   Pain Score 2   Pain Location Ankle   Pain Orientation Right   Pain Descriptors / Indicators Aching   Pain Onset More than a month ago   Pain Frequency Constant   Aggravating Factors  standing and walking    Pain Relieving Factors rest    Effect of Pain on Daily Activities diffciulty perfroming ADL's                          [facility] Adult PT Treatment/Exercise - 08/18/16 0001       Lumbar Exercises: Stretches   Single Knee to Chest Stretch Limitations 2x20sec with towel on the right    Lower Trunk Rotation Limitations x10 in low range    Piriformis Stretch Limitations with towell 3x20 seconds each side      Moist Heat Therapy   Number Minutes Moist Heat 10 Minutes   Moist Heat Location Lumbar Spine     Manual Therapy   Manual therapy comments  PA glide on the right hip; trigger point release to bilateral lumbar spine, Posterior glide on the right to improve right hip flexion; Ankle DF stretch; ankle anterior draw glide. long axis disraction to the right hip.                 PT Education - 08/18/16 [phone]    Education provided Yes   Education Details continue with stretching and strengthening exercises.    Person(s) Educated Patient   Methods Explanation;Demonstration   Comprehension Verbalized understanding;Returned demonstration;Need further instruction          PT Short Term Goals - 08/18/16 1224      PT SHORT TERM GOAL #1   Title Patient will increase lumbar flexion and extension by 25%    Baseline limited 50%    Time 4   Period Weeks   Status Achieved     PT SHORT TERM GOAL #2   Title Patient will increase gross right ankle strength to 4/5 for ER/IR and DF    Baseline DF still 3/5 3/16   Time 4   Period Weeks   Status On-going     PT SHORT TERM GOAL #3   Title Patient will demsotrate a good core contraction    Baseline Unable to perfrom basic core contraction today    Time 4   Period Weeks   Status On-going     PT SHORT TERM GOAL #4   Title Patient will be independent with initial HEP    Baseline indepenent with intial HEP    Time 4   Period Weeks   Status On-going           PT Long Term Goals - 08/09/16 6010      PT LONG TERM GOAL #1   Title Patient will stand for 1 hour without report of ankle and back pain    Time 8   Period Weeks   Status On-going     PT LONG TERM GOAL #2   Title Patient will go up/down 8 steps  without increased pain    Time 8   Period Weeks   Status On-going     PT LONG TERM GOAL #3   Title Patient will ambualte 3000' without increased pain in order to go shopping   Time 8   Period Weeks   Status On-going     PT LONG TERM GOAL #4   Title Patient will return to gym with a program to improve core and lower extremity strength    Time 8   Period Weeks   Status On-going               Plan - 08/18/16 6063    Clinical Impression Statement Patient felt less pain after treatment. Therapy focused on increasing left hip mobility. She was encouraged to contoinue her stretchign and trengthening at home ecspeccialy when she has a good increase in mobility at therapy. The patient was 6 minutes lsate for her appointment.    Clinical Impairments Affecting Rehab Potential long standing lower back pain    PT Frequency 2x / week   PT Duration 8 weeks   PT Treatment/Interventions ADLs/Self Care Home Management;Cryotherapy;Electrical Stimulation;Gait training;Stair training;Ultrasound;Traction;Iontophoresis 4mg /ml Dexamethasone;Moist Heat;Therapeutic activities;Therapeutic exercise;Neuromuscular re-education;Patient/family education;Manual techniques;Taping;Splinting;Energy conservation;Dry needling;Passive range of motion   PT Next Visit Plan consider maual therapy to increase hip motion, Continue with HEP; Consider light core strengthening clam shell, ball squeeze, Continue to perfrom manual DF ROM; add light resistance to ankle 4 way, patient is alergic to latex though. Continue to work on seated ankle exercises.     PT Home Exercise Plan LTR, abdominal breathing, single knee to chest stretch, ankle    Consulted and Agree with Plan of Care Patient      Patient will benefit from skilled therapeutic intervention in order to improve the following deficits and impairments:  Abnormal gait, Decreased strength, Decreased mobility, Pain, Increased muscle spasms, Decreased endurance, Decreased  safety awareness, Decreased activity tolerance  Visit Diagnosis: Chronic bilateral low back pain without sciatica  Muscle spasm of back  Difficulty in walking, not elsewhere classified     Problem List Patient Active Problem List   Diagnosis Date Noted  . Chest pain 08/04/2016  . Chronic pain syndrome 08/04/2016  . Midline low back pain without sciatica 04/19/2016  . Abnormal weight gain 06/11/2015  . BMI 40.0-44.9, adult ([place]) 06/11/2015  . Spells 01/03/2015  . Asthma 08/24/2014  . Chronic diastolic heart failure ([place]) 06/19/2014  . Allergic rhinitis 05/04/2014  . Severe persistent asthma 05/04/2014  . Pharyngoesophageal dysphagia 01/30/2014  . Chest pain syndrome 07/02/2013  . Hypothyroidism 07/02/2013  . Hypothyroid   . Chronic chest pain   . GERD (gastroesophageal reflux disease) 02/10/2013  . Biliary dyskinesia 12/30/2012  . Exertional dyspnea 09/19/2012  . Status post VNS (vagus nerve stimulator) placement 06/10/2012  . Refusal of blood transfusions as patient is Jehovah's Witness 05/20/2012  . Dyslipidemia 12/29/2011  . 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 Dyslipidemia, goal LDL below 70 05/17/2011  . Non-cardiac chest pain secondary to GERD 04/24/2011  . HTN, goal below 130/80 04/24/2011    Carney Living PT [name]  08/18/2016, 12:26 PM  [facility] [address] [place], [place], [address] Phone: [phone]   Fax:  [phone]  Name: [name] MRN: [id number] Date of Birth: [birth year]

## 2016-08-25 ENCOUNTER — Ambulatory Visit: Payer: Medicare Other | Admitting: Physical Therapy

## 2016-08-25 DIAGNOSIS — M62830 Muscle spasm of back: Secondary | ICD-10-CM

## 2016-08-25 DIAGNOSIS — M545 Low back pain, unspecified: Secondary | ICD-10-CM

## 2016-08-25 DIAGNOSIS — R262 Difficulty in walking, not elsewhere classified: Secondary | ICD-10-CM

## 2016-08-25 DIAGNOSIS — G8929 Other chronic pain: Secondary | ICD-10-CM

## 2016-08-25 NOTE — Therapy (Signed)
[place], [place], [address] Phone: [phone]   Fax:  [phone]  Physical Therapy Treatment  Patient Details  Name: [name] MRN: [id number] Date of Birth: November 14, 1965 Referring Provider: Dr Rodell Perna   Encounter Date: 08/25/2016      PT End of Session - 08/25/16 1224    Visit Number 13   Number of Visits 16   Date for PT Re-Evaluation 08/28/16   Authorization Type Medicare   PT Start Time 0938   PT Stop Time 1027   PT Time Calculation (min) 49 min   Activity Tolerance Patient tolerated treatment well   Behavior During Therapy [facility] for tasks assessed/performed      Past Medical History:  Diagnosis Date  . Asthma   . Biliary dyskinesia    a. s/p cholecystectomy.  . 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 Abnl stress [place] 2008, f/u cath reportedly nl. b. ETT-Myoview 04/2011 - EKG changes but normal perfusion. Cor CT - no coronary calcium, no definite stenosis though mRCA not fully evaluated. c. 10/2011 - tn elevated in [place], LHC without CAD. Started on Ranexa, anti-anginals ?microvascular dz but later stopped while in hospital on abx.  . Complication of anesthesia    hard to wake up-had to be reminded to breath  . GERD (gastroesophageal reflux disease)    a. Severe.  [place] HTN (hypertension)   . Hx of cardiovascular stress test    Lex Myoview 8/14:  Normal, EF 74%  . Hx of echocardiogram    Echo 3/16:  Mild LVH, EF 55-60%, Gr 1 DD, trivial MR, mild LAE, normal RVF  . Hypothyroid   . MI (myocardial infarction)   . Migraine   . MRSA infection    a. After vagal nerve stimulator at [facility] - surgical site MRSA infection, PICC placed.  . Obesity   . Palpitations    a. 08/2014: 48 hour holter with 2 PVCs otherwise normal.  . Seizure disorder ([place])    a. since childhood. b. s/p vagal nerve stimulator at [facility].  . Seizures ([place])     Past Surgical History:  Procedure  Laterality Date  . [place] N/A 06/01/2016   Procedure: Right Heart Cath;  Surgeon: Larey Dresser, MD;  Location: [place] [facility];  Service: Cardiovascular;  Laterality: N/A;  . CESAREAN SECTION     placement of vagal nerve stimulator.  . CHOLECYSTECTOMY N/A 01/24/2013   Procedure: LAPAROSCOPIC CHOLECYSTECTOMY;  Surgeon: Harl Bowie, MD;  Location: [place];  Service: General;  Laterality: N/A;  . IMPLANTATION VAGAL NERVE STIMULATOR  [phone]   [facility]    There were no vitals filed for this visit.      Subjective Assessment - 08/25/16 0942    Subjective Patient feels like her back is about a 6-7/10 pain. Her ankle pain is about a 4/10 pain. She reports no major imporovement. The patient was 8 minutes late for her appointment. She has had a fluid buildup over the past few weeks whcih she feels like may be contributing to her ankle pain.    Limitations Standing;Walking   How long can you sit comfortably? > 1 hour    How long can you stand comfortably? < 10 min    How long can you walk comfortably? < 300' without increased pain    Diagnostic tests  x-ray: no ankle fx    Patient Stated Goals to have less pain when perfroming activity    Pain Score 7    Pain Location Back   Pain Orientation Lower   Pain Descriptors / Indicators Aching   Pain Type Chronic pain   Pain Onset More than a month ago   Pain Frequency Constant   Aggravating Factors  movement    Pain Relieving Factors rest    Effect of Pain on Daily Activities pain and wallking    Pain Score 4   Pain Location Ankle   Pain Orientation Right   Pain Descriptors / Indicators Aching   Pain Type Chronic pain   Pain Onset More than a month ago   Pain Frequency Constant   Aggravating Factors  standing and walking    Pain Relieving Factors rest    Effect of Pain on Daily Activities difficulty perfroming ADL's                           [facility] Adult PT Treatment/Exercise - 08/25/16 0001      Lumbar Exercises: Stretches   Single Knee to Chest Stretch Limitations 2x20sec with towel on the right    Lower Trunk Rotation Limitations x10 in low range    Piriformis Stretch Limitations with towell 3x20 seconds each side      Moist Heat Therapy   Number Minutes Moist Heat 10 Minutes   Moist Heat Location Lumbar Spine     Manual Therapy   Manual therapy comments  PA glide on the right hip; trigger point release to bilateral lumbar spine, Posterior glide on the right to improve right hip flexion; Ankle DF stretch; ankle anterior draw glide. long axis disraction to the right hip. PROM of the right ankle/ anteriro drawer glide.                  PT Education - 08/25/16 0944    Education provided Yes   Education Details continue with stretching and strengthening exercises.    Person(s) Educated Patient   Methods Explanation;Demonstration   Comprehension Verbalized understanding;Returned demonstration;Need further instruction          PT Short Term Goals - 08/18/16 1224      PT SHORT TERM GOAL #1   Title Patient will increase lumbar flexion and extension by 25%    Baseline limited 50%    Time 4   Period Weeks   Status Achieved     PT SHORT TERM GOAL #2   Title Patient will increase gross right ankle strength to 4/5 for ER/IR and DF    Baseline DF still 3/5 3/16   Time 4   Period Weeks   Status On-going     PT SHORT TERM GOAL #3   Title Patient will demsotrate a good core contraction    Baseline Unable to perfrom basic core contraction today    Time 4   Period Weeks   Status On-going     PT SHORT TERM GOAL #4   Title Patient will be independent with initial HEP    Baseline indepenent with intial HEP    Time 4   Period Weeks   Status On-going           PT Long Term Goals - 08/09/16 8527      PT LONG TERM GOAL #1   Title Patient will stand for 1 hour  without report of ankle and back pain  Time 8   Period Weeks   Status On-going     PT LONG TERM GOAL #2   Title Patient will go up/down 8 steps without increased pain    Time 8   Period Weeks   Status On-going     PT LONG TERM GOAL #3   Title Patient will ambualte 3000' without increased pain in order to go shopping   Time 8   Period Weeks   Status On-going     PT LONG TERM GOAL #4   Title Patient will return to gym with a program to improve core and lower extremity strength    Time 8   Period Weeks   Status On-going               Plan - 08/25/16 1230    Clinical Impression Statement patients ankle ROM is improving. Therapy focused on manual therapy to the low back, hip and ankle today. Patient was 8 minutes late for her appointment. She was strongly advised to do her exercises at home.    Rehab Potential Good   Clinical Impairments Affecting Rehab Potential long standing lower back pain    PT Frequency 2x / week   PT Duration 8 weeks   PT Treatment/Interventions ADLs/Self Care Home Management;Cryotherapy;Electrical Stimulation;Gait training;Stair training;Ultrasound;Traction;Iontophoresis 4mg /ml Dexamethasone;Moist Heat;Therapeutic activities;Therapeutic exercise;Neuromuscular re-education;Patient/family education;Manual techniques;Taping;Splinting;Energy conservation;Dry needling;Passive range of motion   PT Next Visit Plan consider maual therapy to increase hip motion, Continue with HEP; Consider light core strengthening clam shell, ball squeeze, Continue to perfrom manual DF ROM; add light resistance to ankle 4 way, patient is alergic to latex though. Continue to work on seated ankle exercises.     PT Home Exercise Plan LTR, abdominal breathing, single knee to chest stretch, ankle    Consulted and Agree with Plan of Care Patient      Patient will benefit from skilled therapeutic intervention in order to improve the following deficits and impairments:  Abnormal gait,  Decreased strength, Decreased mobility, Pain, Increased muscle spasms, Decreased endurance, Decreased safety awareness, Decreased activity tolerance  Visit Diagnosis: Chronic bilateral low back pain without sciatica  Muscle spasm of back  Difficulty in walking, not elsewhere classified     Problem List Patient Active Problem List   Diagnosis Date Noted  . Chest pain 08/04/2016  . Chronic pain syndrome 08/04/2016  . Midline low back pain without sciatica 04/19/2016  . Abnormal weight gain 06/11/2015  . BMI 40.0-44.9, adult ([place]) 06/11/2015  . Spells 01/03/2015  . Asthma 08/24/2014  . Chronic diastolic heart failure ([place]) 06/19/2014  . Allergic rhinitis 05/04/2014  . Severe persistent asthma 05/04/2014  . Pharyngoesophageal dysphagia 01/30/2014  . Chest pain syndrome 07/02/2013  . Hypothyroidism 07/02/2013  . Hypothyroid   . Chronic chest pain   . GERD (gastroesophageal reflux disease) 02/10/2013  . Biliary dyskinesia 12/30/2012  . Exertional dyspnea 09/19/2012  . Status post VNS (vagus nerve stimulator) placement 06/10/2012  . Refusal of blood transfusions as patient is Jehovah's Witness 05/20/2012  . Dyslipidemia 12/29/2011  . 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 Dyslipidemia, goal LDL below 70 05/17/2011  . Non-cardiac chest pain secondary to GERD 04/24/2011  . HTN, goal below 130/80 04/24/2011    Carney Living PT [name] 08/25/2016, 12:35 PM  [facility] [address] [place], [place], [address] Phone: [phone]   Fax:  [phone]  Name: [name] MRN: [id number] Date of Birth: [birth year]

## 2016-08-28 DIAGNOSIS — E039 Hypothyroidism, unspecified: Secondary | ICD-10-CM | POA: Diagnosis not present

## 2016-08-28 DIAGNOSIS — I2510 Atherosclerotic heart disease of native coronary artery without angina pectoris: Secondary | ICD-10-CM | POA: Diagnosis not present

## 2016-08-28 DIAGNOSIS — I110 Hypertensive heart disease with heart failure: Secondary | ICD-10-CM | POA: Diagnosis not present

## 2016-08-28 DIAGNOSIS — I5032 Chronic diastolic (congestive) heart failure: Secondary | ICD-10-CM | POA: Diagnosis not present

## 2016-08-28 DIAGNOSIS — E7800 Pure hypercholesterolemia, unspecified: Secondary | ICD-10-CM | POA: Diagnosis not present

## 2016-08-28 DIAGNOSIS — E119 Type 2 diabetes mellitus without complications: Secondary | ICD-10-CM | POA: Diagnosis not present

## 2016-08-28 DIAGNOSIS — I10 Essential (primary) hypertension: Secondary | ICD-10-CM | POA: Diagnosis not present

## 2016-08-28 DIAGNOSIS — M542 Cervicalgia: Secondary | ICD-10-CM | POA: Diagnosis not present

## 2016-08-29 ENCOUNTER — Encounter: Payer: Self-pay | Admitting: Cardiology

## 2016-08-29 ENCOUNTER — Ambulatory Visit (INDEPENDENT_AMBULATORY_CARE_PROVIDER_SITE_OTHER): Payer: Medicare Other | Admitting: Cardiology

## 2016-08-29 VITALS — BP 124/80 | HR 79 | Ht 65.0 in | Wt 294.0 lb

## 2016-08-29 DIAGNOSIS — I10 Essential (primary) hypertension: Secondary | ICD-10-CM | POA: Diagnosis not present

## 2016-08-29 DIAGNOSIS — R109 Unspecified abdominal pain: Secondary | ICD-10-CM

## 2016-08-29 DIAGNOSIS — I5033 Acute on chronic diastolic (congestive) heart failure: Secondary | ICD-10-CM | POA: Diagnosis not present

## 2016-08-29 DIAGNOSIS — I5032 Chronic diastolic (congestive) heart failure: Secondary | ICD-10-CM

## 2016-08-29 LAB — CBC WITH DIFFERENTIAL/PLATELET
Basophils Absolute: 0 10*3/uL (ref 0.0–0.2)
Basos: 1 %
EOS (ABSOLUTE): 0.2 10*3/uL (ref 0.0–0.4)
Eos: 3 %
Hematocrit: 38.2 % (ref 34.0–46.6)
Hemoglobin: 12.6 g/dL (ref 11.1–15.9)
Immature Grans (Abs): 0 10*3/uL (ref 0.0–0.1)
Immature Granulocytes: 0 %
Lymphocytes Absolute: 2.3 10*3/uL (ref 0.7–3.1)
Lymphs: 37 %
MCH: 27 pg (ref 26.6–33.0)
MCHC: 33 g/dL (ref 31.5–35.7)
MCV: 82 fL (ref 79–97)
Monocytes Absolute: 0.5 10*3/uL (ref 0.1–0.9)
Monocytes: 7 %
Neutrophils Absolute: 3.3 10*3/uL (ref 1.4–7.0)
Neutrophils: 52 %
Platelets: 238 10*3/uL (ref 150–379)
RBC: 4.66 x10E6/uL (ref 3.77–5.28)
RDW: 15.3 % (ref 12.3–15.4)
WBC: 6.3 10*3/uL (ref 3.4–10.8)

## 2016-08-29 LAB — BASIC METABOLIC PANEL
BUN/Creatinine Ratio: 17 (ref 9–23)
BUN: 15 mg/dL (ref 6–24)
CO2: 22 mmol/L (ref 18–29)
Calcium: 9.5 mg/dL (ref 8.7–10.2)
Chloride: 104 mmol/L (ref 96–106)
Creatinine, Ser: 0.89 mg/dL (ref 0.57–1.00)
GFR calc Af Amer: 86 mL/min/{1.73_m2} (ref >59)
GFR calc non Af Amer: 75 mL/min/{1.73_m2} (ref >59)
Glucose: 84 mg/dL (ref 65–99)
Potassium: 4.3 mmol/L (ref 3.5–5.2)
Sodium: 143 mmol/L (ref 134–144)

## 2016-08-29 LAB — URINALYSIS
Bilirubin, UA: NEGATIVE
Glucose, UA: NEGATIVE
Ketones, UA: NEGATIVE
Leukocytes, UA: NEGATIVE
Nitrite, UA: NEGATIVE
Protein, UA: NEGATIVE
RBC, UA: NEGATIVE
Specific Gravity, UA: 1.022 (ref 1.005–1.030)
Urobilinogen, Ur: 1 mg/dL (ref 0.2–1.0)
pH, UA: 7 (ref 5.0–7.5)

## 2016-08-29 LAB — PRO B NATRIURETIC PEPTIDE: NT-Pro BNP: 21 pg/mL (ref 0–249)

## 2016-08-29 MED ORDER — FUROSEMIDE 40 MG PO TABS: 40.0000 mg | ORAL_TABLET | Freq: Two times a day (BID) | ORAL | 1 refills | Status: DC

## 2016-08-29 NOTE — Progress Notes (Signed)
Cardiology Office Note    Date:  08/29/2016   ID:  Richardson, Abigail Feb 28, 1966, MRN [id number]  PCP:  Abigail Fus, MD  Cardiologist:  Abigail Alexander, MD   Chief complain: Dr [name] --> Dr [name]  History of Present Illness:  [name] is a 51 y.o. female with history of chest pain and prior negative caths as well as migraines, asthma, and HTN presents for cardiology followup.  She has had chest pain chronically over a number of years.  She had an abnormal stress test in [name] in 2008.  She was then sent for a cardiac cath, which she says was normal.  We had her do an ETT-myoview in this office in 11/12.  This showed ischemic-type ST depression on exercise ECG, but there was no evidence for ischemia on myoview images.  EF was 66%.  Given ongoing chest pain and ischemic ECG changes with normal perfusion images (equivocal study), I had her get a coronary CT angiogram.  This was a difficult study because we were not able to get her HR as low as would be ideal.  However, she had no coronary calcium and no definite stenosis was seen (though mid-RCA was not fully evaluated due to artifact).  I think that this was a negative study.  She continued to have chest tightness with walking long distances, climbing a flight of steps, and housework.  In 5/13, she went to the ER in [place] with particularly bad exertional chest pain that lasted a prolonged time.  Troponin was mildly elevated with normal CKMB.  She had a left heart cath in the hospital and [place] which showed no angiographic CAD.  She was sent home on Imdur and amlodipine.  Echo showed normal EF.  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  She stopped the amlodipine due to ankle swelling.    In 12/13, she had her vagal nerve stimulator replaced at [facility].  This was complicated by a surgical site MRSA infection, and she had a PICC line placed for antibiotics.  Ranolazine was  stopped at [facility] while she was getting antibiotics.  She did not notice much change in her chest pain pattern off ranolazine.    Last Cardiolite in 10/17 showed EF 49% but normal perfusion.  Echo in 10/17 done because of low EF on Cardiolite showed normal EF, 60-65%.    08/29/2016 - the patient is coming after 3 months, this is the first visit with me as she is transitioning from Dr. Shirlee Richardson. The patient states that she was recently hospitalized for chest pain on 08/05/2016, she ruled out for ACS, her EKG was unchanged, she was consulted by cardiology who recommended no ischemic workup at this time. She is coming after 3 weeks post discharge she states that she hasn't had any more episodes of chest pains however she is being experiencing progressively worsening weight gain. She states that the last few months every months she gained about 10 pounds. She has some mild chronic lower extremity edema denies any orthopnea or proximal nocturnal dyspnea. Her BNP while in the hospital was 22. She denies any palpitations or syncope. She has been compliant with her medications. She is also complaining of right-sided flank pain she is not sure if she has dysuria she has urinary incontinence.   Labs (9/12): creatinine 0.9 Labs (11/12): LDL 130, HDL 49, TSH normal, K 3.6, creatinine 0.8, Lp(a) 31 Labs (5/13): K 4, creatinine 0.7,  LDL 91, TnI 0.86 => 0.98 with normal CKMB Labs (7/13): negative troponin Labs (12/13): TSH normal Labs (2/14): CPK 215, K 3.8, creatinine 0.9 Labs (3/14): BNP 29 Labs (9/16): K 3.7, creatinine 0.7, BNP 12 Labs (10/17): K 3.7, creatinine 0.7, pro-BNP 12  PMH:  1. Chest pain: Multiple episodes over the years.  She had an abnormal stress test in 2008 in [name] and it sounds like she went to [name] for a cardiac catheterization which per her report was normal.   Stress echo at [name] in 2010 was submaximal but showed no evidence for ischemia.  ETT-myoview (11/12): 7'15", EF 66%,  significant ST depression on exercise ECG, apical thinning on myoview images but no evidence for ischemia. Coronary CT angiogram (11/12): calcium score 0, difficult study with gating artifact, mid RCA nonevaluatable but no stenosis elsewhere in the coronaries.   Echo (11/12): EF 55-60%, mild LV hypertrophy, mild MR.  NSTEMI in [place] in 5/13 with elevated troponin but LHC showed normal coronaries.  Echo (5/13) with EF 60-65%, normal valve, no LVH.  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  Cardiolite (8/14) with EF 74%, no ischemia or infarction.  Echo (3/16) with EF 55-60%, mild LVH, normal RV size and systolic function.  - Cardiolite (10/17) with EF 49%, no ischemia/infarction.  - Echo (10/17) with EF 60-65%, grade II diastolic dysfunction.  2. Migraines: Uses triptan medications.  3. HTN: edema with amlodipine.  4. Seizure disorder: Has vagus nerve stimulator.  5. Hypothyroidism 6. Asthma since childhood 7. PVCs: Documented by holter in 3/16.   SH: [place]oved [name] from [name].  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  Nonsmoker.   FH: Mother with MI at 25.  Grandmother with MI at 78.   ROS: All systems reviewed and negative except as per HPI.     Past Medical History:  Diagnosis Date  . Asthma   . Biliary dyskinesia    a. s/p cholecystectomy.  . 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 Abnl stress [place] 2008, f/u cath reportedly nl. b. ETT-Myoview 04/2011 - EKG changes but normal perfusion. Cor CT - no coronary calcium, no definite stenosis though mRCA not fully evaluated. c. 10/2011 - tn elevated in [place], LHC without CAD. Started on Ranexa, anti-anginals ?microvascular dz but later stopped while in hospital on abx.  . Complication of anesthesia    hard to wake up-had to be reminded to breath  . GERD (gastroesophageal reflux disease)    a. Severe.  [name] HTN (hypertension)   . Hx of cardiovascular stress test    Lex Myoview  8/14:  Normal, EF 74%  . Hx of echocardiogram    Echo 3/16:  Mild LVH, EF 55-60%, Gr 1 DD, trivial MR, mild LAE, normal RVF  . Hypothyroid   . MI (myocardial infarction)   . Migraine   . MRSA infection    a. After vagal nerve stimulator at [facility] - surgical site MRSA infection, PICC placed.  . Obesity   . Palpitations    a. 08/2014: 48 hour holter with 2 PVCs otherwise normal.  . Seizure disorder ([facility])    a. since childhood. b. s/p vagal nerve stimulator at [facility].  . Seizures ([facility])     Past Surgical History:  Procedure Laterality Date  . CARDIAC CATHETERIZATION     [facility]   . CARDIAC CATHETERIZATION N/A 06/01/2016   Procedure: Right Heart Cath;  Surgeon: Laurey Morale,  MD;  [facility];  Service: Cardiovascular;  Laterality: N/A;  . CESAREAN SECTION     placement of vagal nerve stimulator.  . CHOLECYSTECTOMY N/A 01/24/2013   Procedure: LAPAROSCOPIC CHOLECYSTECTOMY;  Surgeon: Shelly Rubenstein, MD;  Location: [place] [facility];  Service: General;  Laterality: N/A;  . IMPLANTATION VAGAL NERVE STIMULATOR  [phone]   [facility]    Current Medications: Outpatient Medications Prior to Visit  Medication Sig Dispense Refill  . acetaminophen (TYLENOL) 325 MG tablet Take 975 mg by mouth every 6 (six) hours as needed for headache.    . albuterol (PROAIR HFA) 108 (90 BASE) MCG/ACT inhaler Inhale 2 puffs into the lungs every 4 (four) hours as needed for wheezing or shortness of breath.     . baclofen (LIORESAL) 10 MG tablet Take 10 mg by mouth 2 (two) times daily as needed for muscle spasms.   0  . carvedilol (COREG) 25 MG tablet Take 1 tablet (25 mg total) by mouth 2 (two) times daily with a meal. 60 tablet 1  . DULoxetine (CYMBALTA) 30 MG capsule Take 1 capsule by mouth 2 (two) times daily.   2  . fluticasone (FLONASE) 50 MCG/ACT nasal spray Place 1 spray into both nostrils 2 (two) times daily as needed for allergies.     .  isosorbide mononitrate (IMDUR) 60 MG 24 hr tablet TAKE 1 TABLET (60 MG TOTAL) BY MOUTH 2 (TWO) TIMES DAILY. 60 tablet 6  . levothyroxine (SYNTHROID, LEVOTHROID) 50 MCG tablet Take 50 mcg by mouth daily.     [name] losartan (COZAAR) 100 MG tablet Take 1 tablet (100 mg total) by mouth daily. 90 tablet 3  . mometasone-formoterol (DULERA) 100-5 MCG/ACT AERO Inhale 2 puffs into the lungs 2 (two) times daily.    . montelukast (SINGULAIR) 10 MG tablet Take 10 mg by mouth daily as needed (for allergies).     . nitroGLYCERIN (NITRODUR - DOSED IN MG/24 HR) 0.2 mg/hr patch PLACE 1 PATCH ONTO THE SKIN DAILY. 30 patch 1  . nitroGLYCERIN (NITROSTAT) 0.4 MG SL tablet PLACE 1 TABLET (0.4 MG TOTAL) UNDER THE TONGUE EVERY 5 (FIVE) MINUTES AS NEEDED FOR CHEST PAIN. 25 tablet 3  . pantoprazole (PROTONIX) 40 MG tablet Take 1 tablet (40 mg total) by mouth 2 (two) times daily. 60 tablet 2  . promethazine (PHENERGAN) 25 MG tablet Take 25 mg by mouth every 4 (four) hours as needed for nausea or vomiting.     . sucralfate (CARAFATE) 1 g tablet Take 1 g by mouth 4 (four) times daily.  2  . topiramate (TOPAMAX) 100 MG tablet Take 200 mg by mouth 2 (two) times daily.    . traZODone (DESYREL) 50 MG tablet Take 50 mg by mouth at bedtime.    [name] venlafaxine (EFFEXOR) 75 MG tablet Take 75 mg by mouth 2 (two) times daily.    . verapamil (CALAN) 120 MG tablet Take 120 mg by mouth 3 (three) times daily.    . Vitamin D, Ergocalciferol, (DRISDOL) 50000 units CAPS capsule Take 95621 units once weekly on monday  0  . furosemide (LASIX) 40 MG tablet Take 40 mg by mouth daily.   0  . ondansetron (ZOFRAN) 4 MG tablet Take 1 tablet (4 mg total) by mouth every 6 (six) hours. (Patient not taking: Reported on 08/29/2016) 12 tablet 0   No facility-administered medications prior to visit.      Allergies:   Amantadines; Amitriptyline; Amlodipine; Aspirin; Latex; Simvastatin; and  Tramadol   Social History   Social History  . Marital status: Single      Spouse name: N/A  . Number of children: 2  . Years of education: N/A   Social History Main Topics  . Smoking status: Never Smoker  . Smokeless tobacco: Never Used  . Alcohol use 0.0 oz/week    1 - 3 Glasses of wine per week     Comment: occasional  . Drug use: No  . Sexual activity: Yes    Birth control/ protection: None   Other Topics Concern  . None   Social History Narrative   Lives alone     Family History:  The patient's family history includes Cancer in her other; Coronary artery disease in her maternal grandmother; Coronary artery disease (age of onset: 60) in her mother; Hypertension in her other; Stroke in her other.   ROS:   Please Richardson the history of present illness.    ROS All other systems reviewed and are negative.   PHYSICAL EXAM:   VS:  BP 124/80   Pulse 79   Ht 5\' 5"  (1.651 m)   Wt 294 lb (133.4 kg)   SpO2 98%   BMI 48.92 kg/m    GEN: Well nourished, morbidly obese, in no acute distress  HEENT: normal  Neck: no JVD, carotid bruits, or masses Cardiac: RRR; no murmurs, rubs, or gallops, mild nonpitting bilateral edema  Respiratory:  clear to auscultation bilaterally, normal work of breathing GI: soft, nontender, nondistended, + BS MS: no deformity or atrophy  Skin: warm and dry, no rash Neuro:  Alert and Oriented x 3, Strength and sensation are intact Psych: euthymic mood, full affect  Wt Readings from Last 3 Encounters:  08/29/16 294 lb (133.4 kg)  08/05/16 291 lb 0.1 oz (132 kg)  06/23/16 275 lb (124.7 kg)     Studies/Labs Reviewed:   EKG:  EKG is not ordered today.   Recent Labs: 08/04/2016: B Natriuretic Peptide 22.3; TSH 3.383 08/05/2016: BUN 20; Creatinine, Ser 1.21; Hemoglobin 11.3; Platelets 215; Potassium 3.8; Sodium 139   Lipid Panel    Component Value Date/Time   CHOL 166 09/30/2012 0929   TRIG 56.0 09/30/2012 0929   HDL 51.30 09/30/2012 0929   CHOLHDL 3 09/30/2012 0929   VLDL 11.2 09/30/2012 0929   LDLCALC 104 (H)  09/30/2012 0929    Additional studies/ records that were reviewed today include:   TTE: 03/2016 - Left ventricle: The cavity size was normal. There was mild   concentric hypertrophy. Systolic function was normal. The   estimated ejection fraction was in the range of 60% to 65%. Wall   motion was normal; there were no regional wall motion   abnormalities. Features are consistent with a pseudonormal left   ventricular filling pattern, with concomitant abnormal relaxation   and increased filling pressure (grade 2 diastolic dysfunction). - Pulmonic valve: Poorly visualized. There was trivial   regurgitation. - Pulmonary arteries: Systolic pressure could not be accurately   estimated.    ASSESSMENT:    1. Chronic diastolic heart failure   2. Hypertension, essential   3. Acute on chronic diastolic CHF (congestive heart failure) (HCC)   4. Flank pain      PLAN:  In order of problems listed above:  1. Acute on chronic diastolic CHF, the patient is 3 pounds up since her discharge but 10 pounds above her weight in December. We will increase her Lasix to 40 mg by mouth twice a  day. Her lungs are clear, its  difficult to assess her fluid status sec to her obesity, BNP at the last visit in the hospital was normal at 22. Check BMP, CBC, BNP. 2. Right flank pain - we will check U/A and urine culture.  3. Hypertension - well controlled   Medication Adjustments/Labs and Tests Ordered: Current medicines are reviewed at length with the patient today.  Concerns regarding medicines are outlined above.  Medication changes, Labs and Tests ordered today are listed in the Patient Instructions below. Patient Instructions  Medication Instructions:   INCREASE YOUR LASIX TO 40 MG TWICE DAILY      Labwork:  TODAY---BMET, PRO-BNP, CBC W DIFF, URINALYSIS, AND URINE CULTURE      Follow-Up:  2 WEEKS WITH AN EXTENDER ON DR Sueellen Kayes'S TEAM       If you need a refill on your cardiac  medications before your next appointment, please call your pharmacy.      Signed, Abigail Alexander, MD  08/29/2016 1:24 PM    [facility] [address] [place], [place], [place]  [address] Phone: [phone]; Fax: [phone]

## 2016-08-29 NOTE — Patient Instructions (Signed)
Medication Instructions:   INCREASE YOUR LASIX TO 40 MG TWICE DAILY      Labwork:  TODAY---BMET, PRO-BNP, CBC W DIFF, URINALYSIS, AND URINE CULTURE      Follow-Up:  2 WEEKS WITH AN EXTENDER ON DR NELSON'S TEAM       If you need a refill on your cardiac medications before your next appointment, please call your pharmacy.

## 2016-08-30 ENCOUNTER — Telehealth: Payer: Self-pay | Admitting: Cardiology

## 2016-08-30 LAB — URINE CULTURE

## 2016-08-30 NOTE — Telephone Encounter (Signed)
New Message ° ° pt verbalized that she is returning call for rn ° °For lab results  °

## 2016-08-30 NOTE — Telephone Encounter (Signed)
**Note De-identified  Obfuscation** The pt has been given her lab results and she verbalized understanding. 

## 2016-09-02 ENCOUNTER — Emergency Department (HOSPITAL_COMMUNITY): Payer: Medicare Other

## 2016-09-02 ENCOUNTER — Other Ambulatory Visit: Payer: Self-pay

## 2016-09-02 ENCOUNTER — Encounter (HOSPITAL_COMMUNITY): Payer: Self-pay

## 2016-09-02 DIAGNOSIS — I252 Old myocardial infarction: Secondary | ICD-10-CM | POA: Insufficient documentation

## 2016-09-02 DIAGNOSIS — R600 Localized edema: Secondary | ICD-10-CM | POA: Insufficient documentation

## 2016-09-02 DIAGNOSIS — Z91040 Latex allergy status: Secondary | ICD-10-CM | POA: Diagnosis not present

## 2016-09-02 DIAGNOSIS — I5033 Acute on chronic diastolic (congestive) heart failure: Secondary | ICD-10-CM | POA: Diagnosis not present

## 2016-09-02 DIAGNOSIS — K224 Dyskinesia of esophagus: Secondary | ICD-10-CM | POA: Diagnosis not present

## 2016-09-02 DIAGNOSIS — R0789 Other chest pain: Secondary | ICD-10-CM | POA: Diagnosis not present

## 2016-09-02 DIAGNOSIS — R079 Chest pain, unspecified: Secondary | ICD-10-CM | POA: Diagnosis not present

## 2016-09-02 DIAGNOSIS — E039 Hypothyroidism, unspecified: Secondary | ICD-10-CM | POA: Diagnosis not present

## 2016-09-02 DIAGNOSIS — I110 Hypertensive heart disease with heart failure: Secondary | ICD-10-CM | POA: Insufficient documentation

## 2016-09-02 DIAGNOSIS — J45909 Unspecified asthma, uncomplicated: Secondary | ICD-10-CM | POA: Diagnosis not present

## 2016-09-02 LAB — CBC
HCT: 36.5 % (ref 36.0–46.0)
Hemoglobin: 11.4 g/dL — ABNORMAL LOW (ref 12.0–15.0)
MCH: 26 pg (ref 26.0–34.0)
MCHC: 31.2 g/dL (ref 30.0–36.0)
MCV: 83.3 fL (ref 78.0–100.0)
Platelets: 210 10*3/uL (ref 150–400)
RBC: 4.38 MIL/uL (ref 3.87–5.11)
RDW: 15.7 % — ABNORMAL HIGH (ref 11.5–15.5)
WBC: 8.1 10*3/uL (ref 4.0–10.5)

## 2016-09-02 LAB — I-STAT TROPONIN, ED: Troponin i, poc: 0.01 ng/mL (ref 0.00–0.08)

## 2016-09-02 LAB — COMPREHENSIVE METABOLIC PANEL
ALT: 21 U/L (ref 14–54)
AST: 22 U/L (ref 15–41)
Albumin: 3.7 g/dL (ref 3.5–5.0)
Alkaline Phosphatase: 82 U/L (ref 38–126)
Anion gap: 8 (ref 5–15)
BUN: 16 mg/dL (ref 6–20)
CO2: 20 mmol/L — ABNORMAL LOW (ref 22–32)
Calcium: 8.8 mg/dL — ABNORMAL LOW (ref 8.9–10.3)
Chloride: 108 mmol/L (ref 101–111)
Creatinine, Ser: 0.95 mg/dL (ref 0.44–1.00)
GFR calc Af Amer: >60 mL/min (ref >60)
GFR calc non Af Amer: >60 mL/min (ref >60)
Glucose, Bld: 102 mg/dL — ABNORMAL HIGH (ref 65–99)
Potassium: 3.2 mmol/L — ABNORMAL LOW (ref 3.5–5.1)
Sodium: 136 mmol/L (ref 135–145)
Total Bilirubin: 0.5 mg/dL (ref 0.3–1.2)
Total Protein: 7 g/dL (ref 6.5–8.1)

## 2016-09-02 LAB — LIPASE, BLOOD: Lipase: 25 U/L (ref 11–51)

## 2016-09-02 MED ORDER — ONDANSETRON 4 MG PO TBDP
4.0000 mg | ORAL_TABLET | Freq: Once | ORAL | Status: AC
  Administered 2016-09-02: 4 mg via ORAL

## 2016-09-02 MED ORDER — ONDANSETRON 4 MG PO TBDP
ORAL_TABLET | ORAL | Status: AC
  Filled 2016-09-02: qty 1

## 2016-09-02 NOTE — ED Triage Notes (Signed)
Pt complaining of central chest pain and lower abdominal pain. Pt states N/V. States 4 episodes of emesis today. Pt states took 3 nitro PTA, no relief of pain. Pt denies any diarrhea or urinary symptoms.

## 2016-09-03 ENCOUNTER — Emergency Department (HOSPITAL_COMMUNITY)
Admission: EM | Admit: 2016-09-03 | Discharge: 2016-09-03 | Disposition: A | Discharge status: Home / Self Care | Payer: Medicare Other | Attending: Emergency Medicine | Admitting: Emergency Medicine

## 2016-09-03 DIAGNOSIS — R0789 Other chest pain: Secondary | ICD-10-CM

## 2016-09-03 DIAGNOSIS — K224 Dyskinesia of esophagus: Secondary | ICD-10-CM

## 2016-09-03 LAB — I-STAT TROPONIN, ED
Troponin i, poc: 0 ng/mL (ref 0.00–0.08)
Troponin i, poc: 0 ng/mL (ref 0.00–0.08)

## 2016-09-03 LAB — URINALYSIS, ROUTINE W REFLEX MICROSCOPIC
Bilirubin Urine: NEGATIVE
Glucose, UA: NEGATIVE mg/dL
Ketones, ur: NEGATIVE mg/dL
Nitrite: NEGATIVE
Protein, ur: NEGATIVE mg/dL
Specific Gravity, Urine: 1.023 (ref 1.005–1.030)
pH: 5 (ref 5.0–8.0)

## 2016-09-03 MED ORDER — GI COCKTAIL ~~LOC~~
30.0000 mL | Freq: Once | ORAL | Status: AC
  Administered 2016-09-03: 30 mL via ORAL
  Filled 2016-09-03: qty 30

## 2016-09-03 MED ORDER — ONDANSETRON HCL 4 MG/2ML IJ SOLN
4.0000 mg | Freq: Once | INTRAMUSCULAR | Status: AC
  Administered 2016-09-03: 4 mg via INTRAVENOUS

## 2016-09-03 MED ORDER — FUROSEMIDE 10 MG/ML IJ SOLN
40.0000 mg | Freq: Once | INTRAMUSCULAR | Status: AC
  Administered 2016-09-03: 40 mg via INTRAVENOUS
  Filled 2016-09-03: qty 4

## 2016-09-03 MED ORDER — ASPIRIN 81 MG PO CHEW
324.0000 mg | CHEWABLE_TABLET | Freq: Once | ORAL | Status: AC
  Administered 2016-09-03: 324 mg via ORAL
  Filled 2016-09-03: qty 4

## 2016-09-03 MED ORDER — NITROGLYCERIN 2 % TD OINT
1.0000 [in_us] | TOPICAL_OINTMENT | Freq: Once | TRANSDERMAL | Status: AC
  Administered 2016-09-03: 1 [in_us] via TOPICAL
  Filled 2016-09-03: qty 1

## 2016-09-03 MED ORDER — NITROGLYCERIN 0.4 MG SL SUBL: 0.4000 mg | SUBLINGUAL_TABLET | SUBLINGUAL | 0 refills | Status: DC | PRN

## 2016-09-03 MED ORDER — NITROGLYCERIN 0.4 MG SL SUBL
0.4000 mg | SUBLINGUAL_TABLET | SUBLINGUAL | Status: DC | PRN
  Administered 2016-09-03: 0.4 mg via SUBLINGUAL
  Filled 2016-09-03: qty 1

## 2016-09-03 MED ORDER — POTASSIUM CHLORIDE CRYS ER 20 MEQ PO TBCR
40.0000 meq | EXTENDED_RELEASE_TABLET | Freq: Once | ORAL | Status: AC
  Administered 2016-09-03: 40 meq via ORAL
  Filled 2016-09-03: qty 2

## 2016-09-03 NOTE — ED Notes (Signed)
ED Provider at bedside. 

## 2016-09-03 NOTE — Discharge Instructions (Signed)
Continue your medications. Take the Nitroglycerin as needed for your pain. Follow up with your doctors about your pain.

## 2016-09-03 NOTE — ED Provider Notes (Signed)
[facility] DEPT Provider Note   CSN: [id number] Arrival date & time: 09/02/16  2319   By signing my name below, I, [name], attest that this documentation has been prepared under the direction and in the presence of Rolland Porter, MD. Electronically Signed: Soijett Richardson, ED Scribe. 09/03/16. 1:12 AM.  Time seen 12:55 AM  History   Chief Complaint Chief Complaint  Patient presents with  . Chest Pain  . Abdominal Pain    HPI [name] is a 51 y.o. female with a PMHx of MI, chronic CP, HTN, esophageal spasms, who presents to the Emergency Department complaining of intermittent, sharp, central CP onset 5 AM yesterday, March 31. She notes that she was sleeping when the CP began and it woke her from her sleep. Pt reports that ambulation worsens her central CP. Pt last CP episode was 30-40 minutes ago and has resolved at this time, but that her current symptoms feel similar to when she had her MI. Pt reports associated non-bloody emesis x 7 episodes since 5 AM yesterday, nausea, abdominal soreness due to increased vomiting, resolved diaphoresis, SOB, and BLE swelling. Pt has tried 1 nitroglycerin tablet x 4 doses with her last dose being 2 hours ago, with temporary relief of her symptoms. Pt also tried Carvediol, Carafate, Prilosec, Mylanta, and prune juice with no relief of her symptoms. She notes that following treatment with NTG, her CP resolved within 10-15 minutes and lasted for several hours before recurring. She states that she was prescribed NTG due to MI in 2014 without stent placement and chronic angina. Pt Cardiologist is Dr. Meda Coffee with [facility]. Pt denies taking ASA PTA for the relief of her symptoms. She denies any other symptoms. Denies smoking cigarettes, drinks ETOH occasionally, and is unemployed on disability due to seizures. She states that she has a vagus nerve stimulator and has daily seizures consistent of staring, memory loss, jerking of the head, and  repetitive speech.   The history is provided by the patient. No language interpreter was used.   PCP Mackie Pai, MD Cardiology Dr Meda Coffee  Past Medical History:  Diagnosis Date  . Asthma   . Biliary dyskinesia    a. s/p cholecystectomy.  . 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 Abnl stress [place] 2008, f/u cath reportedly nl. b. ETT-Myoview 04/2011 - EKG changes but normal perfusion. Cor CT - no coronary calcium, no definite stenosis though mRCA not fully evaluated. c. 10/2011 - tn elevated in [place], LHC without CAD. Started on Ranexa, anti-anginals ?microvascular dz but later stopped while in hospital on abx.  . Complication of anesthesia    hard to wake up-had to be reminded to breath  . GERD (gastroesophageal reflux disease)    a. Severe.  Marland Kitchen HTN (hypertension)   . Hx of cardiovascular stress test    [facility] Myoview 8/14:  Normal, EF 74%  . Hx of echocardiogram    Echo 3/16:  Mild LVH, EF 55-60%, Gr 1 DD, trivial MR, mild LAE, normal RVF  . Hypothyroid   . MI (myocardial infarction)   . Migraine   . MRSA infection    a. After vagal nerve stimulator at [facility] - surgical site MRSA infection, PICC placed.  . Obesity   . Palpitations    a. 08/2014: 48 hour holter with 2 PVCs otherwise normal.  . Seizure disorder ([place])    a. since childhood. b. s/p vagal nerve stimulator at [facility].  . Seizures ([place])  Patient Active Problem List   Diagnosis Date Noted  . Acute on chronic diastolic CHF (congestive heart failure) ([place]) 08/29/2016  . Flank pain 08/29/2016  . Chest pain 08/04/2016  . Chronic pain syndrome 08/04/2016  . Midline low back pain without sciatica 04/19/2016  . Abnormal weight gain 06/11/2015  . BMI 40.0-44.9, adult ([place]) 06/11/2015  . Spells 01/03/2015  . Asthma 08/24/2014  . Chronic diastolic heart failure ([place]) 06/19/2014  . Allergic rhinitis 05/04/2014  . Severe persistent asthma 05/04/2014  . Pharyngoesophageal dysphagia 01/30/2014    . Chest pain syndrome 07/02/2013  . Hypothyroidism 07/02/2013  . Hypothyroid   . Chronic chest pain   . GERD (gastroesophageal reflux disease) 02/10/2013  . Biliary dyskinesia 12/30/2012  . Exertional dyspnea 09/19/2012  . Status post VNS (vagus nerve stimulator) placement 06/10/2012  . Refusal of blood transfusions as patient is Jehovah's Witness 05/20/2012  . Dyslipidemia 12/29/2011  . 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 Dyslipidemia, goal LDL below 70 05/17/2011  . Non-cardiac chest pain secondary to GERD 04/24/2011  . HTN, goal below 130/80 04/24/2011    Past Surgical History:  Procedure Laterality Date  . [place] N/A 06/01/2016   Procedure: Right Heart Cath;  Surgeon: Larey Dresser, MD;  Location: [facility];  Service: Cardiovascular;  Laterality: N/A;  . CESAREAN SECTION     placement of vagal nerve stimulator.  . CHOLECYSTECTOMY N/A 01/24/2013   Procedure: LAPAROSCOPIC CHOLECYSTECTOMY;  Surgeon: Harl Bowie, MD;  Location: [facility];  Service: General;  Laterality: N/A;  . IMPLANTATION VAGAL NERVE STIMULATOR  2000,2013   battery chg-[facility]    OB History    No data available       Home Medications    Prior to Admission medications   Medication Sig Start Date End Date Taking? Authorizing Provider  acetaminophen (TYLENOL) 325 MG tablet Take 975 mg by mouth every 6 (six) hours as needed for headache.   Yes Historical Provider, MD  albuterol (PROAIR HFA) 108 (90 BASE) MCG/ACT inhaler Inhale 2 puffs into the lungs every 4 (four) hours as needed for wheezing or shortness of breath.    Yes Historical Provider, MD  baclofen (LIORESAL) 10 MG tablet Take 10 mg by mouth 2 (two) times daily as needed for muscle spasms.  05/16/16  Yes Historical Provider, MD  carvedilol (COREG) 25 MG tablet Take 1 tablet (25 mg total) by mouth 2  (two) times daily with a meal. 08/05/16  Yes Verlee Monte, MD  DULoxetine (CYMBALTA) 30 MG capsule Take 1 capsule by mouth 2 (two) times daily.  05/15/16  Yes Historical Provider, MD  fluticasone (FLONASE) 50 MCG/ACT nasal spray Place 1 spray into both nostrils 2 (two) times daily as needed for allergies.  04/17/14  Yes Historical Provider, MD  furosemide (LASIX) 40 MG tablet Take 1 tablet (40 mg total) by mouth 2 (two) times daily. 08/29/16 11/27/16 Yes Dorothy Spark, MD  isosorbide mononitrate (IMDUR) 60 MG 24 hr tablet TAKE 1 TABLET (60 MG TOTAL) BY MOUTH 2 (TWO) TIMES DAILY. 11/11/14  Yes Larey Dresser, MD  levothyroxine (SYNTHROID, LEVOTHROID) 50 MCG tablet Take 50 mcg by mouth daily.    Yes Historical Provider, MD  losartan (COZAAR) 100 MG tablet Take 1 tablet (100 mg total) by mouth daily. 03/28/16  Yes Brittainy Erie Noe, PA-C  mometasone-formoterol (DULERA) 100-5 MCG/ACT AERO Inhale  2 puffs into the lungs 2 (two) times daily. 07/09/15  Yes Historical Provider, MD  montelukast (SINGULAIR) 10 MG tablet Take 10 mg by mouth daily as needed (for allergies).  04/17/14  Yes Historical Provider, MD  nitroGLYCERIN (NITRODUR - DOSED IN MG/24 HR) 0.2 mg/hr patch PLACE 1 PATCH ONTO THE SKIN DAILY. 09/08/14  Yes Larey Dresser, MD  pantoprazole (PROTONIX) 40 MG tablet Take 1 tablet (40 mg total) by mouth 2 (two) times daily. 08/05/16  Yes Verlee Monte, MD  promethazine (PHENERGAN) 25 MG tablet Take 25 mg by mouth every 4 (four) hours as needed for nausea or vomiting.    Yes Historical Provider, MD  sucralfate (CARAFATE) 1 g tablet Take 1 g by mouth 4 (four) times daily. 06/09/16  Yes Historical Provider, MD  topiramate (TOPAMAX) 100 MG tablet Take 200 mg by mouth 2 (two) times daily.   Yes Historical Provider, MD  traZODone (DESYREL) 50 MG tablet Take 50 mg by mouth at bedtime.   Yes Historical Provider, MD  venlafaxine (EFFEXOR) 75 MG tablet Take 75 mg by mouth 2 (two) times daily.   Yes Historical Provider,  MD  verapamil (CALAN) 120 MG tablet Take 120 mg by mouth 3 (three) times daily. 02/05/15  Yes Historical Provider, MD  nitroGLYCERIN (NITROSTAT) 0.4 MG SL tablet Place 1 tablet (0.4 mg total) under the tongue every 5 (five) minutes as needed for chest pain. 09/03/16   Rolland Porter, MD    Family History Family History  Problem Relation Age of Onset  . Coronary artery disease Mother 71  . Coronary artery disease Maternal Grandmother   . Cancer Other   . Hypertension Other   . Stroke Other     Social History Social History  Substance Use Topics  . Smoking status: Never Smoker  . Smokeless tobacco: Never Used  . Alcohol use 0.0 oz/week    1 - 3 Glasses of wine per week     Comment: occasional  on disability   Allergies   Amantadines; Amitriptyline; Amlodipine; Aspirin; Latex; Simvastatin; and Tramadol   Review of Systems Review of Systems  Constitutional: Positive for diaphoresis (resolved).  Respiratory: Positive for shortness of breath.   Cardiovascular: Positive for chest pain (central) and leg swelling (BLE).  Gastrointestinal: Positive for abdominal pain (soreness, due to vomiting), nausea and vomiting.  All other systems reviewed and are negative.    Physical Exam Updated Vital Signs BP (!) 147/103 (BP Location: Left Arm)   Pulse (!) 102   Temp 98.1 F (36.7 C) (Oral)   Resp (!) 24   SpO2 99%   Vital signs normal except diastolic hypertension, tachycardia   Physical Exam  Constitutional: She is oriented to person, place, and time. She appears well-developed and well-nourished.  Non-toxic appearance. She does not appear ill. No distress.  HENT:  Head: Normocephalic and atraumatic.  Right Ear: External ear normal.  Left Ear: External ear normal.  Nose: Nose normal. No mucosal edema or rhinorrhea.  Mouth/Throat: Oropharynx is clear and moist and mucous membranes are normal. No dental abscesses or uvula swelling.  Eyes: Conjunctivae and EOM are normal. Pupils are  equal, round, and reactive to light.  Neck: Normal range of motion and full passive range of motion without pain. Neck supple.  Cardiovascular: Normal rate, regular rhythm and normal heart sounds.  Exam reveals no gallop and no friction rub.   No murmur heard. Pulmonary/Chest: Effort normal. No respiratory distress. She has decreased breath sounds. She has no  wheezes. She has no rhonchi. She has no rales. She exhibits no tenderness and no crepitus.  Diminished breath sounds bilaterally.   Abdominal: Soft. Normal appearance and bowel sounds are normal. She exhibits no distension. There is no tenderness. There is no rebound and no guarding.  Musculoskeletal: Normal range of motion. She exhibits no tenderness.       Right lower leg: She exhibits edema.       Left lower leg: She exhibits edema.  Trace edema to BLE. Moves all extremities well.   Neurological: She is alert and oriented to person, place, and time. She has normal strength. No cranial nerve deficit.  Skin: Skin is warm, dry and intact. No rash noted. No erythema. No pallor.  Psychiatric: She has a normal mood and affect. Her speech is normal and behavior is normal. Her mood appears not anxious.  Nursing note and vitals reviewed.    ED Treatments / Results  Labs (all labs ordered are listed, but only abnormal results are displayed) Results for orders placed or performed during the hospital encounter of 09/03/16  CBC  Result Value Ref Range   WBC 8.1 4.0 - 10.5 K/uL   RBC 4.38 3.87 - 5.11 MIL/uL   Hemoglobin 11.4 (L) 12.0 - 15.0 g/dL   HCT 36.5 36.0 - 46.0 %   MCV 83.3 78.0 - 100.0 fL   MCH 26.0 26.0 - 34.0 pg   MCHC 31.2 30.0 - 36.0 g/dL   RDW 15.7 (H) 11.5 - 15.5 %   Platelets 210 150 - 400 K/uL  Lipase, blood  Result Value Ref Range   Lipase 25 11 - 51 U/L  Urinalysis, Routine w reflex microscopic  Result Value Ref Range   Color, Urine YELLOW YELLOW   APPearance HAZY (A) CLEAR   Specific Gravity, Urine 1.023 1.005 -  1.030   pH 5.0 5.0 - 8.0   Glucose, UA NEGATIVE NEGATIVE mg/dL   Hgb urine dipstick MODERATE (A) NEGATIVE   Bilirubin Urine NEGATIVE NEGATIVE   Ketones, ur NEGATIVE NEGATIVE mg/dL   Protein, ur NEGATIVE NEGATIVE mg/dL   Nitrite NEGATIVE NEGATIVE   Leukocytes, UA SMALL (A) NEGATIVE   RBC / HPF 0-5 0 - 5 RBC/hpf   WBC, UA 0-5 0 - 5 WBC/hpf   Bacteria, UA RARE (A) NONE SEEN   Squamous Epithelial / LPF 0-5 (A) NONE SEEN   Mucous PRESENT   Comprehensive metabolic panel  Result Value Ref Range   Sodium 136 135 - 145 mmol/L   Potassium 3.2 (L) 3.5 - 5.1 mmol/L   Chloride 108 101 - 111 mmol/L   CO2 20 (L) 22 - 32 mmol/L   Glucose, Bld 102 (H) 65 - 99 mg/dL   BUN 16 6 - 20 mg/dL   Creatinine, Ser 0.95 0.44 - 1.00 mg/dL   Calcium 8.8 (L) 8.9 - 10.3 mg/dL   Total Protein 7.0 6.5 - 8.1 g/dL   Albumin 3.7 3.5 - 5.0 g/dL   AST 22 15 - 41 U/L   ALT 21 14 - 54 U/L   Alkaline Phosphatase 82 38 - 126 U/L   Total Bilirubin 0.5 0.3 - 1.2 mg/dL   GFR calc non Af Amer >60 >60 mL/min   GFR calc Af Amer >60 >60 mL/min   Anion gap 8 5 - 15  I-stat troponin, ED  Result Value Ref Range   Troponin i, poc 0.01 0.00 - 0.08 ng/mL   Comment 3  I-stat troponin, ED  Result Value Ref Range   Troponin i, poc 0.00 0.00 - 0.08 ng/mL   Comment 3          I-stat troponin, ED  Result Value Ref Range   Troponin i, poc 0.00 0.00 - 0.08 ng/mL   Comment 3           Laboratory interpretation all normal except mild anemia, delta troponin is negative    EKG  EKG Interpretation  Date/Time:  Saturday September 02 2016 23:22:56 EDT Ventricular Rate:  91 PR Interval:  118 QRS Duration: 94 QT Interval:  352 QTC Calculation: 432 R Axis:   61 Text Interpretation:  Normal sinus rhythm Possible Left atrial enlargement Left ventricular hypertrophy with repolarization abnormality Cannot rule out Septal infarct , age undetermined No significant change since last tracing 03 Aug 2016 Confirmed by [facility]  MD-I, Tisha Cline  ([id number]) on 09/03/2016 1:08:19 AM       Radiology Dg Chest 2 View  Result Date: 09/03/2016 CLINICAL DATA:  Acute onset of substernal chest pain and shortness of breath. Initial encounter. EXAM: CHEST  2 VIEW COMPARISON:  Chest radiograph performed 08/03/2016 FINDINGS: The lungs are well-aerated. Mild vascular congestion is noted. There is no evidence of focal opacification, pleural effusion or pneumothorax. The heart is normal in size; the mediastinal contour is within normal limits. No acute osseous abnormalities are seen. A stimulator device is noted at the left chest wall. IMPRESSION: Mild vascular congestion.  Lungs remain grossly clear. Electronically Signed   By: Garald Balding M.D.   On: 09/03/2016 00:30    Procedures Procedures (including critical care time)  Medications Ordered in ED Medications  ondansetron (ZOFRAN-ODT) 4 MG disintegrating tablet (not administered)  nitroGLYCERIN (NITROSTAT) SL tablet 0.4 mg (0.4 mg Sublingual Given 09/03/16 0203)  ondansetron (ZOFRAN-ODT) disintegrating tablet 4 mg (4 mg Oral Given 09/02/16 2344)  nitroGLYCERIN (NITROGLYN) 2 % ointment 1 inch (1 inch Topical Given 09/03/16 0139)  aspirin chewable tablet 324 mg (324 mg Oral Given 09/03/16 0139)  potassium chloride SA (K-DUR,KLOR-CON) CR tablet 40 mEq (40 mEq Oral Given 09/03/16 0139)  furosemide (LASIX) injection 40 mg (40 mg Intravenous Given 09/03/16 0139)  gi cocktail (Maalox,Lidocaine,Donnatal) (30 mLs Oral Given 09/03/16 0409)  ondansetron (ZOFRAN) injection 4 mg (4 mg Intravenous Given 09/03/16 0430)     Initial Impression / Assessment and Plan / ED Course  I have reviewed the triage vital signs and the nursing notes.  Pertinent labs & imaging results that were available during my care of the patient were reviewed by me and considered in my medical decision making (see chart for details).   DIAGNOSTIC STUDIES: Oxygen Saturation is 99% on RA, nl by my interpretation.    COORDINATION OF CARE: 1:12 AM  Discussed the results of her labs, CXR and EKG. Her treatment plan includes ASA, NTG paste and a delta troponin. After reviewing her CXR she was given her usual dose of lasix 40 mg IV.   3:23 AM- Pt reassessed and discussed delta troponin was negative. Pt thinks that she is having esophageal spasms, was going to give procardia, but pt has severe allergy to amlodipine. Will treat with another medication.  Pt also can't have imipramine b/o an allergy to amitriptyline. She was given a GI cocktail.  Nurse reports she has had 1200 cc or urine output. Pt seems to get chest pain when she walks back from the bathroom.   Recheck at 06:00 AM pt states no more pain since  she got the GI cocktail.   NTG is an accepted treatment for esophageal spasms, will continue on that.   Review of doctors Nelson's note from March 27 shows patient has chronic chest pain and has negative cardiac catheterizations. Her last Cardiolite was in October 2017 which showed ejection fraction 49% with normal perfusion. Echocardiogram done in October 2017 showed normal ejection fraction of 60-65%.  Final Clinical Impressions(s) / ED Diagnoses   Final diagnoses:  Atypical chest pain  Esophageal spasm    New Prescriptions New Prescriptions   NITROGLYCERIN (NITROSTAT) 0.4 MG SL TABLET    Place 1 tablet (0.4 mg total) under the tongue every 5 (five) minutes as needed for chest pain.   Plan discharge  Rolland Porter, MD, FACEP    I personally performed the services described in this documentation, which was scribed in my presence. The recorded information has been reviewed and considered.  Rolland Porter, MD, Barbette Or, MD 09/03/16 0630

## 2016-09-06 ENCOUNTER — Ambulatory Visit: Payer: Medicare Other | Attending: Family Medicine | Admitting: Physical Therapy

## 2016-09-06 ENCOUNTER — Encounter: Payer: Self-pay | Admitting: Physical Therapy

## 2016-09-06 DIAGNOSIS — M62830 Muscle spasm of back: Secondary | ICD-10-CM

## 2016-09-06 DIAGNOSIS — M545 Low back pain, unspecified: Secondary | ICD-10-CM

## 2016-09-06 DIAGNOSIS — G8929 Other chronic pain: Secondary | ICD-10-CM | POA: Insufficient documentation

## 2016-09-06 DIAGNOSIS — R262 Difficulty in walking, not elsewhere classified: Secondary | ICD-10-CM

## 2016-09-06 NOTE — Therapy (Signed)
Patient Active Problem List   Diagnosis Date Noted  . Acute on chronic diastolic CHF (congestive heart failure) ([place]) 08/29/2016  . Flank pain 08/29/2016  . Chest pain 08/04/2016  . Chronic pain syndrome 08/04/2016  . Midline low back pain without sciatica 04/19/2016  . Abnormal weight gain 06/11/2015  . BMI 40.0-44.9, adult ([place]) 06/11/2015  . Spells 01/03/2015  . Asthma 08/24/2014  . Chronic diastolic heart failure ([place]) 06/19/2014  . Allergic rhinitis 05/04/2014  . Severe persistent asthma 05/04/2014  . Pharyngoesophageal dysphagia 01/30/2014  . Chest pain syndrome 07/02/2013  . Hypothyroidism  07/02/2013  . Hypothyroid   . Chronic chest pain   . GERD (gastroesophageal reflux disease) 02/10/2013  . Biliary dyskinesia 12/30/2012  . Exertional dyspnea 09/19/2012  . Status post VNS (vagus nerve stimulator) placement 06/10/2012  . Refusal of blood transfusions as patient is Jehovah's Witness 05/20/2012  . Dyslipidemia 12/29/2011  . 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 Dyslipidemia, goal LDL below 70 05/17/2011  . Non-cardiac chest pain secondary to GERD 04/24/2011  . HTN, goal below 130/80 04/24/2011    Carney Living PT [name]  09/06/2016, 4:28 PM  [facility] [address] [place], [place], [address] Phone: [phone]   Fax:  [phone]  Name: [name] MRN: [id number] Date of Birth: [birth year]  [place], [place], [address] Phone: [phone]   Fax:  [phone]  Physical Therapy Treatment/ Re certification   Patient Details  Name: [name] MRN: [id number] Date of Birth: 1965/09/30 Referring Provider: Dr Rodell Perna   Encounter Date: 09/06/2016      PT End of Session - 09/06/16 1344    Visit Number 15   Number of Visits 24   Date for PT Re-Evaluation 10/04/16   Authorization Type Medicare G-code done on visit 15 next 25    PT Start Time 1330   PT Stop Time 1423   PT Time Calculation (min) 53 min   Activity Tolerance Patient tolerated treatment well   Behavior During Therapy [facility] for tasks assessed/performed      Past Medical History:  Diagnosis Date  . Asthma   . Biliary dyskinesia    a. s/p cholecystectomy.  . 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 Abnl stress [place] 2008, f/u cath reportedly nl. b. ETT-Myoview 04/2011 - EKG changes but normal perfusion. Cor CT - no coronary calcium, no definite stenosis though mRCA not fully evaluated. c. 10/2011 - [place], LHC without CAD. Started on Ranexa, anti-anginals ?microvascular dz but later stopped while in hospital on abx.  . Complication of anesthesia    hard to wake up-had to be reminded to breath  . GERD (gastroesophageal reflux disease)    a. Severe.  Marland Kitchen HTN (hypertension)   . Hx of cardiovascular stress test    [place] Myoview 8/14:  Normal, EF 74%  . Hx of echocardiogram    Echo 3/16:  Mild LVH, EF 55-60%, Gr 1 DD, trivial MR, mild LAE, normal RVF  . Hypothyroid   . MI (myocardial infarction)   . Migraine   . MRSA infection    a. After vagal nerve stimulator at [facility] - surgical site MRSA infection, PICC placed.  . Obesity   . Palpitations    a. 08/2014: 48 hour holter with 2 PVCs otherwise normal.  . Seizure disorder ([place])    a. since childhood. b. s/p vagal nerve stimulator at [facility].  .  Seizures ([place])     Past Surgical History:  Procedure Laterality Date  . [place] N/A 06/01/2016   Procedure: Right Heart Cath;  Surgeon: Larey Dresser, MD;  Location: [place] [facility];  Service: Cardiovascular;  Laterality: N/A;  . CESAREAN SECTION     placement of vagal nerve stimulator.  . CHOLECYSTECTOMY N/A 01/24/2013   Procedure: LAPAROSCOPIC CHOLECYSTECTOMY;  Surgeon: Harl Bowie, MD;  Location: [place];  Service: General;  Laterality: N/A;  . IMPLANTATION VAGAL NERVE STIMULATOR  [phone]   [facility]    There were no vitals filed for this visit.      Subjective Assessment - 09/06/16 1339    Subjective Patient reports her back has been sore. Her daughter broke her ankle so she has been having to help take care of her daughter. She has not had time to do her exercises.    Limitations Standing;Walking   How long can you sit comfortably? > 1 hour    How long can you stand comfortably? < 10 min    How long can you walk comfortably? < 300' without increased pain    Diagnostic tests x-ray: no ankle fx    Patient Stated Goals  [place], [place], [address] Phone: [phone]   Fax:  [phone]  Physical Therapy Treatment/ Re certification   Patient Details  Name: [name] MRN: [id number] Date of Birth: 1965/09/30 Referring Provider: Dr Rodell Perna   Encounter Date: 09/06/2016      PT End of Session - 09/06/16 1344    Visit Number 15   Number of Visits 24   Date for PT Re-Evaluation 10/04/16   Authorization Type Medicare G-code done on visit 15 next 25    PT Start Time 1330   PT Stop Time 1423   PT Time Calculation (min) 53 min   Activity Tolerance Patient tolerated treatment well   Behavior During Therapy [facility] for tasks assessed/performed      Past Medical History:  Diagnosis Date  . Asthma   . Biliary dyskinesia    a. s/p cholecystectomy.  . 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 Abnl stress [place] 2008, f/u cath reportedly nl. b. ETT-Myoview 04/2011 - EKG changes but normal perfusion. Cor CT - no coronary calcium, no definite stenosis though mRCA not fully evaluated. c. 10/2011 - [place] in [place], LHC without CAD. Started on Ranexa, anti-anginals ?microvascular dz but later stopped while in hospital on abx.  . Complication of anesthesia    hard to wake up-had to be reminded to breath  . GERD (gastroesophageal reflux disease)    a. Severe.  [place] Kitchen HTN (hypertension)   . Hx of cardiovascular stress test    [place] Myoview 8/14:  Normal, EF 74%  . Hx of echocardiogram    Echo 3/16:  Mild LVH, EF 55-60%, Gr 1 DD, trivial MR, mild LAE, normal RVF  . Hypothyroid   . MI (myocardial infarction)   . Migraine   . MRSA infection    a. After vagal nerve stimulator at [facility] - surgical site MRSA infection, PICC placed.  . Obesity   . Palpitations    a. 08/2014: 48 hour holter with 2 PVCs otherwise normal.  . Seizure disorder ([place])    a. since childhood. b. s/p vagal nerve stimulator at [facility].  .  Seizures ([place])     Past Surgical History:  Procedure Laterality Date  . [place] N/A 06/01/2016   Procedure: Right Heart Cath;  Surgeon: Larey Dresser, MD;  Location: [place] [facility];  Service: Cardiovascular;  Laterality: N/A;  . CESAREAN SECTION     placement of vagal nerve stimulator.  . CHOLECYSTECTOMY N/A 01/24/2013   Procedure: LAPAROSCOPIC CHOLECYSTECTOMY;  Surgeon: Harl Bowie, MD;  Location: [place];  Service: General;  Laterality: N/A;  . IMPLANTATION VAGAL NERVE STIMULATOR  [phone]   [facility]    There were no vitals filed for this visit.      Subjective Assessment - 09/06/16 1339    Subjective Patient reports her back has been sore. Her daughter broke her ankle so she has been having to help take care of her daughter. She has not had time to do her exercises.    Limitations Standing;Walking   How long can you sit comfortably? > 1 hour    How long can you stand comfortably? < 10 min    How long can you walk comfortably? < 300' without increased pain    Diagnostic tests x-ray: no ankle fx    Patient Stated Goals  Patient Active Problem List   Diagnosis Date Noted  . Acute on chronic diastolic CHF (congestive heart failure) ([place]) 08/29/2016  . Flank pain 08/29/2016  . Chest pain 08/04/2016  . Chronic pain syndrome 08/04/2016  . Midline low back pain without sciatica 04/19/2016  . Abnormal weight gain 06/11/2015  . BMI 40.0-44.9, adult ([place]) 06/11/2015  . Spells 01/03/2015  . Asthma 08/24/2014  . Chronic diastolic heart failure ([place]) 06/19/2014  . Allergic rhinitis 05/04/2014  . Severe persistent asthma 05/04/2014  . Pharyngoesophageal dysphagia 01/30/2014  . Chest pain syndrome 07/02/2013  . Hypothyroidism  07/02/2013  . Hypothyroid   . Chronic chest pain   . GERD (gastroesophageal reflux disease) 02/10/2013  . Biliary dyskinesia 12/30/2012  . Exertional dyspnea 09/19/2012  . Status post VNS (vagus nerve stimulator) placement 06/10/2012  . Refusal of blood transfusions as patient is Jehovah's Witness 05/20/2012  . Dyslipidemia 12/29/2011  . 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 Dyslipidemia, goal LDL below 70 05/17/2011  . Non-cardiac chest pain secondary to GERD 04/24/2011  . HTN, goal below 130/80 04/24/2011    Carney Living PT [name]  09/06/2016, 4:28 PM  [facility] [address] [place], [place], [address] Phone: [phone]   Fax:  [phone]  Name: [name] MRN: [id number] Date of Birth: [birth year]

## 2016-09-08 ENCOUNTER — Ambulatory Visit: Payer: Medicare Other | Admitting: Physical Therapy

## 2016-09-08 ENCOUNTER — Encounter: Payer: Self-pay | Admitting: Physical Therapy

## 2016-09-08 DIAGNOSIS — M545 Low back pain, unspecified: Secondary | ICD-10-CM

## 2016-09-08 DIAGNOSIS — M62830 Muscle spasm of back: Secondary | ICD-10-CM

## 2016-09-08 DIAGNOSIS — R262 Difficulty in walking, not elsewhere classified: Secondary | ICD-10-CM | POA: Diagnosis not present

## 2016-09-08 DIAGNOSIS — G8929 Other chronic pain: Secondary | ICD-10-CM

## 2016-09-08 NOTE — Therapy (Signed)
[place], [place], [address] Phone: [phone]   Fax:  [phone]  Physical Therapy Treatment  Patient Details  Name: [name] MRN: [id number] Date of Birth: 1965/12/05 Referring Provider: Dr Rodell Perna   Encounter Date: 09/08/2016      PT End of Session - 09/08/16 1112    Visit Number 16   Number of Visits 24   Date for PT Re-Evaluation 10/04/16   Authorization Type Medicare G-code done on visit 15 next 25    PT Start Time 1107   PT Stop Time 1149   PT Time Calculation (min) 42 min   Activity Tolerance Patient tolerated treatment well   Behavior During Therapy [facility] for tasks assessed/performed      Past Medical History:  Diagnosis Date  . Asthma   . Biliary dyskinesia    a. s/p cholecystectomy.  . 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 Abnl stress [place] 2008, f/u cath reportedly nl. b. ETT-Myoview 04/2011 - EKG changes but normal perfusion. Cor CT - no coronary calcium, no definite stenosis though mRCA not fully evaluated. c. 10/2011 - [place], LHC without CAD. Started on Ranexa, anti-anginals ?microvascular dz but later stopped while in hospital on abx.  . Complication of anesthesia    hard to wake up-had to be reminded to breath  . GERD (gastroesophageal reflux disease)    a. Severe.  Mar[place] Kitchen HTN (hypertension)   . Hx of cardiovascular stress test    Lex Myoview 8/14:  Normal, EF 74%  . Hx of echocardiogram    Echo 3/16:  Mild LVH, EF 55-60%, Gr 1 DD, trivial MR, mild LAE, normal RVF  . Hypothyroid   . MI (myocardial infarction)   . Migraine   . MRSA infection    a. After vagal nerve stimulator at [facility] - surgical site MRSA infection, PICC placed.  . Obesity   . Palpitations    a. 08/2014: 48 hour holter with 2 PVCs otherwise normal.  . Seizure disorder ([place])    a. since childhood. b. s/p vagal nerve stimulator at [facility].  . Seizures ([place])      Past Surgical History:  Procedure Laterality Date  . [place] N/A 06/01/2016   Procedure: Right Heart Cath;  Surgeon: Larey Dresser, MD;  Location: [place] [facility];  Service: Cardiovascular;  Laterality: N/A;  . CESAREAN SECTION     placement of vagal nerve stimulator.  . CHOLECYSTECTOMY N/A 01/24/2013   Procedure: LAPAROSCOPIC CHOLECYSTECTOMY;  Surgeon: Harl Bowie, MD;  Location: [place];  Service: General;  Laterality: N/A;  . IMPLANTATION VAGAL NERVE STIMULATOR  [phone]   [facility]    There were no vitals filed for this visit.      Subjective Assessment - 09/08/16 1110    Limitations Standing;Walking   How long can you stand comfortably? < 10 min    How long can you walk comfortably? < 300' without increased pain    Diagnostic tests x-ray: no ankle fx    Patient Stated Goals to have less pain when perfroming activity    Currently in Pain? Yes   Pain Score 7    Pain Location Back   Pain Descriptors / Indicators Aching   Pain Type Chronic pain   Pain Onset More than a month ago   Pain Frequency Intermittent  Aggravating Factors  movement    Pain Relieving Factors rest    Effect of Pain on Daily Activities walking    Multiple Pain Sites Yes   Pain Score 3   Pain Location Ankle   Pain Orientation Right   Pain Descriptors / Indicators Aching   Pain Type Chronic pain   Pain Onset More than a month ago   Pain Frequency Constant   Aggravating Factors  standing and walking    Pain Relieving Factors rest   Effect of Pain on Daily Activities difficulty perfroming ADL's                          [facility] Adult PT Treatment/Exercise - 09/08/16 0001      Lumbar Exercises: Stretches   Single Knee to Chest Stretch Limitations 2x20sec with towel on the right    Lower Trunk Rotation Limitations x10 in low range    Piriformis Stretch Limitations with  towell 3x20 seconds each side      Lumbar Exercises: Supine   Clam Limitations red 2x10    Bent Knee Raise Limitations 2x10     Moist Heat Therapy   Number Minutes Moist Heat 10 Minutes   Moist Heat Location Lumbar Spine     Manual Therapy   Manual therapy comments  PA glide on the right hip; trigger point release to bilateral lumbar spine, Posterior glide on the right to improve right hip flexion; Ankle DF stretch; ankle anterior draw glide. long axis disraction to the right hip. PROM of the right ankle/ anteriro drawer glide.                  PT Education - 09/08/16 1112    Education provided Yes   Education Details continue with stretching and strengthening    Person(s) Educated Patient   Methods Demonstration;Explanation   Comprehension Verbalized understanding;Returned demonstration;Need further instruction          PT Short Term Goals - 09/08/16 1118      PT SHORT TERM GOAL #1   Title Patient will increase lumbar flexion and extension by 25%    Baseline flexion still limited 75% with pain 4/4   Time 4   Period Weeks   Status On-going     PT SHORT TERM GOAL #2   Title Patient will increase gross right ankle strength to 4/5 for ER/IR and DF    Baseline 3+/5 eversion all other movements WN:L    Time 4   Period Weeks   Status On-going     PT SHORT TERM GOAL #3   Title Patient will demsotrate a good core contraction    Baseline good core contraction noted with cuing   Time 4   Period Weeks   Status On-going     PT SHORT TERM GOAL #4   Title Patient will be independent with initial HEP    Baseline indepenent with intial HEP    Time 4   Period Weeks   Status Achieved           PT Long Term Goals - 09/06/16 1403      PT LONG TERM GOAL #1   Title Patient will stand for 1 hour without report of ankle and back pain    Baseline continued limitations in standing 4/4    Time 8   Period Weeks   Status On-going     PT LONG TERM GOAL #2   Title  Patient will  go up/down 8 steps without increased pain    Baseline continues to have pain with steps    Status On-going     PT LONG TERM GOAL #3   Title Patient will ambualte 3000' without increased pain in order to go shopping   Baseline limited household distances    Time 8   Period Weeks   Status On-going     PT LONG TERM GOAL #4   Title Patient will return to gym with a program to improve core and lower extremity strength    Time 8   Period Weeks   Status On-going               Plan - 09/08/16 1113    Clinical Impression Statement Patient was 7 minutes late for her appointment. She tolerated treatment well. She had less pain in her back with exercises. She has been using her stretches at home.    Rehab Potential Good   Clinical Impairments Affecting Rehab Potential long standing lower back pain    PT Frequency 2x / week   PT Duration 8 weeks   PT Treatment/Interventions ADLs/Self Care Home Management;Cryotherapy;Electrical Stimulation;Gait training;Stair training;Ultrasound;Traction;Iontophoresis 4mg /ml Dexamethasone;Moist Heat;Therapeutic activities;Therapeutic exercise;Neuromuscular re-education;Patient/family education;Manual techniques;Taping;Splinting;Energy conservation;Dry needling;Passive range of motion   PT Next Visit Plan consider maual therapy to increase hip motion, Continue with HEP; Consider light core strengthening clam shell, ball squeeze, Continue to perfrom manual DF ROM; add light resistance to ankle 4 way, patient is alergic to latex though. Continue to work on seated ankle exercises.     PT Home Exercise Plan LTR, abdominal breathing, single knee to chest stretch, ankle    Consulted and Agree with Plan of Care Patient      Patient will benefit from skilled therapeutic intervention in order to improve the following deficits and impairments:  Abnormal gait, Decreased strength, Decreased mobility, Pain, Increased muscle spasms, Decreased endurance,  Decreased safety awareness, Decreased activity tolerance  Visit Diagnosis: Chronic bilateral low back pain without sciatica  Muscle spasm of back  Difficulty in walking, not elsewhere classified     Problem List Patient Active Problem List   Diagnosis Date Noted  . Acute on chronic diastolic CHF (congestive heart failure) ([place]) 08/29/2016  . Flank pain 08/29/2016  . Chest pain 08/04/2016  . Chronic pain syndrome 08/04/2016  . Midline low back pain without sciatica 04/19/2016  . Abnormal weight gain 06/11/2015  . BMI 40.0-44.9, adult ([place]) 06/11/2015  . Spells 01/03/2015  . Asthma 08/24/2014  . Chronic diastolic heart failure ([place]) 06/19/2014  . Allergic rhinitis 05/04/2014  . Severe persistent asthma 05/04/2014  . Pharyngoesophageal dysphagia 01/30/2014  . Chest pain syndrome 07/02/2013  . Hypothyroidism 07/02/2013  . Hypothyroid   . Chronic chest pain   . GERD (gastroesophageal reflux disease) 02/10/2013  . Biliary dyskinesia 12/30/2012  . Exertional dyspnea 09/19/2012  . Status post VNS (vagus nerve stimulator) placement 06/10/2012  . Refusal of blood transfusions as patient is Jehovah's Witness 05/20/2012  . Dyslipidemia 12/29/2011  . 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 Dyslipidemia, goal LDL below 70 05/17/2011  . Non-cardiac chest pain secondary to GERD 04/24/2011  . HTN, goal below 130/80 04/24/2011    Carney Living 09/08/2016, 11:50 AM  [facility] [address] [place], [place], [address] Phone: [phone]   Fax:  [phone]  Name: [name] MRN: [id number] Date of Birth: 03-Jul-1965

## 2016-09-11 ENCOUNTER — Ambulatory Visit: Payer: Medicare Other | Admitting: Physical Therapy

## 2016-09-11 ENCOUNTER — Encounter: Payer: Self-pay | Admitting: Physical Therapy

## 2016-09-11 DIAGNOSIS — M545 Low back pain, unspecified: Secondary | ICD-10-CM

## 2016-09-11 DIAGNOSIS — R262 Difficulty in walking, not elsewhere classified: Secondary | ICD-10-CM

## 2016-09-11 DIAGNOSIS — M62830 Muscle spasm of back: Secondary | ICD-10-CM

## 2016-09-11 DIAGNOSIS — G8929 Other chronic pain: Secondary | ICD-10-CM

## 2016-09-12 ENCOUNTER — Encounter: Payer: Self-pay | Admitting: Physical Therapy

## 2016-09-12 NOTE — Therapy (Signed)
[place], [place], [address] Phone: [phone]   Fax:  [phone]  Physical Therapy Treatment  Patient Details  Name: PEONY BARNER MRN: [id number] Date of Birth: 1965-10-01 Referring Provider: Dr Rodell Perna   Encounter Date: 09/11/2016      PT End of Session - 09/11/16 1451    Visit Number 17   Number of Visits 24   Date for PT Re-Evaluation 10/04/16   Authorization Type Medicare G-code done on visit 15 next 25    PT Start Time 0220   PT Stop Time 0310   PT Time Calculation (min) 50 min   Activity Tolerance Patient tolerated treatment well   Behavior During Therapy [facility] for tasks assessed/performed      Past Medical History:  Diagnosis Date  . Asthma   . Biliary dyskinesia    a. s/p cholecystectomy.  . 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 Abnl stress [place] 2008, f/u cath reportedly nl. b. ETT-Myoview 04/2011 - EKG changes but normal perfusion. Cor CT - no coronary calcium, no definite stenosis though mRCA not fully evaluated. c. 10/2011 - [place], LHC without CAD. Started on Ranexa, anti-anginals ?microvascular dz but later stopped while in hospital on abx.  . Complication of anesthesia    hard to wake up-had to be reminded to breath  . GERD (gastroesophageal reflux disease)    a. Severe.  Marland Kitchen HTN (hypertension)   . Hx of cardiovascular stress test    Lex Myoview 8/14:  Normal, EF 74%  . Hx of echocardiogram    Echo 3/16:  Mild LVH, EF 55-60%, Gr 1 DD, trivial MR, mild LAE, normal RVF  . Hypothyroid   . MI (myocardial infarction)   . Migraine   . MRSA infection    a. After vagal nerve stimulator at [facility] - surgical site MRSA infection, PICC placed.  . Obesity   . Palpitations    a. 08/2014: 48 hour holter with 2 PVCs otherwise normal.  . Seizure disorder ([place])    a. since childhood. b. s/p vagal nerve stimulator at [facility].  . Seizures ([place])      Past Surgical History:  Procedure Laterality Date  . [place] N/A 06/01/2016   Procedure: Right Heart Cath;  Surgeon: Larey Dresser, MD;  Location: [place] [facility];  Service: Cardiovascular;  Laterality: N/A;  . CESAREAN SECTION     placement of vagal nerve stimulator.  . CHOLECYSTECTOMY N/A 01/24/2013   Procedure: LAPAROSCOPIC CHOLECYSTECTOMY;  Surgeon: Harl Bowie, MD;  Location: [place];  Service: General;  Laterality: N/A;  . IMPLANTATION VAGAL NERVE STIMULATOR  [phone]   [facility]    There were no vitals filed for this visit.      Subjective Assessment - 09/11/16 1429    Subjective Patient reports her ankle pain is about a 5/10. She was having some back pain earlier but she was able to use her stretches to improve her pain. She continues to work on her exercises for her  abck and her ankle.    Limitations Standing;Walking   How long can you sit comfortably? > 1 hour    How long can you stand comfortably? < 10 min    How long can you walk comfortably? < 300' without increased pain    Diagnostic tests x-ray: no ankle fx  Patient Stated Goals to have less pain when perfroming activity    Currently in Pain? Yes   Pain Score 3    Pain Location Back   Pain Orientation Right;Left   Pain Descriptors / Indicators Aching   Pain Type Chronic pain   Pain Onset More than a month ago   Pain Frequency Intermittent   Aggravating Factors  movement    Pain Relieving Factors rest   Effect of Pain on Daily Activities walking.   Multiple Pain Sites Yes   Pain Score 5   Pain Location Ankle   Pain Orientation Right   Pain Descriptors / Indicators Aching   Pain Type Chronic pain   Pain Onset More than a month ago   Pain Frequency Constant   Aggravating Factors  standing and walking    Pain Relieving Factors rest    Effect of Pain on Daily Activities Difficulty perfroming  ADL's                          [facility] Adult PT Treatment/Exercise - 09/12/16 0001      Lumbar Exercises: Stretches   Single Knee to Chest Stretch Limitations 2x20sec with towel on the right    Lower Trunk Rotation Limitations x10 in low range    Piriformis Stretch Limitations with towell 3x20 seconds each side      Lumbar Exercises: Standing   Other Standing Lumbar Exercises hip abduction 2x10; hip extension 2x10; standing heel raise 2x10; mini sqauts 2x10 in low range      Lumbar Exercises: Supine   Clam Limitations red 2x10    Bent Knee Raise Limitations 2x10     Moist Heat Therapy   Number Minutes Moist Heat 10 Minutes   Moist Heat Location Lumbar Spine     Manual Therapy   Manual therapy comments  PA glide on the right hip; , Posterior glide on the right to improve right hip flexion; Ankle DF stretch; ankle anterior draw glide. long axis disraction to the right hip. PROM of the right ankle/ anteriro drawer glide.                  PT Education - 09/11/16 1438    Education provided Yes   Education Details continue with current exercises.    Person(s) Educated Patient   Methods Explanation;Demonstration   Comprehension Verbalized understanding;Returned demonstration;Need further instruction          PT Short Term Goals - 09/08/16 1118      PT SHORT TERM GOAL #1   Title Patient will increase lumbar flexion and extension by 25%    Baseline flexion still limited 75% with pain 4/4   Time 4   Period Weeks   Status On-going     PT SHORT TERM GOAL #2   Title Patient will increase gross right ankle strength to 4/5 for ER/IR and DF    Baseline 3+/5 eversion all other movements WN:L    Time 4   Period Weeks   Status On-going     PT SHORT TERM GOAL #3   Title Patient will demsotrate a good core contraction    Baseline good core contraction noted with cuing   Time 4   Period Weeks   Status On-going     PT SHORT TERM GOAL #4   Title Patient  will be independent with initial HEP    Baseline indepenent with intial HEP    Time 4   Period  Weeks   Status Achieved           PT Long Term Goals - 09/06/16 1403      PT LONG TERM GOAL #1   Title Patient will stand for 1 hour without report of ankle and back pain    Baseline continued limitations in standing 4/4    Time 8   Period Weeks   Status On-going     PT LONG TERM GOAL #2   Title Patient will go up/down 8 steps without increased pain    Baseline continues to have pain with steps    Status On-going     PT LONG TERM GOAL #3   Title Patient will ambualte 3000' without increased pain in order to go shopping   Baseline limited household distances    Time 8   Period Weeks   Status On-going     PT LONG TERM GOAL #4   Title Patient will return to gym with a program to improve core and lower extremity strength    Time 8   Period Weeks   Status On-going               Plan - 09/12/16 1447    Clinical Impression Statement Patient tolerated treatment well. Therapy reviewedexercises that she can do in the pool at the [facility]. She plans on joing the gym soon. She tolerated ther-ex well. Therapy updated HEP.   Rehab Potential Good   Clinical Impairments Affecting Rehab Potential long standing lower back pain    PT Frequency 2x / week   PT Duration 8 weeks   PT Treatment/Interventions ADLs/Self Care Home Management;Cryotherapy;Electrical Stimulation;Gait training;Stair training;Ultrasound;Traction;Iontophoresis 4mg /ml Dexamethasone;Moist Heat;Therapeutic activities;Therapeutic exercise;Neuromuscular re-education;Patient/family education;Manual techniques;Taping;Splinting;Energy conservation;Dry needling;Passive range of motion   PT Next Visit Plan consider maual therapy to increase hip motion, Continue with HEP; Consider light core strengthening clam shell, ball squeeze, Continue to perfrom manual DF ROM; add light resistance to ankle 4 way, patient is alergic to latex  though. Continue to work on seated ankle exercises.     PT Home Exercise Plan LTR, abdominal breathing, single knee to chest stretch, ankle    Consulted and Agree with Plan of Care Patient      Patient will benefit from skilled therapeutic intervention in order to improve the following deficits and impairments:  Abnormal gait, Decreased strength, Decreased mobility, Pain, Increased muscle spasms, Decreased endurance, Decreased safety awareness, Decreased activity tolerance  Visit Diagnosis: Chronic bilateral low back pain without sciatica  Muscle spasm of back  Difficulty in walking, not elsewhere classified     Problem List Patient Active Problem List   Diagnosis Date Noted  . Acute on chronic diastolic CHF (congestive heart failure) ([place]) 08/29/2016  . Flank pain 08/29/2016  . Chest pain 08/04/2016  . Chronic pain syndrome 08/04/2016  . Midline low back pain without sciatica 04/19/2016  . Abnormal weight gain 06/11/2015  . BMI 40.0-44.9, adult ([place]) 06/11/2015  . Spells 01/03/2015  . Asthma 08/24/2014  . Chronic diastolic heart failure ([place]) 06/19/2014  . Allergic rhinitis 05/04/2014  . Severe persistent asthma 05/04/2014  . Pharyngoesophageal dysphagia 01/30/2014  . Chest pain syndrome 07/02/2013  . Hypothyroidism 07/02/2013  . Hypothyroid   . Chronic chest pain   . GERD (gastroesophageal reflux disease) 02/10/2013  . Biliary dyskinesia 12/30/2012  . Exertional dyspnea 09/19/2012  . Status post VNS (vagus nerve stimulator) placement 06/10/2012  . Refusal of blood transfusions as patient is Jehovah's Witness 05/20/2012  . Dyslipidemia 12/29/2011  .  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 Dyslipidemia, goal LDL below 70 05/17/2011  . Non-cardiac chest pain secondary to GERD 04/24/2011  . HTN, goal below 130/80 04/24/2011    Carney Living PT [name]  09/12/2016, 2:54 PM  [facility] [address] [place], [place], [address] Phone: [phone]   Fax:  [phone]  Name: ISIDORA LAHAM MRN: [id number] Date of Birth: [birth year]

## 2016-09-13 ENCOUNTER — Ambulatory Visit: Payer: Medicare Other | Admitting: Physical Therapy

## 2016-09-15 ENCOUNTER — Encounter: Payer: Self-pay | Admitting: Physical Therapy

## 2016-09-15 ENCOUNTER — Ambulatory Visit: Payer: Medicare Other | Admitting: Physical Therapy

## 2016-09-15 DIAGNOSIS — G8929 Other chronic pain: Secondary | ICD-10-CM

## 2016-09-15 DIAGNOSIS — M545 Low back pain, unspecified: Secondary | ICD-10-CM

## 2016-09-15 DIAGNOSIS — R262 Difficulty in walking, not elsewhere classified: Secondary | ICD-10-CM

## 2016-09-15 DIAGNOSIS — M62830 Muscle spasm of back: Secondary | ICD-10-CM

## 2016-09-15 NOTE — Therapy (Signed)
[place], [place], [address] Phone: [phone]   Fax:  [phone]  Physical Therapy Treatment  Patient Details  Name: [name] MRN: [id number] Date of Birth: Oct 13, 1965 Referring Provider: Dr Rodell Perna   Encounter Date: 09/15/2016      PT End of Session - 09/15/16 0958    Visit Number 18   Number of Visits 24   Date for PT Re-Evaluation 10/04/16   Authorization Type Medicare G-code done on visit 15 next 25    PT Start Time 0934   PT Stop Time 1025   PT Time Calculation (min) 51 min   Activity Tolerance Patient tolerated treatment well   Behavior During Therapy [facility] for tasks assessed/performed      Past Medical History:  Diagnosis Date  . Asthma   . Biliary dyskinesia    a. s/p cholecystectomy.  . 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 Abnl stress [place] 2008, f/u cath reportedly nl. b. ETT-Myoview 04/2011 - EKG changes but normal perfusion. Cor CT - no coronary calcium, no definite stenosis though mRCA not fully evaluated. c. 10/2011 - [place], LHC without CAD. Started on Ranexa, anti-anginals ?microvascular dz but later stopped while in hospital on abx.  . Complication of anesthesia    hard to wake up-had to be reminded to breath  . GERD (gastroesophageal reflux disease)    a. Severe.  Marland Kitchen HTN (hypertension)   . Hx of cardiovascular stress test    Lex Myoview 8/14:  Normal, EF 74%  . Hx of echocardiogram    Echo 3/16:  Mild LVH, EF 55-60%, Gr 1 DD, trivial MR, mild LAE, normal RVF  . Hypothyroid   . MI (myocardial infarction)   . Migraine   . MRSA infection    a. After vagal nerve stimulator at [facility] - surgical site MRSA infection, PICC placed.  . Obesity   . Palpitations    a. 08/2014: 48 hour holter with 2 PVCs otherwise normal.  . Seizure disorder ([place])    a. since childhood. b. s/p vagal nerve stimulator at [facility].  . Seizures ([place])      Past Surgical History:  Procedure Laterality Date  . [place] N/A 06/01/2016   Procedure: Right Heart Cath;  Surgeon: Larey Dresser, MD;  Location: [place] [facility];  Service: Cardiovascular;  Laterality: N/A;  . CESAREAN SECTION     placement of vagal nerve stimulator.  . CHOLECYSTECTOMY N/A 01/24/2013   Procedure: LAPAROSCOPIC CHOLECYSTECTOMY;  Surgeon: Harl Bowie, MD;  Location: [place];  Service: General;  Laterality: N/A;  . IMPLANTATION VAGAL NERVE STIMULATOR  [phone]   [facility]    There were no vitals filed for this visit.      Subjective Assessment - 09/15/16 0937    Subjective Patient reports that she has had a pretty good week. She has been using her stretches to help her back pain,    Limitations Standing;Walking   How long can you sit comfortably? > 1 hour    How long can you stand comfortably? < 10 min    How long can you walk comfortably? < 300' without increased pain    Diagnostic tests x-ray: no ankle fx    Patient Stated Goals to have less pain when perfroming activity    Currently in Pain? Yes  Pain Score 3    Pain Location Back   Pain Orientation Right;Left   Pain Descriptors / Indicators Aching   Pain Type Chronic pain   Pain Onset More than a month ago   Pain Frequency Intermittent   Aggravating Factors  movement    Pain Relieving Factors rest   Effect of Pain on Daily Activities walking    Pain Score 4   Pain Location Ankle   Pain Orientation Right   Pain Descriptors / Indicators Aching   Pain Type Chronic pain   Pain Onset More than a month ago   Pain Frequency Constant   Aggravating Factors  standing and walking    Pain Relieving Factors rest    Effect of Pain on Daily Activities difficulty perfroming ADL's                          [facility] Adult PT Treatment/Exercise - 09/15/16 0001      Lumbar Exercises:  Stretches   Single Knee to Chest Stretch Limitations 2x20sec with towel on the right    Lower Trunk Rotation Limitations x10 in low range    Piriformis Stretch Limitations with towell 3x20 seconds each side      Lumbar Exercises: Standing   Other Standing Lumbar Exercises hip abduction 2x10; hip extension 2x10; standing heel raise 2x10; mini sqauts 2x10 in low range      Lumbar Exercises: Supine   Clam Limitations red 2x10    Bent Knee Raise Limitations 2x10     Moist Heat Therapy   Number Minutes Moist Heat 10 Minutes   Moist Heat Location Lumbar Spine     Manual Therapy   Manual therapy comments  PA glide on the right hip; , Posterior glide on the right to improve right hip flexion; Ankle DF stretch; ankle anterior draw glide. long axis disraction to the right hip. PROM of the right ankle/ anteriro drawer glide.                  PT Education - 09/15/16 [phone]    Education provided Yes   Education Details continue with HEP, use your stretches to decrease your pain.    Person(s) Educated Patient   Methods Explanation;Demonstration   Comprehension Verbalized understanding;Returned demonstration;Need further instruction          PT Short Term Goals - 09/08/16 1118      PT SHORT TERM GOAL #1   Title Patient will increase lumbar flexion and extension by 25%    Baseline flexion still limited 75% with pain 4/4   Time 4   Period Weeks   Status On-going     PT SHORT TERM GOAL #2   Title Patient will increase gross right ankle strength to 4/5 for ER/IR and DF    Baseline 3+/5 eversion all other movements WN:L    Time 4   Period Weeks   Status On-going     PT SHORT TERM GOAL #3   Title Patient will demsotrate a good core contraction    Baseline good core contraction noted with cuing   Time 4   Period Weeks   Status On-going     PT SHORT TERM GOAL #4   Title Patient will be independent with initial HEP    Baseline indepenent with intial HEP    Time 4   Period  Weeks   Status Achieved           PT Long Term  Goals - 09/06/16 1403      PT LONG TERM GOAL #1   Title Patient will stand for 1 hour without report of ankle and back pain    Baseline continued limitations in standing 4/4    Time 8   Period Weeks   Status On-going     PT LONG TERM GOAL #2   Title Patient will go up/down 8 steps without increased pain    Baseline continues to have pain with steps    Status On-going     PT LONG TERM GOAL #3   Title Patient will ambualte 3000' without increased pain in order to go shopping   Baseline limited household distances    Time 8   Period Weeks   Status On-going     PT LONG TERM GOAL #4   Title Patient will return to gym with a program to improve core and lower extremity strength    Time 8   Period Weeks   Status On-going               Plan - 09/15/16 1149    Clinical Impression Statement Patient continues to make progress her hip flexion is improving as well as her ankle dorsi flexion. Therapy will continue to progress the patient as tolerated.    Clinical Impairments Affecting Rehab Potential long standing lower back pain    PT Frequency 2x / week   PT Duration 8 weeks   PT Treatment/Interventions ADLs/Self Care Home Management;Cryotherapy;Electrical Stimulation;Gait training;Stair training;Ultrasound;Traction;Iontophoresis 4mg /ml Dexamethasone;Moist Heat;Therapeutic activities;Therapeutic exercise;Neuromuscular re-education;Patient/family education;Manual techniques;Taping;Splinting;Energy conservation;Dry needling;Passive range of motion   PT Next Visit Plan consider maual therapy to increase hip motion, Continue with HEP; Consider light core strengthening clam shell, ball squeeze, Continue to perfrom manual DF ROM; add light resistance to ankle 4 way, patient is alergic to latex though. Continue to work on seated ankle exercises.     PT Home Exercise Plan LTR, abdominal breathing, single knee to chest stretch, ankle     Consulted and Agree with Plan of Care Patient      Patient will benefit from skilled therapeutic intervention in order to improve the following deficits and impairments:  Abnormal gait, Decreased strength, Decreased mobility, Pain, Increased muscle spasms, Decreased endurance, Decreased safety awareness, Decreased activity tolerance  Visit Diagnosis: Chronic bilateral low back pain without sciatica  Muscle spasm of back  Difficulty in walking, not elsewhere classified     Problem List Patient Active Problem List   Diagnosis Date Noted  . Acute on chronic diastolic CHF (congestive heart failure) ([place]) 08/29/2016  . Flank pain 08/29/2016  . Chest pain 08/04/2016  . Chronic pain syndrome 08/04/2016  . Midline low back pain without sciatica 04/19/2016  . Abnormal weight gain 06/11/2015  . BMI 40.0-44.9, adult ([place]) 06/11/2015  . Spells 01/03/2015  . Asthma 08/24/2014  . Chronic diastolic heart failure ([place]) 06/19/2014  . Allergic rhinitis 05/04/2014  . Severe persistent asthma 05/04/2014  . Pharyngoesophageal dysphagia 01/30/2014  . Chest pain syndrome 07/02/2013  . Hypothyroidism 07/02/2013  . Hypothyroid   . Chronic chest pain   . GERD (gastroesophageal reflux disease) 02/10/2013  . Biliary dyskinesia 12/30/2012  . Exertional dyspnea 09/19/2012  . Status post VNS (vagus nerve stimulator) placement 06/10/2012  . Refusal of blood transfusions as patient is Jehovah's Witness 05/20/2012  . Dyslipidemia 12/29/2011  . 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 Dyslipidemia, goal LDL below 70 05/17/2011  . Non-cardiac chest pain secondary to  GERD 04/24/2011  . HTN, goal below 130/80 04/24/2011    Carney Living PT [name]  09/15/2016, 11:51 AM  [facility] [address] [place], [place], [address] Phone: [phone]   Fax:  [phone]  Name: CHIANTE PEDEN MRN: [id number] Date of  Birth: 06-16-1965

## 2016-09-18 ENCOUNTER — Ambulatory Visit: Payer: Medicare Other | Admitting: Physical Therapy

## 2016-09-20 ENCOUNTER — Ambulatory Visit (INDEPENDENT_AMBULATORY_CARE_PROVIDER_SITE_OTHER): Payer: Medicare Other | Admitting: Physician Assistant

## 2016-09-20 ENCOUNTER — Ambulatory Visit: Payer: Medicare Other | Admitting: Physical Therapy

## 2016-09-20 ENCOUNTER — Encounter: Payer: Self-pay | Admitting: Physician Assistant

## 2016-09-20 VITALS — BP 150/80 | HR 84 | Ht 65.0 in | Wt 289.8 lb

## 2016-09-20 DIAGNOSIS — I110 Hypertensive heart disease with heart failure: Secondary | ICD-10-CM | POA: Diagnosis not present

## 2016-09-20 DIAGNOSIS — I208 Other forms of angina pectoris: Secondary | ICD-10-CM | POA: Diagnosis not present

## 2016-09-20 DIAGNOSIS — I5032 Chronic diastolic (congestive) heart failure: Secondary | ICD-10-CM | POA: Diagnosis not present

## 2016-09-20 DIAGNOSIS — E876 Hypokalemia: Secondary | ICD-10-CM

## 2016-09-20 MED ORDER — SPIRONOLACTONE 25 MG PO TABS: 25.0000 mg | ORAL_TABLET | Freq: Every day | ORAL | 3 refills | Status: DC

## 2016-09-20 NOTE — Progress Notes (Signed)
Cardiology Office Note:    Date:  09/20/2016   ID:  [name], DOB 1965-07-21, MRN [id number]  PCP:  Abigail Pai, MD  Cardiologist:  [name] >> Dr. Ena Richardson   Electrophysiologist:  n/a GI: Dr. Collene Richardson [facility]) Pulmonology: Dr. Soledad Richardson [facility])  Referring MD: Abigail Pai, MD   Chief Complaint  Patient presents with  . Congestive Heart Failure    follow up    History of Present Illness:    [name] is a 51 y.o. female with a complex hx of chest pain and shortness of breath.  Her hx includes prior negative caths as well as migraines, asthma, seizure d/o s/p vagal nerve stimulator and HTN. She has had chest pain chronically over a number of years. She had an abnormal stress test in [place] in 2008. She was then sent for a cardiac cath, which was reportedly normal. We had her do an ETT-myoview in this office in 11/12. This showed ischemic-type ST depression on exercise ECG, but there was no evidence for ischemia on myoview images. EF was 66%. Given ongoing chest pain and ischemic ECG changes with normal perfusion images (equivocal study), she underwent a coronary CT angiogram. This was a difficult study because we were not able to get her HR as low as would be ideal. However, she had no coronary calcium and no definite stenosis was seen (though mid-RCA was not fully evaluated due to artifact). Dr. Aundra Richardson felt that this was a negative study. She continued to have chest tightness with walking long distances, climbing a flight of steps, and housework. In 5/13, she went to the ER in [place] with particularly bad exertional chest pain that lasted a prolonged time. Troponin was mildly elevated with normal CKMB. She had a left heart cath in the hospital in [place] which showed no angiographic CAD. Echo showed normal EF. 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 She stopped amlodipine due to ankle  swelling.  She is followed by [facility] for Asthma. Vagal nerve stimulator implantation in 8786 was complicated by MRSA infection.  Echocardiogram in 10/17 demonstrated normal LV function with moderate diastolic dysfunction. Myoview in 10/17 demonstrated no ischemia. Right heart catheterization in 12/17 demonstrated normal right and left heart filling pressures and no evidence of pulmonary hypertension. She was admitted in 3/18 with chest pain. Cardiac enzymes remained negative. No further ischemic evaluation was recommended.  She established with Dr. Meda Richardson in 3/18. There was some concern for volume excess. Her Lasix was adjusted.  She was seen again in the emergency room 09/02/16. She complained of chest pain. There was some vascular congestion on chest x-ray. She was given IV Lasix x 1. She was also hypokalemic and this was replaced. She had improvement in her symptoms with GI cocktail. As needed nitroglycerin was recommended for probable esophageal spasm.  She is here alone.  She tells me that the IV Lasix provided her more relief than the GI cocktail.  She has chronic exertional chest pain that is relieved with NTG.  Her breathing is improved since she went to the ED.  However, she continues to have dyspnea on exertion with mild to moderate activity.  She sleeps on 3 pillows and notes occasional PND.  She notes some hand and ankle edema today.  She denies syncope.   Prior CV studies:   The following studies were reviewed today:  [place] 06/01/16 RA mean 7 RV 35/8 PA  31/12, mean 20 PCWP mean 10 Oxygen saturations: PA 66% AO 98% Cardiac Output (Fick) 5.88  Cardiac Index (Fick) 2.59 Normal right and left heart filling pressures, no pulmonary hypertension.  Normal cardiac output.   Myoview 04/04/16 EF 49, no ischemia, low risk  Echo 04/04/16 Mild concentric LVH, EF 60-65, normal wall motion, grade 2 diastolic dysfunction  48 Hr Holter 08/2014 No arrhythmia; rare PVC/PACs  Echo 08/28/14 Mild  LVH, EF 55-60, Gr 1 DD, trivial MR, mild LAE  Myoview 02/17/14 [facility]) Lexiscan - No ischemia, EF 70%  Echo 09/30/12 Mild LVH. EF 55-65%. No RWMA, Gr 2 DD, trivial AI   Past Medical History:  Diagnosis Date  . Asthma   . Biliary dyskinesia    a. s/p cholecystectomy.  . 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 Abnl stress [place] 2008, f/u cath reportedly nl. b. ETT-Myoview 04/2011 - EKG changes but normal perfusion. Cor CT - no coronary calcium, no definite stenosis though mRCA not fully evaluated. c. 10/2011 - tn elevated in [place], LHC without CAD. Started on Ranexa, anti-anginals ?microvascular dz but later stopped while in hospital on abx.  . Complication of anesthesia    hard to wake up-had to be reminded to breath  . GERD (gastroesophageal reflux disease)    a. Severe.  Marland Kitchen HTN (hypertension)   . Hx of cardiovascular stress test    Lex Myoview 8/14:  Normal, EF 74%  . Hx of echocardiogram    Echo 3/16:  Mild LVH, EF 55-60%, Gr 1 DD, trivial MR, mild LAE, normal RVF  . Hypothyroid   . MI (myocardial infarction) ([place])   . Migraine   . MRSA infection    a. After vagal nerve stimulator at [facility] - surgical site MRSA infection, PICC placed.  . Obesity   . Palpitations    a. 08/2014: 48 hour holter with 2 PVCs otherwise normal.  . Seizure disorder ([place])    a. since childhood. b. s/p vagal nerve stimulator at [facility].  . Seizures ([place])   1. Chest pain: Multiple episodes over the years. She had an abnormal stress test in 2008 in [place] and it sounds like she went to [facility] for a cardiac catheterization which per her report was normal. Stress echo at [facility] in 2010 was submaximal but showed no evidence for ischemia. ETT-myoview (11/12): 7'15", EF 66%, significant ST depression on exercise ECG, apical thinning on myoview images but no evidence for ischemia. Coronary CT angiogram (11/12): calcium score 0, difficult study with gating artifact, mid RCA  nonevaluatable but no stenosis elsewhere in the coronaries. Echo (11/12): EF 55-60%, mild LV hypertrophy, mild MR. NSTEMI in [place] in 5/13 with elevated troponin but LHC showed normal coronaries. Echo (5/13) with EF 60-65%, normal valve, no LVH. 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 Echo 09/30/12: Mild LVH, EF 55-65%, and GR 2 DD, trivial AI. 2. Migraines: Uses triptan medications.  3. HTN  4. Seizure disorder: Has vagus nerve stimulator.  5. Hypothyroidism  6. Asthma since childhood  Past Surgical History:  Procedure Laterality Date  . [place] N/A 06/01/2016   Procedure: Right Heart Cath;  Surgeon: Larey Dresser, MD;  Location: [facility];  Service: Cardiovascular;  Laterality: N/A;  . CESAREAN SECTION     placement of vagal nerve stimulator.  . CHOLECYSTECTOMY N/A 01/24/2013   Procedure: LAPAROSCOPIC CHOLECYSTECTOMY;  Surgeon: Harl Bowie,  MD;  Location: [facility];  Service: General;  Laterality: N/A;  . IMPLANTATION VAGAL NERVE STIMULATOR  2000,2013   battery chg-[facility]    Current Medications: Current Meds  Medication Sig  . acetaminophen (TYLENOL) 325 MG tablet Take 975 mg by mouth every 6 (six) hours as needed for headache.  . albuterol (PROAIR HFA) 108 (90 BASE) MCG/ACT inhaler Inhale 2 puffs into the lungs every 4 (four) hours as needed for wheezing or shortness of breath.   . baclofen (LIORESAL) 10 MG tablet Take 10 mg by mouth 2 (two) times daily as needed for muscle spasms.   . carvedilol (COREG) 25 MG tablet Take 1 tablet (25 mg total) by mouth 2 (two) times daily with a meal.  . DULoxetine (CYMBALTA) 30 MG capsule Take 1 capsule by mouth 2 (two) times daily.   . fluticasone (FLONASE) 50 MCG/ACT nasal spray Place 1 spray into both nostrils 2 (two) times daily as needed for allergies.   . furosemide (LASIX) 40 MG tablet Take 1 tablet (40 mg total) by  mouth 2 (two) times daily.  . isosorbide mononitrate (IMDUR) 60 MG 24 hr tablet TAKE 1 TABLET (60 MG TOTAL) BY MOUTH 2 (TWO) TIMES DAILY.  Marland Kitchen levothyroxine (SYNTHROID, LEVOTHROID) 50 MCG tablet Take 50 mcg by mouth daily.   Marland Kitchen losartan (COZAAR) 100 MG tablet Take 1 tablet (100 mg total) by mouth daily.  . mometasone-formoterol (DULERA) 100-5 MCG/ACT AERO Inhale 2 puffs into the lungs 2 (two) times daily.  . montelukast (SINGULAIR) 10 MG tablet Take 10 mg by mouth daily as needed (for allergies).   . nitroGLYCERIN (NITRODUR - DOSED IN MG/24 HR) 0.2 mg/hr patch PLACE 1 PATCH ONTO THE SKIN DAILY.  . nitroGLYCERIN (NITROSTAT) 0.4 MG SL tablet Place 1 tablet (0.4 mg total) under the tongue every 5 (five) minutes as needed for chest pain.  . pantoprazole (PROTONIX) 40 MG tablet Take 1 tablet (40 mg total) by mouth 2 (two) times daily.  . promethazine (PHENERGAN) 25 MG tablet Take 25 mg by mouth every 4 (four) hours as needed for nausea or vomiting.   . sucralfate (CARAFATE) 1 g tablet Take 1 g by mouth 4 (four) times daily.  Marland Kitchen topiramate (TOPAMAX) 100 MG tablet Take 200 mg by mouth 2 (two) times daily.  . traZODone (DESYREL) 50 MG tablet Take 50 mg by mouth at bedtime.  Marland Kitchen venlafaxine (EFFEXOR) 75 MG tablet Take 75 mg by mouth 2 (two) times daily.  . verapamil (CALAN) 120 MG tablet Take 120 mg by mouth 3 (three) times daily.     Allergies:   Amantadines; Amitriptyline; Amlodipine; Aspirin; Latex; Simvastatin; and Tramadol   Social History   Social History  . Marital status: Single    Spouse name: N/A  . Number of children: 2  . Years of education: N/A   Social History Main Topics  . Smoking status: Never Smoker  . Smokeless tobacco: Never Used  . Alcohol use 0.0 oz/week    1 - 3 Glasses of wine per week     Comment: occasional  . Drug use: No  . Sexual activity: Yes    Birth control/ protection: None   Other Topics Concern  . None   Social History Narrative   Lives alone     Family  History  Problem Relation Age of Onset  . Coronary artery disease Mother 64  . Coronary artery disease Maternal Grandmother   . Cancer Other   . Hypertension Other   .  Stroke Other      ROS:   Please see the history of present illness.    Review of Systems  Constitution: Positive for diaphoresis.  Cardiovascular: Positive for chest pain, dyspnea on exertion and leg swelling.  Respiratory: Positive for shortness of breath.   Musculoskeletal: Positive for joint pain and joint swelling.  Gastrointestinal: Positive for nausea.  Neurological: Positive for dizziness and headaches.  Psychiatric/Behavioral: The patient is nervous/anxious.    All other systems reviewed and are negative.   EKGs/Labs/Other Test Reviewed:    EKG:  EKG is  ordered today.  The ekg ordered today demonstrates NSR, HR 84, normal axis, T-wave inversion 2, 3, aVF, V4-V6, QTc 439 ms, no change since prior tracings  Recent Labs: 08/04/2016: B Natriuretic Peptide 22.3; TSH 3.383 08/29/2016: NT-Pro BNP 21 09/02/2016: ALT 21; BUN 16; Creatinine, Ser 0.95; Hemoglobin 11.4; Platelets 210; Potassium 3.2; Sodium 136   Recent Lipid Panel    Component Value Date/Time   CHOL 166 09/30/2012 0929   TRIG 56.0 09/30/2012 0929   HDL 51.30 09/30/2012 0929   CHOLHDL 3 09/30/2012 0929   VLDL 11.2 09/30/2012 0929   LDLCALC 104 (H) 09/30/2012 0929     Physical Exam:    VS:  BP (!) 150/80   Pulse 84   Ht 5\' 5"  (1.651 m)   Wt 289 lb 12.8 oz (131.5 kg)   BMI 48.23 kg/m     Wt Readings from Last 3 Encounters:  09/20/16 289 lb 12.8 oz (131.5 kg)  08/29/16 294 lb (133.4 kg)  08/05/16 291 lb 0.1 oz (132 kg)     Physical Exam  Constitutional: She is oriented to person, place, and time. She appears well-developed and well-nourished. No distress.  HENT:  Head: Normocephalic and atraumatic.  Eyes: No scleral icterus.  Neck: Normal range of motion. No JVD (JVD is difficult to assess)) present.  Cardiovascular: Normal rate,  regular rhythm, S1 normal, S2 normal and normal heart sounds.   No murmur heard. Pulmonary/Chest: She has decreased breath sounds. She has no wheezes. She has no rhonchi. She has no rales.  Abdominal: Soft. There is no tenderness.  Musculoskeletal: She exhibits no edema.  Neurological: She is alert and oriented to person, place, and time.  Skin: Skin is warm and dry.  Acanthosis nigricans about the base of her neck  Psychiatric: She has a normal mood and affect.    ASSESSMENT:    1. Chronic diastolic heart failure ([place])   2. 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 Hypertensive heart disease with chronic diastolic congestive heart failure ([place])   4. Morbid obesity ([place])   5. Hypokalemia    PLAN:    In order of problems listed above:  1. Chronic diastolic heart failure ([place]) -  She has had very little objective data to confirm a dx of diastolic CHF in the past, but she clearly improves with diuresis.  She did have evidence of vascular congestion on her CXR when she went to the ED recently.  Overall, her volume seems stable. She probably has some more to lose.  Her BP is above target.  She did have a low K+ in the ED.  -  Continue current dose of Lasix  -  Add Spironolactone 25 mg QD  -  BMET today and repeat Q 1 week x 2  2. Microvascular angina (HCC) - Multiple prior cardiac catheterizations with no CAD.  She has exertional chest pain that is NTG responsive.    -  Continue isosorbide, nitroglycerin patch, verapamil, when necessary nitroglycerin  3. Hypertensive heart disease with chronic diastolic congestive heart failure (HCC) -  Blood pressure above target. Add spironolactone as noted.  4. Morbid obesity ([place]) - She continues to follow-up at [facility] with weight loss management. I have encouraged her to continue this as weight loss will definitely improve her shortness of breath and heart failure and reduce her risk for diabetes.  5. Hypokalemia -  Repeat BMET today. Start  Spironolactone as noted.   Dispo:  Return in about 2 months (around 11/20/2016) for Routine Follow Up with Dr. Meda Richardson.    Medication Adjustments/Labs and Tests Ordered: Current medicines are reviewed at length with the patient today.  Concerns regarding medicines are outlined above.  Medication changes, Labs and Tests are noted below.  Please see Patient Instructions for other items discussed this visit.  Orders Placed This Encounter  Procedures  . Basic Metabolic Panel (BMET)  . Basic Metabolic Panel (BMET)  . Basic Metabolic Panel (BMET)  . EKG 12-Lead   Meds ordered this encounter  Medications  . spironolactone (ALDACTONE) 25 MG tablet    Sig: Take 1 tablet (25 mg total) by mouth daily.    Dispense:  90 tablet    Refill:  3    Signed, Richardson Dopp, PA-C  09/20/2016 [facility] [address], [place], [place]  [address] Phone: [phone]; Fax: [phone]

## 2016-09-20 NOTE — Patient Instructions (Addendum)
Medication Instructions:  1. START SPIRONOLACTONE 25 MG 1 TABLET DAILY  Labwork: 1. TODAY BMET  2. IN 1 WEEK AFTER STARTING SPIRONOLACTONE YOU WILL NEED BMET 3. 2 WEEKS AFTER YOU STARTED THE SPIRONOLACTONE YOU WILL NEED ANOTHER BMET  Testing/Procedures: NONE ORDERED  Follow-Up: DR. Meda Coffee IN 2 MONTHS  Any Other Special Instructions Will Be Listed Below (If Applicable).  If you need a refill on your cardiac medications before your next appointment, please call your pharmacy.

## 2016-09-21 LAB — BASIC METABOLIC PANEL
BUN/Creatinine Ratio: 14 (ref 9–23)
BUN: 12 mg/dL (ref 6–24)
CO2: 24 mmol/L (ref 18–29)
Calcium: 9.3 mg/dL (ref 8.7–10.2)
Chloride: 106 mmol/L (ref 96–106)
Creatinine, Ser: 0.85 mg/dL (ref 0.57–1.00)
GFR calc Af Amer: 91 mL/min/{1.73_m2} (ref >59)
GFR calc non Af Amer: 79 mL/min/{1.73_m2} (ref >59)
Glucose: 94 mg/dL (ref 65–99)
Potassium: 4 mmol/L (ref 3.5–5.2)
Sodium: 143 mmol/L (ref 134–144)

## 2016-09-22 ENCOUNTER — Telehealth: Payer: Self-pay | Admitting: *Deleted
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
## 2016-09-25 ENCOUNTER — Other Ambulatory Visit: Payer: Self-pay | Admitting: *Deleted

## 2016-09-26 ENCOUNTER — Other Ambulatory Visit: Payer: Self-pay | Admitting: *Deleted

## 2016-09-26 ENCOUNTER — Encounter: Payer: Self-pay | Admitting: Physical Therapy

## 2016-09-26 ENCOUNTER — Ambulatory Visit: Payer: Medicare Other | Admitting: Physical Therapy

## 2016-09-26 DIAGNOSIS — J4551 Severe persistent asthma with (acute) exacerbation: Secondary | ICD-10-CM

## 2016-09-26 DIAGNOSIS — G8929 Other chronic pain: Secondary | ICD-10-CM

## 2016-09-26 DIAGNOSIS — M545 Low back pain, unspecified: Secondary | ICD-10-CM

## 2016-09-26 DIAGNOSIS — M62830 Muscle spasm of back: Secondary | ICD-10-CM

## 2016-09-26 DIAGNOSIS — R262 Difficulty in walking, not elsewhere classified: Secondary | ICD-10-CM

## 2016-09-26 DIAGNOSIS — R079 Chest pain, unspecified: Secondary | ICD-10-CM

## 2016-09-26 MED ORDER — MOMETASONE FURO-FORMOTEROL FUM 100-5 MCG/ACT IN AERO: 2.0000 | INHALATION_SPRAY | Freq: Two times a day (BID) | RESPIRATORY_TRACT | 2 refills | Status: DC

## 2016-09-26 MED ORDER — ALBUTEROL SULFATE HFA 108 (90 BASE) MCG/ACT IN AERS: 2.0000 | INHALATION_SPRAY | RESPIRATORY_TRACT | 2 refills | Status: DC | PRN

## 2016-09-26 MED ORDER — NITROGLYCERIN 0.2 MG/HR TD PT24: MEDICATED_PATCH | TRANSDERMAL | 1 refills | Status: DC
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
## 2016-09-27 ENCOUNTER — Other Ambulatory Visit: Payer: Medicare Other | Admitting: *Deleted

## 2016-09-27 DIAGNOSIS — I5032 Chronic diastolic (congestive) heart failure: Secondary | ICD-10-CM

## 2016-09-27 DIAGNOSIS — I110 Hypertensive heart disease with heart failure: Secondary | ICD-10-CM | POA: Diagnosis not present

## 2016-09-27 DIAGNOSIS — E876 Hypokalemia: Secondary | ICD-10-CM

## 2016-09-28 LAB — BASIC METABOLIC PANEL
BUN/Creatinine Ratio: 18 (ref 9–23)
BUN: 16 mg/dL (ref 6–24)
CO2: 22 mmol/L (ref 18–29)
Calcium: 9.1 mg/dL (ref 8.7–10.2)
Chloride: 105 mmol/L (ref 96–106)
Creatinine, Ser: 0.9 mg/dL (ref 0.57–1.00)
GFR calc Af Amer: 85 mL/min/{1.73_m2} (ref >59)
GFR calc non Af Amer: 74 mL/min/{1.73_m2} (ref >59)
Glucose: 97 mg/dL (ref 65–99)
Potassium: 3.9 mmol/L (ref 3.5–5.2)
Sodium: 142 mmol/L (ref 134–144)

## 2016-09-29 ENCOUNTER — Telehealth: Payer: Self-pay

## 2016-09-29 ENCOUNTER — Other Ambulatory Visit: Payer: Self-pay | Admitting: *Deleted

## 2016-09-29 DIAGNOSIS — J4551 Severe persistent asthma with (acute) exacerbation: Secondary | ICD-10-CM

## 2016-09-29 MED ORDER — ALBUTEROL SULFATE HFA 108 (90 BASE) MCG/ACT IN AERS: 2.0000 | INHALATION_SPRAY | Freq: Four times a day (QID) | RESPIRATORY_TRACT | 6 refills | Status: AC | PRN

## 2016-09-29 NOTE — Telephone Encounter (Signed)
RN received a faxed notification from the pharmacy requested a switch from ProAir to Proventil. Dr Meda Coffee approved this switch.

## 2016-09-29 NOTE — Telephone Encounter (Addendum)
Prior auth for Proventil MDI sent to [facility] via   Cover My Meds.  Proventil HFA approved by [facility]. Local pharmacy notified.

## 2016-10-04 ENCOUNTER — Ambulatory Visit: Payer: Medicare Other | Attending: Family Medicine | Admitting: Physical Therapy

## 2016-10-04 ENCOUNTER — Other Ambulatory Visit: Payer: Medicare Other

## 2016-10-04 DIAGNOSIS — G8929 Other chronic pain: Secondary | ICD-10-CM | POA: Insufficient documentation

## 2016-10-04 DIAGNOSIS — M62830 Muscle spasm of back: Secondary | ICD-10-CM | POA: Insufficient documentation

## 2016-10-04 DIAGNOSIS — M545 Low back pain: Secondary | ICD-10-CM | POA: Insufficient documentation

## 2016-10-04 DIAGNOSIS — R262 Difficulty in walking, not elsewhere classified: Secondary | ICD-10-CM | POA: Insufficient documentation

## 2016-10-05 ENCOUNTER — Other Ambulatory Visit: Payer: Medicare Other | Admitting: *Deleted

## 2016-10-05 DIAGNOSIS — I110 Hypertensive heart disease with heart failure: Secondary | ICD-10-CM

## 2016-10-05 DIAGNOSIS — E876 Hypokalemia: Secondary | ICD-10-CM

## 2016-10-05 DIAGNOSIS — I5032 Chronic diastolic (congestive) heart failure: Secondary | ICD-10-CM | POA: Diagnosis not present

## 2016-10-06 ENCOUNTER — Telehealth: Payer: Self-pay | Admitting: *Deleted

## 2016-10-06 ENCOUNTER — Ambulatory Visit: Payer: Medicare Other | Admitting: Physical Therapy

## 2016-10-06 LAB — BASIC METABOLIC PANEL
BUN/Creatinine Ratio: 17 (ref 9–23)
BUN: 14 mg/dL (ref 6–24)
CO2: 22 mmol/L (ref 18–29)
Calcium: 9 mg/dL (ref 8.7–10.2)
Chloride: 104 mmol/L (ref 96–106)
Creatinine, Ser: 0.82 mg/dL (ref 0.57–1.00)
GFR calc Af Amer: 95 mL/min/{1.73_m2} (ref >59)
GFR calc non Af Amer: 83 mL/min/{1.73_m2} (ref >59)
Glucose: 92 mg/dL (ref 65–99)
Potassium: 4.1 mmol/L (ref 3.5–5.2)
Sodium: 140 mmol/L (ref 134–144)

## 2016-10-06 NOTE — Telephone Encounter (Signed)
-----   Message from Liliane Shi, [place] sent at 10/06/2016  1:40 PM EDT ----- Please call patient: The kidney function (BUN, Creatinine) and potassium are normal. All other parameters are within acceptable limits and no further intervention or testing required. Continue with current treatment plan. Richardson Dopp, PA-C    10/06/2016 1:40 PM

## 2016-10-06 NOTE — Telephone Encounter (Signed)
Pt notified of normal lab results by phone with verbal understanding. Pt thanked me for my call today.

## 2016-10-11 ENCOUNTER — Ambulatory Visit: Payer: Medicare Other | Admitting: Physical Therapy

## 2016-10-13 ENCOUNTER — Encounter: Payer: Self-pay | Admitting: Physical Therapy

## 2016-10-13 ENCOUNTER — Ambulatory Visit: Payer: Medicare Other | Admitting: Physical Therapy

## 2016-10-13 DIAGNOSIS — R262 Difficulty in walking, not elsewhere classified: Secondary | ICD-10-CM | POA: Diagnosis present

## 2016-10-13 DIAGNOSIS — G8929 Other chronic pain: Secondary | ICD-10-CM

## 2016-10-13 DIAGNOSIS — M62830 Muscle spasm of back: Secondary | ICD-10-CM | POA: Diagnosis present

## 2016-10-13 DIAGNOSIS — M545 Low back pain, unspecified: Secondary | ICD-10-CM

## 2016-10-13 NOTE — Therapy (Signed)
[place], [place], [address] Phone: [phone]   Fax:  [phone]  Physical Therapy Discharge  Patient Details  Name: [name] MRN: [id number] Date of Birth: 10-Nov-1965 Referring Provider: Dr Rodell Perna   Encounter Date: 10/13/2016      PT End of Session - 10/13/16 0815    Visit Number 20   Number of Visits 24   Date for PT Re-Evaluation 10/04/16   Authorization Type Medicare G-code done on visit 15 next 25    PT Start Time 0806   PT Stop Time 0845   PT Time Calculation (min) 39 min   Activity Tolerance Patient tolerated treatment well   Behavior During Therapy [facility] for tasks assessed/performed      Past Medical History:  Diagnosis Date  . Asthma   . Biliary dyskinesia    a. s/p cholecystectomy.  . 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 Abnl stress [place] 2008, f/u cath reportedly nl. b. ETT-Myoview 04/2011 - EKG changes but normal perfusion. Cor CT - no coronary calcium, no definite stenosis though mRCA not fully evaluated. c. 10/2011 - [place], LHC without CAD. Started on Ranexa, anti-anginals ?microvascular dz but later stopped while in hospital on abx.  . Complication of anesthesia    hard to wake up-had to be reminded to breath  . GERD (gastroesophageal reflux disease)    a. Severe.  Marland Kitchen HTN (hypertension)   . Hx of cardiovascular stress test    [place] Myoview 8/14:  Normal, EF 74%  . Hx of echocardiogram    Echo 3/16:  Mild LVH, EF 55-60%, Gr 1 DD, trivial MR, mild LAE, normal RVF  . Hypothyroid   . MI (myocardial infarction) ([place])   . Migraine   . MRSA infection    a. After vagal nerve stimulator at [facility] - surgical site MRSA infection, PICC placed.  . Obesity   . Palpitations    a. 08/2014: 48 hour holter with 2 PVCs otherwise normal.  . Seizure disorder ([place])    a. since childhood. b. s/p vagal nerve stimulator at [facility].  . Seizures ([place])      Past Surgical History:  Procedure Laterality Date  . [place] N/A 06/01/2016   Procedure: Right Heart Cath;  Surgeon: Larey Dresser, MD;  Location: [place] [facility];  Service: Cardiovascular;  Laterality: N/A;  . CESAREAN SECTION     placement of vagal nerve stimulator.  . CHOLECYSTECTOMY N/A 01/24/2013   Procedure: LAPAROSCOPIC CHOLECYSTECTOMY;  Surgeon: Harl Bowie, MD;  Location: [place];  Service: General;  Laterality: N/A;  . IMPLANTATION VAGAL NERVE STIMULATOR  [phone]   [facility]    There were no vitals filed for this visit.       Subjective Assessment - 10/13/16 0810    Subjective Patient reports she has began to go to the gym and to walk outside. She continues to have ankle pain. She also has lower back pain but it is much better.    Limitations Standing;Walking   How long can you sit comfortably? > 1 hour    How long can you stand comfortably? < 10 min    How long can you walk comfortably? < 300' without increased pain    Diagnostic tests x-ray: no ankle fx    Patient Stated Goals to have less  pain when perfroming activity    Currently in Pain? Yes   Pain Score 4    Pain Location Back   Pain Orientation Right;Left   Pain Descriptors / Indicators Aching   Pain Type Chronic pain   Pain Onset More than a month ago   Pain Frequency Intermittent   Aggravating Factors  movement    Pain Relieving Factors rest,    Effect of Pain on Daily Activities walking    Pain Score 7   Pain Location Ankle   Pain Orientation Right   Pain Descriptors / Indicators Aching   Pain Type Chronic pain   Pain Onset More than a month ago   Pain Frequency Constant   Aggravating Factors  standing and walking    Pain Relieving Factors rest    Effect of Pain on Daily Activities difficulty perfroming ADL's             [facility] PT Assessment - 10/13/16 0001      AROM    Lumbar Flexion 25% limited    Lumbar Extension 50 % limited with pain    Lumbar - Right Side Bend no limit    Lumbar - Left Side Bend 25% with pain    Lumbar - Right Rotation 25% with pain    Lumbar - Left Rotation 25% with pain      PROM   Right Ankle Dorsiflexion 0   Right Ankle Plantar Flexion 40     Strength   Right Hip Flexion 4-/5   Right Hip Extension 4-/5   Right Hip ABduction 4/5   Left Hip ABduction 4/5   Left Hip ADduction 4/5   Right Ankle Dorsiflexion 4+/5   Right Ankle Plantar Flexion 2/5   Right Ankle Inversion 3/5     Palpation   Palpation comment Tenderness to palpation in the upper right ankle and into the lateral maleolus; ; Spasming of the lower back L > R                    [facility] Adult PT Treatment/Exercise - 10/13/16 0001      Lumbar Exercises: Stretches   Single Knee to Chest Stretch Limitations 2x20sec with towel on the right    Lower Trunk Rotation Limitations x10 in low range    Piriformis Stretch Limitations with towell 3x20 seconds each side      Lumbar Exercises: Standing   Other Standing Lumbar Exercises hip abduction 10; hip extension 10; standing heel raise 2x10; mini sqauts 10 in low range      Lumbar Exercises: Supine   Clam Limitations red 2x10    Bent Knee Raise Limitations 2x10   Other Supine Lumbar Exercises reviewed bridging and SLR for proegression for future HEP      Moist Heat Therapy   Moist Heat Location Lumbar Spine     Manual Therapy   Manual therapy comments  PA glide on the right hip; , Posterior glide on the right to improve right hip flexion; Ankle DF stretch; ankle anterior draw glide. long axis disraction to the right hip. PROM of the right ankle/ anteriro drawer glide.                  PT Education - 10/13/16 [phone]    Education provided Yes   Education Details reviewed HEP, reviewed progression of activity    Person(s) Educated Patient   Methods Explanation;Demonstration   Comprehension  Verbalized understanding;Returned demonstration;Verbal cues required;Tactile cues required  PT Short Term Goals - 09/08/16 1118      PT SHORT TERM GOAL #1   Title Patient will increase lumbar flexion and extension by 25%    Baseline flexion still limited 75% with pain 4/4   Time 4   Period Weeks   Status On-going     PT SHORT TERM GOAL #2   Title Patient will increase gross right ankle strength to 4/5 for ER/IR and DF    Baseline 3+/5 eversion all other movements WN:L    Time 4   Period Weeks   Status On-going     PT SHORT TERM GOAL #3   Title Patient will demsotrate a good core contraction    Baseline good core contraction noted with cuing   Time 4   Period Weeks   Status On-going     PT SHORT TERM GOAL #4   Title Patient will be independent with initial HEP    Baseline indepenent with intial HEP    Time 4   Period Weeks   Status Achieved           PT Long Term Goals - 09/06/16 1403      PT LONG TERM GOAL #1   Title Patient will stand for 1 hour without report of ankle and back pain    Baseline continued limitations in standing 4/4    Time 8   Period Weeks   Status On-going     PT LONG TERM GOAL #2   Title Patient will go up/down 8 steps without increased pain    Baseline continues to have pain with steps    Status On-going     PT LONG TERM GOAL #3   Title Patient will ambualte 3000' without increased pain in order to go shopping   Baseline limited household distances    Time 8   Period Weeks   Status On-going     PT LONG TERM GOAL #4   Title Patient will return to gym with a program to improve core and lower extremity strength    Time 8   Period Weeks   Status On-going               Plan - 10/13/16 0827    Clinical Impression Statement Patient was 6 minutes late for her appointment. She has made good progress with therapy but continues to have pain. she has increased her activity level. She will continue her exercises on her own  at home. She has a complete program as well as progression. She was strengly encourgaged to continue to work on her ankle ROM which is still limited.    Rehab Potential Good   Clinical Impairments Affecting Rehab Potential long standing lower back pain    PT Frequency 2x / week   PT Duration 8 weeks   PT Treatment/Interventions ADLs/Self Care Home Management;Cryotherapy;Electrical Stimulation;Gait training;Stair training;Ultrasound;Traction;Iontophoresis 44m/ml Dexamethasone;Moist Heat;Therapeutic activities;Therapeutic exercise;Neuromuscular re-education;Patient/family education;Manual techniques;Taping;Splinting;Energy conservation;Dry needling;Passive range of motion   PT Next Visit Plan consider maual therapy to increase hip motion, Continue with HEP; Consider light core strengthening clam shell, ball squeeze, Continue to perfrom manual DF ROM; add light resistance to ankle 4 way, patient is alergic to latex though. Continue to work on seated ankle exercises.     PT Home Exercise Plan LTR, abdominal breathing, single knee to chest stretch, ankle    Consulted and Agree with Plan of Care Patient      Patient will benefit from skilled therapeutic intervention in order to  improve the following deficits and impairments:  Abnormal gait, Decreased strength, Decreased mobility, Pain, Increased muscle spasms, Decreased endurance, Decreased safety awareness, Decreased activity tolerance  Visit Diagnosis: Chronic bilateral low back pain without sciatica - Plan: PT plan of care cert/re-cert  Muscle spasm of back - Plan: PT plan of care cert/re-cert  Difficulty in walking, not elsewhere classified - Plan: PT plan of care cert/re-cert     Problem List Patient Active Problem List   Diagnosis Date Noted  . Flank pain 08/29/2016  . Midline low back pain without sciatica 04/19/2016  . Abnormal weight gain 06/11/2015  . BMI 40.0-44.9, adult ([place]) 06/11/2015  . Spells 01/03/2015  . Asthma 08/24/2014   . Chronic diastolic heart failure ([place]) 06/19/2014  . Allergic rhinitis 05/04/2014  . Severe persistent asthma 05/04/2014  . Pharyngoesophageal dysphagia 01/30/2014  . Hypothyroidism 07/02/2013  . Chronic chest pain   . GERD (gastroesophageal reflux disease) 02/10/2013  . Biliary dyskinesia 12/30/2012  . Exertional dyspnea 09/19/2012  . Status post VNS (vagus nerve stimulator) placement 06/10/2012  . Refusal of blood transfusions as patient is Jehovah's Witness 05/20/2012  . Dyslipidemia 12/29/2011  . 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 Dyslipidemia, goal LDL below 70 05/17/2011  . Non-cardiac chest pain secondary to GERD 04/24/2011  . HTN, goal below 130/80 04/24/2011   PHYSICAL THERAPY DISCHARGE SUMMARY  Visits from Start of Care:20  Current functional level related to goals / functional outcomes: Improved mobility and activity. HEP to control back pain.    Remaining deficits: Pain in the ankle. Back pain at times    Education / Equipment: HEP Plan: Patient agrees to discharge.  Patient goals were met. Patient is being discharged due to being pleased with the current functional level.  ?????     Carney Living PT [name]  10/13/2016, 11:48 AM  [facility] [address] [place], [place], [address] Phone: [phone]   Fax:  [phone]  Name: NDIDI NESBY MRN: [id number] Date of Birth: 09-15-1965

## 2016-10-26 DIAGNOSIS — Z6841 Body Mass Index (BMI) 40.0 and over, adult: Secondary | ICD-10-CM | POA: Diagnosis not present

## 2016-10-26 DIAGNOSIS — I5181 Takotsubo syndrome: Secondary | ICD-10-CM | POA: Insufficient documentation

## 2016-10-26 DIAGNOSIS — I259 Chronic ischemic heart disease, unspecified: Secondary | ICD-10-CM | POA: Diagnosis not present

## 2016-10-26 DIAGNOSIS — I10 Essential (primary) hypertension: Secondary | ICD-10-CM | POA: Diagnosis not present

## 2016-10-26 HISTORY — DX: Takotsubo syndrome: I51.81

## 2016-11-02 DIAGNOSIS — M545 Low back pain: Secondary | ICD-10-CM | POA: Diagnosis not present

## 2016-11-02 DIAGNOSIS — I5032 Chronic diastolic (congestive) heart failure: Secondary | ICD-10-CM | POA: Diagnosis not present

## 2016-11-02 DIAGNOSIS — I110 Hypertensive heart disease with heart failure: Secondary | ICD-10-CM | POA: Diagnosis not present

## 2016-11-02 DIAGNOSIS — M79601 Pain in right arm: Secondary | ICD-10-CM | POA: Diagnosis not present

## 2016-11-06 ENCOUNTER — Telehealth: Payer: Self-pay | Admitting: Cardiology

## 2016-11-06 MED ORDER — LOSARTAN POTASSIUM 50 MG PO TABS: 50.0000 mg | ORAL_TABLET | Freq: Every day | ORAL | 6 refills | Status: DC

## 2016-11-06 MED ORDER — NITROGLYCERIN 0.4 MG SL SUBL: 0.4000 mg | SUBLINGUAL_TABLET | SUBLINGUAL | 5 refills | Status: DC | PRN

## 2016-11-06 MED ORDER — VERAPAMIL HCL 80 MG PO TABS: 80.0000 mg | ORAL_TABLET | Freq: Three times a day (TID) | ORAL | 6 refills | Status: DC

## 2016-11-06 NOTE — Addendum Note (Signed)
Addended by: Derl Barrow on: 11/06/2016 03:28 PM   Modules accepted: Orders

## 2016-11-06 NOTE — Telephone Encounter (Signed)
Pt states she has been having more chest pain/spasms recently, relieved  with 1-2 NTG. Pt states she has been off verapamil for a couple of months because the pharmacy has been unable to get a refill authorization.  Pt states in the past verapamil has helped her chest pain. Pt asking if she should go back on verapamil, pt does not know dose,last dose in Epic 120 mg tid, and plan  follow up with Dr Meda Coffee at her 11/30/16 appointment, or if there are other recommendations.  Pt advised I will forward to Richardson Dopp, PA for review.

## 2016-11-06 NOTE — Telephone Encounter (Signed)
I discussed recommendations with patient, verbalized understanding, pt agreed with plan.

## 2016-11-06 NOTE — Telephone Encounter (Signed)
[name] is calling to find out if  Dr. Meda Coffee wants her to continue with the Ramipril. Please call .Marland Kitchen Thanks

## 2016-11-06 NOTE — Telephone Encounter (Signed)
Chart reviewed - recent BPs may not tolerate Verapamil 120 mg. Decrease Losartan to 50 mg Once daily  Start Verapamil 80 mg Three times a day  Keep follow up as planned.  Richardson Dopp, PA-C    11/06/2016 1:19 PM

## 2016-11-06 NOTE — Telephone Encounter (Signed)
°*  STAT* If patient is at the pharmacy, call can be transferred to refill team.   1. Which medications need to be refilled? (please list name of each medication and dose if known) Nitro ( pills)  2. Which pharmacy/location (including street and city if local pharmacy) is medication to be sent to?[facility] on [address]   *  3. Do they need a 30 day or 90 day supply?[place]

## 2016-11-06 NOTE — Telephone Encounter (Signed)
Pt's medication of Nitro, pills, was sent to pt's pharmacy as requested. Confirmation received.

## 2016-11-09 ENCOUNTER — Encounter: Payer: Self-pay | Admitting: Internal Medicine

## 2016-11-09 ENCOUNTER — Ambulatory Visit (INDEPENDENT_AMBULATORY_CARE_PROVIDER_SITE_OTHER): Payer: Medicare Other | Admitting: Internal Medicine

## 2016-11-09 VITALS — BP 128/80 | HR 80 | Temp 97.5°F | Ht 65.5 in | Wt 291.6 lb

## 2016-11-09 DIAGNOSIS — E039 Hypothyroidism, unspecified: Secondary | ICD-10-CM | POA: Diagnosis not present

## 2016-11-09 DIAGNOSIS — R635 Abnormal weight gain: Secondary | ICD-10-CM

## 2016-11-09 DIAGNOSIS — L2489 Irritant contact dermatitis due to other agents: Secondary | ICD-10-CM

## 2016-11-09 DIAGNOSIS — I5032 Chronic diastolic (congestive) heart failure: Secondary | ICD-10-CM

## 2016-11-09 DIAGNOSIS — L259 Unspecified contact dermatitis, unspecified cause: Secondary | ICD-10-CM

## 2016-11-09 DIAGNOSIS — K219 Gastro-esophageal reflux disease without esophagitis: Secondary | ICD-10-CM

## 2016-11-09 DIAGNOSIS — R0789 Other chest pain: Secondary | ICD-10-CM | POA: Diagnosis not present

## 2016-11-09 DIAGNOSIS — M545 Low back pain, unspecified: Secondary | ICD-10-CM

## 2016-11-09 DIAGNOSIS — R079 Chest pain, unspecified: Secondary | ICD-10-CM

## 2016-11-09 DIAGNOSIS — Z801 Family history of malignant neoplasm of trachea, bronchus and lung: Secondary | ICD-10-CM

## 2016-11-09 DIAGNOSIS — Z8249 Family history of ischemic heart disease and other diseases of the circulatory system: Secondary | ICD-10-CM

## 2016-11-09 DIAGNOSIS — Z6841 Body Mass Index (BMI) 40.0 and over, adult: Secondary | ICD-10-CM

## 2016-11-09 DIAGNOSIS — I110 Hypertensive heart disease with heart failure: Secondary | ICD-10-CM | POA: Diagnosis not present

## 2016-11-09 DIAGNOSIS — L732 Hidradenitis suppurativa: Secondary | ICD-10-CM | POA: Diagnosis not present

## 2016-11-09 DIAGNOSIS — G8929 Other chronic pain: Secondary | ICD-10-CM | POA: Diagnosis not present

## 2016-11-09 DIAGNOSIS — Z833 Family history of diabetes mellitus: Secondary | ICD-10-CM

## 2016-11-09 DIAGNOSIS — J45909 Unspecified asthma, uncomplicated: Secondary | ICD-10-CM | POA: Diagnosis not present

## 2016-11-09 DIAGNOSIS — E876 Hypokalemia: Secondary | ICD-10-CM

## 2016-11-09 DIAGNOSIS — G40919 Epilepsy, unspecified, intractable, without status epilepticus: Secondary | ICD-10-CM | POA: Diagnosis not present

## 2016-11-09 DIAGNOSIS — I10 Essential (primary) hypertension: Secondary | ICD-10-CM

## 2016-11-09 DIAGNOSIS — Z79899 Other long term (current) drug therapy: Secondary | ICD-10-CM

## 2016-11-09 DIAGNOSIS — E038 Other specified hypothyroidism: Secondary | ICD-10-CM

## 2016-11-09 HISTORY — DX: Unspecified contact dermatitis, unspecified cause: L25.9

## 2016-11-09 HISTORY — DX: Hidradenitis suppurativa: L73.2

## 2016-11-09 MED ORDER — HYDROCORTISONE 1 % EX CREA: TOPICAL_CREAM | CUTANEOUS | 1 refills | Status: DC

## 2016-11-09 NOTE — Addendum Note (Signed)
Addended by: Lorella Nimrod on: 11/09/2016 02:38 PM   Modules accepted: Level of Service

## 2016-11-09 NOTE — Assessment & Plan Note (Signed)
BP Readings from Last 3 Encounters:  11/09/16 128/80  09/20/16 (!) 150/80  09/03/16 115/85   She was normotensive today.  -She should continue her home dose of Coreg, Lasix, losartan and spironolactone.
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
## 2016-11-09 NOTE — Assessment & Plan Note (Signed)
She had an history of hypothyroidism. Her last TSH done in March 2018 was 3.383.  -Continue home dose of Synthroid 50 MCG.

## 2016-11-09 NOTE — Assessment & Plan Note (Signed)
She to get frequent GERD symptoms, occasionally complaining of esophageal spasms.  -Continue protonix.

## 2016-11-09 NOTE — Assessment & Plan Note (Signed)
She was also complaining of multiple axillary lesions, they become tender and nodular followed by some drainage. She normally uses warm compresses to help. Denies any fever or chills.  On exam.Multiple axillary skin tags and nodularity, with couple of open lesions, no active drainage, tender at one spot in the right lower axilla.  -She was advised to continue warm compresses when needed, if it  becomes infected or she developed abscess, she was advised to contact clinic for a possible I&D and antibiotics.

## 2016-11-09 NOTE — Assessment & Plan Note (Signed)
She also has an history of asthma and seasonal allergies, she sees a pulmonologist. Her symptoms are well controlled on Flonase, Singulair, Dulera and albuterol PRN. She did not had any acute exacerbation currently.  Her chest was clear.  -Continue home meds.

## 2016-11-09 NOTE — Assessment & Plan Note (Signed)
He had grade 2 diastolic dysfunction on echo done in October 2017. She had normal cardiac catheterization.  She did not had any lower extremity edema, chest was clear.  -Continue home dose of Lasix, spironolactone and Coreg.

## 2016-11-09 NOTE — Progress Notes (Addendum)
CC: To establish care, complaining of rash in her feet after going in swimming pool.  HPI:  [name] is a 51 y.o. with past medical history as listed below came to the clinic to establish care.  Rash on her feet. Patient recently started doing water exercises for weight reduction, she was complaining of rash with some skin breakage bilaterally in her both feet covering both plantar surfaces and extending up to below ankle and partial dorsal surface. She was complaining of itching and burning sensations right after the pool.  Axillary lesions. She was also complaining of multiple axillary lesions, they become tender and nodular followed by some drainage. She normally uses warm compresses to help. Denies any fever or chills.  Chronic exertional chest pain and dyspnea. She had a long-standing history of exertional dyspnea and chest pain, investigated with multiple cardiac catheterization, Myoview and echo-which shows no obstruction, echo was only positive for grade 1 diastolic dysfunction. She follow-up with cardiology and diagnosed with microvascular angina. She uses nitroglycerin patches and nitroglycerin sublingual when necessary. She states that she become dyspneic and started getting tightness around her chest after walking 30 minutes on a flat surface and after going one flight upstairs.  History of Asthma. She also has an history of asthma and seasonal allergies, she sees a pulmonologist. Her symptoms are well controlled on Flonase, Singulair, Dulera and albuterol PRN. She did not had any acute exacerbation currently.  History of seizures. According to chart review, she had a long history of staring spells and tonic-clonic seizure since childhood. Vagal nerve stimulator implantation was done in 2003 with a battery replacement in December 2013. She continued to complaint of multiple spells each month, mostly triggered with exertion or emotional stress, her EEG remained normal. And  most of this time there is no witness. She follow-up with [facility] neurology. They kept her on Topamax 200 mg twice daily. According to patient she still gets those spells, last spell was last week. Do not remember much detail.  Right arm pain. She had MRSA bacteremia in 2014, due to wound infection after replacing the batteries of her vagal nerve stimulator. She was treated with IV antibiotics for 6 weeks. Since her Port-A-Cath was pulled, she is complaining of this severe intermittent spasmodic pain along with weakness of right arm. No obvious deformity or focal weakness.  Morbid obesity. She is morbidly obese with recent BMI of 47.79. She did participated with weight reduction program , recently started seeing Dr. Osvaldo Human at [facility] for bariatric surgery and participating with their nutritional classes. She had more central obesity.  Hypertension. Her blood pressure is well controlled on losartan, Lasix and spironolactone.  Hypokalemia. She also has an history of becoming hypokalemic, recently spironolactone was added to her regimen. Her previous 2 BMP was normal.  Social history. Lives with her 53 year old autistic daughter. Nonsmoker, occasionally drink alcohol, denies any illicit drug use.  Family history. Siblings with hypertension, grandfather had diabetes, one brother died of lung cancer.  LMP. 4 month ago.  Past Medical History:  Diagnosis Date  . Asthma   . Biliary dyskinesia    a. s/p cholecystectomy.  . 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 Abnl stress [place] 2008, f/u cath reportedly nl. b. ETT-Myoview 04/2011 - EKG changes but normal perfusion. Cor CT - no coronary calcium, no definite stenosis though mRCA not fully evaluated. c. 10/2011 - tn elevated in [place], LHC without CAD. Started on  Ranexa, anti-anginals ?microvascular dz but later stopped while in hospital on abx.  . Complication of anesthesia    hard to wake up-had to be reminded to breath   . GERD (gastroesophageal reflux disease)    a. Severe.  Marland Kitchen HTN (hypertension)   . Hx of cardiovascular stress test    [facility] Myoview 8/14:  Normal, EF 74%  . Hx of echocardiogram    Echo 3/16:  Mild LVH, EF 55-60%, Gr 1 DD, trivial MR, mild LAE, normal RVF  . Hypothyroid   . MI (myocardial infarction) ([place])   . Migraine   . MRSA infection    a. After vagal nerve stimulator at [facility] - surgical site MRSA infection, PICC placed.  . Obesity   . Palpitations    a. 08/2014: 48 hour holter with 2 PVCs otherwise normal.  . Seizure disorder ([place])    a. since childhood. b. s/p vagal nerve stimulator at [facility].  . Seizures ([place])     Review of Systems:  As per HPI.  Physical Exam:  Vitals:   11/09/16 0910  BP: 128/80  Pulse: 80  Temp: 97.5 F (36.4 C)  TempSrc: Oral  SpO2: 100%  Weight: 291 lb 9.6 oz (132.3 kg)  Height: 5' 5.5" (1.664 m)    General: Vital signs reviewed.  Patient is well-developed and well-nourished,Obese with prominent central obasity, in no acute distress and cooperative with exam.  Head: Normocephalic and atraumatic. Eyes: EOMI, conjunctivae normal, no scleral icterus.  Neck: Supple, trachea midline, normal ROM, no JVD, masses, thyromegaly, or carotid bruit present.  Cardiovascular: RRR, S1 normal, S2 normal, no murmurs, gallops, or rubs. Pulmonary/Chest: Clear to auscultation bilaterally, no wheezes, rales, or rhonchi. Abdominal: Soft, non-tender, obese, non-distended, BS +, no masses, organomegaly, or guarding present.  Musculoskeletal: No joint deformities, erythema, or stiffness, ROM full and nontender. Extremities: No lower extremity edema bilaterally,  pulses symmetric and intact bilaterally. No cyanosis or clubbing. Neurological: A&O x3, Strength is normal and symmetric bilaterally, cranial nerve II-XII are grossly intact, no focal motor deficit, sensory intact to light touch bilaterally.  Skin: Warm, dry and intact. Multiple axillary skin tags and  nodularity, with couple of open lesions, no active drainage, tender at one spot in the right lower axilla. Dry peeling skin bilaterally in her both feet mostly plantar area and some of dorsal area. No obvious erythema or edema. Onychomycosis of multiple toes. Psychiatric: Normal mood and affect. speech and behavior is normal. Cognition and memory are normal.  Assessment & Plan:   See Encounters Tab for problem based charting.  Patient discussed with Dr. Daryll Drown.

## 2016-11-09 NOTE — Assessment & Plan Note (Signed)
Patient recently started doing water exercises for weight reduction, she was complaining of rash with some skin breakage bilaterally in her both feet covering both plantar surfaces and extending up to below ankle and partial dorsal surface. She was complaining of itching and burning sensations right after the pool.  On exam. There was tried peeling skin in her both feet covering her plantar surfaces and extending up to the dorsum and below ankle.  Most likely due to contact dermatitis, because of high chlorine content of the pool water.  -I suggested patient use pool shoes. Use good emollient like Vaseline or Aquaphor. She can use hydrocortisone cream for her burning and itching.

## 2016-11-09 NOTE — Assessment & Plan Note (Signed)
She had a long-standing history of exertional dyspnea and chest pain, investigated with multiple cardiac catheterization, Myoview and echo-which shows no obstruction, echo was only positive for grade 1 diastolic dysfunction. She follow-up with cardiology and diagnosed with microvascular angina. She uses nitroglycerin patches and nitroglycerin sublingual when necessary. She states that she become dyspneic and started getting tightness around her chest after walking 30 minutes on a flat surface and after going one flight upstairs.  She should continue to exercise to build up her endurance . -Continue nitroglycerin patch and sublingual nitroglycerin PRN.

## 2016-11-09 NOTE — Assessment & Plan Note (Signed)
She is morbidly obese with recent BMI of 47.79. She did participated with weight reduction program , recently started seeing Dr. Osvaldo Human at [facility] for bariatric surgery and participating with their nutritional classes. She had more central obesity.  Because of her central obesity and recurrent hypokalemia, no other obvious sign of Cushing's. I will check her cortisol level.  -She will continue working with her nutrition classes for an upcoming bariatric surgery in the future.

## 2016-11-09 NOTE — Assessment & Plan Note (Signed)
She states that her back pain started after having a small accident in 2000 while lifting some weight. She continued to get intermittent lower back pain since then. She has worked with physical therapy and states that it did help and she continued to do her back exercises. She do not want any pain medication.  Her pain was not really bothering her during current visit. And she did not had any restricted range of motion.  -Continue your back exercises.

## 2016-11-09 NOTE — Assessment & Plan Note (Signed)
According to chart review, she had a long history of staring spells and tonic-clonic seizure since childhood. Vagal nerve stimulator implantation was done in 2003 with a battery replacement in December 2013. She continued to complaint of multiple spells each month, mostly triggered with exertion or emotional stress, her EEG remained normal. And most of this time there is no witness. She follow-up with [facility] neurology. They kept her on Topamax 200 mg twice daily. According to patient she still gets those spells, last spell was last week. Do not remember much detail.  -Continue Topamax 200 mg twice daily. -Continue to follow up with neurology.

## 2016-11-10 ENCOUNTER — Encounter: Payer: Self-pay | Admitting: Cardiology

## 2016-11-10 LAB — CORTISOL: Cortisol: 7.5 ug/dL

## 2016-11-10 NOTE — Progress Notes (Signed)
Internal Medicine Clinic Attending  Case discussed with Dr. Amin at the time of the visit.  We reviewed the resident's history and exam and pertinent patient test results.  I agree with the assessment, diagnosis, and plan of care documented in the resident's note.    

## 2016-11-23 DIAGNOSIS — F332 Major depressive disorder, recurrent severe without psychotic features: Secondary | ICD-10-CM | POA: Diagnosis not present

## 2016-11-27 DIAGNOSIS — Z713 Dietary counseling and surveillance: Secondary | ICD-10-CM | POA: Diagnosis not present

## 2016-11-27 DIAGNOSIS — Z6841 Body Mass Index (BMI) 40.0 and over, adult: Secondary | ICD-10-CM | POA: Diagnosis not present

## 2016-11-29 ENCOUNTER — Telehealth: Payer: Self-pay | Admitting: *Deleted

## 2016-11-29 ENCOUNTER — Encounter: Payer: Self-pay | Admitting: *Deleted

## 2016-11-29 ENCOUNTER — Encounter: Payer: Self-pay | Admitting: Cardiology

## 2016-11-29 ENCOUNTER — Ambulatory Visit (INDEPENDENT_AMBULATORY_CARE_PROVIDER_SITE_OTHER): Payer: Medicare Other | Admitting: Cardiology

## 2016-11-29 VITALS — BP 136/84 | HR 103 | Ht 65.5 in | Wt 290.0 lb

## 2016-11-29 DIAGNOSIS — I5033 Acute on chronic diastolic (congestive) heart failure: Secondary | ICD-10-CM

## 2016-11-29 DIAGNOSIS — I5032 Chronic diastolic (congestive) heart failure: Secondary | ICD-10-CM | POA: Diagnosis not present

## 2016-11-29 DIAGNOSIS — R0609 Other forms of dyspnea: Secondary | ICD-10-CM

## 2016-11-29 DIAGNOSIS — I208 Other forms of angina pectoris: Secondary | ICD-10-CM | POA: Diagnosis not present

## 2016-11-29 DIAGNOSIS — E876 Hypokalemia: Secondary | ICD-10-CM | POA: Diagnosis not present

## 2016-11-29 DIAGNOSIS — I110 Hypertensive heart disease with heart failure: Secondary | ICD-10-CM

## 2016-11-29 DIAGNOSIS — R0600 Dyspnea, unspecified: Secondary | ICD-10-CM

## 2016-11-29 LAB — BASIC METABOLIC PANEL
BUN/Creatinine Ratio: 17 (ref 9–23)
BUN: 13 mg/dL (ref 6–24)
CO2: 18 mmol/L — ABNORMAL LOW (ref 20–29)
Calcium: 9.3 mg/dL (ref 8.7–10.2)
Chloride: 107 mmol/L — ABNORMAL HIGH (ref 96–106)
Creatinine, Ser: 0.78 mg/dL (ref 0.57–1.00)
GFR calc Af Amer: 101 mL/min/{1.73_m2} (ref >59)
GFR calc non Af Amer: 88 mL/min/{1.73_m2} (ref >59)
Glucose: 99 mg/dL (ref 65–99)
Potassium: 4.6 mmol/L (ref 3.5–5.2)
Sodium: 140 mmol/L (ref 134–144)

## 2016-11-29 LAB — PRO B NATRIURETIC PEPTIDE: NT-Pro BNP: 9 pg/mL (ref 0–249)

## 2016-11-29 MED ORDER — VERAPAMIL HCL ER 240 MG PO TBCR: 240.0000 mg | EXTENDED_RELEASE_TABLET | Freq: Every day | ORAL | 2 refills | Status: DC

## 2016-11-29 NOTE — Patient Instructions (Signed)
Medication Instructions:   STOP TAKING SHORT ACTING VERAPAMIL  START TAKING VERAPAMIL ER 240 MG ONCE DAILY    Labwork:  TODAY--BMET AND PRO-BNP     Testing/Procedures:  Your physician has recommended that you have a pulmonary function test. Pulmonary Function Tests are a group of tests that measure how well air moves in and out of your lungs.   Your physician has recommended that you have a sleep study. This test records several body functions during sleep, including: brain activity, eye movement, oxygen and carbon dioxide blood levels, heart rate and rhythm, breathing rate and rhythm, the flow of air through your mouth and nose, snoring, body muscle movements, and chest and belly movement.      Follow-Up:  4 MONTHS WITH DR Meda Coffee       If you need a refill on your cardiac medications before your next appointment, please call your pharmacy.

## 2016-11-29 NOTE — Progress Notes (Signed)
Cardiology Office Note:    Date:  11/29/2016   ID:  [name], DOB 1965-06-10, MRN [id number]  PCP:  Mackie Pai, MD  Cardiologist:  Dr. Loralie Champagne >> Dr. Ena Dawley   Electrophysiologist:  n/a GI: Dr. Collene Mares [facility]) Pulmonology: Dr. Soledad Gerlach [facility])  Referring MD: Mackie Pai, MD   No chief complaint on file.   History of Present Illness:    LUCI BELLUCCI is a 51 y.o. female with a complex hx of chest pain and shortness of breath.  Her hx includes prior negative caths as well as migraines, asthma, seizure d/o s/p vagal nerve stimulator and HTN. She has had chest pain chronically over a number of years. She had an abnormal stress test in [place] in 2008. She was then sent for a cardiac cath, which was reportedly normal. We had her do an ETT-myoview in this office in 11/12. This showed ischemic-type ST depression on exercise ECG, but there was no evidence for ischemia on myoview images. EF was 66%. Given ongoing chest pain and ischemic ECG changes with normal perfusion images (equivocal study), she underwent a coronary CT angiogram. This was a difficult study because we were not able to get her HR as low as would be ideal. However, she had no coronary calcium and no definite stenosis was seen (though mid-RCA was not fully evaluated due to artifact). Dr. [name] felt that this was a negative study. She continued to have chest tightness with walking long distances, climbing a flight of steps, and housework. In 5/13, she went to the ER in [place] with particularly bad exertional chest pain that lasted a prolonged time. Troponin was mildly elevated with normal CKMB. She had a left heart cath in the hospital in [place] which showed no angiographic CAD. Echo showed normal EF. 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 She stopped amlodipine due to ankle swelling.  She is followed by [facility]. Vagal nerve  stimulator implantation in 4709 was complicated by MRSA infection.  Echocardiogram in 10/17 demonstrated normal LV function with moderate diastolic dysfunction. Myoview in 10/17 demonstrated no ischemia. Right heart catheterization in 12/17 demonstrated normal right and left heart filling pressures and no evidence of pulmonary hypertension. She was admitted in 3/18 with chest pain. Cardiac enzymes remained negative. No further ischemic evaluation was recommended.  She established with Dr. Meda Coffee in 3/18. There was some concern for volume excess. Her Lasix was adjusted.  She was seen again in the emergency room 09/02/16. She complained of chest pain. There was some vascular congestion on chest x-ray. She was given IV Lasix x 1. She was also hypokalemic and this was replaced. She had improvement in her symptoms with GI cocktail. As needed nitroglycerin was recommended for probable esophageal spasm.  She is here alone.  She tells me that the IV Lasix provided her more relief than the GI cocktail.  She has chronic exertional chest pain that is relieved with NTG.  Her breathing is improved since she went to the ED.  However, she continues to have dyspnea on exertion with mild to moderate activity.  She sleeps on 3 pillows and notes occasional PND.  She notes some hand and ankle edema today.  She denies syncope.   11/29/2016 - 2 months follow-up, the patient underwent right heart catheterization that didn't show any pulmonary hypertension and normal intracardiac pressures. She continues to feel significant shortness of breath especially when walking  upstairs associated with chest tightness, sublingual nitroglycerin helps with that pain. She also states that she snores at night and never had sleep study done before. She is considering gastric bypass surgery and is looking for Center for evaluation. Intermittent lower extremity edema that is relieved by Lasix. No orthopnea or paroxysmal nocturnal dyspnea. Compliant  with her meds.   Prior CV studies:   The following studies were reviewed today:  RHC 06/01/16 RA mean 7 RV 35/8 PA 31/12, mean 20 PCWP mean 10 Oxygen saturations: PA 66% AO 98% Cardiac Output (Fick) 5.88  Cardiac Index (Fick) 2.59 Normal right and left heart filling pressures, no pulmonary hypertension.  Normal cardiac output.   Myoview 04/04/16 EF 49, no ischemia, low risk  Echo 04/04/16 Mild concentric LVH, EF 60-65, normal wall motion, grade 2 diastolic dysfunction  48 Hr Holter 08/2014 No arrhythmia; rare PVC/PACs  Echo 08/28/14 Mild LVH, EF 55-60, Gr 1 DD, trivial MR, mild LAE  Myoview 02/17/14 [facility]) Lexiscan - No ischemia, EF 70%  Echo 09/30/12 Mild LVH. EF 55-65%. No RWMA, Gr 2 DD, trivial AI   Past Medical History:  Diagnosis Date  . Asthma   . Biliary dyskinesia    a. s/p cholecystectomy.  . 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 Abnl stress [place] 2008, f/u cath reportedly nl. b. ETT-Myoview 04/2011 - EKG changes but normal perfusion. Cor CT - no coronary calcium, no definite stenosis though mRCA not fully evaluated. c. 10/2011 - tn elevated in [place], LHC without CAD. Started on Ranexa, anti-anginals ?microvascular dz but later stopped while in hospital on abx.  . Complication of anesthesia    hard to wake up-had to be reminded to breath  . GERD (gastroesophageal reflux disease)    a. Severe.  Marland Kitchen HTN (hypertension)   . Hx of cardiovascular stress test    Lex Myoview 8/14:  Normal, EF 74%  . Hx of echocardiogram    Echo 3/16:  Mild LVH, EF 55-60%, Gr 1 DD, trivial MR, mild LAE, normal RVF  . Hypothyroid   . MI (myocardial infarction) ([place])   . Migraine   . MRSA infection    a. After vagal nerve stimulator at [facility] - surgical site MRSA infection, PICC placed.  . Obesity   . Palpitations    a. 08/2014: 48 hour holter with 2 PVCs otherwise normal.  . Seizure disorder ([place])    a. since childhood. b. s/p vagal nerve stimulator at  [facility].  . Seizures ([place])   1. Chest pain: Multiple episodes over the years. She had an abnormal stress test in 2008 in [place] and it sounds like she went to [facility] for a cardiac catheterization which per her report was normal. Stress echo at [facility] in 2010 was submaximal but showed no evidence for ischemia. ETT-myoview (11/12): 7'15", EF 66%, significant ST depression on exercise ECG, apical thinning on myoview images but no evidence for ischemia. Coronary CT angiogram (11/12): calcium score 0, difficult study with gating artifact, mid RCA nonevaluatable but no stenosis elsewhere in the coronaries. Echo (11/12): EF 55-60%, mild LV hypertrophy, mild MR. NSTEMI in [place] in 5/13 with elevated troponin but LHC showed normal coronaries. Echo (5/13) with EF 60-65%, normal valve, no LVH. 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 Echo 09/30/12: Mild LVH, EF 55-65%, and GR 2 DD, trivial AI. 2. Migraines: Uses triptan medications.  3. HTN  4. Seizure disorder: Has vagus nerve stimulator.  5. Hypothyroidism  6. Asthma  since childhood  Past Surgical History:  Procedure Laterality Date  . [place] N/A 06/01/2016   Procedure: Right Heart Cath;  Surgeon: Larey Dresser, MD;  Location: [place] CV LAB;  Service: Cardiovascular;  Laterality: N/A;  . CESAREAN SECTION     placement of vagal nerve stimulator.  . CHOLECYSTECTOMY N/A 01/24/2013   Procedure: LAPAROSCOPIC CHOLECYSTECTOMY;  Surgeon: Harl Bowie, MD;  Location: [place];  Service: General;  Laterality: N/A;  . IMPLANTATION VAGAL NERVE STIMULATOR  [id number]   [facility]    Current Medications: Current Meds  Medication Sig  . acetaminophen (TYLENOL) 325 MG tablet Take 975 mg by mouth every 6 (six) hours as needed for headache.  . albuterol (PROVENTIL HFA;VENTOLIN HFA) 108 (90 Base) MCG/ACT inhaler Inhale 2 puffs into  the lungs every 6 (six) hours as needed for wheezing or shortness of breath.  . baclofen (LIORESAL) 10 MG tablet Take 10 mg by mouth 2 (two) times daily as needed for muscle spasms.   . carvedilol (COREG) 25 MG tablet Take 1 tablet (25 mg total) by mouth 2 (two) times daily with a meal.  . cyclobenzaprine (FLEXERIL) 5 MG tablet Take 5 mg by mouth 2 (two) times daily.  . fluticasone (FLONASE) 50 MCG/ACT nasal spray Place 1 spray into both nostrils 2 (two) times daily as needed for allergies.   . furosemide (LASIX) 40 MG tablet Take 1 tablet (40 mg total) by mouth 2 (two) times daily.  . hydrocortisone cream 1 % Apply to affected area 2 times daily  . isosorbide mononitrate (IMDUR) 60 MG 24 hr tablet TAKE 1 TABLET (60 MG TOTAL) BY MOUTH 2 (TWO) TIMES DAILY.  Marland Kitchen levothyroxine (SYNTHROID, LEVOTHROID) 50 MCG tablet Take 50 mcg by mouth daily.   Marland Kitchen losartan (COZAAR) 50 MG tablet Take 1 tablet (50 mg total) by mouth daily.  . mometasone-formoterol (DULERA) 100-5 MCG/ACT AERO Inhale 2 puffs into the lungs 2 (two) times daily.  . montelukast (SINGULAIR) 10 MG tablet Take 10 mg by mouth daily as needed (for allergies).   . nitroGLYCERIN (NITRODUR - DOSED IN MG/24 HR) 0.2 mg/hr patch PLACE 1 PATCH ONTO THE SKIN DAILY.  . nitroGLYCERIN (NITROSTAT) 0.4 MG SL tablet Place 1 tablet (0.4 mg total) under the tongue every 5 (five) minutes as needed for chest pain.  . pantoprazole (PROTONIX) 40 MG tablet Take 1 tablet (40 mg total) by mouth 2 (two) times daily.  . promethazine (PHENERGAN) 25 MG tablet Take 25 mg by mouth every 4 (four) hours as needed for nausea or vomiting.   Marland Kitchen spironolactone (ALDACTONE) 25 MG tablet Take 1 tablet (25 mg total) by mouth daily.  . sucralfate (CARAFATE) 1 g tablet Take 1 g by mouth 4 (four) times daily.  Marland Kitchen topiramate (TOPAMAX) 100 MG tablet Take 200 mg by mouth 2 (two) times daily.  . traZODone (DESYREL) 100 MG tablet Take 100 mg by mouth daily.  . Venlafaxine HCl 150 MG TB24 Take 150  mg by mouth daily.  . [DISCONTINUED] verapamil (CALAN) 120 MG tablet Take 120 mg by mouth 3 (three) times daily.     Allergies:   Amitriptyline; Amlodipine; Latex; Tramadol; Amantadines; Aspirin; Simvastatin; and Tape   Social History   Social History  . Marital status: Single    Spouse name: N/A  . Number of children: 2  . Years of education: N/A   Social History Main Topics  .  Smoking status: Never Smoker  . Smokeless tobacco: Never Used  . Alcohol use 0.0 oz/week    1 - 3 Glasses of wine per week     Comment: occasional  . Drug use: No  . Sexual activity: Yes    Birth control/ protection: None   Other Topics Concern  . None   Social History Narrative   Lives alone     Family History  Problem Relation Age of Onset  . Coronary artery disease Mother 39  . Coronary artery disease Maternal Grandmother   . Cancer Other   . Hypertension Other   . Stroke Other      ROS:   Please see the history of present illness.    Review of Systems  Constitution: Positive for diaphoresis.  Cardiovascular: Positive for chest pain, dyspnea on exertion and leg swelling.  Respiratory: Positive for shortness of breath.   Musculoskeletal: Positive for joint pain and joint swelling.  Gastrointestinal: Positive for nausea.  Neurological: Positive for dizziness and headaches.  Psychiatric/Behavioral: The patient is nervous/anxious.    All other systems reviewed and are negative.   EKGs/Labs/Other Test Reviewed:    EKG:  EKG is  ordered today.  The ekg ordered today demonstrates NSR, HR 84, normal axis, T-wave inversion 2, 3, aVF, V4-V6, QTc 439 ms, no change since prior tracings  Recent Labs: 08/04/2016: B Natriuretic Peptide 22.3; TSH 3.383 08/29/2016: NT-Pro BNP 21 09/02/2016: ALT 21; Hemoglobin 11.4; Platelets 210 10/05/2016: BUN 14; Creatinine, Ser 0.82; Potassium 4.1; Sodium 140   Recent Lipid Panel    Component Value Date/Time   CHOL 166 09/30/2012 0929   TRIG 56.0 09/30/2012  0929   HDL 51.30 09/30/2012 0929   CHOLHDL 3 09/30/2012 0929   VLDL 11.2 09/30/2012 0929   LDLCALC 104 (H) 09/30/2012 0929     Physical Exam:    VS:  BP 136/84   Pulse (!) 103   Ht 5' 5.5" (1.664 m)   Wt 290 lb (131.5 kg)   SpO2 97%   BMI 47.52 kg/m     Wt Readings from Last 3 Encounters:  11/29/16 290 lb (131.5 kg)  11/09/16 291 lb 9.6 oz (132.3 kg)  09/20/16 289 lb 12.8 oz (131.5 kg)     Physical Exam  Constitutional: She is oriented to person, place, and time. She appears well-developed and well-nourished. No distress.  HENT:  Head: Normocephalic and atraumatic.  Eyes: No scleral icterus.  Neck: Normal range of motion. No JVD (JVD is difficult to assess)) present.  Cardiovascular: Normal rate, regular rhythm, S1 normal, S2 normal and normal heart sounds.   No murmur heard. Pulmonary/Chest: She has decreased breath sounds. She has no wheezes. She has no rhonchi. She has no rales.  Abdominal: Soft. There is no tenderness.  Musculoskeletal: She exhibits no edema.  Neurological: She is alert and oriented to person, place, and time.  Skin: Skin is warm and dry.  Acanthosis nigricans about the base of her neck  Psychiatric: She has a normal mood and affect.    ASSESSMENT:    1. 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 Acute on chronic diastolic CHF (congestive heart failure), NYHA class 2 ([place])   3. Hypertensive heart disease with chronic diastolic congestive heart failure ([place])   4. Morbid obesity ([place])   5. Exertional dyspnea    PLAN:    In order of problems listed above:  1. Acute on chronic diastolic heart failure (HCC) -  Grade 2 diastolic dysfunction on echocardiogram  and normal LVEF, we'll continue Lasix 40 mg by mouth twice a day and she is advised to weight herself daily and take an extra Lasix 40 morning if her weight goes 3 pounds overnight or 5 in a week. She is advised about low sodium diet. She is advised to start walking daily at least a mile in 5000 steps a  day.  -  Continue current dose of Lasix and spironolactone  -  Check BMP and BNP today.  2. 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  She has exertional chest pain that is NTG responsive.    -  Continue isosorbide, nitroglycerin patch, verapamil, when necessary nitroglycerin  3. Hypertensive heart disease with chronic diastolic congestive heart failure (HCC) -  Blood pressure controlled.  4. Morbid obesity ([place]) - She is advised to go to bariatric center and [facility] for consultation.   5. Dyspnea on exertion - out of proportion for the level of CHF, we will refer patient for sleep study and PFTs. She has known asthma that is being treated.  6. Hypokalemia - we will check today  Dispo:  No Follow-up on file.    Medication Adjustments/Labs and Tests Ordered: Current medicines are reviewed at length with the patient today.  Concerns regarding medicines are outlined above.  Medication changes, Labs and Tests are noted below.  Please see Patient Instructions for other items discussed this visit.  No orders of the defined types were placed in this encounter.  Signed, Ena Dawley, MD  11/29/2016 10:57 AM    [address], [place],   [address] Phone: [phone]; Fax: [phone]

## 2016-11-29 NOTE — Telephone Encounter (Signed)
Per Dr Meda Coffee a split night sleep study has been ordered for the patient.  Informed patient of upcoming sleep study and patient understanding was verbalized. Patient understands her sleep study is scheduled for Tuesday January 30 2017. Patient understands her sleep study will be done at [facility] sleep lab. Patient understands she will receive a sleep packet in a week or so. Patient understands to call if she does not receive the sleep packet in a timely manner. Patient agrees with treatment and thanked me for call.

## 2016-11-30 ENCOUNTER — Ambulatory Visit: Payer: Medicare Other | Admitting: Cardiology

## 2016-12-13 ENCOUNTER — Ambulatory Visit (INDEPENDENT_AMBULATORY_CARE_PROVIDER_SITE_OTHER): Payer: Medicare Other | Admitting: Internal Medicine

## 2016-12-13 ENCOUNTER — Telehealth: Payer: Self-pay | Admitting: Internal Medicine

## 2016-12-13 DIAGNOSIS — R0600 Dyspnea, unspecified: Secondary | ICD-10-CM

## 2016-12-13 DIAGNOSIS — R0609 Other forms of dyspnea: Secondary | ICD-10-CM

## 2016-12-13 NOTE — Telephone Encounter (Signed)
New message    They attempted PFT, it could not be completed , pt almost had a seizure trying to do test. Please let Dr Rayann Heman know.

## 2016-12-13 NOTE — Progress Notes (Signed)
PFT was attempted today. During pre spirometry, pt stated that she was on the verge of having a seizure. PFT was stopped immediatly. Pt's vital signs were checked >> BP: 150/86, Pulse: 95, O2: 96% on RA. Dr. Annamaria Boots came over the PFT room and assessed the pt. I called [facility] and left a message for Dr. Jackalyn Lombard nurse to let Dr. Rayann Heman know that the test was attempted but could not be completed.  The pt stayed in the PFT room for 15 minutes then moved to the large waiting room. She stated that she did not feel comfortable driving yet. Shelby at our front desk was advised that the pt was going to wait in our lobby until she felt it was safe for her to drive. Wilburn Cornelia stated that she would keep an eye on the pt and let me know if the pt needed medical attention.

## 2016-12-15 NOTE — Telephone Encounter (Signed)
I have not seen this patient. Study ordered by Dr Meda Coffee.  I will forward to her.

## 2016-12-18 NOTE — Telephone Encounter (Signed)
Thank you for letting me know

## 2016-12-26 ENCOUNTER — Ambulatory Visit (INDEPENDENT_AMBULATORY_CARE_PROVIDER_SITE_OTHER): Payer: Medicare Other | Admitting: Orthopaedic Surgery

## 2016-12-26 ENCOUNTER — Encounter (INDEPENDENT_AMBULATORY_CARE_PROVIDER_SITE_OTHER): Payer: Self-pay | Admitting: Orthopaedic Surgery

## 2016-12-26 VITALS — BP 141/95 | HR 88 | Ht 65.5 in | Wt 290.0 lb

## 2016-12-26 DIAGNOSIS — M47816 Spondylosis without myelopathy or radiculopathy, lumbar region: Secondary | ICD-10-CM | POA: Diagnosis not present

## 2016-12-26 NOTE — Progress Notes (Signed)
Office Visit Note   Patient: [name]           Date of Birth: 1966-05-26           MRN: [id number] Visit Date: 12/26/2016              Requested by: Mackie Pai, [address], [place] [address] PCP: Mackie Pai, MD   Assessment & Plan: Visit Diagnoses: Lumbar spondylosis.  Plan: We discussed with the patient that hopefully weight loss surgery will help so that she can gradually become more active. We discussed looking pool therapy that she could do which would not bother her knees and back. She can look into this locally. She lives with her 13 year old daughter who is in school. We discussed anti-inflammatories weight loss to help with her knees and back core strengthening exercises to help. If she develops any radicular symptoms she can return otherwise office follow-up as needed.  Follow-Up Instructions: No Follow-up on file.   Orders:  No orders of the defined types were placed in this encounter.  No orders of the defined types were placed in this encounter.     Procedures: No procedures performed   Clinical Data: No additional findings.   Subjective: Chief Complaint  Patient presents with  . Lower Back - Pain, Follow-up  . Right Knee - Pain, Follow-up  . Right Ankle - Pain, Follow-up    HPI 51-year-old female returns with ongoing problems with back pain. She states she hasn't really worked since 2005. She's had problems with the right ankle that she sprained and sometimes feels like it will give way on her. She has upcoming weight loss surgery to be done at [facility]. She states she has increased problems with the bending. She states she hasn't been able to do her housework. She's been on meloxicam she's had other anti-inflammatories including Naprosyn in the past.  Review of Systems Positive for vagal nerve stimulator which was placed to help with seizure problems. She's had history of chronic diastolic heart failure, asthma,  hyperlipidemia, hypertension, noncardiac chest pain, GERD, increased BMI with morbid obesity. History of seizures. Otherwise negative as it pertains to her history of present illness. Previous CT scan January 2018 showed some endplate spurring and facet arthropathy without compression.  Objective: Vital Signs: BP (!) 141/95   Pulse 88   Ht 5' 5.5" (1.664 m)   Wt 290 lb (131.5 kg)   BMI 47.52 kg/m   Physical Exam  Constitutional: She is oriented to person, place, and time. She appears well-developed.  HENT:  Head: Normocephalic.  Right Ear: External ear normal.  Left Ear: External ear normal.  Eyes: Pupils are equal, round, and reactive to light.  Neck: No tracheal deviation present. No thyromegaly present.  Cardiovascular: Normal rate.   Pulmonary/Chest: Effort normal.  Abdominal: Soft.  Musculoskeletal:  Patient's able to walk on her toes with slight difficulty. September. Is better. She can heel walk negative straight leg raising 90 minimal crepitus with knee extension right and left. 1+ anterior drawer symmetrical right and left some tenderness anterolaterally over her right ankle. Normal hip range of motion negative popliteal compression test. She has some core weakness not able to do a sit up. She ambulates with a short stride gait. No lower extremity edema. No rash over exposed skin. Distal pulses are intact.  Neurological: She is alert and oriented to person, place, and time.  Skin: Skin is warm and dry.  Psychiatric: She has a normal  mood and affect. Her behavior is normal.    Ortho Exam  Specialty Comments:  No specialty comments available.  Imaging: No results found.   PMFS History: Patient Active Problem List   Diagnosis Date Noted  . Hydradenitis 11/09/2016  . Irritant contact dermatitis due to other agents 11/09/2016  . Takotsubo cardiomyopathy 10/26/2016  . Midline low back pain without sciatica 04/19/2016  . Abnormal weight gain 06/11/2015  . BMI  40.0-44.9, adult ([place]) 06/11/2015  . Spells 01/03/2015  . Asthma 08/24/2014  . Chronic diastolic heart failure ([place]) 06/19/2014  . Allergic rhinitis 05/04/2014  . Pharyngoesophageal dysphagia 01/30/2014  . Hypothyroidism 07/02/2013  . Chronic chest pain   . GERD (gastroesophageal reflux disease) 02/10/2013  . Biliary dyskinesia 12/30/2012  . Exertional dyspnea 09/19/2012  . Status post VNS (vagus nerve stimulator) placement 06/10/2012  . Refusal of blood transfusions as patient is Jehovah's Witness 05/20/2012  . Dyslipidemia 12/29/2011  . 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 Dyslipidemia, goal LDL below 70 05/17/2011  . Non-cardiac chest pain secondary to GERD 04/24/2011  . HTN, goal below 130/80 04/24/2011   Past Medical History:  Diagnosis Date  . Asthma   . Biliary dyskinesia    a. s/p cholecystectomy.  . 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 Abnl stress [place] 2008, f/u cath reportedly nl. b. ETT-Myoview 04/2011 - EKG changes but normal perfusion. Cor CT - no coronary calcium, no definite stenosis though mRCA not fully evaluated. c. 10/2011 - tn elevated in [place], LHC without CAD. Started on Ranexa, anti-anginals ?microvascular dz but later stopped while in hospital on abx.  . Complication of anesthesia    hard to wake up-had to be reminded to breath  . GERD (gastroesophageal reflux disease)    a. Severe.  Marland Kitchen HTN (hypertension)   . Hx of cardiovascular stress test    Lex Myoview 8/14:  Normal, EF 74%  . Hx of echocardiogram    Echo 3/16:  Mild LVH, EF 55-60%, Gr 1 DD, trivial MR, mild LAE, normal RVF  . Hypothyroid   . MI (myocardial infarction) ([place])   . Migraine   . MRSA infection    a. After vagal nerve stimulator at [facility] - surgical site MRSA infection, PICC placed.  . Obesity   . Palpitations    a. 08/2014: 48 hour holter with 2 PVCs otherwise normal.  . Seizure disorder ([place])    a. since  childhood. b. s/p vagal nerve stimulator at [facility].  . Seizures ([place])     Family History  Problem Relation Age of Onset  . Coronary artery disease Mother 78  . Coronary artery disease Maternal Grandmother   . Cancer Other   . Hypertension Other   . Stroke Other     Past Surgical History:  Procedure Laterality Date  . [place] N/A 06/01/2016   Procedure: Right Heart Cath;  Surgeon: Larey Dresser, MD;  Location: [place] CV LAB;  Service: Cardiovascular;  Laterality: N/A;  . CESAREAN SECTION     placement of vagal nerve stimulator.  . CHOLECYSTECTOMY N/A 01/24/2013   Procedure: LAPAROSCOPIC CHOLECYSTECTOMY;  Surgeon: Harl Bowie, MD;  Location: [place];  Service: General;  Laterality: N/A;  . IMPLANTATION VAGAL NERVE STIMULATOR  [phone]   [facility]   Social History   Occupational History  . Not on file.  Social History Main Topics  . Smoking status: Never Smoker  . Smokeless tobacco: Never Used  . Alcohol use 0.0 oz/week    1 - 3 Glasses of wine per week     Comment: occasional  . Drug use: No  . Sexual activity: Yes    Birth control/ protection: None

## 2017-01-15 DIAGNOSIS — Z0000 Encounter for general adult medical examination without abnormal findings: Secondary | ICD-10-CM | POA: Diagnosis not present

## 2017-01-15 DIAGNOSIS — E6609 Other obesity due to excess calories: Secondary | ICD-10-CM | POA: Diagnosis not present

## 2017-01-15 DIAGNOSIS — I10 Essential (primary) hypertension: Secondary | ICD-10-CM | POA: Diagnosis not present

## 2017-01-15 DIAGNOSIS — D529 Folate deficiency anemia, unspecified: Secondary | ICD-10-CM | POA: Diagnosis not present

## 2017-01-15 DIAGNOSIS — Z124 Encounter for screening for malignant neoplasm of cervix: Secondary | ICD-10-CM | POA: Diagnosis not present

## 2017-01-15 DIAGNOSIS — Z1389 Encounter for screening for other disorder: Secondary | ICD-10-CM | POA: Diagnosis not present

## 2017-01-15 DIAGNOSIS — D519 Vitamin B12 deficiency anemia, unspecified: Secondary | ICD-10-CM | POA: Diagnosis not present

## 2017-01-15 DIAGNOSIS — Z1212 Encounter for screening for malignant neoplasm of rectum: Secondary | ICD-10-CM | POA: Diagnosis not present

## 2017-01-15 DIAGNOSIS — D509 Iron deficiency anemia, unspecified: Secondary | ICD-10-CM | POA: Diagnosis not present

## 2017-01-15 DIAGNOSIS — Z713 Dietary counseling and surveillance: Secondary | ICD-10-CM | POA: Diagnosis not present

## 2017-01-15 DIAGNOSIS — Z6841 Body Mass Index (BMI) 40.0 and over, adult: Secondary | ICD-10-CM | POA: Diagnosis not present

## 2017-01-15 DIAGNOSIS — E039 Hypothyroidism, unspecified: Secondary | ICD-10-CM | POA: Diagnosis not present

## 2017-01-19 ENCOUNTER — Ambulatory Visit (INDEPENDENT_AMBULATORY_CARE_PROVIDER_SITE_OTHER): Payer: Medicare Other | Admitting: Internal Medicine

## 2017-01-19 VITALS — BP 131/84 | HR 87 | Temp 97.8°F | Ht 60.5 in | Wt 294.4 lb

## 2017-01-19 DIAGNOSIS — M79601 Pain in right arm: Secondary | ICD-10-CM

## 2017-01-19 DIAGNOSIS — Z79899 Other long term (current) drug therapy: Secondary | ICD-10-CM

## 2017-01-19 MED ORDER — GABAPENTIN 300 MG PO CAPS: 300.0000 mg | ORAL_CAPSULE | Freq: Every day | ORAL | 0 refills | Status: DC

## 2017-01-19 NOTE — Patient Instructions (Signed)
Ms. Denio it was nice meeting you today.  I have ordered a nerve conduction study.  Take gabapentin 300 mg once daily at bedtime.

## 2017-01-19 NOTE — Assessment & Plan Note (Signed)
History of present illness Patient reports having diffuse pain in her right upper extremity since a PICC line was pulled several years ago. States the pain is present in her entire right upper extremity including shoulder and is constant in nature. She describes the pain as aching. Tylenol and heating pads do not help. Denies having any neck pain. Her pain is associated with numbness in her entire left upper extremity (mostly on the dorsal aspect) and all 5 of her digits. Also reports having tingling in her right hand. States lifting objects makes the pain worse. Pain is 10 out of 10 sometimes.  Assessment Unclear etiology of her current presentation. Patient's current presentation could possibly be due to cervical radiculopathy, however, history and exam not suggestive of any specific dermatomal pattern. Noted to have normal strength and sensation on exam. Left shoulder and neck with normal range of motion.  Plan -Nerve conduction studies have ordered  -Gabapentin 300 mg at bedtime

## 2017-01-19 NOTE — Progress Notes (Signed)
   CC: Right arm pain  HPI:  [name] is a 51 y.o. female with a past medical history of conditions listed below presenting to the clinic complaining of pain in her right arm. Please see problem based charting for the status of the patient's current and chronic medical conditions.   Past Medical History:  Diagnosis Date  . Asthma   . Biliary dyskinesia    a. s/p cholecystectomy.  . 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 Abnl stress [place] 2008, f/u cath reportedly nl. b. ETT-Myoview 04/2011 - EKG changes but normal perfusion. Cor CT - no coronary calcium, no definite stenosis though mRCA not fully evaluated. c. 10/2011 - tn elevated in [place], LHC without CAD. Started on Ranexa, anti-anginals ?microvascular dz but later stopped while in hospital on abx.  . Complication of anesthesia    hard to wake up-had to be reminded to breath  . GERD (gastroesophageal reflux disease)    a. Severe.  Marland Kitchen HTN (hypertension)   . Hx of cardiovascular stress test    Lex Myoview 8/14:  Normal, EF 74%  . Hx of echocardiogram    Echo 3/16:  Mild LVH, EF 55-60%, Gr 1 DD, trivial MR, mild LAE, normal RVF  . Hypothyroid   . MI (myocardial infarction) ([place])   . Migraine   . MRSA infection    a. After vagal nerve stimulator at [facility] - surgical site MRSA infection, PICC placed.  . Obesity   . Palpitations    a. 08/2014: 48 hour holter with 2 PVCs otherwise normal.  . Seizure disorder ([place])    a. since childhood. b. s/p vagal nerve stimulator at [facility].  . Seizures ([place])    Review of Systems: Pertinent positives mentioned in HPI. Remainder of all ROS negative.   Physical Exam:  Vitals:   01/19/17 1012  BP: 131/84  Pulse: 87  Temp: 97.8 F (36.6 C)  TempSrc: Oral  SpO2: 100%  Weight: 294 lb 6.4 oz (133.5 kg)  Height: 5' 0.5" (1.537 m)   Physical Exam  Constitutional: She is oriented to person, place, and time. She appears well-developed and well-nourished. No  distress.  HENT:  Head: Normocephalic and atraumatic.  Eyes: Right eye exhibits no discharge. Left eye exhibits no discharge.  Cardiovascular: Normal rate, regular rhythm and intact distal pulses.   Pulmonary/Chest: Effort normal and breath sounds normal. No respiratory distress. She has no wheezes. She has no rales.  Abdominal: Soft. Bowel sounds are normal. She exhibits no distension. There is no tenderness.  Musculoskeletal: She exhibits no edema.  Left upper extremity: No erythema, increased warmth, or edema noted. Strength 5 out of 5 in sensation to light touch grossly intact. No muscle atrophy. Neck: Normal range of motion Shoulder: Normal range of motion  Neurological: She is alert and oriented to person, place, and time.  Skin: Skin is warm and dry.    Assessment & Plan:   See Encounters Tab for problem based charting.  Patient seen with Dr. Lynnae January

## 2017-01-23 NOTE — Progress Notes (Signed)
Internal Medicine Clinic Attending  Case discussed with Dr. Rathoreat the time of the visit. We reviewed the resident's history and exam and pertinent patient test results. I agree with the assessment, diagnosis, and plan of care documented in the resident's note.  

## 2017-01-30 ENCOUNTER — Ambulatory Visit (HOSPITAL_BASED_OUTPATIENT_CLINIC_OR_DEPARTMENT_OTHER): Payer: Medicare Other | Attending: Cardiology | Admitting: Cardiology

## 2017-01-30 VITALS — Ht 65.5 in | Wt 290.0 lb

## 2017-01-30 DIAGNOSIS — G4733 Obstructive sleep apnea (adult) (pediatric): Secondary | ICD-10-CM

## 2017-01-30 DIAGNOSIS — G4736 Sleep related hypoventilation in conditions classified elsewhere: Secondary | ICD-10-CM | POA: Insufficient documentation

## 2017-01-30 DIAGNOSIS — Z6841 Body Mass Index (BMI) 40.0 and over, adult: Secondary | ICD-10-CM | POA: Diagnosis not present

## 2017-01-30 DIAGNOSIS — I5033 Acute on chronic diastolic (congestive) heart failure: Secondary | ICD-10-CM | POA: Diagnosis present

## 2017-01-30 DIAGNOSIS — Z79899 Other long term (current) drug therapy: Secondary | ICD-10-CM | POA: Insufficient documentation

## 2017-01-30 DIAGNOSIS — R0609 Other forms of dyspnea: Secondary | ICD-10-CM

## 2017-01-30 DIAGNOSIS — R0600 Dyspnea, unspecified: Secondary | ICD-10-CM

## 2017-01-30 DIAGNOSIS — I110 Hypertensive heart disease with heart failure: Secondary | ICD-10-CM | POA: Diagnosis not present

## 2017-02-02 ENCOUNTER — Other Ambulatory Visit: Payer: Self-pay | Admitting: *Deleted

## 2017-02-02 DIAGNOSIS — J4550 Severe persistent asthma, uncomplicated: Secondary | ICD-10-CM | POA: Diagnosis not present

## 2017-02-02 DIAGNOSIS — Z6841 Body Mass Index (BMI) 40.0 and over, adult: Secondary | ICD-10-CM | POA: Diagnosis not present

## 2017-02-02 DIAGNOSIS — I5032 Chronic diastolic (congestive) heart failure: Secondary | ICD-10-CM | POA: Diagnosis not present

## 2017-02-02 MED ORDER — LOSARTAN POTASSIUM 50 MG PO TABS: 50.0000 mg | ORAL_TABLET | Freq: Every day | ORAL | 2 refills | Status: DC

## 2017-02-06 DIAGNOSIS — R269 Unspecified abnormalities of gait and mobility: Secondary | ICD-10-CM | POA: Diagnosis not present

## 2017-02-06 NOTE — Procedures (Signed)
   Patient Name: [name], [name] Date: 01/30/2017 Gender: Female D.O.B: [birth year] Age (years): 50 Referring Provider: Dorothy Spark Height (inches): 43 Interpreting Physician: Fransico Him MD, ABSM Weight (lbs): 290 RPSGT: Carolin Coy BMI: 48 MRN: [id number] Neck Size: 16.50  CLINICAL INFORMATION Sleep Study Type: NPSG  Indication for sleep study: Congestive Heart Failure, Excessive Daytime Sleepiness, Fatigue, Hypertension, Morning Headaches, Obesity, Snoring  Epworth Sleepiness Score: 24  SLEEP STUDY TECHNIQUE As per the AASM Manual for the Scoring of Sleep and Associated Events v2.3 (April 2016) with a hypopnea requiring 4% desaturations.  The channels recorded and monitored were frontal, central and occipital EEG, electrooculogram (EOG), submentalis EMG (chin), nasal and oral airflow, thoracic and abdominal wall motion, anterior tibialis EMG, snore microphone, electrocardiogram, and pulse oximetry.  MEDICATIONS Medications self-administered by patient taken the night of the study : TRAZODONE, HYDROXYZINE, CYCLOBENZAPAR  SLEEP ARCHITECTURE The study was initiated at 11:20:25 PM and ended at 5:24:52 AM.  Sleep onset time was 40.0 minutes and the sleep efficiency was 78.1%. The total sleep time was 284.5 minutes.  Stage REM latency was 33.5 minutes.  The patient spent 16.16% of the night in stage N1 sleep, 63.27% in stage N2 sleep, 0.00% in stage N3 and 20.56% in REM.  Alpha intrusion was absent.  Supine sleep was 5.83%.  RESPIRATORY PARAMETERS The overall apnea/hypopnea index (AHI) was 21.9 per hour. There were 5 total apneas, including 4 obstructive, 0 central and 1 mixed apneas. There were 99 hypopneas and 18 RERAs.  The AHI during Stage REM sleep was 56.4 per hour.  AHI while supine was 36.2 per hour.  The mean oxygen saturation was 94.33%. The minimum SpO2 during sleep was 79.00%.  Moderate snoring was noted during this study.  CARDIAC  DATA The 2 lead EKG demonstrated sinus rhythm. The mean heart rate was 83.43 beats per minute. Other EKG findings include: None.  LEG MOVEMENT DATA The total PLMS were 0 with a resulting PLMS index of 0.00. Associated arousal with leg movement index was 0.0 .  IMPRESSIONS - Moderate obstructive sleep apnea occurred during this study (AHI = 21.9/h). - No significant central sleep apnea occurred during this study (CAI = 0.0/h). - Moderate oxygen desaturation was noted during this study (Min O2 = 79.00%). - The patient snored with Moderate snoring volume. - No cardiac abnormalities were noted during this study. - Clinically significant periodic limb movements did not occur during sleep. No significant associated arousals.  DIAGNOSIS - Obstructive Sleep Apnea (327.23 [G47.33 ICD-10]) - Nocturnal Hypoxemia (327.26 [G47.36 ICD-10])  RECOMMENDATIONS - Therapeutic CPAP titration to determine optimal pressure required to alleviate sleep disordered breathing. - Positional therapy avoiding supine position during sleep. - Avoid alcohol, sedatives and other CNS depressants that may worsen sleep apnea and disrupt normal sleep architecture. - Sleep hygiene should be reviewed to assess factors that may improve sleep quality. - Weight management and regular exercise should be initiated or continued if appropriate.   [place], American Board of Sleep Medicine  ELECTRONICALLY SIGNED ON:  02/06/2017, 8:01 PM [place] PH: [phone]   FX: [phone] [place]

## 2017-02-09 DIAGNOSIS — Z1231 Encounter for screening mammogram for malignant neoplasm of breast: Secondary | ICD-10-CM | POA: Diagnosis not present

## 2017-02-13 ENCOUNTER — Telehealth: Payer: Self-pay | Admitting: *Deleted

## 2017-02-13 DIAGNOSIS — G4733 Obstructive sleep apnea (adult) (pediatric): Secondary | ICD-10-CM

## 2017-02-13 NOTE — Telephone Encounter (Signed)
-----   Message from Sueanne Margarita, MD sent at 02/06/2017  8:04 PM EDT ----- Please let patient know that they have sleep apnea and recommend CPAP titration. Please set up titration in the sleep lab.

## 2017-02-13 NOTE — Telephone Encounter (Addendum)
Informed patient of sleep study results and patient understanding was verbalized. Patient understands Dr Radford Pax recommends a CPAP Titiration to treat her OSA. Patient understands her Titration study is scheduled for Wednesday October 17. Patient understands her Titration study will be done at [facility] sleep lab. Patient understands she will receive a sleep packet in a week or so. Patient understands to call if she does not receive the sleep packet in a timely manner. Patient agrees with treatment and thanked me for call.

## 2017-02-14 NOTE — Addendum Note (Signed)
Addended by: Shela Leff on: 02/14/2017 07:31 PM   Modules accepted: Orders

## 2017-02-16 ENCOUNTER — Encounter: Payer: Self-pay | Admitting: *Deleted

## 2017-02-26 DIAGNOSIS — Z6841 Body Mass Index (BMI) 40.0 and over, adult: Secondary | ICD-10-CM | POA: Diagnosis not present

## 2017-02-26 DIAGNOSIS — I10 Essential (primary) hypertension: Secondary | ICD-10-CM | POA: Diagnosis not present

## 2017-02-28 DIAGNOSIS — R1314 Dysphagia, pharyngoesophageal phase: Secondary | ICD-10-CM | POA: Diagnosis not present

## 2017-03-02 DIAGNOSIS — H524 Presbyopia: Secondary | ICD-10-CM | POA: Diagnosis not present

## 2017-03-05 ENCOUNTER — Ambulatory Visit (INDEPENDENT_AMBULATORY_CARE_PROVIDER_SITE_OTHER): Payer: Medicare Other | Admitting: Internal Medicine

## 2017-03-05 DIAGNOSIS — Z8614 Personal history of Methicillin resistant Staphylococcus aureus infection: Secondary | ICD-10-CM | POA: Diagnosis not present

## 2017-03-05 DIAGNOSIS — I10 Essential (primary) hypertension: Secondary | ICD-10-CM | POA: Diagnosis not present

## 2017-03-05 DIAGNOSIS — E039 Hypothyroidism, unspecified: Secondary | ICD-10-CM

## 2017-03-05 DIAGNOSIS — H9203 Otalgia, bilateral: Secondary | ICD-10-CM | POA: Diagnosis not present

## 2017-03-05 DIAGNOSIS — I252 Old myocardial infarction: Secondary | ICD-10-CM | POA: Diagnosis not present

## 2017-03-05 NOTE — Progress Notes (Signed)
    CC: bilateral ear pain  HPI: [name] is a 51 y.o. woman with PMH noted below here for bilateral ear pain.   Please see Problem List/A&P for the status of the patient's chronic medical problems   Past Medical History:  Diagnosis Date  . Asthma   . Biliary dyskinesia    a. s/p cholecystectomy.  . 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 Abnl stress [place] 2008, f/u cath reportedly nl. b. ETT-Myoview 04/2011 - EKG changes but normal perfusion. Cor CT - no coronary calcium, no definite stenosis though mRCA not fully evaluated. c. 10/2011 - [place] elevated in [place], LHC without CAD. Started on Ranexa, anti-anginals ?microvascular dz but later stopped while in hospital on abx.  . Complication of anesthesia    hard to wake up-had to be reminded to breath  . GERD (gastroesophageal reflux disease)    a. Severe.  Marland Kitchen HTN (hypertension)   . Hx of cardiovascular stress test    Lex Myoview 8/14:  Normal, EF 74%  . Hx of echocardiogram    Echo 3/16:  Mild LVH, EF 55-60%, Gr 1 DD, trivial MR, mild LAE, normal RVF  . Hypothyroid   . MI (myocardial infarction) ([place])   . Migraine   . MRSA infection    a. After vagal nerve stimulator at [facility] - surgical site MRSA infection, PICC placed.  . Obesity   . Palpitations    a. 08/2014: 48 hour holter with 2 PVCs otherwise normal.  . Seizure disorder ([place])    a. since childhood. b. s/p vagal nerve stimulator at [facility].  . Seizures ([place])     Review of Systems: Denies fevers, chills, or constitutional symptoms Some headaches. No blurry vision. Some cough but it is chronic Some dizziness but she attributes to seizures No tinnitus. No trauma or discharge from ears  Gastrointestinal: Negative for heartburn, nausea, vomiting, abdominal pain, diarrhea   Physical Exam: Vitals:   03/05/17 1010  BP: (!) 143/84  Pulse: 92  Temp: 98.3 F (36.8 C)  TempSrc: Oral  SpO2: 98%  Weight: 290 lb (131.5 kg)  Height: 5' 5.5" (1.664  m)    General: A&O, in NAD, obese Neck: supple, midline trachea, no cervical lymphadenopathy  CV: RRR, normal s1, s2, no m/r/g, Resp: equal and symmetric breath sounds, no wheezing or crackles   HEENT: bilateral ear exam shows intact tympanic membrane. No purulence. No cerumen noted. Some tenderness around the left tragus , but not right tragus.   No clicking oN TMJ exam. No maxillary or frontal sinus tenderness.    Assessment & Plan:   See encounters tab for problem based medical decision making. Patient discussed with Dr. Angelia Mould

## 2017-03-05 NOTE — Assessment & Plan Note (Addendum)
Patient is here for bilateral ear pain for about 3 months, which started in July. She describes it more on the left ear, which can be throbbing. She denies any tinnitus. No ear trauma or discharge noted from both ears. She has associated headaches. Has some cough and SOb which she attributes to before the ear pain started due to her asthma and GERD. She has associated dizziness which she attributes to with her history of seizures which is longstanding. She also describes some hearing loss on the left ear.  Bilateral ear exam shows intact tympanic membrane. No purulence. No cerumen noted. Some tenderness around the left tragus , but not right tragus.   No clicking oN TMJ exam. No maxillary or frontal sinus tenderness.   A: unclear etiology- unlikely to be acute or chronic otisis media and externa, or association with TMJ  Plan -referral to ENT

## 2017-03-05 NOTE — Progress Notes (Signed)
Internal Medicine Clinic Attending  Case discussed with Dr. Saraiya at the time of the visit.  We reviewed the resident's history and exam and pertinent patient test results.  I agree with the assessment, diagnosis, and plan of care documented in the resident's note.  

## 2017-03-05 NOTE — Patient Instructions (Addendum)
Thank you for your visit today We have referred you to an ENT doctor-please make sure to follow up with them. Please continue to take flonase meanwhile. Please follow up on November 12th with Dr Shan Levans, who is your new PCP

## 2017-03-06 DIAGNOSIS — K219 Gastro-esophageal reflux disease without esophagitis: Secondary | ICD-10-CM | POA: Diagnosis not present

## 2017-03-06 DIAGNOSIS — K5904 Chronic idiopathic constipation: Secondary | ICD-10-CM | POA: Diagnosis not present

## 2017-03-09 ENCOUNTER — Telehealth: Payer: Self-pay | Admitting: Internal Medicine

## 2017-03-16 DIAGNOSIS — M26629 Arthralgia of temporomandibular joint, unspecified side: Secondary | ICD-10-CM | POA: Diagnosis not present

## 2017-03-16 DIAGNOSIS — H9192 Unspecified hearing loss, left ear: Secondary | ICD-10-CM | POA: Diagnosis not present

## 2017-03-21 ENCOUNTER — Ambulatory Visit (HOSPITAL_BASED_OUTPATIENT_CLINIC_OR_DEPARTMENT_OTHER): Payer: Medicare Other | Attending: Cardiology | Admitting: Cardiology

## 2017-03-21 ENCOUNTER — Encounter: Payer: Self-pay | Admitting: Cardiology

## 2017-03-21 DIAGNOSIS — G4733 Obstructive sleep apnea (adult) (pediatric): Secondary | ICD-10-CM | POA: Diagnosis not present

## 2017-03-22 NOTE — Procedures (Signed)
   Patient Name: [name], [name] Date: 03/21/2017 Gender: Female D.O.B: 1965/08/04 Age (years): 53 Referring Provider: Fransico Him MD, ABSM Height (inches): 66 Interpreting Physician: Fransico Him MD, ABSM Weight (lbs): 290 RPSGT: Jorge Ny BMI: 48 MRN: [id number] Neck Size: 16.50  CLINICAL INFORMATION The patient is referred for a CPAP titration to treat sleep apnea.  SLEEP STUDY TECHNIQUE As per the AASM Manual for the Scoring of Sleep and Associated Events v2.3 (April 2016) with a hypopnea requiring 4% desaturations.  The channels recorded and monitored were frontal, central and occipital EEG, electrooculogram (EOG), submentalis EMG (chin), nasal and oral airflow, thoracic and abdominal wall motion, anterior tibialis EMG, snore microphone, electrocardiogram, and pulse oximetry. Continuous positive airway pressure (CPAP) was initiated at the beginning of the study and titrated to treat sleep-disordered breathing.  MEDICATIONS Medications self-administered by patient taken the night of the study : TRAZODONE, HYDROXYZINE, CYCLOBENZAPAR  TECHNICIAN COMMENTS Comments added by technician: NONE  Comments added by scorer: N/A  RESPIRATORY PARAMETERS Optimal PAP Pressure (cm): 7cm  Overall Minimal O2 (%):90.00  Supine % at Optimal Pressure (%):N/A Minimal O2 at Optimal Pressure (%): 92.00    SLEEP ARCHITECTURE The study was initiated at 11:29:21 PM and ended at 5:48:42 AM.  Sleep onset time was 104.7 minutes and the sleep efficiency was 65.8%. The total sleep time was 249.5 minutes.  The patient spent 4.81% of the night in stage N1 sleep, 52.10% in stage N2 sleep, 0.00% in stage N3 and 43.09% in REM.Stage REM latency was 21.0 minutes  Wake after sleep onset was 25.2. Alpha intrusion was absent. Supine sleep was 16.17%.  CARDIAC DATA The 2 lead EKG demonstrated sinus rhythm. The mean heart rate was 93.20 beats per minute. Other EKG findings include: None.  LEG  MOVEMENT DATA The total Periodic Limb Movements of Sleep (PLMS) were 44. The PLMS index was 10.58. A PLMS index of <15 is considered normal in adults.  IMPRESSIONS - Successful CPAP titration to 7cm H2O. - Central sleep apnea was not noted during this titration (CAI = 0.0/h). - Mild oxygen desaturations were observed during this titration (min O2 = 90.00%). - The patient snored with soft snoring volume during this titration study. - No cardiac abnormalities were observed during this study. - Mild periodic limb movements were observed during this study. Arousals associated with PLMs were significant.  DIAGNOSIS - Obstructive Sleep Apnea (327.23 [G47.33 ICD-10])  RECOMMENDATIONS - Recommend CPAP at 7cm H2O with heated humidity and mask of choice. . - Avoid alcohol, sedatives and other CNS depressants that may worsen sleep apnea and disrupt normal sleep architecture. - Sleep hygiene should be reviewed to assess factors that may improve sleep quality. - Weight management and regular exercise should be initiated or continued. - Return to Sleep Center for re-evaluation after 10 weeks of therapy  [place], [place] of Sleep Medicine  ELECTRONICALLY SIGNED ON:  03/22/2017, 3:05 PM [place] PH: [phone]   FX: [phone] [place]

## 2017-03-22 NOTE — Progress Notes (Deleted)
   Patient Name: [name], St[place] Date: 03/21/2017 Gender: Female D.O.B: Feb 07, 1966 Age (years): 55 Referring Provider: Fransico Him MD, ABSM Height (inches): 66 Interpreting Physician: Fransico Him MD, ABSM Weight (lbs): 290 RPSGT: Jorge Ny BMI: 48 MRN: [id number] Neck Size: 16.50  CLINICAL INFORMATION The patient is referred for a CPAP titration to treat sleep apnea.  SLEEP STUDY TECHNIQUE As per the AASM Manual for the Scoring of Sleep and Associated Events v2.3 (April 2016) with a hypopnea requiring 4% desaturations.  The channels recorded and monitored were frontal, central and occipital EEG, electrooculogram (EOG), submentalis EMG (chin), nasal and oral airflow, thoracic and abdominal wall motion, anterior tibialis EMG, snore microphone, electrocardiogram, and pulse oximetry. Continuous positive airway pressure (CPAP) was initiated at the beginning of the study and titrated to treat sleep-disordered breathing.  MEDICATIONS Medications self-administered by patient taken the night of the study : TRAZODONE, HYDROXYZINE, CYCLOBENZAPAR  TECHNICIAN COMMENTS Comments added by technician: NONE  Comments added by scorer: N/A  RESPIRATORY PARAMETERS Optimal PAP Pressure (cm): 7cm  Overall Minimal O2 (%):90.00  Supine % at Optimal Pressure (%):N/A Minimal O2 at Optimal Pressure (%): 92.00    SLEEP ARCHITECTURE The study was initiated at 11:29:21 PM and ended at 5:48:42 AM.  Sleep onset time was 104.7 minutes and the sleep efficiency was 65.8%. The total sleep time was 249.5 minutes.  The patient spent 4.81% of the night in stage N1 sleep, 52.10% in stage N2 sleep, 0.00% in stage N3 and 43.09% in REM.Stage REM latency was 21.0 minutes  Wake after sleep onset was 25.2. Alpha intrusion was absent. Supine sleep was 16.17%.  CARDIAC DATA The 2 lead EKG demonstrated sinus rhythm. The mean heart rate was 93.20 beats per minute. Other EKG findings include: None.  LEG  MOVEMENT DATA The total Periodic Limb Movements of Sleep (PLMS) were 44. The PLMS index was 10.58. A PLMS index of <15 is considered normal in adults.  IMPRESSIONS - Successful CPAP titration to 7cm H2O. - Central sleep apnea was not noted during this titration (CAI = 0.0/h). - Mild oxygen desaturations were observed during this titration (min O2 = 90.00%). - The patient snored with soft snoring volume during this titration study. - No cardiac abnormalities were observed during this study. - Mild periodic limb movements were observed during this study. Arousals associated with PLMs were significant.  DIAGNOSIS - Obstructive Sleep Apnea (327.23 [G47.33 ICD-10])  RECOMMENDATIONS - Recommend CPAP at 7cm H2O with heated humidity and mask of choice. . - Avoid alcohol, sedatives and other CNS depressants that may worsen sleep apnea and disrupt normal sleep architecture. - Sleep hygiene should be reviewed to assess factors that may improve sleep quality. - Weight management and regular exercise should be initiated or continued. - Return to Sleep Center for re-evaluation after 10 weeks of therapy  [place], [place] of Sleep Medicine  ELECTRONICALLY SIGNED ON:  03/22/2017, 3:05 PM [place] PH: [phone]   FX: [phone] [place]

## 2017-03-26 DIAGNOSIS — E669 Obesity, unspecified: Secondary | ICD-10-CM | POA: Diagnosis not present

## 2017-03-27 ENCOUNTER — Telehealth: Payer: Self-pay | Admitting: *Deleted

## 2017-03-27 NOTE — Telephone Encounter (Signed)
Informed patient of titration results and verbalized understanding was indicated. Patient understands she had a successful CPAP titration and Dr Radford Pax has ordered her a new CPAP in EPIC. Patient understands she will be contacted by [facility] to set up her cpap. She understands to call if [facility] does not contact her with new setup in a timely manner. She understands she will be called once confirmation has been received from [facility] that she has received her new machine to schedule 10 week follow up appointment.  [place] notified of new cpap order in epic Please add to [name] She was grateful for the call and thanked me

## 2017-03-27 NOTE — Telephone Encounter (Signed)
-----   Message from Sueanne Margarita, MD sent at 03/22/2017  3:08 PM EDT ----- Please let patient know that they had a successful PAP titration and let DME know that orders are in EPIC.  Please set up 10 week OV with me.

## 2017-03-30 DIAGNOSIS — M545 Low back pain: Secondary | ICD-10-CM | POA: Diagnosis not present

## 2017-03-30 DIAGNOSIS — I2510 Atherosclerotic heart disease of native coronary artery without angina pectoris: Secondary | ICD-10-CM | POA: Diagnosis not present

## 2017-03-30 DIAGNOSIS — I110 Hypertensive heart disease with heart failure: Secondary | ICD-10-CM | POA: Diagnosis not present

## 2017-03-30 DIAGNOSIS — I5032 Chronic diastolic (congestive) heart failure: Secondary | ICD-10-CM | POA: Diagnosis not present

## 2017-04-02 NOTE — Telephone Encounter (Signed)
  Macky Lower, CMA        This has been scheduled for 11/9 at 1pm. Tyna Jaksch

## 2017-04-04 ENCOUNTER — Ambulatory Visit: Payer: Medicare Other | Admitting: Cardiology

## 2017-04-05 ENCOUNTER — Telehealth: Payer: Self-pay | Admitting: *Deleted

## 2017-04-05 ENCOUNTER — Ambulatory Visit: Payer: Medicare Other | Admitting: Neurology

## 2017-04-05 DIAGNOSIS — F4323 Adjustment disorder with mixed anxiety and depressed mood: Secondary | ICD-10-CM | POA: Diagnosis not present

## 2017-04-05 NOTE — Telephone Encounter (Signed)
No showed new patient appointment. 

## 2017-04-06 ENCOUNTER — Encounter: Payer: Self-pay | Admitting: Neurology

## 2017-04-06 DIAGNOSIS — E669 Obesity, unspecified: Secondary | ICD-10-CM | POA: Diagnosis not present

## 2017-04-10 DIAGNOSIS — E669 Obesity, unspecified: Secondary | ICD-10-CM | POA: Diagnosis not present

## 2017-04-13 DIAGNOSIS — R0789 Other chest pain: Secondary | ICD-10-CM | POA: Diagnosis not present

## 2017-04-13 DIAGNOSIS — R112 Nausea with vomiting, unspecified: Secondary | ICD-10-CM | POA: Diagnosis not present

## 2017-04-13 DIAGNOSIS — R1312 Dysphagia, oropharyngeal phase: Secondary | ICD-10-CM | POA: Diagnosis not present

## 2017-04-13 DIAGNOSIS — R12 Heartburn: Secondary | ICD-10-CM | POA: Diagnosis not present

## 2017-04-16 ENCOUNTER — Encounter: Payer: Self-pay | Admitting: Internal Medicine

## 2017-04-16 ENCOUNTER — Encounter: Payer: Medicare Other | Admitting: Internal Medicine

## 2017-04-20 ENCOUNTER — Ambulatory Visit: Payer: Medicare Other | Admitting: Internal Medicine

## 2017-04-20 ENCOUNTER — Encounter: Payer: Self-pay | Admitting: Internal Medicine

## 2017-04-20 ENCOUNTER — Other Ambulatory Visit: Payer: Self-pay

## 2017-04-20 DIAGNOSIS — M5441 Lumbago with sciatica, right side: Secondary | ICD-10-CM

## 2017-04-20 DIAGNOSIS — M5442 Lumbago with sciatica, left side: Secondary | ICD-10-CM

## 2017-04-20 DIAGNOSIS — Z4802 Encounter for removal of sutures: Secondary | ICD-10-CM

## 2017-04-20 DIAGNOSIS — M5127 Other intervertebral disc displacement, lumbosacral region: Secondary | ICD-10-CM

## 2017-04-20 NOTE — Assessment & Plan Note (Signed)
Patient with complaints of lower back pain today. She reports that she has had lower back pain for years after having an accident in 2000. Reports that the pain is constant and has significantly worsened over the past 1-2 months. She reports that she is having new shooting pains down her bilateral proximal LE with associated numbness. Reports that she is having weakness in her bilateral legs and difficulties with ambulation and rising from a seated position 2/2. Reports she has had urinary incontinence for years. No bowel incontinence. No saddle paresthesias.   CT Lumbar Spine from 06/2016 report below.   Disc levels: Disc heights are maintained. At L1-2 there is mild bilateral facet arthropathy. At L2-3 there is a mild broad-based disc bulge and mild bilateral facet arthropathy. At L3-4 there is mild broad-based disc bulge with moderate bilateral facet arthropathy. At L4-5 there is moderate bilateral facet arthropathy and a mild broad-based disc bulge. At L5-S1 there is mild bilateral facet arthropathy. There is no significant foraminal stenosis.  She is interested in steroid injections for the pain.  A/P Given her new symptoms will try to arrange MRI lumbar spine and arrange for spinal injections with IR pending MRI results.

## 2017-04-20 NOTE — Assessment & Plan Note (Signed)
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 Requesting to have her stiches removed today. Axilla with 4 sutures in place. Incision is clean and well healing without signs of infection.   4 sutures removed today without complications.

## 2017-04-20 NOTE — Patient Instructions (Signed)
[name],  I would like to get an MRI of your back and we will work on getting your set up for injections into your back.  Please follow up in 1 month.

## 2017-04-20 NOTE — Progress Notes (Signed)
   CC: suture removal  HPI:  [name] is a 51 y.o. female with a past medical history listed below here today for suture removal with complaints of acute on chronic low back pain.   For details of today's visit and the status of her chronic medical issues please refer to the assessment and plan.   Past Medical History:  Diagnosis Date  . Asthma   . Biliary dyskinesia    a. s/p cholecystectomy.  . 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 Abnl stress [place] 2008, f/u cath reportedly nl. b. ETT-Myoview 04/2011 - EKG changes but normal perfusion. Cor CT - no coronary calcium, no definite stenosis though mRCA not fully evaluated. c. 10/2011 - tn elevated in [place], LHC without CAD. Started on Ranexa, anti-anginals ?microvascular dz but later stopped while in hospital on abx.  . Complication of anesthesia    hard to wake up-had to be reminded to breath  . GERD (gastroesophageal reflux disease)    a. Severe.  Marland Kitchen HTN (hypertension)   . Hx of cardiovascular stress test    Lex Myoview 8/14:  Normal, EF 74%  . Hx of echocardiogram    Echo 3/16:  Mild LVH, EF 55-60%, Gr 1 DD, trivial MR, mild LAE, normal RVF  . Hypothyroid   . MI (myocardial infarction) ([place])   . Migraine   . MRSA infection    a. After vagal nerve stimulator at [facility] - surgical site MRSA infection, PICC placed.  . Obesity   . Palpitations    a. 08/2014: 48 hour holter with 2 PVCs otherwise normal.  . Seizure disorder ([place])    a. since childhood. b. s/p vagal nerve stimulator at [facility].  . Seizures ([place])    Review of Systems:   No chest pain or shortness of breath  Physical Exam:  Vitals:   04/20/17 1037  BP: 120/75  Pulse: 82  Temp: 97.7 F (36.5 C)  TempSrc: Oral  SpO2: 99%  Weight: 297 lb 12.8 oz (135.1 kg)  Height: 5' 5.5" (1.664 m)   Physical Exam  Constitutional: She is well-developed, well-nourished, and in no distress. No distress.  HENT:  Head: Normocephalic and  atraumatic.  Cardiovascular: Normal rate and regular rhythm.  Pulmonary/Chest: Effort normal and breath sounds normal.  Abdominal: Soft. Bowel sounds are normal.  Neurological:  Reports decreased sensation over posterior aspect of bilateral lower extremities above the knee; otherwise intact sensation. 3+/5 strength in bilateral proximal LE though limited due to effort. Strength 5/5 throughout otherwise. Unable to elicit reflexes, limited due to body habitus.   Skin: Skin is warm and dry. No rash noted.  Right axilla with 4 sutures in place  Psychiatric: Mood and affect normal.   Assessment & Plan:   See Encounters Tab for problem based charting.  Patient discussed with Dr. Angelia Mould

## 2017-04-24 NOTE — Progress Notes (Signed)
Internal Medicine Clinic Attending  Case discussed with Dr. Boswell at the time of the visit.  We reviewed the resident's history and exam and pertinent patient test results.  I agree with the assessment, diagnosis, and plan of care documented in the resident's note.  

## 2017-04-25 DIAGNOSIS — K219 Gastro-esophageal reflux disease without esophagitis: Secondary | ICD-10-CM | POA: Diagnosis not present

## 2017-04-25 DIAGNOSIS — R110 Nausea: Secondary | ICD-10-CM | POA: Diagnosis not present

## 2017-04-25 DIAGNOSIS — R1310 Dysphagia, unspecified: Secondary | ICD-10-CM | POA: Diagnosis not present

## 2017-04-30 DIAGNOSIS — R269 Unspecified abnormalities of gait and mobility: Secondary | ICD-10-CM | POA: Diagnosis not present

## 2017-04-30 DIAGNOSIS — G4733 Obstructive sleep apnea (adult) (pediatric): Secondary | ICD-10-CM | POA: Diagnosis not present

## 2017-05-01 ENCOUNTER — Telehealth: Payer: Self-pay | Admitting: Internal Medicine

## 2017-05-01 DIAGNOSIS — R110 Nausea: Secondary | ICD-10-CM | POA: Diagnosis not present

## 2017-05-01 DIAGNOSIS — K219 Gastro-esophageal reflux disease without esophagitis: Secondary | ICD-10-CM | POA: Diagnosis not present

## 2017-05-01 DIAGNOSIS — R1310 Dysphagia, unspecified: Secondary | ICD-10-CM | POA: Diagnosis not present

## 2017-05-01 NOTE — Telephone Encounter (Signed)
Marilynne Drivers, CMA        This patient was a no show for CPAP set up today.   Thanks  Melissa     Reached out to the patient to see what happened that she missed her appointment. Lmtcb and left the number to [facility] [phone] ext. [phone] to reschedule her appointment.

## 2017-05-02 NOTE — Telephone Encounter (Signed)
I think that will be fine.  Looks like she no showed for her appointment this month.  She sees a different specialist each month leading up to April of next year so I can read their notes, look at the whole picture and see her then.  She can always make an appointment with [facility] if she needs to in the mean time.

## 2017-05-02 NOTE — Telephone Encounter (Signed)
Dr. Shan Levans will not have any openings until April 2019.  Will the patient need to come back sooner in [facility]?  Please advise.

## 2017-05-07 DIAGNOSIS — R269 Unspecified abnormalities of gait and mobility: Secondary | ICD-10-CM | POA: Diagnosis not present

## 2017-05-07 DIAGNOSIS — G4733 Obstructive sleep apnea (adult) (pediatric): Secondary | ICD-10-CM | POA: Diagnosis not present

## 2017-05-11 NOTE — Telephone Encounter (Signed)
Entered in error

## 2017-05-21 ENCOUNTER — Encounter: Payer: Self-pay | Admitting: Cardiology

## 2017-05-21 ENCOUNTER — Ambulatory Visit (INDEPENDENT_AMBULATORY_CARE_PROVIDER_SITE_OTHER): Payer: Medicare Other | Admitting: Cardiology

## 2017-05-21 VITALS — BP 132/80 | HR 91 | Ht 65.5 in | Wt 304.0 lb

## 2017-05-21 DIAGNOSIS — E039 Hypothyroidism, unspecified: Secondary | ICD-10-CM

## 2017-05-21 DIAGNOSIS — I110 Hypertensive heart disease with heart failure: Secondary | ICD-10-CM

## 2017-05-21 DIAGNOSIS — I5033 Acute on chronic diastolic (congestive) heart failure: Secondary | ICD-10-CM | POA: Diagnosis not present

## 2017-05-21 DIAGNOSIS — I5032 Chronic diastolic (congestive) heart failure: Secondary | ICD-10-CM

## 2017-05-21 DIAGNOSIS — Z6841 Body Mass Index (BMI) 40.0 and over, adult: Secondary | ICD-10-CM | POA: Diagnosis not present

## 2017-05-21 LAB — COMPREHENSIVE METABOLIC PANEL
ALT: 29 IU/L (ref 0–32)
AST: 17 IU/L (ref 0–40)
Albumin/Globulin Ratio: 1.4 (ref 1.2–2.2)
Albumin: 4.2 g/dL (ref 3.5–5.5)
Alkaline Phosphatase: 86 IU/L (ref 39–117)
BUN/Creatinine Ratio: 18 (ref 9–23)
BUN: 14 mg/dL (ref 6–24)
Bilirubin Total: 0.3 mg/dL (ref 0.0–1.2)
CO2: 24 mmol/L (ref 20–29)
Calcium: 9.5 mg/dL (ref 8.7–10.2)
Chloride: 105 mmol/L (ref 96–106)
Creatinine, Ser: 0.8 mg/dL (ref 0.57–1.00)
GFR calc Af Amer: 97 mL/min/{1.73_m2} (ref >59)
GFR calc non Af Amer: 84 mL/min/{1.73_m2} (ref >59)
Globulin, Total: 3 g/dL (ref 1.5–4.5)
Glucose: 101 mg/dL — ABNORMAL HIGH (ref 65–99)
Potassium: 4.8 mmol/L (ref 3.5–5.2)
Sodium: 142 mmol/L (ref 134–144)
Total Protein: 7.2 g/dL (ref 6.0–8.5)

## 2017-05-21 LAB — CBC WITH DIFFERENTIAL/PLATELET
Basophils Absolute: 0 10*3/uL (ref 0.0–0.2)
Basos: 0 %
EOS (ABSOLUTE): 0.2 10*3/uL (ref 0.0–0.4)
Eos: 4 %
Hematocrit: 38.8 % (ref 34.0–46.6)
Hemoglobin: 12.8 g/dL (ref 11.1–15.9)
Immature Grans (Abs): 0.1 10*3/uL (ref 0.0–0.1)
Immature Granulocytes: 1 %
Lymphocytes Absolute: 2.4 10*3/uL (ref 0.7–3.1)
Lymphs: 38 %
MCH: 26.6 pg (ref 26.6–33.0)
MCHC: 33 g/dL (ref 31.5–35.7)
MCV: 81 fL (ref 79–97)
Monocytes Absolute: 0.5 10*3/uL (ref 0.1–0.9)
Monocytes: 7 %
Neutrophils Absolute: 3.2 10*3/uL (ref 1.4–7.0)
Neutrophils: 50 %
Platelets: 235 10*3/uL (ref 150–379)
RBC: 4.81 x10E6/uL (ref 3.77–5.28)
RDW: 15.1 % (ref 12.3–15.4)
WBC: 6.3 10*3/uL (ref 3.4–10.8)

## 2017-05-21 LAB — LIPID PANEL
Chol/HDL Ratio: 3.4 ratio (ref 0.0–4.4)
Cholesterol, Total: 200 mg/dL — ABNORMAL HIGH (ref 100–199)
HDL: 59 mg/dL (ref >39)
LDL Calculated: 129 mg/dL — ABNORMAL HIGH (ref 0–99)
Triglycerides: 58 mg/dL (ref 0–149)
VLDL Cholesterol Cal: 12 mg/dL (ref 5–40)

## 2017-05-21 LAB — T3, FREE: T3, Free: 2.6 pg/mL (ref 2.0–4.4)

## 2017-05-21 LAB — TSH: TSH: 4.01 u[IU]/mL (ref 0.450–4.500)

## 2017-05-21 LAB — T4, FREE: Free T4: 0.88 ng/dL (ref 0.82–1.77)

## 2017-05-21 NOTE — Patient Instructions (Signed)
Medication Instructions:   Your physician recommends that you continue on your current medications as directed. Please refer to the Current Medication list given to you today.   Labwork:   TODAY---CMET, CBC W DIFF, TSH, LIPIDS, FREE T3 AND FREE T4     Follow-Up:  Your physician wants you to follow-up in: [place] will receive a reminder letter in the mail two months in advance. If you don't receive a letter, please call our office to schedule the follow-up appointment.        If you need a refill on your cardiac medications before your next appointment, please call your pharmacy.

## 2017-05-21 NOTE — Progress Notes (Addendum)
Cardiology Office Note:    Date:  05/21/2017   ID:  [name], DOB [birth year], MRN [id number]  PCP:  Katherine Roan, MD  Cardiologist:  Dr. Loralie Champagne >> Dr. Ena Dawley   Electrophysiologist:  n/a GI: Dr. Collene Mares [facility]) Pulmonology: Dr. Soledad Gerlach [facility])  Referring MD: Mackie Pai, MD   No chief complaint on file.   History of Present Illness:    [name] is a 51 y.o. female with a complex hx of chest pain and shortness of breath.  Her hx includes prior negative caths as well as migraines, asthma, seizure d/o s/p vagal nerve stimulator and HTN. She has had chest pain chronically over a number of years. She had an abnormal stress test in [place] in 2008. She was then sent for a cardiac cath, which was reportedly normal. We had her do an ETT-myoview in this office in 11/12. This showed ischemic-type ST depression on exercise ECG, but there was no evidence for ischemia on myoview images. EF was 66%. Given ongoing chest pain and ischemic ECG changes with normal perfusion images (equivocal study), she underwent a coronary CT angiogram. This was a difficult study because we were not able to get her HR as low as would be ideal. However, she had no coronary calcium and no definite stenosis was seen (though mid-RCA was not fully evaluated due to artifact). Dr. [name] felt that this was a negative study. She continued to have chest tightness with walking long distances, climbing a flight of steps, and housework. In 5/13, she went to the ER in [place] with particularly bad exertional chest pain that lasted a prolonged time. Troponin was mildly elevated with normal CKMB. She had a left heart cath in the hospital in [place] which showed no angiographic CAD. Echo showed normal EF. 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 She stopped amlodipine due to ankle swelling.  She is followed by [facility] for Asthma. Vagal  nerve stimulator implantation in 3532 was complicated by MRSA infection.  Echocardiogram in 10/17 demonstrated normal LV function with moderate diastolic dysfunction. Myoview in 10/17 demonstrated no ischemia. Right heart catheterization in 12/17 demonstrated normal right and left heart filling pressures and no evidence of pulmonary hypertension. She was admitted in 3/18 with chest pain. Cardiac enzymes remained negative. No further ischemic evaluation was recommended.  She established with Dr. Meda Coffee in 3/18. There was some concern for volume excess. Her Lasix was adjusted.  She was seen again in the emergency room 09/02/16. She complained of chest pain. There was some vascular congestion on chest x-ray. She was given IV Lasix x 1. She was also hypokalemic and this was replaced. She had improvement in her symptoms with GI cocktail. As needed nitroglycerin was recommended for probable esophageal spasm.  She is here alone.  She tells me that the IV Lasix provided her more relief than the GI cocktail.  She has chronic exertional chest pain that is relieved with NTG.  Her breathing is improved since she went to the ED.  However, she continues to have dyspnea on exertion with mild to moderate activity.  She sleeps on 3 pillows and notes occasional PND.  She notes some hand and ankle edema today.  She denies syncope.   11/29/2016 - 2 months follow-up, the patient underwent right heart catheterization that didn't show any pulmonary hypertension and normal intracardiac pressures. She continues to feel significant shortness of breath especially when  walking upstairs associated with chest tightness, sublingual nitroglycerin helps with that pain. She also states that she snores at night and never had sleep study done before. She is considering gastric bypass surgery and is looking for Center for evaluation. Intermittent lower extremity edema that is relieved by Lasix. No orthopnea or paroxysmal nocturnal dyspnea.  Compliant with her meds.  05/21/2017 - this is 6 months follow-up, the patient states that she was recently evaluated for sleep apnea and started on CPAP machine, she states that her shortness of breath has improved since then. Her weight has increased however she hasn't noticed any lower extremity edema, she will have some edema occasionally for which she takes extra Lasix and it resolves. She denies any chest pain no orthopnea or paroxysmal nocturnal dyspnea. She has been compliant with her medications, she struggles with weight loss, she has been compliant with low-sodium diet however struggles with diet overall, lot of snacks and sodas.  Prior CV studies:   The following studies were reviewed today:  RHC 06/01/16 RA mean 7 RV 35/8 PA 31/12, mean 20 PCWP mean 10 Oxygen saturations: PA 66% AO 98% Cardiac Output (Fick) 5.88  Cardiac Index (Fick) 2.59 Normal right and left heart filling pressures, no pulmonary hypertension.  Normal cardiac output.   Myoview 04/04/16 EF 49, no ischemia, low risk  Echo 04/04/16 Mild concentric LVH, EF 60-65, normal wall motion, grade 2 diastolic dysfunction  48 Hr Holter 08/2014 No arrhythmia; rare PVC/PACs  Echo 08/28/14 Mild LVH, EF 55-60, Gr 1 DD, trivial MR, mild LAE  Myoview 02/17/14 [facility]) Lexiscan - No ischemia, EF 70%  Echo 09/30/12 Mild LVH. EF 55-65%. No RWMA, Gr 2 DD, trivial AI   Past Medical History:  Diagnosis Date  . Asthma   . Biliary dyskinesia    a. s/p cholecystectomy.  . 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 Abnl stress [place] 2008, f/u cath reportedly nl. b. ETT-Myoview 04/2011 - EKG changes but normal perfusion. Cor CT - no coronary calcium, no definite stenosis though mRCA not fully evaluated. c. 10/2011 - tn elevated in [place], LHC without CAD. Started on Ranexa, anti-anginals ?microvascular dz but later stopped while in hospital on abx.  . Complication of anesthesia    hard to wake up-had to be  reminded to breath  . GERD (gastroesophageal reflux disease)    a. Severe.  Marland Kitchen HTN (hypertension)   . Hx of cardiovascular stress test    Lex Myoview 8/14:  Normal, EF 74%  . Hx of echocardiogram    Echo 3/16:  Mild LVH, EF 55-60%, Gr 1 DD, trivial MR, mild LAE, normal RVF  . Hypothyroid   . MI (myocardial infarction) ([place])   . Migraine   . MRSA infection    a. After vagal nerve stimulator at [facility] - surgical site MRSA infection, PICC placed.  . Obesity   . Palpitations    a. 08/2014: 48 hour holter with 2 PVCs otherwise normal.  . Seizure disorder ([place])    a. since childhood. b. s/p vagal nerve stimulator at [facility].  . Seizures ([place])    1. Chest pain: Multiple episodes over the years. She had an abnormal stress test in 2008 in [place] and it sounds like she went to [facility] for a cardiac catheterization which per her report was normal. Stress echo at [facility] in 2010 was submaximal but showed no evidence for ischemia. ETT-myoview (11/12): 7'15", EF 66%, significant ST depression on exercise ECG,  apical thinning on myoview images but no evidence for ischemia. Coronary CT angiogram (11/12): calcium score 0, difficult study with gating artifact, mid RCA nonevaluatable but no stenosis elsewhere in the coronaries. Echo (11/12): EF 55-60%, mild LV hypertrophy, mild MR. NSTEMI in [place] in 5/13 with elevated troponin but LHC showed normal coronaries. Echo (5/13) with EF 60-65%, normal valve, no LVH. 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 Echo 09/30/12: Mild LVH, EF 55-65%, and GR 2 DD, trivial AI. 2. Migraines: Uses triptan medications.  3. HTN  4. Seizure disorder: Has vagus nerve stimulator.  5. Hypothyroidism  6. Asthma since childhood  Past Surgical History:  Procedure Laterality Date  . [place] N/A 06/01/2016   Procedure: Right Heart Cath;  Surgeon: Larey Dresser, MD;  Location: [facility];  Service: Cardiovascular;  Laterality: N/A;  . CESAREAN SECTION     placement of vagal nerve stimulator.  . CHOLECYSTECTOMY N/A 01/24/2013   Procedure: LAPAROSCOPIC CHOLECYSTECTOMY;  Surgeon: Harl Bowie, MD;  Location: [facility];  Service: General;  Laterality: N/A;  . IMPLANTATION VAGAL NERVE STIMULATOR  [phone]   battery [facility]    Current Medications: Current Meds  Medication Sig  . acetaminophen (TYLENOL) 325 MG tablet Take 975 mg by mouth every 6 (six) hours as needed for headache.  . albuterol (PROVENTIL HFA;VENTOLIN HFA) 108 (90 Base) MCG/ACT inhaler Inhale 2 puffs into the lungs every 6 (six) hours as needed for wheezing or shortness of breath.  . carvedilol (COREG) 25 MG tablet Take 1 tablet (25 mg total) by mouth 2 (two) times daily with a meal.  . cyclobenzaprine (FLEXERIL) 5 MG tablet Take 5 mg by mouth 2 (two) times daily.  . fluticasone (FLONASE) 50 MCG/ACT nasal spray Place 1 spray into both nostrils 2 (two) times daily as needed for allergies.   Marland Kitchen gabapentin (NEURONTIN) 300 MG capsule TAKE 1 CAPSULE (300 MG TOTAL) BY MOUTH AT BEDTIME.  . isosorbide mononitrate (IMDUR) 60 MG 24 hr tablet TAKE 1 TABLET (60 MG TOTAL) BY MOUTH 2 (TWO) TIMES DAILY.  Marland Kitchen levothyroxine (SYNTHROID, LEVOTHROID) 50 MCG tablet Take 50 mcg by mouth daily.   . mometasone-formoterol (DULERA) 100-5 MCG/ACT AERO Inhale 2 puffs into the lungs 2 (two) times daily.  . montelukast (SINGULAIR) 10 MG tablet Take 10 mg by mouth daily as needed (for allergies).   . nitroGLYCERIN (NITRODUR - DOSED IN MG/24 HR) 0.2 mg/hr patch PLACE 1 PATCH ONTO THE SKIN DAILY.  . nitroGLYCERIN (NITROSTAT) 0.4 MG SL tablet Place 1 tablet (0.4 mg total) under the tongue every 5 (five) minutes as needed for chest pain.  . pantoprazole (PROTONIX) 40 MG tablet Take 1 tablet (40 mg total) by mouth 2 (two) times daily.  . promethazine (PHENERGAN) 25 MG tablet Take 25 mg by mouth every 4 (four)  hours as needed for nausea or vomiting.   . topiramate (TOPAMAX) 100 MG tablet Take 200 mg by mouth 2 (two) times daily.  . traZODone (DESYREL) 100 MG tablet Take 100 mg by mouth daily.  . Venlafaxine HCl 150 MG TB24 Take 150 mg by mouth daily.  . verapamil (CALAN-SR) 240 MG CR tablet Take 1 tablet (240 mg total) by mouth at bedtime.     Allergies:   Amitriptyline; Amlodipine; Latex; Amantadines; Aspirin; Simvastatin; and Tape   Social History   Socioeconomic History  . Marital status: Single    Spouse name: None  .  Number of children: 2  . Years of education: None  . Highest education level: None  Social Needs  . Financial resource strain: None  . Food insecurity - worry: None  . Food insecurity - inability: None  . Transportation needs - medical: None  . Transportation needs - non-medical: None  Occupational History  . None  Tobacco Use  . Smoking status: Never Smoker  . Smokeless tobacco: Never Used  Substance and Sexual Activity  . Alcohol use: Yes    Alcohol/week: 0.0 oz    Types: 1 - 3 Glasses of wine per week    Comment: occasional  . Drug use: No  . Sexual activity: Yes    Birth control/protection: None  Other Topics Concern  . None  Social History Narrative   Lives alone     Family History  Problem Relation Age of Onset  . Coronary artery disease Mother 58  . Coronary artery disease Maternal Grandmother   . Cancer Other   . Hypertension Other   . Stroke Other      ROS:   Please see the history of present illness.    Review of Systems  Constitution: Positive for diaphoresis.  Cardiovascular: Positive for chest pain, dyspnea on exertion and leg swelling.  Respiratory: Positive for shortness of breath.   Musculoskeletal: Positive for joint pain and joint swelling.  Gastrointestinal: Positive for nausea.  Neurological: Positive for dizziness and headaches.  Psychiatric/Behavioral: The patient is nervous/anxious.    All other systems reviewed and are  negative.   EKGs/Labs/Other Test Reviewed:    EKG:  EKG is  ordered today.  The ekg ordered today demonstrates NSR, HR 84, normal axis, T-wave inversion 2, 3, aVF, V4-V6, QTc 439 ms, no change since prior tracings  Recent Labs: 08/04/2016: B Natriuretic Peptide 22.3; TSH 3.383 09/02/2016: ALT 21; Hemoglobin 11.4; Platelets 210 11/29/2016: BUN 13; Creatinine, Ser 0.78; NT-Pro BNP 9; Potassium 4.6; Sodium 140   Recent Lipid Panel    Component Value Date/Time   CHOL 166 09/30/2012 0929   TRIG 56.0 09/30/2012 0929   HDL 51.30 09/30/2012 0929   CHOLHDL 3 09/30/2012 0929   VLDL 11.2 09/30/2012 0929   LDLCALC 104 (H) 09/30/2012 0929     Physical Exam:    VS:  BP 132/80   Pulse 91   Ht 5' 5.5" (1.664 m)   Wt (!) 304 lb (137.9 kg)   SpO2 96%   BMI 49.82 kg/m     Wt Readings from Last 3 Encounters:  05/21/17 (!) 304 lb (137.9 kg)  04/20/17 297 lb 12.8 oz (135.1 kg)  03/05/17 290 lb (131.5 kg)     Physical Exam  Constitutional: She is oriented to person, place, and time. She appears well-developed and well-nourished. No distress.  HENT:  Head: Normocephalic and atraumatic.  Eyes: No scleral icterus.  Neck: Normal range of motion. No JVD (JVD is difficult to assess)) present.  Cardiovascular: Normal rate, regular rhythm, S1 normal, S2 normal and normal heart sounds.  No murmur heard. Pulmonary/Chest: She has decreased breath sounds. She has no wheezes. She has no rhonchi. She has no rales.  Abdominal: Soft. There is no tenderness.  Musculoskeletal: She exhibits no edema.  Neurological: She is alert and oriented to person, place, and time.  Skin: Skin is warm and dry.  Acanthosis nigricans about the base of her neck  Psychiatric: She has a normal mood and affect.    ASSESSMENT:    1. Hypertensive heart disease with  chronic diastolic congestive heart failure ([place])   2. Acute on chronic diastolic CHF (congestive heart failure) ([place])   3. Morbid obesity ([place])   4.  Hypothyroidism, unspecified type    PLAN:    In order of problems listed above:  1. Acute on chronic diastolic heart failure (HCC) -  Grade 2 diastolic dysfunction on echocardiogram and normal LVEF, she is taking Lasix as needed, she appears euvolemic despite increased weight but doesn't seem to be related to fluid overload.  She is advised about low sodium diet. She is advised to start walking daily at least a mile in 5000 steps a day. Also continues spironolactone.   -  Check BMP and BNP today.  2. 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  She has exertional chest pain that is NTG responsive.  She is asymptomatic.  -  Continue isosorbide, nitroglycerin patch, verapamil, when necessary nitroglycerin  3. Hypertensive heart disease with chronic diastolic congestive heart failure (HCC) -  Blood pressure controlled. Continue the same regimen.  4. Morbid obesity ([place]) - - Had a long conversation about diet, avoiding snack, sodas and having always help his legs such as vegetables apples on hand. This also advised about necessity of regular exercise, she has recently joined [employer]. She is advised to go to bariatric center and [facility] for consultation.   5. Dyspnea on exertion - out of proportion for the level of CHF, this has improved with CPAP machine.   6. Hypothyroidism - the patient thinks that her weight may be related to poorly controlled hypothyroidism, check TSH free T3 and 4 today.  Dispo:  No Follow-up on file.    Medication Adjustments/Labs and Tests Ordered: Current medicines are reviewed at length with the patient today.  Concerns regarding medicines are outlined above.  Medication changes, Labs and Tests are noted below.  Please see Patient Instructions for other items discussed this visit.  Orders Placed This Encounter  Procedures  . Comp Met (CMET)  . CBC w/Diff  . TSH  . Lipid Profile  . T3, free  . T4, free    Signed, Ena Dawley, MD  05/21/2017 11:12 AM    [address], [place], [place]  [address] Phone: [phone]; Fax: [phone]

## 2017-05-22 ENCOUNTER — Telehealth: Payer: Self-pay | Admitting: Internal Medicine

## 2017-05-22 ENCOUNTER — Ambulatory Visit: Payer: Medicare Other

## 2017-05-22 NOTE — Telephone Encounter (Signed)
The Order you placed for this patient is an MRI and the patient called and stated she is scheduled for her MRI on 05/30/2017 is concerned.  Patient states she does have an implanted device in her neck called a "Vagal Nerve Stimulator".  Pt would like to know if it is safe to have her MRI performed?

## 2017-05-22 NOTE — Telephone Encounter (Signed)
I did not actually, order the MRI, I'm not convinced she needs it based on the clinic note.  However, I can look into it further.  Does the patient happen to know the brand and model number of her device.  I'm certainly no expert about these devices and whether they're MRI compatible.  Probably the best thing to do is get that information first and see if she can call the imaging center where she's getting it done at and see if it's okay.

## 2017-05-23 ENCOUNTER — Telehealth: Payer: Self-pay

## 2017-05-23 NOTE — Telephone Encounter (Signed)
   [facility] Pre-operative Risk Assessment    Request for surgical clearance:  1. What type of surgery is being performed? Dr. Osvaldo Human    2. When is this surgery scheduled? Bariatric surgery   3. Are there any medications that need to be held prior to surgery and how long? Anticoagulant/antiplatelet instructions prior to bariatric surgery  4. Practice name and name of physician performing surgery?  1. [facility]  2. Dr. Erven Colla    5. What is your office phone and fax number?  1. Phone [phone] 2. Fax [phone]  6. Anesthesia type (None, local, MAC, general) ? General    _________________________________________________________________   (provider comments below)

## 2017-05-24 NOTE — Telephone Encounter (Signed)
Clearance note faxed to Dr.Bermudez at fax # [phone].

## 2017-05-24 NOTE — Telephone Encounter (Signed)
   Primary Cardiologist: No primary care provider on file.  Chart reviewed as part of pre-operative protocol coverage. Given past medical history and time since last visit with Dr Meda Coffee 05/21/17, based on ACC/AHA guidelines, RAYOLA EVERHART would be at acceptable risk for the planned procedure without further cardiovascular testing.   I will route this recommendation to the requesting party via Epic fax function and remove from pre-op pool.  Please call with questions.  Kerin Ransom, PA-C 05/24/2017, 3:18 PM

## 2017-05-30 ENCOUNTER — Ambulatory Visit (HOSPITAL_COMMUNITY): Payer: Medicare Other | Attending: Internal Medicine

## 2017-05-30 ENCOUNTER — Telehealth: Payer: Self-pay | Admitting: Cardiology

## 2017-05-30 NOTE — Telephone Encounter (Signed)
New message      Pt c/o medication issue:  1. Name of Medication:  Losartan potassium   2. How are you currently taking this medication (dosage and times per day)? 1 tablet a day 50 mg   3. Are you having a reaction (difficulty breathing--STAT)? no  4. What is your medication issue?  Medication recall

## 2017-05-30 NOTE — Telephone Encounter (Signed)
Advised pt to contact her pharmacy to make sure her medication is not involved in the recall.  Pt states she doesn't care whether it's the manufacturer or not she wants a different medication to treat for HTN.  She does not feel comfortable taking anything r/t Losartan now because it has been linked to cancer and she has a very strong family history of cancer.  She is aware I will forward this information to Dr Meda Coffee for review and someone will c/b with further orders/recommendations.

## 2017-06-01 NOTE — Telephone Encounter (Signed)
Spoke with the pt and informed her of Dr Francesca Oman recommendations.  Advised the pt to follow-up with her pharmacy.  Pt verbalized understanding and agrees with this plan.

## 2017-06-01 NOTE — Telephone Encounter (Signed)
It was only on three LOTS of the medication, the pharmacy should know and call the patients if they were dispensed those, and replace tham with the ones that were not in recall.

## 2017-06-05 HISTORY — PX: GASTRIC BYPASS: SHX52

## 2017-06-06 ENCOUNTER — Encounter: Payer: Self-pay | Admitting: Cardiology

## 2017-06-07 DIAGNOSIS — R269 Unspecified abnormalities of gait and mobility: Secondary | ICD-10-CM | POA: Diagnosis not present

## 2017-06-07 DIAGNOSIS — G4733 Obstructive sleep apnea (adult) (pediatric): Secondary | ICD-10-CM | POA: Diagnosis not present

## 2017-06-11 ENCOUNTER — Telehealth: Payer: Self-pay | Admitting: Internal Medicine

## 2017-06-11 NOTE — Telephone Encounter (Signed)
Talked to [facility] - she has received records but needs the path report of the abnl PAP smear. Mamie said she will call again; Informed pt - stated she will call also.

## 2017-06-11 NOTE — Telephone Encounter (Signed)
PT CALLED WANTED TO KNOW WHAT STATUS WAS ON GETTING HER IN TO [facility] FOR GYN VISIT.

## 2017-06-12 ENCOUNTER — Encounter: Payer: Self-pay | Admitting: Neurology

## 2017-06-12 ENCOUNTER — Ambulatory Visit: Payer: Medicare Other | Admitting: Neurology

## 2017-06-12 VITALS — BP 131/87 | HR 80 | Ht 65.5 in | Wt 304.0 lb

## 2017-06-12 DIAGNOSIS — G40919 Epilepsy, unspecified, intractable, without status epilepticus: Secondary | ICD-10-CM | POA: Diagnosis not present

## 2017-06-12 DIAGNOSIS — M79601 Pain in right arm: Secondary | ICD-10-CM

## 2017-06-12 DIAGNOSIS — Z9689 Presence of other specified functional implants: Secondary | ICD-10-CM | POA: Diagnosis not present

## 2017-06-12 MED ORDER — LAMOTRIGINE 25 MG PO TABS: ORAL_TABLET | ORAL | 0 refills | Status: DC

## 2017-06-12 MED ORDER — TOPIRAMATE 200 MG PO TABS: 200.0000 mg | ORAL_TABLET | Freq: Two times a day (BID) | ORAL | 11 refills | Status: DC

## 2017-06-12 MED ORDER — LAMOTRIGINE 100 MG PO TABS: 100.0000 mg | ORAL_TABLET | Freq: Two times a day (BID) | ORAL | 11 refills | Status: DC

## 2017-06-12 NOTE — Progress Notes (Signed)
PATIENT: [name] DOB: May 15, 1966  Chief Complaint  Patient presents with  . Right arm pain    Reports pain in right arm since having an infected portacath in 2014.    Marland Kitchen PCP    Shela Leff, MD - referring MD      HISTORICAL  [name] is a 53 year old female, seen in refer by her primary care doctor Guadlupe Spanish for evaluation of right arm pain, initial evaluation was on June 12, 2017.  I reviewed and summarized the referring note, she has history of hypertension, hypothyroidism, coronary artery disease, the patient at [facility] weight loss program, candidate for bariatric surgery, BMI more than 50, she had a history of vagal nerve stimulator in the past,  I was able to review [facility] neurology visit by Dr. 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, based on the note, she is supposed to take Keppra 500 twice daily, Topamax 200 mg twice daily, reported seizure spells around the time of her menstruation, described seizure as dizziness spells, followed by room spinning, inability to move, then having generalized tonic-clonic body shaking, episodes last for a few minutes,  In 2014, she was treated with prolonged antibiotic status post vagal nerve stimulator pocket infection with Musser and Morganella morganii, she completed 4-week course of ertapenem, Dapto, and rifampin on August 19, 2012, was started on oral suppression with TMP/SMX on August 20, 2012, continued p.o. antibiotic without problems,  She also complains of pain and swelling of right arm, even went through cardiology evaluation that was determined not to be cardiac etiology,  She was seen by neurologist on Oct 23, 2012, EMG.NCS was normal in July 2014.  Today she continue  complains of right arm swelling, right shoulder pain, radiating to right lateral arm, pronation and supination aggravate her right arm pain, intermittent weak grip on the right-hand side, difficult to make a tight fist,  She also complains of mild neck pain, mild gait abnormality, denies bowel bladder incontinence.  Complains of frequent dizzy spells, she continue complains of seizure-like activity, most recent one was in June 09, 2017, she stares, she can feel it coming on, get confused, lasting for a few minutes,  She is only taking Topamax 100 mg 2 tablets twice daily, she has stopped the Keppra due to side effects,   REVIEW OF SYSTEMS: Full 14 system review of systems performed and notable only for as above  ALLERGIES: Allergies  Allergen Reactions  . Amitriptyline Swelling  . Amlodipine Swelling  . Latex Hives, Itching, Swelling and Rash    Burning also  . Amantadines Swelling  . Aspirin Nausea Only    Other reaction(s): Other (See Comments) Irritates stomach also/hx of stomach ulcers  . Simvastatin Swelling    Other reaction(s): Other (See Comments) pain Muscle pains also  . Tape Rash    HOME MEDICATIONS: Current Outpatient Medications  Medication Sig Dispense Refill  . acetaminophen (TYLENOL) 325 MG tablet Take 975 mg by mouth every 6 (six) hours as needed for headache.    . albuterol (PROVENTIL HFA;VENTOLIN HFA) 108 (90 Base) MCG/ACT inhaler Inhale 2 puffs into the lungs every 6 (six) hours as needed for wheezing or shortness of breath. 1 Inhaler 6  . carvedilol (COREG) 25 MG tablet Take 1 tablet (25 mg total) by  mouth 2 (two) times daily with a meal. 60 tablet 1  . cyclobenzaprine (FLEXERIL) 5 MG tablet Take 5 mg by mouth 2 (two) times daily.    . fluticasone (FLONASE) 50 MCG/ACT nasal spray Place 1 spray into both nostrils 2 (two) times daily as needed for allergies.     Marland Kitchen gabapentin (NEURONTIN) 300 MG capsule TAKE 1 CAPSULE (300 MG TOTAL) BY MOUTH AT BEDTIME. 90  capsule 0  . isosorbide mononitrate (IMDUR) 60 MG 24 hr tablet TAKE 1 TABLET (60 MG TOTAL) BY MOUTH 2 (TWO) TIMES DAILY. 60 tablet 6  . levothyroxine (SYNTHROID, LEVOTHROID) 50 MCG tablet Take 50 mcg by mouth daily.     Marland Kitchen LINZESS 290 MCG CAPS capsule Take 290 mcg by mouth daily.  0  . mometasone-formoterol (DULERA) 100-5 MCG/ACT AERO Inhale 2 puffs into the lungs 2 (two) times daily. 1 Inhaler 2  . montelukast (SINGULAIR) 10 MG tablet Take 10 mg by mouth daily as needed (for allergies).     . nitroGLYCERIN (NITRODUR - DOSED IN MG/24 HR) 0.2 mg/hr patch PLACE 1 PATCH ONTO THE SKIN DAILY. 30 patch 1  . nitroGLYCERIN (NITROSTAT) 0.4 MG SL tablet Place 1 tablet (0.4 mg total) under the tongue every 5 (five) minutes as needed for chest pain. 25 tablet 5  . pantoprazole (PROTONIX) 40 MG tablet Take 1 tablet (40 mg total) by mouth 2 (two) times daily. 60 tablet 2  . promethazine (PHENERGAN) 25 MG tablet Take 25 mg by mouth every 4 (four) hours as needed for nausea or vomiting.     . topiramate (TOPAMAX) 100 MG tablet Take 200 mg by mouth 2 (two) times daily.    . traZODone (DESYREL) 100 MG tablet Take 100 mg by mouth daily.    . Venlafaxine HCl 150 MG TB24 Take 150 mg by mouth daily.    . verapamil (CALAN-SR) 240 MG CR tablet Take 1 tablet (240 mg total) by mouth at bedtime. 90 tablet 2  . furosemide (LASIX) 40 MG tablet Take 1 tablet (40 mg total) by mouth 2 (two) times daily. 180 tablet 1  . losartan (COZAAR) 50 MG tablet Take 1 tablet (50 mg total) by mouth daily. 90 tablet 2  . spironolactone (ALDACTONE) 25 MG tablet Take 1 tablet (25 mg total) by mouth daily. 90 tablet 3   No current facility-administered medications for this visit.     PAST MEDICAL HISTORY: Past Medical History:  Diagnosis Date  . Asthma   . Biliary dyskinesia    a. s/p cholecystectomy.  . 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 Abnl stress [place] 2008, f/u cath reportedly nl. b. ETT-Myoview 04/2011 -  EKG changes but normal perfusion. Cor CT - no coronary calcium, no definite stenosis though mRCA not fully evaluated. c. 10/2011 - tn elevated in [place], LHC without CAD. Started on Ranexa, anti-anginals ?microvascular dz but later stopped while in hospital on abx.  . Complication of anesthesia    hard to wake up-had to be reminded to breath  . GERD (gastroesophageal reflux disease)    a. Severe.  Marland Kitchen HTN (hypertension)   . Hx of cardiovascular stress test    [place] Myoview 8/14:  Normal, EF 74%  . Hx of echocardiogram    Echo 3/16:  Mild LVH, EF 55-60%, Gr 1 DD, trivial MR, mild LAE, normal RVF  . Hypothyroid   . MI (myocardial infarction) ([place])   . Migraine   . MRSA infection  a. After vagal nerve stimulator at [facility] - surgical site MRSA infection, PICC placed.  . Obesity   . Palpitations    a. 08/2014: 48 hour holter with 2 PVCs otherwise normal.  . Seizure disorder ([place])    a. since childhood. b. s/p vagal nerve stimulator at [facility].  . Seizures ([place])     PAST SURGICAL HISTORY: Past Surgical History:  Procedure Laterality Date  . [place] N/A 06/01/2016   Procedure: Right Heart Cath;  Surgeon: Larey Dresser, MD;  Location: [place] [facility];  Service: Cardiovascular;  Laterality: N/A;  . CESAREAN SECTION     placement of vagal nerve stimulator.  . CHOLECYSTECTOMY N/A 01/24/2013   Procedure: LAPAROSCOPIC CHOLECYSTECTOMY;  Surgeon: Harl Bowie, MD;  Location: [place];  Service: General;  Laterality: N/A;  . IMPLANTATION VAGAL NERVE STIMULATOR  [phone]   battery [facility]    FAMILY HISTORY: Family History  Problem Relation Age of Onset  . Coronary artery disease Mother 58  . Other Father        killed  . Kidney disease Father   . Coronary artery disease Maternal Grandmother   . Cancer Other   . Hypertension Other   . Stroke Other     SOCIAL HISTORY:  Social History     Socioeconomic History  . Marital status: Single    Spouse name: Not on file  . Number of children: 2  . Years of education: college  . Highest education level: Associate degree: academic program  Social Needs  . Financial resource strain: Not on file  . Food insecurity - worry: Not on file  . Food insecurity - inability: Not on file  . Transportation needs - medical: Not on file  . Transportation needs - non-medical: Not on file  Occupational History  . Occupation: Disabled  Tobacco Use  . Smoking status: Never Smoker  . Smokeless tobacco: Never Used  Substance and Sexual Activity  . Alcohol use: Yes    Alcohol/week: 0.0 oz    Types: 1 - 3 Glasses of wine per week    Comment: occasional  . Drug use: No  . Sexual activity: Yes    Birth control/protection: None  Other Topics Concern  . Not on file  Social History Narrative   Lives at home with her daughter.   Right-handed.   No caffeine per day.     PHYSICAL EXAM   Vitals:   06/12/17 1022  BP: 131/87  Pulse: 80  Weight: (!) 304 lb (137.9 kg)  Height: 5' 5.5" (1.664 m)    Not recorded      Body mass index is 49.82 kg/m.  PHYSICAL EXAMNIATION:  Gen: NAD, conversant, well nourised, obese, well groomed                     Cardiovascular: Regular rate rhythm, no peripheral edema, warm, nontender. Eyes: Conjunctivae clear without exudates or hemorrhage Neck: Supple, no carotid bruits. Pulmonary: Clear to auscultation bilaterally   NEUROLOGICAL EXAM:  MENTAL STATUS: Speech:    Speech is normal; fluent and spontaneous with normal comprehension.  Cognition:     Orientation to time, place and person     Normal recent and remote memory     Normal Attention span and concentration     Normal Language, naming, repeating,spontaneous speech     Fund of knowledge   CRANIAL NERVES: CN  II: Visual fields are full to confrontation. Fundoscopic exam is normal with sharp discs and no vascular changes. Pupils are  round equal and briskly reactive to light. CN III, IV, VI: extraocular movement are normal. No ptosis. CN V: Facial sensation is intact to pinprick in all 3 divisions bilaterally. Corneal responses are intact.  CN VII: Face is symmetric with normal eye closure and smile. CN VIII: Hearing is normal to rubbing fingers CN IX, X: Palate elevates symmetrically. Phonation is normal. CN XI: Head turning and shoulder shrug are intact CN XII: Tongue is midline with normal movements and no atrophy.  MOTOR: There is no pronator drift of out-stretched arms. Muscle bulk and tone are normal. Muscle strength is normal.  REFLEXES: Reflexes are 2+ and symmetric at the biceps, triceps, knees, and ankles. Plantar responses are flexor.  SENSORY: Intact to light touch, pinprick, positional sensation and vibratory sensation are intact in fingers and toes.  COORDINATION: Rapid alternating movements and fine finger movements are intact. There is no dysmetria on finger-to-nose and heel-knee-shin.    GAIT/STANCE: Posture is normal. Gait is steady with normal steps, base, arm swing, and turning. Heel and toe walking are normal. Tandem gait is normal.  Romberg is absent.   DIAGNOSTIC DATA (LABS, IMAGING, TESTING) - I reviewed patient records, labs, notes, testing and imaging myself where available.   ASSESSMENT AND PLAN  [name] is a 52 y.o. female    Epilepsy  Continue Topamax 200 mg twice a day, she wants to continue her epilepsy management at [facility] Dr. Joesph July Right arm pain  Since 2014, following her right arm PICC line placement, will repeat EMG nerve conduction study,  Marcial Pacas, M.D. Ph.D.  [facility] [address], [place], [place] [address] Ph: [phone] Fax: [phone]  CC: Katherine Roan, MD

## 2017-06-15 ENCOUNTER — Telehealth: Payer: Self-pay | Admitting: *Deleted

## 2017-06-15 DIAGNOSIS — J4520 Mild intermittent asthma, uncomplicated: Secondary | ICD-10-CM | POA: Diagnosis not present

## 2017-06-15 DIAGNOSIS — I10 Essential (primary) hypertension: Secondary | ICD-10-CM | POA: Diagnosis not present

## 2017-06-15 DIAGNOSIS — Z760 Encounter for issue of repeat prescription: Secondary | ICD-10-CM | POA: Diagnosis not present

## 2017-06-15 DIAGNOSIS — L732 Hidradenitis suppurativa: Secondary | ICD-10-CM | POA: Diagnosis not present

## 2017-06-15 NOTE — Telephone Encounter (Signed)
-----   Message from Sueanne Margarita, MD sent at 06/09/2017  7:54 PM EST ----- Good AHI on CPAP but needs to improve compliance

## 2017-06-15 NOTE — Telephone Encounter (Signed)
Informed patient of compliance results and verbalized understanding was indicated. Patient understands her apnea events are in normal range at 1.8. Patient understands she needs to improve her compliance. Patient explains she can not sleep at night and has not been wearing her CPAP. Patient was informed she can wear her CPAP during daytime naps to get her compliance hours. Patient was grateful for the call and said she would try wearing her unit during the day.

## 2017-06-16 DIAGNOSIS — E559 Vitamin D deficiency, unspecified: Secondary | ICD-10-CM | POA: Diagnosis not present

## 2017-06-16 DIAGNOSIS — Z760 Encounter for issue of repeat prescription: Secondary | ICD-10-CM | POA: Diagnosis not present

## 2017-06-16 DIAGNOSIS — I10 Essential (primary) hypertension: Secondary | ICD-10-CM | POA: Diagnosis not present

## 2017-06-16 DIAGNOSIS — J4520 Mild intermittent asthma, uncomplicated: Secondary | ICD-10-CM | POA: Diagnosis not present

## 2017-06-28 NOTE — Telephone Encounter (Signed)
Called patient and LMTCB also left the office number and the number for [facility].

## 2017-06-29 NOTE — Telephone Encounter (Signed)
Patient has a 10 week follow up appointment scheduled for Thursday February 21 at 3:20 2018. Patient understands she needs to keep this appointment for insurance compliance. Patient was grateful for the call and thanked me.

## 2017-07-08 DIAGNOSIS — G4733 Obstructive sleep apnea (adult) (pediatric): Secondary | ICD-10-CM | POA: Diagnosis not present

## 2017-07-08 DIAGNOSIS — R269 Unspecified abnormalities of gait and mobility: Secondary | ICD-10-CM | POA: Diagnosis not present

## 2017-07-13 ENCOUNTER — Ambulatory Visit (INDEPENDENT_AMBULATORY_CARE_PROVIDER_SITE_OTHER): Payer: Medicare Other | Admitting: Neurology

## 2017-07-13 DIAGNOSIS — Z0289 Encounter for other administrative examinations: Secondary | ICD-10-CM

## 2017-07-13 DIAGNOSIS — R202 Paresthesia of skin: Secondary | ICD-10-CM

## 2017-07-13 DIAGNOSIS — G40919 Epilepsy, unspecified, intractable, without status epilepticus: Secondary | ICD-10-CM

## 2017-07-13 DIAGNOSIS — Z9689 Presence of other specified functional implants: Secondary | ICD-10-CM

## 2017-07-13 DIAGNOSIS — M79601 Pain in right arm: Secondary | ICD-10-CM

## 2017-07-13 NOTE — Procedures (Signed)
Full Name: [name] Gender: Female MRN #: [id number] Date of Birth: 03-03-1966    Visit Date: 07/13/2017 12:12 Age: 52 Years 11 Months Old Examining Physician: Marcial Pacas, MD  Referring Physician: Krista Blue, MD History: 52 year old female presented with bilateral hand and arm paresthesia frequent nocturnal awakening  Summary of the tests: Nerve conduction study: Bilateral ulnar sensory and motor responses were normal.  Right radial sensory response was normal  Right median sensory response was absent.  Left median sensory response showed mildly decreased snap amplitude.  Bilateral median motor responses showed mildly prolonged distal latency, right side is moderate, left side is mild, with well-preserved C map amplitude, conduction velocity.  Electromyography: Selective needle examinations was performed at right upper extremity muscles, right cervical paraspinal muscles; left abductor pollicis brevis.  There is evidence of chronic neuropathic changes at bilateral abductor pollicis brevis, there is no evidence of active denervation.  Conclusion: This is an abnormal study.  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    ------------------------------- Marcial Pacas, M.D.  [facility] [place], [place] [address] Tel: [phone] Fax: [phone]        [facility]    Nerve / Sites Muscle Latency Ref. Amplitude Ref. Rel Amp Segments Distance Velocity Ref. Area    ms ms mV mV %  cm m/s m/s mVms  R Median - APB     Wrist APB 5.5 ?4.4 7.5 ?4.0 100 Wrist - APB 7   24.8     Upper arm APB 9.3  6.4  85.6 Upper arm - Wrist 22 58 ?49 21.5  L Median - APB     Wrist APB 4.5 ?4.4 8.2 ?4.0 100 Wrist - APB 7   30.8     Upper arm APB 8.7  7.5  92 Upper arm - Wrist 22 53 ?49 27.5  R Ulnar - ADM     Wrist  ADM 2.3 ?3.3 9.2 ?6.0 100 Wrist - ADM 7   29.3     B.Elbow ADM 6.1  7.3  80.1 B.Elbow - Wrist 20 52 ?49 25.2     A.Elbow ADM 8.0  7.4  101 A.Elbow - B.Elbow 10 53 ?49 23.9         A.Elbow - Wrist               SNC    Nerve / Sites Rec. Site Peak Lat Ref.  Amp Ref. Segments Distance    ms ms V V  cm  R Radial - Anatomical snuff box (Forearm)     Forearm Wrist 2.3 ?2.9 15 ?15 Forearm - Wrist 10  R Median - Orthodromic (Dig II, Mid palm)     Dig II Wrist NR ?3.4 NR ?10 Dig II - Wrist 13  L Median - Orthodromic (Dig II, Mid palm)     Dig II Wrist 4.0 ?3.4 7 ?10 Dig II - Wrist 13  R Ulnar - Orthodromic, (Dig V, Mid palm)     Dig V Wrist 2.4 ?3.1 10 ?5 Dig V - Wrist 11  L Ulnar - Orthodromic, (Dig V, Mid palm)     Dig V Wrist 2.4 ?3.1 7 ?5 Dig V - Wrist 4               F  Wave    Nerve F Lat Ref.  ms ms  R Ulnar - ADM 27.0 ?32.0       EMG full       EMG Summary Table    Spontaneous MUAP Recruitment  Muscle IA Fib PSW Fasc Other Amp Dur. Poly Pattern  R. Abductor pollicis brevis Normal None None None _______ Normal Normal Normal Reduced  R. Pronator teres Normal None None None _______ Normal Normal Normal Normal  R. Biceps brachii Normal None None None _______ Normal Normal Normal Normal  R. Deltoid Normal None None None _______ Normal Normal Normal Normal  R. Triceps brachii Normal None None None _______ Normal Normal Normal Normal  R. Extensor digitorum communis Normal None None None _______ Normal Normal Normal Normal  R. Flexor carpi ulnaris Normal None None None _______ Normal Normal Normal Normal  R. Cervical paraspinals Normal None None None _______ Normal Normal Normal Normal  L. Abductor pollicis brevis Normal None None None _______ Normal Normal Normal Reduced

## 2017-07-14 ENCOUNTER — Ambulatory Visit: Payer: Medicare Other | Admitting: Cardiology

## 2017-07-19 MED ORDER — LOSARTAN POTASSIUM 50 MG PO TABS: 50.0000 mg | ORAL_TABLET | Freq: Every day | ORAL | 2 refills | Status: DC

## 2017-07-19 NOTE — Addendum Note (Signed)
Addended by: Juventino Slovak on: 07/19/2017 05:01 PM   Modules accepted: Orders

## 2017-07-23 DIAGNOSIS — I252 Old myocardial infarction: Secondary | ICD-10-CM | POA: Diagnosis not present

## 2017-07-23 DIAGNOSIS — R112 Nausea with vomiting, unspecified: Secondary | ICD-10-CM | POA: Diagnosis not present

## 2017-07-23 DIAGNOSIS — J4550 Severe persistent asthma, uncomplicated: Secondary | ICD-10-CM | POA: Diagnosis not present

## 2017-07-23 DIAGNOSIS — K222 Esophageal obstruction: Secondary | ICD-10-CM | POA: Diagnosis not present

## 2017-07-23 DIAGNOSIS — G40909 Epilepsy, unspecified, not intractable, without status epilepticus: Secondary | ICD-10-CM | POA: Diagnosis not present

## 2017-07-23 DIAGNOSIS — E039 Hypothyroidism, unspecified: Secondary | ICD-10-CM | POA: Diagnosis not present

## 2017-07-23 DIAGNOSIS — K2930 Chronic superficial gastritis without bleeding: Secondary | ICD-10-CM | POA: Diagnosis not present

## 2017-07-23 DIAGNOSIS — E785 Hyperlipidemia, unspecified: Secondary | ICD-10-CM | POA: Diagnosis not present

## 2017-07-23 DIAGNOSIS — K219 Gastro-esophageal reflux disease without esophagitis: Secondary | ICD-10-CM | POA: Diagnosis not present

## 2017-07-23 DIAGNOSIS — Z91040 Latex allergy status: Secondary | ICD-10-CM | POA: Diagnosis not present

## 2017-07-23 DIAGNOSIS — K2960 Other gastritis without bleeding: Secondary | ICD-10-CM | POA: Diagnosis not present

## 2017-07-23 DIAGNOSIS — K3189 Other diseases of stomach and duodenum: Secondary | ICD-10-CM | POA: Diagnosis not present

## 2017-07-23 DIAGNOSIS — R0789 Other chest pain: Secondary | ICD-10-CM | POA: Diagnosis not present

## 2017-07-23 DIAGNOSIS — Z79899 Other long term (current) drug therapy: Secondary | ICD-10-CM | POA: Diagnosis not present

## 2017-07-23 DIAGNOSIS — Z91048 Other nonmedicinal substance allergy status: Secondary | ICD-10-CM | POA: Diagnosis not present

## 2017-07-23 DIAGNOSIS — I5032 Chronic diastolic (congestive) heart failure: Secondary | ICD-10-CM | POA: Diagnosis not present

## 2017-07-23 DIAGNOSIS — I110 Hypertensive heart disease with heart failure: Secondary | ICD-10-CM | POA: Diagnosis not present

## 2017-07-23 DIAGNOSIS — R1310 Dysphagia, unspecified: Secondary | ICD-10-CM | POA: Diagnosis not present

## 2017-07-23 DIAGNOSIS — Z888 Allergy status to other drugs, medicaments and biological substances status: Secondary | ICD-10-CM | POA: Diagnosis not present

## 2017-07-23 DIAGNOSIS — Z886 Allergy status to analgesic agent status: Secondary | ICD-10-CM | POA: Diagnosis not present

## 2017-07-23 DIAGNOSIS — G4733 Obstructive sleep apnea (adult) (pediatric): Secondary | ICD-10-CM | POA: Diagnosis not present

## 2017-07-23 DIAGNOSIS — Z79890 Hormone replacement therapy (postmenopausal): Secondary | ICD-10-CM | POA: Diagnosis not present

## 2017-07-26 ENCOUNTER — Encounter: Payer: Self-pay | Admitting: Cardiology

## 2017-07-26 ENCOUNTER — Ambulatory Visit (INDEPENDENT_AMBULATORY_CARE_PROVIDER_SITE_OTHER): Payer: Medicare Other | Admitting: Cardiology

## 2017-07-26 VITALS — BP 152/96 | HR 96 | Ht 65.5 in | Wt 307.6 lb

## 2017-07-26 DIAGNOSIS — I10 Essential (primary) hypertension: Secondary | ICD-10-CM | POA: Diagnosis not present

## 2017-07-26 DIAGNOSIS — G4733 Obstructive sleep apnea (adult) (pediatric): Secondary | ICD-10-CM | POA: Diagnosis not present

## 2017-07-26 HISTORY — DX: Morbid (severe) obesity due to excess calories: E66.01

## 2017-07-26 HISTORY — DX: Obstructive sleep apnea (adult) (pediatric): G47.33

## 2017-07-26 MED ORDER — ISOSORBIDE MONONITRATE ER 60 MG PO TB24: ORAL_TABLET | ORAL | 3 refills | Status: DC

## 2017-07-26 MED ORDER — FUROSEMIDE 40 MG PO TABS: 40.0000 mg | ORAL_TABLET | Freq: Two times a day (BID) | ORAL | 3 refills | Status: DC

## 2017-07-26 MED ORDER — CARVEDILOL 25 MG PO TABS: 25.0000 mg | ORAL_TABLET | Freq: Two times a day (BID) | ORAL | 3 refills | Status: DC

## 2017-07-26 NOTE — Patient Instructions (Signed)
Medication Instructions:  Your physician recommends that you continue on your current medications as directed. Please refer to the Current Medication list given to you today.  If you need a refill on your cardiac medications, please contact your pharmacy first.  Labwork: None ordered   Testing/Procedures: None ordered   Follow-Up: Your physician wants you to follow-up in: 1 year with Dr. Turner. You will receive a reminder letter in the mail two months in advance. If you don't receive a letter, please call our office to schedule the follow-up appointment.  Any Other Special Instructions Will Be Listed Below (If Applicable).   Thank you for choosing [facility]    Rena Sherolyn Trettin, RN  [phone]  If you need a refill on your cardiac medications before your next appointment, please call your pharmacy.   

## 2017-07-26 NOTE — Progress Notes (Signed)
Cardiology Office Note:    Date:  07/26/2017   ID:  [name], DOB [birth year], MRN [id number]  PCP:  Katherine Roan, MD  Cardiologist:  No primary care provider on file.    Referring MD: Katherine Roan, MD   Chief Complaint  Patient presents with  . Sleep Apnea  . Hypertension    History of Present Illness:    [name] is a 52 y.o. female with a hx of chronic CP and CHF who is referred for evaluation of OSA.  She was referred by Dr. Meda Coffee for sleep study due to CHF, excessive daytime sleepiness with an Epworth sleepiness score of 24,snoring and morning headaches.  She underwent PSG showing moderate OSA with an AHI of 21.9/hr with oxygen desaturations of 79%.  She underwent CPAP titration to 7cm H2O.    She is doing well with her CPAP device and thinks that she has gotten used to it.  She tolerates the mask and feels the pressure is adequate.  Since going on CPAP she feels rested in the am and has no significant daytime sleepiness.  She denies any significant mouth or nasal dryness or nasal congestion.  Sh e does not think that he snores.     Past Medical History:  Diagnosis Date  . Asthma   . Biliary dyskinesia    a. s/p cholecystectomy.  . 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 Abnl stress [place] 2008, f/u cath reportedly nl. b. ETT-Myoview 04/2011 - EKG changes but normal perfusion. Cor CT - no coronary calcium, no definite stenosis though mRCA not fully evaluated. c. 10/2011 - tn elevated in [place], LHC without CAD. Started on Ranexa, anti-anginals ?microvascular dz but later stopped while in hospital on abx.  . Complication of anesthesia    hard to wake up-had to be reminded to breath  . GERD (gastroesophageal reflux disease)    a. Severe.  Marland Kitchen HTN (hypertension)   . Hx of cardiovascular stress test    Lex Myoview 8/14:  Normal, EF 74%  . Hx of echocardiogram    Echo 3/16:  Mild LVH, EF 55-60%, Gr 1 DD, trivial MR, mild LAE, normal RVF  .  Hypothyroid   . MI (myocardial infarction) ([place])   . Migraine   . Morbid obesity ([place]) 07/26/2017  . MRSA infection    a. After vagal nerve stimulator at [facility] - surgical site MRSA infection, PICC placed.  . Obesity   . OSA (obstructive sleep apnea) 07/26/2017  . Palpitations    a. 08/2014: 48 hour holter with 2 PVCs otherwise normal.  . Seizure disorder ([place])    a. since childhood. b. s/p vagal nerve stimulator at [facility].  . Seizures ([place])     Past Surgical History:  Procedure Laterality Date  . [place] N/A 06/01/2016   Procedure: Right Heart Cath;  Surgeon: Larey Dresser, MD;  Location: [place] CV [facility];  Service: Cardiovascular;  Laterality: N/A;  . CESAREAN SECTION     placement of vagal nerve stimulator.  . CHOLECYSTECTOMY N/A 01/24/2013   Procedure: LAPAROSCOPIC CHOLECYSTECTOMY;  Surgeon: Harl Bowie, MD;  Location: [place];  Service: General;  Laterality: N/A;  . IMPLANTATION VAGAL NERVE STIMULATOR  [id number]   battery chg-[facility]    Current Medications: Current Meds  Medication Sig  . acetaminophen (TYLENOL) 325 MG tablet Take 975 mg by  mouth every 6 (six) hours as needed for headache.  . albuterol (PROVENTIL HFA;VENTOLIN HFA) 108 (90 Base) MCG/ACT inhaler Inhale 2 puffs into the lungs every 6 (six) hours as needed for wheezing or shortness of breath.  . cyclobenzaprine (FLEXERIL) 5 MG tablet Take 5 mg by mouth 2 (two) times daily.  . fluticasone (FLONASE) 50 MCG/ACT nasal spray Place 1 spray into both nostrils 2 (two) times daily as needed for allergies.   Marland Kitchen gabapentin (NEURONTIN) 300 MG capsule TAKE 1 CAPSULE (300 MG TOTAL) BY MOUTH AT BEDTIME.  Marland Kitchen lamoTRIgine (LAMICTAL) 100 MG tablet Take 1 tablet (100 mg total) by mouth 2 (two) times daily.  Marland Kitchen lamoTRIgine (LAMICTAL) 25 MG tablet 1 tablet twice a day for the first week 2 tablets twice a day for the second week 3  tablets twice a day for the third week 4 tablets twice a day for the fourth week  For total of 140 tablets  After finish titration with small dose of lamotrigine 25 mg, change to lamotrigine 100 mg twice a day  . levothyroxine (SYNTHROID, LEVOTHROID) 50 MCG tablet Take 50 mcg by mouth daily.   Marland Kitchen LINZESS 290 MCG CAPS capsule Take 290 mcg by mouth daily.  Marland Kitchen losartan (COZAAR) 50 MG tablet Take 1 tablet (50 mg total) by mouth daily.  . mometasone-formoterol (DULERA) 100-5 MCG/ACT AERO Inhale 2 puffs into the lungs 2 (two) times daily.  . montelukast (SINGULAIR) 10 MG tablet Take 10 mg by mouth daily as needed (for allergies).   . nitroGLYCERIN (NITRODUR - DOSED IN MG/24 HR) 0.2 mg/hr patch PLACE 1 PATCH ONTO THE SKIN DAILY.  . nitroGLYCERIN (NITROSTAT) 0.4 MG SL tablet Place 1 tablet (0.4 mg total) under the tongue every 5 (five) minutes as needed for chest pain.  . pantoprazole (PROTONIX) 40 MG tablet Take 1 tablet (40 mg total) by mouth 2 (two) times daily.  . promethazine (PHENERGAN) 25 MG tablet Take 25 mg by mouth every 4 (four) hours as needed for nausea or vomiting.   . topiramate (TOPAMAX) 200 MG tablet Take 1 tablet (200 mg total) by mouth 2 (two) times daily.  . traZODone (DESYREL) 100 MG tablet Take 100 mg by mouth daily.  . Venlafaxine HCl 150 MG TB24 Take 150 mg by mouth daily.  . verapamil (CALAN-SR) 240 MG CR tablet Take 1 tablet (240 mg total) by mouth at bedtime.  . [DISCONTINUED] carvedilol (COREG) 25 MG tablet Take 1 tablet (25 mg total) by mouth 2 (two) times daily with a meal.  . [DISCONTINUED] isosorbide mononitrate (IMDUR) 60 MG 24 hr tablet TAKE 1 TABLET (60 MG TOTAL) BY MOUTH 2 (TWO) TIMES DAILY.     Allergies:   Amitriptyline; Amlodipine; Latex; Amantadines; Aspirin; Simvastatin; and Tape   Social History   Socioeconomic History  . Marital status: Single    Spouse name: None  . Number of children: 2  . Years of education: college  . Highest education level:  Associate degree: academic program  Social Needs  . Financial resource strain: None  . Food insecurity - worry: None  . Food insecurity - inability: None  . Transportation needs - medical: None  . Transportation needs - non-medical: None  Occupational History  . Occupation: Disabled  Tobacco Use  . Smoking status: Never Smoker  . Smokeless tobacco: Never Used  Substance and Sexual Activity  . Alcohol use: Yes    Alcohol/week: 0.0 oz    Types: 1 - 3 Glasses of wine per week  Comment: occasional  . Drug use: No  . Sexual activity: Yes    Birth control/protection: None  Other Topics Concern  . None  Social History Narrative   Lives at home with her daughter.   Right-handed.   No caffeine per day.     Family History: The patient's family history includes Cancer in her other; Coronary artery disease in her maternal grandmother; Coronary artery disease (age of onset: 53) in her mother; Hypertension in her other; Kidney disease in her father; Other in her father; Stroke in her other.  ROS:   Please see the history of present illness.    ROS  All other systems reviewed and negative.   EKGs/Labs/Other Studies Reviewed:    The following studies were reviewed today: CPAP download  EKG:  EKG is not ordered today.    Recent Labs: 08/04/2016: B Natriuretic Peptide 22.3 11/29/2016: NT-Pro BNP 9 05/21/2017: ALT 29; BUN 14; Creatinine, Ser 0.80; Hemoglobin 12.8; Platelets 235; Potassium 4.8; Sodium 142; TSH 4.010   Recent Lipid Panel    Component Value Date/Time   CHOL 200 (H) 05/21/2017 1111   TRIG 58 05/21/2017 1111   HDL 59 05/21/2017 1111   CHOLHDL 3.4 05/21/2017 1111   CHOLHDL 3 09/30/2012 0929   VLDL 11.2 09/30/2012 0929   LDLCALC 129 (H) 05/21/2017 1111    Physical Exam:    VS:  BP (!) 152/96   Pulse 96   Ht 5' 5.5" (1.664 m)   Wt (!) 307 lb 9.6 oz (139.5 kg)   SpO2 99%   BMI 50.41 kg/m     Wt Readings from Last 3 Encounters:  07/26/17 (!) 307 lb 9.6 oz  (139.5 kg)  06/12/17 (!) 304 lb (137.9 kg)  05/21/17 (!) 304 lb (137.9 kg)     GEN:  Well nourished, well developed in no acute distress HEENT: Normal NECK: No JVD; No carotid bruits LYMPHATICS: No lymphadenopathy CARDIAC: RRR, no murmurs, rubs, gallops RESPIRATORY:  Clear to auscultation without rales, wheezing or rhonchi  ABDOMEN: Soft, non-tender, non-distended MUSCULOSKELETAL:  No edema; No deformity  SKIN: Warm and dry NEUROLOGIC:  Alert and oriented x 3 PSYCHIATRIC:  Normal affect   ASSESSMENT:    1.  OSA 2.  HTN 3.  Morbid Obesity  PLAN:    In order of problems listed above:  1,  OSA - the patient is tolerating PAP therapy well without any problems. The PAP download was reviewed today and showed an AHI of 1.2/hr on 7 cm H2O with 57% compliance in using more than 4 hours nightly.  The patient has been using and benefiting from PAP use and will continue to benefit from therapy. I stressed the importance of being compliant with her CPAP in regards to insurance paying for it and also to get the most benefit from the CPAP.  2.  HTN - Bp is borderline controlled on exam today.  She will continue on Verapamil 240mg  daily, Losartan 50mg  daily and spironolactone 25mg  daily.    3.  Morbid obesity - I have encouraged her to get into a routine exercise program and cut back on carbs and portions.    Medication Adjustments/Labs and Tests Ordered: Current medicines are reviewed at length with the patient today.  Concerns regarding medicines are outlined above.  No orders of the defined types were placed in this encounter.  Meds ordered this encounter  Medications  . DISCONTD: carvedilol (COREG) 25 MG tablet    Sig: Take 1 tablet (25 mg  total) by mouth 2 (two) times daily with a meal.    Dispense:  180 tablet    Refill:  3  . DISCONTD: furosemide (LASIX) 40 MG tablet    Sig: Take 1 tablet (40 mg total) by mouth 2 (two) times daily.    Dispense:  180 tablet    Refill:  3  .  DISCONTD: isosorbide mononitrate (IMDUR) 60 MG 24 hr tablet    Sig: TAKE 1 TABLET (60 MG TOTAL) BY MOUTH 2 (TWO) TIMES DAILY.    Dispense:  180 tablet    Refill:  3    Signed, Fransico Him, MD  07/26/2017 4:13 PM    [facility]

## 2017-07-27 DIAGNOSIS — K2960 Other gastritis without bleeding: Secondary | ICD-10-CM | POA: Diagnosis not present

## 2017-07-27 DIAGNOSIS — R1310 Dysphagia, unspecified: Secondary | ICD-10-CM | POA: Diagnosis not present

## 2017-07-27 DIAGNOSIS — R112 Nausea with vomiting, unspecified: Secondary | ICD-10-CM | POA: Diagnosis not present

## 2017-07-27 DIAGNOSIS — R0789 Other chest pain: Secondary | ICD-10-CM | POA: Diagnosis not present

## 2017-08-05 DIAGNOSIS — R269 Unspecified abnormalities of gait and mobility: Secondary | ICD-10-CM | POA: Diagnosis not present

## 2017-08-05 DIAGNOSIS — G4733 Obstructive sleep apnea (adult) (pediatric): Secondary | ICD-10-CM | POA: Diagnosis not present

## 2017-08-09 ENCOUNTER — Encounter: Payer: Self-pay | Admitting: Internal Medicine

## 2017-08-09 ENCOUNTER — Ambulatory Visit (INDEPENDENT_AMBULATORY_CARE_PROVIDER_SITE_OTHER): Payer: Medicare Other | Admitting: Internal Medicine

## 2017-08-09 VITALS — BP 125/75 | HR 86 | Temp 97.7°F

## 2017-08-09 DIAGNOSIS — R5381 Other malaise: Secondary | ICD-10-CM | POA: Diagnosis not present

## 2017-08-09 DIAGNOSIS — R109 Unspecified abdominal pain: Secondary | ICD-10-CM | POA: Diagnosis not present

## 2017-08-09 DIAGNOSIS — I10 Essential (primary) hypertension: Secondary | ICD-10-CM

## 2017-08-09 DIAGNOSIS — R509 Fever, unspecified: Secondary | ICD-10-CM

## 2017-08-09 DIAGNOSIS — R110 Nausea: Secondary | ICD-10-CM

## 2017-08-09 DIAGNOSIS — R197 Diarrhea, unspecified: Secondary | ICD-10-CM

## 2017-08-09 DIAGNOSIS — R6889 Other general symptoms and signs: Secondary | ICD-10-CM | POA: Insufficient documentation

## 2017-08-09 DIAGNOSIS — Z79899 Other long term (current) drug therapy: Secondary | ICD-10-CM | POA: Diagnosis not present

## 2017-08-09 HISTORY — DX: Diarrhea, unspecified: R19.7

## 2017-08-09 LAB — INFLUENZA PANEL BY PCR (TYPE A & B)
Influenza A By PCR: NEGATIVE
Influenza B By PCR: NEGATIVE

## 2017-08-09 MED ORDER — LOPERAMIDE HCL 2 MG PO CAPS: 2.0000 mg | ORAL_CAPSULE | ORAL | 0 refills | Status: DC | PRN

## 2017-08-09 MED ORDER — PROMETHAZINE HCL 12.5 MG PO TABS: 12.5000 mg | ORAL_TABLET | Freq: Four times a day (QID) | ORAL | 0 refills | Status: DC | PRN

## 2017-08-09 NOTE — Progress Notes (Signed)
CC: [place] symptoms.  HPI:  [name] is a 52 y.o. with past medical history as listed below came to the clinic with complaint of generalized malaise, body aches, chills, subjective fever, diffuse abdominal pain, nausea and diarrhea for 2 days.  Patient was in her usual state of health until this weekend, they were in [place] and ate at [place] [employer] on Sunday night.  On Monday morning she developed diffuse crampy abdominal pain, later in the day she started diarrhea, it was watery, nonbloody, no mucus seen, she was having 5-6 episodes per day on Tuesday and Wednesday, today she had 3 loose bowel movements since morning.  Yesterday morning she developed dizziness, nausea, generalized body aches and pains and nausea.,  Along with chills and subjective fever.  She did not had any vomiting.  She denies any nasal congestion, cough or sore throat.  She denies any sick contact.  She denied any recent travel.  According to patient she is feeling very nauseous and taking some liquid with very difficulty.  She continued to take all her medications except linzess.  She  stayed in bed whole day and unable to do any work around the house.  She never took any Tylenol or Imodium.  Past Medical History:  Diagnosis Date  . Asthma   . Biliary dyskinesia    a. s/p cholecystectomy.  . 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 Abnl stress [place] 2008, f/u cath reportedly nl. b. ETT-Myoview 04/2011 - EKG changes but normal perfusion. Cor CT - no coronary calcium, no definite stenosis though mRCA not fully evaluated. c. 10/2011 - tn elevated in [place], LHC without CAD. Started on Ranexa, anti-anginals ?microvascular dz but later stopped while in hospital on abx.  . Complication of anesthesia    hard to wake up-had to be reminded to breath  . GERD (gastroesophageal reflux disease)    a. Severe.  Marland Kitchen HTN (hypertension)   . Hx of cardiovascular stress test    Lex Myoview 8/14:  Normal, EF 74%    . Hx of echocardiogram    Echo 3/16:  Mild LVH, EF 55-60%, Gr 1 DD, trivial MR, mild LAE, normal RVF  . Hypothyroid   . MI (myocardial infarction) ([place])   . Migraine   . Morbid obesity ([place]) 07/26/2017  . MRSA infection    a. After vagal nerve stimulator at [facility] - surgical site MRSA infection, PICC placed.  . Obesity   . OSA (obstructive sleep apnea) 07/26/2017  . Palpitations    a. 08/2014: 48 hour holter with 2 PVCs otherwise normal.  . Seizure disorder ([place])    a. since childhood. b. s/p vagal nerve stimulator at [facility].  . Seizures ([place])    Review of Systems: Negative except mentioned in HPI.  Physical Exam:  Vitals:   08/09/17 1405  BP: 125/75  Pulse: 86  Temp: 97.7 F (36.5 C)  TempSrc: Oral    General: Vital signs reviewed.  Patient is well-developed and orbitally obese, in no acute distress and cooperative with exam.  Head: Normocephalic and atraumatic. Eyes: EOMI, conjunctivae normal, no scleral icterus.  Cardiovascular: RRR, S1 normal, S2 normal, no murmurs, gallops, or rubs. Pulmonary/Chest: Clear to auscultation bilaterally, no wheezes, rales, or rhonchi. Abdominal: Soft, mild diffuse tenderness, non-distended, BS +, no masses, organomegaly, or guarding present.  Extremities: No lower extremity edema bilaterally,  pulses symmetric and intact bilaterally. No cyanosis or clubbing. Skin: Warm, dry and intact. No rashes  or erythema. Psychiatric: Normal mood and affect. speech and behavior is normal. Cognition and memory are normal.  Assessment & Plan:   See Encounters Tab for problem based charting.  Patient discussed with Dr. Evette Doffing.

## 2017-08-09 NOTE — Patient Instructions (Signed)
Thank you for visiting clinic today. We will check you for flu, if positive we will called a prescription of Tamiflu and let you know. We are also checking some labs to see your hydration status. I am giving you Phenergan 12.5 mg tablet for your nausea, you can take it if needed. You can try Tylenol 1000 mg every 8 hourly if needed for body aches. I am also giving you loperamide, you can take it if your diarrhea persist for another 2 days. Please keep yourself well-hydrated. Please follow-up in the clinic if your symptoms get worse or fail to improve within next week.

## 2017-08-09 NOTE — Assessment & Plan Note (Addendum)
She has watery, nonbloody diarrhea.  She had an history of eating outside and night before. Food poisoning versus viral illness can be a possibility. No need to check stool at this time, as diarrhea is improving.  We will check BMP and CBC. Patient was encouraged to keep herself well-hydrated. She can try loperamide if her diarrhea persist for another 2 days.  Addendum.  No significant electrolyte abnormalities.

## 2017-08-09 NOTE — Assessment & Plan Note (Addendum)
Her symptoms were concerning for flu, although no upper respiratory symptoms.  We will check for flu PCR-if positive will prescribe Tamiflu. Supportive care with Phenergan and Tylenol.  Addendum.  Influenza PCR negative.  Called the patient and discussed the results and advised just supportive management.  She can come back to the clinic if her symptoms fail to resolved in the next few days or get worse.

## 2017-08-09 NOTE — Assessment & Plan Note (Signed)
BP Readings from Last 3 Encounters:  08/09/17 125/75  07/26/17 (!) 152/96  06/12/17 131/87   Orthostatic VS for the past 24 hrs:  BP- Lying Pulse- Lying BP- Sitting Pulse- Sitting BP- Standing at 0 minutes Pulse- Standing at 0 minutes  08/09/17 1359 132/81 88 125/75 87 129/83 90    She was normotensive today. Orthostatic vitals were negative.  Continue current management.

## 2017-08-10 LAB — CBC WITH DIFFERENTIAL/PLATELET
Basophils Absolute: 0 10*3/uL (ref 0.0–0.2)
Basos: 0 %
EOS (ABSOLUTE): 0.1 10*3/uL (ref 0.0–0.4)
Eos: 2 %
Hematocrit: 36.9 % (ref 34.0–46.6)
Hemoglobin: 12 g/dL (ref 11.1–15.9)
Immature Grans (Abs): 0 10*3/uL (ref 0.0–0.1)
Immature Granulocytes: 0 %
Lymphocytes Absolute: 1.6 10*3/uL (ref 0.7–3.1)
Lymphs: 32 %
MCH: 26.4 pg — ABNORMAL LOW (ref 26.6–33.0)
MCHC: 32.5 g/dL (ref 31.5–35.7)
MCV: 81 fL (ref 79–97)
Monocytes Absolute: 0.7 10*3/uL (ref 0.1–0.9)
Monocytes: 13 %
Neutrophils Absolute: 2.5 10*3/uL (ref 1.4–7.0)
Neutrophils: 53 %
Platelets: 231 10*3/uL (ref 150–379)
RBC: 4.54 x10E6/uL (ref 3.77–5.28)
RDW: 15.5 % — ABNORMAL HIGH (ref 12.3–15.4)
WBC: 4.9 10*3/uL (ref 3.4–10.8)

## 2017-08-10 LAB — BMP8+ANION GAP
Anion Gap: 12 mmol/L (ref 10.0–18.0)
BUN/Creatinine Ratio: 15 (ref 9–23)
BUN: 13 mg/dL (ref 6–24)
CO2: 20 mmol/L (ref 20–29)
Calcium: 8.3 mg/dL — ABNORMAL LOW (ref 8.7–10.2)
Chloride: 107 mmol/L — ABNORMAL HIGH (ref 96–106)
Creatinine, Ser: 0.86 mg/dL (ref 0.57–1.00)
GFR calc Af Amer: 89 mL/min/{1.73_m2} (ref >59)
GFR calc non Af Amer: 77 mL/min/{1.73_m2} (ref >59)
Glucose: 92 mg/dL (ref 65–99)
Potassium: 3.7 mmol/L (ref 3.5–5.2)
Sodium: 139 mmol/L (ref 134–144)

## 2017-08-10 NOTE — Progress Notes (Signed)
Internal Medicine Clinic Attending  I saw and evaluated the patient.  I personally confirmed the key portions of the history and exam documented by Dr. Amin and I reviewed pertinent patient test results.  The assessment, diagnosis, and plan were formulated together and I agree with the documentation in the resident's note. 

## 2017-08-14 ENCOUNTER — Telehealth: Payer: Self-pay | Admitting: *Deleted

## 2017-08-14 NOTE — Telephone Encounter (Signed)
Thanks

## 2017-08-14 NOTE — Telephone Encounter (Signed)
Doctor's note written as below per VO from Dr. Reesa Chew. Patient notified and is very Patent attorney. Will pick up note tomorrow.  Also made aware that multiple attempts without success have been made to contact prior office for actual pap report. This is needed to send referral to GYN.  Nurse Tech will make one additional attempt.  Patient reports need for MRI is in notes from [place] and Neurologist Dr. Marcial Pacas should be contacted regarding implanted vagus nerve stimulator. Hubbard Hartshorn, RN, BSN

## 2017-08-14 NOTE — Telephone Encounter (Signed)
Patient called in very upset. States she called after OV on 08/09/2017 requesting letter excusing her from work 3/7 and 3/8 with return to work 08/13/2017. Has not heard back. Will route to Provider who saw her.  Patient also upset that she has not received a referral to GYN for abnormal pap smear done at previous doctor's office. States records were sent to [facility]. Also has not had follow-up on device compatibility with MRI. Will route to PCP for these. Patient states she will be looking for different provider office as these issues have not been taken care of. Discussed with [facility] director for follow-up. Hubbard Hartshorn, RN, BSN

## 2017-08-17 ENCOUNTER — Telehealth: Payer: Self-pay | Admitting: Internal Medicine

## 2017-08-17 ENCOUNTER — Other Ambulatory Visit: Payer: Self-pay | Admitting: Internal Medicine

## 2017-08-17 DIAGNOSIS — Z0000 Encounter for general adult medical examination without abnormal findings: Secondary | ICD-10-CM

## 2017-08-17 NOTE — Progress Notes (Signed)
After great difficulty, the results of the pt reported abnormality seen on pap smear has been acquired and scanned into epic.  A referral to OBGYN has been made at the patient's requrest.  Of note the abnormality is of reactive cellular changes with a top differential including infection and irritation and the pap is negative for cancerous or precancerous lesions.

## 2017-08-17 NOTE — Telephone Encounter (Signed)
Patient call the [facility] again an requested he last pap smear results be faxed per your request.  Previous attempts were made with no success.  We have finally rec'd the notes are they are in your box.  Patient is now urgently a Referral to an OBGYN as soon as possible.  PT states she has been waiting for this referral for a couple of months.

## 2017-08-17 NOTE — Telephone Encounter (Signed)
All issues have been addressed in separate phone encounters. Hubbard Hartshorn, RN, BSN

## 2017-08-17 NOTE — Telephone Encounter (Signed)
Called the patient to notify her the per the [facility] Radiology her VNS is non-compatible Unable to sch this patient's MRI for due to that .  Asked for the patient to give [place] a call back if she has any questions or she can call back to [phone] (Radiology Scheduling).

## 2017-08-23 DIAGNOSIS — J4550 Severe persistent asthma, uncomplicated: Secondary | ICD-10-CM | POA: Diagnosis not present

## 2017-08-23 DIAGNOSIS — I5032 Chronic diastolic (congestive) heart failure: Secondary | ICD-10-CM | POA: Diagnosis not present

## 2017-08-24 ENCOUNTER — Telehealth: Payer: Self-pay | Admitting: Cardiology

## 2017-08-24 NOTE — Telephone Encounter (Signed)
Spoke with the pt who is complaining of lower extremity edema and weight gain for the past 2 weeks. Pt has not been logging her weights.  Pt states she does have mild sob, but no DOE, or any other cardiac complaints at this time.  Pt states she is in no acute distress.  Pt states she juiced for about a week and noted her swelling and weight gain improve.  Pt states after she quit juicing, her weight increased as well as her lower extremity edema.  Pt also stated her clothes felt tighter too. Pt would like an appt with an Extender for next Monday 3/25. Pt is currently only taking aldactone as a diuresis, and has no recent baseline labs to compare to.  Scheduled the pt to see Daune Perch NP for next Monday 3/25 at 1:15 pm.  Dr Meda Coffee will be in the office same day.  Advised the pt to arrive 15 mins prior to this appt. Advised the pt to maintain a low sodium diet.  Advised the pt to continue her current med regimen.  Advised the pt to log her daily weights, starting tomorrow until her 3/25 OV. Advised the pt to bring those to her OV with Janine. Advised the pt that if her symptoms worsen, then she should refer to the ER for further assistance. Pt verbalized understanding and agree with this plan.  Will send this message to Dr Meda Coffee as an Juluis Rainier.

## 2017-08-24 NOTE — Telephone Encounter (Signed)
Pt c/o swelling: STAT is pt has developed SOB within 24 hours  1) How much weight have you gained and in what time span?   2) If swelling, where is the swelling located? Both legs   3) Are you currently taking a fluid pill? Yes   4) Are you currently SOB? Had sob for about 1 week   5) Do you have a log of your daily weights (if so, list)?   6) Have you gained 3 pounds in a day or 5 pounds in a week? yes  7) Have you traveled recently? NO

## 2017-08-24 NOTE — Telephone Encounter (Signed)
Left a message for the pt to call back.  

## 2017-08-27 ENCOUNTER — Ambulatory Visit (INDEPENDENT_AMBULATORY_CARE_PROVIDER_SITE_OTHER): Payer: Medicare Other | Admitting: Cardiology

## 2017-08-27 ENCOUNTER — Encounter: Payer: Self-pay | Admitting: Cardiology

## 2017-08-27 VITALS — BP 144/78 | HR 104 | Ht 65.5 in | Wt 305.0 lb

## 2017-08-27 DIAGNOSIS — R0602 Shortness of breath: Secondary | ICD-10-CM | POA: Diagnosis not present

## 2017-08-27 DIAGNOSIS — I5033 Acute on chronic diastolic (congestive) heart failure: Secondary | ICD-10-CM

## 2017-08-27 DIAGNOSIS — I5032 Chronic diastolic (congestive) heart failure: Secondary | ICD-10-CM | POA: Diagnosis not present

## 2017-08-27 DIAGNOSIS — G4733 Obstructive sleep apnea (adult) (pediatric): Secondary | ICD-10-CM

## 2017-08-27 DIAGNOSIS — I10 Essential (primary) hypertension: Secondary | ICD-10-CM

## 2017-08-27 MED ORDER — FUROSEMIDE 40 MG PO TABS: 40.0000 mg | ORAL_TABLET | Freq: Every day | ORAL | 3 refills | Status: DC

## 2017-08-27 MED ORDER — LISINOPRIL 10 MG PO TABS: 10.0000 mg | ORAL_TABLET | Freq: Every day | ORAL | 3 refills | Status: DC

## 2017-08-27 NOTE — Progress Notes (Signed)
Cardiology Office Note:    Date:  08/27/2017   ID:  DESSIREE SZE, DOB Jun 14, 1965, MRN [id number]  PCP:  Katherine Roan, MD  Cardiologist:  Ena Dawley, MD  Referring MD: Katherine Roan, MD   Chief Complaint  Patient presents with  . Shortness of Breath    swelling, weight gain    History of Present Illness:    [name] is a 52 y.o. female with a past medical history significant for history of chest pain and shortness of breath with prior negative caths, migraines, asthma, seizure disorder with vagal nerve stimulator and hypertension.  She has had chest pain chronically over several years.  She was noted to have a reportedly normal cardiac cath in [place] in 2008.  In 2012 she was noted to have ST depression on exercise ECG however Myoview showed no evidence of ischemia and normal EF.  Given her ongoing chest pain and abnormal ECG she underwent coronary CT angiogram.  It was a difficult study as they were unable to get her heart rate into the ideal range, however, she had no coronary calcium and no definite stenosis.  In 2013 in [place] she presented to the ED with exertional chest pain and had a mildly elevated troponin.  She underwent cardiac catheterization that showed no angiographic CAD.  Echocardiogram showed normal EF.  Patient developed lower leg edema about 2 weeks ago. She has been taking lasix 40 mg about once or twice a week. Over the last 5 days she has taken it daily with some improvement in her swelling. Friday evening she took 80 mg because her swelling was so bad. She had DOE with minimal exertion and orthopnea. Breathing is improved now, still has shob with walking uphill and with stairs. She gets short of breath with household activities. This has occurred in the past about 3 times. She has had chest pain like someone squeezing the inside of her chest for the last month, intermittent, occurs with activity and associated with diaphoresis and nausea. She took  NTG this morning with relief.   She has not taken her losartan in at least a week due to recall. She does not take her BP at home. She has had an ongoing headache for the last 2 weeks. She is trying to cut down on her snacks and salt intake. She is trying to get weight loss surgery and has been to preparatory classes. Home wts about 3 times per week. She notes wt has been within 2-3 pounds over the last 2 weeks. She had lost 20 pounds last month when she was juicing but has gained it back.   She has degenerative disc disease and activity is limited. She sometimes uses a rolling walker. She tries to walk daily with daily activities. She uses the stairs, but is fatigued and out of breath when she gets to the top.    Past Medical History:  Diagnosis Date  . Asthma   . Biliary dyskinesia    a. s/p cholecystectomy.  . 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 Abnl stress [place] 2008, f/u cath reportedly nl. b. ETT-Myoview 04/2011 - EKG changes but normal perfusion. Cor CT - no coronary calcium, no definite stenosis though mRCA not fully evaluated. c. 10/2011 - tn elevated in [place], LHC without CAD. Started on Ranexa, anti-anginals ?microvascular dz but later stopped while in hospital on abx.  . Complication of anesthesia    hard to wake up-had to  be reminded to breath  . GERD (gastroesophageal reflux disease)    a. Severe.  Marland Kitchen HTN (hypertension)   . Hx of cardiovascular stress test    Lex Myoview 8/14:  Normal, EF 74%  . Hx of echocardiogram    Echo 3/16:  Mild LVH, EF 55-60%, Gr 1 DD, trivial MR, mild LAE, normal RVF  . Hypothyroid   . MI (myocardial infarction) ([place])   . Migraine   . Morbid obesity ([place]) 07/26/2017  . MRSA infection    a. After vagal nerve stimulator at [facility] - surgical site MRSA infection, PICC placed.  . Obesity   . OSA (obstructive sleep apnea) 07/26/2017  . Palpitations    a. 08/2014: 48 hour holter with 2 PVCs otherwise normal.  . Seizure disorder  ([place])    a. since childhood. b. s/p vagal nerve stimulator at [facility].  . Seizures ([place])     Past Surgical History:  Procedure Laterality Date  . [place] N/A 06/01/2016   Procedure: Right Heart Cath;  Surgeon: Larey Dresser, MD;  Location: [facility];  Service: Cardiovascular;  Laterality: N/A;  . CESAREAN SECTION     placement of vagal nerve stimulator.  . CHOLECYSTECTOMY N/A 01/24/2013   Procedure: LAPAROSCOPIC CHOLECYSTECTOMY;  Surgeon: Harl Bowie, MD;  Location: [facility];  Service: General;  Laterality: N/A;  . IMPLANTATION VAGAL NERVE STIMULATOR  [id number]   battery chg-[facility]    Current Medications: Current Meds  Medication Sig  . acetaminophen (TYLENOL) 325 MG tablet Take 975 mg by mouth every 6 (six) hours as needed for headache.  . albuterol (PROVENTIL HFA;VENTOLIN HFA) 108 (90 Base) MCG/ACT inhaler Inhale 2 puffs into the lungs every 6 (six) hours as needed for wheezing or shortness of breath.  . carvedilol (COREG) 25 MG tablet Take 25 mg by mouth 2 (two) times daily.  . cyclobenzaprine (FLEXERIL) 5 MG tablet Take 5 mg by mouth 2 (two) times daily.  . fluticasone (FLONASE) 50 MCG/ACT nasal spray Place 1 spray into both nostrils 2 (two) times daily as needed for allergies.   Marland Kitchen gabapentin (NEURONTIN) 300 MG capsule TAKE 1 CAPSULE (300 MG TOTAL) BY MOUTH AT BEDTIME.  . isosorbide mononitrate (IMDUR) 60 MG 24 hr tablet Take 60 mg by mouth daily.  Marland Kitchen lamoTRIgine (LAMICTAL) 100 MG tablet Take 1 tablet (100 mg total) by mouth 2 (two) times daily.  Marland Kitchen levothyroxine (SYNTHROID, LEVOTHROID) 50 MCG tablet Take 50 mcg by mouth daily.   Marland Kitchen LINZESS 290 MCG CAPS capsule Take 290 mcg by mouth daily.  Marland Kitchen loperamide (IMODIUM) 2 MG capsule Take 1 capsule (2 mg total) by mouth as needed for diarrhea or loose stools.  . mometasone-formoterol (DULERA) 100-5 MCG/ACT AERO Inhale 2 puffs into the  lungs 2 (two) times daily.  . montelukast (SINGULAIR) 10 MG tablet Take 10 mg by mouth daily as needed (for allergies).   . nitroGLYCERIN (NITRODUR - DOSED IN MG/24 HR) 0.2 mg/hr patch PLACE 1 PATCH ONTO THE SKIN DAILY.  . nitroGLYCERIN (NITROSTAT) 0.4 MG SL tablet Place 1 tablet (0.4 mg total) under the tongue every 5 (five) minutes as needed for chest pain.  . pantoprazole (PROTONIX) 40 MG tablet Take 1 tablet (40 mg total) by mouth 2 (two) times daily.  . promethazine (PHENERGAN) 12.5 MG tablet Take 1 tablet (12.5 mg total) by mouth every 6 (six) hours as needed for nausea or  vomiting.  Marland Kitchen spironolactone (ALDACTONE) 25 MG tablet Take 1 tablet (25 mg total) by mouth daily.  Marland Kitchen topiramate (TOPAMAX) 200 MG tablet Take 1 tablet (200 mg total) by mouth 2 (two) times daily.  . traZODone (DESYREL) 100 MG tablet Take 100 mg by mouth daily.  . Venlafaxine HCl 150 MG TB24 Take 150 mg by mouth daily.  . verapamil (CALAN-SR) 240 MG CR tablet Take 1 tablet (240 mg total) by mouth at bedtime.  Marland Kitchen losartan (COZAAR) 50 MG tablet Take 1 tablet (50 mg total) by mouth daily.     Allergies:   Amitriptyline; Amlodipine; Latex; Amantadines; Aspirin; Simvastatin; and Tape   Social History   Socioeconomic History  . Marital status: Single    Spouse name: Not on file  . Number of children: 2  . Years of education: college  . Highest education level: Associate degree: academic program  Occupational History  . Occupation: Disabled  Social Needs  . Financial resource strain: Not on file  . Food insecurity:    Worry: Not on file    Inability: Not on file  . Transportation needs:    Medical: Not on file    Non-medical: Not on file  Tobacco Use  . Smoking status: Never Smoker  . Smokeless tobacco: Never Used  Substance and Sexual Activity  . Alcohol use: Yes    Alcohol/week: 0.0 oz    Types: 1 - 3 Glasses of wine per week    Comment: occasional  . Drug use: No  . Sexual activity: Yes    Birth  control/protection: None  Lifestyle  . Physical activity:    Days per week: Not on file    Minutes per session: Not on file  . Stress: Not on file  Relationships  . Social connections:    Talks on phone: Not on file    Gets together: Not on file    Attends religious service: Not on file    Active member of club or organization: Not on file    Attends meetings of clubs or organizations: Not on file    Relationship status: Not on file  Other Topics Concern  . Not on file  Social History Narrative   Lives at home with her daughter.   Right-handed.   No caffeine per day.     Family History: The patient's family history includes Cancer in her other; Coronary artery disease in her maternal grandmother; Coronary artery disease (age of onset: 42) in her mother; Hypertension in her other; Kidney disease in her father; Other in her father; Stroke in her other. ROS:   Please see the history of present illness.     All other systems reviewed and are negative.  EKGs/Labs/Other Studies Reviewed:    The following studies were reviewed today:  Right heart cath 06/01/16: Normal right and left heart filling pressures, no pulmonary hypertension.  Normal cardiac output.   Echocardiogram 04/04/2016 Study Conclusions  - Left ventricle: The cavity size was normal. There was mild   concentric hypertrophy. Systolic function was normal. The   estimated ejection fraction was in the range of 60% to 65%. Wall   motion was normal; there were no regional wall motion   abnormalities. Features are consistent with a pseudonormal left   ventricular filling pattern, with concomitant abnormal relaxation   and increased filling pressure (grade 2 diastolic dysfunction). - Pulmonic valve: Poorly visualized. There was trivial   regurgitation. - Pulmonary arteries: Systolic pressure could not be accurately  estimated.  Lexiscan myoview 04/04/2016  Ejection fraction 49% calculated with septal wall  hypokinesis (may be artifactually lowered due to study quality)  There are no obvious perfusion defects consistent with ischemia. Left ventricular hypertrophy may be present.  This is a low risk study. No ischemia identified.  34 Hr Holter 08/2014 No arrhythmia; rare PVC/PACs  Echo 08/28/14 Mild LVH, EF 55-60, Gr 1 DD, trivial MR, mild LAE  Myoview 02/17/14 [facility]) Lexiscan - No ischemia, EF 70%  Echo 09/30/12 Mild LVH. EF 55-65%. No RWMA, Gr 2 DD, trivial AI   EKG:  EKG is ordered today.  The ekg ordered today demonstrates sinus tachycardia, 104 bpm, LVH with repolarization abnormality  Recent Labs: 11/29/2016: NT-Pro BNP 9 05/21/2017: ALT 29; TSH 4.010 08/09/2017: BUN 13; Creatinine, Ser 0.86; Hemoglobin 12.0; Platelets 231; Potassium 3.7; Sodium 139   Recent Lipid Panel    Component Value Date/Time   CHOL 200 (H) 05/21/2017 1111   TRIG 58 05/21/2017 1111   HDL 59 05/21/2017 1111   CHOLHDL 3.4 05/21/2017 1111   CHOLHDL 3 09/30/2012 0929   VLDL 11.2 09/30/2012 0929   LDLCALC 129 (H) 05/21/2017 1111    Physical Exam:    VS:  BP (!) 144/78   Pulse (!) 104   Ht 5' 5.5" (1.664 m)   Wt (!) 305 lb (138.3 kg)   BMI 49.98 kg/m     Wt Readings from Last 3 Encounters:  08/27/17 (!) 305 lb (138.3 kg)  07/26/17 (!) 307 lb 9.6 oz (139.5 kg)  06/12/17 (!) 304 lb (137.9 kg)     Physical Exam  Constitutional: She is oriented to person, place, and time. She appears well-developed.  Obese  HENT:  Head: Normocephalic and atraumatic.  Neck: Normal range of motion. No JVD present.  Difficult to assess JVD due to neck size.  Cardiovascular: Normal rate, regular rhythm, normal heart sounds and intact distal pulses. Exam reveals no gallop and no friction rub.  No murmur heard. Pulmonary/Chest: Breath sounds normal. No respiratory distress. She has no wheezes. She has no rales.  Abdominal: Soft. Bowel sounds are normal.  Musculoskeletal: Normal range of motion. She exhibits edema.    Trace bilateral lower leg edema.   Neurological: She is alert and oriented to person, place, and time.  Skin: Skin is warm and dry.  Psychiatric: She has a normal mood and affect. Her behavior is normal. Thought content normal.    ASSESSMENT:    1. SOB (shortness of breath)   2. Acute on chronic diastolic (congestive) heart failure ([place])   3. HTN, goal below 130/80   4. Morbid obesity ([place])   5. OSA (obstructive sleep apnea)    PLAN:    In order of problems listed above:  Acute on chronic Diastolic heart failure: Grade 2 diastolic dysfunction on echocardiogram with normal LVEF, 03/2016.  Has been on Lasix as needed and Spironolactone.  2 week history of progressive increase in DOE, orthopnea and edema. Has taken lasix 40 mg daily for last 5 days with improvement, but not yet back to baseline. Will check BMet and BNP.  Instructed to take 80 mg of Lasix daily for 3 days then reduce to 40 mg every day.  Will recheck Bmet in a week. Encouraged low-sodium diet, fluid restriction to 1500 mL/day, daily weights, elevating legs and use of compression stockings.   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  Continues on isosorbide, nitro patch, verapamil, and carvedilol. Has had chest tightness with  increase in fluid retention, now improving with improve fluid status.   Hypertension: BP moderately controlled. Has not taken losartan in at least a week due to recall. Will stop losartan and start lisinopril 10 mg daily.   Morbid obesity: States lost 20 pounds last month while juicing, but has gained it back. Is being worked up for wt loss surgery.    OSA: On CPAP and reports consistency. Followed by Dr. Radford Pax.     Medication Adjustments/Labs and Tests Ordered: Current medicines are reviewed at length with the patient today.  Concerns regarding medicines are outlined above. Labs and tests ordered and medication changes are outlined in the patient instructions below:   Patient Instructions  Medication Instructions:  1) DISCONTINUE Losartan 2) START Lisinopril 10mg  once daily 3) START Furosemide- take 80mg  (2 tablets) once daily for 3 days and then decrease to 40mg  (1 tablet) once daily.  Labwork: BMET and Pro BNP today  Your physician recommends that you return for lab work in: 10 days (BMET)   Testing/Procedures: None  Follow-Up: Your physician recommends that you schedule a follow-up appointment in: 1 month with Dr. Meda Coffee or Pecolia Ades, NP.   Any Other Special Instructions Will Be Listed Below (If Applicable).  Monitor your weight daily.  You will need to weigh at the same time each day in the same amount of clothing.  Please let our office know if you gain 3 pounds or more overnight or 5 pounds or more in a week.  Please monitor your fluids and only take in 6-8 cups per day.  Be sure you are elevating your legs and wear compression stockings during the day when you are up and moving around.  Also follow a 2 gram sodium diet.     Low-Sodium Eating Plan Sodium, which is an element that makes up salt, helps you maintain a healthy balance of fluids in your body. Too much sodium can increase your blood pressure and cause fluid and waste to be held in your body. Your health care provider or dietitian may recommend following this plan if you have high blood pressure (hypertension), kidney disease, liver disease, or heart failure. Eating less sodium can help lower your blood pressure, reduce swelling, and protect your heart, liver, and kidneys. What are tips for following this plan? General guidelines  Most people on this plan should limit their sodium intake to 1,500-2,000 mg (milligrams) of sodium each day. Reading food labels  The Nutrition Facts label lists the amount of sodium in one serving of the food. If you eat more than one serving, you must multiply the listed amount of sodium by the number of servings.  Choose foods with less than 140  mg of sodium per serving.  Avoid foods with 300 mg of sodium or more per serving. Shopping  Look for lower-sodium products, often labeled as "low-sodium" or "no salt added."  Always check the sodium content even if foods are labeled as "unsalted" or "no salt added".  Buy fresh foods. ? Avoid canned foods and premade or frozen meals. ? Avoid canned, cured, or processed meats  Buy breads that have less than 80 mg of sodium per slice. Cooking  Eat more home-cooked food and less restaurant, buffet, and fast food.  Avoid adding salt when cooking. Use salt-free seasonings or herbs instead of table salt or sea salt. Check with your health care provider or pharmacist before using salt substitutes.  Cook with plant-based oils, such as canola, sunflower, or olive oil. Meal  planning  When eating at a restaurant, ask that your food be prepared with less salt or no salt, if possible.  Avoid foods that contain MSG (monosodium glutamate). MSG is sometimes added to [place] food, bouillon, and some canned foods. What foods are recommended? The items listed may not be a complete list. Talk with your dietitian about what dietary choices are best for you. Grains Low-sodium cereals, including oats, puffed wheat and rice, and shredded wheat. Low-sodium crackers. Unsalted rice. Unsalted pasta. Low-sodium bread. Whole-grain breads and whole-grain pasta. Vegetables Fresh or frozen vegetables. "No salt added" canned vegetables. "No salt added" tomato sauce and paste. Low-sodium or reduced-sodium tomato and vegetable juice. Fruits Fresh, frozen, or canned fruit. Fruit juice. Meats and other protein foods Fresh or frozen (no salt added) meat, poultry, seafood, and fish. Low-sodium canned tuna and salmon. Unsalted nuts. Dried peas, beans, and lentils without added salt. Unsalted canned beans. Eggs. Unsalted nut butters. Dairy Milk. Soy milk. Cheese that is naturally low in sodium, such as ricotta cheese,  fresh mozzarella, or Swiss cheese Low-sodium or reduced-sodium cheese. Cream cheese. Yogurt. Fats and oils Unsalted butter. Unsalted margarine with no trans fat. Vegetable oils such as canola or olive oils. Seasonings and other foods Fresh and dried herbs and spices. Salt-free seasonings. Low-sodium mustard and ketchup. Sodium-free salad dressing. Sodium-free light mayonnaise. Fresh or refrigerated horseradish. Lemon juice. Vinegar. Homemade, reduced-sodium, or low-sodium soups. Unsalted popcorn and pretzels. Low-salt or salt-free chips. What foods are not recommended? The items listed may not be a complete list. Talk with your dietitian about what dietary choices are best for you. Grains Instant hot cereals. Bread stuffing, pancake, and biscuit mixes. Croutons. Seasoned rice or pasta mixes. Noodle soup cups. Boxed or frozen macaroni and cheese. Regular salted crackers. Self-rising flour. Vegetables Sauerkraut, pickled vegetables, and relishes. Olives. [place] fries. Onion rings. Regular canned vegetables (not low-sodium or reduced-sodium). Regular canned tomato sauce and paste (not low-sodium or reduced-sodium). Regular tomato and vegetable juice (not low-sodium or reduced-sodium). Frozen vegetables in sauces. Meats and other protein foods Meat or fish that is salted, canned, smoked, spiced, or pickled. Bacon, ham, sausage, hotdogs, corned beef, chipped beef, packaged lunch meats, salt pork, jerky, pickled herring, anchovies, regular canned tuna, sardines, salted nuts. Dairy Processed cheese and cheese spreads. Cheese curds. Blue cheese. Feta cheese. String cheese. Regular cottage cheese. Buttermilk. Canned milk. Fats and oils Salted butter. Regular margarine. Ghee. Bacon fat. Seasonings and other foods Onion salt, garlic salt, seasoned salt, table salt, and sea salt. Canned and packaged gravies. Worcestershire sauce. Tartar sauce. Barbecue sauce. Teriyaki sauce. Soy sauce, including  reduced-sodium. Steak sauce. Fish sauce. Oyster sauce. Cocktail sauce. Horseradish that you find on the shelf. Regular ketchup and mustard. Meat flavorings and tenderizers. Bouillon cubes. Hot sauce and Tabasco sauce. Premade or packaged marinades. Premade or packaged taco seasonings. Relishes. Regular salad dressings. Salsa. Potato and tortilla chips. Corn chips and puffs. Salted popcorn and pretzels. Canned or dried soups. Pizza. Frozen entrees and pot pies. Summary  Eating less sodium can help lower your blood pressure, reduce swelling, and protect your heart, liver, and kidneys.  Most people on this plan should limit their sodium intake to 1,500-2,000 mg (milligrams) of sodium each day.  Canned, boxed, and frozen foods are high in sodium. Restaurant foods, fast foods, and pizza are also very high in sodium. You also get sodium by adding salt to food.  Try to cook at home, eat more fresh fruits and vegetables, and eat less fast  food, canned, processed, or prepared foods. This information is not intended to replace advice given to you by your health care provider. Make sure you discuss any questions you have with your health care provider. Document Released: 11/11/2001 Document Revised: 05/15/2016 Document Reviewed: 05/15/2016 Elsevier Interactive Patient Education  [employer].    If you need a refill on your cardiac medications before your next appointment, please call your pharmacy.      Signed, Daune Perch, NP  08/27/2017 6:52 PM    [place]

## 2017-08-27 NOTE — Patient Instructions (Signed)
Medication Instructions:  1) DISCONTINUE Losartan 2) START Lisinopril 10mg  once daily 3) START Furosemide- take 80mg  (2 tablets) once daily for 3 days and then decrease to 40mg  (1 tablet) once daily.  Labwork: BMET and Pro BNP today  Your physician recommends that you return for lab work in: 10 days (BMET)   Testing/Procedures: None  Follow-Up: Your physician recommends that you schedule a follow-up appointment in: 1 month with Dr. Meda Coffee or Pecolia Ades, NP.   Any Other Special Instructions Will Be Listed Below (If Applicable).  Monitor your weight daily.  You will need to weigh at the same time each day in the same amount of clothing.  Please let our office know if you gain 3 pounds or more overnight or 5 pounds or more in a week.  Please monitor your fluids and only take in 6-8 cups per day.  Be sure you are elevating your legs and wear compression stockings during the day when you are up and moving around.  Also follow a 2 gram sodium diet.     Low-Sodium Eating Plan Sodium, which is an element that makes up salt, helps you maintain a healthy balance of fluids in your body. Too much sodium can increase your blood pressure and cause fluid and waste to be held in your body. Your health care provider or dietitian may recommend following this plan if you have high blood pressure (hypertension), kidney disease, liver disease, or heart failure. Eating less sodium can help lower your blood pressure, reduce swelling, and protect your heart, liver, and kidneys. What are tips for following this plan? General guidelines  Most people on this plan should limit their sodium intake to 1,500-2,000 mg (milligrams) of sodium each day. Reading food labels  The Nutrition Facts label lists the amount of sodium in one serving of the food. If you eat more than one serving, you must multiply the listed amount of sodium by the number of servings.  Choose foods with less than 140 mg of sodium per  serving.  Avoid foods with 300 mg of sodium or more per serving. Shopping  Look for lower-sodium products, often labeled as "low-sodium" or "no salt added."  Always check the sodium content even if foods are labeled as "unsalted" or "no salt added".  Buy fresh foods. ? Avoid canned foods and premade or frozen meals. ? Avoid canned, cured, or processed meats  Buy breads that have less than 80 mg of sodium per slice. Cooking  Eat more home-cooked food and less restaurant, buffet, and fast food.  Avoid adding salt when cooking. Use salt-free seasonings or herbs instead of table salt or sea salt. Check with your health care provider or pharmacist before using salt substitutes.  Cook with plant-based oils, such as canola, sunflower, or olive oil. Meal planning  When eating at a restaurant, ask that your food be prepared with less salt or no salt, if possible.  Avoid foods that contain MSG (monosodium glutamate). MSG is sometimes added to [place] food, bouillon, and some canned foods. What foods are recommended? The items listed may not be a complete list. Talk with your dietitian about what dietary choices are best for you. Grains Low-sodium cereals, including oats, puffed wheat and rice, and shredded wheat. Low-sodium crackers. Unsalted rice. Unsalted pasta. Low-sodium bread. Whole-grain breads and whole-grain pasta. Vegetables Fresh or frozen vegetables. "No salt added" canned vegetables. "No salt added" tomato sauce and paste. Low-sodium or reduced-sodium tomato and vegetable juice. Fruits Fresh, frozen, or canned  fruit. Fruit juice. Meats and other protein foods Fresh or frozen (no salt added) meat, poultry, seafood, and fish. Low-sodium canned tuna and salmon. Unsalted nuts. Dried peas, beans, and lentils without added salt. Unsalted canned beans. Eggs. Unsalted nut butters. Dairy Milk. Soy milk. Cheese that is naturally low in sodium, such as ricotta cheese, fresh mozzarella, or  Swiss cheese Low-sodium or reduced-sodium cheese. Cream cheese. Yogurt. Fats and oils Unsalted butter. Unsalted margarine with no trans fat. Vegetable oils such as canola or olive oils. Seasonings and other foods Fresh and dried herbs and spices. Salt-free seasonings. Low-sodium mustard and ketchup. Sodium-free salad dressing. Sodium-free light mayonnaise. Fresh or refrigerated horseradish. Lemon juice. Vinegar. Homemade, reduced-sodium, or low-sodium soups. Unsalted popcorn and pretzels. Low-salt or salt-free chips. What foods are not recommended? The items listed may not be a complete list. Talk with your dietitian about what dietary choices are best for you. Grains Instant hot cereals. Bread stuffing, pancake, and biscuit mixes. Croutons. Seasoned rice or pasta mixes. Noodle soup cups. Boxed or frozen macaroni and cheese. Regular salted crackers. Self-rising flour. Vegetables Sauerkraut, pickled vegetables, and relishes. Olives. [place] fries. Onion rings. Regular canned vegetables (not low-sodium or reduced-sodium). Regular canned tomato sauce and paste (not low-sodium or reduced-sodium). Regular tomato and vegetable juice (not low-sodium or reduced-sodium). Frozen vegetables in sauces. Meats and other protein foods Meat or fish that is salted, canned, smoked, spiced, or pickled. Bacon, ham, sausage, hotdogs, corned beef, chipped beef, packaged lunch meats, salt pork, jerky, pickled herring, anchovies, regular canned tuna, sardines, salted nuts. Dairy Processed cheese and cheese spreads. Cheese curds. Blue cheese. Feta cheese. String cheese. Regular cottage cheese. Buttermilk. Canned milk. Fats and oils Salted butter. Regular margarine. Ghee. Bacon fat. Seasonings and other foods Onion salt, garlic salt, seasoned salt, table salt, and sea salt. Canned and packaged gravies. Worcestershire sauce. Tartar sauce. Barbecue sauce. Teriyaki sauce. Soy sauce, including reduced-sodium. Steak sauce. Fish  sauce. Oyster sauce. Cocktail sauce. Horseradish that you find on the shelf. Regular ketchup and mustard. Meat flavorings and tenderizers. Bouillon cubes. Hot sauce and Tabasco sauce. Premade or packaged marinades. Premade or packaged taco seasonings. Relishes. Regular salad dressings. Salsa. Potato and tortilla chips. Corn chips and puffs. Salted popcorn and pretzels. Canned or dried soups. Pizza. Frozen entrees and pot pies. Summary  Eating less sodium can help lower your blood pressure, reduce swelling, and protect your heart, liver, and kidneys.  Most people on this plan should limit their sodium intake to 1,500-2,000 mg (milligrams) of sodium each day.  Canned, boxed, and frozen foods are high in sodium. Restaurant foods, fast foods, and pizza are also very high in sodium. You also get sodium by adding salt to food.  Try to cook at home, eat more fresh fruits and vegetables, and eat less fast food, canned, processed, or prepared foods. This information is not intended to replace advice given to you by your health care provider. Make sure you discuss any questions you have with your health care provider. Document Released: 11/11/2001 Document Revised: 05/15/2016 Document Reviewed: 05/15/2016 Elsevier Interactive Patient Education  [employer].    If you need a refill on your cardiac medications before your next appointment, please call your pharmacy.

## 2017-08-28 ENCOUNTER — Telehealth: Payer: Self-pay | Admitting: Cardiology

## 2017-08-28 LAB — PRO B NATRIURETIC PEPTIDE: NT-Pro BNP: 29 pg/mL (ref 0–249)

## 2017-08-28 LAB — BASIC METABOLIC PANEL
BUN/Creatinine Ratio: 17 (ref 9–23)
BUN: 15 mg/dL (ref 6–24)
CO2: 23 mmol/L (ref 20–29)
Calcium: 9.1 mg/dL (ref 8.7–10.2)
Chloride: 107 mmol/L — ABNORMAL HIGH (ref 96–106)
Creatinine, Ser: 0.87 mg/dL (ref 0.57–1.00)
GFR calc Af Amer: 88 mL/min/{1.73_m2} (ref >59)
GFR calc non Af Amer: 76 mL/min/{1.73_m2} (ref >59)
Glucose: 88 mg/dL (ref 65–99)
Potassium: 4.2 mmol/L (ref 3.5–5.2)
Sodium: 143 mmol/L (ref 134–144)

## 2017-08-28 NOTE — Addendum Note (Signed)
Addended by: Loren Racer on: 08/28/2017 10:44 AM   Modules accepted: Orders

## 2017-08-28 NOTE — Telephone Encounter (Signed)
New Message:    Pt needs a note for work from her appt on yesterday

## 2017-08-29 NOTE — Telephone Encounter (Signed)
Left a message for the pt to call back.  

## 2017-08-31 DIAGNOSIS — R269 Unspecified abnormalities of gait and mobility: Secondary | ICD-10-CM | POA: Diagnosis not present

## 2017-08-31 DIAGNOSIS — G4733 Obstructive sleep apnea (adult) (pediatric): Secondary | ICD-10-CM | POA: Diagnosis not present

## 2017-09-03 DIAGNOSIS — I5033 Acute on chronic diastolic (congestive) heart failure: Secondary | ICD-10-CM | POA: Diagnosis not present

## 2017-09-03 NOTE — Addendum Note (Signed)
Addended by: Eulis Foster on: 09/03/2017 04:28 PM   Modules accepted: Orders

## 2017-09-04 LAB — BASIC METABOLIC PANEL
BUN/Creatinine Ratio: 18 (ref 9–23)
BUN: 14 mg/dL (ref 6–24)
CO2: 16 mmol/L — ABNORMAL LOW (ref 20–29)
Calcium: 9.2 mg/dL (ref 8.7–10.2)
Chloride: 110 mmol/L — ABNORMAL HIGH (ref 96–106)
Creatinine, Ser: 0.78 mg/dL (ref 0.57–1.00)
GFR calc Af Amer: 100 mL/min/{1.73_m2} (ref >59)
GFR calc non Af Amer: 87 mL/min/{1.73_m2} (ref >59)
Glucose: 100 mg/dL — ABNORMAL HIGH (ref 65–99)
Potassium: 4.2 mmol/L (ref 3.5–5.2)
Sodium: 145 mmol/L — ABNORMAL HIGH (ref 134–144)

## 2017-09-07 ENCOUNTER — Other Ambulatory Visit: Payer: Medicare Other

## 2017-09-08 ENCOUNTER — Other Ambulatory Visit: Payer: Self-pay | Admitting: Cardiology

## 2017-09-08 DIAGNOSIS — R0609 Other forms of dyspnea: Secondary | ICD-10-CM

## 2017-09-08 DIAGNOSIS — R0600 Dyspnea, unspecified: Secondary | ICD-10-CM

## 2017-09-08 DIAGNOSIS — I5033 Acute on chronic diastolic (congestive) heart failure: Secondary | ICD-10-CM

## 2017-09-08 DIAGNOSIS — I5032 Chronic diastolic (congestive) heart failure: Secondary | ICD-10-CM

## 2017-09-08 DIAGNOSIS — I208 Other forms of angina pectoris: Secondary | ICD-10-CM

## 2017-09-08 DIAGNOSIS — I110 Hypertensive heart disease with heart failure: Secondary | ICD-10-CM

## 2017-09-19 DIAGNOSIS — Z713 Dietary counseling and surveillance: Secondary | ICD-10-CM | POA: Diagnosis not present

## 2017-09-19 DIAGNOSIS — Z6841 Body Mass Index (BMI) 40.0 and over, adult: Secondary | ICD-10-CM | POA: Diagnosis not present

## 2017-09-24 ENCOUNTER — Ambulatory Visit: Payer: Medicare Other | Admitting: Cardiology

## 2017-09-24 NOTE — Progress Notes (Deleted)
Cardiology Office Note:    Date:  09/24/2017   ID:  [name], DOB [birth year], MRN [id number]  PCP:  Katherine Roan, MD  Cardiologist:  Ena Dawley, MD  Referring MD: Katherine Roan, MD   No chief complaint on file. ***  History of Present Illness:    [name] is a 52 y.o. female with a past medical history significant for history of chest pain and shortness of breath with prior negative caths, migraines, asthma, seizure disorder with vagal nerve stimulator and hypertension.  She has had chest pain chronically over several years.  She was noted to have a reportedly normal cardiac cath in [place] in 2008.  In 2012 she was noted to have ST depression on exercise ECG however Myoview showed no evidence of ischemia and normal EF.  Given her ongoing chest pain and abnormal ECG she underwent coronary CT angiogram.  It was a difficult study as they were unable to get her heart rate into the ideal range, however, she had no coronary calcium and no definite stenosis.  In 2013 in [place] she presented to the ED with exertional chest pain and had a mildly elevated troponin.  She underwent cardiac catheterization that showed no angiographic CAD.  Echocardiogram showed normal EF.  I saw her in the office on 08/27/17 at which time she was fluid overloaded with increased lower leg edema and dyspnea on exertion and orthopnea which had decreased some with increased daily Lasix (had been prn only) but was not back to her baseline.  She admitted to some dietary sodium indiscretion.  She was advised to increase her Lasix to 80 mg daily for 3 days then continue 40 mg daily.  He was also advised on low-sodium diet and 1500 mL daily fluid restriction, elevating her legs and using impression stockings.  I checked a basic metabolic panel which showed normal renal function and potassium and a BNP which was not elevated.  Also labs 1 week after diuresis were normal.  [name] is here today for follow  up of fluid overload.   _______________________________________  Lower extremity edema and dyspnea on exertion: BNP was not elevated.  Renal function was normal.  She was given Lasix 80 mg x 3 days with increase in her maintenance diuretic to Lasix 40 mg daily (was previously on an as-needed basis).  BNP was normal. Continue Spironolactone   Chronic diastolic CHF: On Spironolactone and Lasix, lisinopril and carvedilol and verapamil  Hypertension: Patient had been on losartan but this was recalled, switched to lisinopril at last visit.   Past Medical History:  Diagnosis Date  . Asthma   . Biliary dyskinesia    a. s/p cholecystectomy.  . 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 Abnl stress [place] 2008, f/u cath reportedly nl. b. ETT-Myoview 04/2011 - EKG changes but normal perfusion. Cor CT - no coronary calcium, no definite stenosis though mRCA not fully evaluated. c. 10/2011 - tn elevated in [place], LHC without CAD. Started on Ranexa, anti-anginals ?microvascular dz but later stopped while in hospital on abx.  . Complication of anesthesia    hard to wake up-had to be reminded to breath  . GERD (gastroesophageal reflux disease)    a. Severe.  [place] HTN (hypertension)   . Hx of cardiovascular stress test    Lex Myoview 8/14:  Normal, EF 74%  . Hx of echocardiogram    Echo 3/16:  Mild LVH, EF 55-60%, Gr 1 DD,  trivial MR, mild LAE, normal RVF  . Hypothyroid   . MI (myocardial infarction) ([place])   . Migraine   . Morbid obesity ([place]) 07/26/2017  . MRSA infection    a. After vagal nerve stimulator at [facility] - surgical site MRSA infection, PICC placed.  . Obesity   . OSA (obstructive sleep apnea) 07/26/2017  . Palpitations    a. 08/2014: 48 hour holter with 2 PVCs otherwise normal.  . Seizure disorder ([place])    a. since childhood. b. s/p vagal nerve stimulator at [facility].  . Seizures ([place])     Past Surgical History:  Procedure Laterality Date  . [place] N/A 06/01/2016   Procedure: Right Heart Cath;  Surgeon: Larey Dresser, MD;  Location: [place] [facility];  Service: Cardiovascular;  Laterality: N/A;  . CESAREAN SECTION     placement of vagal nerve stimulator.  . CHOLECYSTECTOMY N/A 01/24/2013   Procedure: LAPAROSCOPIC CHOLECYSTECTOMY;  Surgeon: Harl Bowie, MD;  Location: [place];  Service: General;  Laterality: N/A;  . IMPLANTATION VAGAL NERVE STIMULATOR  [phone]   battery chg-[facility]    Current Medications: No outpatient medications have been marked as taking for the 09/24/17 encounter (Appointment) with Daune Perch, NP.     Allergies:   Amitriptyline; Amlodipine; Latex; Amantadines; Aspirin; Simvastatin; and Tape   Social History   Socioeconomic History  . Marital status: Single    Spouse name: Not on file  . Number of children: 2  . Years of education: college  . Highest education level: Associate degree: academic program  Occupational History  . Occupation: Disabled  Social Needs  . Financial resource strain: Not on file  . Food insecurity:    Worry: Not on file    Inability: Not on file  . Transportation needs:    Medical: Not on file    Non-medical: Not on file  Tobacco Use  . Smoking status: Never Smoker  . Smokeless tobacco: Never Used  Substance and Sexual Activity  . Alcohol use: Yes    Alcohol/week: 0.0 oz    Types: 1 - 3 Glasses of wine per week    Comment: occasional  . Drug use: No  . Sexual activity: Yes    Birth control/protection: None  Lifestyle  . Physical activity:    Days per week: Not on file    Minutes per session: Not on file  . Stress: Not on file  Relationships  . Social connections:    Talks on phone: Not on file    Gets together: Not on file    Attends religious service: Not on file    Active member of club or organization: Not on file    Attends meetings of clubs or organizations: Not on file     Relationship status: Not on file  Other Topics Concern  . Not on file  Social History Narrative   Lives at home with her daughter.   Right-handed.   No caffeine per day.     Family History: The patient's ***family history includes Cancer in her other; Coronary artery disease in her maternal grandmother; Coronary artery disease (age of onset: 57) in her mother; Hypertension in her other; Kidney disease in her father; Other in her father; Stroke in her other. ROS:   Please see the history of present illness.    *** All other systems reviewed and are negative.  EKGs/Labs/Other Studies  Reviewed:    The following studies were reviewed today:  Right heart cath 06/01/16: Normal right and left heart filling pressures, no pulmonary hypertension. Normal cardiac output.   Echocardiogram 04/04/2016 Study Conclusions  - Left ventricle: The cavity size was normal. There was mild concentric hypertrophy. Systolic function was normal. The estimated ejection fraction was in the range of 60% to 65%. Wall motion was normal; there were no regional wall motion abnormalities. Features are consistent with a pseudonormal left ventricular filling pattern, with concomitant abnormal relaxation and increased filling pressure (grade 2 diastolic dysfunction). - Pulmonic valve: Poorly visualized. There was trivial regurgitation. - Pulmonary arteries: Systolic pressure could not be accurately estimated.  Lexiscan myoview 04/04/2016  Ejection fraction 49% calculated with septal wall hypokinesis (may be artifactually lowered due to study quality)  There are no obvious perfusion defects consistent with ischemia. Left ventricular hypertrophy may be present.  This is a low risk study. No ischemia identified.  57 Hr Holter 08/2014 No arrhythmia; rare PVC/PACs  Echo 08/28/14 Mild LVH, EF 55-60, Gr 1 DD, trivial MR, mild LAE  Myoview 02/17/14 [facility]) Lexiscan - No ischemia, EF  70%  Echo 09/30/12 Mild LVH. EF 55-65%. No RWMA, Gr 2 DD, trivial AI   EKG:  EKG is *** ordered today.  The ekg ordered today demonstrates ***  Recent Labs: 05/21/2017: ALT 29; TSH 4.010 08/09/2017: Hemoglobin 12.0; Platelets 231 08/27/2017: NT-Pro BNP 29 09/03/2017: BUN 14; Creatinine, Ser 0.78; Potassium 4.2; Sodium 145   Recent Lipid Panel    Component Value Date/Time   CHOL 200 (H) 05/21/2017 1111   TRIG 58 05/21/2017 1111   HDL 59 05/21/2017 1111   CHOLHDL 3.4 05/21/2017 1111   CHOLHDL 3 09/30/2012 0929   VLDL 11.2 09/30/2012 0929   LDLCALC 129 (H) 05/21/2017 1111    Physical Exam:    VS:  There were no vitals taken for this visit.    Wt Readings from Last 3 Encounters:  08/27/17 (!) 305 lb (138.3 kg)  07/26/17 (!) 307 lb 9.6 oz (139.5 kg)  06/12/17 (!) 304 lb (137.9 kg)     Physical Exam***   ASSESSMENT:    No diagnosis found. PLAN:    In order of problems listed above:  1. ***   Medication Adjustments/Labs and Tests Ordered: Current medicines are reviewed at length with the patient today.  Concerns regarding medicines are outlined above. Labs and tests ordered and medication changes are outlined in the patient instructions below:  There are no Patient Instructions on file for this visit.   Signed, Daune Perch, NP  09/24/2017 4:50 AM    [place]

## 2017-09-25 ENCOUNTER — Encounter: Payer: Self-pay | Admitting: Cardiology

## 2017-09-28 ENCOUNTER — Ambulatory Visit: Payer: Medicare Other | Admitting: Obstetrics

## 2017-10-03 ENCOUNTER — Encounter: Payer: Self-pay | Admitting: Cardiology

## 2017-10-15 DIAGNOSIS — Z01818 Encounter for other preprocedural examination: Secondary | ICD-10-CM | POA: Diagnosis not present

## 2017-10-15 DIAGNOSIS — M1990 Unspecified osteoarthritis, unspecified site: Secondary | ICD-10-CM | POA: Diagnosis not present

## 2017-10-15 DIAGNOSIS — G4730 Sleep apnea, unspecified: Secondary | ICD-10-CM | POA: Diagnosis not present

## 2017-10-16 ENCOUNTER — Ambulatory Visit: Payer: Medicare Other | Admitting: Obstetrics

## 2017-10-16 ENCOUNTER — Telehealth: Payer: Self-pay | Admitting: Neurology

## 2017-10-16 NOTE — Telephone Encounter (Signed)
Also received fax regarding this, I put on Dr. Rhea Belton desk to review. Dr. Krista Blue, would you like to speak with them, or would you like for me to contact them back for you? Patient's procedure is on 10/19/17.

## 2017-10-16 NOTE — Telephone Encounter (Signed)
RN Ronalee Belts has called re:VNS done by Dr Krista Blue.  RN Ronalee Belts states the Anesthesiologist has questions  Re: upcoming surgery on 05-17 RN Ronalee Belts can be called at [phone]

## 2017-10-17 NOTE — Telephone Encounter (Signed)
I was able to talk with anesthesiologist Dr. Moshe Cipro, patient is planning on to have elective bariatric surgery, anesthesiology is concerned about her current vagal nerve stimulation,  Reviewing chart, it was placed by [facility] epileptic neurologist Dr. Jacklynn Ganong, most recent battery replacement was on May 22, 2012,  During her only visit with [place] in January 2019, she indicated, she desires to continue her epilepsy management  with Dr. Joesph July at [facility],   EMG nerve conduction study on July 13, 2017 for evaluation of her bilateral hands paresthesia consistent with bilateral moderate carpal tunnel syndromes,  No follow-up visit is scheduled with me,  I also provided contact information for vagal nerve stimulation Therapist, sports

## 2017-10-17 NOTE — Telephone Encounter (Signed)
I have called back, left message for Ronalee Belts to call back.  [phone]

## 2017-10-19 DIAGNOSIS — Z87898 Personal history of other specified conditions: Secondary | ICD-10-CM

## 2017-10-19 DIAGNOSIS — R748 Abnormal levels of other serum enzymes: Secondary | ICD-10-CM | POA: Diagnosis not present

## 2017-10-19 DIAGNOSIS — Z6841 Body Mass Index (BMI) 40.0 and over, adult: Secondary | ICD-10-CM | POA: Diagnosis not present

## 2017-10-19 DIAGNOSIS — I951 Orthostatic hypotension: Secondary | ICD-10-CM | POA: Diagnosis not present

## 2017-10-19 DIAGNOSIS — J309 Allergic rhinitis, unspecified: Secondary | ICD-10-CM | POA: Diagnosis not present

## 2017-10-19 DIAGNOSIS — G8929 Other chronic pain: Secondary | ICD-10-CM | POA: Diagnosis not present

## 2017-10-19 DIAGNOSIS — R55 Syncope and collapse: Secondary | ICD-10-CM | POA: Diagnosis not present

## 2017-10-19 DIAGNOSIS — I959 Hypotension, unspecified: Secondary | ICD-10-CM | POA: Diagnosis not present

## 2017-10-19 DIAGNOSIS — E860 Dehydration: Secondary | ICD-10-CM | POA: Diagnosis not present

## 2017-10-19 DIAGNOSIS — I110 Hypertensive heart disease with heart failure: Secondary | ICD-10-CM | POA: Diagnosis not present

## 2017-10-19 DIAGNOSIS — E785 Hyperlipidemia, unspecified: Secondary | ICD-10-CM | POA: Diagnosis not present

## 2017-10-19 DIAGNOSIS — R0609 Other forms of dyspnea: Secondary | ICD-10-CM | POA: Diagnosis not present

## 2017-10-19 DIAGNOSIS — R0789 Other chest pain: Secondary | ICD-10-CM | POA: Diagnosis not present

## 2017-10-19 DIAGNOSIS — I2510 Atherosclerotic heart disease of native coronary artery without angina pectoris: Secondary | ICD-10-CM | POA: Diagnosis not present

## 2017-10-19 DIAGNOSIS — G4733 Obstructive sleep apnea (adult) (pediatric): Secondary | ICD-10-CM | POA: Diagnosis not present

## 2017-10-19 DIAGNOSIS — J4550 Severe persistent asthma, uncomplicated: Secondary | ICD-10-CM | POA: Diagnosis not present

## 2017-10-19 DIAGNOSIS — I5032 Chronic diastolic (congestive) heart failure: Secondary | ICD-10-CM | POA: Diagnosis not present

## 2017-10-19 DIAGNOSIS — E039 Hypothyroidism, unspecified: Secondary | ICD-10-CM | POA: Diagnosis not present

## 2017-10-19 DIAGNOSIS — E876 Hypokalemia: Secondary | ICD-10-CM | POA: Diagnosis not present

## 2017-10-19 DIAGNOSIS — K219 Gastro-esophageal reflux disease without esophagitis: Secondary | ICD-10-CM | POA: Diagnosis not present

## 2017-10-19 HISTORY — DX: Personal history of other specified conditions: Z87.898

## 2017-10-20 DIAGNOSIS — I110 Hypertensive heart disease with heart failure: Secondary | ICD-10-CM | POA: Diagnosis not present

## 2017-10-20 DIAGNOSIS — E860 Dehydration: Secondary | ICD-10-CM | POA: Diagnosis not present

## 2017-10-20 DIAGNOSIS — Z79899 Other long term (current) drug therapy: Secondary | ICD-10-CM | POA: Diagnosis not present

## 2017-10-20 DIAGNOSIS — Z8669 Personal history of other diseases of the nervous system and sense organs: Secondary | ICD-10-CM | POA: Diagnosis not present

## 2017-10-20 DIAGNOSIS — I2510 Atherosclerotic heart disease of native coronary artery without angina pectoris: Secondary | ICD-10-CM | POA: Diagnosis not present

## 2017-10-20 DIAGNOSIS — J309 Allergic rhinitis, unspecified: Secondary | ICD-10-CM | POA: Diagnosis not present

## 2017-10-20 DIAGNOSIS — E876 Hypokalemia: Secondary | ICD-10-CM | POA: Diagnosis not present

## 2017-10-20 DIAGNOSIS — I951 Orthostatic hypotension: Secondary | ICD-10-CM | POA: Diagnosis not present

## 2017-10-20 DIAGNOSIS — R55 Syncope and collapse: Secondary | ICD-10-CM | POA: Diagnosis not present

## 2017-10-20 DIAGNOSIS — Z6841 Body Mass Index (BMI) 40.0 and over, adult: Secondary | ICD-10-CM | POA: Diagnosis not present

## 2017-10-20 DIAGNOSIS — I5032 Chronic diastolic (congestive) heart failure: Secondary | ICD-10-CM | POA: Diagnosis not present

## 2017-10-20 DIAGNOSIS — Z9989 Dependence on other enabling machines and devices: Secondary | ICD-10-CM | POA: Diagnosis not present

## 2017-10-20 DIAGNOSIS — R748 Abnormal levels of other serum enzymes: Secondary | ICD-10-CM | POA: Diagnosis not present

## 2017-10-20 DIAGNOSIS — E039 Hypothyroidism, unspecified: Secondary | ICD-10-CM | POA: Diagnosis not present

## 2017-10-20 DIAGNOSIS — G8929 Other chronic pain: Secondary | ICD-10-CM | POA: Diagnosis not present

## 2017-10-20 DIAGNOSIS — I10 Essential (primary) hypertension: Secondary | ICD-10-CM | POA: Diagnosis not present

## 2017-10-20 DIAGNOSIS — K219 Gastro-esophageal reflux disease without esophagitis: Secondary | ICD-10-CM | POA: Diagnosis not present

## 2017-10-20 DIAGNOSIS — G4733 Obstructive sleep apnea (adult) (pediatric): Secondary | ICD-10-CM | POA: Diagnosis not present

## 2017-10-20 DIAGNOSIS — I959 Hypotension, unspecified: Secondary | ICD-10-CM | POA: Diagnosis not present

## 2017-10-20 DIAGNOSIS — J4550 Severe persistent asthma, uncomplicated: Secondary | ICD-10-CM | POA: Diagnosis not present

## 2017-10-20 DIAGNOSIS — E785 Hyperlipidemia, unspecified: Secondary | ICD-10-CM | POA: Diagnosis not present

## 2017-10-26 DIAGNOSIS — Z6841 Body Mass Index (BMI) 40.0 and over, adult: Secondary | ICD-10-CM | POA: Diagnosis not present

## 2017-10-26 DIAGNOSIS — I5032 Chronic diastolic (congestive) heart failure: Secondary | ICD-10-CM | POA: Diagnosis not present

## 2017-10-26 DIAGNOSIS — E559 Vitamin D deficiency, unspecified: Secondary | ICD-10-CM | POA: Diagnosis not present

## 2017-10-26 DIAGNOSIS — G4733 Obstructive sleep apnea (adult) (pediatric): Secondary | ICD-10-CM | POA: Diagnosis not present

## 2017-10-26 DIAGNOSIS — I959 Hypotension, unspecified: Secondary | ICD-10-CM | POA: Diagnosis not present

## 2017-10-26 DIAGNOSIS — I110 Hypertensive heart disease with heart failure: Secondary | ICD-10-CM | POA: Diagnosis not present

## 2017-10-26 DIAGNOSIS — I10 Essential (primary) hypertension: Secondary | ICD-10-CM | POA: Diagnosis not present

## 2017-10-26 DIAGNOSIS — F419 Anxiety disorder, unspecified: Secondary | ICD-10-CM | POA: Diagnosis not present

## 2017-10-26 DIAGNOSIS — I2510 Atherosclerotic heart disease of native coronary artery without angina pectoris: Secondary | ICD-10-CM | POA: Diagnosis not present

## 2017-10-26 DIAGNOSIS — G4730 Sleep apnea, unspecified: Secondary | ICD-10-CM | POA: Diagnosis not present

## 2017-10-26 DIAGNOSIS — K2950 Unspecified chronic gastritis without bleeding: Secondary | ICD-10-CM | POA: Diagnosis not present

## 2017-10-26 DIAGNOSIS — M5136 Other intervertebral disc degeneration, lumbar region: Secondary | ICD-10-CM | POA: Diagnosis not present

## 2017-10-26 DIAGNOSIS — E039 Hypothyroidism, unspecified: Secondary | ICD-10-CM | POA: Diagnosis not present

## 2017-10-26 DIAGNOSIS — E785 Hyperlipidemia, unspecified: Secondary | ICD-10-CM | POA: Diagnosis not present

## 2017-10-26 DIAGNOSIS — J45909 Unspecified asthma, uncomplicated: Secondary | ICD-10-CM | POA: Diagnosis not present

## 2017-10-26 DIAGNOSIS — R55 Syncope and collapse: Secondary | ICD-10-CM | POA: Diagnosis not present

## 2017-10-26 DIAGNOSIS — K219 Gastro-esophageal reflux disease without esophagitis: Secondary | ICD-10-CM | POA: Diagnosis not present

## 2017-10-26 DIAGNOSIS — I252 Old myocardial infarction: Secondary | ICD-10-CM | POA: Diagnosis not present

## 2017-11-14 ENCOUNTER — Ambulatory Visit: Payer: Medicare Other | Admitting: Cardiology

## 2017-11-14 DIAGNOSIS — Z9884 Bariatric surgery status: Secondary | ICD-10-CM | POA: Insufficient documentation

## 2017-11-14 HISTORY — DX: Bariatric surgery status: Z98.84

## 2017-11-14 NOTE — Progress Notes (Deleted)
Cardiology Office Note:    Date:  11/14/2017   ID:  [name], DOB 12/01/1965, MRN [id number]  PCP:  Katherine Roan, MD  Cardiologist:  Ena Dawley, MD  Referring MD: Katherine Roan, MD   No chief complaint on file. ***  History of Present Illness:    [name] is a 52 y.o. female with a past medical history significant for history of chest pain and shortness of breath with prior negative caths, migraines, asthma, seizure disorder with vagal nerve stimulator and hypertension.  She has had chest pain chronically over several years.  She was noted to have a reportedly normal cardiac cath in [place] in 2008.  In 2012 she was noted to have ST depression on exercise ECG however Myoview showed no evidence of ischemia and normal EF.  Given her ongoing chest pain and abnormal ECG she underwent coronary CT angiogram.  It was a difficult study as they were unable to get her heart rate into the ideal range, however, she had no coronary calcium and no definite stenosis.  In 2013 in [place] she presented to the ED with exertional chest pain and had a mildly elevated troponin.  She underwent cardiac catheterization that showed no angiographic CAD.  Echocardiogram showed normal EF.  Her most recent echocardiogram in 03/2016 showed normal EF of 60-65% and grade 2 diastolic dysfunction.  The right heart cath in 05/2016 showed normal right and left heart filling pressures.   She was seen in the office by me on 08/27/2017 with increasing dyspnea on exertion and orthopnea.  She had been previously only taking her Lasix once or twice a week.  She had increased her Lasix to 80 mg because the swelling was bad and she did have some improvement.  Her proBNP was 29.  Also she had been not been taking her losartan due to recall.  I switch the losartan to lisinopril 10 mg daily.  Today the patient is here for follow-up    ----------------------------------------------------------  Acute on  chronic Diastolic heart failure: Grade 2 diastolic dysfunction on echocardiogram with normal LVEF, 03/2016.  Right heart cath in 05/2016 showed normal right and left heart filling pressures.  Patient seen in March with increase in dyspnea on exertion.  She was given extra Lasix and advised to take Lasix 40 mg every day instead of as needed.   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  Continues on isosorbide, nitro patch, verapamil, and carvedilol.    Hypertension: In March switched from losartan to lisinopril due to recall.  OSA: On CPAP.  Followed by Dr. Radford Pax Morbid obesity:        Patient is being worked up for possible weight loss surgery.  Past Medical History:  Diagnosis Date  . Asthma   . Biliary dyskinesia    a. s/p cholecystectomy.  . 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 Abnl stress [place] 2008, f/u cath reportedly nl. b. ETT-Myoview 04/2011 - EKG changes but normal perfusion. Cor CT - no coronary calcium, no definite stenosis though mRCA not fully evaluated. c. 10/2011 - tn elevated in [place], LHC without CAD. Started on Ranexa, anti-anginals ?microvascular dz but later stopped while in hospital on abx.  . Complication of anesthesia    hard to wake up-had to be reminded to breath  . GERD (gastroesophageal reflux disease)    a. Severe.  Marland Kitchen HTN (hypertension)   . Hx of cardiovascular stress test  Lex Myoview 8/14:  Normal, EF 74%  . Hx of echocardiogram    Echo 3/16:  Mild LVH, EF 55-60%, Gr 1 DD, trivial MR, mild LAE, normal RVF  . Hypothyroid   . MI (myocardial infarction) ([place])   . Migraine   . Morbid obesity ([place]) 07/26/2017  . MRSA infection    a. After vagal nerve stimulator at [facility] - surgical site MRSA infection, PICC placed.  . Obesity   . OSA (obstructive sleep apnea) 07/26/2017  . Palpitations    a. 08/2014: 48 hour holter with 2 PVCs otherwise normal.  . Seizure disorder ([place])    a. since childhood. b. s/p  vagal nerve stimulator at [facility].  . Seizures ([place])     Past Surgical History:  Procedure Laterality Date  . [place] N/A 06/01/2016   Procedure: Right Heart Cath;  Surgeon: Larey Dresser, MD;  Location: [facility];  Service: Cardiovascular;  Laterality: N/A;  . CESAREAN SECTION     placement of vagal nerve stimulator.  . CHOLECYSTECTOMY N/A 01/24/2013   Procedure: LAPAROSCOPIC CHOLECYSTECTOMY;  Surgeon: Harl Bowie, MD;  Location: [facility];  Service: General;  Laterality: N/A;  . IMPLANTATION VAGAL NERVE STIMULATOR  [phone]   battery [facility]    Current Medications: No outpatient medications have been marked as taking for the 11/14/17 encounter (Appointment) with Daune Perch, NP.     Allergies:   Amitriptyline; Amlodipine; Latex; Amantadines; Aspirin; Simvastatin; and Tape   Social History   Socioeconomic History  . Marital status: Single    Spouse name: Not on file  . Number of children: 2  . Years of education: college  . Highest education level: Associate degree: academic program  Occupational History  . Occupation: Disabled  Social Needs  . Financial resource strain: Not on file  . Food insecurity:    Worry: Not on file    Inability: Not on file  . Transportation needs:    Medical: Not on file    Non-medical: Not on file  Tobacco Use  . Smoking status: Never Smoker  . Smokeless tobacco: Never Used  Substance and Sexual Activity  . Alcohol use: Yes    Alcohol/week: 0.0 oz    Types: 1 - 3 Glasses of wine per week    Comment: occasional  . Drug use: No  . Sexual activity: Yes    Birth control/protection: None  Lifestyle  . Physical activity:    Days per week: Not on file    Minutes per session: Not on file  . Stress: Not on file  Relationships  . Social connections:    Talks on phone: Not on file    Gets together: Not on file    Attends  religious service: Not on file    Active member of club or organization: Not on file    Attends meetings of clubs or organizations: Not on file    Relationship status: Not on file  Other Topics Concern  . Not on file  Social History Narrative   Lives at home with her daughter.   Right-handed.   No caffeine per day.     Family History: The patient's ***family history includes Cancer in her other; Coronary artery disease in her maternal grandmother; Coronary artery disease (age of onset: 14) in her mother; Hypertension in her other; Kidney disease in her father; Other in her father; Stroke in her  other. ROS:   Please see the history of present illness.    *** All other systems reviewed and are negative.  EKGs/Labs/Other Studies Reviewed:    The following studies were reviewed today:  Right heart cath 06/01/16: Normal right and left heart filling pressures, no pulmonary hypertension. Normal cardiac output.   Echocardiogram 04/04/2016 Study Conclusions  - Left ventricle: The cavity size was normal. There was mild concentric hypertrophy. Systolic function was normal. The estimated ejection fraction was in the range of 60% to 65%. Wall motion was normal; there were no regional wall motion abnormalities. Features are consistent with a pseudonormal left ventricular filling pattern, with concomitant abnormal relaxation and increased filling pressure (grade 2 diastolic dysfunction). - Pulmonic valve: Poorly visualized. There was trivial regurgitation. - Pulmonary arteries: Systolic pressure could not be accurately estimated.  Lexiscan myoview 04/04/2016  Ejection fraction 49% calculated with septal wall hypokinesis (may be artifactually lowered due to study quality)  There are no obvious perfusion defects consistent with ischemia. Left ventricular hypertrophy may be present.  This is a low risk study. No ischemia identified.  24 Hr Holter 08/2014 No  arrhythmia; rare PVC/PACs  Echo 08/28/14 Mild LVH, EF 55-60, Gr 1 DD, trivial MR, mild LAE  Myoview 02/17/14 [facility]) Lexiscan - No ischemia, EF 70%  Echo 09/30/12 Mild LVH. EF 55-65%. No RWMA, Gr 2 DD, trivial AI    EKG:  EKG is *** ordered today.  The ekg ordered today demonstrates ***  Recent Labs: 05/21/2017: ALT 29; TSH 4.010 08/09/2017: Hemoglobin 12.0; Platelets 231 08/27/2017: NT-Pro BNP 29 09/03/2017: BUN 14; Creatinine, Ser 0.78; Potassium 4.2; Sodium 145   Recent Lipid Panel    Component Value Date/Time   CHOL 200 (H) 05/21/2017 1111   TRIG 58 05/21/2017 1111   HDL 59 05/21/2017 1111   CHOLHDL 3.4 05/21/2017 1111   CHOLHDL 3 09/30/2012 0929   VLDL 11.2 09/30/2012 0929   LDLCALC 129 (H) 05/21/2017 1111    Physical Exam:    VS:  There were no vitals taken for this visit.    Wt Readings from Last 3 Encounters:  08/27/17 (!) 305 lb (138.3 kg)  07/26/17 (!) 307 lb 9.6 oz (139.5 kg)  06/12/17 (!) 304 lb (137.9 kg)     Physical Exam***   ASSESSMENT:    No diagnosis found. PLAN:    In order of problems listed above:  1. ***   Medication Adjustments/Labs and Tests Ordered: Current medicines are reviewed at length with the patient today.  Concerns regarding medicines are outlined above. Labs and tests ordered and medication changes are outlined in the patient instructions below:  There are no Patient Instructions on file for this visit.   Signed, Daune Perch, NP  11/14/2017 7:25 AM    [place]

## 2017-11-27 ENCOUNTER — Encounter: Payer: Self-pay | Admitting: Podiatry

## 2017-11-27 NOTE — Progress Notes (Signed)
Pt's medical records requested by [employer] were e-mailed to them per their request to the e-mail address of [email or web address].

## 2017-12-19 ENCOUNTER — Telehealth: Payer: Self-pay | Admitting: Internal Medicine

## 2017-12-19 ENCOUNTER — Encounter: Payer: Self-pay | Admitting: Internal Medicine

## 2017-12-19 ENCOUNTER — Ambulatory Visit (INDEPENDENT_AMBULATORY_CARE_PROVIDER_SITE_OTHER): Payer: Medicare Other | Admitting: Internal Medicine

## 2017-12-19 VITALS — BP 146/95 | HR 103 | Temp 98.2°F | Wt 248.9 lb

## 2017-12-19 DIAGNOSIS — K645 Perianal venous thrombosis: Secondary | ICD-10-CM | POA: Diagnosis not present

## 2017-12-19 DIAGNOSIS — I2510 Atherosclerotic heart disease of native coronary artery without angina pectoris: Secondary | ICD-10-CM | POA: Diagnosis not present

## 2017-12-19 DIAGNOSIS — I509 Heart failure, unspecified: Secondary | ICD-10-CM

## 2017-12-19 DIAGNOSIS — I110 Hypertensive heart disease with heart failure: Secondary | ICD-10-CM | POA: Diagnosis not present

## 2017-12-19 DIAGNOSIS — K6289 Other specified diseases of anus and rectum: Secondary | ICD-10-CM

## 2017-12-19 DIAGNOSIS — Z9884 Bariatric surgery status: Secondary | ICD-10-CM

## 2017-12-19 DIAGNOSIS — I252 Old myocardial infarction: Secondary | ICD-10-CM

## 2017-12-19 DIAGNOSIS — K219 Gastro-esophageal reflux disease without esophagitis: Secondary | ICD-10-CM

## 2017-12-19 HISTORY — DX: Other specified diseases of anus and rectum: K62.89

## 2017-12-19 MED ORDER — OXYCODONE-ACETAMINOPHEN 5-325 MG PO TABS: 1.0000 | ORAL_TABLET | Freq: Two times a day (BID) | ORAL | 0 refills | Status: DC | PRN

## 2017-12-19 MED ORDER — LIDOCAINE HCL URETHRAL/MUCOSAL 2 % EX GEL: 1.0000 "application " | CUTANEOUS | 0 refills | Status: DC | PRN

## 2017-12-19 MED ORDER — NITROGLYCERIN 0.4 % RE OINT: 1.0000 [in_us] | TOPICAL_OINTMENT | Freq: Two times a day (BID) | RECTAL | 3 refills | Status: DC

## 2017-12-19 NOTE — Telephone Encounter (Signed)
Patient called. Unfortunately, I don't believe there's any way around the extremely expensive nitroglycerin ointment. Patient was able to pick up the Percocet. It sounds like the lidocaine jelly needs a prior auth but I found an OTC lidocaine jelly (RectiCare) and instructed patient to buy that at [facility].   Francesco Runner, MD

## 2017-12-19 NOTE — Assessment & Plan Note (Signed)
New, Acute. Severe rectal pain and recent constipation. No improvement with typical hemorrhoid remedies. Constipation resolved with metamucil. External hemorrhoid appears thrombosed on exam but patient's story also concerning for annal fissure. Plan to treat for both and instruct patient to contact her GI doctor for possible surgical treatment.   - 2% lidocaine jelly - 0.4% nitroglycerin ointment  - Percocet

## 2017-12-19 NOTE — Patient Instructions (Addendum)
Ms. Flott,  I am sorry you are in so much pain. I have sent a prescription for Percocet, topical lidocaine jelly, and 0.4% topical nitroglycerin. Please take as prescribed. I also recommend reaching out to the GI doctor in [facility] who did your colonoscopy, as they may be able to surgical remove the hemorrhoid and/or anal fissure that is causing you all this pain.  On warning for the medicines I am prescribing: The topical nitroglycerin will have a small amount of systemic absorption which may interact with the nitroglycerin patch and tabs you already take. Just be aware of any lightheadedness, dizziness, or headaches. Please let us know if you experience any severe symptoms.   Best,  Dr. Donne Hazel

## 2017-12-19 NOTE — Telephone Encounter (Signed)
Pt came in today and the physician wrote her an cream, pt insurance will not cover $ 756.00, pls call pt or send something else to the pharmacy @ [facility] [address]; pt contact # [phone]  Pt also states the others medicine she got today- pharmacy says needs a prior authorization

## 2017-12-19 NOTE — Progress Notes (Signed)
   CC: hemorrhoids   HPI:  [name] is a 52 y.o. female with history of HTN, CAD, MI, CHF, GERD, recent bariatric surgery who presents today for evaluation of severe rectal pain. Duration of 1 one week. Began after a period of constipation. Severe pain at baseline and especially after bowel movements. Describes the pain as 10 out of 10 sharp, burning pain radiating from rectum to her entire body. Denies rectal bleeding. She has tried preparation-H ointment, wipes, and suppositories. She has also tried sitz baths, ice, heating pad. None of this provides relief. Metamucil has resolved her constipation. She reports history of rectal pain but never this severe. Last colonoscopy was 2016 at [facility] but results are unavailable in [place].     Denies n/v/d  Past Medical History:  Diagnosis Date  . Asthma   . Biliary dyskinesia    a. s/p cholecystectomy.  . 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 Abnl stress [place] 2008, f/u cath reportedly nl. b. ETT-Myoview 04/2011 - EKG changes but normal perfusion. Cor CT - no coronary calcium, no definite stenosis though mRCA not fully evaluated. c. 10/2011 - tn elevated in [place], LHC without CAD. Started on Ranexa, anti-anginals ?microvascular dz but later stopped while in hospital on abx.  . Complication of anesthesia    hard to wake up-had to be reminded to breath  . GERD (gastroesophageal reflux disease)    a. Severe.  Marland Kitchen HTN (hypertension)   . Hx of cardiovascular stress test    Lex Myoview 8/14:  Normal, EF 74%  . Hx of echocardiogram    Echo 3/16:  Mild LVH, EF 55-60%, Gr 1 DD, trivial MR, mild LAE, normal RVF  . Hypothyroid   . MI (myocardial infarction) ([place])   . Migraine   . Morbid obesity ([place]) 07/26/2017  . MRSA infection    a. After vagal nerve stimulator at [facility] - surgical site MRSA infection, PICC placed.  . Obesity   . OSA (obstructive sleep apnea) 07/26/2017  . Palpitations    a. 08/2014: 48 hour holter  with 2 PVCs otherwise normal.  . Seizure disorder ([place])    a. since childhood. b. s/p vagal nerve stimulator at [facility].  . Seizures ([place])     Physical Exam:  Vitals:   12/19/17 0955  BP: (!) 146/95  Pulse: (!) 103  Temp: 98.2 F (36.8 C)  TempSrc: Oral  SpO2: 99%  Weight: 248 lb 14.4 oz (112.9 kg)   Gen: Uncomfortable, in pain, laying on exam table.  CV: RRR, no murmurs Pulm: Normal effort Abd: Soft, NT, ND, normal BS.  Rectal: 2cm firm, tender, external hemorrhoid with adjacent skin tag. External hemorrhoid does not collapse with palpation. Pain with digital insertion. Scant brown mucous, Guaiac positive.    Assessment & Plan:   See Encounters Tab for problem based charting.  Patient seen with Dr. Evette Doffing

## 2017-12-20 NOTE — Progress Notes (Signed)
Internal Medicine Clinic Attending  I saw and evaluated the patient.  I personally confirmed the key portions of the history and exam documented by Dr. Donne Hazel and I reviewed pertinent patient test results.  The assessment, diagnosis, and plan were formulated together and I agree with the documentation in the resident's note.  Patient with new severe rectal pain, by history it sounds like spasm pain due to anal fissure. On exam there is a fleshy and firm nodule that is about 1x1 cm at the anus which is tender to palpation. This could be a skin tag or thrombosed external hemorrhoid. She will continue with stool softeners and sitz baths. Also add topical lidocaine and nitro for fissure treatment. She is already on nitro patch for heart disease, I gave her careful instructions about stopping the topical nitro if she has any lightheadedness or headache. Also gave 5 day supply of percocet for this acute pain. She should return to see her GI physician at [facility] for anoscopy if this does not resolve in the coming weeks.

## 2017-12-21 DIAGNOSIS — K5909 Other constipation: Secondary | ICD-10-CM | POA: Diagnosis not present

## 2017-12-25 ENCOUNTER — Telehealth: Payer: Self-pay | Admitting: *Deleted

## 2017-12-25 DIAGNOSIS — Z9884 Bariatric surgery status: Secondary | ICD-10-CM | POA: Diagnosis not present

## 2017-12-25 DIAGNOSIS — Z713 Dietary counseling and surveillance: Secondary | ICD-10-CM | POA: Diagnosis not present

## 2017-12-25 DIAGNOSIS — Z6841 Body Mass Index (BMI) 40.0 and over, adult: Secondary | ICD-10-CM | POA: Diagnosis not present

## 2017-12-25 NOTE — Telephone Encounter (Addendum)
Information was sent through CoverMyMeds for [place] for Lidocaine 2% Jelly.  Awaiting determination within 3 business days.  Sander Nephew, RN 12/25/2017 3:05 PM. Call to [facility] and [facility] [place] denied.  Medication was prescribed for a non FDA approved use .  Sander Nephew, RN 01/16/2018 11:31 AM.  May reapply again in 60 days if needed.  Sander Nephew, RN 01/16/2018 11:32 AM.

## 2017-12-26 DIAGNOSIS — R112 Nausea with vomiting, unspecified: Secondary | ICD-10-CM | POA: Diagnosis not present

## 2017-12-26 DIAGNOSIS — K828 Other specified diseases of gallbladder: Secondary | ICD-10-CM | POA: Diagnosis not present

## 2017-12-26 DIAGNOSIS — I110 Hypertensive heart disease with heart failure: Secondary | ICD-10-CM | POA: Diagnosis not present

## 2017-12-26 DIAGNOSIS — R109 Unspecified abdominal pain: Secondary | ICD-10-CM | POA: Diagnosis not present

## 2017-12-26 DIAGNOSIS — G4730 Sleep apnea, unspecified: Secondary | ICD-10-CM | POA: Diagnosis not present

## 2017-12-26 DIAGNOSIS — J45909 Unspecified asthma, uncomplicated: Secondary | ICD-10-CM | POA: Diagnosis not present

## 2017-12-26 DIAGNOSIS — Z9884 Bariatric surgery status: Secondary | ICD-10-CM | POA: Diagnosis not present

## 2017-12-26 DIAGNOSIS — I5032 Chronic diastolic (congestive) heart failure: Secondary | ICD-10-CM | POA: Diagnosis not present

## 2017-12-26 DIAGNOSIS — I252 Old myocardial infarction: Secondary | ICD-10-CM | POA: Diagnosis not present

## 2017-12-26 DIAGNOSIS — K210 Gastro-esophageal reflux disease with esophagitis: Secondary | ICD-10-CM | POA: Diagnosis not present

## 2017-12-26 DIAGNOSIS — E785 Hyperlipidemia, unspecified: Secondary | ICD-10-CM | POA: Diagnosis not present

## 2017-12-26 DIAGNOSIS — Z6841 Body Mass Index (BMI) 40.0 and over, adult: Secondary | ICD-10-CM | POA: Diagnosis not present

## 2017-12-26 DIAGNOSIS — R12 Heartburn: Secondary | ICD-10-CM | POA: Diagnosis not present

## 2017-12-26 DIAGNOSIS — E039 Hypothyroidism, unspecified: Secondary | ICD-10-CM | POA: Diagnosis not present

## 2017-12-26 DIAGNOSIS — I2510 Atherosclerotic heart disease of native coronary artery without angina pectoris: Secondary | ICD-10-CM | POA: Diagnosis not present

## 2018-01-01 ENCOUNTER — Other Ambulatory Visit: Payer: Self-pay

## 2018-01-01 ENCOUNTER — Encounter (HOSPITAL_COMMUNITY): Payer: Self-pay | Admitting: Emergency Medicine

## 2018-01-01 ENCOUNTER — Emergency Department (HOSPITAL_COMMUNITY): Payer: Medicare Other

## 2018-01-01 ENCOUNTER — Emergency Department (HOSPITAL_COMMUNITY)
Admission: EM | Admit: 2018-01-01 | Discharge: 2018-01-01 | Disposition: A | Discharge status: Home / Self Care | Payer: Medicare Other | Attending: Emergency Medicine | Admitting: Emergency Medicine

## 2018-01-01 DIAGNOSIS — R0902 Hypoxemia: Secondary | ICD-10-CM | POA: Diagnosis not present

## 2018-01-01 DIAGNOSIS — J45909 Unspecified asthma, uncomplicated: Secondary | ICD-10-CM | POA: Insufficient documentation

## 2018-01-01 DIAGNOSIS — R51 Headache: Secondary | ICD-10-CM | POA: Diagnosis not present

## 2018-01-01 DIAGNOSIS — R410 Disorientation, unspecified: Secondary | ICD-10-CM | POA: Diagnosis not present

## 2018-01-01 DIAGNOSIS — I110 Hypertensive heart disease with heart failure: Secondary | ICD-10-CM | POA: Diagnosis not present

## 2018-01-01 DIAGNOSIS — I5032 Chronic diastolic (congestive) heart failure: Secondary | ICD-10-CM | POA: Diagnosis not present

## 2018-01-01 DIAGNOSIS — E039 Hypothyroidism, unspecified: Secondary | ICD-10-CM | POA: Insufficient documentation

## 2018-01-01 DIAGNOSIS — R251 Tremor, unspecified: Secondary | ICD-10-CM | POA: Insufficient documentation

## 2018-01-01 DIAGNOSIS — Z79899 Other long term (current) drug therapy: Secondary | ICD-10-CM | POA: Insufficient documentation

## 2018-01-01 DIAGNOSIS — Z91040 Latex allergy status: Secondary | ICD-10-CM | POA: Insufficient documentation

## 2018-01-01 DIAGNOSIS — G459 Transient cerebral ischemic attack, unspecified: Secondary | ICD-10-CM | POA: Diagnosis not present

## 2018-01-01 DIAGNOSIS — I10 Essential (primary) hypertension: Secondary | ICD-10-CM | POA: Diagnosis not present

## 2018-01-01 LAB — COMPREHENSIVE METABOLIC PANEL
ALT: 34 U/L (ref 0–44)
AST: 23 U/L (ref 15–41)
Albumin: 3.4 g/dL — ABNORMAL LOW (ref 3.5–5.0)
Alkaline Phosphatase: 57 U/L (ref 38–126)
Anion gap: 8 (ref 5–15)
BUN: 6 mg/dL (ref 6–20)
CO2: 26 mmol/L (ref 22–32)
Calcium: 8.5 mg/dL — ABNORMAL LOW (ref 8.9–10.3)
Chloride: 108 mmol/L (ref 98–111)
Creatinine, Ser: 0.92 mg/dL (ref 0.44–1.00)
GFR calc Af Amer: >60 mL/min (ref >60)
GFR calc non Af Amer: >60 mL/min (ref >60)
Glucose, Bld: 99 mg/dL (ref 70–99)
Potassium: 2.7 mmol/L — CL (ref 3.5–5.1)
Sodium: 142 mmol/L (ref 135–145)
Total Bilirubin: 0.8 mg/dL (ref 0.3–1.2)
Total Protein: 6.6 g/dL (ref 6.5–8.1)

## 2018-01-01 LAB — CBC
HCT: 35.4 % — ABNORMAL LOW (ref 36.0–46.0)
Hemoglobin: 11.1 g/dL — ABNORMAL LOW (ref 12.0–15.0)
MCH: 26.7 pg (ref 26.0–34.0)
MCHC: 31.4 g/dL (ref 30.0–36.0)
MCV: 85.1 fL (ref 78.0–100.0)
Platelets: 196 10*3/uL (ref 150–400)
RBC: 4.16 MIL/uL (ref 3.87–5.11)
RDW: 16 % — ABNORMAL HIGH (ref 11.5–15.5)
WBC: 5.5 10*3/uL (ref 4.0–10.5)

## 2018-01-01 LAB — RAPID URINE DRUG SCREEN, HOSP PERFORMED
Amphetamines: NOT DETECTED
Barbiturates: NOT DETECTED
Benzodiazepines: NOT DETECTED
Cocaine: NOT DETECTED
Opiates: NOT DETECTED
Tetrahydrocannabinol: NOT DETECTED

## 2018-01-01 LAB — I-STAT CG4 LACTIC ACID, ED: Lactic Acid, Venous: 0.86 mmol/L (ref 0.5–1.9)

## 2018-01-01 LAB — ETHANOL: Alcohol, Ethyl (B): <10 mg/dL (ref <10)

## 2018-01-01 MED ORDER — SODIUM CHLORIDE 0.9 % IV BOLUS
1000.0000 mL | Freq: Once | INTRAVENOUS | Status: AC
  Administered 2018-01-01: 1000 mL via INTRAVENOUS

## 2018-01-01 MED ORDER — PROCHLORPERAZINE EDISYLATE 10 MG/2ML IJ SOLN
10.0000 mg | Freq: Once | INTRAMUSCULAR | Status: AC
  Administered 2018-01-01: 10 mg via INTRAVENOUS
  Filled 2018-01-01: qty 2

## 2018-01-01 MED ORDER — POTASSIUM CHLORIDE CRYS ER 20 MEQ PO TBCR
60.0000 meq | EXTENDED_RELEASE_TABLET | Freq: Once | ORAL | Status: AC
  Administered 2018-01-01: 60 meq via ORAL
  Filled 2018-01-01: qty 3

## 2018-01-01 MED ORDER — LORAZEPAM 2 MG/ML IJ SOLN
0.5000 mg | Freq: Once | INTRAMUSCULAR | Status: AC
  Administered 2018-01-01: 0.5 mg via INTRAVENOUS
  Filled 2018-01-01: qty 1

## 2018-01-01 MED ORDER — POTASSIUM CHLORIDE CRYS ER 20 MEQ PO TBCR: 20.0000 meq | EXTENDED_RELEASE_TABLET | Freq: Two times a day (BID) | ORAL | 0 refills | Status: DC

## 2018-01-01 NOTE — Discharge Instructions (Signed)
Please call your neurologist for follow up.  Continue your medicine as prescribed.

## 2018-01-01 NOTE — ED Notes (Signed)
Pt to CT at this time.  Family in room

## 2018-01-01 NOTE — ED Triage Notes (Signed)
Pt arrives via EMS from home, called by family who stated pt was aphasic with right sided facial droop and right sided weakness. On EMS arrival patient was catatonic,  grips equal, stroke scale negative. At bridge assessed by Dr. Gustavus Messing. No droop, drift noted. On arrival to room pt with right arm drift, no droop, no slurred speech noted. Pt with known seizure hx. Oriented to self, place, disoriented to situation and time. VSS. 18g RAC. cbg 127.

## 2018-01-01 NOTE — ED Notes (Signed)
Pt resting quietly with eyes closed

## 2018-01-07 ENCOUNTER — Ambulatory Visit (INDEPENDENT_AMBULATORY_CARE_PROVIDER_SITE_OTHER): Payer: Medicare Other | Admitting: Neurology

## 2018-01-07 ENCOUNTER — Telehealth: Payer: Self-pay | Admitting: Neurology

## 2018-01-07 ENCOUNTER — Encounter: Payer: Self-pay | Admitting: Neurology

## 2018-01-07 VITALS — BP 158/99 | HR 86 | Ht 65.0 in | Wt 241.0 lb

## 2018-01-07 DIAGNOSIS — R531 Weakness: Secondary | ICD-10-CM | POA: Diagnosis not present

## 2018-01-07 DIAGNOSIS — M6281 Muscle weakness (generalized): Secondary | ICD-10-CM | POA: Insufficient documentation

## 2018-01-07 HISTORY — DX: Muscle weakness (generalized): M62.81

## 2018-01-07 NOTE — Telephone Encounter (Signed)
Patient has a scheduling block. I am unable to schedule patient's 3 mo f/u w/ np per Dr. Krista Blue. Patient states she has had this problem before with scheduling.

## 2018-01-07 NOTE — Addendum Note (Signed)
Addended by: Marcial Pacas on: 01/07/2018 04:56 PM   Modules accepted: Level of Service

## 2018-01-07 NOTE — Telephone Encounter (Signed)
I have ordered MRI of brain to evaluate for possible TIA, patient has vagal nerve stimulator placement, I have advised patient to come to office prior to scheduled MRI to turn off the vagal nerve stimulator, and turn it back on after MRI  It is patient's responsibility to inform us her MRI schedule,

## 2018-01-07 NOTE — Progress Notes (Addendum)
PATIENT: CURSTIN SCHMALE DOB: 1965-10-24  Chief Complaint  Patient presents with  . Episode of shaking/weakness    Reports ED visit for shaking, full body weakness, frozen face (unable to smile) and inability to walk.  Stated there was concern over possible stroke activity but she was unable to have MRI due to her VNS.  She is under the care of Dr. Joesph July at [facility] for her seizures.     HISTORICAL  KYRIAKI MODER is a 52 year old female, seen in refer by her primary care doctor Guadlupe Spanish for evaluation of right arm pain, initial evaluation was on June 12, 2017.  I reviewed and summarized the referring note, she has history of hypertension, hypothyroidism, coronary artery disease, the patient at [facility] weight loss program, candidate for bariatric surgery, BMI more than 50, she had a history of vagal nerve stimulator in the past,  I was able to review [facility] neurology visit by Dr. 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, based on the note, she is supposed to take Keppra 500 twice daily, Topamax 200 mg twice daily, reported seizure spells around the time of her menstruation, described seizure as dizziness spells, followed by room spinning, inability to move, then having generalized tonic-clonic body shaking, episodes last for a few minutes,  In 2014, she was treated with prolonged antibiotic status post vagal nerve stimulator pocket infection with Musser and Morganella morganii, she completed 4-week course of ertapenem, Dapto, and rifampin on August 19, 2012, was started on oral suppression with TMP/SMX on August 20, 2012, continued p.o. antibiotic without problems,  She also complains of pain and swelling of right arm, even went through cardiology  evaluation that was determined not to be cardiac etiology,  She was seen by neurologist on Oct 23, 2012, EMG.NCS was normal in July 2014.  Today she continue complains of right arm swelling, right shoulder pain, radiating to right lateral arm, pronation and supination aggravate her right arm pain, intermittent weak grip on the right-hand side, difficult to make a tight fist,  She also complains of mild neck pain, mild gait abnormality, denies bowel bladder incontinence.  Complains of frequent dizzy spells, she continue complains of seizure-like activity, most recent one was in June 09, 2017, she stares, she can feel it coming on, get confused, lasting for a few minutes,  She is only taking Topamax 100 mg 2 tablets twice daily, she has stopped the Keppra due to side effects,  UPDATE January 07 2018: She had her bariatric surgery on Oct 26 2017, works very well, she has lost 60 pounds  She presented to the emergency room on January 01, 2018, she was noted to have elevated blood pressure, up to 165/16, heart rate was 115 per patient, she did not feel good, when she is 25 up, bilateral lower extremity give out underneath normal underscore, she describes difficulty getting her words out, right arm weakness, right facial asymmetry, she was taken to the emergency room, CT head without contrast was normal, the whole episode lasted about 30 minutes,  Now she continue complains of right shoulder pain, limited range of motion of the right shoulder, gait abnormality  REVIEW OF SYSTEMS: Full 14 system review of systems performed and notable only for weight loss, fatigue, cough, memory loss, confusion, weakness, numbness, seizure,  restless leg, depression, not enough sleep, decreased energy, disinterested in activities, joint pain  ALLERGIES: Allergies  Allergen Reactions  . Amitriptyline Swelling  . Amlodipine Swelling  . Latex Hives, Itching, Swelling and Rash    Burning also  . Amantadines Swelling  .  Aspirin Nausea Only    Other reaction(s): Other (See Comments) Irritates stomach also/hx of stomach ulcers  . Simvastatin Swelling    Other reaction(s): Other (See Comments) pain Muscle pains also  . Tape Rash    Pt able to tolerate paper tape     HOME MEDICATIONS: Current Outpatient Medications  Medication Sig Dispense Refill  . acetaminophen (TYLENOL) 325 MG tablet Take 975 mg by mouth every 6 (six) hours as needed for headache.    . albuterol (PROVENTIL HFA;VENTOLIN HFA) 108 (90 Base) MCG/ACT inhaler Inhale 2 puffs into the lungs every 6 (six) hours as needed for wheezing or shortness of breath. 1 Inhaler 6  . carvedilol (COREG) 25 MG tablet Take 25 mg by mouth 2 (two) times daily.  0  . cyclobenzaprine (FLEXERIL) 5 MG tablet Take 5 mg by mouth 2 (two) times daily.    . fluticasone (FLONASE) 50 MCG/ACT nasal spray Place 1 spray into both nostrils 2 (two) times daily as needed for allergies.     . furosemide (LASIX) 40 MG tablet Take 1 tablet (40 mg total) by mouth daily. 90 tablet 3  . gabapentin (NEURONTIN) 300 MG capsule TAKE 1 CAPSULE (300 MG TOTAL) BY MOUTH AT BEDTIME. 90 capsule 0  . lamoTRIgine (LAMICTAL) 100 MG tablet Take 1 tablet (100 mg total) by mouth 2 (two) times daily. 60 tablet 11  . levothyroxine (SYNTHROID, LEVOTHROID) 50 MCG tablet Take 50 mcg by mouth daily.     Marland Kitchen lidocaine (XYLOCAINE) 2 % jelly Apply 1 application topically as needed. 30 mL 0  . lisinopril (PRINIVIL,ZESTRIL) 10 MG tablet Take 1 tablet (10 mg total) by mouth daily. 90 tablet 3  . loperamide (IMODIUM) 2 MG capsule Take 1 capsule (2 mg total) by mouth as needed for diarrhea or loose stools. 10 capsule 0  . mometasone-formoterol (DULERA) 100-5 MCG/ACT AERO Inhale 2 puffs into the lungs 2 (two) times daily. 1 Inhaler 2  . nitroGLYCERIN (NITRODUR - DOSED IN MG/24 HR) 0.2 mg/hr patch PLACE 1 PATCH ONTO THE SKIN DAILY. 30 patch 1  . nitroGLYCERIN (NITROSTAT) 0.4 MG SL tablet Place 1 tablet (0.4 mg total)  under the tongue every 5 (five) minutes as needed for chest pain. 25 tablet 5  . oxyCODONE-acetaminophen (PERCOCET) 5-325 MG tablet Take 1 tablet by mouth 2 (two) times daily as needed for severe pain. 10 tablet 0  . pantoprazole (PROTONIX) 40 MG tablet Take 1 tablet (40 mg total) by mouth 2 (two) times daily. 60 tablet 2  . potassium chloride SA (K-DUR,KLOR-CON) 20 MEQ tablet Take 1 tablet (20 mEq total) by mouth 2 (two) times daily. 8 tablet 0  . promethazine (PHENERGAN) 25 MG tablet Take 25 mg by mouth every 8 (eight) hours as needed for nausea/vomiting.  1  . topiramate (TOPAMAX) 200 MG tablet Take 1 tablet (200 mg total) by mouth 2 (two) times daily. 60 tablet 11  . verapamil (CALAN-SR) 240 MG CR tablet TAKE 1 TABLET (240 MG TOTAL) BY MOUTH AT BEDTIME. 90 tablet 3  . spironolactone (ALDACTONE) 25 MG tablet Take 1 tablet (25 mg total) by mouth daily. 90 tablet 3   No current facility-administered medications for this visit.     PAST  MEDICAL HISTORY: Past Medical History:  Diagnosis Date  . Asthma   . Biliary dyskinesia    a. s/p cholecystectomy.  . 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 Abnl stress [place] 2008, f/u cath reportedly nl. b. ETT-Myoview 04/2011 - EKG changes but normal perfusion. Cor CT - no coronary calcium, no definite stenosis though mRCA not fully evaluated. c. 10/2011 - tn elevated in [place], LHC without CAD. Started on Ranexa, anti-anginals ?microvascular dz but later stopped while in hospital on abx.  . Complication of anesthesia    hard to wake up-had to be reminded to breath  . GERD (gastroesophageal reflux disease)    a. Severe.  Marland Kitchen HTN (hypertension)   . Hx of cardiovascular stress test    [place] Myoview 8/14:  Normal, EF 74%  . Hx of echocardiogram    Echo 3/16:  Mild LVH, EF 55-60%, Gr 1 DD, trivial MR, mild LAE, normal RVF  . Hypothyroid   . MI (myocardial infarction) ([employer])   . Migraine   . Morbid obesity ([place]) 07/26/2017  . MRSA infection      a. After vagal nerve stimulator at [place] - surgical site MRSA infection, PICC placed.  . Obesity   . OSA (obstructive sleep apnea) 07/26/2017  . Palpitations    a. 08/2014: 48 hour holter with 2 PVCs otherwise normal.  . Seizure disorder ([place])    a. since childhood. b. s/p vagal nerve stimulator at [facility].  . Seizures ([place])     PAST SURGICAL HISTORY: Past Surgical History:  Procedure Laterality Date  . [place] N/A 06/01/2016   Procedure: Right Heart Cath;  Surgeon: Larey Dresser, MD;  Location: [facility];  Service: Cardiovascular;  Laterality: N/A;  . CESAREAN SECTION     placement of vagal nerve stimulator.  . CHOLECYSTECTOMY N/A 01/24/2013   Procedure: LAPAROSCOPIC CHOLECYSTECTOMY;  Surgeon: Harl Bowie, MD;  Location: [facility];  Service: General;  Laterality: N/A;  . IMPLANTATION VAGAL NERVE STIMULATOR  [phone]   battery chg-[facility]    FAMILY HISTORY: Family History  Problem Relation Age of Onset  . Coronary artery disease Mother 14  . Other Father        killed  . Kidney disease Father   . Coronary artery disease Maternal Grandmother   . Cancer Other   . Hypertension Other   . Stroke Other     SOCIAL HISTORY:  Social History   Socioeconomic History  . Marital status: Single    Spouse name: Not on file  . Number of children: 2  . Years of education: college  . Highest education level: Associate degree: academic program  Occupational History  . Occupation: Disabled  Social Needs  . Financial resource strain: Not on file  . Food insecurity:    Worry: Not on file    Inability: Not on file  . Transportation needs:    Medical: Not on file    Non-medical: Not on file  Tobacco Use  . Smoking status: Never Smoker  . Smokeless tobacco: Never Used  Substance and Sexual Activity  . Alcohol use: Yes    Alcohol/week: 0.6 - 1.8 oz    Types: 1 - 3  Glasses of wine per week    Comment: occasional  . Drug use: No  . Sexual activity: Yes    Birth control/protection: None  Lifestyle  .  Physical activity:    Days per week: Not on file    Minutes per session: Not on file  . Stress: Not on file  Relationships  . Social connections:    Talks on phone: Not on file    Gets together: Not on file    Attends religious service: Not on file    Active member of club or organization: Not on file    Attends meetings of clubs or organizations: Not on file    Relationship status: Not on file  . Intimate partner violence:    Fear of current or ex partner: Not on file    Emotionally abused: Not on file    Physically abused: Not on file    Forced sexual activity: Not on file  Other Topics Concern  . Not on file  Social History Narrative   Lives at home with her daughter.   Right-handed.   No caffeine per day.     PHYSICAL EXAM   Vitals:   01/07/18 1620  BP: (!) 158/99  Pulse: 86  Weight: 241 lb (109.3 kg)  Height: 5\' 5"  (1.651 m)    Not recorded      Body mass index is 40.1 kg/m.  PHYSICAL EXAMNIATION:  Gen: NAD, conversant, well nourised, obese, well groomed                     Cardiovascular: Regular rate rhythm, no peripheral edema, warm, nontender. Eyes: Conjunctivae clear without exudates or hemorrhage Neck: Supple, no carotid bruits. Pulmonary: Clear to auscultation bilaterally   NEUROLOGICAL EXAM:  MENTAL STATUS: Speech:    Speech is normal; fluent and spontaneous with normal comprehension.  Cognition:     Orientation to time, place and person     Normal recent and remote memory     Normal Attention span and concentration     Normal Language, naming, repeating,spontaneous speech     Fund of knowledge   CRANIAL NERVES: CN II: Visual fields are full to confrontation. Fundoscopic exam is normal with sharp discs and no vascular changes. Pupils are round equal and briskly reactive to light. CN III, IV, VI:  extraocular movement are normal. No ptosis. CN V: Facial sensation is intact to pinprick in all 3 divisions bilaterally. Corneal responses are intact.  CN VII: Face is symmetric with normal eye closure and smile. CN VIII: Hearing is normal to rubbing fingers CN IX, X: Palate elevates symmetrically. Phonation is normal. CN XI: Head turning and shoulder shrug are intact CN XII: Tongue is midline with normal movements and no atrophy.  MOTOR: Right shoulder pain, limited range of motion, no significant weakness, variable effort on examinations,  REFLEXES: Reflexes are 2+ and symmetric at the biceps, triceps, knees, and ankles. Plantar responses are flexor.  SENSORY: Intact to light touch, pinprick, positional sensation and vibratory sensation are intact in fingers and toes.  COORDINATION: Rapid alternating movements and fine finger movements are intact. There is no dysmetria on finger-to-nose and heel-knee-shin.    GAIT/STANCE: She rely on her walker, cautious, mildly unsteady  DIAGNOSTIC DATA (LABS, IMAGING, TESTING) - I reviewed patient records, labs, notes, testing and imaging myself where available.   ASSESSMENT AND PLAN  CATRENA VARI is a 52 y.o. female    Epilepsy  Continue Topamax 200 mg twice a day, she wants to continue her epilepsy management at [facility] Dr. Joesph July  Sudden onset of right-sided weakness,  She does has vascular risk factor of aging, hypertension, hypothyroidism,  obesity,   MRI of brain without contrast to rule out structural lesion  Once she had her MRI scheduled, she is advised to call my office to return off the VNS stimulator, and turn it back on after the MRI  Marcial Pacas, M.D. Ph.D.  [facility] [address], [place], [place] [address] Ph: [phone] Fax: [phone]  CC: Katherine Roan, MD

## 2018-01-08 NOTE — Telephone Encounter (Signed)
Called the patient who provided me with the following information:  VNS placed in [place] replaced in 2014  Make:  [place]: 102 Serial #: [id number]  She is aware to come to our office prior to her MRI to have the VNS turned off and she will need to come back again to have it turned back on.  We will need her MRI date and time.

## 2018-01-08 NOTE — Telephone Encounter (Signed)
Noted, thank you.. I faxed the information to [place] at [facility] she will get the MRI Tech's to review it and then give me a call back to schedule it.

## 2018-01-08 NOTE — Telephone Encounter (Signed)
The patient will have to have her MRI at [facility] but they will not let me schedule it until I have information about her VNS. Do you know the year,make model serial number?

## 2018-01-09 NOTE — Telephone Encounter (Signed)
Left a voicemail for patient to be aware to come to our office at 11:30 AM on Wed. 01/16/18 to get her VNS off and then to come right back after she has the MRI to get it turned back on.

## 2018-01-10 ENCOUNTER — Encounter: Payer: Self-pay | Admitting: Cardiology

## 2018-01-10 ENCOUNTER — Ambulatory Visit (INDEPENDENT_AMBULATORY_CARE_PROVIDER_SITE_OTHER): Payer: Medicare Other | Admitting: Cardiology

## 2018-01-10 VITALS — BP 134/80 | HR 91 | Ht 65.0 in | Wt 241.0 lb

## 2018-01-10 DIAGNOSIS — G4733 Obstructive sleep apnea (adult) (pediatric): Secondary | ICD-10-CM

## 2018-01-10 DIAGNOSIS — E785 Hyperlipidemia, unspecified: Secondary | ICD-10-CM

## 2018-01-10 DIAGNOSIS — I5032 Chronic diastolic (congestive) heart failure: Secondary | ICD-10-CM | POA: Diagnosis not present

## 2018-01-10 DIAGNOSIS — I10 Essential (primary) hypertension: Secondary | ICD-10-CM

## 2018-01-10 MED ORDER — LISINOPRIL 20 MG PO TABS: 20.0000 mg | ORAL_TABLET | Freq: Every day | ORAL | 3 refills | Status: DC

## 2018-01-10 NOTE — ED Provider Notes (Signed)
[facility] EMERGENCY DEPARTMENT Provider Note   CSN: [id number] Arrival date & time: 01/01/18  1421     History   Chief Complaint Chief Complaint  Patient presents with  . Seizures   Level 5 caveat: Altered mental status  HPI [name] is a 52 y.o. female.  HPI Patient is a 52 year old female was brought to the emergency department for possible stroke as family noted that the patient was without speech and seemed weak on her right side with a right-sided facial droop.  When EMS arrived the patient was catatonic with equal grips and a negative stroke scale.  Patient has a history of seizures.  Per patient and per patient's family she is treated with Topamax for her "seizures".  Increased stress levels at home.    Past Medical History:  Diagnosis Date  . Asthma   . Biliary dyskinesia    a. s/p cholecystectomy.  . 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 Abnl stress [place] 2008, f/u cath reportedly nl. b. ETT-Myoview 04/2011 - EKG changes but normal perfusion. Cor CT - no coronary calcium, no definite stenosis though mRCA not fully evaluated. c. 10/2011 - tn elevated in [place], LHC without CAD. Started on Ranexa, anti-anginals ?microvascular dz but later stopped while in hospital on abx.  . Complication of anesthesia    hard to wake up-had to be reminded to breath  . GERD (gastroesophageal reflux disease)    a. Severe.  Marland Kitchen HTN (hypertension)   . Hx of cardiovascular stress test    Lex Myoview 8/14:  Normal, EF 74%  . Hx of echocardiogram    Echo 3/16:  Mild LVH, EF 55-60%, Gr 1 DD, trivial MR, mild LAE, normal RVF  . Hypothyroid   . MI (myocardial infarction) ([place])   . Migraine   . Morbid obesity ([place]) 07/26/2017  . MRSA infection    a. After vagal nerve stimulator at [facility] - surgical site MRSA infection, PICC placed.  . Obesity   . OSA (obstructive sleep apnea) 07/26/2017  . Palpitations    a. 08/2014: 48 hour holter with 2 PVCs  otherwise normal.  . Seizure disorder ([place])    a. since childhood. b. s/p vagal nerve stimulator at [facility].  . Seizures [facility])     Patient Active Problem List   Diagnosis Date Noted  . Right sided weakness 01/07/2018  . Muscle weakness (generalized) 01/07/2018  . Rectal pain 12/19/2017  . Flu-like symptoms 08/09/2017  . Diarrhea 08/09/2017  . OSA (obstructive sleep apnea) 07/26/2017  . Morbid obesity ([place]) 07/26/2017  . Visit for suture removal 04/20/2017  . Ear pain, bilateral 03/05/2017  . Arm pain, diffuse, right 01/19/2017  . Hydradenitis 11/09/2016  . Irritant contact dermatitis due to other agents 11/09/2016  . Takotsubo cardiomyopathy 10/26/2016  . Low back pain 04/19/2016  . Abnormal weight gain 06/11/2015  . BMI 40.0-44.9, adult ([place]) 06/11/2015  . Asthma 08/24/2014  . Chronic diastolic heart failure ([place]) 06/19/2014  . Allergic rhinitis 05/04/2014  . Pharyngoesophageal dysphagia 01/30/2014  . Hypothyroidism 07/02/2013  . Chronic chest pain   . GERD (gastroesophageal reflux disease) 02/10/2013  . Biliary dyskinesia 12/30/2012  . Status post VNS (vagus nerve stimulator) placement 06/10/2012  . Refusal of blood transfusions as patient is Jehovah's Witness 05/20/2012  . Dyslipidemia 12/29/2011  . 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 Dyslipidemia, goal LDL below 70 05/17/2011  . Non-cardiac chest pain  secondary to GERD 04/24/2011  . HTN, goal below 130/80 04/24/2011    Past Surgical History:  Procedure Laterality Date  . [place] N/A 06/01/2016   Procedure: Right Heart Cath;  Surgeon: Larey Dresser, MD;  Location: [facility] CV LAB;  Service: Cardiovascular;  Laterality: N/A;  . CESAREAN SECTION     placement of vagal nerve stimulator.  . CHOLECYSTECTOMY N/A 01/24/2013   Procedure: LAPAROSCOPIC CHOLECYSTECTOMY;  Surgeon: Harl Bowie, MD;   Location: [facility];  Service: General;  Laterality: N/A;  . IMPLANTATION VAGAL NERVE STIMULATOR  2000,2013   [facility]     OB History   None      Home Medications    Prior to Admission medications   Medication Sig Start Date End Date Taking? Authorizing Provider  acetaminophen (TYLENOL) 325 MG tablet Take 975 mg by mouth every 6 (six) hours as needed for headache.   Yes [provider]  albuterol (PROVENTIL HFA;VENTOLIN HFA) 108 (90 Base) MCG/ACT inhaler Inhale 2 puffs into the lungs every 6 (six) hours as needed for wheezing or shortness of breath. 09/29/16  Yes Dorothy Spark, MD  carvedilol (COREG) 25 MG tablet Take 25 mg by mouth 2 (two) times daily. 07/27/17  Yes [provider]  cyclobenzaprine (FLEXERIL) 5 MG tablet Take 5 mg by mouth 2 (two) times daily.   Yes [provider]  fluticasone (FLONASE) 50 MCG/ACT nasal spray Place 1 spray into both nostrils 2 (two) times daily as needed for allergies.  04/17/14  Yes [provider]  furosemide (LASIX) 40 MG tablet Take 1 tablet (40 mg total) by mouth daily. 08/27/17 08/22/18 Yes Daune Perch, NP  gabapentin (NEURONTIN) 300 MG capsule TAKE 1 CAPSULE (300 MG TOTAL) BY MOUTH AT BEDTIME. 05/01/17 05/01/18 Yes Winfrey, Jenne Pane, MD  lamoTRIgine (LAMICTAL) 100 MG tablet Take 1 tablet (100 mg total) by mouth 2 (two) times daily. 06/12/17  Yes Marcial Pacas, MD  levothyroxine (SYNTHROID, LEVOTHROID) 50 MCG tablet Take 50 mcg by mouth daily.    Yes [provider]  lidocaine (XYLOCAINE) 2 % jelly Apply 1 application topically as needed. 12/19/17  Yes Isabelle Course, MD  loperamide (IMODIUM) 2 MG capsule Take 1 capsule (2 mg total) by mouth as needed for diarrhea or loose stools. 08/09/17  Yes Lorella Nimrod, MD  mometasone-formoterol (DULERA) 100-5 MCG/ACT AERO Inhale 2 puffs into the lungs 2 (two) times daily. 09/26/16  Yes Dorothy Spark, MD  nitroGLYCERIN (NITROSTAT) 0.4  MG SL tablet Place 1 tablet (0.4 mg total) under the tongue every 5 (five) minutes as needed for chest pain. 11/06/16  Yes Dorothy Spark, MD  oxyCODONE-acetaminophen (PERCOCET) 5-325 MG tablet Take 1 tablet by mouth 2 (two) times daily as needed for severe pain. 12/19/17  Yes Axel Filler, MD  pantoprazole (PROTONIX) 40 MG tablet Take 1 tablet (40 mg total) by mouth 2 (two) times daily. 08/05/16  Yes Verlee Monte, MD  promethazine (PHENERGAN) 25 MG tablet Take 25 mg by mouth every 8 (eight) hours as needed for nausea/vomiting. 12/13/17  Yes [provider]  topiramate (TOPAMAX) 200 MG tablet Take 1 tablet (200 mg total) by mouth 2 (two) times daily. 06/12/17  Yes Marcial Pacas, MD  verapamil (CALAN-SR) 240 MG CR tablet TAKE 1 TABLET (240 MG TOTAL) BY MOUTH AT BEDTIME. 09/10/17  Yes Dorothy Spark, MD  lisinopril (PRINIVIL,ZESTRIL) 10 MG  tablet Take 1 tablet (10 mg total) by mouth daily. 08/27/17 08/22/18  Daune Perch, NP  nitroGLYCERIN (NITRODUR - DOSED IN MG/24 HR) 0.2 mg/hr patch PLACE 1 PATCH ONTO THE SKIN DAILY. 09/26/16   Dorothy Spark, MD  potassium chloride SA (K-DUR,KLOR-CON) 20 MEQ tablet Take 1 tablet (20 mEq total) by mouth 2 (two) times daily. 01/01/18   Jola Schmidt, MD  spironolactone (ALDACTONE) 25 MG tablet Take 1 tablet (25 mg total) by mouth daily. 09/20/16 08/27/17  Liliane Shi, PA-C    Family History Family History  Problem Relation Age of Onset  . Coronary artery disease Mother 60  . Other Father        killed  . Kidney disease Father   . Coronary artery disease Maternal Grandmother   . Cancer Other   . Hypertension Other   . Stroke Other     Social History Social History   Tobacco Use  . Smoking status: Never Smoker  . Smokeless tobacco: Never Used  Substance Use Topics  . Alcohol use: Yes    Alcohol/week: 1.0 - 3.0 standard drinks    Types: 1 - 3 Glasses of wine per week    Comment: occasional  . Drug use: No     Allergies     Amitriptyline; Amlodipine; Latex; Amantadines; Aspirin; Simvastatin; and Tape   Review of Systems Review of Systems  Unable to perform ROS: Mental status change     Physical Exam Updated Vital Signs BP 122/71   Pulse 80   Temp 98.7 F (37.1 C) (Oral)   Resp 16   Ht 5\' 5"  (1.651 m)   Wt 112.5 kg   SpO2 99%   BMI 41.27 kg/m   Physical Exam  Constitutional: She appears well-developed and well-nourished. No distress.  HENT:  Head: Normocephalic and atraumatic.  Eyes: Pupils are equal, round, and reactive to light. EOM are normal.  Neck: Normal range of motion.  Cardiovascular: Normal rate, regular rhythm and normal heart sounds.  Pulmonary/Chest: Effort normal and breath sounds normal.  Abdominal: Soft. She exhibits no distension. There is no tenderness.  Musculoskeletal: Normal range of motion.  Neurological: She is alert.  5/5 strength in major muscle groups of  bilateral upper and lower extremities. Speech normal. No facial asymetry.   Skin: Skin is warm and dry.  Psychiatric: She has a normal mood and affect. Judgment normal.  Nursing note and vitals reviewed.    ED Treatments / Results  Labs (all labs ordered are listed, but only abnormal results are displayed) Labs Reviewed  CBC - Abnormal; Notable for the following components:      Result Value   Hemoglobin 11.1 (*)    HCT 35.4 (*)    RDW 16.0 (*)    All other components within normal limits  COMPREHENSIVE METABOLIC PANEL - Abnormal; Notable for the following components:   Potassium 2.7 (*)    Calcium 8.5 (*)    Albumin 3.4 (*)    All other components within normal limits  ETHANOL  RAPID URINE DRUG SCREEN, HOSP PERFORMED  I-STAT CG4 LACTIC ACID, ED    EKG None  Radiology No results found.  Procedures Procedures (including critical care time)  Medications Ordered in ED Medications  sodium chloride 0.9 % bolus 1,000 mL (0 mLs Intravenous Stopped 01/01/18 1842)  prochlorperazine (COMPAZINE)  injection 10 mg (10 mg Intravenous Given 01/01/18 1550)  LORazepam (ATIVAN) injection 0.5 mg (0.5 mg Intravenous Given 01/01/18 1549)  potassium chloride SA (K-DUR,KLOR-CON)  CR tablet 60 mEq (60 mEq Oral Given 01/01/18 1838)     Initial Impression / Assessment and Plan / ED Course  I have reviewed the triage vital signs and the nursing notes.  Pertinent labs & imaging results that were available during my care of the patient were reviewed by me and considered in my medical decision making (see chart for details).     Patient continued to improve in the emergency department.  No seizure activity noted.  Work-up otherwise without significant abnormality.  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  Patient family encouraged to follow-up with a primary care physician and outpatient referral to neurology.  May benefit from EEG as an outpatient  Encouraged to return to the ER for new or worsening symptoms.  Mild hypokalemia treated  Final Clinical Impressions(s) / ED Diagnoses   Final diagnoses:  Episode of shaking    ED Discharge Orders         Ordered    potassium chloride SA (K-DUR,KLOR-CON) 20 MEQ tablet  2 times daily     01/01/18 2047           Jola Schmidt, MD 01/10/18 [phone]

## 2018-01-10 NOTE — Patient Instructions (Signed)
Medication Instructions: INCREASE: Lisinopril to 20 mg daily  Labwork: None  Procedures/Testing: None  Follow-Up: Your physician wants you to follow-up in: 6 months with Dr.Nelson You will receive a reminder letter in the mail two months in advance. If you don't receive a letter, please call our office to schedule the follow-up appointment.   Any Additional Special Instructions Will Be Listed Below (If Applicable).     If you need a refill on your cardiac medications before your next appointment, please call your pharmacy.

## 2018-01-10 NOTE — [place] Notes (Signed)
Cardiology Office Note:    Date:  01/10/2018   ID:  [name], DOB [birth year], MRN [id number]  PCP:  Katherine Roan, MD  Cardiologist:  Ena Dawley, MD  Referring MD: Katherine Roan, MD   Chief Complaint  Patient presents with  . Follow-up    Hypertension    History of Present Illness:    [name] is a 52 y.o. female with a past medical history significant for history of chest pain and shortness of breath with prior negative caths, migraines, asthma, seizure disorder with vagal nerve stimulator and hypertension.  She has had chest pain chronically over several years.  She was noted to have a reportedly normal cardiac cath in [place] in 2008.  In 2012 she was noted to have ST depression on exercise ECG however Myoview showed no evidence of ischemia and normal EF.  Given her ongoing chest pain and abnormal ECG she underwent coronary CT angiogram.  It was a difficult study as they were unable to get her heart rate into the ideal range, however, she had no coronary calcium and no definite stenosis.  In 2013 in [place] she presented to the ED with exertional chest pain and had a mildly elevated troponin.  She underwent cardiac catheterization that showed no angiographic CAD.  Echocardiogram showed normal EF.   Right and left heart cath in 05/24/2016 showed normal right and left heart filling pressures, no pulmonary hypertension and normal cardiac output.  I saw her in March of this year at which time she was complaining about lower extremity edema and she had run out of her blood pressure medications due to recall.  I discontinued her losartan and initiated lisinopril as well as resuming Lasix.  She also reported that she was in the process of trying to get weight loss surgery.  Was advised on decreasing her sodium intake.  She is here today for follow up. Had wt loss surgery in May, did well and has lost 60 lbs. She is now taking care of 6 grandchildren until they go back  to school. She has had some nausea and expected decreased nutrition, followed by her wt loss surgeon. Recent labs per surgeon showed potassium of 2.7 and she was started on potasium supplement. No longer taking lasix, no edema. She was taken off verapamil prior to surgery for low BP.   She reports that her BP has been running high at home 150's/90 with occ up to 170/115. BP OK here today, but noted to be elevated in office last with Dr. Krista Blue last week. She was seen in ED 01/01/18 for seizure. Has residual right arm and leg mild weakness. She is scheduled for an MRI and neurology follow up. She limits her sodium intake.   No chest discomfort, shortness of breath or orthopnea. Puffiness of left ankle.    Past Medical History:  Diagnosis Date  . Asthma   . Biliary dyskinesia    a. s/p cholecystectomy.  . 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 Abnl stress [place] 2008, f/u cath reportedly nl. b. ETT-Myoview 04/2011 - EKG changes but normal perfusion. Cor CT - no coronary calcium, no definite stenosis though mRCA not fully evaluated. c. 10/2011 - tn elevated in [place], LHC without CAD. Started on Ranexa, anti-anginals ?microvascular dz but later stopped while in hospital on abx.  . Complication of anesthesia    hard to wake up-had to be reminded to breath  . GERD (gastroesophageal reflux  disease)    a. Severe.  Marland Kitchen HTN (hypertension)   . Hx of cardiovascular stress test    Lex Myoview 8/14:  Normal, EF 74%  . Hx of echocardiogram    Echo 3/16:  Mild LVH, EF 55-60%, Gr 1 DD, trivial MR, mild LAE, normal RVF  . Hypothyroid   . MI (myocardial infarction) ([place])   . Migraine   . Morbid obesity ([place]) 07/26/2017  . MRSA infection    a. After vagal nerve stimulator at [facility] - surgical site MRSA infection, PICC placed.  . Obesity   . OSA (obstructive sleep apnea) 07/26/2017  . Palpitations    a. 08/2014: 48 hour holter with 2 PVCs otherwise normal.  . Seizure disorder ([place])    a.  since childhood. b. s/p vagal nerve stimulator at [facility].  . Seizures ([place])     Past Surgical History:  Procedure Laterality Date  . [place] N/A 06/01/2016   Procedure: Right Heart Cath;  Surgeon: Larey Dresser, MD;  Location: [facility];  Service: Cardiovascular;  Laterality: N/A;  . CESAREAN SECTION     placement of vagal nerve stimulator.  . CHOLECYSTECTOMY N/A 01/24/2013   Procedure: LAPAROSCOPIC CHOLECYSTECTOMY;  Surgeon: Harl Bowie, MD;  Location: [facility];  Service: General;  Laterality: N/A;  . IMPLANTATION VAGAL NERVE STIMULATOR  [phone]   [facility]attery [facility]    Current Medications: Current Meds  Medication Sig  . acetaminophen (TYLENOL) 325 MG tablet Take 975 mg by mouth every 6 (six) hours as needed for headache.  . albuterol (PROVENTIL HFA;VENTOLIN HFA) 108 (90 Base) MCG/ACT inhaler Inhale 2 puffs into the lungs every 6 (six) hours as needed for wheezing or shortness of breath.  . carvedilol (COREG) 25 MG tablet Take 25 mg by mouth 2 (two) times daily.  . cyclobenzaprine (FLEXERIL) 5 MG tablet Take 5 mg by mouth 2 (two) times daily.  . fluticasone (FLONASE) 50 MCG/ACT nasal spray Place 1 spray into both nostrils 2 (two) times daily as needed for allergies.   . furosemide (LASIX) 40 MG tablet Take 1 tablet (40 mg total) by mouth daily.  Marland Kitchen gabapentin (NEURONTIN) 300 MG capsule TAKE 1 CAPSULE (300 MG TOTAL) BY MOUTH AT BEDTIME.  Marland Kitchen lamoTRIgine (LAMICTAL) 100 MG tablet Take 1 tablet (100 mg total) by mouth 2 (two) times daily.  Marland Kitchen levothyroxine (SYNTHROID, LEVOTHROID) 50 MCG tablet Take 50 mcg by mouth daily.   Marland Kitchen lidocaine (XYLOCAINE) 2 % jelly Apply 1 application topically as needed.  Marland Kitchen lisinopril (PRINIVIL,ZESTRIL) 20 MG tablet Take 1 tablet (20 mg total) by mouth daily.  Marland Kitchen loperamide (IMODIUM) 2 MG capsule Take 1 capsule (2 mg total) by mouth as needed for diarrhea or  loose stools.  . mometasone-formoterol (DULERA) 100-5 MCG/ACT AERO Inhale 2 puffs into the lungs 2 (two) times daily.  . nitroGLYCERIN (NITRODUR - DOSED IN MG/24 HR) 0.2 mg/hr patch PLACE 1 PATCH ONTO THE SKIN DAILY.  . nitroGLYCERIN (NITROSTAT) 0.4 MG SL tablet Place 1 tablet (0.4 mg total) under the tongue every 5 (five) minutes as needed for chest pain.  Marland Kitchen oxyCODONE-acetaminophen (PERCOCET) 5-325 MG tablet Take 1 tablet by mouth 2 (two) times daily as needed for severe pain.  . pantoprazole (PROTONIX) 40 MG tablet Take 1 tablet (40 mg total) by mouth 2 (two) times daily.  . potassium chloride SA (K-DUR,KLOR-CON) 20 MEQ tablet Take 1 tablet (20 mEq  total) by mouth 2 (two) times daily.  . promethazine (PHENERGAN) 25 MG tablet Take 25 mg by mouth every 8 (eight) hours as needed for nausea/vomiting.  Marland Kitchen spironolactone (ALDACTONE) 25 MG tablet Take 1 tablet (25 mg total) by mouth daily.  Marland Kitchen topiramate (TOPAMAX) 200 MG tablet Take 1 tablet (200 mg total) by mouth 2 (two) times daily.  . [DISCONTINUED] lisinopril (PRINIVIL,ZESTRIL) 10 MG tablet Take 1 tablet (10 mg total) by mouth daily.  . [DISCONTINUED] verapamil (CALAN-SR) 240 MG CR tablet TAKE 1 TABLET (240 MG TOTAL) BY MOUTH AT BEDTIME.     Allergies:   Amitriptyline; Amlodipine; Latex; Amantadines; Aspirin; Simvastatin; and Tape   Social History   Socioeconomic History  . Marital status: Single    Spouse name: Not on file  . Number of children: 2  . Years of education: college  . Highest education level: Associate degree: academic program  Occupational History  . Occupation: Disabled  Social Needs  . Financial resource strain: Not on file  . Food insecurity:    Worry: Not on file    Inability: Not on file  . Transportation needs:    Medical: Not on file    Non-medical: Not on file  Tobacco Use  . Smoking status: Never Smoker  . Smokeless tobacco: Never Used  Substance and Sexual Activity  . Alcohol use: Yes    Alcohol/week: 1.0  - 3.0 standard drinks    Types: 1 - 3 Glasses of wine per week    Comment: occasional  . Drug use: No  . Sexual activity: Yes    Birth control/protection: None  Lifestyle  . Physical activity:    Days per week: Not on file    Minutes per session: Not on file  . Stress: Not on file  Relationships  . Social connections:    Talks on phone: Not on file    Gets together: Not on file    Attends religious service: Not on file    Active member of club or organization: Not on file    Attends meetings of clubs or organizations: Not on file    Relationship status: Not on file  Other Topics Concern  . Not on file  Social History Narrative   Lives at home with her daughter.   Right-handed.   No caffeine per day.     Family History: The patient's family history includes Cancer in her other; Coronary artery disease in her maternal grandmother; Coronary artery disease (age of onset: 47) in her mother; Hypertension in her other; Kidney disease in her father; Other in her father; Stroke in her other. ROS:   Please see the history of present illness.     All other systems reviewed and are negative.  EKGs/Labs/Other Studies Reviewed:    The following studies were reviewed today:  R&L heart cath 05/2816 Normal right and left heart filling pressures, no pulmonary hypertension.  Normal cardiac output.  No changes.  EKG:  EKG is not ordered today.    Recent Labs: 05/21/2017: TSH 4.010 08/27/2017: NT-Pro BNP 29 01/01/2018: ALT 34; BUN 6; Creatinine, Ser 0.92; Hemoglobin 11.1; Platelets 196; Potassium 2.7; Sodium 142   Recent Lipid Panel    Component Value Date/Time   CHOL 200 (H) 05/21/2017 1111   TRIG 58 05/21/2017 1111   HDL 59 05/21/2017 1111   CHOLHDL 3.4 05/21/2017 1111   CHOLHDL 3 09/30/2012 0929   VLDL 11.2 09/30/2012 0929   LDLCALC 129 (H) 05/21/2017 1111  Physical Exam:    VS:  BP 134/80 (BP Location: Right Arm, Patient Position: Sitting, Cuff Size: Large)   Pulse 91    Ht 5\' 5"  (1.651 m)   Wt 241 lb (109.3 kg)   SpO2 99%   BMI 40.10 kg/m     Wt Readings from Last 3 Encounters:  01/10/18 241 lb (109.3 kg)  01/07/18 241 lb (109.3 kg)  01/01/18 248 lb (112.5 kg)     Physical Exam  Constitutional: She is oriented to person, place, and time. She appears well-developed and well-nourished. No distress.  Neck: Normal range of motion. Neck supple. No JVD present.  Cardiovascular: Normal rate, regular rhythm, normal heart sounds and intact distal pulses. Exam reveals no gallop and no friction rub.  No murmur heard. Pulmonary/Chest: Effort normal and breath sounds normal. No respiratory distress. She has no wheezes. She has no rales.  Abdominal: Soft. Bowel sounds are normal.  Musculoskeletal: Normal range of motion.  Trace right ankle puffiness  Neurological: She is alert and oriented to person, place, and time.  Skin: Skin is warm and dry.  Psychiatric: She has a normal mood and affect. Her behavior is normal. Thought content normal.  Vitals reviewed.  ASSESSMENT:    1. Chronic diastolic heart failure ([place])   2. HTN, goal below 130/80   3. Dyslipidemia, goal LDL below 70   4. OSA (obstructive sleep apnea)   5. Morbid obesity ([place])    PLAN:    In order of problems listed above:  Diastolic CHF: Still has fatigue but no chest discomfort or shortness of breath. Has not started getting more active since her surgery. Advised to start slowly and increase incrementally until 30 minutes per day.   Hypertension: Verapamil was stopped pre-op for low BP. On lisinopril 10 mg, spironolactone 25 mg and carvedilol 25 mg. Off lasix. BP OK here but has been elevated at home and at office appt. Will increase lisinopril. Has labs ordered for Monday per surgeon. I will look at those when resulted.   Morbid obesity: recent wt loss surgery and has lost 60 lbs. Still having nausea. Potassium was low, now off lasix.  Hypokalemia: Recent K+ 2.7. KDur started per surgeon.  Has follow up labs on Monday. I will look at those when resulted. May be related to her altered nutrition status. Is on potassium sparing meds so will watch.   OSA: On CPAP by Dr. Radford Pax  Medication Adjustments/Labs and Tests Ordered: Current medicines are reviewed at length with the patient today.  Concerns regarding medicines are outlined above. Labs and tests ordered and medication changes are outlined in the patient instructions below:  Patient Instructions  Medication Instructions: INCREASE: Lisinopril to 20 mg daily  Labwork: None  Procedures/Testing: None  Follow-Up: Your physician wants you to follow-up in: 6 months with Dr.Nelson You will receive a reminder letter in the mail two months in advance. If you don't receive a letter, please call our office to schedule the follow-up appointment.   Any Additional Special Instructions Will Be Listed Below (If Applicable).     If you need a refill on your cardiac medications before your next appointment, please call your pharmacy.      Signed, Daune Perch, NP  01/10/2018 9:45 AM    [facility]

## 2018-01-14 DIAGNOSIS — Z48815 Encounter for surgical aftercare following surgery on the digestive system: Secondary | ICD-10-CM | POA: Diagnosis not present

## 2018-01-14 DIAGNOSIS — Z9884 Bariatric surgery status: Secondary | ICD-10-CM | POA: Diagnosis not present

## 2018-01-14 DIAGNOSIS — E876 Hypokalemia: Secondary | ICD-10-CM | POA: Diagnosis not present

## 2018-01-15 ENCOUNTER — Telehealth: Payer: Self-pay | Admitting: Neurology

## 2018-01-15 NOTE — Telephone Encounter (Signed)
Pt. States she will schedule her 6 mo when she comes in for her infusion on 01/17/18

## 2018-01-16 ENCOUNTER — Telehealth: Payer: Self-pay | Admitting: Neurology

## 2018-01-16 ENCOUNTER — Ambulatory Visit (HOSPITAL_COMMUNITY)
Admission: RE | Admit: 2018-01-16 | Discharge: 2018-01-16 | Disposition: A | Discharge status: Home / Self Care | Payer: Medicare Other | Source: Ambulatory Visit | Attending: Neurology | Admitting: Neurology

## 2018-01-16 DIAGNOSIS — R531 Weakness: Secondary | ICD-10-CM | POA: Diagnosis not present

## 2018-01-16 DIAGNOSIS — M6281 Muscle weakness (generalized): Secondary | ICD-10-CM | POA: Insufficient documentation

## 2018-01-16 NOTE — Telephone Encounter (Signed)
VNA setting on January 16 2018:  Output Current (mA) 1.00 Signal Frequency (Hz) 20 Pulse Width (microsec) 250 Signal on Time (sec) 21 Signal off time (min) 5.0  Magnet Output current (mA) 1.25 Pulse Width (microsec) 250 Signal On Time (sec) 60  Model PULSE 102. Serial [id number] Implanted 2012-05-22.  Turned off May 23 2018. 12:19, settings were confirmed. She will return to turn the VNS back on after MRI scan.

## 2018-01-16 NOTE — Telephone Encounter (Signed)
The VNS was turned off August 14 2:35pm.

## 2018-01-22 ENCOUNTER — Ambulatory Visit (INDEPENDENT_AMBULATORY_CARE_PROVIDER_SITE_OTHER): Payer: Medicare Other | Admitting: Orthopaedic Surgery

## 2018-02-06 ENCOUNTER — Ambulatory Visit (INDEPENDENT_AMBULATORY_CARE_PROVIDER_SITE_OTHER): Payer: Medicare Other | Admitting: Orthopaedic Surgery

## 2018-02-06 ENCOUNTER — Encounter (INDEPENDENT_AMBULATORY_CARE_PROVIDER_SITE_OTHER): Payer: Self-pay | Admitting: Orthopaedic Surgery

## 2018-02-06 VITALS — BP 131/91 | HR 76 | Ht 65.5 in | Wt 230.0 lb

## 2018-02-06 DIAGNOSIS — R29898 Other symptoms and signs involving the musculoskeletal system: Secondary | ICD-10-CM | POA: Diagnosis not present

## 2018-02-06 DIAGNOSIS — M79601 Pain in right arm: Secondary | ICD-10-CM | POA: Diagnosis not present

## 2018-02-06 NOTE — Progress Notes (Signed)
Office Visit Note   Patient: [name]           Date of Birth: 08/31/1965           MRN: [id number] Visit Date: 02/06/2018              Requested by: Katherine Roan, MD [place], [place] [id number] PCP: Katherine Roan, MD   Assessment & Plan: Visit Diagnoses:  1. Right arm pain   2. Right arm weakness     Plan: Exam shows no evidence of radiculopathy upper or lower extremities.  I reviewed the brain MRI results.  She can return to Dr.Yan to consider EMGs nerve conduction velocities if he feels it is indicated.  No changes in her medications were recommended.  Follow-Up Instructions: No follow-ups on file.   Orders:  Orders Placed This Encounter  Procedures  . Ambulatory referral to Physical Medicine Rehab   No orders of the defined types were placed in this encounter.     Procedures: No procedures performed   Clinical Data: No additional findings.   Subjective: Chief Complaint  Patient presents with  . Right Arm - Pain, Weakness  . Right Leg - Pain, Weakness  . Lower Back - Pain    HPI 52 year old female went to the ER 01/01/2018 with altered mental status and inability to speak right sided facial weakness and right-sided weakness.  She has a history of seizures and was catatonic with equal grip and negative stroke scale in the emergency room.  She had increased levels of stress at home and normally takes Topamax for seizures. She had a vagal nerve stimulator that was turned off and MRI scan was performed on 01/16/2018.  MRI result was normal MRI of the brain.  Patient states she has right shoulder weakness right leg weakness.  She states she does not have full capacity of her right arm yet she extends it as she talks and describes what she cannot do and what she can do.  She states she has no strength in the right hand and she is right-handed.  She states sometimes her leg gives out and she is concerned she could fall.  She has trouble keeping her  balance.  She states her back pain is flared up and now both knees are bothering her.  She is not working has not worked in a few years.  Patient states she is disabled. Review of Systems As for GERD, hypertension, hypothyroidism, chronic diastolic heart failure, asthma, history of reported back pain, epilepsy, right-sided weakness described above, vagal nerve stimulator, otherwise negative as it pertains HPI 14 point review of systems.  Objective: Vital Signs: BP (!) 131/91   Pulse 76   Ht 5' 5.5" (1.664 m)   Wt 230 lb (104.3 kg)   BMI 37.69 kg/m   Physical Exam  Constitutional: She is oriented to person, place, and time. She appears well-developed.  HENT:  Head: Normocephalic.  Right Ear: External ear normal.  Left Ear: External ear normal.  Eyes: Pupils are equal, round, and reactive to light.  Neck: No tracheal deviation present. No thyromegaly present.  Cardiovascular: Normal rate.  Pulmonary/Chest: Effort normal.  Abdominal: Soft.  Neurological: She is alert and oriented to person, place, and time.  Skin: Skin is warm and dry.  Psychiatric: She has a normal mood and affect. Her behavior is normal.    Ortho Exam patient has intact upper remedy reflexes biceps triceps brachioradialis.  Intact lower  extremity reflexes.  Knee and ankle jerk are symmetrical 2+.  No atrophy of the upper lower extremities.  No thenar or hyperthenar atrophy.  Specialty Comments:  No specialty comments available.  Imaging: No results found.   PMFS History: Patient Active Problem List   Diagnosis Date Noted  . Right sided weakness 01/07/2018  . Muscle weakness (generalized) 01/07/2018  . Rectal pain 12/19/2017  . Flu-like symptoms 08/09/2017  . Diarrhea 08/09/2017  . OSA (obstructive sleep apnea) 07/26/2017  . Morbid obesity ([place]) 07/26/2017  . Visit for suture removal 04/20/2017  . Ear pain, bilateral 03/05/2017  . Arm pain, diffuse, right 01/19/2017  . Hydradenitis 11/09/2016  .  Irritant contact dermatitis due to other agents 11/09/2016  . Takotsubo cardiomyopathy 10/26/2016  . Low back pain 04/19/2016  . Abnormal weight gain 06/11/2015  . BMI 40.0-44.9, adult ([place]) 06/11/2015  . Asthma 08/24/2014  . Chronic diastolic heart failure ([place]) 06/19/2014  . Allergic rhinitis 05/04/2014  . Pharyngoesophageal dysphagia 01/30/2014  . Hypothyroidism 07/02/2013  . Chronic chest pain   . GERD (gastroesophageal reflux disease) 02/10/2013  . Biliary dyskinesia 12/30/2012  . Status post VNS (vagus nerve stimulator) placement 06/10/2012  . Refusal of blood transfusions as patient is Jehovah's Witness 05/20/2012  . Dyslipidemia 12/29/2011  . 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 Dyslipidemia, goal LDL below 70 05/17/2011  . Non-cardiac chest pain secondary to GERD 04/24/2011  . HTN, goal below 130/80 04/24/2011   Past Medical History:  Diagnosis Date  . Asthma   . Biliary dyskinesia    a. s/p cholecystectomy.  . 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 Abnl stress [place] 2008, f/u cath reportedly nl. b. ETT-Myoview 04/2011 - EKG changes but normal perfusion. Cor CT - no coronary calcium, no definite stenosis though mRCA not fully evaluated. c. 10/2011 - tn elevated in [place], LHC without CAD. Started on Ranexa, anti-anginals ?microvascular dz but later stopped while in hospital on abx.  . Complication of anesthesia    hard to wake up-had to be reminded to breath  . GERD (gastroesophageal reflux disease)    a. Severe.  Marland Kitchen HTN (hypertension)   . Hx of cardiovascular stress test    Lex Myoview 8/14:  Normal, EF 74%  . Hx of echocardiogram    Echo 3/16:  Mild LVH, EF 55-60%, Gr 1 DD, trivial MR, mild LAE, normal RVF  . Hypothyroid   . MI (myocardial infarction) ([place])   . Migraine   . Morbid obesity ([place]) 07/26/2017  . MRSA infection    a. After vagal nerve stimulator at [facility] - surgical site MRSA infection,  PICC placed.  . Obesity   . OSA (obstructive sleep apnea) 07/26/2017  . Palpitations    a. 08/2014: 48 hour holter with 2 PVCs otherwise normal.  . Seizure disorder ([place])    a. since childhood. b. s/p vagal nerve stimulator at [facility].  . Seizures ([place])     Family History  Problem Relation Age of Onset  . Coronary artery disease Mother 44  . Other Father        killed  . Kidney disease Father   . Coronary artery disease Maternal Grandmother   . Cancer Other   . Hypertension Other   . Stroke Other     Past Surgical History:  Procedure Laterality Date  . [place] N/A 06/01/2016  Procedure: Right Heart Cath;  Surgeon: Larey Dresser, MD;  Location: [facility];  Service: Cardiovascular;  Laterality: N/A;  . CESAREAN SECTION     placement of vagal nerve stimulator.  . CHOLECYSTECTOMY N/A 01/24/2013   Procedure: LAPAROSCOPIC CHOLECYSTECTOMY;  Surgeon: Harl Bowie, MD;  Location: [facility];  Service: General;  Laterality: N/A;  . IMPLANTATION VAGAL NERVE STIMULATOR  [phone]   [facility]   Social History   Occupational History  . Occupation: Disabled  Tobacco Use  . Smoking status: Never Smoker  . Smokeless tobacco: Never Used  Substance and Sexual Activity  . Alcohol use: Yes    Alcohol/week: 1.0 - 3.0 standard drinks    Types: 1 - 3 Glasses of wine per week    Comment: occasional  . Drug use: No  . Sexual activity: Yes    Birth control/protection: None

## 2018-02-06 NOTE — Addendum Note (Signed)
Addended by: Meyer Cory on: 02/06/2018 04:49 PM   Modules accepted: Orders

## 2018-02-13 DIAGNOSIS — Z888 Allergy status to other drugs, medicaments and biological substances status: Secondary | ICD-10-CM | POA: Diagnosis not present

## 2018-02-13 DIAGNOSIS — Z79899 Other long term (current) drug therapy: Secondary | ICD-10-CM | POA: Diagnosis not present

## 2018-02-13 DIAGNOSIS — Z91040 Latex allergy status: Secondary | ICD-10-CM | POA: Diagnosis not present

## 2018-02-13 DIAGNOSIS — Z780 Asymptomatic menopausal state: Secondary | ICD-10-CM | POA: Diagnosis not present

## 2018-02-13 DIAGNOSIS — Z9689 Presence of other specified functional implants: Secondary | ICD-10-CM | POA: Diagnosis not present

## 2018-02-13 DIAGNOSIS — G40919 Epilepsy, unspecified, intractable, without status epilepticus: Secondary | ICD-10-CM | POA: Diagnosis not present

## 2018-02-13 DIAGNOSIS — G40419 Other generalized epilepsy and epileptic syndromes, intractable, without status epilepticus: Secondary | ICD-10-CM | POA: Diagnosis not present

## 2018-02-13 DIAGNOSIS — Z886 Allergy status to analgesic agent status: Secondary | ICD-10-CM | POA: Diagnosis not present

## 2018-02-14 ENCOUNTER — Telehealth (INDEPENDENT_AMBULATORY_CARE_PROVIDER_SITE_OTHER): Payer: Self-pay | Admitting: *Deleted

## 2018-02-14 NOTE — Telephone Encounter (Signed)
Dr. Lorin Mercy had decided for patient to follow up with Dr. Krista Blue and that Dr. Krista Blue could order EMG/NCS if he felt they were warranted. This was in his note from her office visit.

## 2018-02-17 ENCOUNTER — Other Ambulatory Visit: Payer: Self-pay | Admitting: Internal Medicine

## 2018-02-19 NOTE — Telephone Encounter (Signed)
I left voicemail advising. Asked for return call if questions.

## 2018-03-05 ENCOUNTER — Ambulatory Visit (HOSPITAL_COMMUNITY)
Admission: RE | Admit: 2018-03-05 | Discharge: 2018-03-05 | Disposition: A | Discharge status: Home / Self Care | Payer: Medicare Other | Source: Ambulatory Visit | Attending: Internal Medicine | Admitting: Internal Medicine

## 2018-03-05 ENCOUNTER — Other Ambulatory Visit: Payer: Self-pay

## 2018-03-05 ENCOUNTER — Ambulatory Visit (INDEPENDENT_AMBULATORY_CARE_PROVIDER_SITE_OTHER): Payer: Self-pay | Admitting: Internal Medicine

## 2018-03-05 ENCOUNTER — Encounter: Payer: Self-pay | Admitting: Internal Medicine

## 2018-03-05 DIAGNOSIS — G8929 Other chronic pain: Secondary | ICD-10-CM

## 2018-03-05 DIAGNOSIS — M5416 Radiculopathy, lumbar region: Secondary | ICD-10-CM | POA: Insufficient documentation

## 2018-03-05 DIAGNOSIS — Z791 Long term (current) use of non-steroidal anti-inflammatories (NSAID): Secondary | ICD-10-CM

## 2018-03-05 DIAGNOSIS — M545 Low back pain: Secondary | ICD-10-CM | POA: Insufficient documentation

## 2018-03-05 DIAGNOSIS — G8911 Acute pain due to trauma: Secondary | ICD-10-CM

## 2018-03-05 DIAGNOSIS — M25551 Pain in right hip: Secondary | ICD-10-CM

## 2018-03-05 DIAGNOSIS — S79911A Unspecified injury of right hip, initial encounter: Secondary | ICD-10-CM | POA: Diagnosis not present

## 2018-03-05 DIAGNOSIS — G3189 Other specified degenerative diseases of nervous system: Secondary | ICD-10-CM | POA: Insufficient documentation

## 2018-03-05 DIAGNOSIS — S3992XA Unspecified injury of lower back, initial encounter: Secondary | ICD-10-CM | POA: Diagnosis not present

## 2018-03-05 MED ORDER — IBUPROFEN 800 MG PO TABS: 800.0000 mg | ORAL_TABLET | Freq: Three times a day (TID) | ORAL | 0 refills | Status: DC
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
## 2018-03-05 NOTE — Patient Instructions (Addendum)
FOLLOW-UP INSTRUCTIONS When: If your symptoms worsen of fail to improve in 1-2 weeks  Thank you for your visit to the [facility], [facility].  I have prescribed ibuprofen 800 mg to be taken 3 times per day scheduled for the pain.  In addition we will obtain x-rays of the lumbar vertebrae and right hip to better evaluate for potential fractures.   I will notify you of the results when they are available.  If you have any questions please feel free to notify us at any time.  As always, if your symptoms worsen, fail to improve in 10 to 14 days, we develop other concerning symptoms such as worsening sharp or stabbing pain radiating down the right or left leg, new onset worsening of weakness, or loss of bowel control please notify us or proceed to the local ER if we are unavailable.

## 2018-03-05 NOTE — Progress Notes (Signed)
Patient ID: [name], female   DOB: 04/15/1966, 52 y.o.   MRN: [id number]  Case discussed with Dr. Berline Lopes at the time of the visit. We reviewed the resident's history and exam and pertinent patient test results. I agree with the assessment, diagnosis, and plan of care documented in the resident's note.  The hip and lumbar spine X-rays were without acute fracture.  Agree with anti-inflammatory therapy.
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
## 2018-03-07 ENCOUNTER — Encounter: Payer: Self-pay | Admitting: Internal Medicine

## 2018-03-07 NOTE — Progress Notes (Signed)
Notified patient by mail of her negative hip plain films and the plan to continue conservative management.   Kathi Ludwig, MD Internal Medicine PGY-2 Pager # [phone]

## 2018-03-25 ENCOUNTER — Other Ambulatory Visit: Payer: Self-pay

## 2018-03-25 ENCOUNTER — Encounter: Payer: Self-pay | Admitting: Internal Medicine

## 2018-03-25 ENCOUNTER — Ambulatory Visit (INDEPENDENT_AMBULATORY_CARE_PROVIDER_SITE_OTHER): Payer: Medicare Other | Admitting: Internal Medicine

## 2018-03-25 DIAGNOSIS — M5116 Intervertebral disc disorders with radiculopathy, lumbar region: Secondary | ICD-10-CM | POA: Diagnosis not present

## 2018-03-25 DIAGNOSIS — M25551 Pain in right hip: Secondary | ICD-10-CM

## 2018-03-25 DIAGNOSIS — M5416 Radiculopathy, lumbar region: Secondary | ICD-10-CM

## 2018-03-25 DIAGNOSIS — G8911 Acute pain due to trauma: Secondary | ICD-10-CM | POA: Diagnosis not present

## 2018-03-25 DIAGNOSIS — R2689 Other abnormalities of gait and mobility: Secondary | ICD-10-CM

## 2018-03-25 DIAGNOSIS — G8929 Other chronic pain: Secondary | ICD-10-CM | POA: Diagnosis not present

## 2018-03-25 DIAGNOSIS — M1288 Other specific arthropathies, not elsewhere classified, other specified site: Secondary | ICD-10-CM

## 2018-03-25 MED ORDER — CYCLOBENZAPRINE HCL 5 MG PO TABS: 5.0000 mg | ORAL_TABLET | Freq: Two times a day (BID) | ORAL | 0 refills | Status: DC

## 2018-03-25 MED ORDER — GABAPENTIN 300 MG PO CAPS: 300.0000 mg | ORAL_CAPSULE | Freq: Every day | ORAL | 0 refills | Status: DC

## 2018-03-25 NOTE — Patient Instructions (Signed)
FOLLOW-UP INSTRUCTIONS When: Two weeks if your pain worsens or fails to improve What to bring: All of your medications  I have made changes to your medications today. Please stop the ibuprofen, start taking the Gabapentin once daily or at night if it causes drowsiness and take the flexeril up to twice daily as needed for muscle spasms.   I have placed an order for a CT myelogram. Pending the results we will proceed with either a referral to neurosurgery or physical therapy.  As always if your symptoms worsen, fail to improve, or you develop other concerning symptoms, please notify our office or visit the local ER if we are unavailable. Symptoms including loss of strength in either leg, loss of bowel or bladder control, fever, should not be ignored and should encourage you to visit the ED if we are unavailable by phone or the symptoms are severe.  Thank you for your visit to the [facility] today. If you have any questions or concerns please call us at [phone].
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
## 2018-03-26 DIAGNOSIS — M5416 Radiculopathy, lumbar region: Secondary | ICD-10-CM | POA: Insufficient documentation
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
## 2018-03-26 NOTE — Assessment & Plan Note (Signed)
See lumbar back pain with right leg radiculopathy from same date

## 2018-03-26 NOTE — Progress Notes (Signed)
Internal Medicine Clinic Attending  Case discussed with Dr. Harbrecht at the time of the visit.  We reviewed the resident's history and exam and pertinent patient test results.  I agree with the assessment, diagnosis, and plan of care documented in the resident's note.   

## 2018-04-02 DIAGNOSIS — R351 Nocturia: Secondary | ICD-10-CM | POA: Diagnosis not present

## 2018-04-02 DIAGNOSIS — R350 Frequency of micturition: Secondary | ICD-10-CM | POA: Diagnosis not present

## 2018-04-02 DIAGNOSIS — N390 Urinary tract infection, site not specified: Secondary | ICD-10-CM | POA: Diagnosis not present

## 2018-04-02 DIAGNOSIS — N3946 Mixed incontinence: Secondary | ICD-10-CM | POA: Diagnosis not present

## 2018-04-17 DIAGNOSIS — N3946 Mixed incontinence: Secondary | ICD-10-CM | POA: Diagnosis not present

## 2018-04-17 DIAGNOSIS — R350 Frequency of micturition: Secondary | ICD-10-CM | POA: Diagnosis not present

## 2018-04-17 DIAGNOSIS — R351 Nocturia: Secondary | ICD-10-CM | POA: Diagnosis not present

## 2018-04-18 DIAGNOSIS — E669 Obesity, unspecified: Secondary | ICD-10-CM | POA: Diagnosis not present

## 2018-04-18 DIAGNOSIS — Z48815 Encounter for surgical aftercare following surgery on the digestive system: Secondary | ICD-10-CM | POA: Diagnosis not present

## 2018-04-18 DIAGNOSIS — Z713 Dietary counseling and surveillance: Secondary | ICD-10-CM | POA: Diagnosis not present

## 2018-04-18 DIAGNOSIS — Z9884 Bariatric surgery status: Secondary | ICD-10-CM | POA: Diagnosis not present

## 2018-04-25 DIAGNOSIS — M25511 Pain in right shoulder: Secondary | ICD-10-CM | POA: Diagnosis not present

## 2018-04-29 ENCOUNTER — Other Ambulatory Visit: Payer: Self-pay | Admitting: Internal Medicine

## 2018-04-29 DIAGNOSIS — M5416 Radiculopathy, lumbar region: Secondary | ICD-10-CM

## 2018-05-01 ENCOUNTER — Other Ambulatory Visit: Payer: Self-pay | Admitting: Physician Assistant

## 2018-05-10 ENCOUNTER — Ambulatory Visit (HOSPITAL_COMMUNITY)
Admission: RE | Admit: 2018-05-10 | Discharge: 2018-05-10 | Disposition: A | Discharge status: Home / Self Care | Payer: Medicare Other | Source: Ambulatory Visit | Attending: Internal Medicine | Admitting: Internal Medicine

## 2018-05-10 DIAGNOSIS — M5136 Other intervertebral disc degeneration, lumbar region: Secondary | ICD-10-CM | POA: Insufficient documentation

## 2018-05-10 DIAGNOSIS — M5416 Radiculopathy, lumbar region: Secondary | ICD-10-CM | POA: Diagnosis not present

## 2018-05-10 DIAGNOSIS — M48061 Spinal stenosis, lumbar region without neurogenic claudication: Secondary | ICD-10-CM | POA: Insufficient documentation

## 2018-05-10 MED ORDER — ONDANSETRON HCL 4 MG/2ML IJ SOLN: 4.0000 mg | Freq: Four times a day (QID) | INTRAMUSCULAR | Status: DC | PRN

## 2018-05-10 MED ORDER — DIAZEPAM 5 MG PO TABS
ORAL_TABLET | ORAL | Status: AC
  Filled 2018-05-10: qty 2

## 2018-05-10 MED ORDER — DIAZEPAM 5 MG PO TABS
10.0000 mg | ORAL_TABLET | Freq: Once | ORAL | Status: AC
  Administered 2018-05-10: 10 mg via ORAL
  Filled 2018-05-10: qty 2

## 2018-05-10 MED ORDER — LIDOCAINE HCL (PF) 1 % IJ SOLN
5.0000 mL | Freq: Once | INTRAMUSCULAR | Status: AC
  Administered 2018-05-10: 5 mL via INTRADERMAL

## 2018-05-10 MED ORDER — IOPAMIDOL (ISOVUE-M 200) INJECTION 41%
20.0000 mL | Freq: Once | INTRAMUSCULAR | Status: AC
  Administered 2018-05-10: 15 mL via INTRATHECAL

## 2018-05-10 NOTE — Procedures (Signed)
Procedure: Lumbar Myelogram via LP at L3/4 with Fluoro guidance. Medications:  10 mg Valium PO for anxiolysis prior to procedure Specimen: None Bleeding: None. Complications: None immediate. Patient   -Condition: Stable.  -Disposition:  To CT, then Recovery, then d/c to home if recovery is uneventful.  Full Radiology Report to follow under IMAGING

## 2018-05-10 NOTE — Discharge Instructions (Signed)
Myelogram, Care After °These instructions give you information about caring for yourself after your procedure. Your doctor may also give you more specific instructions. Call your doctor if you have any problems or questions after your procedure. °Follow these instructions at home: °· Drink enough fluid to keep your pee (urine) clear or pale yellow. °· Rest as told by your doctor. °· Lie flat with your head slightly raised (elevated). °· Do not bend, lift, or do any hard activities for 24-48 hours or as told by your doctor. °· Take over-the-counter and prescription medicines only as told by your doctor. °· Take care of and remove your bandage (dressing) as told by your doctor. °· Bathe or shower as told by your doctor. °Contact a health care provider if: °· You have a fever. °· You have a headache that lasts longer than 24 hours. °· You feel sick to your stomach (nauseous). °· You throw up (vomit). °· Your neck is stiff. °· Your legs feel numb. °· You cannot pee. °· You cannot poop (have a bowel movement). °· You have a rash. °· You are itchy or sneezing. °Get help right away if: °· You have new symptoms or your symptoms get worse. °· You have a seizure. °· You have trouble breathing. °This information is not intended to replace advice given to you by your health care provider. Make sure you discuss any questions you have with your health care provider. °Document Released: 02/29/2008 Document Revised: 01/20/2016 Document Reviewed: 03/04/2015 °Elsevier Interactive Patient Education © 2018 Elsevier Inc. ° °

## 2018-05-13 ENCOUNTER — Other Ambulatory Visit: Payer: Self-pay

## 2018-05-13 ENCOUNTER — Ambulatory Visit (INDEPENDENT_AMBULATORY_CARE_PROVIDER_SITE_OTHER): Payer: Medicare Other | Admitting: Internal Medicine

## 2018-05-13 VITALS — BP 146/100 | HR 105

## 2018-05-13 DIAGNOSIS — M5117 Intervertebral disc disorders with radiculopathy, lumbosacral region: Secondary | ICD-10-CM

## 2018-05-13 DIAGNOSIS — G8929 Other chronic pain: Secondary | ICD-10-CM | POA: Diagnosis not present

## 2018-05-13 DIAGNOSIS — F329 Major depressive disorder, single episode, unspecified: Secondary | ICD-10-CM

## 2018-05-13 DIAGNOSIS — I10 Essential (primary) hypertension: Secondary | ICD-10-CM | POA: Diagnosis not present

## 2018-05-13 DIAGNOSIS — Z8679 Personal history of other diseases of the circulatory system: Secondary | ICD-10-CM

## 2018-05-13 DIAGNOSIS — M4807 Spinal stenosis, lumbosacral region: Secondary | ICD-10-CM

## 2018-05-13 DIAGNOSIS — M5416 Radiculopathy, lumbar region: Secondary | ICD-10-CM

## 2018-05-13 DIAGNOSIS — R55 Syncope and collapse: Secondary | ICD-10-CM

## 2018-05-13 DIAGNOSIS — Z79899 Other long term (current) drug therapy: Secondary | ICD-10-CM

## 2018-05-13 DIAGNOSIS — F32A Depression, unspecified: Secondary | ICD-10-CM

## 2018-05-13 HISTORY — DX: Syncope and collapse: R55

## 2018-05-13 MED ORDER — FUROSEMIDE 20 MG PO TABS: 20.0000 mg | ORAL_TABLET | Freq: Every day | ORAL | 2 refills | Status: DC

## 2018-05-13 NOTE — Patient Instructions (Addendum)
Thank you for allowing [place] to care for you  For your blood pressure - We have changed your dose of Lasix from 40 to 20mg  Daily - Continue your other medications  For your episodes of passing out - We have ordered an event monitor to monitor your heart rhythm - We have ordered Ultrasound of your heart to evaluate any changes since your last one  For your Back pain we have ordered a PT referral  We have also referred you to out counselor  Please return as scheduled for an appointment with you PCP  Please schedule an appointment with Cardiology

## 2018-05-13 NOTE — Progress Notes (Signed)
   CC: Hypertension, Back Pain, Syncope, Depression  HPI:  [name] is a 52 y.o. F with PMHx listed below presenting for Hypertension, Back Pain, Syncope, Depression. Please see the A&P for the status of the patient's chronic medical problems.   Past Medical History:  Diagnosis Date  . Asthma   . Biliary dyskinesia    a. s/p cholecystectomy.  . 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 Abnl stress [place] 2008, f/u cath reportedly nl. b. ETT-Myoview 04/2011 - EKG changes but normal perfusion. Cor CT - no coronary calcium, no definite stenosis though mRCA not fully evaluated. c. 10/2011 - tn elevated in [place], LHC without CAD. Started on Ranexa, anti-anginals ?microvascular dz but later stopped while in hospital on abx.  . Complication of anesthesia    hard to wake up-had to be reminded to breath  . GERD (gastroesophageal reflux disease)    a. Severe.  Marland Kitchen HTN (hypertension)   . Hx of cardiovascular stress test    [place] Myoview 8/14:  Normal, EF 74%  . Hx of echocardiogram    Echo 3/16:  Mild LVH, EF 55-60%, Gr 1 DD, trivial MR, mild LAE, normal RVF  . Hypothyroid   . MI (myocardial infarction) ([place])   . Migraine   . Morbid obesity ([place]) 07/26/2017  . MRSA infection    a. After vagal nerve stimulator at [facility] - surgical site MRSA infection, PICC placed.  . Obesity   . OSA (obstructive sleep apnea) 07/26/2017  . Palpitations    a. 08/2014: 48 hour holter with 2 PVCs otherwise normal.  . Seizure disorder ([place])    a. since childhood. b. s/p vagal nerve stimulator at [facility].  . Seizures ([place])    Review of Systems:  Performed and all others negative.  Physical Exam:  Vitals:   05/13/18 0928 05/13/18 0932 05/13/18 0933 05/13/18 0936  BP: (!) 138/94 (!) 153/103 (!) 138/97 (!) 146/100  Pulse: 97 93 95 (!) 105  SpO2: 98% 100% 100% 100%   Physical Exam  Constitutional: She appears well-developed and well-nourished. No distress.  Cardiovascular: Normal  rate, regular rhythm, normal heart sounds and intact distal pulses.  Pulmonary/Chest: Effort normal and breath sounds normal. No respiratory distress.  Abdominal: Soft. Bowel sounds are normal. She exhibits no distension.  Musculoskeletal: She exhibits no edema or deformity.  Skin: Skin is warm and dry.     Assessment & Plan:   See Encounters Tab for problem based charting.  Patient discussed with Dr. Lynnae January

## 2018-05-14 ENCOUNTER — Encounter: Payer: Self-pay | Admitting: Internal Medicine

## 2018-05-14 ENCOUNTER — Telehealth: Payer: Self-pay | Admitting: Licensed Clinical Social Worker

## 2018-05-14 DIAGNOSIS — F32A Depression, unspecified: Secondary | ICD-10-CM | POA: Insufficient documentation

## 2018-05-14 DIAGNOSIS — F332 Major depressive disorder, recurrent severe without psychotic features: Secondary | ICD-10-CM | POA: Insufficient documentation

## 2018-05-14 DIAGNOSIS — F329 Major depressive disorder, single episode, unspecified: Secondary | ICD-10-CM | POA: Insufficient documentation

## 2018-05-14 NOTE — Assessment & Plan Note (Signed)
Patient scored 15 on PHQ-9 screening today. She is interested counseling. - Referral to [facility]
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
## 2018-05-14 NOTE — Assessment & Plan Note (Addendum)
Patient present, in part, for BP recheck. Her BP today is 146/100 and has been somewhat labile in the past. She does admit to only taking her Lasix intermittently (a few days a week) as she feels this dehydrates her; which may explain some of the variability in her BP. She states she has taken all of her morning medications today. - Lasix dose reduced to 20mg  Daily to encourage compliance - Coreg 25mg  BID - Lisinopril 20mg  Daily - Spironolactone 25mg  Daily

## 2018-05-14 NOTE — Telephone Encounter (Signed)
Patient was contacted due to a recent referral for [facility] services. Patient did not answer, and a voicemail was left for the patient.

## 2018-05-14 NOTE — Progress Notes (Signed)
Internal Medicine Clinic Attending  Case discussed with Dr. Melvin  at the time of the visit.  We reviewed the resident's history and exam and pertinent patient test results.  I agree with the assessment, diagnosis, and plan of care documented in the resident's note.  

## 2018-05-14 NOTE — Assessment & Plan Note (Signed)
Patient reports 2 episodes of syncope in the past couple of months. The first episode occurred about 2 months ago when she was getting out of her care and became light headed upon standing. The second episode occurred last Tuesday while she was standing at the bank. During this episode she began to feel warm, flush, maybe tachycardic; she then lost consciousness for a few seconds. She denies any confusion following either of the 2 episodes; nor loss of bowl or bladder.  Her fist episode sounds consistent with orthostatic hypotension, though ortho statics today were negative. Her second episode sounds most consistent with a vasovagal episode given described prodrome. She does endorse intermittent (1-2 times per week) heart palpitations and has a history of HFpEF with last Echo in 2017. Will order an event monitor and Repeat echo for further evaluation for arrhythmogenic and mechanical cardiac sources of her syncope. - Echocardiogram Complete - Cardiac Event Monitor - Return precautions given

## 2018-05-20 ENCOUNTER — Encounter: Payer: Self-pay | Admitting: *Deleted

## 2018-05-22 ENCOUNTER — Ambulatory Visit (INDEPENDENT_AMBULATORY_CARE_PROVIDER_SITE_OTHER): Payer: Medicare Other

## 2018-05-22 DIAGNOSIS — R55 Syncope and collapse: Secondary | ICD-10-CM | POA: Diagnosis not present

## 2018-05-23 DIAGNOSIS — R55 Syncope and collapse: Secondary | ICD-10-CM | POA: Diagnosis not present

## 2018-06-04 ENCOUNTER — Other Ambulatory Visit: Payer: Self-pay | Admitting: Cardiology

## 2018-06-06 ENCOUNTER — Encounter: Payer: Self-pay | Admitting: Physical Therapy

## 2018-06-06 ENCOUNTER — Ambulatory Visit: Payer: Medicare Other | Attending: Internal Medicine | Admitting: Physical Therapy

## 2018-06-06 ENCOUNTER — Other Ambulatory Visit: Payer: Self-pay

## 2018-06-06 DIAGNOSIS — M6281 Muscle weakness (generalized): Secondary | ICD-10-CM | POA: Insufficient documentation

## 2018-06-06 DIAGNOSIS — R262 Difficulty in walking, not elsewhere classified: Secondary | ICD-10-CM

## 2018-06-06 DIAGNOSIS — M545 Low back pain: Secondary | ICD-10-CM | POA: Diagnosis not present

## 2018-06-06 DIAGNOSIS — G8929 Other chronic pain: Secondary | ICD-10-CM | POA: Insufficient documentation

## 2018-06-06 DIAGNOSIS — M62830 Muscle spasm of back: Secondary | ICD-10-CM | POA: Insufficient documentation

## 2018-06-06 DIAGNOSIS — M5416 Radiculopathy, lumbar region: Secondary | ICD-10-CM | POA: Diagnosis not present
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
## 2018-06-11 ENCOUNTER — Ambulatory Visit: Payer: Medicare Other | Admitting: Licensed Clinical Social Worker

## 2018-06-17 ENCOUNTER — Telehealth: Payer: Self-pay | Admitting: Physical Therapy

## 2018-06-17 ENCOUNTER — Ambulatory Visit: Payer: Medicare Other | Admitting: Physical Therapy

## 2018-06-17 NOTE — Telephone Encounter (Signed)
LVM regarding missed appointment today. Noted when her next appointment date / time is and if she is unable to make that appointment to call us and we can cancel or reschedule that appointment for her.

## 2018-06-18 ENCOUNTER — Encounter: Payer: Medicare Other | Admitting: Physical Therapy

## 2018-06-19 ENCOUNTER — Ambulatory Visit: Payer: Medicare Other | Admitting: Physical Therapy

## 2018-06-24 ENCOUNTER — Ambulatory Visit: Payer: Medicare Other | Admitting: Physical Therapy

## 2018-06-25 ENCOUNTER — Ambulatory Visit: Payer: Medicare Other | Admitting: Physical Therapy

## 2018-06-25 ENCOUNTER — Telehealth: Payer: Self-pay | Admitting: Physical Therapy

## 2018-06-25 NOTE — Telephone Encounter (Signed)
Left voice message about 2nd missed visit today.  Phone number given for her to call if she cannot make her next appointment or if she is no longer interested in Physical therapy.  Date and time of the next appointment given. Patient asked to refer to the policy for attendance.  Melvenia Needles PTA

## 2018-06-26 ENCOUNTER — Encounter: Payer: Medicare Other | Admitting: Physical Therapy

## 2018-06-27 ENCOUNTER — Ambulatory Visit: Payer: Medicare Other | Admitting: Physical Therapy

## 2018-06-27 ENCOUNTER — Encounter: Payer: Self-pay | Admitting: Physical Therapy

## 2018-06-27 DIAGNOSIS — M6281 Muscle weakness (generalized): Secondary | ICD-10-CM | POA: Diagnosis not present

## 2018-06-27 DIAGNOSIS — M62830 Muscle spasm of back: Secondary | ICD-10-CM

## 2018-06-27 DIAGNOSIS — R262 Difficulty in walking, not elsewhere classified: Secondary | ICD-10-CM

## 2018-06-27 DIAGNOSIS — M545 Low back pain, unspecified: Secondary | ICD-10-CM

## 2018-06-27 DIAGNOSIS — M5416 Radiculopathy, lumbar region: Secondary | ICD-10-CM

## 2018-06-27 DIAGNOSIS — G8929 Other chronic pain: Secondary | ICD-10-CM

## 2018-06-27 NOTE — Therapy (Signed)
[facility] [address] [place], [place], [address] Phone: [phone]   Fax:  [phone]  Physical Therapy Treatment  Patient Details  Name: [name] MRN: [id number] Date of Birth: Dec 24, 1965 Referring Provider (PT): Beola Cord, MD   Encounter Date: 06/27/2018  PT End of Session - 06/27/18 0850    Visit Number  2    Number of Visits  12    Date for PT Re-Evaluation  07/18/18    Authorization Type  BCBS MCR    PT Start Time  0801    PT Stop Time  0845    PT Time Calculation (min)  44 min    Activity Tolerance  Patient tolerated treatment well    Behavior During Therapy  [facility] for tasks assessed/performed       Past Medical History:  Diagnosis Date  . Asthma   . Biliary dyskinesia    a. s/p cholecystectomy.  . 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 Abnl stress [place] 2008, f/u cath reportedly nl. b. ETT-Myoview 04/2011 - EKG changes but normal perfusion. Cor CT - no coronary calcium, no definite stenosis though mRCA not fully evaluated. c. 10/2011 - tn elevated in [place], LHC without CAD. Started on Ranexa, anti-anginals ?microvascular dz but later stopped while in hospital on abx.  . Complication of anesthesia    hard to wake up-had to be reminded to breath  . GERD (gastroesophageal reflux disease)    a. Severe.  Marland Kitchen HTN (hypertension)   . Hx of cardiovascular stress test    Lex Myoview 8/14:  Normal, EF 74%  . Hx of echocardiogram    Echo 3/16:  Mild LVH, EF 55-60%, Gr 1 DD, trivial MR, mild LAE, normal RVF  . Hypothyroid   . MI (myocardial infarction) (HCC)   . Migraine   . Morbid obesity (HCC) 07/26/2017  . MRSA infection    a. After vagal nerve stimulator at [facility] - surgical site MRSA infection, PICC placed.  . Obesity   . OSA (obstructive sleep apnea) 07/26/2017  . Palpitations    a. 08/2014: 48 hour holter with 2 PVCs otherwise normal.  . Seizure disorder (HCC)    a. since  childhood. b. s/p vagal nerve stimulator at [facility].  . Seizures (HCC)     Past Surgical History:  Procedure Laterality Date  . CARDIAC CATHETERIZATION     [facility]   . CARDIAC CATHETERIZATION N/A 06/01/2016   Procedure: Right Heart Cath;  Surgeon: Laurey Morale, MD;  Location: [facility] INVASIVE CV LAB;  Service: Cardiovascular;  Laterality: N/A;  . CESAREAN SECTION     placement of vagal nerve stimulator.  . CHOLECYSTECTOMY N/A 01/24/2013   Procedure: LAPAROSCOPIC CHOLECYSTECTOMY;  Surgeon: Shelly Rubenstein, MD;  Location: [facility];  Service: General;  Laterality: N/A;  . IMPLANTATION VAGAL NERVE STIMULATOR  [phone]   [facility]    There were no vitals filed for this visit.  Subjective Assessment - 06/27/18 0810    Subjective  Sore  due to doing a lot of walking and activity with family member.  I have not been doing any lifting.  I have  missed visits due to death in family    Currently in Pain?  Yes    Pain Score  8     Pain Location  Back    Pain Orientation  Lower    Pain Descriptors / Indicators  Aching  inflamed   Pain Radiating Towards  anterior thighs  both equally    Pain Frequency  Constant    Aggravating Factors   walking,  bending over,  sitting, standing  too long    Pain Relieving Factors  changes in position,  warm shower,  rubbing legs and back,  icy hot patch    Effect of Pain on Daily Activities  cant get comfortable,   not lifting,  limit walking,  limit sitting / standing    Multiple Pain Sites  --   right shoulder worse with moving anterior joint.  Hurts.  going on since a long time.   MD knows about.  not moving it.                        [facility] Adult PT Treatment/Exercise - 06/27/18 0001      Self-Care   Self-Care  Lifting;Other Self-Care Comments    Lifting  purse off floor,  golfer's lift practice able to do with less pain    Other Self-Care Comments   Find time for herself to do the exercises       Lumbar Exercises: Stretches   Active Hamstring Stretch  Right;Left;3 reps;30 seconds    Active Hamstring Stretch Limitations  cues,  HEP    Single Knee to Chest Stretch  Right;Left;5 reps   5-10 second holds     Lumbar Exercises: Supine   Pelvic Tilt  10 reps    Pelvic Tilt Limitations  anterior and posterior   she does this sitting too at home.    Clam  10 reps    Clam Limitations  Red Theraband   cued initially   Bent Knee Raise  10 reps    Bent Knee Raise Limitations  pelvis rocks side to side  cued to control    Bridge  10 reps             PT Education - 06/27/18 0843    Education Details  HEP  lifting    Person(s) Educated  Patient    Methods  Explanation;Demonstration;Verbal cues;Handout    Comprehension  Returned demonstration;Verbalized understanding;Need further instruction       PT Short Term Goals - 06/27/18 0827      PT SHORT TERM GOAL #1   Title  Independent with HEP    Baseline  needs cues    Time  3    Period  Weeks    Status  On-going      PT SHORT TERM GOAL #2   Title  Tolerate ambulation at least 10-15 minutes for chores at home, brief community mobility for shopping    Baseline  3 minutes to 4 minutes walking  stands 5 to 10 minutes dishes and cooking    Time  3    Period  Weeks    Status  On-going      PT SHORT TERM GOAL #3   Title  Centralize right LE pain proximally to at least knee region to improve tolerance for ambulation with decreased leg pain    Baseline  to thighs    Time  3    Period  Weeks    Status  On-going        PT Long Term Goals - 06/06/18 2033      PT LONG TERM GOAL #1   Title  Improve FOTO score to 59% or less limitation    Baseline  78% limited  Time  6    Period  Weeks    Status  New    Target Date  07/18/18      PT LONG TERM GOAL #2   Title  Tolerate standing/ambulation for periods at least 30 minutes for activities such as grocery shopping with pain <5/10    Baseline  standing tolerance limited  to 15 minutes, difficulty tolerating prolonged ambulation for community mobility, unable to walk without significant pain increase    Time  6    Period  Weeks    Status  On-going    Target Date  07/18/18      PT LONG TERM GOAL #3   Title  Increase bilat. LE strength grossly 1/2 MMT grade to improve ability for lifting for chores, improve stability for gait/to decrease fall risk    Baseline  grossly 4/5-see flowsheet    Time  6    Period  Weeks    Status  New    Target Date  07/18/18      PT LONG TERM GOAL #4   Title  No further need RW for ambulation/independent community ambulation without AD    Baseline  reports intermittent use RW    Time  6    Period  Weeks    Status  New    Target Date  07/18/18            Plan - 06/27/18 0831    Clinical Impression Statement  Cues for current HEP needed.  Pain continues in back and legs to thighs vs feet.    She uses walker outside and inside if she needs to be standing longer.    Progress HEP today.  patient able to lift purse without pain increase after instruction.     PT Next Visit Plan  NO ESTIM-implanted vagal nerve stimulator, review HEP as needed, continue trial flexion bias ROM, lumbopelvic stabilization and strengthening, stretch Rt lateral hipand hamstrings, STM/roll right gluteus medius/TFL and piriformis, potential dry needling to hip, cryo or heat as needed    PT Home Exercise Plan  pelvic tilts, SKTC, abd. bracing with marches, hip bridge, clamshell, right lateral hip/IT band stretch,  Hamstring stretch    Consulted and Agree with Plan of Care  Patient       Patient will benefit from skilled therapeutic intervention in order to improve the following deficits and impairments:     Visit Diagnosis: Radiculopathy, lumbar region  Muscle weakness (generalized)  Difficulty in walking, not elsewhere classified  Chronic bilateral low back pain without sciatica  Muscle spasm of back     Problem List Patient Active  Problem List   Diagnosis Date Noted  . Depression 05/14/2018  . Syncope 05/13/2018  . Lumbar radiculopathy 03/26/2018  . Lumbar back pain with radiculopathy affecting right lower extremity 03/05/2018  . Right sided weakness 01/07/2018  . Muscle weakness (generalized) 01/07/2018  . Rectal pain 12/19/2017  . Flu-like symptoms 08/09/2017  . Diarrhea 08/09/2017  . OSA (obstructive sleep apnea) 07/26/2017  . Morbid obesity (HCC) 07/26/2017  . Visit for suture removal 04/20/2017  . Ear pain, bilateral 03/05/2017  . Arm pain, diffuse, right 01/19/2017  . Hydradenitis 11/09/2016  . Irritant contact dermatitis due to other agents 11/09/2016  . Takotsubo cardiomyopathy 10/26/2016  . Low back pain 04/19/2016  . Abnormal weight gain 06/11/2015  . BMI 40.0-44.9, adult (HCC) 06/11/2015  . Asthma 08/24/2014  . Chronic diastolic heart failure (HCC) 06/19/2014  . Allergic rhinitis 05/04/2014  .  Pharyngoesophageal dysphagia 01/30/2014  . Hypothyroidism 07/02/2013  . Chronic chest pain   . GERD (gastroesophageal reflux disease) 02/10/2013  . Biliary dyskinesia 12/30/2012  . Status post VNS (vagus nerve stimulator) placement 06/10/2012  . Refusal of blood transfusions as patient is Jehovah's Witness 05/20/2012  . Dyslipidemia 12/29/2011  . 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 Dyslipidemia, goal LDL below 70 05/17/2011  . Non-cardiac chest pain secondary to GERD 04/24/2011  . HTN, goal below 130/80 04/24/2011    Jorryn Casagrande   PTA 06/27/2018, 8:52 AM  [facility] [address] [place], [place], [address] Phone: [phone]   Fax:  [phone]  Name: [name] MRN: [id number] Date of Birth: 08/21/1965

## 2018-06-27 NOTE — Patient Instructions (Signed)
Leg Extension (Hamstring)    Sit toward front edge of chair, with leg out straight, heel on floor, toes pointing toward body. Keeping back straight, bend forward at hip.  Hold 30 seconds Repeat __3   times. Repeat with other leg. Do _1__ sessions per day. Variation: Perform from standing position, with support.  Copyright  VHI. All rights reserved.

## 2018-06-28 ENCOUNTER — Telehealth: Payer: Self-pay | Admitting: *Deleted

## 2018-06-28 MED ORDER — DILTIAZEM HCL ER COATED BEADS 120 MG PO CP24: 120.0000 mg | ORAL_CAPSULE | Freq: Every day | ORAL | 3 refills | Status: DC

## 2018-06-28 NOTE — Telephone Encounter (Signed)
-----   Message from Dorothy Spark, MD sent at 06/28/2018  9:00 AM EST ----- Re: 30 day event monitor  I would try Cardizem CD 120 mg po daily

## 2018-06-28 NOTE — Telephone Encounter (Signed)
Spoke with the pt and endorsed to her that Dr Meda Coffee reviewed her 30 day event monitor Daune Perch NP ordered for her to have, and based on the results, she did stay in sinus rhythm, but had periods of tachycardia, in which she activated symptoms on the monitor, which correlated with this.  Informed the pt that given that data, being she is already on a high dose of carvedilol, she recommends that we add Cardizem CD 120 mg po daily to her regimen. Confirmed the pharmacy of choice with the pt.  Pt verbalized understanding and agrees with this plan.   Off note:  Noted that the pt has an allergy to amlodipine while signing the order for her new med Cardizem CD.  Spoke with pt and she endorsed that she only had lower extremity edema with amlodipine, with no airway swelling involved.  Endorsed this also to Dr Meda Coffee, and she okayed for the pt to continue with taking new med Cardizem CD 120 mg po daily.  Endorsed to the pt that if she has any swelling with this new med, she should call the office and inform us of this.  Pt verbalized understanding and agreed with this plan.

## 2018-07-01 ENCOUNTER — Encounter: Payer: Medicare Other | Admitting: Physical Therapy

## 2018-07-02 ENCOUNTER — Ambulatory Visit: Payer: Medicare Other | Admitting: Physical Therapy

## 2018-07-02 ENCOUNTER — Encounter: Payer: Self-pay | Admitting: Physical Therapy

## 2018-07-02 DIAGNOSIS — M5416 Radiculopathy, lumbar region: Secondary | ICD-10-CM

## 2018-07-02 DIAGNOSIS — M6281 Muscle weakness (generalized): Secondary | ICD-10-CM | POA: Diagnosis not present

## 2018-07-02 DIAGNOSIS — R262 Difficulty in walking, not elsewhere classified: Secondary | ICD-10-CM | POA: Diagnosis not present

## 2018-07-02 DIAGNOSIS — M62830 Muscle spasm of back: Secondary | ICD-10-CM | POA: Diagnosis not present

## 2018-07-02 DIAGNOSIS — G8929 Other chronic pain: Secondary | ICD-10-CM | POA: Diagnosis not present

## 2018-07-02 DIAGNOSIS — M545 Low back pain, unspecified: Secondary | ICD-10-CM

## 2018-07-02 NOTE — Therapy (Signed)
[place], [place], [address] Phone: [phone]   Fax:  [phone]  Physical Therapy Treatment  Patient Details  Name: [name] MRN: [id number] Date of Birth: [birth year] Referring Provider (PT): Neva Seat, MD   Encounter Date: 07/02/2018  PT End of Session - 07/02/18 0908    Visit Number  3    Number of Visits  12    Date for PT Re-Evaluation  07/18/18    Authorization Type  BCBS MCR    PT Start Time  6384   Pt late for tx.   PT Stop Time  0845    PT Time Calculation (min)  33 min    Activity Tolerance  Patient limited by pain    Behavior During Therapy  [facility] for tasks assessed/performed       Past Medical History:  Diagnosis Date  . Asthma   . Biliary dyskinesia    a. s/p cholecystectomy.  . 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 Abnl stress [place] 2008, f/u cath reportedly nl. b. ETT-Myoview 04/2011 - EKG changes but normal perfusion. Cor CT - no coronary calcium, no definite stenosis though mRCA not fully evaluated. c. 10/2011 - tn elevated in [place], LHC without CAD. Started on Ranexa, anti-anginals ?microvascular dz but later stopped while in hospital on abx.  . Complication of anesthesia    hard to wake up-had to be reminded to breath  . GERD (gastroesophageal reflux disease)    a. Severe.  Marland Kitchen HTN (hypertension)   . Hx of cardiovascular stress test    Lex Myoview 8/14:  Normal, EF 74%  . Hx of echocardiogram    Echo 3/16:  Mild LVH, EF 55-60%, Gr 1 DD, trivial MR, mild LAE, normal RVF  . Hypothyroid   . MI (myocardial infarction) ([place])   . Migraine   . Morbid obesity ([place]) 07/26/2017  . MRSA infection    a. After vagal nerve stimulator at [facility] - surgical site MRSA infection, PICC placed.  . Obesity   . OSA (obstructive sleep apnea) 07/26/2017  . Palpitations    a. 08/2014: 48 hour holter with 2 PVCs otherwise normal.  . Seizure disorder ([place])    a.  since childhood. b. s/p vagal nerve stimulator at [facility]  . Seizures ([place])     Past Surgical History:  Procedure Laterality Date  . [place] N/A 06/01/2016   Procedure: Right Heart Cath;  Surgeon: Larey Dresser, MD;  Location: [place] CV LAB;  Service: Cardiovascular;  Laterality: N/A;  . CESAREAN SECTION     placement of vagal nerve stimulator.  . CHOLECYSTECTOMY N/A 01/24/2013   Procedure: LAPAROSCOPIC CHOLECYSTECTOMY;  Surgeon: Harl Bowie, MD;  Location: [place];  Service: General;  Laterality: N/A;  . IMPLANTATION VAGAL NERVE STIMULATOR  [phone]   [facility]    There were no vitals filed for this visit.  Subjective Assessment - 07/02/18 0815    Subjective  Pt reports not being able to walk much yesterday and having pain all over. Pt reports doing some exercise yesterday to relieve pain, but it did not help.     Currently in Pain?  Yes    Pain Score  10-Worst pain ever    Pain Location  --   Whole body pain   Pain Orientation  Other (Comment)   All over  Pain Descriptors / Indicators  --   Pins and needles   Pain Frequency  Constant    Aggravating Factors   walking, bending over, sitting, standing too long    Pain Relieving Factors  warm bath                       [facility] Adult PT Treatment/Exercise - 07/02/18 0001      Lumbar Exercises: Stretches   Single Knee to Chest Stretch  Right;Left;5 reps   5-10 second holds   Piriformis Stretch  2 reps;Right      Lumbar Exercises: Supine   Clam  20 reps    Clam Limitations  Red Theraband   cued initially   Bent Knee Raise  10 reps    Bent Knee Raise Limitations  pelvis rocks side to side  cued to control    Other Supine Lumbar Exercises  Single leg clam in hook lying; both sides x10               PT Short Term Goals - 06/27/18 0827      PT SHORT TERM GOAL #1   Title  Independent  with HEP    Baseline  needs cues    Time  3    Period  Weeks    Status  On-going      PT SHORT TERM GOAL #2   Title  Tolerate ambulation at least 10-15 minutes for chores at home, brief community mobility for shopping    Baseline  3 minutes to 4 minutes walking  stands 5 to 10 minutes dishes and cooking    Time  3    Period  Weeks    Status  On-going      PT SHORT TERM GOAL #3   Title  Centralize right LE pain proximally to at least knee region to improve tolerance for ambulation with decreased leg pain    Baseline  to thighs    Time  3    Period  Weeks    Status  On-going        PT Long Term Goals - 06/06/18 2033      PT LONG TERM GOAL #1   Title  Improve FOTO score to 59% or less limitation    Baseline  78% limited    Time  6    Period  Weeks    Status  New    Target Date  07/18/18      PT LONG TERM GOAL #2   Title  Tolerate standing/ambulation for periods at least 30 minutes for activities such as grocery shopping with pain <5/10    Baseline  standing tolerance limited to 15 minutes, difficulty tolerating prolonged ambulation for community mobility, unable to walk without significant pain increase    Time  6    Period  Weeks    Status  On-going    Target Date  07/18/18      PT LONG TERM GOAL #3   Title  Increase bilat. LE strength grossly 1/2 MMT grade to improve ability for lifting for chores, improve stability for gait/to decrease fall risk    Baseline  grossly 4/5-see flowsheet    Time  6    Period  Weeks    Status  New    Target Date  07/18/18      PT LONG TERM GOAL #4   Title  No further need RW for ambulation/independent community ambulation without AD  Baseline  reports intermittent use RW    Time  6    Period  Weeks    Status  New    Target Date  07/18/18            Plan - 07/02/18 1300    Clinical Impression Statement  Pt was c/o pins and needles type pain all over her body today. She said she began feeling bad yesterday and when she  started moving for ther ex today, she became nauseous. Her nausea subsided throughout tx, but intermittent breaks were taken to reduce symptoms.     PT Next Visit Plan  NO ESTIM-implanted vagal nerve stimulator, review HEP as needed, continue trial flexion bias ROM, lumbopelvic stabilization and strengthening, stretch Rt lateral hipand hamstrings, STM/roll right gluteus medius/TFL and piriformis, potential dry needling to hip, cryo or heat as needed    PT Home Exercise Plan  pelvic tilts, SKTC, abd. bracing with marches, hip bridge, clamshell, right lateral hip/IT band stretch,  Hamstring stretch    Consulted and Agree with Plan of Care  Patient     During this treatment session, the therapist was present, participating in and directing the treatment.  Patient will benefit from skilled therapeutic intervention in order to improve the following deficits and impairments:     Visit Diagnosis: Radiculopathy, lumbar region  Muscle weakness (generalized)  Difficulty in walking, not elsewhere classified  Chronic bilateral low back pain without sciatica  Muscle spasm of back     Problem List Patient Active Problem List   Diagnosis Date Noted  . Depression 05/14/2018  . Syncope 05/13/2018  . Lumbar radiculopathy 03/26/2018  . Lumbar back pain with radiculopathy affecting right lower extremity 03/05/2018  . Right sided weakness 01/07/2018  . Muscle weakness (generalized) 01/07/2018  . Rectal pain 12/19/2017  . Flu-like symptoms 08/09/2017  . Diarrhea 08/09/2017  . OSA (obstructive sleep apnea) 07/26/2017  . Morbid obesity ([place]) 07/26/2017  . Visit for suture removal 04/20/2017  . Ear pain, bilateral 03/05/2017  . Arm pain, diffuse, right 01/19/2017  . Hydradenitis 11/09/2016  . Irritant contact dermatitis due to other agents 11/09/2016  . Takotsubo cardiomyopathy 10/26/2016  . Low back pain 04/19/2016  . Abnormal weight gain 06/11/2015  . BMI 40.0-44.9, adult ([place]) 06/11/2015  .  Asthma 08/24/2014  . Chronic diastolic heart failure ([place]) 06/19/2014  . Allergic rhinitis 05/04/2014  . Pharyngoesophageal dysphagia 01/30/2014  . Hypothyroidism 07/02/2013  . Chronic chest pain   . GERD (gastroesophageal reflux disease) 02/10/2013  . Biliary dyskinesia 12/30/2012  . Status post VNS (vagus nerve stimulator) placement 06/10/2012  . Refusal of blood transfusions as patient is Jehovah's Witness 05/20/2012  . Dyslipidemia 12/29/2011  . 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 Dyslipidemia, goal LDL below 70 05/17/2011  . Non-cardiac chest pain secondary to GERD 04/24/2011  . HTN, goal below 130/80 04/24/2011    Fuller Mandril, SPTA 07/02/2018, 1:05 PM   Hessie Diener, PTA 07/02/18 1:59 PM Phone: [phone] Fax: [facility] [address] [place], [place], [address] Phone: [phone]   Fax:  [phone]  Name: [name] MRN: [id number] Date of Birth: May 28, 1966

## 2018-07-03 ENCOUNTER — Encounter: Payer: Medicare Other | Admitting: Physical Therapy

## 2018-07-04 ENCOUNTER — Ambulatory Visit: Payer: Medicare Other | Admitting: Physical Therapy

## 2018-07-08 ENCOUNTER — Encounter: Payer: Medicare Other | Admitting: Physical Therapy

## 2018-07-09 ENCOUNTER — Ambulatory Visit: Payer: Medicare Other | Admitting: Physical Therapy

## 2018-07-10 ENCOUNTER — Encounter: Payer: Medicare Other | Admitting: Physical Therapy

## 2018-07-10 ENCOUNTER — Telehealth: Payer: Self-pay | Admitting: Cardiology

## 2018-07-10 NOTE — Telephone Encounter (Signed)
Please bring her to our office for the next available appointment.

## 2018-07-10 NOTE — Telephone Encounter (Signed)
° ° °  Pt c/o Shortness Of Breath: STAT if SOB developed within the last 24 hours or pt is noticeably SOB on the phone  1. Are you currently SOB (can you hear that pt is SOB on the phone)? "a little" per patient  2. How long have you been experiencing SOB? 2-3 days  3. Are you SOB when sitting or when up moving around? Sitting and moving  4. Are you currently experiencing any other symptoms? Palpitations, weakness

## 2018-07-10 NOTE — Telephone Encounter (Signed)
Pt is calling in to let Dr Meda Coffee know that over the last 3 days, she has been experiencing sob, and palpitations with fast HR. Pt states she does not know how fast her HR is.  Pt does not monitor her HR or BP at home.  Pt states she can barely walk down the hall, without being extremely sob.  Pt states that the sob is due to the palpitations.  Pt states that she does not have chest pain, N/V, or diaphoresis with these episodes.  Pt reports she has NO lower extremity swelling.  Pt reports she does feel dizzy with these episodes, and feels pre-syncopal with these episodes as well.  Pt also reports generalized weakness as well. Pt states she has known seizures, and she is unsure if she is about to have another seizure or if this is all cardiac related.  Pt states she is compliant with taking all her meds.  Pt is asking for advice from Dr Meda Coffee on what she should do.  Informed the pt that I will route this message to Dr Meda Coffee to review and advise on, and follow-up with the pt shortly thereafter.  Pt verbalized understanding and agrees with this plan.

## 2018-07-10 NOTE — Telephone Encounter (Signed)
Called the pt and endorsed to her that per Dr Meda Coffee, she recommends that we bring her in to see an Extender, at the very next available appt, for complaints mentioned. Scheduled the pt  the very next available appt, for this Friday 07/12/2018 at 2 pm with Daune Perch NP.  Advised the pt to arrive 15 mins prior to this appt.  Advised the pt that if her symptoms worsen between now and that visit, she should report to the ER for further eval. Pt verbalized understanding and agrees with this plan.

## 2018-07-11 ENCOUNTER — Ambulatory Visit: Payer: Medicare Other | Admitting: Physical Therapy

## 2018-07-12 ENCOUNTER — Ambulatory Visit: Payer: Medicare Other | Admitting: Cardiology

## 2018-07-12 NOTE — Progress Notes (Deleted)
Cardiology Office Note:    Date:  07/12/2018   ID:  [name], DOB [birth year], MRN [id number]  PCP:  Katherine Roan, MD  Cardiologist:  Ena Dawley, MD  Referring MD: Katherine Roan, MD   No chief complaint on file. ***  History of Present Illness:    [name] is a 53 y.o. female with a past medical history significant for  history of chest pain and shortness of breath with prior negative caths,migraines, asthma, seizure disorder with vagal nerve stimulator, s/p wt loss surgery 10/2017, OSA on CPAP and hypertension.She has had chest pain chronically over several years. She was noted to have a reportedly normal cardiac cath in [employer] in 2008.In 2012 she was noted to have ST depression on exercise ECG however Myoview showed no evidence of ischemia and normal EF. Given her ongoing chest pain and abnormal ECG she underwent coronary CT angiogram. It was a difficult study as they were unable to get her heart rate into the ideal range, however, she had no coronary calcium and no definite stenosis. In 2013 in [place] she presented to the ED with exertional chest pain and had a mildly elevated troponin. She underwent cardiac catheterization that showed no angiographic CAD. Echocardiogram showed normal EF.   Right and left heart cath in 05/24/2016 showed normal right and left heart filling pressures, no pulmonary hypertension and normal cardiac output.  I last saw the patient 01/10/2018 at which time she was status post weight loss surgery and had lost 60 pounds so far.  Her home blood pressures have been running high although normal in the office.  I increased her lisinopril.  She is also since that visit been started on diltiazem and Lasix.  [name] called our office 2 days ago with complaints of a 3-day history of shortness of breath and palpitations with fast heart rate.  She was then onto my schedule for further evaluation.      Past Medical History:    Diagnosis Date  . Asthma   . Biliary dyskinesia    a. s/p cholecystectomy.  . 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 Abnl stress [place] 2008, f/u cath reportedly nl. b. ETT-Myoview 04/2011 - EKG changes but normal perfusion. Cor CT - no coronary calcium, no definite stenosis though mRCA not fully evaluated. c. 10/2011 - tn elevated in [place], LHC without CAD. Started on Ranexa, anti-anginals ?microvascular dz but later stopped while in hospital on abx.  . Complication of anesthesia    hard to wake up-had to be reminded to breath  . GERD (gastroesophageal reflux disease)    a. Severe.  Marland Kitchen HTN (hypertension)   . Hx of cardiovascular stress test    Lex Myoview 8/14:  Normal, EF 74%  . Hx of echocardiogram    Echo 3/16:  Mild LVH, EF 55-60%, Gr 1 DD, trivial MR, mild LAE, normal RVF  . Hypothyroid   . MI (myocardial infarction) ([place])   . Migraine   . Morbid obesity ([place]) 07/26/2017  . MRSA infection    a. After vagal nerve stimulator at [place] - surgical site MRSA infection, PICC placed.  . Obesity   . OSA (obstructive sleep apnea) 07/26/2017  . Palpitations    a. 08/2014: 48 hour holter with 2 PVCs otherwise normal.  . Seizure disorder ([place])    a. since childhood. b. s/p vagal nerve stimulator at [facility].  . Seizures [facility])     Past Surgical  History:  Procedure Laterality Date  . [place] N/A 06/01/2016   Procedure: Right Heart Cath;  Surgeon: Larey Dresser, MD;  Location: [place] [facility];  Service: Cardiovascular;  Laterality: N/A;  . CESAREAN SECTION     placement of vagal nerve stimulator.  . CHOLECYSTECTOMY N/A 01/24/2013   Procedure: LAPAROSCOPIC CHOLECYSTECTOMY;  Surgeon: Harl Bowie, MD;  Location: [place];  Service: General;  Laterality: N/A;  . IMPLANTATION VAGAL NERVE STIMULATOR  [phone]   [facility]    Current Medications: No outpatient  medications have been marked as taking for the 07/12/18 encounter (Appointment) with Daune Perch, NP.     Allergies:   Amitriptyline; Amlodipine; Latex; Amantadines; Aspirin; Simvastatin; and Tape   Social History   Socioeconomic History  . Marital status: Single    Spouse name: Not on file  . Number of children: 2  . Years of education: college  . Highest education level: Associate degree: academic program  Occupational History  . Occupation: Disabled  Social Needs  . Financial resource strain: Not on file  . Food insecurity:    Worry: Not on file    Inability: Not on file  . Transportation needs:    Medical: Not on file    Non-medical: Not on file  Tobacco Use  . Smoking status: Never Smoker  . Smokeless tobacco: Never Used  Substance and Sexual Activity  . Alcohol use: Yes    Alcohol/week: 1.0 - 3.0 standard drinks    Types: 1 - 3 Glasses of wine per week    Comment: occasional  . Drug use: No  . Sexual activity: Yes    Birth control/protection: None  Lifestyle  . Physical activity:    Days per week: Not on file    Minutes per session: Not on file  . Stress: Not on file  Relationships  . Social connections:    Talks on phone: Not on file    Gets together: Not on file    Attends religious service: Not on file    Active member of club or organization: Not on file    Attends meetings of clubs or organizations: Not on file    Relationship status: Not on file  Other Topics Concern  . Not on file  Social History Narrative   Lives at home with her daughter.   Right-handed.   No caffeine per day.     Family History: The patient's ***family history includes Cancer in an other family member; Coronary artery disease in her maternal grandmother; Coronary artery disease (age of onset: 85) in her mother; Hypertension in an other family member; Kidney disease in her father; Other in her father; Stroke in an other family member. ROS:   Please see the history of present  illness.    *** All other systems reviewed and are negative.  EKGs/Labs/Other Studies Reviewed:    The following studies were reviewed today: ***  EKG:  EKG is *** ordered today.  The ekg ordered today demonstrates ***  Recent Labs: 08/27/2017: NT-Pro BNP 29 01/01/2018: ALT 34; BUN 6; Creatinine, Ser 0.92; Hemoglobin 11.1; Platelets 196; Potassium 2.7; Sodium 142   Recent Lipid Panel    Component Value Date/Time   CHOL 200 (H) 05/21/2017 1111   TRIG 58 05/21/2017 1111   HDL 59 05/21/2017 1111   CHOLHDL 3.4 05/21/2017 1111   CHOLHDL 3 09/30/2012 0929   VLDL 11.2  09/30/2012 0929   LDLCALC 129 (H) 05/21/2017 1111    Physical Exam:    VS:  There were no vitals taken for this visit.    Wt Readings from Last 3 Encounters:  03/25/18 215 lb (97.5 kg)  03/05/18 222 lb 3.2 oz (100.8 kg)  02/06/18 230 lb (104.3 kg)     Physical Exam***   ASSESSMENT:    No diagnosis found. PLAN:    In order of problems listed above:   Diastolic CHF -Likely related to hypertension  Hypertension -On lisinopril 20 mg, spironolactone 25 mg, carvedilol 25 mg, diltiazem 120 mg daily, Lasix 20 mg daily  Morbid obesity -Had weight loss surgery 10/2017  OSA -On CPAP by Dr. Radford Pax  Seizure disorder   Medication Adjustments/Labs and Tests Ordered: Current medicines are reviewed at length with the patient today.  Concerns regarding medicines are outlined above. Labs and tests ordered and medication changes are outlined in the patient instructions below:  There are no Patient Instructions on file for this visit.   Signed, Daune Perch, NP  07/12/2018 7:24 AM    [facility]

## 2018-07-15 ENCOUNTER — Telehealth: Payer: Self-pay

## 2018-07-15 NOTE — Telephone Encounter (Signed)
The authorization for Echocardiogram Complete has been approved for 07/15/2018-08/13/2018 through [facility].  The authorization number is [id number].

## 2018-07-16 ENCOUNTER — Ambulatory Visit: Payer: Medicare Other | Attending: Internal Medicine | Admitting: Physical Therapy

## 2018-07-16 ENCOUNTER — Encounter: Payer: Self-pay | Admitting: Physical Therapy

## 2018-07-16 DIAGNOSIS — R262 Difficulty in walking, not elsewhere classified: Secondary | ICD-10-CM | POA: Diagnosis not present

## 2018-07-16 DIAGNOSIS — M545 Low back pain, unspecified: Secondary | ICD-10-CM

## 2018-07-16 DIAGNOSIS — G8929 Other chronic pain: Secondary | ICD-10-CM | POA: Diagnosis not present

## 2018-07-16 DIAGNOSIS — M5416 Radiculopathy, lumbar region: Secondary | ICD-10-CM | POA: Diagnosis not present

## 2018-07-16 DIAGNOSIS — M62830 Muscle spasm of back: Secondary | ICD-10-CM | POA: Insufficient documentation

## 2018-07-16 DIAGNOSIS — M6281 Muscle weakness (generalized): Secondary | ICD-10-CM | POA: Diagnosis not present

## 2018-07-16 NOTE — Therapy (Signed)
[facility] [address] [place], [place], [address] Phone: [phone]   Fax:  [phone]  Physical Therapy Treatment / Discharge summary  Patient Details  Name: [name] MRN: [id number] Date of Birth: 1965-10-02 Referring Provider (PT): Beola Cord, MD   Encounter Date: 07/16/2018  PT End of Session - 07/16/18 0819    Visit Number  4    Number of Visits  12    Date for PT Re-Evaluation  07/18/18    PT Start Time  0817   pt arrived 17 min late today   PT Stop Time  0841    PT Time Calculation (min)  24 min    Activity Tolerance  Patient tolerated treatment well    Behavior During Therapy  [facility] for tasks assessed/performed       Past Medical History:  Diagnosis Date  . Asthma   . Biliary dyskinesia    a. s/p cholecystectomy.  . 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 Abnl stress [place] 2008, f/u cath reportedly nl. b. ETT-Myoview 04/2011 - EKG changes but normal perfusion. Cor CT - no coronary calcium, no definite stenosis though mRCA not fully evaluated. c. 10/2011 - tn elevated in [place], LHC without CAD. Started on Ranexa, anti-anginals ?microvascular dz but later stopped while in hospital on abx.  . Complication of anesthesia    hard to wake up-had to be reminded to breath  . GERD (gastroesophageal reflux disease)    a. Severe.  Marland Kitchen HTN (hypertension)   . Hx of cardiovascular stress test    Lex Myoview 8/14:  Normal, EF 74%  . Hx of echocardiogram    Echo 3/16:  Mild LVH, EF 55-60%, Gr 1 DD, trivial MR, mild LAE, normal RVF  . Hypothyroid   . MI (myocardial infarction) ([facility])   . Migraine   . Morbid obesity ([facility]) 07/26/2017  . MRSA infection    a. After vagal nerve stimulator at [facility] - surgical site MRSA infection, PICC placed.  . Obesity   . OSA (obstructive sleep apnea) 07/26/2017  . Palpitations    a. 08/2014: 48 hour holter with 2 PVCs otherwise normal.  . Seizure disorder ([facility])     a. since childhood. b. s/p vagal nerve stimulator at [facility].  . Seizures ([facility])     Past Surgical History:  Procedure Laterality Date  . CARDIAC CATHETERIZATION     [facility]   . CARDIAC CATHETERIZATION N/A 06/01/2016   Procedure: Right Heart Cath;  Surgeon: Laurey Morale, MD;  Location: [facility];  Service: Cardiovascular;  Laterality: N/A;  . CESAREAN SECTION     placement of vagal nerve stimulator.  . CHOLECYSTECTOMY N/A 01/24/2013   Procedure: LAPAROSCOPIC CHOLECYSTECTOMY;  Surgeon: Shelly Rubenstein, MD;  Location: [facility];  Service: General;  Laterality: N/A;  . IMPLANTATION VAGAL NERVE STIMULATOR  [phone]   [facility]    There were no vitals filed for this visit.  Subjective Assessment - 07/16/18 0819    Subjective  " since the last session I am still having good and bad days. It's not getting better that is what I can say. I am still having instabilities and had a near fall at school. I did have a bad seizure last weekend which seems to really effect my mobility and I have to rely on my RW for mobility"    Patient Stated Goals  Strengthen back  Currently in Pain?  Yes    Pain Score  6    fluctuates up to 10/10   Pain Descriptors / Indicators  Nagging    Pain Type  Chronic pain    Pain Onset  More than a month ago    Pain Frequency  Constant    Aggravating Factors   walking/ standing, bending forward, being active    Pain Relieving Factors  exercise (but not a last effect lasts only a few minutes), hot tub          [facility] PT Assessment - 07/16/18 0823      Assessment   Medical Diagnosis  Low back pain with radiculopathy affecting the right lower extremity    Referring Provider (PT)  Beola Cord, MD    Onset Date/Surgical Date  --   unsure     Observation/Other Assessments   Focus on Therapeutic Outcomes (FOTO)   70% limited      AROM   Lumbar Flexion  85   pain during movement   Lumbar Extension   10   pain during movement   Lumbar - Right Side Bend  12   PDM   Lumbar - Left Side Bend  18   PDM     Strength   Overall Strength Comments  BIL hip grossly 4/5                   [facility] Adult PT Treatment/Exercise - 07/16/18 0001      Lumbar Exercises: Supine   Bent Knee Raise  10 reps             PT Education - 07/16/18 0841    Education Details  reviewed previously provided HEP and benefits of consistency. discussed POC with referring back to MD for further assessment.     Person(s) Educated  Patient    Methods  Explanation;Verbal cues    Comprehension  Verbalized understanding;Verbal cues required       PT Short Term Goals - 07/16/18 1914      PT SHORT TERM GOAL #1   Title  Independent with HEP    Time  3    Period  Weeks    Status  Achieved      PT SHORT TERM GOAL #2   Title  Tolerate ambulation at least 10-15 minutes for chores at home, brief community mobility for shopping    Baseline  able to stand 3-5 min with pain getting up to 10/10    Period  Weeks    Status  Not Met      PT SHORT TERM GOAL #3   Title  Centralize right LE pain proximally to at least knee region to improve tolerance for ambulation with decreased leg pain    Baseline  constantly in the legs and progresses to the back, but it does centralize     Period  Weeks    Status  Partially Met        PT Long Term Goals - 07/16/18 0831      PT LONG TERM GOAL #1   Title  Improve FOTO score to 59% or less limitation    Time  6    Period  Weeks    Status  On-going      PT LONG TERM GOAL #2   Title  Tolerate standing/ambulation for periods at least 30 minutes for activities such as grocery shopping with pain <5/10    Baseline  3-5 min standing with pain  increasing to 10/10    Period  Weeks    Status  Not Met      PT LONG TERM GOAL #3   Title  Increase bilat. LE strength grossly 1/2 MMT grade to improve ability for lifting for chores, improve stability for gait/to decrease fall  risk    Baseline  grossly 4/5-see flowsheet    Time  6    Period  Weeks    Status  Not Met      PT LONG TERM GOAL #4   Title  No further need RW for ambulation/independent community ambulation without AD    Baseline  reports intermittent use RW    Time  6    Period  Weeks    Status  Not Met            Plan - 07/16/18 0842    Clinical Impression Statement  Pt demonstrated regression of trunk mobility compared to previous measures and continues to report pain that fluctuates which per pt report can stay up around 10/10. she notes working on ambulating without RW but reports continued potential buckling of the legs with has no precursor or known cause. due to limited progress and continued fluctuation of pain with elevated severity plan to discharge pt from PT and refer back to MD for further assessment.     PT Next Visit Plan  D/C    PT Home Exercise Plan  pelvic tilts, SKTC, abd. bracing with marches, hip bridge, clamshell, right lateral hip/IT band stretch,  Hamstring stretch    Consulted and Agree with Plan of Care  Patient       Patient will benefit from skilled therapeutic intervention in order to improve the following deficits and impairments:     Visit Diagnosis: Radiculopathy, lumbar region  Muscle weakness (generalized)  Difficulty in walking, not elsewhere classified  Chronic bilateral low back pain without sciatica  Muscle spasm of back     Problem List Patient Active Problem List   Diagnosis Date Noted  . Depression 05/14/2018  . Syncope 05/13/2018  . Lumbar radiculopathy 03/26/2018  . Lumbar back pain with radiculopathy affecting right lower extremity 03/05/2018  . Right sided weakness 01/07/2018  . Muscle weakness (generalized) 01/07/2018  . Rectal pain 12/19/2017  . Flu-like symptoms 08/09/2017  . Diarrhea 08/09/2017  . OSA (obstructive sleep apnea) 07/26/2017  . Morbid obesity (HCC) 07/26/2017  . Visit for suture removal 04/20/2017  . Ear pain,  bilateral 03/05/2017  . Arm pain, diffuse, right 01/19/2017  . Hydradenitis 11/09/2016  . Irritant contact dermatitis due to other agents 11/09/2016  . Takotsubo cardiomyopathy 10/26/2016  . Low back pain 04/19/2016  . Abnormal weight gain 06/11/2015  . BMI 40.0-44.9, adult (HCC) 06/11/2015  . Asthma 08/24/2014  . Chronic diastolic heart failure (HCC) 06/19/2014  . Allergic rhinitis 05/04/2014  . Pharyngoesophageal dysphagia 01/30/2014  . Hypothyroidism 07/02/2013  . Chronic chest pain   . GERD (gastroesophageal reflux disease) 02/10/2013  . Biliary dyskinesia 12/30/2012  . Status post VNS (vagus nerve stimulator) placement 06/10/2012  . Refusal of blood transfusions as patient is Jehovah's Witness 05/20/2012  . Dyslipidemia 12/29/2011  . 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 Dyslipidemia, goal LDL below 70 05/17/2011  . Non-cardiac chest pain secondary to GERD 04/24/2011  . HTN, goal below 130/80 04/24/2011    Lulu Riding 07/16/2018, 8:52 AM  [facility] [address] [place], [place], [address] Phone: [phone]  Fax:  [phone]  Name: [name] MRN: [id number] Date of Birth: 1966-05-16       PHYSICAL THERAPY DISCHARGE SUMMARY  Visits from Start of Care: 4  Current functional level related to goals / functional outcomes: See goals, 70% FOTO limitation   Remaining deficits: See assessment in note   Education / Equipment: HEP  Plan: Patient agrees to discharge.  Patient goals were not met. Patient is being discharged due to lack of progress.  ?????         Bayley Hurn PT, [name], LAT, ATC  07/16/18  8:52 AM

## 2018-07-17 ENCOUNTER — Ambulatory Visit (HOSPITAL_COMMUNITY)
Admission: RE | Admit: 2018-07-17 | Discharge: 2018-07-17 | Disposition: A | Discharge status: Home / Self Care | Payer: Medicare Other | Source: Ambulatory Visit | Attending: Internal Medicine | Admitting: Internal Medicine

## 2018-07-17 ENCOUNTER — Encounter (HOSPITAL_COMMUNITY): Payer: Self-pay | Admitting: Internal Medicine

## 2018-07-17 DIAGNOSIS — G4733 Obstructive sleep apnea (adult) (pediatric): Secondary | ICD-10-CM | POA: Diagnosis not present

## 2018-07-17 DIAGNOSIS — I10 Essential (primary) hypertension: Secondary | ICD-10-CM | POA: Insufficient documentation

## 2018-07-17 DIAGNOSIS — I252 Old myocardial infarction: Secondary | ICD-10-CM | POA: Diagnosis not present

## 2018-07-17 DIAGNOSIS — R569 Unspecified convulsions: Secondary | ICD-10-CM | POA: Diagnosis not present

## 2018-07-17 DIAGNOSIS — E039 Hypothyroidism, unspecified: Secondary | ICD-10-CM | POA: Insufficient documentation

## 2018-07-17 DIAGNOSIS — R55 Syncope and collapse: Secondary | ICD-10-CM | POA: Insufficient documentation

## 2018-07-17 DIAGNOSIS — K219 Gastro-esophageal reflux disease without esophagitis: Secondary | ICD-10-CM | POA: Insufficient documentation

## 2018-07-17 NOTE — Progress Notes (Signed)
  Echocardiogram 2D Echocardiogram has been performed.  Angelea Penny G Gaynor Genco 07/17/2018, 10:04 AM

## 2018-07-19 ENCOUNTER — Ambulatory Visit: Payer: Medicare Other | Admitting: Physical Therapy

## 2018-07-22 ENCOUNTER — Ambulatory Visit (INDEPENDENT_AMBULATORY_CARE_PROVIDER_SITE_OTHER): Payer: Medicare Other | Admitting: Cardiology

## 2018-07-22 ENCOUNTER — Encounter: Payer: Self-pay | Admitting: Cardiology

## 2018-07-22 VITALS — BP 124/80 | HR 79 | Ht 65.5 in | Wt 191.0 lb

## 2018-07-22 DIAGNOSIS — R0789 Other chest pain: Secondary | ICD-10-CM

## 2018-07-22 DIAGNOSIS — I10 Essential (primary) hypertension: Secondary | ICD-10-CM | POA: Diagnosis not present

## 2018-07-22 DIAGNOSIS — I5032 Chronic diastolic (congestive) heart failure: Secondary | ICD-10-CM | POA: Diagnosis not present

## 2018-07-22 DIAGNOSIS — R0602 Shortness of breath: Secondary | ICD-10-CM

## 2018-07-22 DIAGNOSIS — G4733 Obstructive sleep apnea (adult) (pediatric): Secondary | ICD-10-CM

## 2018-07-22 DIAGNOSIS — R002 Palpitations: Secondary | ICD-10-CM | POA: Diagnosis not present

## 2018-07-22 DIAGNOSIS — Z6831 Body mass index (BMI) 31.0-31.9, adult: Secondary | ICD-10-CM

## 2018-07-22 DIAGNOSIS — E6609 Other obesity due to excess calories: Secondary | ICD-10-CM

## 2018-07-22 LAB — BASIC METABOLIC PANEL
BUN/Creatinine Ratio: 16 (ref 9–23)
BUN: 12 mg/dL (ref 6–24)
CO2: 21 mmol/L (ref 20–29)
Calcium: 9.6 mg/dL (ref 8.7–10.2)
Chloride: 108 mmol/L — ABNORMAL HIGH (ref 96–106)
Creatinine, Ser: 0.76 mg/dL (ref 0.57–1.00)
GFR calc Af Amer: 103 mL/min/{1.73_m2} (ref >59)
GFR calc non Af Amer: 89 mL/min/{1.73_m2} (ref >59)
Glucose: 89 mg/dL (ref 65–99)
Potassium: 3.5 mmol/L (ref 3.5–5.2)
Sodium: 142 mmol/L (ref 134–144)

## 2018-07-22 LAB — PRO B NATRIURETIC PEPTIDE: NT-Pro BNP: 55 pg/mL (ref 0–249)

## 2018-07-22 MED ORDER — FUROSEMIDE 20 MG PO TABS: 20.0000 mg | ORAL_TABLET | Freq: Every day | ORAL | 3 refills | Status: DC

## 2018-07-22 MED ORDER — SPIRONOLACTONE 25 MG PO TABS: 25.0000 mg | ORAL_TABLET | Freq: Every day | ORAL | 3 refills | Status: DC

## 2018-07-22 NOTE — Patient Instructions (Addendum)
Medication Instructions:  INCREASE: Lasix to 40 mg for 3 days then resume 20 mg daily   RESTART: Spironolactone 25 mg daily  If you need a refill on your cardiac medications before your next appointment, please call your pharmacy.   Lab work: TODAY: BMET & pro BNP    If you have labs (blood work) drawn today and your tests are completely normal, you will receive your results only by: Marland Kitchen MyChart Message (if you have MyChart) OR . A paper copy in the mail If you have any lab test that is abnormal or we need to change your treatment, we will call you to review the results.  Testing/Procedures: Your physician has requested that you have a lexiscan myoview. For further information please visit [email or web address]. Please follow instruction sheet, as given.   Follow-Up: At [facility], you and your health needs are our priority.  As part of our continuing mission to provide you with exceptional heart care, we have created designated Provider Care Teams.  These Care Teams include your primary Cardiologist (physician) and Advanced Practice Providers (APPs -  Physician Assistants and Nurse Practitioners) who all work together to provide you with the care you need, when you need it. You will need a follow up appointment at the next available. You may see Ena Dawley, MD or one of the following Advanced Practice Providers on your designated Care Team:   [place], PA-C Melina Copa, PA-C . Ermalinda Barrios, PA-C  Any Other Special Instructions Will Be Listed Below (If Applicable).

## 2018-07-22 NOTE — Progress Notes (Signed)
Cardiology Office Note:    Date:  07/22/2018   ID:  [name], DOB 1966-05-24, MRN [id number]  PCP:  Katherine Roan, MD  Cardiologist:  Ena Dawley, MD  Referring MD: Katherine Roan, MD   Chief Complaint  Patient presents with  . Palpitations    History of Present Illness:    [name] is a 53 y.o. female with a past medical history significant for  history of chest pain and shortness of breath with prior negative caths,migraines, asthma, seizure disorder with vagal nerve stimulator, hypertension and wt loss surgery 10/2017.She has had chest pain chronically over several years. She was noted to have a reportedly normal cardiac cath in [place] in 2008.In 2012 she was noted to have ST depression on exercise ECG however Myoview showed no evidence of ischemia and normal EF. Given her ongoing chest pain and abnormal ECG she underwent coronary CT angiogram. It was a difficult study as they were unable to get her heart rate into the ideal range, however, she had no coronary calcium and no definite stenosis. In 2013 in [place] she presented to the ED with exertional chest pain and had a mildly elevated troponin. She underwent cardiac catheterization that showed no angiographic CAD. Echocardiogram showed normal EF.   Right and left heart cath in 05/24/2016 showed normal right and left heart filling pressures, no pulmonary hypertension and normal cardiac output.  At last visit with me in 01/2018 she had lost 60 lbs after wt loss surgery. Her BP was elevated per home readings and lisinopril was increased.   She is here today for c/o palpitations and she has passed out 3 times. She wore a cardiac monitor for a month that showed Sinus rhythm to sinus tachycardia with infrequent PVCs and no arrhythmias or pauses. Echo done on 07/17/2018 showed no changes from previous.   Today she says that she feels her heart occasionally racing and occ skipping beats, not related to  activity. She was having these symptoms while wearing the monitor but no significant arrhythmia was found. She feels that her symptoms have been worse over the last 2 weeks.   In December she was at the bank and felt queazy, flushed and nauseous. She put her head on the counter to get herself together and then she woke up on the floor.   Since her wt loss surgery she is now able to eat regular foods, drinks protein shakes and takes vitamins.   She was started on diltiazem after the monitor. With her recent increase in symptoms she stopped taking it but symptoms did not improve.   She had chest pain last week and took a SL NTG. She was active, had been walking around [facility] but she has had chest pain in the past at rest. She describes it as pressure. It lasted a few minutes and resolved with rest and NTG. She also has DOE. Last week she tried to take 5 stairs, which she had not been doing, and she had doe and chest pressure.   She says that her body has changed a lot since her wt loss of over 100 lbs and she is not sure what she can do. She does water aerobics twice a week and she feels like this is helping her breathing. She does this with her daughter who is autistic.   She has a history of chest pain with reassuring testing but she feels like the chest pressure is worse over the last few weeks  and this concerns her.   Pt sleeps on 4 pillows, occ edema, none today.   She admits that she has been our spironolactone for at least the last month.   Past Medical History:  Diagnosis Date  . Asthma   . Biliary dyskinesia    a. s/p cholecystectomy.  . 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 Abnl stress [place] 2008, f/u cath reportedly nl. b. ETT-Myoview 04/2011 - EKG changes but normal perfusion. Cor CT - no coronary calcium, no definite stenosis though mRCA not fully evaluated. c. 10/2011 - tn elevated in [place], LHC without CAD. Started on Ranexa, anti-anginals ?microvascular dz  but later stopped while in hospital on abx.  . Complication of anesthesia    hard to wake up-had to be reminded to breath  . GERD (gastroesophageal reflux disease)    a. Severe.  Marland Kitchen HTN (hypertension)   . Hx of cardiovascular stress test    Lex Myoview 8/14:  Normal, EF 74%  . Hx of echocardiogram    Echo 3/16:  Mild LVH, EF 55-60%, Gr 1 DD, trivial MR, mild LAE, normal RVF  . Hypothyroid   . MI (myocardial infarction) ([place])   . Migraine   . Morbid obesity ([place]) 07/26/2017  . MRSA infection    a. After vagal nerve stimulator at [facility] - surgical site MRSA infection, PICC placed.  . Obesity   . OSA (obstructive sleep apnea) 07/26/2017  . Palpitations    a. 08/2014: 48 hour holter with 2 PVCs otherwise normal.  . Seizure disorder ([place])    a. since childhood. b. s/p vagal nerve stimulator at [facility].  . Seizures ([place])     Past Surgical History:  Procedure Laterality Date  . [place] N/A 06/01/2016   Procedure: Right Heart Cath;  Surgeon: Larey Dresser, MD;  Location: [place] [facility];  Service: Cardiovascular;  Laterality: N/A;  . CESAREAN SECTION     placement of vagal nerve stimulator.  . CHOLECYSTECTOMY N/A 01/24/2013   Procedure: LAPAROSCOPIC CHOLECYSTECTOMY;  Surgeon: Harl Bowie, MD;  Location: [place];  Service: General;  Laterality: N/A;  . IMPLANTATION VAGAL NERVE STIMULATOR  [phone]   battery [facility]    Current Medications: Current Meds  Medication Sig  . acetaminophen (TYLENOL) 325 MG tablet Take 975 mg by mouth every 6 (six) hours as needed for headache.  . albuterol (PROVENTIL HFA;VENTOLIN HFA) 108 (90 Base) MCG/ACT inhaler Inhale 2 puffs into the lungs every 6 (six) hours as needed for wheezing or shortness of breath.  . carvedilol (COREG) 25 MG tablet Take 25 mg by mouth 2 (two) times daily.  . cyclobenzaprine (FLEXERIL) 5 MG tablet Take 1 tablet (5 mg  total) by mouth 2 (two) times daily.  Marland Kitchen diltiazem (CARDIZEM CD) 120 MG 24 hr capsule Take 1 capsule (120 mg total) by mouth daily.  . fluticasone (FLONASE) 50 MCG/ACT nasal spray Place 1 spray into both nostrils 2 (two) times daily as needed for allergies.   . furosemide (LASIX) 20 MG tablet Take 1 tablet (20 mg total) by mouth daily.  Marland Kitchen gabapentin (NEURONTIN) 300 MG capsule Take 1 capsule (300 mg total) by mouth at bedtime.  . lamoTRIgine (LAMICTAL) 100 MG tablet Take 1 tablet (100 mg total) by mouth 2 (two) times daily.  Marland Kitchen levothyroxine (SYNTHROID, LEVOTHROID) 50 MCG tablet Take 50 mcg by mouth daily.   Marland Kitchen  lidocaine (XYLOCAINE) 2 % jelly Apply 1 application topically as needed.  Marland Kitchen lisinopril (PRINIVIL,ZESTRIL) 20 MG tablet Take 1 tablet (20 mg total) by mouth daily.  Marland Kitchen loperamide (IMODIUM) 2 MG capsule Take 1 capsule (2 mg total) by mouth as needed for diarrhea or loose stools.  . mometasone-formoterol (DULERA) 100-5 MCG/ACT AERO Inhale 2 puffs into the lungs 2 (two) times daily.  . nitroGLYCERIN (NITRODUR - DOSED IN MG/24 HR) 0.2 mg/hr patch PLACE 1 PATCH ONTO THE SKIN DAILY.  . nitroGLYCERIN (NITROSTAT) 0.4 MG SL tablet PLACE 1 TAB UNDER TONGUE EVERY 5 MINUTES AS NEEDED FOR CHEST PAIN, MAX 3/15 MINS  . pantoprazole (PROTONIX) 40 MG tablet Take 1 tablet (40 mg total) by mouth 2 (two) times daily.  . promethazine (PHENERGAN) 25 MG tablet Take 25 mg by mouth every 8 (eight) hours as needed for nausea/vomiting.  . topiramate (TOPAMAX) 200 MG tablet Take 1 tablet (200 mg total) by mouth 2 (two) times daily.  . [DISCONTINUED] furosemide (LASIX) 20 MG tablet Take 1 tablet (20 mg total) by mouth daily.     Allergies:   Amitriptyline; Amlodipine; Latex; Amantadines; Aspirin; Simvastatin; and Tape   Social History   Socioeconomic History  . Marital status: Single    Spouse name: Not on file  . Number of children: 2  . Years of education: college  . Highest education level: Associate degree:  academic program  Occupational History  . Occupation: Disabled  Social Needs  . Financial resource strain: Not on file  . Food insecurity:    Worry: Not on file    Inability: Not on file  . Transportation needs:    Medical: Not on file    Non-medical: Not on file  Tobacco Use  . Smoking status: Never Smoker  . Smokeless tobacco: Never Used  Substance and Sexual Activity  . Alcohol use: Yes    Alcohol/week: 1.0 - 3.0 standard drinks    Types: 1 - 3 Glasses of wine per week    Comment: occasional  . Drug use: No  . Sexual activity: Yes    Birth control/protection: None  Lifestyle  . Physical activity:    Days per week: Not on file    Minutes per session: Not on file  . Stress: Not on file  Relationships  . Social connections:    Talks on phone: Not on file    Gets together: Not on file    Attends religious service: Not on file    Active member of club or organization: Not on file    Attends meetings of clubs or organizations: Not on file    Relationship status: Not on file  Other Topics Concern  . Not on file  Social History Narrative   Lives at home with her daughter.   Right-handed.   No caffeine per day.     Family History: The patient's family history includes Cancer in an other family member; Coronary artery disease in her maternal grandmother; Coronary artery disease (age of onset: 68) in her mother; Hypertension in an other family member; Kidney disease in her father; Other in her father; Stroke in an other family member. ROS:   Please see the history of present illness.     All other systems reviewed and are negative.  EKGs/Labs/Other Studies Reviewed:    The following studies were reviewed today:  Echocardiogram 07/17/2018 IMPRESSIONS   1. The left ventricle has low normal systolic function, with an ejection fraction of 50-55%. The cavity size  was mildly dilated. Left ventricular diastolic Doppler parameters are consistent with impaired relaxation.  2.  The right ventricle has normal systolic function. The cavity was normal. There is no increase in right ventricular wall thickness.  3. The mitral valve is normal in structure.  4. The tricuspid valve is normal in structure.  5. The aortic valve is normal in structure.  6. The pulmonic valve was normal in structure.  7. Right atrial pressure is estimated at 3 mmHg.  FINDINGS  Left Ventricle: The left ventricle has low normal systolic function, with an ejection fraction of 50-55%. The cavity size was mildly dilated. There is no increase in left ventricular wall thickness. Left ventricular diastolic Doppler parameters are  consistent with impaired relaxation Right Ventricle: The right ventricle has normal systolic function. The cavity was normal. There is no increase in right ventricular wall thickness. Left Atrium: left atrial size was normal in size Right Atrium: right atrial size was normal in size Right atrial pressure is estimated at 3 mmHg. Interatrial Septum: No atrial level shunt detected by color flow Doppler. Pericardium: There is no evidence of pericardial effusion. Mitral Valve: The mitral valve is normal in structure. Mitral valve regurgitation is trivial by color flow Doppler. Tricuspid Valve: The tricuspid valve is normal in structure. Tricuspid valve regurgitation was not visualized by color flow Doppler. Aortic Valve: The aortic valve is normal in structure. Aortic valve regurgitation was not visualized by color flow Doppler. Pulmonic Valve: The pulmonic valve was normal in structure. Pulmonic valve regurgitation is trivial by color flow Doppler. Venous: The inferior vena cava is normal in size with greater than 50% respiratory variability.    EKG:  EKG is ordered today.  The ekg ordered today demonstrates normal sinus rhythm, 79 bpm abnormal R wave progressi, nonspecific T changes inferiorly and laterally, similar to prior tracings  Recent Labs: 01/01/2018: ALT 34; Hemoglobin  11.1; Platelets 196 07/22/2018: BUN 12; Creatinine, Ser 0.76; NT-Pro BNP 55; Potassium 3.5; Sodium 142   Recent Lipid Panel    Component Value Date/Time   CHOL 200 (H) 05/21/2017 1111   TRIG 58 05/21/2017 1111   HDL 59 05/21/2017 1111   CHOLHDL 3.4 05/21/2017 1111   CHOLHDL 3 09/30/2012 0929   VLDL 11.2 09/30/2012 0929   LDLCALC 129 (H) 05/21/2017 1111    Physical Exam:    VS:  BP 124/80   Pulse 79   Ht 5' 5.5" (1.664 m)   Wt 191 lb (86.6 kg)   BMI 31.30 kg/m     Wt Readings from Last 3 Encounters:  07/22/18 191 lb (86.6 kg)  03/25/18 215 lb (97.5 kg)  03/05/18 222 lb 3.2 oz (100.8 kg)     Physical Exam  Constitutional: She is oriented to person, place, and time. She appears well-developed and well-nourished. No distress.  HENT:  Head: Normocephalic and atraumatic.  Neck: Normal range of motion. Neck supple. No JVD present.  Cardiovascular: Normal rate, regular rhythm, normal heart sounds and intact distal pulses. Exam reveals no gallop and no friction rub.  No murmur heard. Pulmonary/Chest: Effort normal and breath sounds normal. No respiratory distress. She has no wheezes. She has no rales.  Abdominal: Soft. Bowel sounds are normal.  Musculoskeletal: Normal range of motion.        General: No edema.  Neurological: She is alert and oriented to person, place, and time.  Skin: Skin is warm and dry.  Psychiatric: She has a normal mood and affect. Her behavior is normal.  Judgment and thought content normal.  Vitals reviewed.  ASSESSMENT:    1. Palpitations   2. Chest pressure   3. SOB (shortness of breath)   4. Chronic diastolic (congestive) heart failure ([place])   5. HTN, goal below 130/80   6. Class 1 obesity due to excess calories with body mass index (BMI) of 31.0 to 31.9 in adult, unspecified whether serious comorbidity present   7. OSA (obstructive sleep apnea)    PLAN:    In order of problems listed above:  Palpitations -Pt has c/o occ fast heart beats.  She reports syncope X3. She tells me about one of those episodes when she passed out at a bank while standing. She does not recall any dizziness, chest pain or shortness of breath, only weakness.  -Echo ordered by her PCP was unchanged.  -A 30 day monitor showed SR, ST and infrequent PVCs. There were no arrhythmias associated with her reports of dizziness and palpitations.   Chest pressure/DOE -Symptoms may be related to her being out of spironolactone for at least the last month.  -Will check BMet and BNP. Resume spiro and have her take extra lasix for 3 days.  -She is concerned about her symptoms even though cath in 2017 showed no CAD. With recent significant wt loss and changes to her body will check lexiscan. Unable to do stress echo due to baseline abnormal EKG. If normal, would not pursue any further testing at this time.   Chronic Diastolic CHF -C/O orthopnea. Takes lasix 20 mg daily. Will have her take 40 mg X 3 days.  -Has been out of spironolactone for at least a month or more. Resume.   Hypertension -BP well controlled. Pt plans to get BP cuff. Will have her monitor BP with resumption of spiro.   Morbid obesity -S/P wt loss surgery 10/2017, has lost >100 lbs.  -Body mass index is 31.3 kg/m. much improved. -She reports now back to eating fairly normally.   OSA -On CPAP per Dr. Radford Pax. Using CPAP every night and she is benefiting.    Medication Adjustments/Labs and Tests Ordered: Current medicines are reviewed at length with the patient today.  Concerns regarding medicines are outlined above. Labs and tests ordered and medication changes are outlined in the patient instructions below:  Patient Instructions  Medication Instructions:  INCREASE: Lasix to 40 mg for 3 days then resume 20 mg daily   RESTART: Spironolactone 25 mg daily  If you need a refill on your cardiac medications before your next appointment, please call your pharmacy.   Lab work: TODAY: BMET & pro BNP      If you have labs (blood work) drawn today and your tests are completely normal, you will receive your results only by: Marland Kitchen MyChart Message (if you have MyChart) OR . A paper copy in the mail If you have any lab test that is abnormal or we need to change your treatment, we will call you to review the results.  Testing/Procedures: Your physician has requested that you have a lexiscan myoview. For further information please visit [email or web address]. Please follow instruction sheet, as given.   Follow-Up: At [facility], you and your health needs are our priority.  As part of our continuing mission to provide you with exceptional heart care, we have created designated Provider Care Teams.  These Care Teams include your primary Cardiologist (physician) and Advanced Practice Providers (APPs -  Physician Assistants and Nurse Practitioners) who all work together to provide you with the  care you need, when you need it. You will need a follow up appointment at the next available. You may see Ena Dawley, MD or one of the following Advanced Practice Providers on your designated Care Team:   [place], PA-C Melina Copa, PA-C . Ermalinda Barrios, PA-C  Any Other Special Instructions Will Be Listed Below (If Applicable).       Signed, Daune Perch, NP  07/22/2018 4:54 PM    [place]

## 2018-07-24 ENCOUNTER — Telehealth: Payer: Self-pay

## 2018-07-24 NOTE — Telephone Encounter (Signed)
Requesting test result. Please call back.

## 2018-07-25 NOTE — Telephone Encounter (Signed)
I have not ordered any tests

## 2018-07-25 NOTE — Telephone Encounter (Signed)
Pt stated she did not need test results but she did have an Echo done per her cardiologist. But requesting to schedule an appt w/her PCP. [place] office has closed due to the weather. I will f/u tomorrow afternoon.

## 2018-07-26 NOTE — Telephone Encounter (Signed)
Appt has been scheduled with Dr Shan Levans on 3/30.

## 2018-07-30 ENCOUNTER — Telehealth (HOSPITAL_COMMUNITY): Payer: Self-pay | Admitting: *Deleted

## 2018-07-30 NOTE — Telephone Encounter (Signed)
Left message on voicemail per DPR in reference to upcoming appointment scheduled on 08/02/18 at 1000 with detailed instructions given per Myocardial Perfusion Study Information Sheet for the test. LM to arrive 15 minutes early, and that it is imperative to arrive on time for appointment to keep from having the test rescheduled. If you need to cancel or reschedule your appointment, please call the office within 24 hours of your appointment. Failure to do so may result in a cancellation of your appointment, and a $50 no show fee. Phone number given for call back for any questions.  Ansley Mangiapane, Lucius Conn letter sent

## 2018-08-02 ENCOUNTER — Ambulatory Visit (HOSPITAL_COMMUNITY): Payer: Medicare Other | Attending: Cardiovascular Disease

## 2018-08-02 DIAGNOSIS — R0602 Shortness of breath: Secondary | ICD-10-CM

## 2018-08-02 DIAGNOSIS — R0789 Other chest pain: Secondary | ICD-10-CM | POA: Diagnosis not present

## 2018-08-02 LAB — MYOCARDIAL PERFUSION IMAGING
LV dias vol: 104 mL (ref 46–106)
LV sys vol: 52 mL
Peak HR: 97 {beats}/min
Rest HR: 64 {beats}/min
SDS: 3
SRS: 0
SSS: 3
TID: 1.05

## 2018-08-02 MED ORDER — ADENOSINE (DIAGNOSTIC) 3 MG/ML IV SOLN
0.5600 mg/kg | Freq: Once | INTRAVENOUS | Status: AC
  Administered 2018-08-02: 48.6 mg via INTRAVENOUS

## 2018-08-02 MED ORDER — TECHNETIUM TC 99M TETROFOSMIN IV KIT
30.2000 | PACK | Freq: Once | INTRAVENOUS | Status: AC | PRN
  Administered 2018-08-02: 30.2 via INTRAVENOUS
  Filled 2018-08-02: qty 31

## 2018-08-02 MED ORDER — TECHNETIUM TC 99M TETROFOSMIN IV KIT
10.4000 | PACK | Freq: Once | INTRAVENOUS | Status: AC | PRN
  Administered 2018-08-02: 10.4 via INTRAVENOUS
  Filled 2018-08-02: qty 11

## 2018-08-06 DIAGNOSIS — Z79899 Other long term (current) drug therapy: Secondary | ICD-10-CM | POA: Diagnosis not present

## 2018-08-06 DIAGNOSIS — Z6841 Body Mass Index (BMI) 40.0 and over, adult: Secondary | ICD-10-CM | POA: Diagnosis not present

## 2018-08-06 DIAGNOSIS — Z9884 Bariatric surgery status: Secondary | ICD-10-CM | POA: Diagnosis not present

## 2018-08-13 ENCOUNTER — Encounter: Payer: Self-pay | Admitting: Internal Medicine

## 2018-08-13 ENCOUNTER — Ambulatory Visit (INDEPENDENT_AMBULATORY_CARE_PROVIDER_SITE_OTHER): Payer: Medicare Other | Admitting: Internal Medicine

## 2018-08-13 ENCOUNTER — Other Ambulatory Visit: Payer: Self-pay

## 2018-08-13 ENCOUNTER — Encounter: Payer: Self-pay | Admitting: Licensed Clinical Social Worker

## 2018-08-13 VITALS — BP 142/89 | HR 79 | Temp 98.1°F | Ht 65.5 in | Wt 189.2 lb

## 2018-08-13 DIAGNOSIS — M25562 Pain in left knee: Secondary | ICD-10-CM

## 2018-08-13 DIAGNOSIS — I110 Hypertensive heart disease with heart failure: Secondary | ICD-10-CM

## 2018-08-13 DIAGNOSIS — M545 Low back pain: Secondary | ICD-10-CM | POA: Diagnosis not present

## 2018-08-13 DIAGNOSIS — I5032 Chronic diastolic (congestive) heart failure: Secondary | ICD-10-CM

## 2018-08-13 DIAGNOSIS — G4733 Obstructive sleep apnea (adult) (pediatric): Secondary | ICD-10-CM

## 2018-08-13 NOTE — Addendum Note (Signed)
Addended by: Oval Linsey D on: 08/13/2018 08:07 PM   Modules accepted: Level of Service

## 2018-08-13 NOTE — Progress Notes (Signed)
   CC: Left Knee Pain  HPI:  [name] is a 53 y.o. female with hypertension, chronic diastolic heart failure, OSA who presents for knee pain. Please see problem based charting for evaluation, assessment, and plan.  Past Medical History:  Diagnosis Date  . Asthma   . Biliary dyskinesia    a. s/p cholecystectomy.  . 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 Abnl stress [place] 2008, f/u cath reportedly nl. b. ETT-Myoview 04/2011 - EKG changes but normal perfusion. Cor CT - no coronary calcium, no definite stenosis though mRCA not fully evaluated. c. 10/2011 - tn elevated in [place], LHC without CAD. Started on Ranexa, anti-anginals ?microvascular dz but later stopped while in hospital on abx.  . Complication of anesthesia    hard to wake up-had to be reminded to breath  . GERD (gastroesophageal reflux disease)    a. Severe.  Marland Kitchen HTN (hypertension)   . Hx of cardiovascular stress test    Lex Myoview 8/14:  Normal, EF 74%  . Hx of echocardiogram    Echo 3/16:  Mild LVH, EF 55-60%, Gr 1 DD, trivial MR, mild LAE, normal RVF  . Hypothyroid   . MI (myocardial infarction) ([place])   . Migraine   . Morbid obesity ([place]) 07/26/2017  . MRSA infection    a. After vagal nerve stimulator at [facility] - surgical site MRSA infection, PICC placed.  . Obesity   . OSA (obstructive sleep apnea) 07/26/2017  . Palpitations    a. 08/2014: 48 hour holter with 2 PVCs otherwise normal.  . Seizure disorder ([place])    a. since childhood. b. s/p vagal nerve stimulator at [facility].  . Seizures ([place])    Review of Systems:    Review of Systems  Constitutional: Negative for malaise/fatigue.  Respiratory: Negative for cough.   Cardiovascular: Negative for chest pain.  Gastrointestinal: Negative for abdominal pain, nausea and vomiting.  Musculoskeletal: Positive for back pain and joint pain. Negative for falls.  Neurological: Negative for dizziness and headaches.   Physical Exam:  Vitals:   08/13/18 1010  BP: (!) 142/89  Pulse: 79  Temp: 98.1 F (36.7 C)  TempSrc: Oral  SpO2: 100%  Weight: 189 lb 3.2 oz (85.8 kg)  Height: 5' 5.5" (1.664 m)   Physical Exam  Constitutional: She appears well-developed and well-nourished. No distress.  HENT:  Head: Normocephalic and atraumatic.  Eyes: Conjunctivae are normal.  Cardiovascular: Normal rate and normal heart sounds.  Respiratory: Effort normal and breath sounds normal. No respiratory distress. She has no wheezes.  GI: Soft. Bowel sounds are normal. She exhibits no distension. There is no abdominal tenderness.  Musculoskeletal:     Comments: Left lower extremity 3/5 strength, right lower extremity 4/5 strength. Positive thessaly test.   Left knee with prepatellar swelling, nonerythematous, mild amount of effusion appreciated on palpation, tenderness to palpation of prepatellar and inferior patellar region  Skin: She is not diaphoretic.  Psychiatric: She has a normal mood and affect. Her behavior is normal. Judgment and thought content normal.   Assessment & Plan:   See Encounters Tab for problem based charting.  Patient discussed with Dr. Eppie Gibson
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
## 2018-08-13 NOTE — Progress Notes (Signed)
Patient ID: [name], female   DOB: [birth year], 53 y.o.   MRN: [id number]  Case discussed with Dr. Maricela Bo at the time of the visit.  We reviewed the resident's history and exam and pertinent patient test results.  I agree with the assessment, diagnosis and plan of care documented in the resident's note.

## 2018-08-13 NOTE — Patient Instructions (Addendum)
It was a pleasure to see you today [name]. I am sorry to hear about your left knee pain. 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 If you feel you are having a medical emergency please call 911.   Thank you, we look forward to help you remain healthy!  Lars Mage, MD Internal Medicine PGY2   Patellofemoral Pain Syndrome  Patellofemoral pain syndrome is a condition in which the tissue (cartilage) on the underside of the kneecap (patella) softens or breaks down. This causes pain in the front of the knee. The condition is also called runner's knee or chondromalacia patella. Patellofemoral pain syndrome is most common in young adults who are active in sports. The knee is the largest joint in the body. The patella covers the front of the knee and is attached to muscles above and below the knee. The underside of the patella is covered with a smooth type of cartilage (synovium). The smooth surface helps the patella to glide easily when you move your knee. Patellofemoral pain syndrome causes swelling in the joint linings and bone surfaces in the knee. What are the causes? This condition may be caused by:  Overuse of the knee.  Poor alignment of your knee joints.  Weak leg muscles.  A direct blow to your kneecap. What increases the risk? You are more likely to develop this condition if:  You do a lot of activities that can wear down your kneecap. These  include: ? Running. ? Squatting. ? Climbing stairs.  You start a new physical activity or exercise program.  You wear shoes that do not fit well.  You do not have good leg strength.  You are overweight. What are the signs or symptoms? The main symptom of this condition is knee pain. This may feel like a dull, aching pain underneath your patella, in the front of your knee. There may be a popping or cracking sound when you move your knee. Pain may get worse with:  Exercise.  Climbing stairs.  Running.  Jumping.  Squatting.  Kneeling.  Sitting for a long time.  Moving or pushing on your patella. How is this diagnosed? This condition may be diagnosed based on:  Your symptoms and medical history. You may be asked about your recent physical activities and which ones cause knee pain.  A physical exam. This may include: ? Moving your patella back and forth. ? Checking your range of knee motion. ? Having you squat or jump to see if you have pain. ? Checking the strength of your leg muscles.  Imaging tests to confirm the diagnosis. These may include an MRI of your knee. How is this treated? This condition may be treated at home with rest, ice, compression, and elevation (RICE).  Other treatments may include:  Nonsteroidal anti-inflammatory drugs (NSAIDs).  Physical therapy to stretch and strengthen your leg muscles.  Shoe inserts (orthotics) to take stress off your knee.  A knee brace or knee  support.  Adhesive tapes to the skin.  Surgery to remove damaged cartilage or move the patella to a better position. This is rare. Follow these instructions at home: If you have a shoe or brace:  Wear the shoe or brace as told by your health care provider. Remove it only as told by your health care provider.  Loosen the shoe or brace if your toes tingle, become numb, or turn cold and blue.  Keep the shoe or brace clean.  If the shoe or brace is not waterproof: ? Do not  let it get wet. ? Cover it with a watertight covering when you take a bath or a shower. Managing pain, stiffness, and swelling  If directed, put ice on the painful area. ? If you have a removable shoe or brace, remove it as told by your health care provider. ? Put ice in a plastic bag. ? Place a towel between your skin and the bag. ? Leave the ice on for 20 minutes, 2-3 times a day.  Move your toes often to avoid stiffness and to lessen swelling.  Rest your knee: ? Avoid activities that cause knee pain. ? When sitting or lying down, raise (elevate) the injured area above the level of your heart, whenever possible. General instructions  Take over-the-counter and prescription medicines only as told by your health care provider.  Use splints, braces, knee supports, or walking aids as directed by your health care provider.  Perform stretching and strengthening exercises as told by your health care provider or physical therapist.  Do not use any products that contain nicotine or tobacco, such as cigarettes and e-cigarettes. These can delay healing. If you need help quitting, ask your health care provider.  Return to your normal activities as told by your health care provider. Ask your health care provider what activities are safe for you.  Keep all follow-up visits as told by your health care provider. This is important. Contact a health care provider if:  Your symptoms get worse.  You are not improving with home care. Summary  Patellofemoral pain syndrome is a condition in which the tissue (cartilage) on the underside of the kneecap (patella) softens or breaks down.  This condition causes swelling in the joint linings and bone surfaces in the knee. This leads to pain in the front of the knee.  This condition may be treated at home with rest, ice, compression, and elevation (RICE).  Use splints, braces, knee supports, or walking aids as directed by your health care provider. This  information is not intended to replace advice given to you by your health care provider. Make sure you discuss any questions you have with your health care provider. Document Released: 05/10/2009 Document Revised: 07/02/2017 Document Reviewed: 07/02/2017 Elsevier Interactive Patient Education  2019 [employer].   How to Use a Knee Brace  A knee brace is a device that you wear to support your knee, especially if you have arthritis or the knee is healing after an injury or surgery. There are several types of knee braces. Some are designed to prevent an injury (prophylactic brace). These are often worn during sports. Others support an injured knee (functional brace) or keep it still while it heals (rehabilitative brace). People with severe arthritis of the knee may benefit from a brace that takes some pressure off the knee (unloader brace). Most knee braces are made from a combination of cloth and metal or plastic. You may need to wear a knee brace:  To relieve knee pain.  To help your knee support your weight (improve stability).  To help you walk farther, or to move more easily (improve mobility).  To prevent injury.  To support your knee while it heals from surgery or from an injury. What are the risks? Generally, knee braces are safe to wear. However, problems may occur, including:  Skin irritation that may cause pain and lead to infection.  Making your condition worse if you wear the brace in the wrong way. How to use a knee brace Different braces will have different instructions for use. Your health care provider will tell you or show you:  How to put on your brace.  How to adjust the brace.  When and how often to wear the brace.  How to remove the brace.  If you need any assistive devices in addition to the brace, such as crutches or a cane. In general, your brace should:  Have the hinge of the brace line up with the bend of your knee.  Have straps, hooks, or tapes that  fasten snugly around your leg.  Not feel too tight or too loose. How to care for a knee brace  Check your brace often for signs of damage, such as loose connections or attachments. Your knee brace may get damaged or wear out during normal use.  Wash the fabric parts of your brace with soap and water.  Read the insert that comes with your brace for other specific care instructions. Contact a health care provider if your knee brace:  Is too loose or too tight and you cannot adjust it.  Causes pain, skin redness, swelling, bruising, or irritation.  Is not helping to relieve your problem.  Is making your knee pain worse. Summary  A knee brace is a device that you wear to support your knee, especially if you have arthritis or your knee is healing after an injury or surgery.  Different braces will have different instructions for use. Your health care provider will tell you or show you how to use your knee brace.  Check your brace often for signs of damage, such as loose connections or attachments. Your knee brace may get damaged or wear out during normal use.  Wear your knee brace as told by your health care provider.  Contact a health care provider if your knee brace is not helping to relieve your problem or is making your knee pain worse. This information is not intended to replace advice given to you by your health care provider. Make sure you discuss any questions you have with your health care provider. Document Released: 08/12/2003 Document Revised: 12/05/2017 Document Reviewed: 12/05/2017 Elsevier Interactive Patient Education  2019 [employer].

## 2018-08-16 DIAGNOSIS — H9190 Unspecified hearing loss, unspecified ear: Secondary | ICD-10-CM | POA: Diagnosis not present

## 2018-08-16 DIAGNOSIS — M26609 Unspecified temporomandibular joint disorder, unspecified side: Secondary | ICD-10-CM | POA: Diagnosis not present

## 2018-08-27 ENCOUNTER — Telehealth: Payer: Self-pay

## 2018-08-27 ENCOUNTER — Ambulatory Visit: Payer: Medicare Other

## 2018-08-27 NOTE — Telephone Encounter (Signed)
.     Cardiac Questionnaire:    Since your last visit or hospitalization:    1. Have you been having new or worsening chest pain? NO   2. Have you been having new or worsening shortness of breath? NO 3. Have you been having new or worsening leg swelling, wt gain, or increase in abdominal girth (pants fitting more tightly)? NO   4. Have you had any passing out spells? NO    *A YES to any of these questions would result in the appointment being kept. *If all the answers to these questions are NO, we should indicate that given the current situation regarding the worldwide coronarvirus pandemic, at the recommendation of the CDC, we are looking to limit gatherings in our waiting area, and thus will reschedule their appointment beyond four weeks from today.   _____________   [id number]-[id number] Pre-Screening Questions:

## 2018-08-29 ENCOUNTER — Ambulatory Visit: Payer: Medicare Other | Admitting: Certified Nurse Midwife

## 2018-08-29 DIAGNOSIS — Z48815 Encounter for surgical aftercare following surgery on the digestive system: Secondary | ICD-10-CM | POA: Diagnosis not present

## 2018-08-29 DIAGNOSIS — Z713 Dietary counseling and surveillance: Secondary | ICD-10-CM | POA: Diagnosis not present

## 2018-08-29 DIAGNOSIS — K5900 Constipation, unspecified: Secondary | ICD-10-CM | POA: Diagnosis not present

## 2018-08-29 DIAGNOSIS — Z9884 Bariatric surgery status: Secondary | ICD-10-CM | POA: Diagnosis not present

## 2018-08-30 ENCOUNTER — Ambulatory Visit: Payer: Medicare Other | Admitting: Cardiology

## 2018-09-02 ENCOUNTER — Encounter: Payer: Medicare Other | Admitting: Internal Medicine

## 2018-09-02 ENCOUNTER — Other Ambulatory Visit: Payer: Self-pay

## 2018-09-06 ENCOUNTER — Telehealth: Payer: Self-pay

## 2018-09-06 NOTE — Telephone Encounter (Signed)

## 2018-09-06 NOTE — Telephone Encounter (Signed)

## 2018-09-06 NOTE — Telephone Encounter (Deleted)

## 2018-09-11 ENCOUNTER — Other Ambulatory Visit: Payer: Self-pay

## 2018-09-11 ENCOUNTER — Encounter: Payer: Self-pay | Admitting: Cardiology

## 2018-09-11 ENCOUNTER — Telehealth (INDEPENDENT_AMBULATORY_CARE_PROVIDER_SITE_OTHER): Payer: Medicare Other | Admitting: Cardiology

## 2018-09-11 VITALS — Ht 65.5 in | Wt 179.0 lb

## 2018-09-11 DIAGNOSIS — I5032 Chronic diastolic (congestive) heart failure: Secondary | ICD-10-CM

## 2018-09-11 DIAGNOSIS — I10 Essential (primary) hypertension: Secondary | ICD-10-CM

## 2018-09-11 NOTE — Progress Notes (Signed)
Virtual Visit via Video Note   This visit type was conducted due to national recommendations for restrictions regarding the COVID-19 Pandemic (e.g. social distancing) in an effort to limit this patient's exposure and mitigate transmission in our community.  Due to her co-morbid illnesses, this patient is at least at moderate risk for complications without adequate follow up.  This format is felt to be most appropriate for this patient at this time.  All issues noted in this document were discussed and addressed.  A limited physical exam was performed with this format.  Please refer to the patient's chart for her consent to telehealth for [facility].   Evaluation Performed:  Follow-up visit  Date:  09/11/2018   ID:  [name], [name] [birth year], MRN [id number]  Patient Location: Home  Provider Location: Home  PCP:  Abigail Roan, MD  Cardiologist:  Abigail Dawley, MD  Electrophysiologist:  None   Chief Complaint:  F/u after stress test, chronic CP  History of Present Illness:    [name] is a 53 y.o. female who presents via audio/video conferencing for a telehealth visit today.   She is a 53 y/o AA female, previously followed by Dr. Aundra Richardson,  who has a h/o CP. Multiple episodes over the years. She has undergone extensive cardiac w/u in the past, that has been fairly normal, including the following. She had an abnormal stress test in 2008 in [place] and it sounds like she went to [facility] for a cardiac catheterization which, per her report, was normal. Stress echo at [facility] in 2010 was submaximal but showed no evidence for ischemia. ETT-myoview (11/12): 7'15", EF 66%, significant ST depression on exercise ECG, apical thinning on myoview images but no evidence for ischemia. Coronary CT angiogram (11/12): calcium score 0, difficult study with gating artifact, mid RCA nonevaluatable but no stenosis elsewhere in the coronaries. Echo (11/12): EF 55-60%, mild LV  hypertrophy, mild MR. NSTEMI in [place] in 5/13 with elevated troponin but LHC showed normal coronaries. Echo (5/13) with EF 60-65%, normal valve, no LVH. 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 Echo (3/16) with EF 55-60%, mild LVH, normal RV size and systolic function.   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 She is not taking amlodipine due to lower extremity swelling and is not taking ranolazine since it seemed ineffective. She is off aspirin with gastric polyps. She has been treated with metoprolol, verapamil, and Imdur. She is also intolerant to statins given issues with myalgias. She was seen in clinic by Dr. Aundra Richardson, 05/22/15, and was felt to be stable from a cardiac standpoint.  She was seen back in 2017 and endorsed recurrent CP and exertional dyspnea. Set up for RHC. Right heart cath in 05/24/2016 showed normal right and left heart filling pressures, no pulmonary hypertension and normal cardiac output.  Most recently, she complained of palpitations and  Syncope x 3.  Echo ordered by her PCP was unchanged. LVEF low normal. No valvular dysfunction. Mild diastolic relation was noted. A 30 day monitor showed SR, ST and infrequent PVCs. There were no arrhythmias associated with her reports of dizziness and palpitations.   07/22/18, she was seen by Abigail Ades, NP, and endorsed CP, exertional dyspnea and unexplained weight loss. Had also ran out of spironolactone and had not taken medication in several weeks. [name] was resumed. BNP was checked and normal ruling out acute CHF. NST  ordered. It was low risk. No ischemia. EF normal.   She reports feeling better since her last office visit.  Has been feeling better since restarting spironolactone.  Unfortunately she does not have a home blood pressure cuff available but is planning to get one in the near future.  She also  notes a correlation between her diet and symptoms.  She feels her best whenever she avoids too much salt.  If she eats too much salt she notes fluid retention and dyspnea.  She denies any further palpitations.  No recurrent syncope.  Feeling great today. She also has obstructive sleep apnea and is on CPAP but admits that she is not fully compliant with this.  She often falls asleep while watching television and forgets to put her CPAP device on.  She will work on improving compliance.  The patient does not have symptoms concerning for COVID-19 infection (fever, chills, cough, or new shortness of breath).    Past Medical History:  Diagnosis Date   Asthma    Biliary dyskinesia    a. s/p cholecystectomy.   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 Abnl stress [place] 2008, f/u cath reportedly nl. b. ETT-Myoview 04/2011 - EKG changes but normal perfusion. Cor CT - no coronary calcium, no definite stenosis though mRCA not fully evaluated. c. 10/2011 - tn elevated in [place], LHC without CAD. Started on Ranexa, anti-anginals ?microvascular dz but later stopped while in hospital on abx.   Complication of anesthesia    hard to wake up-had to be reminded to breath   GERD (gastroesophageal reflux disease)    a. Severe.   HTN (hypertension)    Hx of cardiovascular stress test    [place] Myoview 8/14:  Normal, EF 74%   Hx of echocardiogram    Echo 3/16:  Mild LVH, EF 55-60%, Gr 1 DD, trivial MR, mild LAE, normal RVF   Hypothyroid    MI (myocardial infarction) ([place])    Migraine    Morbid obesity ([place]) 07/26/2017   MRSA infection    a. After vagal nerve stimulator at [facility] - surgical site MRSA infection, PICC placed.   Obesity    OSA (obstructive sleep apnea) 07/26/2017   Palpitations    a. 08/2014: 48 hour holter with 2 PVCs otherwise normal.   Seizure disorder ([place])    a. since childhood. b. s/p vagal nerve stimulator at [facility].   Seizures [facility])    Past Surgical  History:  Procedure Laterality Date   [place] N/A 06/01/2016   Procedure: Right Heart Cath;  Surgeon: Larey Dresser, MD;  Location: [place] [facility];  Service: Cardiovascular;  Laterality: N/A;   CESAREAN SECTION     placement of vagal nerve stimulator.   CHOLECYSTECTOMY N/A 01/24/2013   Procedure: LAPAROSCOPIC CHOLECYSTECTOMY;  Surgeon: Harl Bowie, MD;  Location: [place];  Service: General;  Laterality: N/A;   IMPLANTATION VAGAL NERVE STIMULATOR  [phone]   battery [facility]     Current Meds  Medication Sig   acetaminophen (TYLENOL) 325 MG tablet Take 975 mg by mouth every 6 (six) hours as needed for headache.   albuterol (PROVENTIL HFA;VENTOLIN HFA) 108 (90 Base) MCG/ACT inhaler Inhale 2 puffs into the lungs every 6 (six) hours as needed for wheezing or shortness of breath.   carvedilol (COREG) 25 MG tablet Take 25 mg by mouth 2 (two) times daily.  cyclobenzaprine (FLEXERIL) 5 MG tablet Take 1 tablet (5 mg total) by mouth 2 (two) times daily.   diltiazem (CARDIZEM CD) 120 MG 24 hr capsule Take 1 capsule (120 mg total) by mouth daily.   fluticasone (FLONASE) 50 MCG/ACT nasal spray Place 1 spray into both nostrils 2 (two) times daily as needed for allergies.    furosemide (LASIX) 20 MG tablet Take 1 tablet (20 mg total) by mouth daily.   gabapentin (NEURONTIN) 300 MG capsule Take 1 capsule (300 mg total) by mouth at bedtime.   lamoTRIgine (LAMICTAL) 100 MG tablet Take 1 tablet (100 mg total) by mouth 2 (two) times daily.   levothyroxine (SYNTHROID, LEVOTHROID) 50 MCG tablet Take 50 mcg by mouth daily.    lidocaine (XYLOCAINE) 2 % jelly Apply 1 application topically as needed.   lisinopril (PRINIVIL,ZESTRIL) 20 MG tablet Take 1 tablet (20 mg total) by mouth daily.   loperamide (IMODIUM) 2 MG capsule Take 1 capsule (2 mg total) by mouth as needed for diarrhea or loose  stools.   mometasone-formoterol (DULERA) 100-5 MCG/ACT AERO Inhale 2 puffs into the lungs 2 (two) times daily.   nitroGLYCERIN (NITRODUR - DOSED IN MG/24 HR) 0.2 mg/hr patch PLACE 1 PATCH ONTO THE SKIN DAILY.   nitroGLYCERIN (NITROSTAT) 0.4 MG SL tablet PLACE 1 TAB UNDER TONGUE EVERY 5 MINUTES AS NEEDED FOR CHEST PAIN, MAX 3/15 MINS   pantoprazole (PROTONIX) 40 MG tablet Take 1 tablet (40 mg total) by mouth 2 (two) times daily.   promethazine (PHENERGAN) 25 MG tablet Take 25 mg by mouth every 8 (eight) hours as needed for nausea/vomiting.   spironolactone (ALDACTONE) 25 MG tablet Take 1 tablet (25 mg total) by mouth daily.   topiramate (TOPAMAX) 200 MG tablet Take 1 tablet (200 mg total) by mouth 2 (two) times daily.     Allergies:   Amitriptyline; Amlodipine; Latex; Amantadines; Aspirin; Simvastatin; and Tape   Social History   Tobacco Use   Smoking status: Never Smoker   Smokeless tobacco: Never Used  Substance Use Topics   Alcohol use: Yes    Alcohol/week: 1.0 - 3.0 standard drinks    Types: 1 - 3 Glasses of wine per week    Comment: occasional   Drug use: No     Family Hx: The patient's family history includes Cancer in an other family member; Coronary artery disease in her maternal grandmother; Coronary artery disease (age of onset: 30) in her mother; Hypertension in an other family member; Kidney disease in her father; Other in her father; Stroke in an other family member.  ROS:   Please see the history of present illness.     All other systems reviewed and are negative.   Prior CV studies:   The following studies were reviewed today:  [place] 05/2016 Procedures   Right Heart Cath  Conclusion   Normal right and left heart filling pressures, no pulmonary hypertension.  Normal cardiac output.   No changes.     NST 07/2018 Study Highlights    The left ventricular ejection fraction is mildly decreased (45-54%).  Nuclear stress EF: 50%.  The study is  normal.  This is a low risk study.   2D Echo 07/17/18  1. The left ventricle has low normal systolic function, with an ejection fraction of 50-55%. The cavity size was mildly dilated. Left ventricular diastolic Doppler parameters are consistent with impaired relaxation.  2. The right ventricle has normal systolic function. The cavity was normal. There is no increase  in right ventricular wall thickness.  3. The mitral valve is normal in structure.  4. The tricuspid valve is normal in structure.  5. The aortic valve is normal in structure.  6. The pulmonic valve was normal in structure.  7. Right atrial pressure is estimated at 3 mmHg.   Labs/Other Tests and Data Reviewed:    EKG:  An ECG dated 07/22/18 was personally reviewed today and demonstrated:  NSR, nonspecific inferior ST abnormalities  Recent Labs: 01/01/2018: ALT 34; Hemoglobin 11.1; Platelets 196 07/22/2018: BUN 12; Creatinine, Ser 0.76; NT-Pro BNP 55; Potassium 3.5; Sodium 142   Recent Lipid Panel Lab Results  Component Value Date/Time   CHOL 200 (H) 05/21/2017 11:11 AM   TRIG 58 05/21/2017 11:11 AM   HDL 59 05/21/2017 11:11 AM   CHOLHDL 3.4 05/21/2017 11:11 AM   CHOLHDL 3 09/30/2012 09:29 AM   LDLCALC 129 (H) 05/21/2017 11:11 AM    Wt Readings from Last 3 Encounters:  09/11/18 179 lb (81.2 kg)  08/13/18 189 lb 3.2 oz (85.8 kg)  07/22/18 191 lb (86.6 kg)     Objective:    Vital Signs:  Ht 5' 5.5" (1.664 m)    Wt 179 lb (81.2 kg)    BMI 29.33 kg/m    Well nourished, well developed female in no acute distress. Alert and oriented x3 Respirations appear normal.  No use of accessory muscles.  Breathing unlabored. Normal affect.    ASSESSMENT & PLAN:    1.  Chest pain: As outlined above, extensive work-up done in the past including normal cardiac catheterization.  Most recently she had a nuclear stress test February 2020 that showed no ischemia.  She denies any recurrent chest pain since her last office visit.   Continue management of cardiac risk factors.  2.  Palpitations: Recent outpatient monitor showed normal sinus rhythm, occasional sinus tach and PACs but no worrisome arrhythmias.  She denies any recurrent symptoms since her last office visit.  3.  Syncope: Recent outpatient monitor showed no arrhythmias.  Echocardiogram showed normal left ventricular EF normal and no valvular dysfunction.  Stress test negative for ischemia.  She denies any recurrence.  4.  Diastolic dysfunction: Noted on echocardiogram.  Patient notes correlation between diet and symptoms.  She notices more fluid retention and dyspnea if she eats too much salt.  She has been trying to avoid eating too much salt.  She is back on spironolactone which she feels much better with.  We discussed the importance of sodium restriction.  Continue spironolactone.  5. Obstructive sleep apnea: Prescribed CPAP but patient admits that she is not 100% compliant.  She often falls asleep while watching television and forgets to wear her CPAP device.  We discussed the importance of treatment.  She will work on improving compliance.  6.  Hypertension: On multiple medications including Coreg, Cardizem, lisinopril, spironolactone and Lasix. Recent laboratory work at last office visit showed normal potassium and renal function. Unable to get reading on patient today as she does not currently have a home blood pressure cuff.  She will work to obtain access to a blood pressure monitor.  Patient educated that blood pressure goal should be less than 130/80.  She will notify our office if she has any persistent readings above goal.  We also discussed the importance of eating a low-salt diet as well as regular physical activity.  I encouraged walking for exercise at least 30 minutes daily.  COVID-19 Education: The signs and symptoms of COVID-19  were discussed with the patient and how to seek care for testing (follow up with PCP or arrange E-visit).  The importance  of social distancing was discussed today.  Time:   Today, I have spent 15 minutes with the patient with telehealth technology discussing the above problems.     Medication Adjustments/Labs and Tests Ordered: Current medicines are reviewed at length with the patient today.  Concerns regarding medicines are outlined above.  Tests Ordered: No orders of the defined types were placed in this encounter.  Medication Changes: No orders of the defined types were placed in this encounter.   Disposition:  Follow up in 1 year with Dr. Meda Coffee.  Signed, Lyda Jester, PA-C  09/11/2018 1:55 PM    [facility]

## 2018-09-11 NOTE — Patient Instructions (Signed)
Medication Instructions:  none If you need a refill on your cardiac medications before your next appointment, please call your pharmacy.   Lab work: none If you have labs (blood work) drawn today and your tests are completely normal, you will receive your results only by: Marland Kitchen MyChart Message (if you have MyChart) OR . A paper copy in the mail If you have any lab test that is abnormal or we need to change your treatment, we will call you to review the results.  Testing/Procedures: none  Follow-Up: 1 YEAR with Dr Meda Coffee  At [facility], you and your health needs are our priority.  As part of our continuing mission to provide you with exceptional heart care, we have created designated Provider Care Teams.  These Care Teams include your primary Cardiologist (physician) and Advanced Practice Providers (APPs -  Physician Assistants and Nurse Practitioners) who all work together to provide you with the care you need, when you need it.  Any Other Special Instructions Will Be Listed Below (If Applicable). RECOMMENDATIONS:  It is important to get regular exercise  Improve Compliance with CPAP at Night  BP GOAL less than 130/80  DASH Eating Plan DASH stands for "Dietary Approaches to Stop Hypertension." The DASH eating plan is a healthy eating plan that has been shown to reduce high blood pressure (hypertension). It may also reduce your risk for type 2 diabetes, heart disease, and stroke. The DASH eating plan may also help with weight loss. What are tips for following this plan?  General guidelines  Avoid eating more than 2,300 mg (milligrams) of salt (sodium) a day. If you have hypertension, you may need to reduce your sodium intake to 1,500 mg a day.  Limit alcohol intake to no more than 1 drink a day for nonpregnant women and 2 drinks a day for men. One drink equals 12 oz of beer, 5 oz of wine, or 1 oz of hard liquor.  Work with your health care provider to maintain a healthy body  weight or to lose weight. Ask what an ideal weight is for you.  Get at least 30 minutes of exercise that causes your heart to beat faster (aerobic exercise) most days of the week. Activities may include walking, swimming, or biking.  Work with your health care provider or diet and nutrition specialist (dietitian) to adjust your eating plan to your individual calorie needs. Reading food labels   Check food labels for the amount of sodium per serving. Choose foods with less than 5 percent of the Daily Value of sodium. Generally, foods with less than 300 mg of sodium per serving fit into this eating plan.  To find whole grains, look for the word "whole" as the first word in the ingredient list. Shopping  Buy products labeled as "low-sodium" or "no salt added."  Buy fresh foods. Avoid canned foods and premade or frozen meals. Cooking  Avoid adding salt when cooking. Use salt-free seasonings or herbs instead of table salt or sea salt. Check with your health care provider or pharmacist before using salt substitutes.  Do not fry foods. Cook foods using healthy methods such as baking, boiling, grilling, and broiling instead.  Cook with heart-healthy oils, such as olive, canola, soybean, or sunflower oil. Meal planning  Eat a balanced diet that includes: ? 5 or more servings of fruits and vegetables each day. At each meal, try to fill half of your plate with fruits and vegetables. ? Up to 6-8 servings of whole grains  each day. ? Less than 6 oz of lean meat, poultry, or fish each day. A 3-oz serving of meat is about the same size as a deck of cards. One egg equals 1 oz. ? 2 servings of low-fat dairy each day. ? A serving of nuts, seeds, or beans 5 times each week. ? Heart-healthy fats. Healthy fats called Omega-3 fatty acids are found in foods such as flaxseeds and coldwater fish, like sardines, salmon, and mackerel.  Limit how much you eat of the following: ? Canned or prepackaged foods. ?  Food that is high in trans fat, such as fried foods. ? Food that is high in saturated fat, such as fatty meat. ? Sweets, desserts, sugary drinks, and other foods with added sugar. ? Full-fat dairy products.  Do not salt foods before eating.  Try to eat at least 2 vegetarian meals each week.  Eat more home-cooked food and less restaurant, buffet, and fast food.  When eating at a restaurant, ask that your food be prepared with less salt or no salt, if possible. What foods are recommended? The items listed may not be a complete list. Talk with your dietitian about what dietary choices are best for you. Grains Whole-grain or whole-wheat bread. Whole-grain or whole-wheat pasta. Brown rice. [place]. Bulgur. Whole-grain and low-sodium cereals. Pita bread. Low-fat, low-sodium crackers. Whole-wheat flour tortillas. Vegetables Fresh or frozen vegetables (raw, steamed, roasted, or grilled). Low-sodium or reduced-sodium tomato and vegetable juice. Low-sodium or reduced-sodium tomato sauce and tomato paste. Low-sodium or reduced-sodium canned vegetables. Fruits All fresh, dried, or frozen fruit. Canned fruit in natural juice (without added sugar). Meat and other protein foods Skinless chicken or [place]. Ground chicken or [place]. Pork with fat trimmed off. Fish and seafood. Egg whites. Dried beans, peas, or lentils. Unsalted nuts, nut butters, and seeds. Unsalted canned beans. Lean cuts of beef with fat trimmed off. Low-sodium, lean deli meat. Dairy Low-fat (1%) or fat-free (skim) milk. Fat-free, low-fat, or reduced-fat cheeses. Nonfat, low-sodium ricotta or cottage cheese. Low-fat or nonfat yogurt. Low-fat, low-sodium cheese. Fats and oils Soft margarine without trans fats. Vegetable oil. Low-fat, reduced-fat, or light mayonnaise and salad dressings (reduced-sodium). Canola, safflower, olive, soybean, and sunflower oils. Avocado. Seasoning and other foods Herbs. Spices. Seasoning mixes without  salt. Unsalted popcorn and pretzels. Fat-free sweets. What foods are not recommended? The items listed may not be a complete list. Talk with your dietitian about what dietary choices are best for you. Grains Baked goods made with fat, such as croissants, muffins, or some breads. Dry pasta or rice meal packs. Vegetables Creamed or fried vegetables. Vegetables in a cheese sauce. Regular canned vegetables (not low-sodium or reduced-sodium). Regular canned tomato sauce and paste (not low-sodium or reduced-sodium). Regular tomato and vegetable juice (not low-sodium or reduced-sodium). Angie Fava. Olives. Fruits Canned fruit in a light or heavy syrup. Fried fruit. Fruit in cream or butter sauce. Meat and other protein foods Fatty cuts of meat. Ribs. Fried meat. Berniece Salines. Sausage. Bologna and other processed lunch meats. Salami. Fatback. Hotdogs. Bratwurst. Salted nuts and seeds. Canned beans with added salt. Canned or smoked fish. Whole eggs or egg yolks. Chicken or [place] with skin. Dairy Whole or 2% milk, cream, and half-and-half. Whole or full-fat cream cheese. Whole-fat or sweetened yogurt. Full-fat cheese. Nondairy creamers. Whipped toppings. Processed cheese and cheese spreads. Fats and oils Butter. Stick margarine. Lard. Shortening. Ghee. Bacon fat. Tropical oils, such as coconut, palm kernel, or palm oil. Seasoning and other foods Salted popcorn and  pretzels. Onion salt, garlic salt, seasoned salt, table salt, and sea salt. Worcestershire sauce. Tartar sauce. Barbecue sauce. Teriyaki sauce. Soy sauce, including reduced-sodium. Steak sauce. Canned and packaged gravies. Fish sauce. Oyster sauce. Cocktail sauce. Horseradish that you find on the shelf. Ketchup. Mustard. Meat flavorings and tenderizers. Bouillon cubes. Hot sauce and Tabasco sauce. Premade or packaged marinades. Premade or packaged taco seasonings. Relishes. Regular salad dressings. Where to find more information:  National Heart, Lung, and  [place]: https://wilson-eaton.com/  American Heart Association: www.heart.org Summary  The DASH eating plan is a healthy eating plan that has been shown to reduce high blood pressure (hypertension). It may also reduce your risk for type 2 diabetes, heart disease, and stroke.  With the DASH eating plan, you should limit salt (sodium) intake to 2,300 mg a day. If you have hypertension, you may need to reduce your sodium intake to 1,500 mg a day.  When on the DASH eating plan, aim to eat more fresh fruits and vegetables, whole grains, lean proteins, low-fat dairy, and heart-healthy fats.  Work with your health care provider or diet and nutrition specialist (dietitian) to adjust your eating plan to your individual calorie needs. This information is not intended to replace advice given to you by your health care provider. Make sure you discuss any questions you have with your health care provider. Document Released: 05/11/2011 Document Revised: 05/15/2016 Document Reviewed: 05/15/2016 Elsevier Interactive Patient Education  2019 [employer].

## 2018-09-24 ENCOUNTER — Telehealth: Payer: Self-pay | Admitting: *Deleted

## 2018-09-24 ENCOUNTER — Other Ambulatory Visit: Payer: Self-pay

## 2018-09-24 ENCOUNTER — Encounter (HOSPITAL_COMMUNITY): Payer: Self-pay

## 2018-09-24 ENCOUNTER — Emergency Department (HOSPITAL_COMMUNITY): Payer: Medicare Other

## 2018-09-24 ENCOUNTER — Emergency Department (HOSPITAL_COMMUNITY)
Admission: EM | Admit: 2018-09-24 | Discharge: 2018-09-24 | Disposition: A | Discharge status: Home / Self Care | Payer: Medicare Other | Attending: Emergency Medicine | Admitting: Emergency Medicine

## 2018-09-24 DIAGNOSIS — I5032 Chronic diastolic (congestive) heart failure: Secondary | ICD-10-CM | POA: Insufficient documentation

## 2018-09-24 DIAGNOSIS — J45909 Unspecified asthma, uncomplicated: Secondary | ICD-10-CM | POA: Diagnosis not present

## 2018-09-24 DIAGNOSIS — Z91040 Latex allergy status: Secondary | ICD-10-CM | POA: Diagnosis not present

## 2018-09-24 DIAGNOSIS — R05 Cough: Secondary | ICD-10-CM | POA: Diagnosis not present

## 2018-09-24 DIAGNOSIS — R0789 Other chest pain: Secondary | ICD-10-CM | POA: Insufficient documentation

## 2018-09-24 DIAGNOSIS — I110 Hypertensive heart disease with heart failure: Secondary | ICD-10-CM | POA: Insufficient documentation

## 2018-09-24 DIAGNOSIS — Z79899 Other long term (current) drug therapy: Secondary | ICD-10-CM | POA: Insufficient documentation

## 2018-09-24 DIAGNOSIS — I2510 Atherosclerotic heart disease of native coronary artery without angina pectoris: Secondary | ICD-10-CM | POA: Insufficient documentation

## 2018-09-24 DIAGNOSIS — E039 Hypothyroidism, unspecified: Secondary | ICD-10-CM | POA: Insufficient documentation

## 2018-09-24 DIAGNOSIS — R0600 Dyspnea, unspecified: Secondary | ICD-10-CM | POA: Diagnosis not present

## 2018-09-24 LAB — BASIC METABOLIC PANEL
Anion gap: 8 (ref 5–15)
BUN: 15 mg/dL (ref 6–20)
CO2: 20 mmol/L — ABNORMAL LOW (ref 22–32)
Calcium: 8.8 mg/dL — ABNORMAL LOW (ref 8.9–10.3)
Chloride: 114 mmol/L — ABNORMAL HIGH (ref 98–111)
Creatinine, Ser: 0.59 mg/dL (ref 0.44–1.00)
GFR calc Af Amer: >60 mL/min (ref >60)
GFR calc non Af Amer: >60 mL/min (ref >60)
Glucose, Bld: 95 mg/dL (ref 70–99)
Potassium: 3.4 mmol/L — ABNORMAL LOW (ref 3.5–5.1)
Sodium: 142 mmol/L (ref 135–145)

## 2018-09-24 LAB — CBC WITH DIFFERENTIAL/PLATELET
Abs Immature Granulocytes: 0.01 10*3/uL (ref 0.00–0.07)
Basophils Absolute: 0 10*3/uL (ref 0.0–0.1)
Basophils Relative: 1 %
Eosinophils Absolute: 0.3 10*3/uL (ref 0.0–0.5)
Eosinophils Relative: 6 %
HCT: 36.4 % (ref 36.0–46.0)
Hemoglobin: 11.2 g/dL — ABNORMAL LOW (ref 12.0–15.0)
Immature Granulocytes: 0 %
Lymphocytes Relative: 46 %
Lymphs Abs: 2.4 10*3/uL (ref 0.7–4.0)
MCH: 27.5 pg (ref 26.0–34.0)
MCHC: 30.8 g/dL (ref 30.0–36.0)
MCV: 89.4 fL (ref 80.0–100.0)
Monocytes Absolute: 0.4 10*3/uL (ref 0.1–1.0)
Monocytes Relative: 8 %
Neutro Abs: 2.1 10*3/uL (ref 1.7–7.7)
Neutrophils Relative %: 39 %
Platelets: 209 10*3/uL (ref 150–400)
RBC: 4.07 MIL/uL (ref 3.87–5.11)
RDW: 13.3 % (ref 11.5–15.5)
WBC: 5.2 10*3/uL (ref 4.0–10.5)
nRBC: 0 % (ref 0.0–0.2)

## 2018-09-24 LAB — TROPONIN I: Troponin I: <0.03 ng/mL (ref <0.03)

## 2018-09-24 NOTE — Telephone Encounter (Signed)
Discussed with [name] given information would prefer pt get tested given her comorbidities symptoms and the contact.  She will require testing at [facility].

## 2018-09-24 NOTE — ED Provider Notes (Signed)
[facility] DEPT Provider Note   CSN: [id number] Arrival date & time: 09/24/18  1634    History   Chief Complaint Chief Complaint  Patient presents with  . Fever  . Shortness of Breath  . Sore Throat  . Cough  . Generalized Body Aches    HPI [name] is a 53 y.o. female.     Patient is a 53 year old female with past medical history of coronary artery disease with prior MI in 2004 and 2014.  She also has a history of CHF, hypertension.  She presents today with complaints of cough, feeling fevered, sore throat, and chest discomfort.  This is been ongoing for the past several days.  She reports being exposed to a friend of the family who was recently diagnosed with COVID-19.  The history is provided by the patient.  Fever  Max temp prior to arrival:  101 Temp source:  Oral Severity:  Mild Onset quality:  Sudden Duration:  2 days Timing:  Intermittent Progression:  Worsening Chronicity:  New Relieved by:  Nothing Worsened by:  Nothing Associated symptoms: cough   Shortness of Breath  Associated symptoms: cough and fever   Sore Throat  Associated symptoms include shortness of breath.  Cough  Associated symptoms: fever and shortness of breath     Past Medical History:  Diagnosis Date  . Asthma   . Biliary dyskinesia    a. s/p cholecystectomy.  . 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 Abnl stress [place] 2008, f/u cath reportedly nl. b. ETT-Myoview 04/2011 - EKG changes but normal perfusion. Cor CT - no coronary calcium, no definite stenosis though mRCA not fully evaluated. c. 10/2011 - tn elevated in [place], LHC without CAD. Started on Ranexa, anti-anginals ?microvascular dz but later stopped while in hospital on abx.  . Complication of anesthesia    hard to wake up-had to be reminded to breath  . GERD (gastroesophageal reflux disease)    a. Severe.  Marland Kitchen HTN (hypertension)   . Hx of cardiovascular stress test    [facility] Myoview 8/14:  Normal, EF 74%  . Hx of echocardiogram    Echo 3/16:  Mild LVH, EF 55-60%, Gr 1 DD, trivial MR, mild LAE, normal RVF  . Hypothyroid   . MI (myocardial infarction) ([place])   . Migraine   . Morbid obesity ([place]) 07/26/2017  . MRSA infection    a. After vagal nerve stimulator at [facility] - surgical site MRSA infection, PICC placed.  . Obesity   . OSA (obstructive sleep apnea) 07/26/2017  . Palpitations    a. 08/2014: 48 hour holter with 2 PVCs otherwise normal.  . Seizure disorder ([place])    a. since childhood. b. s/p vagal nerve stimulator at [facility].  . Seizures [facility])     Patient Active Problem List   Diagnosis Date Noted  . Acute pain of left knee 08/13/2018  . Depression 05/14/2018  . Syncope 05/13/2018  . Lumbar radiculopathy 03/26/2018  . Lumbar back pain with radiculopathy affecting right lower extremity 03/05/2018  . Right sided weakness 01/07/2018  . Muscle weakness (generalized) 01/07/2018  . Rectal pain 12/19/2017  . Flu-like symptoms 08/09/2017  . Diarrhea 08/09/2017  . OSA (obstructive sleep apnea) 07/26/2017  . Morbid obesity ([place]) 07/26/2017  . Visit for suture removal 04/20/2017  . Ear pain, bilateral 03/05/2017  . Arm pain, diffuse, right 01/19/2017  . Hydradenitis 11/09/2016  . Irritant contact dermatitis due to other  agents 11/09/2016  . Takotsubo cardiomyopathy 10/26/2016  . Low back pain 04/19/2016  . Abnormal weight gain 06/11/2015  . BMI 40.0-44.9, adult ([place]) 06/11/2015  . Asthma 08/24/2014  . Chronic diastolic heart failure ([place]) 06/19/2014  . Allergic rhinitis 05/04/2014  . Pharyngoesophageal dysphagia 01/30/2014  . Hypothyroidism 07/02/2013  . Chronic chest pain   . GERD (gastroesophageal reflux disease) 02/10/2013  . Biliary dyskinesia 12/30/2012  . Status post VNS (vagus nerve stimulator) placement 06/10/2012  . Refusal of blood transfusions as patient is Jehovah's Witness 05/20/2012  . Dyslipidemia 12/29/2011  . 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 Dyslipidemia, goal LDL below 70 05/17/2011  . Non-cardiac chest pain secondary to GERD 04/24/2011  . HTN, goal below 130/80 04/24/2011    Past Surgical History:  Procedure Laterality Date  . [place] N/A 06/01/2016   Procedure: Right Heart Cath;  Surgeon: Larey Dresser, MD;  Location: [facility];  Service: Cardiovascular;  Laterality: N/A;  . CESAREAN SECTION     placement of vagal nerve stimulator.  . CHOLECYSTECTOMY N/A 01/24/2013   Procedure: LAPAROSCOPIC CHOLECYSTECTOMY;  Surgeon: Harl Bowie, MD;  Location: [facility];  Service: General;  Laterality: N/A;  . IMPLANTATION VAGAL NERVE STIMULATOR  2000,2013   battery chg-[facility]     OB History   No obstetric history on file.      Home Medications    Prior to Admission medications   Medication Sig Start Date End Date Taking? Authorizing Provider  acetaminophen (TYLENOL) 325 MG tablet Take 975 mg by mouth every 6 (six) hours as needed for headache.    [provider]  albuterol (PROVENTIL HFA;VENTOLIN HFA) 108 (90 Base) MCG/ACT inhaler Inhale 2 puffs into the lungs every 6 (six) hours as needed for wheezing or shortness of breath. 09/29/16   Dorothy Spark, MD  carvedilol (COREG) 25 MG tablet Take 25 mg by mouth 2 (two) times daily. 07/27/17   [provider]  cyclobenzaprine (FLEXERIL) 5 MG tablet Take 1 tablet (5 mg total) by mouth 2 (two) times daily. 03/25/18   Kathi Ludwig, MD  diltiazem (CARDIZEM CD) 120 MG 24 hr capsule Take 1 capsule (120 mg total) by mouth daily. 06/28/18   Dorothy Spark, MD  fluticasone (FLONASE) 50 MCG/ACT nasal spray Place 1 spray into both nostrils 2 (two) times daily as needed for allergies.  04/17/14   [provider]  furosemide (LASIX) 20 MG tablet Take 1 tablet (20 mg total) by mouth  daily. 07/22/18   Daune Perch, NP  gabapentin (NEURONTIN) 300 MG capsule Take 1 capsule (300 mg total) by mouth at bedtime. 03/25/18 03/25/19  Kathi Ludwig, MD  lamoTRIgine (LAMICTAL) 100 MG tablet Take 1 tablet (100 mg total) by mouth 2 (two) times daily. 06/12/17   Marcial Pacas, MD  levothyroxine (SYNTHROID, LEVOTHROID) 50 MCG tablet Take 50 mcg by mouth daily.     [provider]  lidocaine (XYLOCAINE) 2 % jelly Apply 1 application topically as needed. 12/19/17   Isabelle Course, MD  lisinopril (PRINIVIL,ZESTRIL) 20 MG tablet Take 1 tablet (20 mg total) by mouth daily. 01/10/18 01/05/19  Daune Perch, NP  loperamide (IMODIUM) 2 MG capsule Take 1 capsule (2 mg total) by mouth as needed for diarrhea or loose stools. 08/09/17   Lorella Nimrod, MD  mometasone-formoterol (DULERA) 100-5 MCG/ACT AERO Inhale 2 puffs into the  lungs 2 (two) times daily. 09/26/16   Dorothy Spark, MD  nitroGLYCERIN (NITRODUR - DOSED IN MG/24 HR) 0.2 mg/hr patch PLACE 1 PATCH ONTO THE SKIN DAILY. 09/26/16   Dorothy Spark, MD  nitroGLYCERIN (NITROSTAT) 0.4 MG SL tablet PLACE 1 TAB UNDER TONGUE EVERY 5 MINUTES AS NEEDED FOR CHEST PAIN, MAX 3/15 MINS 06/04/18   Dorothy Spark, MD  pantoprazole (PROTONIX) 40 MG tablet Take 1 tablet (40 mg total) by mouth 2 (two) times daily. 08/05/16   Verlee Monte, MD  promethazine (PHENERGAN) 25 MG tablet Take 25 mg by mouth every 8 (eight) hours as needed for nausea/vomiting. 12/13/17   [provider]  spironolactone (ALDACTONE) 25 MG tablet Take 1 tablet (25 mg total) by mouth daily. 07/22/18   Daune Perch, NP  topiramate (TOPAMAX) 200 MG tablet Take 1 tablet (200 mg total) by mouth 2 (two) times daily. 06/12/17   Marcial Pacas, MD    Family History Family History  Problem Relation Age of Onset  . Coronary artery disease Mother 25  . Other Father        killed  . Kidney disease Father   . Coronary artery disease Maternal Grandmother   . Cancer Other   .  Hypertension Other   . Stroke Other     Social History Social History   Tobacco Use  . Smoking status: Never Smoker  . Smokeless tobacco: Never Used  Substance Use Topics  . Alcohol use: Yes    Alcohol/week: 1.0 - 3.0 standard drinks    Types: 1 - 3 Glasses of wine per week    Comment: occasional  . Drug use: No     Allergies   Amitriptyline; Amlodipine; Latex; Amantadines; Aspirin; Simvastatin; and Tape   Review of Systems Review of Systems  Constitutional: Positive for fever.  Respiratory: Positive for cough and shortness of breath.   All other systems reviewed and are negative.    Physical Exam Updated Vital Signs BP (!) 148/100 (BP Location: Left Arm)   Pulse 76   Temp 98.1 F (36.7 C) (Oral)   Resp 14   Ht 5' 5.5" (1.664 m)   Wt 81.6 kg   SpO2 100%   BMI 29.50 kg/m   Physical Exam Vitals signs and nursing note reviewed.  Constitutional:      General: She is not in acute distress.    Appearance: She is well-developed. She is not diaphoretic.  HENT:     Head: Normocephalic and atraumatic.     Mouth/Throat:     Mouth: Mucous membranes are moist.     Pharynx: Oropharynx is clear. No pharyngeal swelling or oropharyngeal exudate.  Neck:     Musculoskeletal: Normal range of motion and neck supple.  Cardiovascular:     Rate and Rhythm: Normal rate and regular rhythm.     Heart sounds: No murmur. No friction rub. No gallop.   Pulmonary:     Effort: Pulmonary effort is normal. No respiratory distress.     Breath sounds: Normal breath sounds. No wheezing.  Abdominal:     General: Bowel sounds are normal. There is no distension.     Palpations: Abdomen is soft.     Tenderness: There is no abdominal tenderness.  Musculoskeletal: Normal range of motion.  Skin:    General: Skin is warm and dry.  Neurological:     Mental Status: She is alert and oriented to person, place, and time.      ED Treatments /  Results  Labs (all labs ordered are listed, but  only abnormal results are displayed) Labs Reviewed  BASIC METABOLIC PANEL  CBC WITH DIFFERENTIAL/PLATELET  TROPONIN I    EKG None  Radiology No results found.  Procedures Procedures (including critical care time)  Medications Ordered in ED Medications - No data to display   Initial Impression / Assessment and Plan / ED Course  I have reviewed the triage vital signs and the nursing notes.  Pertinent labs & imaging results that were available during my care of the patient were reviewed by me and considered in my medical decision making (see chart for details).  Patient presents here with shortness of breath and concerns over COVID-19.  She states she was exposed to an acquaintance who was recently diagnosed with this.  Patient's vital signs are stable.  She is afebrile with no hypoxia or tachypnea.  Her lungs are clear and work-up is unremarkable.  She does have a cardiac history, however her troponin is negative and EKG is unchanged.  Patient will be discharged with self-isolation and return if she develops worsening breathing.  Final Clinical Impressions(s) / ED Diagnoses   Final diagnoses:  None    ED Discharge Orders    None       Veryl Speak, MD 09/24/18 [phone]

## 2018-09-24 NOTE — Telephone Encounter (Signed)
Called pt back per dr Shan Levans, ask her to go to [facility] for COVID testing, she was agreeable

## 2018-09-24 NOTE — Discharge Instructions (Addendum)
Continue medications as previously prescribed.      Infection Prevention Recommendations for Individuals Confirmed to have, or Being Evaluated for, 2019 Novel Coronavirus (COVID-19) Infection Who Receive Care at Home  Individuals who are confirmed to have, or are being evaluated for, COVID-19 should follow the prevention steps below until a healthcare provider or local or state health department says they can return to normal activities.  Stay home except to get medical care You should restrict activities outside your home, except for getting medical care. Do not go to work, school, or public areas, and do not use public transportation or taxis.  Call ahead before visiting your doctor Before your medical appointment, call the healthcare provider and tell them that you have, or are being evaluated for, COVID-19 infection. This will help the healthcare providers office take steps to keep other people from getting infected. Ask your healthcare provider to call the local or state health department.  Monitor your symptoms Seek prompt medical attention if your illness is worsening (e.g., difficulty breathing). Before going to your medical appointment, call the healthcare provider and tell them that you have, or are being evaluated for, COVID-19 infection. Ask your healthcare provider to call the local or state health department.  Wear a facemask You should wear a facemask that covers your nose and mouth when you are in the same room with other people and when you visit a healthcare provider. People who live with or visit you should also wear a facemask while they are in the same room with you.  Separate yourself from other people in your home As much as possible, you should stay in a different room from other people in your home. Also, you should use a separate bathroom, if available.  Avoid sharing household items You should not share dishes, drinking glasses, cups, eating utensils,  towels, bedding, or other items with other people in your home. After using these items, you should wash them thoroughly with soap and water.  Cover your coughs and sneezes Cover your mouth and nose with a tissue when you cough or sneeze, or you can cough or sneeze into your sleeve. Throw used tissues in a lined trash can, and immediately wash your hands with soap and water for at least 20 seconds or use an alcohol-based hand rub.  Wash your [facility] your hands often and thoroughly with soap and water for at least 20 seconds. You can use an alcohol-based hand sanitizer if soap and water are not available and if your hands are not visibly dirty. Avoid touching your eyes, nose, and mouth with unwashed hands.   Prevention Steps for Caregivers and Household Members of Individuals Confirmed to have, or Being Evaluated for, COVID-19 Infection Being Cared for in the Home  If you live with, or provide care at home for, a person confirmed to have, or being evaluated for, COVID-19 infection please follow these guidelines to prevent infection:  Follow healthcare providers instructions Make sure that you understand and can help the patient follow any healthcare provider instructions for all care.  Provide for the patients basic needs You should help the patient with basic needs in the home and provide support for getting groceries, prescriptions, and other personal needs.  Monitor the patients symptoms If they are getting sicker, call his or her medical provider and tell them that the patient has, or is being evaluated for, COVID-19 infection. This will help the healthcare providers office take steps to keep other people from getting infected.  Ask the healthcare provider to call the local or state health department.  Limit the number of people who have contact with the patient If possible, have only one caregiver for the patient. Other household members should stay in another home or  place of residence. If this is not possible, they should stay in another room, or be separated from the patient as much as possible. Use a separate bathroom, if available. Restrict visitors who do not have an essential need to be in the home.  Keep older adults, very young children, and other sick people away from the patient Keep older adults, very young children, and those who have compromised immune systems or chronic health conditions away from the patient. This includes people with chronic heart, lung, or kidney conditions, diabetes, and cancer.  Ensure good ventilation Make sure that shared spaces in the home have good air flow, such as from an air conditioner or an opened window, weather permitting.  Wash your hands often Wash your hands often and thoroughly with soap and water for at least 20 seconds. You can use an alcohol based hand sanitizer if soap and water are not available and if your hands are not visibly dirty. Avoid touching your eyes, nose, and mouth with unwashed hands. Use disposable paper towels to dry your hands. If not available, use dedicated cloth towels and replace them when they become wet.  Wear a facemask and gloves Wear a disposable facemask at all times in the room and gloves when you touch or have contact with the patients blood, body fluids, and/or secretions or excretions, such as sweat, saliva, sputum, nasal mucus, vomit, urine, or feces.  Ensure the mask fits over your nose and mouth tightly, and do not touch it during use. Throw out disposable facemasks and gloves after using them. Do not reuse. Wash your hands immediately after removing your facemask and gloves. If your personal clothing becomes contaminated, carefully remove clothing and launder. Wash your hands after handling contaminated clothing. Place all used disposable facemasks, gloves, and other waste in a lined container before disposing them with other household waste. Remove gloves and wash  your hands immediately after handling these items.  Do not share dishes, glasses, or other household items with the patient Avoid sharing household items. You should not share dishes, drinking glasses, cups, eating utensils, towels, bedding, or other items with a patient who is confirmed to have, or being evaluated for, COVID-19 infection. After the person uses these items, you should wash them thoroughly with soap and water.  Wash laundry thoroughly Immediately remove and wash clothes or bedding that have blood, body fluids, and/or secretions or excretions, such as sweat, saliva, sputum, nasal mucus, vomit, urine, or feces, on them. Wear gloves when handling laundry from the patient. Read and follow directions on labels of laundry or clothing items and detergent. In general, wash and dry with the warmest temperatures recommended on the label.  Clean all areas the individual has used often Clean all touchable surfaces, such as counters, tabletops, doorknobs, bathroom fixtures, toilets, phones, keyboards, tablets, and bedside tables, every day. Also, clean any surfaces that may have blood, body fluids, and/or secretions or excretions on them. Wear gloves when cleaning surfaces the patient has come in contact with. Use a diluted bleach solution (e.g., dilute bleach with 1 part bleach and 10 parts water) or a household disinfectant with a label that says EPA-registered for coronaviruses. To make a bleach solution at home, add 1 tablespoon of bleach to  1 quart (4 cups) of water. For a larger supply, add  cup of bleach to 1 gallon (16 cups) of water. Read labels of cleaning products and follow recommendations provided on product labels. Labels contain instructions for safe and effective use of the cleaning product including precautions you should take when applying the product, such as wearing gloves or eye protection and making sure you have good ventilation during use of the product. Remove gloves and  wash hands immediately after cleaning.  Monitor yourself for signs and symptoms of illness Caregivers and household members are considered close contacts, should monitor their health, and will be asked to limit movement outside of the home to the extent possible. Follow the monitoring steps for close contacts listed on the symptom monitoring form.   ? If you have additional questions, contact your local health department or call the epidemiologist on call at [phone] (available 24/7). ? This guidance is subject to change. For the most up-to-date guidance from [facility], please refer to their website: YouBlogs.pl

## 2018-09-24 NOTE — ED Triage Notes (Signed)
Patient c/o fever, NP cough, sore throat, and weakness x 3 days. Patient states her daughter's friends mother was positive for c-19 and the daughter's friend was in the patient's house.

## 2018-09-24 NOTE — Telephone Encounter (Signed)
Discussed with [name] given information would prefer pt get tested given her comorbidities symptoms and the contact. She will require testing at [facility].

## 2018-09-24 NOTE — Telephone Encounter (Signed)
Attending Medicine Reviewed and I agree w rec for COVID-19 testing at [facility].

## 2018-09-24 NOTE — Telephone Encounter (Signed)
NURSING TRIAGE NOTE FOR RESPIRATORY SYMPTOMS  Do you have a fever? " yes, I have taken, 101 orally 2 days ago" "I have taken it since"  Do you have a cough? "1 week, a little phlegm, thick clear, some regular"  Do you have shortness of breath more than normal? " yes, sometimes it is difficult to breathe, it kinda hurts"  Do you have chest pain?"no"  Are you able to eat and drink normally? "yes"  Have you seen a physician for these symptoms?"no"   States" h/a for a couple of days and a sore throat, tiredness,  ive been taking allergy medicine- zyrtec" Daughter and son live in house and are not sick Yolanda Bonine has been there, had a sore throat, nothing else Daughter in law was visiting a week or 2 ago and had been in contact with someone that tested positive, she was showing no signs herself of being sick and is not sick at present  Action: [phone] call back Ask not to be around others for present Wash hands often, keep surfaces clean Drink plenty of fluids Call 911 for severe chest pain, shortness of breath Doctor will call back  I informed patient to expect a phone call from a physician soon and I sent request to front desk to schedule a virtual office appt for patient and arrive the patient.

## 2018-09-28 ENCOUNTER — Other Ambulatory Visit: Payer: Self-pay | Admitting: Neurology

## 2018-10-10 DIAGNOSIS — J4520 Mild intermittent asthma, uncomplicated: Secondary | ICD-10-CM | POA: Diagnosis not present

## 2018-10-10 DIAGNOSIS — E039 Hypothyroidism, unspecified: Secondary | ICD-10-CM | POA: Diagnosis not present

## 2018-10-10 DIAGNOSIS — I10 Essential (primary) hypertension: Secondary | ICD-10-CM | POA: Diagnosis not present

## 2018-10-10 DIAGNOSIS — I2510 Atherosclerotic heart disease of native coronary artery without angina pectoris: Secondary | ICD-10-CM | POA: Diagnosis not present

## 2018-10-22 DIAGNOSIS — F411 Generalized anxiety disorder: Secondary | ICD-10-CM | POA: Diagnosis not present

## 2018-10-22 DIAGNOSIS — F331 Major depressive disorder, recurrent, moderate: Secondary | ICD-10-CM | POA: Diagnosis not present

## 2018-10-22 DIAGNOSIS — F4310 Post-traumatic stress disorder, unspecified: Secondary | ICD-10-CM | POA: Diagnosis not present

## 2018-10-24 ENCOUNTER — Ambulatory Visit: Payer: Medicare Other

## 2018-10-31 ENCOUNTER — Telehealth: Payer: Self-pay | Admitting: Cardiology

## 2018-10-31 DIAGNOSIS — Z9884 Bariatric surgery status: Secondary | ICD-10-CM | POA: Diagnosis not present

## 2018-10-31 IMAGING — CR DG CHEST 2V
2 series · 2 of 2 positions shown · non-contrast
Comparison: Chest radiograph performed 12/19/2013

CLINICAL DATA: Acute onset of generalized chest pain and nausea.
Right arm pain. Initial encounter.

EXAM:
CHEST  2 VIEW

[chest pa]
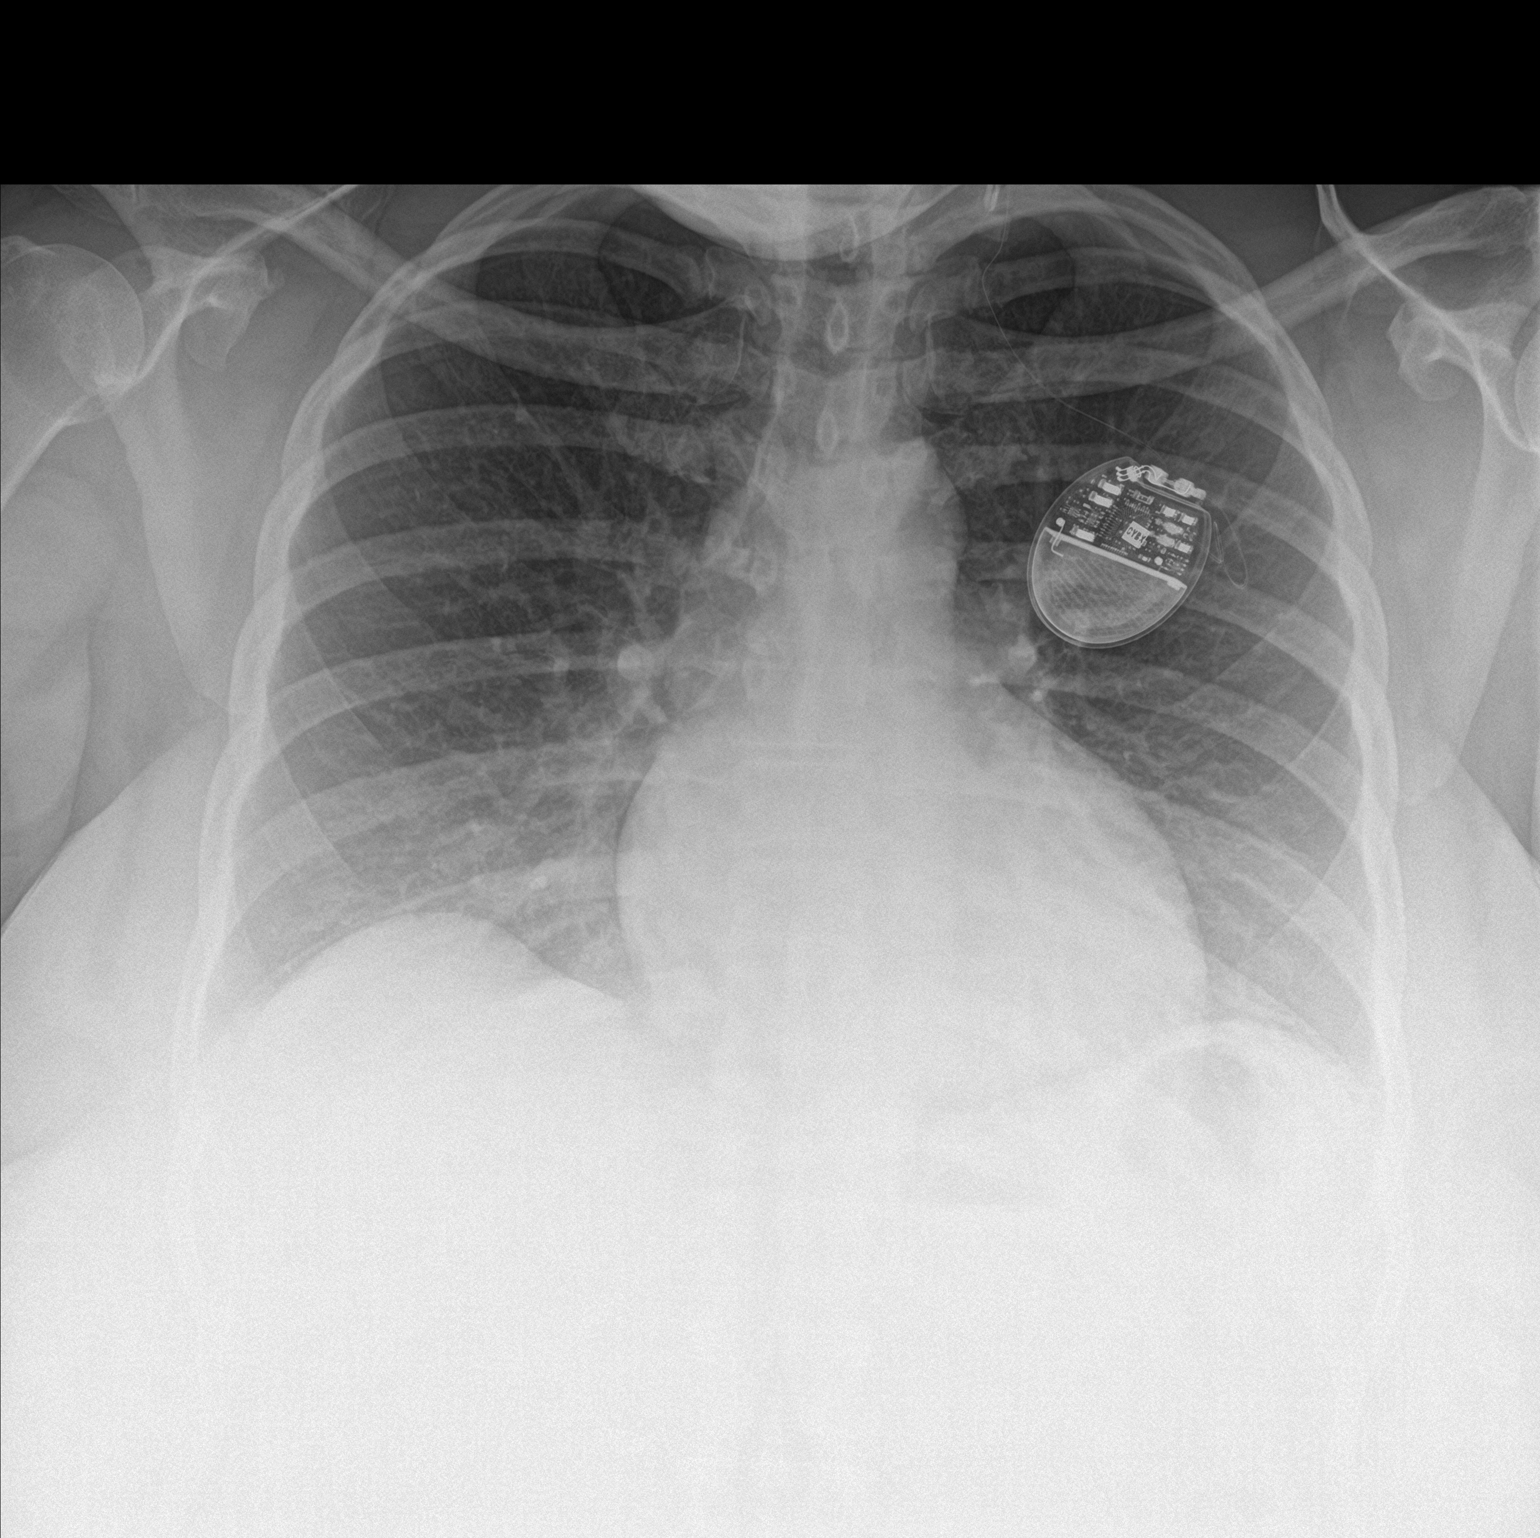

[chest lat]
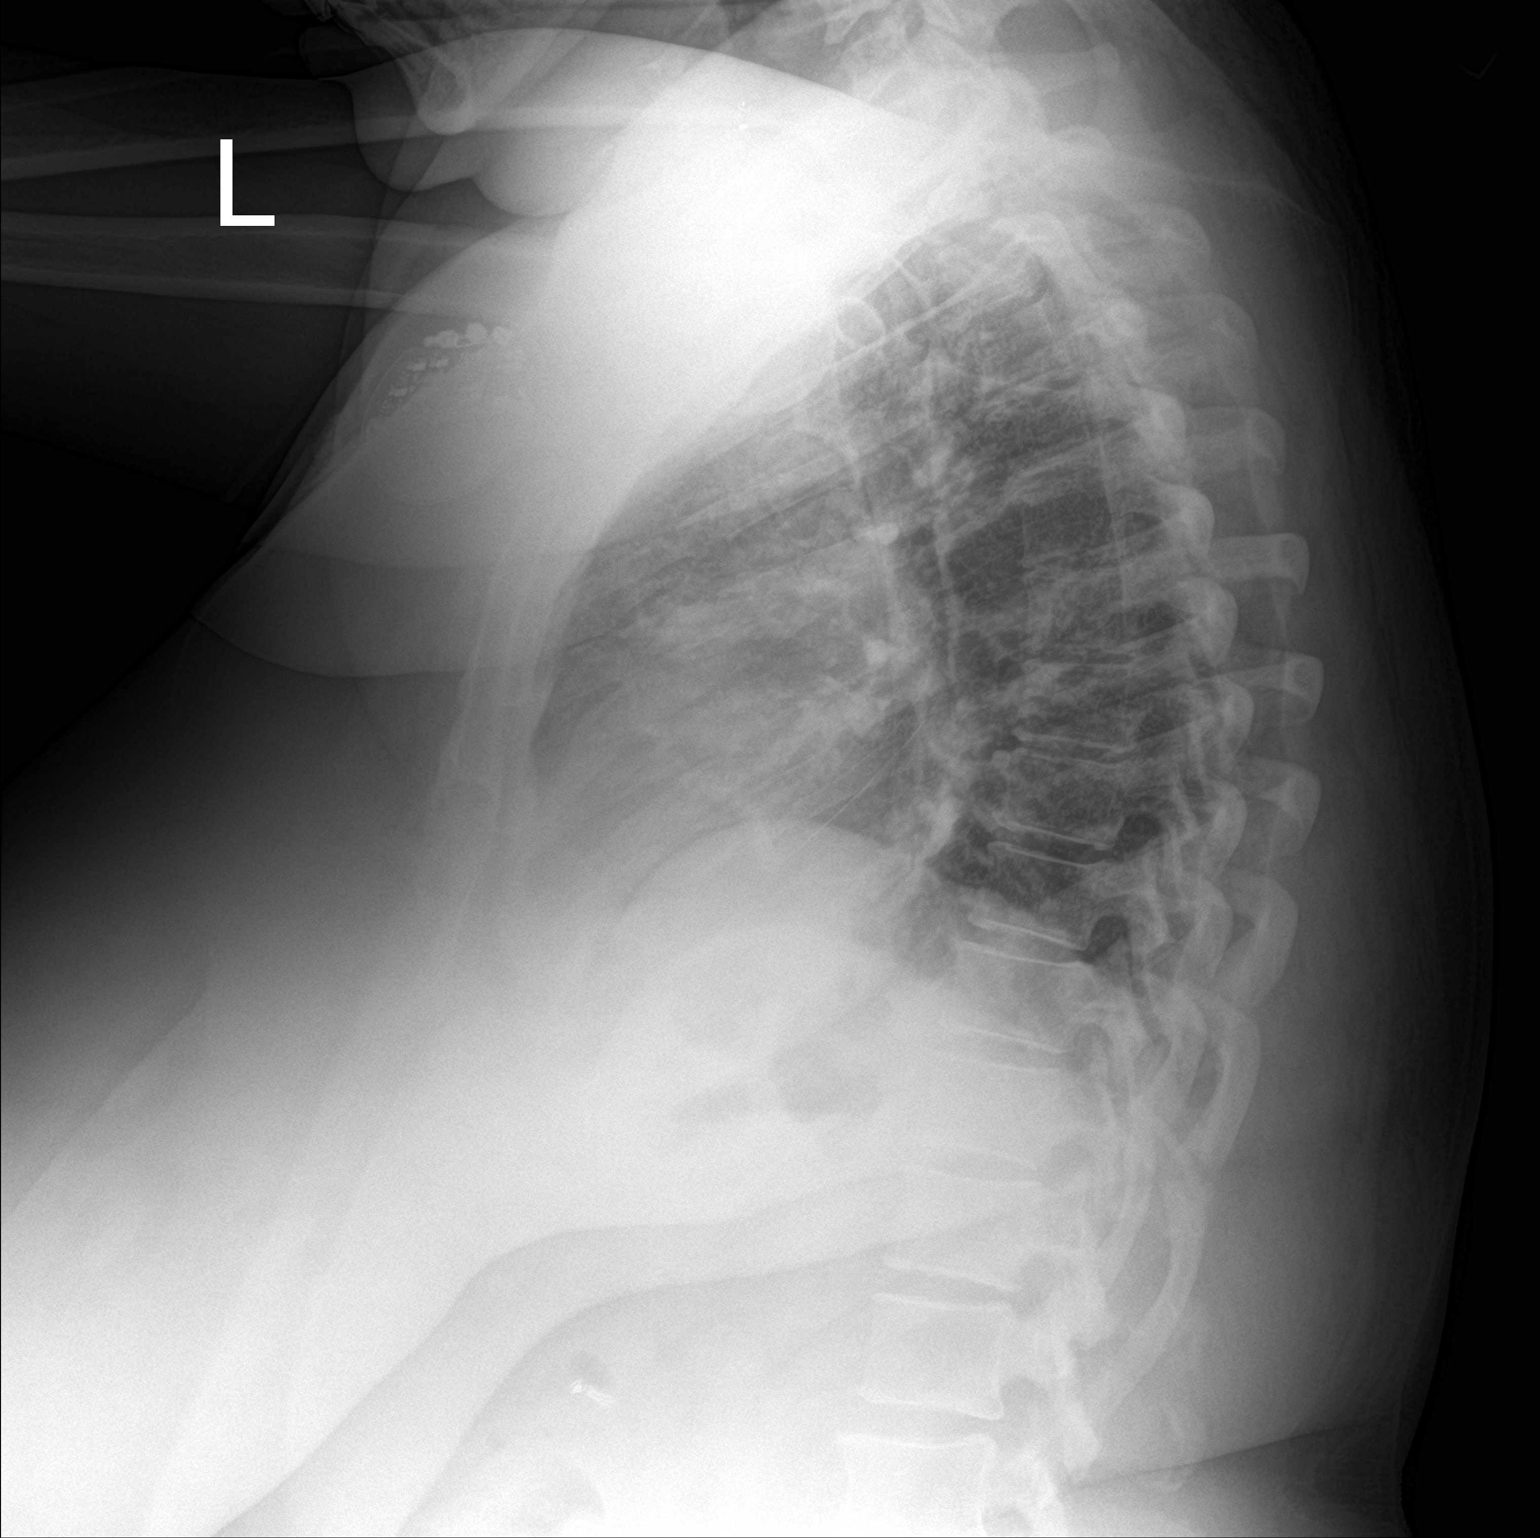

[2 of 2 positions shown; findings below may reference images not displayed]

FINDINGS: The lungs are well-aerated and clear. There is no evidence of focal
opacification, pleural effusion or pneumothorax.

The heart is normal in size; the mediastinal contour is within
normal limits. No acute osseous abnormalities are seen. A metallic
device is noted overlying the left chest wall. Clips are noted
within the right upper quadrant, reflecting prior cholecystectomy.
IMPRESSION: No acute cardiopulmonary process seen.

## 2018-10-31 NOTE — Telephone Encounter (Signed)
I spoke to the patient and informed her that the Verapamil has been discontinued and she no longer needs that medication on hand.  She verbalized understanding and was thankful for the call.

## 2018-10-31 NOTE — Telephone Encounter (Signed)
New Message           Patient is calling in today to ask about "Verapamil" ER 240". Patient is not sure if she is suppose to continue this medication or not. Pls call to advise.

## 2018-11-07 NOTE — Addendum Note (Signed)
Addended by: Hulan Fray on: 11/07/2018 06:31 PM   Modules accepted: Orders

## 2018-11-20 ENCOUNTER — Telehealth: Payer: Self-pay | Admitting: *Deleted

## 2018-11-20 ENCOUNTER — Ambulatory Visit (INDEPENDENT_AMBULATORY_CARE_PROVIDER_SITE_OTHER): Payer: Medicare Other | Admitting: Internal Medicine

## 2018-11-20 ENCOUNTER — Other Ambulatory Visit: Payer: Self-pay

## 2018-11-20 DIAGNOSIS — M8949 Other hypertrophic osteoarthropathy, multiple sites: Secondary | ICD-10-CM

## 2018-11-20 DIAGNOSIS — M79642 Pain in left hand: Secondary | ICD-10-CM | POA: Diagnosis not present

## 2018-11-20 DIAGNOSIS — M79641 Pain in right hand: Secondary | ICD-10-CM

## 2018-11-20 DIAGNOSIS — M159 Polyosteoarthritis, unspecified: Secondary | ICD-10-CM

## 2018-11-20 MED ORDER — DICLOFENAC SODIUM 1 % TD GEL: 4.0000 g | Freq: Four times a day (QID) | TRANSDERMAL | 1 refills | Status: DC

## 2018-11-20 NOTE — Telephone Encounter (Addendum)
Information was sen through CoverMyMeds fior [place] for Diclofenac Gel 1% .  Determination from [facility] within 72 hours.  Sander Nephew, RN 11/20/2018 4:42 PM.  PA for Diclofenac Gel 1% approved 11/20/2018 thru 11/20/2019.  [facility] called and informed of.  Sander Nephew, RN 11/21/2018 9:23 AM.

## 2018-11-20 NOTE — Patient Instructions (Signed)
Ms. Wos, It was a pleasure meeting you! Today we discussed your joint pain which is likely due to osteoarthritis. The best medications for this are any NSAID you can by over-the-counter such as Ibuprofen, Advil or Aleve. Try taking it every day for 2 weeks to calm the inflammation down and then as needed after that. You can try icing as well after you've done any activities for the day.  I'll also send in a topical anti-inflammatory gel you can apply to your joints.   Please return in 4-6 weeks if you have not noticed any improvement.   Take care! Dr. Koleen Distance

## 2018-11-20 NOTE — Progress Notes (Signed)
   CC: hand pain   HPI:  [name] is a 53 y.o. female with PMHx listed below who presents with 3 weeks of progressively worsening bilateral hand pain. Described as constant ache that is worse with movement. She notes she has been doing more lifting and activities around the house when she first noticed the pain come on. She has noticed her grip feels weaker. Denies fevers, chills, rashes, numbness/tingling, recent injury, or neck pain.   Past Medical History:  Diagnosis Date  . Asthma   . Biliary dyskinesia    a. s/p cholecystectomy.  . 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 Abnl stress [place] 2008, f/u cath reportedly nl. b. ETT-Myoview 04/2011 - EKG changes but normal perfusion. Cor CT - no coronary calcium, no definite stenosis though mRCA not fully evaluated. c. 10/2011 - tn elevated in [place], LHC without CAD. Started on Ranexa, anti-anginals ?microvascular dz but later stopped while in hospital on abx.  . Complication of anesthesia    hard to wake up-had to be reminded to breath  . GERD (gastroesophageal reflux disease)    a. Severe.  Marland Kitchen HTN (hypertension)   . Hx of cardiovascular stress test    [facility] Myoview 8/14:  Normal, EF 74%  . Hx of echocardiogram    Echo 3/16:  Mild LVH, EF 55-60%, Gr 1 DD, trivial MR, mild LAE, normal RVF  . Hypothyroid   . MI (myocardial infarction) ([place])   . Migraine   . Morbid obesity ([place]) 07/26/2017  . MRSA infection    a. After vagal nerve stimulator at [facility] - surgical site MRSA infection, PICC placed.  . Obesity   . OSA (obstructive sleep apnea) 07/26/2017  . Palpitations    a. 08/2014: 48 hour holter with 2 PVCs otherwise normal.  . Seizure disorder ([place])    a. since childhood. b. s/p vagal nerve stimulator at [facility].  . Seizures ([place])    Review of Systems:  Review of Systems  All other systems reviewed and are negative.   Physical Exam:  Vitals:   11/20/18 1049  BP: (!) 141/94  Pulse: 81  Temp: 98 F  (36.7 C)  TempSrc: Oral  SpO2: 100%  Weight: 177 lb (80.3 kg)  Height: 5' 5.5" (1.664 m)   Physical Exam Constitutional:      General: She is not in acute distress.    Appearance: Normal appearance.  Musculoskeletal:     Right wrist: She exhibits decreased range of motion and bony tenderness. She exhibits no effusion.     Left wrist: She exhibits decreased range of motion and bony tenderness. She exhibits no effusion.     Comments: Bilateral wrists with painful ROM, most notably with ulnar deviation. Negative Tinel's sign. No thenar atrophy.   Skin:    General: Skin is warm and dry.  Neurological:     Mental Status: She is alert.  Psychiatric:        Mood and Affect: Mood normal.        Behavior: Behavior normal.     Assessment & Plan:   See Encounters Tab for problem based charting.  Patient discussed with Dr. Angelia Mould

## 2018-11-21 NOTE — Telephone Encounter (Signed)
rtc from insurance, approval 11/20/2018 for 1 year of diclofenac gel 1% just rec'd a call from [employer]

## 2018-11-24 ENCOUNTER — Encounter: Payer: Self-pay | Admitting: Internal Medicine

## 2018-11-24 DIAGNOSIS — M1990 Unspecified osteoarthritis, unspecified site: Secondary | ICD-10-CM | POA: Insufficient documentation

## 2018-11-24 NOTE — Assessment & Plan Note (Signed)
Patient's symptoms and physical exam findings most consistent with OA. Flare likely in the setting of increase in activity above baseline.  Advised her to try scheduled NSAIDs and ice as needed. Will also prescribe topical Diclofenac.  Instructed to return in 4-6 weeks if symptoms have not improved.

## 2018-11-25 DIAGNOSIS — Z20828 Contact with and (suspected) exposure to other viral communicable diseases: Secondary | ICD-10-CM | POA: Diagnosis not present

## 2018-11-25 NOTE — Progress Notes (Signed)
Internal Medicine Clinic Attending  Case discussed with Dr. Bloomfield at the time of the visit.  We reviewed the resident's history and exam and pertinent patient test results.  I agree with the assessment, diagnosis, and plan of care documented in the resident's note.  

## 2018-11-26 DIAGNOSIS — G4733 Obstructive sleep apnea (adult) (pediatric): Secondary | ICD-10-CM | POA: Diagnosis not present

## 2018-11-29 ENCOUNTER — Other Ambulatory Visit: Payer: Self-pay | Admitting: Internal Medicine

## 2018-11-29 ENCOUNTER — Other Ambulatory Visit: Payer: Self-pay | Admitting: Cardiology

## 2018-11-29 DIAGNOSIS — M5416 Radiculopathy, lumbar region: Secondary | ICD-10-CM

## 2018-11-30 NOTE — Telephone Encounter (Signed)
refilled 

## 2018-12-03 DIAGNOSIS — Z713 Dietary counseling and surveillance: Secondary | ICD-10-CM | POA: Diagnosis not present

## 2018-12-03 DIAGNOSIS — Z9884 Bariatric surgery status: Secondary | ICD-10-CM | POA: Diagnosis not present

## 2018-12-12 ENCOUNTER — Encounter: Payer: Self-pay | Admitting: Family Medicine

## 2018-12-12 ENCOUNTER — Other Ambulatory Visit: Payer: Self-pay

## 2018-12-12 ENCOUNTER — Ambulatory Visit (INDEPENDENT_AMBULATORY_CARE_PROVIDER_SITE_OTHER): Payer: Medicare Other | Admitting: Family Medicine

## 2018-12-12 ENCOUNTER — Other Ambulatory Visit (HOSPITAL_COMMUNITY)
Admission: RE | Admit: 2018-12-12 | Discharge: 2018-12-12 | Disposition: A | Discharge status: Home / Self Care | Payer: Medicare Other | Source: Ambulatory Visit | Attending: Family Medicine | Admitting: Family Medicine

## 2018-12-12 VITALS — BP 131/98 | HR 80 | Ht 65.5 in | Wt 173.3 lb

## 2018-12-12 DIAGNOSIS — Z01419 Encounter for gynecological examination (general) (routine) without abnormal findings: Secondary | ICD-10-CM | POA: Insufficient documentation

## 2018-12-12 DIAGNOSIS — N941 Unspecified dyspareunia: Secondary | ICD-10-CM

## 2018-12-12 NOTE — Patient Instructions (Signed)
Try a water-based lubricant. Return in 1-2 months if no improvement.  Dyspareunia, Female Dyspareunia is pain that is associated with sexual activity. This can affect any part of the genitals or lower abdomen. There are many possible causes of this condition. In some cases, diagnosing the cause of dyspareunia can be difficult. This condition can be mild, moderate, or severe. Depending on the cause, dyspareunia may get better with treatment, but may return (recur) over time. What are the causes?  The cause of this condition is not always known. However, problems that affect the vulva, vagina, uterus, and other organs may cause dyspareunia. Common causes of this condition include:  Vaginal dryness.  Giving birth.  Infection.  Skin changes or conditions.  Side effects of medicines.  Endometriosis. This is when tissue that is like the lining of the uterus grows on the outside of the uterus.  Psychological conditions. These include depression, anxiety, or traumatic experiences.  Allergic reaction. What increases the risk? The following factors may make you more likely to develop this condition:  History of physical or sexual trauma.  Some medicines.  No longer having a monthly period (menopause).  Having recently given birth.  Taking baths using soaps that have perfumes. These can cause irritation.  Douching. What are the signs or symptoms? The main symptom of this condition is pain in any part of your genitals or lower abdomen during or after sex. This may include:  Irritation, burning, or stinging sensations in your vulva.  Discomfort when your vulva or surrounding area is touched.  Aching and throbbing pain that may be constant.  Pain that gets worse when something is inserted into your vagina. How is this diagnosed? This condition may be diagnosed based on:  Your symptoms, including where and when your pain occurs.  Your medical history.  A physical exam. A  pelvic exam will most likely be done.  Tests that include ultrasound, blood tests, and tests that check the body for infection.  Imaging tests, such as X-ray, MRI, and CT scan. You may be referred to a health care provider who specializes in women's health (gynecologist). How is this treated? Treatment depends on the cause of your condition and your symptoms. In most cases, you may need to stop sexual activity until your symptoms go away or get better. Treatment may include:  Lubricants, ointments, and creams.  Physical therapy.  Massage therapy.  Hormonal therapy.  Medicines to: ? Prevent or fight infection. ? Relieve pain. ? Help numb the area. ? Treat depression (antidepressants).  Counseling, which may include sex therapy.  Surgery. Follow these instructions at home: Lifestyle  Wear cotton underwear.  Use water-based lubricants as needed during sex. Avoid oil-based lubricants.  Do not use any products that can cause irritation. This may include certain condoms, spermicides, lubricants, soaps, tampons, vaginal sprays, or douches.  Always practice safe sex. Use a condom to prevent sexually transmitted infections (STIs).  Talk freely with your partner about your condition. General instructions  Take or apply over-the-counter and prescription medicines only as told by your health care provider.  Urinate before you have sex.  Consider joining a support group.  Get the results of any tests you have done. Ask your health care provider, or the department that is doing the procedure, when your results will be ready.  Keep all follow-up visits as told by your health care provider. This is important. Contact a health care provider if:  You have vaginal bleeding after having sex.  You  develop a lump at the opening of your vagina even if the lump is painless.  You have: ? Abnormal discharge from your vagina. ? Vaginal dryness. ? Itchiness or irritation of your vulva  or vagina. ? A new rash. ? Symptoms that get worse or do not improve with treatment. ? A fever. ? Pain when you urinate. ? Blood in your urine. Get help right away if:  You have severe pain in your abdomen during or shortly after sex.  You pass out after sex. Summary  Dyspareunia is pain that is associated with sexual activity. This can affect any part of the genitals or lower abdomen.  There are many causes of this condition. Treatment depends on the cause and your symptoms. In most cases, you may need to stop sexual activity until your symptoms improve.  Take or apply over-the-counter and prescription medicines only as told by your health care provider.  Contact a health care provider if your symptoms get worse or do not improve with treatment.  Keep all follow-up visits as told by your health care provider. This is important. This information is not intended to replace advice given to you by your health care provider. Make sure you discuss any questions you have with your health care provider. Document Released: 06/11/2007 Document Revised: 07/29/2018 Document Reviewed: 07/29/2018 Elsevier Patient Education  2020 [employer].

## 2018-12-12 NOTE — Progress Notes (Signed)
New patient is in the office for annual. Last pap 01-15-17 Pt reports last mammogram 2019. Pt reports pelvic cramping and painful intercourse, desires std testing. GAD-7= 11

## 2018-12-12 NOTE — Progress Notes (Signed)
GYNECOLOGY OFFICE VISIT NOTE History:  53 y.o. [phone] with PMH of GHTN, chronic diastolic heart failure, OSA, GERD, hypothyroidism, obesity here today for well-woman visit.   - irregular cycles, last in November, thinks maybe going through menopause - has noticed some hot flashes - painful intercourse - new partner since February, out of 15-year relationship, found out they had been sleeping with other people - doesn't have as much desire, noticed some dryness, has not had any spotting with sex, no abnormal discharge - wants STI testing  - last pap smear 8/18 - was told normal but should be repeated because has a polyp? - records from [place] reviewed but actual path report not available - sees PCP for routine medical care - takes anxiety medication, recent weight loss surgery - has lost about 150lbs since surgery last year - feeling much better!   The following portions of the patient's history were reviewed and updated as appropriate: allergies, current medications, past family history, past medical history, past social history, past surgical history and problem list.   Review of Systems:  Pertinent items noted in HPI Review of Systems  Constitutional: Positive for weight loss (intentional with surgery). Negative for chills, fever and malaise/fatigue.       Hot flashes occasionally  HENT: Negative for congestion and sore throat.   Respiratory: Negative for cough and shortness of breath.   Cardiovascular: Negative for chest pain.  Gastrointestinal: Negative for abdominal pain.  Genitourinary: Negative for dysuria and frequency.  Musculoskeletal: Negative for back pain and myalgias.  Neurological: Negative for dizziness and headaches.  Psychiatric/Behavioral: Negative for depression. The patient has insomnia. The patient is not nervous/anxious.    Objective:  Physical Exam BP (!) 131/98   Pulse 80   Ht 5' 5.5" (1.664 m)   Wt 173 lb 4.8 oz (78.6 kg)   LMP 04/13/2018    BMI 28.40 kg/m  Physical Exam  Constitutional: She is oriented to person, place, and time. She appears well-developed and well-nourished. No distress.  HENT:  Head: Normocephalic and atraumatic.  Eyes: Conjunctivae and EOM are normal.  Neck: Neck supple. No thyromegaly present.  Cardiovascular: Normal rate.  Pulmonary/Chest: Effort normal. No respiratory distress.  Abdominal: Soft. There is no abdominal tenderness.  Genitourinary:    Genitourinary Comments: Normal external genitalia  vaginal mucosa with some loss of ruggae, scant discharge  cervix with small (1/2cm), erythematous polyp at 6 o'clock position - not friable, cervix otherwise without lesions  no CMT   Lymphadenopathy:    She has no cervical adenopathy.  Neurological: She is alert and oriented to person, place, and time.  Psychiatric: She has a normal mood and affect. Her behavior is normal.  Nursing note and vitals reviewed.  Labs and Imaging No results found for this or any previous visit (from the past 168 hour(s)). No results found.  Assessment & Plan:  51IR [id number] here for well-woman visit.   Well-Woman Visit -- Routine preventative health maintenance measures emphasized. -- Please refer to After Visit Summary for other counseling recommendations.  -- congratulated on weight loss, advised to continue current efforts -- Pap smear collected, routine STI screening -- follow-up with PCP for other medical issues  -- referral for screening mammogram   Dyspareunia  -- discussed likely multifactorial etiology -- advised to trial water-based lubricants and could consider topical estrogen if no improvement (risks/benefits given history)  Return in about 2 months (around 02/12/2019), or if symptoms worsen or fail to improve.  Total face-to-face  time with patient: 25 minutes.  Over 50% of encounter was spent on counseling and coordination of care.  Lambert Mody. Juleen China, DO OB Family Medicine Fellow, [facility] for [employer], [place]

## 2018-12-13 LAB — CERVICOVAGINAL ANCILLARY ONLY
Bacterial vaginitis: POSITIVE — AB
Candida vaginitis: NEGATIVE
Chlamydia: NEGATIVE
Neisseria Gonorrhea: NEGATIVE
Trichomonas: NEGATIVE

## 2018-12-13 LAB — HEPATITIS B SURFACE ANTIGEN: Hepatitis B Surface Ag: NEGATIVE

## 2018-12-13 LAB — HIV ANTIBODY (ROUTINE TESTING W REFLEX): HIV Screen 4th Generation wRfx: NONREACTIVE

## 2018-12-13 LAB — RPR: RPR Ser Ql: NONREACTIVE

## 2018-12-13 LAB — HEPATITIS C ANTIBODY: Hep C Virus Ab: <0.1 s/co ratio (ref 0.0–0.9)

## 2018-12-14 ENCOUNTER — Other Ambulatory Visit: Payer: Self-pay

## 2018-12-14 ENCOUNTER — Encounter (HOSPITAL_COMMUNITY): Payer: Self-pay

## 2018-12-14 ENCOUNTER — Ambulatory Visit (HOSPITAL_COMMUNITY)
Admission: EM | Admit: 2018-12-14 | Discharge: 2018-12-14 | Disposition: A | Discharge status: Home / Self Care | Payer: Medicare Other | Attending: Internal Medicine | Admitting: Internal Medicine

## 2018-12-14 DIAGNOSIS — I10 Essential (primary) hypertension: Secondary | ICD-10-CM | POA: Diagnosis not present

## 2018-12-14 DIAGNOSIS — M7918 Myalgia, other site: Secondary | ICD-10-CM

## 2018-12-14 MED ORDER — NAPROXEN 375 MG PO TABS: 375.0000 mg | ORAL_TABLET | Freq: Two times a day (BID) | ORAL | 0 refills | Status: DC

## 2018-12-14 MED ORDER — BACLOFEN 10 MG PO TABS: 10.0000 mg | ORAL_TABLET | Freq: Three times a day (TID) | ORAL | 0 refills | Status: DC
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
## 2018-12-17 LAB — CYTOLOGY - PAP
Diagnosis: UNDETERMINED — AB
HPV: NOT DETECTED

## 2018-12-19 ENCOUNTER — Ambulatory Visit (HOSPITAL_COMMUNITY)
Admission: EM | Admit: 2018-12-19 | Discharge: 2018-12-19 | Disposition: A | Discharge status: Home / Self Care | Payer: Medicare Other | Attending: Internal Medicine | Admitting: Internal Medicine

## 2018-12-19 ENCOUNTER — Other Ambulatory Visit: Payer: Self-pay

## 2018-12-19 ENCOUNTER — Encounter (HOSPITAL_COMMUNITY): Payer: Self-pay | Admitting: Emergency Medicine

## 2018-12-19 ENCOUNTER — Telehealth: Payer: Self-pay | Admitting: *Deleted

## 2018-12-19 DIAGNOSIS — M7918 Myalgia, other site: Secondary | ICD-10-CM | POA: Diagnosis not present

## 2018-12-19 MED ORDER — KETOROLAC TROMETHAMINE 60 MG/2ML IM SOLN
60.0000 mg | Freq: Once | INTRAMUSCULAR | Status: AC
  Administered 2018-12-19: 60 mg via INTRAMUSCULAR

## 2018-12-19 MED ORDER — KETOROLAC TROMETHAMINE 60 MG/2ML IM SOLN
INTRAMUSCULAR | Status: AC
  Filled 2018-12-19: qty 2

## 2018-12-19 NOTE — Telephone Encounter (Signed)
Sounds reasonable. Lots going on, needs in person eval to sort it out.
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
## 2018-12-23 ENCOUNTER — Telehealth: Payer: Self-pay

## 2018-12-23 MED ORDER — METRONIDAZOLE 500 MG PO TABS: 500.0000 mg | ORAL_TABLET | Freq: Two times a day (BID) | ORAL | 0 refills | Status: DC

## 2018-12-23 NOTE — Telephone Encounter (Signed)
Returned call, advised of results and rx sent to pharmacy per provider order.

## 2018-12-25 ENCOUNTER — Encounter: Payer: Self-pay | Admitting: Family Medicine

## 2018-12-25 DIAGNOSIS — R87610 Atypical squamous cells of undetermined significance on cytologic smear of cervix (ASC-US): Secondary | ICD-10-CM | POA: Insufficient documentation

## 2018-12-26 ENCOUNTER — Telehealth: Payer: Self-pay | Admitting: *Deleted

## 2018-12-26 NOTE — Telephone Encounter (Signed)
Pt calls and states she would like to be seen fri 7/24 for itching, whelp type rash, denies mouth, lips, tongue swelling. Denies short of breath, chest pain

## 2018-12-27 ENCOUNTER — Telehealth: Payer: Self-pay | Admitting: *Deleted

## 2018-12-27 ENCOUNTER — Other Ambulatory Visit: Payer: Self-pay

## 2018-12-27 ENCOUNTER — Ambulatory Visit (INDEPENDENT_AMBULATORY_CARE_PROVIDER_SITE_OTHER): Payer: Medicare Other | Admitting: Internal Medicine

## 2018-12-27 ENCOUNTER — Encounter: Payer: Self-pay | Admitting: Internal Medicine

## 2018-12-27 VITALS — BP 145/104 | HR 84 | Temp 98.3°F | Ht 65.5 in | Wt 174.5 lb

## 2018-12-27 DIAGNOSIS — I110 Hypertensive heart disease with heart failure: Secondary | ICD-10-CM | POA: Diagnosis not present

## 2018-12-27 DIAGNOSIS — L255 Unspecified contact dermatitis due to plants, except food: Secondary | ICD-10-CM | POA: Diagnosis not present

## 2018-12-27 DIAGNOSIS — Z9682 Presence of neurostimulator: Secondary | ICD-10-CM

## 2018-12-27 DIAGNOSIS — G40909 Epilepsy, unspecified, not intractable, without status epilepticus: Secondary | ICD-10-CM

## 2018-12-27 DIAGNOSIS — L241 Irritant contact dermatitis due to oils and greases: Secondary | ICD-10-CM

## 2018-12-27 DIAGNOSIS — G8929 Other chronic pain: Secondary | ICD-10-CM

## 2018-12-27 DIAGNOSIS — J45909 Unspecified asthma, uncomplicated: Secondary | ICD-10-CM

## 2018-12-27 DIAGNOSIS — L299 Pruritus, unspecified: Secondary | ICD-10-CM

## 2018-12-27 DIAGNOSIS — I5032 Chronic diastolic (congestive) heart failure: Secondary | ICD-10-CM

## 2018-12-27 DIAGNOSIS — E039 Hypothyroidism, unspecified: Secondary | ICD-10-CM

## 2018-12-27 MED ORDER — TRIAMCINOLONE ACETONIDE 0.1 % EX CREA: 1.0000 "application " | TOPICAL_CREAM | Freq: Two times a day (BID) | CUTANEOUS | 0 refills | Status: DC

## 2018-12-27 MED ORDER — TRAMADOL HCL 50 MG PO TABS: 50.0000 mg | ORAL_TABLET | Freq: Two times a day (BID) | ORAL | 0 refills | Status: AC | PRN

## 2018-12-27 NOTE — Telephone Encounter (Signed)
Contacted Dr Evette Doffing and he is putting in the order. If she calls again, please pass on my apologies!
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
## 2018-12-27 NOTE — Telephone Encounter (Signed)
Pt calls and is somewhat disgruntled, she went to pharmacy and tramadol was not there and ready, called dr Darrick Meigs, she will have dr Evette Doffing send the script

## 2018-12-27 NOTE — Addendum Note (Signed)
Addended by: Lalla Brothers T on: 12/27/2018 02:42 PM   Modules accepted: Orders

## 2018-12-27 NOTE — Assessment & Plan Note (Signed)
Patient presents today with a two-week history of extremely pruritic rash.  Patient notes that this occurred shortly after having come in contact with poison oak.  Since that time she has been trying over-the-counter hydrocortisone cream and Benadryl with minimal relief.  Plan: Triamcinolone topical cream.  Encouraged her to try to cease the itching.  Can consider a more systemic approach with the use of some p.o. prednisone if symptoms do continue

## 2018-12-27 NOTE — Progress Notes (Signed)
Internal Medicine Clinic Attending  I saw and evaluated the patient.  I personally confirmed the key portions of the history and exam documented by Dr. Christian   and I reviewed pertinent patient test results.  The assessment, diagnosis, and plan were formulated together and I agree with the documentation in the resident's note.  

## 2018-12-27 NOTE — Patient Instructions (Signed)
The itch is likely a reaction to the poision oak. We are going to prescribe you a steroid cream that is a bit stronger than the one you have been using. Additionally, for your pain, we will send tramadol into your pharmacy. Please see your PCP so he can help you manage this on a longer term basis.
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
## 2018-12-31 DIAGNOSIS — H524 Presbyopia: Secondary | ICD-10-CM | POA: Diagnosis not present

## 2019-01-14 ENCOUNTER — Other Ambulatory Visit: Payer: Self-pay | Admitting: Internal Medicine

## 2019-01-20 ENCOUNTER — Encounter

## 2019-02-03 ENCOUNTER — Ambulatory Visit
Admission: RE | Admit: 2019-02-03 | Discharge: 2019-02-03 | Disposition: A | Discharge status: Home / Self Care | Payer: Medicare Other | Source: Ambulatory Visit | Attending: Family Medicine | Admitting: Family Medicine

## 2019-02-03 ENCOUNTER — Other Ambulatory Visit: Payer: Self-pay

## 2019-02-03 DIAGNOSIS — Z1231 Encounter for screening mammogram for malignant neoplasm of breast: Secondary | ICD-10-CM | POA: Diagnosis not present

## 2019-02-03 DIAGNOSIS — Z01419 Encounter for gynecological examination (general) (routine) without abnormal findings: Secondary | ICD-10-CM

## 2019-02-05 ENCOUNTER — Other Ambulatory Visit: Payer: Self-pay

## 2019-02-05 ENCOUNTER — Ambulatory Visit (INDEPENDENT_AMBULATORY_CARE_PROVIDER_SITE_OTHER): Payer: Medicare Other | Admitting: Internal Medicine

## 2019-02-05 ENCOUNTER — Encounter: Payer: Self-pay | Admitting: Internal Medicine

## 2019-02-05 VITALS — BP 113/66 | HR 77 | Temp 98.1°F | Ht 65.5 in | Wt 174.7 lb

## 2019-02-05 DIAGNOSIS — M79604 Pain in right leg: Secondary | ICD-10-CM

## 2019-02-05 DIAGNOSIS — G5603 Carpal tunnel syndrome, bilateral upper limbs: Secondary | ICD-10-CM

## 2019-02-05 DIAGNOSIS — M79641 Pain in right hand: Secondary | ICD-10-CM | POA: Diagnosis not present

## 2019-02-05 DIAGNOSIS — M549 Dorsalgia, unspecified: Secondary | ICD-10-CM

## 2019-02-05 DIAGNOSIS — M79642 Pain in left hand: Secondary | ICD-10-CM

## 2019-02-05 DIAGNOSIS — M25532 Pain in left wrist: Secondary | ICD-10-CM | POA: Diagnosis not present

## 2019-02-05 DIAGNOSIS — M25531 Pain in right wrist: Secondary | ICD-10-CM

## 2019-02-05 DIAGNOSIS — M79605 Pain in left leg: Secondary | ICD-10-CM

## 2019-02-05 DIAGNOSIS — M542 Cervicalgia: Secondary | ICD-10-CM

## 2019-02-05 MED ORDER — GABAPENTIN 100 MG PO CAPS: 100.0000 mg | ORAL_CAPSULE | Freq: Three times a day (TID) | ORAL | 0 refills | Status: DC | PRN

## 2019-02-05 MED ORDER — LIDOCAINE-PRILOCAINE 2.5-2.5 % EX CREA: 1.0000 "application " | TOPICAL_CREAM | CUTANEOUS | 0 refills | Status: DC | PRN

## 2019-02-05 NOTE — Patient Instructions (Addendum)
We will try a new medication called gabapentin for your pain and discomfort.  I will check some lab work and get some x-rays to evaluate your bilateral hand and arm pain.  Please purchase another brace and start wearing the brace you have.  I encourage you to follow up with Dr. Krista Blue as well as we discussed.

## 2019-02-05 NOTE — Progress Notes (Signed)
CC: Bilateral hand pain  HPI:  [name] is a 53 y.o. female with PMH below.  Today we will address Bilateral hand pain  Please see A&P for status of the patient's chronic medical conditions  Past Medical History:  Diagnosis Date  . Asthma   . Biliary dyskinesia    a. s/p cholecystectomy.  Marland Kitchen BMI 40.0-44.9, adult ([place]) 06/11/2015  . 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 Abnl stress [place] 2008, f/u cath reportedly nl. b. ETT-Myoview 04/2011 - EKG changes but normal perfusion. Cor CT - no coronary calcium, no definite stenosis though mRCA not fully evaluated. c. 10/2011 - [place], LHC without CAD. Started on Ranexa, anti-anginals ?microvascular dz but later stopped while in hospital on abx.  . Complication of anesthesia    hard to wake up-had to be reminded to breath  . Diarrhea 08/09/2017  . GERD (gastroesophageal reflux disease)    a. Severe.  Marland Kitchen History of seizure 10/19/2017  . HTN (hypertension)   . Hx of cardiovascular stress test    Lex Myoview 8/14:  Normal, EF 74%  . Hx of echocardiogram    Echo 3/16:  Mild LVH, EF 55-60%, Gr 1 DD, trivial MR, mild LAE, normal RVF  . Hydradenitis 11/09/2016  . Hypothyroid   . Hypothyroidism 07/02/2013   Overview:  Last Assessment & Plan:   She reports a 21 pound increase in her weight since October. She doesn't know when the last time her TSH was checked so we will do that today.  . Low back pain 04/19/2016  . MI (myocardial infarction) ([employer])   . Migraine   . Morbid obesity ([place]) 07/26/2017  . MRSA infection    a. After vagal nerve stimulator at [facility] - surgical site MRSA infection, PICC placed.  . Obesity   . OSA (obstructive sleep apnea) 07/26/2017  . Palpitations    a. 08/2014: 48 hour holter with 2 PVCs otherwise normal.  . Rectal pain 12/19/2017  . Seizure disorder ([place])    a. since childhood. b. s/p vagal nerve stimulator at [facility].  . Seizures ([place])    Review of Systems:  ROS: Pulmonary:  pt denies increased work of breathing, shortness of breath,  Cardiac: pt denies palpitations, chest pain,  Abdominal: pt denies abdominal pain, nausea, vomiting, or diarrhea  Physical Exam:  Vitals:   02/05/19 1523  BP: 113/66  Pulse: 77  Temp: 98.1 F (36.7 C)  TempSrc: Oral  SpO2: 100%  Weight: 174 lb 11.2 oz (79.2 kg)  Height: 5' 5.5" (1.664 m)   Cardiac: JVD flat, normal rate and rhythm, clear s1 and s2, no murmurs, rubs or gallops, no LE edema Pulmonary: CTAB, not in distress Abdominal: non distended abdomen, soft and nontender MSK: no obvious joint effusions or deformities in bilateral hands and wrists, very tender to palpation along medial aspect of wrists.  Hypersensitive to touch along posterior forearm L>R arms.  Strength testing of hand grip limited by pain   Social History   Socioeconomic History  . Marital status: Single    Spouse name: Not on file  . Number of children: 2  . Years of education: college  . Highest education level: Associate degree: academic program  Occupational History  . Occupation: Disabled  Social Needs  . Financial resource strain: Not on file  . Food insecurity    Worry: Not on file    Inability: Not on file  . Transportation needs  Medical: Not on file    Non-medical: Not on file  Tobacco Use  . Smoking status: Never Smoker  . Smokeless tobacco: Never Used  Substance and Sexual Activity  . Alcohol use: Yes    Alcohol/week: 1.0 - 3.0 standard drinks    Types: 1 - 3 Glasses of wine per week    Comment: occasional  . Drug use: No  . Sexual activity: Not on file  Lifestyle  . Physical activity    Days per week: Not on file    Minutes per session: Not on file  . Stress: Not on file  Relationships  . Social Herbalist on phone: Not on file    Gets together: Not on file    Attends religious service: Not on file    Active member of club or organization: Not on file    Attends meetings of clubs or organizations: Not  on file    Relationship status: Not on file  . Intimate partner violence    Fear of current or ex partner: Not on file    Emotionally abused: Not on file    Physically abused: Not on file    Forced sexual activity: Not on file  Other Topics Concern  . Not on file  Social History Narrative   Lives at home with her daughter.   Right-handed.   No caffeine per day.    Family History  Problem Relation Age of Onset  . Coronary artery disease Mother 68  . Other Father        killed  . Kidney disease Father   . Coronary artery disease Maternal Grandmother   . Cancer Other   . Hypertension Other   . Stroke Other   . Breast cancer Neg Hx     Assessment & Plan:   See Encounters Tab for problem based charting.  Patient discussed with Dr. Rebeca Alert
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
## 2019-02-07 ENCOUNTER — Ambulatory Visit (HOSPITAL_COMMUNITY)
Admission: RE | Admit: 2019-02-07 | Discharge: 2019-02-07 | Disposition: A | Discharge status: Home / Self Care | Payer: Medicare Other | Source: Ambulatory Visit | Attending: Internal Medicine | Admitting: Internal Medicine

## 2019-02-07 ENCOUNTER — Other Ambulatory Visit: Payer: Self-pay

## 2019-02-07 DIAGNOSIS — M79641 Pain in right hand: Secondary | ICD-10-CM | POA: Insufficient documentation

## 2019-02-07 DIAGNOSIS — M25532 Pain in left wrist: Secondary | ICD-10-CM

## 2019-02-07 DIAGNOSIS — M79642 Pain in left hand: Secondary | ICD-10-CM | POA: Diagnosis not present

## 2019-02-07 DIAGNOSIS — M25531 Pain in right wrist: Secondary | ICD-10-CM | POA: Diagnosis not present

## 2019-02-07 DIAGNOSIS — S6991XA Unspecified injury of right wrist, hand and finger(s), initial encounter: Secondary | ICD-10-CM | POA: Diagnosis not present

## 2019-02-07 DIAGNOSIS — S6992XA Unspecified injury of left wrist, hand and finger(s), initial encounter: Secondary | ICD-10-CM | POA: Diagnosis not present

## 2019-02-07 NOTE — Progress Notes (Signed)
Internal Medicine Clinic Attending  Case discussed with Dr. Winfrey at the time of the visit.  We reviewed the resident's history and exam and pertinent patient test results.  I agree with the assessment, diagnosis, and plan of care documented in the resident's note.  Alexander Raines, M.D., Ph.D.  

## 2019-02-08 LAB — C-REACTIVE PROTEIN: CRP: <1 mg/L (ref 0–10)

## 2019-02-08 LAB — RHEUMATOID FACTOR: Rheumatoid fact SerPl-aCnc: <10 IU/mL (ref 0.0–13.9)

## 2019-02-08 LAB — TSH: TSH: 1.3 u[IU]/mL (ref 0.450–4.500)

## 2019-02-08 LAB — CYCLIC CITRUL PEPTIDE ANTIBODY, IGG/IGA: Cyclic Citrullin Peptide Ab: 12 units (ref 0–19)

## 2019-02-09 ENCOUNTER — Encounter: Payer: Self-pay | Admitting: Internal Medicine

## 2019-02-12 ENCOUNTER — Telehealth: Payer: Self-pay | Admitting: Internal Medicine

## 2019-02-12 NOTE — Telephone Encounter (Signed)
Went over results of multiple x-ray bilateral hands and wrists, went over anti-CCP, rheumatoid factor, TSH lab results with patient.  So far she is found little benefit with the low-dose gabapentin, she reports no side effects of drowsiness.  She is scheduled a follow-up appointment with Dr. Krista Blue but is not until October.  In the meantime she will increase her gabapentin to 200 mg 3 times daily, we will see her for further work-up at her next appointment.

## 2019-02-17 ENCOUNTER — Encounter: Payer: Medicare Other | Admitting: Internal Medicine

## 2019-02-17 NOTE — Progress Notes (Deleted)
CC: Bilateral hand and wrist pain  HPI:  [name] is a 53 y.o. female with PMH below.  Today we will address Bilateral hand and wrist pain  Last visit we did a workup for inflammatory causes and examined the skeletal and joint space structures with x-rays.  This workup was negative and this could represent progression of the patient's neuropathy.  Will order a further neuropathy workup for the patient today and she has an appointment with Dr. Krista Blue neurology in October.    -add B12, B6, Spep/upep/ife, light chains    Please see A&P for status of the patient's chronic medical conditions  Past Medical History:  Diagnosis Date  . Asthma   . Biliary dyskinesia    a. s/p cholecystectomy.  Marland Kitchen BMI 40.0-44.9, adult ([place]) 06/11/2015  . 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 Abnl stress [place] 2008, f/u cath reportedly nl. b. ETT-Myoview 04/2011 - EKG changes but normal perfusion. Cor CT - no coronary calcium, no definite stenosis though mRCA not fully evaluated. c. 10/2011 - [place] in [place], LHC without CAD. Started on Ranexa, anti-anginals ?microvascular dz but later stopped while in hospital on abx.  . Complication of anesthesia    hard to wake up-had to be reminded to breath  . Diarrhea 08/09/2017  . GERD (gastroesophageal reflux disease)    a. Severe.  Marland Kitchen History of seizure 10/19/2017  . HTN (hypertension)   . Hx of cardiovascular stress test    Lex Myoview 8/14:  Normal, EF 74%  . Hx of echocardiogram    Echo 3/16:  Mild LVH, EF 55-60%, Gr 1 DD, trivial MR, mild LAE, normal RVF  . Hydradenitis 11/09/2016  . Hypothyroid   . Hypothyroidism 07/02/2013   Overview:  Last Assessment & Plan:   She reports a 21 pound increase in her weight since October. She doesn't know when the last time her TSH was checked so we will do that today.  . Low back pain 04/19/2016  . MI (myocardial infarction) ([place])   . Migraine   . Morbid obesity ([place]) 07/26/2017  . MRSA infection     a. After vagal nerve stimulator at [facility] - surgical site MRSA infection, PICC placed.  . Obesity   . OSA (obstructive sleep apnea) 07/26/2017  . Palpitations    a. 08/2014: 48 hour holter with 2 PVCs otherwise normal.  . Rectal pain 12/19/2017  . Seizure disorder ([place])    a. since childhood. b. s/p vagal nerve stimulator at [facility].  . Seizures ([place])    Review of Systems:  ***  Physical Exam:  There were no vitals filed for this visit. ***  Social History   Socioeconomic History  . Marital status: Single    Spouse name: Not on file  . Number of children: 2  . Years of education: college  . Highest education level: Associate degree: academic program  Occupational History  . Occupation: Disabled  Social Needs  . Financial resource strain: Not on file  . Food insecurity    Worry: Not on file    Inability: Not on file  . Transportation needs    Medical: Not on file    Non-medical: Not on file  Tobacco Use  . Smoking status: Never Smoker  . Smokeless tobacco: Never Used  Substance and Sexual Activity  . Alcohol use: Yes    Alcohol/week: 1.0 - 3.0 standard drinks    Types: 1 - 3 Glasses of  wine per week    Comment: occasional  . Drug use: No  . Sexual activity: Not on file  Lifestyle  . Physical activity    Days per week: Not on file    Minutes per session: Not on file  . Stress: Not on file  Relationships  . Social Herbalist on phone: Not on file    Gets together: Not on file    Attends religious service: Not on file    Active member of club or organization: Not on file    Attends meetings of clubs or organizations: Not on file    Relationship status: Not on file  . Intimate partner violence    Fear of current or ex partner: Not on file    Emotionally abused: Not on file    Physically abused: Not on file    Forced sexual activity: Not on file  Other Topics Concern  . Not on file  Social History Narrative   Lives at home with her daughter.    Right-handed.   No caffeine per day.   *** Family History  Problem Relation Age of Onset  . Coronary artery disease Mother 30  . Other Father        killed  . Kidney disease Father   . Coronary artery disease Maternal Grandmother   . Cancer Other   . Hypertension Other   . Stroke Other   . Breast cancer Neg Hx     Assessment & Plan:   See Encounters Tab for problem based charting.  Patient {GC/GE:[id number]::"discussed with","seen with"} Dr. {NAMES:[id number]::"Butcher","Granfortuna","E. Hoffman","Klima","Mullen","Narendra","Raines","Vincent"}

## 2019-02-24 ENCOUNTER — Other Ambulatory Visit: Payer: Self-pay | Admitting: Cardiology

## 2019-02-24 ENCOUNTER — Encounter: Payer: Medicare Other | Admitting: Internal Medicine

## 2019-02-25 ENCOUNTER — Other Ambulatory Visit: Payer: Self-pay

## 2019-02-25 ENCOUNTER — Encounter: Payer: Self-pay | Admitting: Internal Medicine

## 2019-02-25 ENCOUNTER — Ambulatory Visit (INDEPENDENT_AMBULATORY_CARE_PROVIDER_SITE_OTHER): Payer: Self-pay | Admitting: Internal Medicine

## 2019-02-25 VITALS — BP 144/96 | HR 82 | Temp 97.9°F | Ht 65.5 in | Wt 177.0 lb

## 2019-02-25 DIAGNOSIS — M25531 Pain in right wrist: Secondary | ICD-10-CM | POA: Insufficient documentation

## 2019-02-25 DIAGNOSIS — M549 Dorsalgia, unspecified: Secondary | ICD-10-CM

## 2019-02-25 DIAGNOSIS — M79642 Pain in left hand: Secondary | ICD-10-CM

## 2019-02-25 DIAGNOSIS — M79641 Pain in right hand: Secondary | ICD-10-CM | POA: Diagnosis not present

## 2019-02-25 DIAGNOSIS — Z79899 Other long term (current) drug therapy: Secondary | ICD-10-CM

## 2019-02-25 DIAGNOSIS — G8929 Other chronic pain: Secondary | ICD-10-CM

## 2019-02-25 DIAGNOSIS — Z23 Encounter for immunization: Secondary | ICD-10-CM

## 2019-02-25 DIAGNOSIS — M25569 Pain in unspecified knee: Secondary | ICD-10-CM

## 2019-02-25 DIAGNOSIS — M25532 Pain in left wrist: Secondary | ICD-10-CM

## 2019-02-25 HISTORY — DX: Pain in right wrist: M25.531

## 2019-02-25 MED ORDER — DULOXETINE HCL 30 MG PO CPEP: ORAL_CAPSULE | ORAL | 0 refills | Status: DC

## 2019-02-25 MED ORDER — GABAPENTIN 100 MG PO CAPS: 300.0000 mg | ORAL_CAPSULE | Freq: Two times a day (BID) | ORAL | 0 refills | Status: DC

## 2019-02-25 NOTE — Progress Notes (Signed)
CC: FU bilateral hand and wrist pain  HPI:  [name] is a 53 y.o. female with PMH below.  Today we will address FU bilateral hand and wrist pain  Please see A&P for status of the patient's chronic medical conditions  Past Medical History:  Diagnosis Date  . Asthma   . Biliary dyskinesia    a. s/p cholecystectomy.  Marland Kitchen BMI 40.0-44.9, adult ([place]) 06/11/2015  . 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 Abnl stress [place] 2008, f/u cath reportedly nl. b. ETT-Myoview 04/2011 - EKG changes but normal perfusion. Cor CT - no coronary calcium, no definite stenosis though mRCA not fully evaluated. c. 10/2011 - [place], LHC without CAD. Started on Ranexa, anti-anginals ?microvascular dz but later stopped while in hospital on abx.  . Complication of anesthesia    hard to wake up-had to be reminded to breath  . Diarrhea 08/09/2017  . GERD (gastroesophageal reflux disease)    a. Severe.  Marland Kitchen History of seizure 10/19/2017  . HTN (hypertension)   . Hx of cardiovascular stress test    Lex Myoview 8/14:  Normal, EF 74%  . Hx of echocardiogram    Echo 3/16:  Mild LVH, EF 55-60%, Gr 1 DD, trivial MR, mild LAE, normal RVF  . Hydradenitis 11/09/2016  . Hypothyroid   . Hypothyroidism 07/02/2013   Overview:  Last Assessment & Plan:   She reports a 21 pound increase in her weight since October. She doesn't know when the last time her TSH was checked so we will do that today.  . Low back pain 04/19/2016  . MI (myocardial infarction) ([place])   . Migraine   . Morbid obesity ([place]) 07/26/2017  . MRSA infection    a. After vagal nerve stimulator at [facility] - surgical site MRSA infection, PICC placed.  . Obesity   . OSA (obstructive sleep apnea) 07/26/2017  . Palpitations    a. 08/2014: 48 hour holter with 2 PVCs otherwise normal.  . Rectal pain 12/19/2017  . Seizure disorder ([place])    a. since childhood. b. s/p vagal nerve stimulator at  [facility].  . Seizures ([place])    Review of  Systems:  ROS: Pulmonary: pt denies increased work of breathing, shortness of breath,  Cardiac: pt denies palpitations, chest pain,  Abdominal: pt denies abdominal pain, nausea, vomiting, or diarrhea   Physical Exam:  Vitals:   02/25/19 1554 02/25/19 1602  BP: (!) 137/103 (!) 144/96  Pulse: 82   Temp: 97.9 F (36.6 C)   TempSrc: Oral   SpO2: 100%   Weight: 177 lb (80.3 kg)   Height: 5' 5.5" (1.664 m)    Cardiac: JVD flat, normal rate and rhythm, clear s1 and s2, no murmurs, rubs or gallops, no LE edema Pulmonary: CTAB, not in distress MSK:  very tender to palpation along medial and lateral aspect of wrists. Hypersensitive to touch along posterior forearm and lateral forearms. Strength testing of hand grip limited by pain  Psych: Alert, conversant, in good spirits   Social History   Socioeconomic History  . Marital status: Single    Spouse name: Not on file  . Number of children: 2  . Years of education: college  . Highest education level: Associate degree: academic program  Occupational History  . Occupation: Disabled  Social Needs  . Financial resource strain: Not on file  . Food insecurity    Worry: Not on file    Inability:  Not on file  . Transportation needs    Medical: Not on file    Non-medical: Not on file  Tobacco Use  . Smoking status: Never Smoker  . Smokeless tobacco: Never Used  Substance and Sexual Activity  . Alcohol use: Yes    Alcohol/week: 1.0 - 3.0 standard drinks    Types: 1 - 3 Glasses of wine per week    Comment: occasional  . Drug use: No  . Sexual activity: Not on file  Lifestyle  . Physical activity    Days per week: Not on file    Minutes per session: Not on file  . Stress: Not on file  Relationships  . Social Herbalist on phone: Not on file    Gets together: Not on file    Attends religious service: Not on file    Active member of club or organization: Not on file    Attends meetings of clubs or organizations: Not on  file    Relationship status: Not on file  . Intimate partner violence    Fear of current or ex partner: Not on file    Emotionally abused: Not on file    Physically abused: Not on file    Forced sexual activity: Not on file  Other Topics Concern  . Not on file  Social History Narrative   Lives at home with her daughter.   Right-handed.   No caffeine per day.    Family History  Problem Relation Age of Onset  . Coronary artery disease Mother 81  . Other Father        killed  . Kidney disease Father   . Coronary artery disease Maternal Grandmother   . Cancer Other   . Hypertension Other   . Stroke Other   . Breast cancer Neg Hx     Assessment & Plan:   See Encounters Tab for problem based charting.  Patient discussed with Dr. Lynnae January

## 2019-02-25 NOTE — Patient Instructions (Addendum)
We will keep working towards the answer to this pain.  In the mean time we will start you on cymbalta today and increase your gabapentin to 300mg  twice daily.  You can take this in the afternoon and evening per your preference.

## 2019-02-26 NOTE — Assessment & Plan Note (Addendum)
Today patient still complaining of bilateral upper extremity likely neuropathic pain.  She is not wearing her wrist braces for her carpal tunnel syndrome.  Pain seems to go all the way up her arms.  She has other complaints of chronic back pain and knee pain but seems to be less intense.    -We will order B12, B6, SPEP, IFE, light chains -Increase dosage of gabapentin to 300 mg twice daily -Start patient on Cymbalta -She has a follow-up with her neurologist to investigate this further as well as her neuropathy may extend beyond that associated with carpal tunnel

## 2019-02-27 DIAGNOSIS — Z9884 Bariatric surgery status: Secondary | ICD-10-CM | POA: Diagnosis not present

## 2019-02-27 DIAGNOSIS — Z48815 Encounter for surgical aftercare following surgery on the digestive system: Secondary | ICD-10-CM | POA: Diagnosis not present

## 2019-03-03 DIAGNOSIS — E663 Overweight: Secondary | ICD-10-CM | POA: Diagnosis not present

## 2019-03-03 DIAGNOSIS — Z713 Dietary counseling and surveillance: Secondary | ICD-10-CM | POA: Diagnosis not present

## 2019-03-03 DIAGNOSIS — Z9884 Bariatric surgery status: Secondary | ICD-10-CM | POA: Diagnosis not present

## 2019-03-03 LAB — MULTIPLE MYELOMA PANEL, SERUM
Albumin SerPl Elph-Mcnc: 4.1 g/dL (ref 2.9–4.4)
Albumin/Glob SerPl: 1.5 (ref 0.7–1.7)
Alpha 1: 0.2 g/dL (ref 0.0–0.4)
Alpha2 Glob SerPl Elph-Mcnc: 0.6 g/dL (ref 0.4–1.0)
B-Globulin SerPl Elph-Mcnc: 0.9 g/dL (ref 0.7–1.3)
Gamma Glob SerPl Elph-Mcnc: 1.2 g/dL (ref 0.4–1.8)
Globulin, Total: 2.9 g/dL (ref 2.2–3.9)
IgA/Immunoglobulin A, Serum: 158 mg/dL (ref 87–352)
IgG (Immunoglobin G), Serum: 1369 mg/dL (ref 586–1602)
IgM (Immunoglobulin M), Srm: 23 mg/dL — ABNORMAL LOW (ref 26–217)
Total Protein: 7 g/dL (ref 6.0–8.5)

## 2019-03-03 LAB — KAPPA/LAMBDA LIGHT CHAINS
Ig Kappa Free Light Chain: 17.2 mg/L (ref 3.3–19.4)
Ig Lambda Free Light Chain: 16 mg/L (ref 5.7–26.3)
Kappa/Lambda FluidC Ratio: 1.08 (ref 0.26–1.65)

## 2019-03-03 LAB — VITAMIN B12: Vitamin B-12: 1229 pg/mL (ref 232–1245)

## 2019-03-03 LAB — VITAMIN B6: Vitamin B6: 9 ug/L (ref 2.0–32.8)

## 2019-03-03 NOTE — Progress Notes (Signed)
Internal Medicine Clinic Attending  Case discussed with Dr. Winfrey  at the time of the visit.  We reviewed the resident's history and exam and pertinent patient test results.  I agree with the assessment, diagnosis, and plan of care documented in the resident's note.  

## 2019-03-11 ENCOUNTER — Encounter: Payer: Self-pay | Admitting: Neurology

## 2019-03-11 ENCOUNTER — Telehealth: Payer: Self-pay | Admitting: *Deleted

## 2019-03-11 ENCOUNTER — Ambulatory Visit: Payer: Medicare Other | Admitting: Neurology

## 2019-03-11 NOTE — Telephone Encounter (Signed)
No showed follow up appointment. 

## 2019-03-14 ENCOUNTER — Other Ambulatory Visit: Payer: Self-pay | Admitting: Internal Medicine

## 2019-03-14 DIAGNOSIS — M25532 Pain in left wrist: Secondary | ICD-10-CM

## 2019-03-14 DIAGNOSIS — M79641 Pain in right hand: Secondary | ICD-10-CM

## 2019-03-14 DIAGNOSIS — M25531 Pain in right wrist: Secondary | ICD-10-CM

## 2019-03-14 DIAGNOSIS — M79642 Pain in left hand: Secondary | ICD-10-CM

## 2019-04-17 DIAGNOSIS — Z20828 Contact with and (suspected) exposure to other viral communicable diseases: Secondary | ICD-10-CM | POA: Diagnosis not present

## 2019-04-22 ENCOUNTER — Ambulatory Visit: Payer: Medicare Other | Admitting: Neurology

## 2019-04-30 ENCOUNTER — Ambulatory Visit: Payer: Medicare Other | Admitting: Neurology

## 2019-05-08 ENCOUNTER — Encounter: Payer: Self-pay | Admitting: Neurology

## 2019-05-08 ENCOUNTER — Ambulatory Visit (INDEPENDENT_AMBULATORY_CARE_PROVIDER_SITE_OTHER): Payer: Medicare Other | Admitting: Neurology

## 2019-05-08 ENCOUNTER — Telehealth: Payer: Self-pay | Admitting: Neurology

## 2019-05-08 ENCOUNTER — Other Ambulatory Visit: Payer: Self-pay

## 2019-05-08 VITALS — BP 153/94 | HR 88 | Temp 96.8°F | Ht 65.5 in | Wt 176.0 lb

## 2019-05-08 DIAGNOSIS — M25531 Pain in right wrist: Secondary | ICD-10-CM

## 2019-05-08 DIAGNOSIS — R202 Paresthesia of skin: Secondary | ICD-10-CM | POA: Diagnosis not present

## 2019-05-08 DIAGNOSIS — R569 Unspecified convulsions: Secondary | ICD-10-CM

## 2019-05-08 DIAGNOSIS — M25532 Pain in left wrist: Secondary | ICD-10-CM

## 2019-05-08 DIAGNOSIS — G40019 Localization-related (focal) (partial) idiopathic epilepsy and epileptic syndromes with seizures of localized onset, intractable, without status epilepticus: Secondary | ICD-10-CM | POA: Diagnosis not present

## 2019-05-08 MED ORDER — TOPIRAMATE 200 MG PO TABS: 200.0000 mg | ORAL_TABLET | Freq: Two times a day (BID) | ORAL | 4 refills | Status: DC

## 2019-05-08 NOTE — Telephone Encounter (Signed)
FYI- Patient's chart would not allow for me to schedule EEG with EEG tech at check-out due to patient preferences. I will look into this and call patient back to discuss.

## 2019-05-08 NOTE — Progress Notes (Signed)
PATIENT: [name] DOB: [birth year]  Chief Complaint  Patient presents with  . Pain    New problem of pain in neck, left arm and left wrist.  She also has pain in her right wrist but not as severe.     HISTORICAL  [name] is a 53 year old female, seen in refer by her primary care doctor Guadlupe Spanish for evaluation of right arm pain, initial evaluation was on June 12, 2017.  I reviewed and summarized the referring note, she has history of hypertension, hypothyroidism, coronary artery disease, the patient at [facility] weight loss program, candidate for bariatric surgery, BMI more than 50, she had a history of vagal nerve stimulator in the past,  I was able to review [facility] neurology visit by Dr. 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, based on the note, she is supposed to take Keppra 500 twice daily, Topamax 200 mg twice daily, reported seizure spells around the time of her menstruation, described seizure as dizziness spells, followed by room spinning, inability to move, then having generalized tonic-clonic body shaking, episodes last for a few minutes,  In 2014, she was treated with prolonged antibiotic status post vagal nerve stimulator pocket infection with Musser and Morganella morganii, she completed 4-week course of ertapenem, Dapto, and rifampin on August 19, 2012, was started on oral suppression with TMP/SMX on August 20, 2012, continued p.o. antibiotic without problems,  She also complains of pain and swelling of right arm, even went through cardiology evaluation that was determined not to be cardiac etiology,  She was seen by neurologist on Oct 23, 2012, EMG.NCS was normal in July 2014.  Today she continue complains of right arm  swelling, right shoulder pain, radiating to right lateral arm, pronation and supination aggravate her right arm pain, intermittent weak grip on the right-hand side, difficult to make a tight fist,  She also complains of mild neck pain, mild gait abnormality, denies bowel bladder incontinence.  Complains of frequent dizzy spells, she continue complains of seizure-like activity, most recent one was in June 09, 2017, she stares, she can feel it coming on, get confused, lasting for a few minutes,  She is only taking Topamax 100 mg 2 tablets twice daily, she has stopped the Keppra due to side effects,  UPDATE January 07 2018: She had her bariatric surgery on Oct 26 2017, works very well, she has lost 60 pounds  She presented to the emergency room on January 01, 2018, she was noted to have elevated blood pressure, up to 55/16, heart rate was 115 per patient, she did not feel good, when she is 53 up, bilateral lower extremity give out underneath normal underscore, she describes difficulty getting her words out, right arm weakness, right facial asymmetry, she was taken to the emergency room, CT head without contrast was normal, the whole episode lasted about 30 minutes,  Now she continue complains of right shoulder pain, limited range of motion of the right shoulder, gait abnormality  UPDATE May 08 2019: She complains of bilateral wrist pain since July 2020, left worse than right, difficulty using her left hand, also complains of intermittent bilateral hands paresthesia, she also complains of urinary urgency, neck pain, radiating to right shoulder and arm,  She supposed to take Topamax 200 mg twice daily,  complains of increased seizure frequency since June 2020, every other day, last seizure was 2 days ago, she complains of dizzy, strange sensation trouble throughout her body, stuttering of the speech, she could hear, but could not respond to surroundings, lasting for few minutes,   REVIEW OF SYSTEMS: Full  14 system review of systems performed and notable only for as above  ALLERGIES: Allergies  Allergen Reactions  . Amitriptyline Swelling  . Amlodipine Swelling  . Latex Hives, Itching, Swelling and Rash    Burning also  . Amantadines Swelling  . Aspirin Nausea Only    Other reaction(s): Other (See Comments) Irritates stomach also/hx of stomach ulcers  . Simvastatin Swelling    Other reaction(s): Other (See Comments) pain Muscle pains also  . Tape Rash    Pt able to tolerate paper tape     HOME MEDICATIONS: Current Outpatient Medications  Medication Sig Dispense Refill  . acetaminophen (TYLENOL) 325 MG tablet Take 650 mg by mouth every 6 (six) hours as needed for headache.     . albuterol (PROVENTIL HFA;VENTOLIN HFA) 108 (90 Base) MCG/ACT inhaler Inhale 2 puffs into the lungs every 6 (six) hours as needed for wheezing or shortness of breath. 1 Inhaler 6  . carvedilol (COREG) 25 MG tablet Take 25 mg by mouth 2 (two) times daily.  0  . diclofenac sodium (VOLTAREN) 1 % GEL APPLY 4 GRAMS TOPICALLY 4 (FOUR) TIMES DAILY. 300 g 1  . diltiazem (CARDIZEM CD) 120 MG 24 hr capsule TAKE 1 CAPSULE BY MOUTH EVERY DAY 90 capsule 2  . furosemide (LASIX) 20 MG tablet Take 1 tablet (20 mg total) by mouth daily. 93 tablet 3  . gabapentin (NEURONTIN) 100 MG capsule Take 3 capsules (300 mg total) by mouth 2 (two) times daily. 270 capsule 0  . levothyroxine (SYNTHROID, LEVOTHROID) 50 MCG tablet Take 50 mcg by mouth daily.     Marland Kitchen lidocaine-prilocaine (EMLA) cream APPLY 1 APPLICATION TOPICALLY AS NEEDED. 30 g 0  . mometasone-formoterol (DULERA) 100-5 MCG/ACT AERO Inhale 2 puffs into the lungs 2 (two) times daily. 1 Inhaler 2  . Multiple Vitamins-Minerals (ADEKS PO) Take 1 tablet by mouth daily.    . naproxen (NAPROSYN) 375 MG tablet Take 1 tablet (375 mg total) by mouth 2 (two) times daily. 20 tablet 0  . nitroGLYCERIN (NITRODUR - DOSED IN MG/24 HR) 0.2 mg/hr patch PLACE 1 PATCH ONTO THE SKIN DAILY. 30  patch 1  . nitroGLYCERIN (NITROSTAT) 0.4 MG SL tablet PLACE 1 TAB UNDER TONGUE EVERY 5 MINUTES AS NEEDED FOR CHEST PAIN, MAX 3/15 MINS 25 tablet 6  . spironolactone (ALDACTONE) 25 MG tablet Take 1 tablet (25 mg total) by mouth daily. 90 tablet 3  . topiramate (TOPAMAX) 200 MG tablet Take 1 tablet (200 mg total) by mouth 2 (two) times daily. 60 tablet 11  . VITAMIN D, CHOLECALCIFEROL, PO Take 1 tablet by mouth daily.    Marland Kitchen lisinopril (PRINIVIL,ZESTRIL) 20 MG tablet Take 1 tablet (20 mg total) by mouth daily. 90 tablet 3   No current facility-administered medications for this visit.     PAST MEDICAL HISTORY: Past Medical History:  Diagnosis Date  . Asthma   . Biliary dyskinesia    a. s/p cholecystectomy.  Marland Kitchen BMI 40.0-44.9, adult ([place]) 06/11/2015  . 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 Abnl stress [place] 2008, f/u cath reportedly nl. b. ETT-Myoview 04/2011 - EKG changes but normal perfusion. Cor CT - no coronary calcium,  no definite stenosis though mRCA not fully evaluated. c. 10/2011 - tn elevated in [place], LHC without CAD. Started on Ranexa, anti-anginals ?microvascular dz but later stopped while in hospital on abx.  . Complication of anesthesia    hard to wake up-had to be reminded to breath  . Diarrhea 08/09/2017  . GERD (gastroesophageal reflux disease)    a. Severe.  Marland Kitchen History of seizure 10/19/2017  . HTN (hypertension)   . Hx of cardiovascular stress test    [place] Myoview 8/14:  Normal, EF 74%  . Hx of echocardiogram    Echo 3/16:  Mild LVH, EF 55-60%, Gr 1 DD, trivial MR, mild LAE, normal RVF  . Hydradenitis 11/09/2016  . Hypothyroid   . Hypothyroidism 07/02/2013   Overview:  Last Assessment & Plan:   She reports a 21 pound increase in her weight since October. She doesn't know when the last time her TSH was checked so we will do that today.  . Low back pain 04/19/2016  . MI (myocardial infarction) ([place])   . Migraine   . Morbid obesity ([place]) 07/26/2017  . MRSA  infection    a. After vagal nerve stimulator at [facility] - surgical site MRSA infection, PICC placed.  . Obesity   . OSA (obstructive sleep apnea) 07/26/2017  . Palpitations    a. 08/2014: 48 hour holter with 2 PVCs otherwise normal.  . Rectal pain 12/19/2017  . Seizure disorder ([place])    a. since childhood. b. s/p vagal nerve stimulator at [facility].  . Seizures ([place])     PAST SURGICAL HISTORY: Past Surgical History:  Procedure Laterality Date  . [place] N/A 06/01/2016   Procedure: Right Heart Cath;  Surgeon: Larey Dresser, MD;  Location: [place] [facility];  Service: Cardiovascular;  Laterality: N/A;  . CESAREAN SECTION     placement of vagal nerve stimulator.  . CHOLECYSTECTOMY N/A 01/24/2013   Procedure: LAPAROSCOPIC CHOLECYSTECTOMY;  Surgeon: Harl Bowie, MD;  Location: [place];  Service: General;  Laterality: N/A;  . IMPLANTATION VAGAL NERVE STIMULATOR  [phone]   battery chg-[facility]    FAMILY HISTORY: Family History  Problem Relation Age of Onset  . Coronary artery disease Mother 16  . Other Father        killed  . Kidney disease Father   . Coronary artery disease Maternal Grandmother   . Cancer Other   . Hypertension Other   . Stroke Other   . Breast cancer Neg Hx     SOCIAL HISTORY:  Social History   Socioeconomic History  . Marital status: Single    Spouse name: Not on file  . Number of children: 2  . Years of education: college  . Highest education level: Associate degree: academic program  Occupational History  . Occupation: Disabled  Social Needs  . Financial resource strain: Not on file  . Food insecurity    Worry: Not on file    Inability: Not on file  . Transportation needs    Medical: Not on file    Non-medical: Not on file  Tobacco Use  . Smoking status: Never Smoker  . Smokeless tobacco: Never Used  Substance and Sexual Activity  . Alcohol  use: Yes    Alcohol/week: 1.0 - 3.0 standard drinks    Types: 1 - 3 Glasses of wine per week    Comment: occasional  . Drug use: No  .  Sexual activity: Not on file  Lifestyle  . Physical activity    Days per week: Not on file    Minutes per session: Not on file  . Stress: Not on file  Relationships  . Social Herbalist on phone: Not on file    Gets together: Not on file    Attends religious service: Not on file    Active member of club or organization: Not on file    Attends meetings of clubs or organizations: Not on file    Relationship status: Not on file  . Intimate partner violence    Fear of current or ex partner: Not on file    Emotionally abused: Not on file    Physically abused: Not on file    Forced sexual activity: Not on file  Other Topics Concern  . Not on file  Social History Narrative   Lives at home with her daughter.   Right-handed.   No caffeine per day.     PHYSICAL EXAM   Vitals:   05/08/19 0727  Temp: (!) 96.8 F (36 C)  Weight: 176 lb (79.8 kg)  Height: 5' 5.5" (1.664 m)    Not recorded      Body mass index is 28.84 kg/m.  PHYSICAL EXAMNIATION:  Gen: NAD, conversant, well nourised, obese, well groomed                     Cardiovascular: Regular rate rhythm, no peripheral edema, warm, nontender. Eyes: Conjunctivae clear without exudates or hemorrhage Neck: Supple, no carotid bruits. Pulmonary: Clear to auscultation bilaterally  Musculoskeletal: Left wrist pain upon deep palpitation  NEUROLOGICAL EXAM:  MENTAL STATUS: Speech:    Speech is normal; fluent and spontaneous with normal comprehension.  Cognition:     Orientation to time, place and person     Normal recent and remote memory     Normal Attention span and concentration     Normal Language, naming, repeating,spontaneous speech     Fund of knowledge   CRANIAL NERVES: CN II: Visual fields are full to confrontation.  Pupils are round equal and briskly reactive to  light. CN III, IV, VI: extraocular movement are normal. No ptosis. CN V: Facial sensation is intact to pinprick in all 3 divisions bilaterally. Corneal responses are intact.  CN VII: Face is symmetric with normal eye closure and smile. CN VIII: Hearing is normal to rubbing fingers CN IX, X: Palate elevates symmetrically. Phonation is normal. CN XI: Head turning and shoulder shrug are intact CN XII: Tongue is midline with normal movements and no atrophy.  MOTOR: Right shoulder pain, limited range of motion, no significant weakness, variable effort on examinations,  REFLEXES: Reflexes are 2+ and symmetric at the biceps, triceps, knees, and ankles. Plantar responses are flexor.  SENSORY: Intact to light touch, pinprick, positional sensation and vibratory sensation are intact in fingers and toes.  COORDINATION: Rapid alternating movements and fine finger movements are intact. There is no dysmetria on finger-to-nose and heel-knee-shin.    GAIT/STANCE: She rely on her walker, cautious, mildly unsteady  DIAGNOSTIC DATA (LABS, IMAGING, TESTING) - I reviewed patient records, labs, notes, testing and imaging myself where available.   ASSESSMENT AND PLAN  ANACECILIA DEMESA is a 53 y.o. female   Bilateral hand paresthesia,pain  EMG nerve conduction study to rule out bilateral carpal tunnel syndrome.  Laboratory evaluations to rule out systemic inflammatory process    Epilepsy  Continue Topamax 200 mg  twice a day,   EEG  Check Topamax level  Also performed vagal nerve stimulation interrogation and parameter changes.  VNA setting on January 16 2018:  Output Current (mA) 1.25 Signal Frequency (Hz) 20 Pulse Width (microsec) 250 Signal on Time (sec)  30 Signal off time (min) 5.0  Magnet Output current (mA) 1.5 Pulse Width (microsec) 250 Signal On Time (sec) 60  Model PULSE 102. Serial [id number] Implanted 2012-05-22.      Marcial Pacas, M.D. Ph.D.  [facility]  [address], [place], [place] [address] Ph: [phone] Fax: [phone]  CC: Katherine Roan, MD

## 2019-05-12 ENCOUNTER — Telehealth: Payer: Self-pay | Admitting: Neurology

## 2019-05-12 LAB — C-REACTIVE PROTEIN: CRP: <1 mg/L (ref 0–10)

## 2019-05-12 LAB — TOPIRAMATE LEVEL: Topiramate Lvl: <1 ug/mL — ABNORMAL LOW (ref 2.0–25.0)

## 2019-05-12 LAB — ANA W/REFLEX IF POSITIVE: Anti Nuclear Antibody (ANA): NEGATIVE

## 2019-05-12 LAB — SEDIMENTATION RATE: Sed Rate: 3 mm/hr (ref 0–40)

## 2019-05-12 NOTE — Telephone Encounter (Signed)
Please call patient, laboratory evaluation showed no evidence of systemic inflammatory process  Topamax level was less than 1, indicating patient has not been compliant with her medications, please advise her be compliant with her Topamax 200 mg twice a day,

## 2019-05-12 NOTE — Telephone Encounter (Signed)
States she usually takes her Topamax 200mg , two tablets in the morning.  Denies missing any doses.  She is going to start taking it BID.    Reports feeling an "uncomfortable" feeling her throat since her last VNS increase.  She will be the office on 05/14/2019 at 3:15pm for her EEG.  She would like for Dr. Krista Blue to check her settings.

## 2019-05-14 ENCOUNTER — Telehealth: Payer: Self-pay | Admitting: Neurology

## 2019-05-14 ENCOUNTER — Other Ambulatory Visit: Payer: Medicare Other

## 2019-05-14 NOTE — Telephone Encounter (Signed)
She complains of irritation with stimulation, has decreased output current from 1.25 to1, magnetic output current from 1.5 to 1.25  VNA setting on January 16 2018:  Output Current (mA) 1 00 Signal Frequency (Hz) 20 Pulse Width (microsec) 250 Signal on Time (sec)  30 Signal off time (min) 5.0  Magnet Output current (mA) 1.25 Pulse Width (microsec) 250 Signal On Time (sec) 60  Model PULSE 102. Serial [id number] Implanted 2012-05-22.

## 2019-05-20 DIAGNOSIS — F4310 Post-traumatic stress disorder, unspecified: Secondary | ICD-10-CM | POA: Diagnosis not present

## 2019-05-20 DIAGNOSIS — F411 Generalized anxiety disorder: Secondary | ICD-10-CM | POA: Diagnosis not present

## 2019-05-20 DIAGNOSIS — F331 Major depressive disorder, recurrent, moderate: Secondary | ICD-10-CM | POA: Diagnosis not present

## 2019-05-26 ENCOUNTER — Other Ambulatory Visit: Payer: Self-pay | Admitting: *Deleted

## 2019-05-26 MED ORDER — LISINOPRIL 20 MG PO TABS: 20.0000 mg | ORAL_TABLET | Freq: Every day | ORAL | 1 refills | Status: DC

## 2019-05-28 ENCOUNTER — Ambulatory Visit (INDEPENDENT_AMBULATORY_CARE_PROVIDER_SITE_OTHER): Payer: Medicare Other

## 2019-05-28 ENCOUNTER — Other Ambulatory Visit: Payer: Self-pay

## 2019-05-28 DIAGNOSIS — R569 Unspecified convulsions: Secondary | ICD-10-CM

## 2019-05-28 DIAGNOSIS — R202 Paresthesia of skin: Secondary | ICD-10-CM

## 2019-05-28 DIAGNOSIS — M25531 Pain in right wrist: Secondary | ICD-10-CM

## 2019-05-28 DIAGNOSIS — M25532 Pain in left wrist: Secondary | ICD-10-CM

## 2019-06-02 ENCOUNTER — Emergency Department (HOSPITAL_COMMUNITY)
Admission: EM | Admit: 2019-06-02 | Discharge: 2019-06-02 | Discharge status: Against Medical Advice | Payer: Medicare Other | Attending: Emergency Medicine | Admitting: Emergency Medicine

## 2019-06-02 ENCOUNTER — Encounter (HOSPITAL_COMMUNITY): Payer: Self-pay | Admitting: Emergency Medicine

## 2019-06-02 ENCOUNTER — Other Ambulatory Visit: Payer: Self-pay

## 2019-06-02 DIAGNOSIS — R531 Weakness: Secondary | ICD-10-CM | POA: Diagnosis not present

## 2019-06-02 DIAGNOSIS — I10 Essential (primary) hypertension: Secondary | ICD-10-CM | POA: Diagnosis not present

## 2019-06-02 DIAGNOSIS — R410 Disorientation, unspecified: Secondary | ICD-10-CM | POA: Diagnosis not present

## 2019-06-02 DIAGNOSIS — Z5321 Procedure and treatment not carried out due to patient leaving prior to being seen by health care provider: Secondary | ICD-10-CM | POA: Diagnosis not present

## 2019-06-02 DIAGNOSIS — R569 Unspecified convulsions: Secondary | ICD-10-CM | POA: Diagnosis not present

## 2019-06-02 LAB — CBC
HCT: 36.6 % (ref 36.0–46.0)
Hemoglobin: 11.5 g/dL — ABNORMAL LOW (ref 12.0–15.0)
MCH: 27.9 pg (ref 26.0–34.0)
MCHC: 31.4 g/dL (ref 30.0–36.0)
MCV: 88.8 fL (ref 80.0–100.0)
Platelets: 176 10*3/uL (ref 150–400)
RBC: 4.12 MIL/uL (ref 3.87–5.11)
RDW: 12.2 % (ref 11.5–15.5)
WBC: 4 10*3/uL (ref 4.0–10.5)
nRBC: 0 % (ref 0.0–0.2)

## 2019-06-02 LAB — BASIC METABOLIC PANEL
Anion gap: 8 (ref 5–15)
BUN: 11 mg/dL (ref 6–20)
CO2: 24 mmol/L (ref 22–32)
Calcium: 8.9 mg/dL (ref 8.9–10.3)
Chloride: 108 mmol/L (ref 98–111)
Creatinine, Ser: 0.9 mg/dL (ref 0.44–1.00)
GFR calc Af Amer: >60 mL/min (ref >60)
GFR calc non Af Amer: >60 mL/min (ref >60)
Glucose, Bld: 92 mg/dL (ref 70–99)
Potassium: 3.5 mmol/L (ref 3.5–5.1)
Sodium: 140 mmol/L (ref 135–145)

## 2019-06-02 NOTE — ED Notes (Signed)
Pt stated that she was not waiting anymore because of the wait. Was expressed to the pt x3 to stay and try to wait out the time frame in order to be seen. Pt stated no she will be going to urgent care tomorrow.
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
## 2019-06-04 ENCOUNTER — Emergency Department (HOSPITAL_COMMUNITY)
Admission: EM | Admit: 2019-06-04 | Discharge: 2019-06-05 | Disposition: A | Discharge status: Home / Self Care | Payer: Medicare Other | Attending: Emergency Medicine | Admitting: Emergency Medicine

## 2019-06-04 ENCOUNTER — Encounter (HOSPITAL_COMMUNITY): Payer: Self-pay

## 2019-06-04 ENCOUNTER — Emergency Department (HOSPITAL_COMMUNITY): Payer: Medicare Other

## 2019-06-04 ENCOUNTER — Ambulatory Visit (HOSPITAL_COMMUNITY)
Admission: EM | Admit: 2019-06-04 | Discharge: 2019-06-04 | Disposition: A | Discharge status: Home / Self Care | Payer: Medicare Other | Source: Home / Self Care

## 2019-06-04 ENCOUNTER — Other Ambulatory Visit: Payer: Self-pay

## 2019-06-04 DIAGNOSIS — E039 Hypothyroidism, unspecified: Secondary | ICD-10-CM | POA: Insufficient documentation

## 2019-06-04 DIAGNOSIS — G44309 Post-traumatic headache, unspecified, not intractable: Secondary | ICD-10-CM | POA: Diagnosis not present

## 2019-06-04 DIAGNOSIS — Y998 Other external cause status: Secondary | ICD-10-CM | POA: Insufficient documentation

## 2019-06-04 DIAGNOSIS — I110 Hypertensive heart disease with heart failure: Secondary | ICD-10-CM | POA: Insufficient documentation

## 2019-06-04 DIAGNOSIS — S0990XA Unspecified injury of head, initial encounter: Secondary | ICD-10-CM | POA: Diagnosis not present

## 2019-06-04 DIAGNOSIS — R110 Nausea: Secondary | ICD-10-CM | POA: Diagnosis not present

## 2019-06-04 DIAGNOSIS — M25551 Pain in right hip: Secondary | ICD-10-CM | POA: Insufficient documentation

## 2019-06-04 DIAGNOSIS — G44319 Acute post-traumatic headache, not intractable: Secondary | ICD-10-CM | POA: Insufficient documentation

## 2019-06-04 DIAGNOSIS — S060X0A Concussion without loss of consciousness, initial encounter: Secondary | ICD-10-CM | POA: Diagnosis not present

## 2019-06-04 DIAGNOSIS — Z79899 Other long term (current) drug therapy: Secondary | ICD-10-CM | POA: Insufficient documentation

## 2019-06-04 DIAGNOSIS — R519 Headache, unspecified: Secondary | ICD-10-CM | POA: Diagnosis not present

## 2019-06-04 DIAGNOSIS — Y92018 Other place in single-family (private) house as the place of occurrence of the external cause: Secondary | ICD-10-CM | POA: Diagnosis not present

## 2019-06-04 DIAGNOSIS — I5032 Chronic diastolic (congestive) heart failure: Secondary | ICD-10-CM | POA: Diagnosis not present

## 2019-06-04 DIAGNOSIS — R040 Epistaxis: Secondary | ICD-10-CM | POA: Diagnosis not present

## 2019-06-04 DIAGNOSIS — Y9389 Activity, other specified: Secondary | ICD-10-CM | POA: Insufficient documentation

## 2019-06-04 DIAGNOSIS — Z91040 Latex allergy status: Secondary | ICD-10-CM | POA: Diagnosis not present

## 2019-06-04 LAB — I-STAT BETA HCG BLOOD, ED (MC, WL, AP ONLY): I-stat hCG, quantitative: <5 m[IU]/mL (ref <5)

## 2019-06-04 MED ORDER — FENTANYL CITRATE (PF) 100 MCG/2ML IJ SOLN
100.0000 ug | Freq: Once | INTRAMUSCULAR | Status: AC
  Administered 2019-06-04: 100 ug via INTRAVENOUS
  Filled 2019-06-04: qty 2

## 2019-06-04 MED ORDER — SODIUM CHLORIDE 0.9 % IV BOLUS
500.0000 mL | Freq: Once | INTRAVENOUS | Status: AC
  Administered 2019-06-04: 500 mL via INTRAVENOUS

## 2019-06-04 MED ORDER — DIPHENHYDRAMINE HCL 50 MG/ML IJ SOLN
25.0000 mg | Freq: Once | INTRAMUSCULAR | Status: AC
  Administered 2019-06-04: 25 mg via INTRAVENOUS
  Filled 2019-06-04: qty 1

## 2019-06-04 MED ORDER — METOCLOPRAMIDE HCL 5 MG/ML IJ SOLN
10.0000 mg | Freq: Once | INTRAMUSCULAR | Status: AC
  Administered 2019-06-04: 10 mg via INTRAVENOUS
  Filled 2019-06-04: qty 2

## 2019-06-04 MED ORDER — KETOROLAC TROMETHAMINE 15 MG/ML IJ SOLN
15.0000 mg | Freq: Once | INTRAMUSCULAR | Status: AC
  Administered 2019-06-04: 15 mg via INTRAVENOUS
  Filled 2019-06-04: qty 1
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
## 2019-06-05 MED ORDER — METHOCARBAMOL 500 MG PO TABS: 1000.0000 mg | ORAL_TABLET | Freq: Four times a day (QID) | ORAL | 0 refills | Status: DC
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
## 2019-06-11 DIAGNOSIS — F331 Major depressive disorder, recurrent, moderate: Secondary | ICD-10-CM | POA: Diagnosis not present

## 2019-06-11 DIAGNOSIS — F4310 Post-traumatic stress disorder, unspecified: Secondary | ICD-10-CM | POA: Diagnosis not present

## 2019-06-11 DIAGNOSIS — F411 Generalized anxiety disorder: Secondary | ICD-10-CM | POA: Diagnosis not present

## 2019-06-13 NOTE — Procedures (Signed)
   HISTORY: 54 year old female with a history of seizure, status post vagal nerve stimulation placement,  TECHNIQUE:  This is a routine 16 channel EEG recording with one channel devoted to a limited EKG recording.  It was performed during wakefulness, drowsiness and asleep.  Hyperventilation and photic stimulation were performed as activating procedures.  There are minimum muscle and movement artifact noted.  Upon maximum arousal, posterior dominant waking rhythm consistent of rhythmic alpha range activity, with frequency of 9 hz. Activities are symmetric over the bilateral posterior derivations and attenuated with eye opening.  Hyperventilation produced mild/moderate buildup with higher amplitude and the slower activities noted.  Photic stimulation did not alter the tracing.  During EEG recording, patient developed drowsiness and entered sleep, sleep EEG demonstrated architecture, there were frontal centrally dominant vertex waves and symmetric sleep spindles noted.  During EEG recording, there was no epileptiform discharge noted.  EKG demonstrate sinus rhythm, with heart rate of 72 bpm  CONCLUSION: This is a  normal awake and asleep EEG.  There is no electrodiagnostic evidence of epileptiform discharge.  Marcial Pacas, M.D. Ph.D.  [facility] [place], [place] [address] Phone: [phone] Fax:      [phone]

## 2019-06-19 DIAGNOSIS — Z9884 Bariatric surgery status: Secondary | ICD-10-CM | POA: Diagnosis not present

## 2019-06-19 DIAGNOSIS — Z48815 Encounter for surgical aftercare following surgery on the digestive system: Secondary | ICD-10-CM | POA: Diagnosis not present

## 2019-06-19 DIAGNOSIS — R197 Diarrhea, unspecified: Secondary | ICD-10-CM | POA: Diagnosis not present

## 2019-06-25 DIAGNOSIS — Z79899 Other long term (current) drug therapy: Secondary | ICD-10-CM | POA: Diagnosis not present

## 2019-06-25 DIAGNOSIS — I2510 Atherosclerotic heart disease of native coronary artery without angina pectoris: Secondary | ICD-10-CM | POA: Diagnosis not present

## 2019-06-25 DIAGNOSIS — K909 Intestinal malabsorption, unspecified: Secondary | ICD-10-CM | POA: Diagnosis not present

## 2019-06-25 DIAGNOSIS — Z9884 Bariatric surgery status: Secondary | ICD-10-CM | POA: Diagnosis not present

## 2019-06-25 DIAGNOSIS — Z87898 Personal history of other specified conditions: Secondary | ICD-10-CM | POA: Diagnosis not present

## 2019-06-25 DIAGNOSIS — E785 Hyperlipidemia, unspecified: Secondary | ICD-10-CM | POA: Diagnosis not present

## 2019-06-26 DIAGNOSIS — Z48815 Encounter for surgical aftercare following surgery on the digestive system: Secondary | ICD-10-CM | POA: Diagnosis not present

## 2019-06-26 DIAGNOSIS — R197 Diarrhea, unspecified: Secondary | ICD-10-CM | POA: Diagnosis not present

## 2019-06-26 DIAGNOSIS — Z9884 Bariatric surgery status: Secondary | ICD-10-CM | POA: Diagnosis not present

## 2019-06-28 ENCOUNTER — Other Ambulatory Visit: Payer: Self-pay | Admitting: Internal Medicine

## 2019-07-07 ENCOUNTER — Ambulatory Visit (INDEPENDENT_AMBULATORY_CARE_PROVIDER_SITE_OTHER): Payer: Medicare Other | Admitting: Neurology

## 2019-07-07 ENCOUNTER — Other Ambulatory Visit: Payer: Self-pay

## 2019-07-07 ENCOUNTER — Other Ambulatory Visit: Payer: Self-pay | Admitting: *Deleted

## 2019-07-07 DIAGNOSIS — G5603 Carpal tunnel syndrome, bilateral upper limbs: Secondary | ICD-10-CM | POA: Diagnosis not present

## 2019-07-07 DIAGNOSIS — R202 Paresthesia of skin: Secondary | ICD-10-CM | POA: Diagnosis not present

## 2019-07-07 DIAGNOSIS — G43709 Chronic migraine without aura, not intractable, without status migrainosus: Secondary | ICD-10-CM | POA: Insufficient documentation

## 2019-07-07 DIAGNOSIS — IMO0002 Reserved for concepts with insufficient information to code with codable children: Secondary | ICD-10-CM

## 2019-07-07 DIAGNOSIS — R569 Unspecified convulsions: Secondary | ICD-10-CM

## 2019-07-07 DIAGNOSIS — M25532 Pain in left wrist: Secondary | ICD-10-CM

## 2019-07-07 DIAGNOSIS — M25531 Pain in right wrist: Secondary | ICD-10-CM

## 2019-07-07 DIAGNOSIS — G43909 Migraine, unspecified, not intractable, without status migrainosus: Secondary | ICD-10-CM | POA: Insufficient documentation

## 2019-07-07 HISTORY — DX: Carpal tunnel syndrome, bilateral upper limbs: G56.03

## 2019-07-07 MED ORDER — UBRELVY 50 MG PO TABS: 50.0000 mg | ORAL_TABLET | ORAL | 11 refills | Status: DC | PRN

## 2019-07-07 MED ORDER — VENLAFAXINE HCL ER 37.5 MG PO CP24: 37.5000 mg | ORAL_CAPSULE | Freq: Every day | ORAL | 11 refills | Status: DC

## 2019-07-07 NOTE — Procedures (Signed)
Full Name: [name] Gender: Female MRN #: [id number] Date of Birth: Feb 03, 1966    Visit Date: 07/07/2019 09:19 Age: 54 Years Examining Physician: Marcial Pacas, MD  Referring Physician: Marcial Pacas, MD History: 54 year old female presented with bilateral hands paresthesia. She also complains of neck pain, radiating pain to bilateral shoulder  Summary of the tests:  Nerve conduction study: Bilateral median sensory responses showed mild to moderately prolonged peak latency, right side has mildly decreased snap amplitude.   Bilateral median motor responses showed mildly prolonged distal latency.  Bilateral ulnar sensory motor responses were normal.  Electromyography:  Selected needle examination of bilateral upper extremity muscles, right cervical paraspinal muscles were performed.  The only abnormality is mildly decreased recruitment patterns at bilateral abductor pollicis brevis.   Conclusion: This is an abnormal study.  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  There is no evidence of right cervical radiculopathy.   ------------------------------- Marcial Pacas, M.D. PhD  [facility] [place], [place] [address] Tel: [phone] Fax: [phone]         [facility]    Nerve / Sites Muscle Latency Ref. Amplitude Ref. Rel Amp Segments Distance Velocity Ref. Area    ms ms mV mV %  cm m/s m/s mVms  L Median - APB     Wrist APB 4.6 ?4.4 9.6 ?4.0 100 Wrist - APB 7   45.3     Upper arm APB 8.8  9.1  94.7 Upper arm - Wrist 23 54 ?49 42.6  R Median - APB     Wrist APB 5.4 ?4.4 8.3 ?4.0 100 Wrist - APB 7   40.3     Upper arm APB 9.6  5.6  67 Upper arm - Wrist 23 54 ?49 26.9  L Ulnar - ADM     Wrist ADM 3.0 ?3.3 9.9 ?6.0 100 Wrist - ADM 7   38.3     B.Elbow ADM 6.7  7.6  76.2 B.Elbow -  Wrist 22 59 ?49 33.5     A.Elbow ADM 8.5  7.4  97.5 A.Elbow - B.Elbow 10 54 ?49 32.4         A.Elbow - Wrist      R Ulnar - ADM     Wrist ADM 2.9 ?3.3 9.6 ?6.0 100 Wrist - ADM 7   42.7     B.Elbow ADM 6.6  7.7  80.2 B.Elbow - Wrist 22 60 ?49 37.4     A.Elbow ADM 8.3  7.5  98.1 A.Elbow - B.Elbow 10 59 ?49 37.4         A.Elbow - Wrist                 SNC    Nerve / Sites Rec. Site Peak Lat Ref.  Amp Ref. Segments Distance    ms ms V V  cm  L Median - Orthodromic (Dig II, Mid palm)     Dig II Wrist 4.0 ?3.4 11 ?10 Dig II - Wrist 13  R Median - Orthodromic (Dig II, Mid palm)     Dig II Wrist 5.3 ?3.4 9 ?10 Dig II - Wrist 13  L Ulnar - Orthodromic, (Dig V, Mid palm)     Dig V Wrist 3.0 ?3.1 9 ?5 Dig V - Wrist 11  R Ulnar - Orthodromic, (Dig V, Mid  palm)     Dig V Wrist 2.6 ?3.1 14 ?5 Dig V - Wrist 64             F  Wave    Nerve F Lat Ref.   ms ms  L Ulnar - ADM 28.1 ?32.0  R Ulnar - ADM 28.1 ?32.0         EMG Summary Table    Spontaneous MUAP Recruitment  Muscle IA Fib PSW Fasc Other Amp Dur. Poly Pattern  R. Abductor pollicis brevis Normal None None None _______ Normal Normal Normal Reduced  R. Pronator teres Normal None None None _______ Normal Normal Normal Normal  R. Biceps brachii Normal None None None _______ Normal Normal Normal Normal  R. Deltoid Normal None None None _______ Normal Normal Normal Normal  R. Triceps brachii Normal None None None _______ Normal Normal Normal Normal  R. Extensor digitorum communis Normal None None None _______ Normal Normal Normal Normal  R. Cervical paraspinals Normal None None None _______ Normal Normal Normal Normal  L. Abductor pollicis brevis Normal None None None _______ Normal Normal Normal Reduced

## 2019-07-07 NOTE — Progress Notes (Signed)
PATIENT: [name] DOB: 1965/12/18  No chief complaint on file.    HISTORICAL  [name] is a 54 year old female, seen in refer by her primary care doctor Guadlupe Spanish for evaluation of right arm pain, initial evaluation was on June 12, 2017.  I reviewed and summarized the referring note, she has history of hypertension, hypothyroidism, coronary artery disease, the patient at [facility] weight loss program, candidate for bariatric surgery, BMI more than 50, she had a history of vagal nerve stimulator in the past,  I was able to review [facility] neurology visit by Dr. 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, based on the note, she is supposed to take Keppra 500 twice daily, Topamax 200 mg twice daily, reported seizure spells around the time of her menstruation, described seizure as dizziness spells, followed by room spinning, inability to move, then having generalized tonic-clonic body shaking, episodes last for a few minutes,  In 2014, she was treated with prolonged antibiotic status post vagal nerve stimulator pocket infection with Musser and Morganella morganii, she completed 4-week course of ertapenem, Dapto, and rifampin on August 19, 2012, was started on oral suppression with TMP/SMX on August 20, 2012, continued p.o. antibiotic without problems,  She also complains of pain and swelling of right arm, even went through cardiology evaluation that was determined not to be cardiac etiology,  She was seen by neurologist on Oct 23, 2012, EMG.NCS was normal in July 2014.  Today she continue complains of right arm swelling, right shoulder pain, radiating to right lateral arm, pronation and supination aggravate her right arm pain, intermittent weak grip  on the right-hand side, difficult to make a tight fist,  She also complains of mild neck pain, mild gait abnormality, denies bowel bladder incontinence.  Complains of frequent dizzy spells, she continue complains of seizure-like activity, most recent one was in June 09, 2017, she stares, she can feel it coming on, get confused, lasting for a few minutes,  She is only taking Topamax 100 mg 2 tablets twice daily, she has stopped the Keppra due to side effects,  UPDATE January 07 2018: She had her bariatric surgery on Oct 26 2017, works very well, she has lost 60 pounds  She presented to the emergency room on January 01, 2018, she was noted to have elevated blood pressure, up to 165/16, heart rate was 115 per patient, she did not feel good, when she is 25 up, bilateral lower extremity give out underneath normal underscore, she describes difficulty getting her words out, right arm weakness, right facial asymmetry, she was taken to the emergency room, CT head without contrast was normal, the whole episode lasted about 30 minutes,  Now she continue complains of right shoulder pain, limited range of motion of the right shoulder, gait abnormality  UPDATE May 08 2019: She complains of bilateral wrist pain since July 2020, left worse than right, difficulty using her left hand, also complains of intermittent bilateral hands paresthesia, she also complains of urinary urgency, neck pain, radiating to right shoulder and arm,  She supposed to take Topamax 200 mg twice daily, complains of increased seizure frequency since June 2020, every other day, last seizure was 2 days ago, she complains of dizzy, strange sensation trouble throughout her body, stuttering of the speech, she  could hear, but could not respond to surroundings, lasting for few minutes,  UPDATE Jul 07 2019: She continues to have recurrent spells of feeling dizziness, followed by staring spells, fidgety movement of bilateral hands, lasting for few  minutes, not able to respond to surroundings, it happened about 2-4 times each month, she has been taking Topamax regularly per patient  She complains of irritation with stimulation, has decreased output current from 1.25 to1, magnetic output current from 1.5 to 1.25  VNA setting on May 24, 2019:  Output Current (mA) 1 00 Signal Frequency (Hz) 20 Pulse Width (microsec) 250 Signal on Time (sec)  30 Signal off time (min) 5.0  Magnet Output current (mA) 1.25 Pulse Width (microsec) 250 Signal On Time (sec) 60  Model PULSE 102. Serial [id number] Implanted 2012-05-22.  Patient was not compliant with her Topamax, level was less than 1 in December 2020,  In addition, she complains of increased migraine headaches, daily basis, bilateral frontal headache, with associated light noise sensitivity, nausea, lasting for few hours, she has been taking Excedrin Migraine twice a day without helping, reported heart attack in the past, previously responded very well to Imitrex, but no longer a candidate for triptan  Today's EMG nerve conduction study confirmed moderate bilateral carpal tunnel syndrome, well explain her bilateral hands numbness, and painful sensation  REVIEW OF SYSTEMS: Full 14 system review of systems performed and notable only for as above  ALLERGIES: Allergies  Allergen Reactions  . Amitriptyline Swelling  . Amlodipine Swelling  . Latex Hives, Itching, Swelling and Rash    Burning also  . Amantadines Swelling  . Aspirin Nausea Only    Other reaction(s): Other (See Comments) Irritates stomach also/hx of stomach ulcers  . Simvastatin Swelling    Other reaction(s): Other (See Comments) pain Muscle pains also  . Tape Rash    Pt able to tolerate paper tape     HOME MEDICATIONS: Current Outpatient Medications  Medication Sig Dispense Refill  . acetaminophen (TYLENOL) 325 MG tablet Take 650 mg by mouth every 6 (six) hours as needed for headache.     . albuterol  (PROVENTIL HFA;VENTOLIN HFA) 108 (90 Base) MCG/ACT inhaler Inhale 2 puffs into the lungs every 6 (six) hours as needed for wheezing or shortness of breath. 1 Inhaler 6  . carvedilol (COREG) 25 MG tablet Take 25 mg by mouth 2 (two) times daily.  0  . diclofenac Sodium (VOLTAREN) 1 % GEL APPLY 4 GRAMS TOPICALLY 4 (FOUR) TIMES DAILY. 300 g 1  . diltiazem (CARDIZEM CD) 120 MG 24 hr capsule TAKE 1 CAPSULE BY MOUTH EVERY DAY 90 capsule 2  . furosemide (LASIX) 20 MG tablet Take 1 tablet (20 mg total) by mouth daily. 93 tablet 3  . gabapentin (NEURONTIN) 100 MG capsule Take 3 capsules (300 mg total) by mouth 2 (two) times daily. 270 capsule 0  . levothyroxine (SYNTHROID, LEVOTHROID) 50 MCG tablet Take 50 mcg by mouth daily.     Marland Kitchen lidocaine-prilocaine (EMLA) cream APPLY 1 APPLICATION TOPICALLY AS NEEDED. 30 g 0  . lisinopril (ZESTRIL) 20 MG tablet Take 1 tablet (20 mg total) by mouth daily. 90 tablet 1  . methocarbamol (ROBAXIN) 500 MG tablet Take 2 tablets (1,000 mg total) by mouth 4 (four) times daily. 30 tablet 0  . mometasone-formoterol (DULERA) 100-5 MCG/ACT AERO Inhale 2 puffs into the lungs 2 (two) times daily. 1 Inhaler 2  . Multiple Vitamins-Minerals (ADEKS PO) Take 1 tablet by mouth daily.    Marland Kitchen  naproxen (NAPROSYN) 375 MG tablet Take 1 tablet (375 mg total) by mouth 2 (two) times daily. 20 tablet 0  . nitroGLYCERIN (NITRODUR - DOSED IN MG/24 HR) 0.2 mg/hr patch PLACE 1 PATCH ONTO THE SKIN DAILY. 30 patch 1  . nitroGLYCERIN (NITROSTAT) 0.4 MG SL tablet PLACE 1 TAB UNDER TONGUE EVERY 5 MINUTES AS NEEDED FOR CHEST PAIN, MAX 3/15 MINS 25 tablet 6  . spironolactone (ALDACTONE) 25 MG tablet Take 1 tablet (25 mg total) by mouth daily. 90 tablet 3  . topiramate (TOPAMAX) 200 MG tablet Take 1 tablet (200 mg total) by mouth 2 (two) times daily. 180 tablet 4  . VITAMIN D, CHOLECALCIFEROL, PO Take 1 tablet by mouth daily.     No current facility-administered medications for this visit.    PAST MEDICAL  HISTORY: Past Medical History:  Diagnosis Date  . Asthma   . Biliary dyskinesia    a. s/p cholecystectomy.  Marland Kitchen BMI 40.0-44.9, adult ([place]) 06/11/2015  . 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 Abnl stress [place] 2008, f/u cath reportedly nl. b. ETT-Myoview 04/2011 - EKG changes but normal perfusion. Cor CT - no coronary calcium, no definite stenosis though mRCA not fully evaluated. c. 10/2011 - tn elevated in [place], LHC without CAD. Started on Ranexa, anti-anginals ?microvascular dz but later stopped while in hospital on abx.  . Complication of anesthesia    hard to wake up-had to be reminded to breath  . Diarrhea 08/09/2017  . GERD (gastroesophageal reflux disease)    a. Severe.  Marland Kitchen History of seizure 10/19/2017  . HTN (hypertension)   . Hx of cardiovascular stress test    Lex Myoview 8/14:  Normal, EF 74%  . Hx of echocardiogram    Echo 3/16:  Mild LVH, EF 55-60%, Gr 1 DD, trivial MR, mild LAE, normal RVF  . Hydradenitis 11/09/2016  . Hypothyroid   . Hypothyroidism 07/02/2013   Overview:  Last Assessment & Plan:   She reports a 21 pound increase in her weight since October. She doesn't know when the last time her TSH was checked so we will do that today.  . Low back pain 04/19/2016  . MI (myocardial infarction) ([place])   . Migraine   . Morbid obesity ([place]) 07/26/2017  . MRSA infection    a. After vagal nerve stimulator at [facility] - surgical site MRSA infection, PICC placed.  . Obesity   . OSA (obstructive sleep apnea) 07/26/2017  . Palpitations    a. 08/2014: 48 hour holter with 2 PVCs otherwise normal.  . Rectal pain 12/19/2017  . Seizure disorder ([place])    a. since childhood. b. s/p vagal nerve stimulator at [facility].  . Seizures ([place])     PAST SURGICAL HISTORY: Past Surgical History:  Procedure Laterality Date  . [place] N/A 06/01/2016   Procedure: Right Heart Cath;  Surgeon: Larey Dresser,  MD;  Location: [place] [facility];  Service: Cardiovascular;  Laterality: N/A;  . CESAREAN SECTION     placement of vagal nerve stimulator.  . CHOLECYSTECTOMY N/A 01/24/2013   Procedure: LAPAROSCOPIC CHOLECYSTECTOMY;  Surgeon: Harl Bowie, MD;  Location: [place];  Service: General;  Laterality: N/A;  . IMPLANTATION VAGAL NERVE STIMULATOR  [phone]   battery chg-[facility]    FAMILY HISTORY: Family History  Problem Relation Age of Onset  . Coronary artery disease Mother 32  .  Other Father        killed  . Kidney disease Father   . Coronary artery disease Maternal Grandmother   . Cancer Other   . Hypertension Other   . Stroke Other   . Breast cancer Neg Hx     SOCIAL HISTORY:  Social History   Socioeconomic History  . Marital status: Single    Spouse name: Not on file  . Number of children: 2  . Years of education: college  . Highest education level: Associate degree: academic program  Occupational History  . Occupation: Disabled  Tobacco Use  . Smoking status: Never Smoker  . Smokeless tobacco: Never Used  Substance and Sexual Activity  . Alcohol use: Yes    Alcohol/week: 1.0 - 3.0 standard drinks    Types: 1 - 3 Glasses of wine per week    Comment: occasional  . Drug use: No  . Sexual activity: Not on file  Other Topics Concern  . Not on file  Social History Narrative   Lives at home with her daughter.   Right-handed.   No caffeine per day.   Social Determinants of Health   Financial Resource Strain:   . Difficulty of Paying Living Expenses: Not on file  Food Insecurity:   . Worried About Charity fundraiser in the Last Year: Not on file  . Ran Out of Food in the Last Year: Not on file  Transportation Needs:   . Lack of Transportation (Medical): Not on file  . Lack of Transportation (Non-Medical): Not on file  Physical Activity:   . Days of Exercise per Week: Not on file  . Minutes of Exercise per Session: Not on file  Stress:     . Feeling of Stress : Not on file  Social Connections:   . Frequency of Communication with Friends and Family: Not on file  . Frequency of Social Gatherings with Friends and Family: Not on file  . Attends Religious Services: Not on file  . Active Member of Clubs or Organizations: Not on file  . Attends Archivist Meetings: Not on file  . Marital Status: Not on file  Intimate Partner Violence:   . Fear of Current or Ex-Partner: Not on file  . Emotionally Abused: Not on file  . Physically Abused: Not on file  . Sexually Abused: Not on file     PHYSICAL EXAM   There were no vitals filed for this visit.  Not recorded      There is no height or weight on file to calculate BMI.  PHYSICAL EXAMNIATION:  Gen: NAD, conversant, well nourised, obese, well groomed                     Cardiovascular: Regular rate rhythm, no peripheral edema, warm, nontender. Eyes: Conjunctivae clear without exudates or hemorrhage Neck: Supple, no carotid bruits. Pulmonary: Clear to auscultation bilaterally  Musculoskeletal: Left wrist pain upon deep palpitation  NEUROLOGICAL EXAM:  MENTAL STATUS: Speech:    Speech is normal; fluent and spontaneous with normal comprehension.  Cognition:     Orientation to time, place and person     Normal recent and remote memory     Normal Attention span and concentration     Normal Language, naming, repeating,spontaneous speech     Fund of knowledge   CRANIAL NERVES: CN II: Visual fields are full to confrontation.  Pupils are round equal and briskly reactive to light. CN III, IV, VI: extraocular  movement are normal. No ptosis. CN V: Facial sensation is intact to pinprick in all 3 divisions bilaterally. Corneal responses are intact.  CN VII: Face is symmetric with normal eye closure and smile. CN VIII: Hearing is normal to rubbing fingers CN IX, X: Palate elevates symmetrically. Phonation is normal. CN XI: Head turning and shoulder shrug are  intact CN XII: Tongue is midline with normal movements and no atrophy.  MOTOR: Right shoulder pain, limited range of motion, no significant weakness, variable effort on examinations,  REFLEXES: Reflexes are 2+ and symmetric at the biceps, triceps, knees, and ankles. Plantar responses are flexor.  SENSORY: Intact to light touch, pinprick, positional sensation and vibratory sensation are intact in fingers and toes.  COORDINATION: Rapid alternating movements and fine finger movements are intact. There is no dysmetria on finger-to-nose and heel-knee-shin.    GAIT/STANCE: She rely on her walker, cautious, mildly unsteady  DIAGNOSTIC DATA (LABS, IMAGING, TESTING) - I reviewed patient records, labs, notes, testing and imaging myself where available.   ASSESSMENT AND PLAN  [name] is a 54 y.o. female   Bilateral hand paresthesia,pain  Moderate bilateral carpal tunnel syndrome,   Worsening chronic migraine headaches  Add on Effexor XR 37.5 mg daily as preventive medications  Ubrelvy   50 mg as needed  Recurrent seizure-like spells  Continue Topamax 200 mg twice a day, but was not compliant with her Topamax  EEG was normal in December 2020  Check Topamax level  Current vagal nerve stimulation settings   Output Current (mA) 1.0 Signal Frequency (Hz) 20 Pulse Width (microsec) 250 Signal on Time (sec)  30 Signal off time (min) 5.0  Magnet Output current (mA) 1.25 Pulse Width (microsec) 250 Signal On Time (sec) 60  Model PULSE 102. Serial [id number] Implanted 2012-05-22.      Marcial Pacas, M.D. Ph.D.  [facility] [address] [place], [place] [address] Ph: [phone] Fax: [phone]  CC: Katherine Roan, MD

## 2019-07-08 LAB — TOPIRAMATE LEVEL: Topiramate Lvl: 2.4 ug/mL (ref 2.0–25.0)

## 2019-07-09 ENCOUNTER — Other Ambulatory Visit: Payer: Self-pay | Admitting: *Deleted

## 2019-07-09 MED ORDER — UBRELVY 50 MG PO TABS: 50.0000 mg | ORAL_TABLET | ORAL | 11 refills | Status: DC

## 2019-07-14 ENCOUNTER — Other Ambulatory Visit: Payer: Self-pay | Admitting: *Deleted

## 2019-07-14 MED ORDER — BUTALBITAL-APAP-CAFFEINE 50-325-40 MG PO TABS: ORAL_TABLET | ORAL | 5 refills | Status: DC

## 2019-07-14 NOTE — Telephone Encounter (Signed)
Received a fax from [place] notifying our office that Roselyn Meier is listed as a non-formulary medication on this patient's insurance plan.  PA started on covermymeds (key: [id number]). Pt has coverage w/ [employer] Medicare [phone]). [id number].  Decision pending.

## 2019-07-14 NOTE — Telephone Encounter (Signed)
She had a history of bariatric surgery in May 2019, not a candidate for NSAIDs,  For preventive medication, she has tried Topamax 200 mg twice a day Effexor 37.5 mg daily, gabapentin 300 mg twice a day, also on beta-blocker Coreg 25 mg twice a day,  For abortive treatment, options are Imitrex, Maxalt, Relpax, Zomig, Amerge, Frova,  Please ask her which triptan she has tried, we can try different triptan that she has not use, such as Imitrex injection, Maxalt dissolvable, Zomig nasal spray

## 2019-07-14 NOTE — Addendum Note (Signed)
Addended by: Desmond Lope on: 07/14/2019 05:38 PM   Modules accepted: Orders

## 2019-07-14 NOTE — Telephone Encounter (Addendum)
[employer] denied coverage for Ubrelvy. Under their plan, for some reason, Roselyn Meier is listed as a CGRP. Therefore, they will not cover it unless the patient has failed Aimovig and Emgality.  The patient will need an alternate rescue medication prescribed.

## 2019-07-14 NOTE — Telephone Encounter (Signed)
The patient reports history of heart attack. Per vo by Dr. Krista Blue, she will prescribe her Fioricet #10 per 30 days. The patient is agreeable to this medication.

## 2019-07-14 NOTE — Telephone Encounter (Signed)
Pt has called Sharyn Lull, RN back, please call

## 2019-07-14 NOTE — Telephone Encounter (Signed)
Left message requesting a call back.

## 2019-07-14 NOTE — Telephone Encounter (Signed)
1st rx printed and destroyed. 2nd sent to Dr.Yan to send to the pharmacy.

## 2019-07-14 NOTE — Addendum Note (Signed)
Addended by: Desmond Lope on: 07/14/2019 05:40 PM   Modules accepted: Orders

## 2019-07-15 MED ORDER — BUTALBITAL-APAP-CAFFEINE 50-325-40 MG PO TABS: ORAL_TABLET | ORAL | 5 refills | Status: DC

## 2019-07-20 DIAGNOSIS — R197 Diarrhea, unspecified: Secondary | ICD-10-CM | POA: Diagnosis not present

## 2019-07-20 DIAGNOSIS — Z48815 Encounter for surgical aftercare following surgery on the digestive system: Secondary | ICD-10-CM | POA: Diagnosis not present

## 2019-07-20 DIAGNOSIS — Z9884 Bariatric surgery status: Secondary | ICD-10-CM | POA: Diagnosis not present

## 2019-07-30 ENCOUNTER — Other Ambulatory Visit: Payer: Self-pay | Admitting: Neurology

## 2019-07-31 DIAGNOSIS — Z48815 Encounter for surgical aftercare following surgery on the digestive system: Secondary | ICD-10-CM | POA: Diagnosis not present

## 2019-07-31 DIAGNOSIS — Z9884 Bariatric surgery status: Secondary | ICD-10-CM | POA: Diagnosis not present

## 2019-08-07 DIAGNOSIS — F331 Major depressive disorder, recurrent, moderate: Secondary | ICD-10-CM | POA: Diagnosis not present

## 2019-08-07 DIAGNOSIS — F4310 Post-traumatic stress disorder, unspecified: Secondary | ICD-10-CM | POA: Diagnosis not present

## 2019-08-07 DIAGNOSIS — F411 Generalized anxiety disorder: Secondary | ICD-10-CM | POA: Diagnosis not present

## 2019-08-12 ENCOUNTER — Other Ambulatory Visit: Payer: Self-pay

## 2019-08-12 NOTE — Telephone Encounter (Signed)
levothyroxine (SYNTHROID, LEVOTHROID) 50 MCG tablet, REFILL REQUEST @ [facility] ON [address].

## 2019-08-13 DIAGNOSIS — B353 Tinea pedis: Secondary | ICD-10-CM | POA: Diagnosis not present

## 2019-08-13 DIAGNOSIS — L03032 Cellulitis of left toe: Secondary | ICD-10-CM | POA: Diagnosis not present

## 2019-08-13 DIAGNOSIS — B351 Tinea unguium: Secondary | ICD-10-CM | POA: Diagnosis not present

## 2019-08-13 DIAGNOSIS — L02612 Cutaneous abscess of left foot: Secondary | ICD-10-CM | POA: Diagnosis not present

## 2019-08-13 MED ORDER — LEVOTHYROXINE SODIUM 50 MCG PO TABS: 50.0000 ug | ORAL_TABLET | Freq: Every day | ORAL | 1 refills | Status: DC

## 2019-08-25 DIAGNOSIS — B351 Tinea unguium: Secondary | ICD-10-CM | POA: Diagnosis not present

## 2019-08-27 DIAGNOSIS — L03032 Cellulitis of left toe: Secondary | ICD-10-CM | POA: Diagnosis not present

## 2019-08-27 DIAGNOSIS — B353 Tinea pedis: Secondary | ICD-10-CM | POA: Diagnosis not present

## 2019-08-27 DIAGNOSIS — B351 Tinea unguium: Secondary | ICD-10-CM | POA: Diagnosis not present

## 2019-08-30 ENCOUNTER — Other Ambulatory Visit: Payer: Self-pay | Admitting: Internal Medicine

## 2019-09-08 ENCOUNTER — Ambulatory Visit (INDEPENDENT_AMBULATORY_CARE_PROVIDER_SITE_OTHER): Payer: Medicare Other | Admitting: Internal Medicine

## 2019-09-08 ENCOUNTER — Encounter: Payer: Self-pay | Admitting: Internal Medicine

## 2019-09-08 VITALS — BP 152/93 | HR 101 | Temp 98.4°F | Wt 168.8 lb

## 2019-09-08 DIAGNOSIS — M545 Low back pain: Secondary | ICD-10-CM

## 2019-09-08 DIAGNOSIS — I10 Essential (primary) hypertension: Secondary | ICD-10-CM

## 2019-09-08 DIAGNOSIS — G5603 Carpal tunnel syndrome, bilateral upper limbs: Secondary | ICD-10-CM | POA: Diagnosis not present

## 2019-09-08 DIAGNOSIS — Z79899 Other long term (current) drug therapy: Secondary | ICD-10-CM

## 2019-09-08 DIAGNOSIS — M5416 Radiculopathy, lumbar region: Secondary | ICD-10-CM | POA: Diagnosis not present

## 2019-09-08 NOTE — Patient Instructions (Signed)
[name], I have referred you to orthopedic surgery to address your bilateral carpal tunnel syndrome as well as your lower back pain.  Additionally, please return with all your blood pressure medications so we can sort out your bp meds.

## 2019-09-08 NOTE — Progress Notes (Signed)
CC: bilateral carpal tunnel syndrome, HTN, lumbar back pain   HPI:  [name] is a 54 y.o. female with PMH below.  Today we will address bilateral carpal tunnel syndrome, HTN, lumbar back pain   Please see A&P for status of the patient's chronic medical conditions  Past Medical History:  Diagnosis Date  . Asthma   . Biliary dyskinesia    a. s/p cholecystectomy.  Marland Kitchen BMI 40.0-44.9, adult ([place]) 06/11/2015  . 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 Abnl stress [place] 2008, f/u cath reportedly nl. b. ETT-Myoview 04/2011 - EKG changes but normal perfusion. Cor CT - no coronary calcium, no definite stenosis though mRCA not fully evaluated. c. 10/2011 - [place] in [place], LHC without CAD. Started on Ranexa, anti-anginals ?microvascular dz but later stopped while in hospital on abx.  . Complication of anesthesia    hard to wake up-had to be reminded to breath  . Diarrhea 08/09/2017  . GERD (gastroesophageal reflux disease)    a. Severe.  Marland Kitchen History of seizure 10/19/2017  . HTN (hypertension)   . Hx of cardiovascular stress test    Lex Myoview 8/14:  Normal, EF 74%  . Hx of echocardiogram    Echo 3/16:  Mild LVH, EF 55-60%, Gr 1 DD, trivial MR, mild LAE, normal RVF  . Hydradenitis 11/09/2016  . Hypothyroid   . Hypothyroidism 07/02/2013   Overview:  Last Assessment & Plan:   She reports a 21 pound increase in her weight since October. She doesn't know when the last time her TSH was checked so we will do that today.  . Low back pain 04/19/2016  . MI (myocardial infarction) ([place])   . Migraine   . Morbid obesity ([place]) 07/26/2017  . MRSA infection    a. After vagal nerve stimulator at [facility] - surgical site MRSA infection, PICC placed.  . Obesity   . OSA (obstructive sleep apnea) 07/26/2017  . Palpitations    a. 08/2014: 48 hour holter with 2 PVCs otherwise normal.  . Rectal pain 12/19/2017  . Seizure disorder ([place])    a. since childhood. b. s/p vagal nerve  stimulator at [facility].  . Seizures ([place])    Review of Systems:  ROS: Pulmonary: pt denies increased work of breathing, shortness of breath,  Cardiac: pt denies palpitations, chest pain,  Abdominal: pt denies abdominal pain, nausea, vomiting, or diarrhea   Physical Exam:  Vitals:   09/08/19 1433  BP: (!) 152/93  Pulse: (!) 101  Temp: 98.4 F (36.9 C)  TempSrc: Oral  SpO2: 100%  Weight: 168 lb 12.8 oz (76.6 kg)   Cardiac: JVD flat, normal rate and rhythm, clear s1 and s2, no murmurs, rubs or gallops, no LE edema Pulmonary: CTAB, not in distress Abdominal: non distended abdomen, soft and nontender Psych: Alert, conversant, in good spirits   Social History   Socioeconomic History  . Marital status: Single    Spouse name: Not on file  . Number of children: 2  . Years of education: college  . Highest education level: Associate degree: academic program  Occupational History  . Occupation: Disabled  Tobacco Use  . Smoking status: Never Smoker  . Smokeless tobacco: Never Used  Substance and Sexual Activity  . Alcohol use: Yes    Alcohol/week: 1.0 - 3.0 standard drinks    Types: 1 - 3 Glasses of wine per week    Comment: occasional  . Drug use: No  .  Sexual activity: Not on file  Other Topics Concern  . Not on file  Social History Narrative   Lives at home with her daughter.   Right-handed.   No caffeine per day.   Social Determinants of Health   Financial Resource Strain:   . Difficulty of Paying Living Expenses:   Food Insecurity:   . Worried About Charity fundraiser in the Last Year:   . Arboriculturist in the Last Year:   Transportation Needs:   . Film/video editor (Medical):   Marland Kitchen Lack of Transportation (Non-Medical):   Physical Activity:   . Days of Exercise per Week:   . Minutes of Exercise per Session:   Stress:   . Feeling of Stress :   Social Connections:   . Frequency of Communication with Friends and Family:   . Frequency of Social  Gatherings with Friends and Family:   . Attends Religious Services:   . Active Member of Clubs or Organizations:   . Attends Archivist Meetings:   Marland Kitchen Marital Status:   Intimate Partner Violence:   . Fear of Current or Ex-Partner:   . Emotionally Abused:   Marland Kitchen Physically Abused:   . Sexually Abused:     Family History  Problem Relation Age of Onset  . Coronary artery disease Mother 8  . Other Father        killed  . Kidney disease Father   . Coronary artery disease Maternal Grandmother   . Cancer Other   . Hypertension Other   . Stroke Other   . Breast cancer Neg Hx     Assessment & Plan:   See Encounters Tab for problem based charting.  Patient discussed with Dr. Lynnae January

## 2019-09-09 NOTE — Assessment & Plan Note (Signed)
  Bilateral Carpal Tunnel: She has one brace that she uses for her right hand where her symptoms are the worst.  She has had extensive workup which doesn't show any secondary causes such as inflammatory, MM, autoimmune, vitamin deficiencies.  Nerve conduction study showing signs of demyelination.    -will refer to orthopedic surgery for definitive management -she has been instructed she can purchase an OTC brace for the other arm in the mean time

## 2019-09-09 NOTE — Assessment & Plan Note (Signed)
HTN: she is not sure which meds she is currently taking, her cardiologist is prescribing some of her meds. She stopped taking some of her bp meds because she had a low blood pressure at home.  She says she is taking the carvedilol but that is the only one she is sure she's taking.  In our system I see that she last filled carvedilol in December.    -she will need to bring her meds to her next visit so we can do a thorough reconciliation and address her concerns, she agrees

## 2019-09-09 NOTE — Assessment & Plan Note (Signed)
Patient still having difficulty with lumbar back pain.  Her carpal tunnel seems to be bothering her the worst.  I asked her that she see if her orthopedic doctor can address her back pain issues with some possible injections.  If not I may need to refer her to neurosurgery or a PMR specialist for further management.    -referral to orthopedic surgery

## 2019-09-10 NOTE — Progress Notes (Signed)
Internal Medicine Clinic Attending  Case discussed with Dr. Winfrey  at the time of the visit.  We reviewed the resident's history and exam and pertinent patient test results.  I agree with the assessment, diagnosis, and plan of care documented in the resident's note.  

## 2019-09-11 DIAGNOSIS — Z48815 Encounter for surgical aftercare following surgery on the digestive system: Secondary | ICD-10-CM | POA: Diagnosis not present

## 2019-09-11 DIAGNOSIS — L03032 Cellulitis of left toe: Secondary | ICD-10-CM | POA: Diagnosis not present

## 2019-09-11 DIAGNOSIS — Z9884 Bariatric surgery status: Secondary | ICD-10-CM | POA: Diagnosis not present

## 2019-09-11 DIAGNOSIS — B353 Tinea pedis: Secondary | ICD-10-CM | POA: Diagnosis not present

## 2019-09-11 DIAGNOSIS — B351 Tinea unguium: Secondary | ICD-10-CM | POA: Diagnosis not present

## 2019-09-23 ENCOUNTER — Other Ambulatory Visit: Payer: Self-pay

## 2019-09-23 ENCOUNTER — Encounter: Payer: Self-pay | Admitting: Orthopaedic Surgery

## 2019-09-23 ENCOUNTER — Ambulatory Visit: Payer: Self-pay

## 2019-09-23 ENCOUNTER — Ambulatory Visit (INDEPENDENT_AMBULATORY_CARE_PROVIDER_SITE_OTHER): Payer: Medicare Other | Admitting: Orthopaedic Surgery

## 2019-09-23 VITALS — BP 128/82 | HR 90 | Ht 65.5 in | Wt 168.0 lb

## 2019-09-23 DIAGNOSIS — G8929 Other chronic pain: Secondary | ICD-10-CM

## 2019-09-23 DIAGNOSIS — M545 Low back pain, unspecified: Secondary | ICD-10-CM

## 2019-09-23 DIAGNOSIS — M542 Cervicalgia: Secondary | ICD-10-CM | POA: Diagnosis not present
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
## 2019-09-24 ENCOUNTER — Other Ambulatory Visit: Payer: Self-pay

## 2019-09-24 ENCOUNTER — Encounter (HOSPITAL_BASED_OUTPATIENT_CLINIC_OR_DEPARTMENT_OTHER): Payer: Self-pay | Admitting: Orthopaedic Surgery

## 2019-09-24 ENCOUNTER — Telehealth: Payer: Self-pay | Admitting: *Deleted

## 2019-09-24 NOTE — Telephone Encounter (Signed)
Appointment schedule with Dr Meda [place] on 05/05

## 2019-09-24 NOTE — Telephone Encounter (Signed)
   [facility] Pre-operative Risk Assessment    Request for surgical clearance:  1. What type of surgery is being performed? RIGHT CARPAL TUNNEL RELEASE    2. When is this surgery scheduled? TBD   3. What type of clearance is required (medical clearance vs. Pharmacy clearance to hold med vs. Both)? MEDICAL  4. Are there any medications that need to be held prior to surgery and how long? NONE LISTED    5. Practice name and name of physician performing surgery? [facility]; DR. Lorin Mercy   6. What is your office phone number [phone]    7.   What is your office fax number [phone]  8.   Anesthesia type (None, local, MAC, general) ? LOCAL STANDBY (MAC)   Julaine Hua 09/24/2019, 10:48 AM  _________________________________________________________________   (provider comments below)

## 2019-09-24 NOTE — Telephone Encounter (Signed)
Primary Cardiologist:Katarina Meda [facility], MD  Chart reviewed as part of pre-operative protocol coverage. Because of [name]'s past medical history and time since last visit, he/she will require a follow-up visit in order to better assess preoperative cardiovascular risk. She has not been seen since 09/2018, and that was a virtual visit.  She will need an appointment prior to clearance.   Pre-op covering staff: - Please schedule appointment and call patient to inform them. - Please contact requesting surgeon's office via preferred method (i.e, phone, fax) to inform them of need for appointment prior to surgery.  If applicable, this message will also be routed to pharmacy pool and/or primary cardiologist for input on holding anticoagulant/antiplatelet agent as requested below so that this information is available at time of patient's appointment.   Jory Sims, NP  09/24/2019, 11:09 AM

## 2019-09-25 ENCOUNTER — Other Ambulatory Visit (HOSPITAL_COMMUNITY): Payer: Medicare Other

## 2019-09-25 ENCOUNTER — Encounter: Payer: Self-pay | Admitting: *Deleted

## 2019-09-29 ENCOUNTER — Encounter (HOSPITAL_BASED_OUTPATIENT_CLINIC_OR_DEPARTMENT_OTHER): Payer: Self-pay

## 2019-09-29 ENCOUNTER — Ambulatory Visit (HOSPITAL_BASED_OUTPATIENT_CLINIC_OR_DEPARTMENT_OTHER): Admit: 2019-09-29 | Payer: Medicare Other | Admitting: Orthopaedic Surgery

## 2019-09-29 SURGERY — CARPAL TUNNEL RELEASE
Anesthesia: Monitor Anesthesia Care | Laterality: Right

## 2019-09-30 ENCOUNTER — Inpatient Hospital Stay: Payer: Medicare Other | Admitting: Orthopaedic Surgery

## 2019-10-06 ENCOUNTER — Encounter: Payer: Medicare Other | Admitting: Internal Medicine

## 2019-10-07 ENCOUNTER — Inpatient Hospital Stay: Payer: Medicare Other | Admitting: Orthopaedic Surgery

## 2019-10-07 DIAGNOSIS — B351 Tinea unguium: Secondary | ICD-10-CM | POA: Diagnosis not present

## 2019-10-07 DIAGNOSIS — M21611 Bunion of right foot: Secondary | ICD-10-CM | POA: Diagnosis not present

## 2019-10-07 DIAGNOSIS — M792 Neuralgia and neuritis, unspecified: Secondary | ICD-10-CM | POA: Diagnosis not present

## 2019-10-07 DIAGNOSIS — B353 Tinea pedis: Secondary | ICD-10-CM | POA: Diagnosis not present

## 2019-10-08 ENCOUNTER — Encounter: Payer: Self-pay | Admitting: Cardiology

## 2019-10-08 ENCOUNTER — Ambulatory Visit (INDEPENDENT_AMBULATORY_CARE_PROVIDER_SITE_OTHER): Payer: Medicare Other | Admitting: Cardiology

## 2019-10-08 VITALS — BP 150/80 | HR 92 | Ht 65.5 in | Wt 169.8 lb

## 2019-10-08 DIAGNOSIS — I10 Essential (primary) hypertension: Secondary | ICD-10-CM

## 2019-10-08 DIAGNOSIS — I5032 Chronic diastolic (congestive) heart failure: Secondary | ICD-10-CM

## 2019-10-08 DIAGNOSIS — Z01818 Encounter for other preprocedural examination: Secondary | ICD-10-CM | POA: Diagnosis not present

## 2019-10-08 MED ORDER — DILTIAZEM HCL ER COATED BEADS 120 MG PO CP24: 120.0000 mg | ORAL_CAPSULE | Freq: Every evening | ORAL | 2 refills | Status: DC

## 2019-10-08 NOTE — Telephone Encounter (Signed)
[name] can undergo carpal tunnel surgery, therapy currently no contraindication from cardiac standpoint.  The patient has no signs of angina or CHF.  Please call us with any further questions.

## 2019-10-08 NOTE — Patient Instructions (Signed)
Medication Instructions:   STOP TAKING FUROSEMIDE (LASIX) NOW  START TAKING YOUR CARDIZEM CD 120 MG BY MOUTH EVERY EVENING  *If you need a refill on your cardiac medications before your next appointment, please call your pharmacy*   Follow-Up:  3 MONTHS WITH DR. Meda [facility] IN PERSON

## 2019-10-08 NOTE — Progress Notes (Signed)
[facility] [place] Note   Evaluation Performed:  Follow-up visit  Date:  10/08/2019   ID:  [name], DOB 04-Nov-1965, MRN [id number]  Patient Location: Home  Provider Location: Home  PCP:  Katherine Roan, MD  Cardiologist:  Ena Dawley, MD  Electrophysiologist:  None   Chief Complaint: 1 year follow-up  History of Present Illness:    [name] is a 54 y.o. female is a 54 y/o AA female, previously followed by Dr. [name], who has been previously extensively evaluated for chest pain including abnormal stress test in 2008 that was followed by normal cardiac catheterization. Coronary CT angiogram (11/12): calcium score 0, difficult study with gating artifact, mid RCA nonevaluatable but no stenosis elsewhere in the coronaries. Echo (11/12): EF 55-60%, mild LV hypertrophy, mild MR. NSTEMI in [place] in 5/13 with elevated troponin but LHC showed normal coronaries.   10/08/2019, the patient comes after a year she looks absolutely fantastic, she underwent bariatric surgery and has lost 140 pounds.  She now monitors her healthy diet as well as frequent exercises that include swimming and walking and she feels great.  She occasionally gets chest pains but those are significantly improved compared to prior. She only complains that her blood pressure remains labile.  She takes all of her blood pressure medications in the morning after which she feels really tired sometimes even dizzy but no falls, and then on other occasions her blood pressure can be as high as 170.  She has no lower extremity edema no orthopnea or proximal nocturnal dyspnea.  Past Medical History:  Diagnosis Date  . Asthma   . Biliary dyskinesia    a. s/p cholecystectomy.  Marland Kitchen BMI 40.0-44.9, adult ([place]) 06/11/2015  . 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 Abnl stress [place] 2008, f/u cath reportedly nl. b. ETT-Myoview 04/2011 - EKG changes but normal perfusion. Cor CT - no coronary  calcium, no definite stenosis though mRCA not fully evaluated. c. 10/2011 - tn elevated in [place], LHC without CAD. Started on Ranexa, anti-anginals ?microvascular dz but later stopped while in hospital on abx.  . Complication of anesthesia    hard to wake up-had to be reminded to breath  . Diarrhea 08/09/2017  . GERD (gastroesophageal reflux disease)    a. Severe.  Marland Kitchen History of seizure 10/19/2017  . HTN (hypertension)   . Hx of cardiovascular stress test    Lex Myoview 8/14:  Normal, EF 74%  . Hx of echocardiogram    Echo 3/16:  Mild LVH, EF 55-60%, Gr 1 DD, trivial MR, mild LAE, normal RVF  . Hydradenitis 11/09/2016  . Hypothyroid   . Hypothyroidism 07/02/2013   Overview:  Last Assessment & Plan:   She reports a 21 pound increase in her weight since October. She doesn't know when the last time her TSH was checked so we will do that today.  . Low back pain 04/19/2016  . MI (myocardial infarction) ([place])   . Migraine   . Morbid obesity ([place]) 07/26/2017  . MRSA infection    a. After vagal nerve stimulator at [facility] - surgical site MRSA infection, PICC placed.  . Obesity   . OSA (obstructive sleep apnea) 07/26/2017  . Palpitations    a. 08/2014: 48 hour holter with 2 PVCs otherwise normal.  . Rectal pain 12/19/2017  . Seizure disorder ([place])    a. since childhood. b. s/p vagal nerve stimulator at [facility].  . Seizures ([place])  Past Surgical History:  Procedure Laterality Date  . [place] N/A 06/01/2016   Procedure: Right Heart Cath;  Surgeon: Larey Dresser, MD;  Location: [place] [facility];  Service: Cardiovascular;  Laterality: N/A;  . CESAREAN SECTION     placement of vagal nerve stimulator.  . CHOLECYSTECTOMY N/A 01/24/2013   Procedure: LAPAROSCOPIC CHOLECYSTECTOMY;  Surgeon: Harl Bowie, MD;  Location: [place];  Service: General;  Laterality: N/A;  . IMPLANTATION VAGAL NERVE STIMULATOR   [phone]   [facility]     Current Meds  Medication Sig  . acetaminophen (TYLENOL) 325 MG tablet Take 650 mg by mouth every 6 (six) hours as needed for headache.   . albuterol (PROVENTIL HFA;VENTOLIN HFA) 108 (90 Base) MCG/ACT inhaler Inhale 2 puffs into the lungs every 6 (six) hours as needed for wheezing or shortness of breath.  . butalbital-acetaminophen-caffeine (FIORICET) 50-325-40 MG tablet Take one tablet daily if needed for acute migraine. #10 per 30 days. No early refills.  . carvedilol (COREG) 25 MG tablet Take 25 mg by mouth 2 (two) times daily.  . diclofenac Sodium (VOLTAREN) 1 % GEL APPLY 4 GRAMS TOPICALLY 4 (FOUR) TIMES DAILY.  Marland Kitchen gabapentin (NEURONTIN) 100 MG capsule Take 100 mg by mouth at bedtime.  Marland Kitchen levothyroxine (SYNTHROID) 50 MCG tablet Take 1 tablet (50 mcg total) by mouth daily.  Marland Kitchen lidocaine-prilocaine (EMLA) cream APPLY 1 APPLICATION TOPICALLY AS NEEDED.  Marland Kitchen lisinopril (ZESTRIL) 20 MG tablet Take 1 tablet (20 mg total) by mouth daily.  . mometasone-formoterol (DULERA) 100-5 MCG/ACT AERO Inhale 2 puffs into the lungs 2 (two) times daily.  . montelukast (SINGULAIR) 10 MG tablet Take 10 mg by mouth at bedtime.  . Multiple Vitamins-Minerals (ADEKS PO) Take 1 tablet by mouth daily.  . nitroGLYCERIN (NITRODUR - DOSED IN MG/24 HR) 0.2 mg/hr patch PLACE 1 PATCH ONTO THE SKIN DAILY.  . nitroGLYCERIN (NITROSTAT) 0.4 MG SL tablet PLACE 1 TAB UNDER TONGUE EVERY 5 MINUTES AS NEEDED FOR CHEST PAIN, MAX 3/15 MINS  . spironolactone (ALDACTONE) 25 MG tablet Take 1 tablet (25 mg total) by mouth daily.  Marland Kitchen topiramate (TOPAMAX) 200 MG tablet Take 1 tablet (200 mg total) by mouth 2 (two) times daily.  . traZODone (DESYREL) 100 MG tablet Take 100 mg by mouth at bedtime.  Marland Kitchen Ubrogepant (UBRELVY) 50 MG TABS Take 50 mg by mouth as directed. Take 1 tab at onset of migraine. May repeat in 2 hrs, if needed. Max dose: 2 tabs/day. This is a 30 day prescription.  Marland Kitchen venlafaxine XR (EFFEXOR-XR)  37.5 MG 24 hr capsule TAKE 1 CAPSULE BY MOUTH DAILY WITH BREAKFAST.  Marland Kitchen VITAMIN D, CHOLECALCIFEROL, PO Take 1 tablet by mouth daily.  . [DISCONTINUED] diltiazem (CARDIZEM CD) 120 MG 24 hr capsule TAKE 1 CAPSULE BY MOUTH EVERY DAY  . [DISCONTINUED] furosemide (LASIX) 20 MG tablet Take 1 tablet (20 mg total) by mouth daily.     Allergies:   Amitriptyline, Amlodipine, Latex, Amantadines, Aspirin, Simvastatin, and Tape   Social History   Tobacco Use  . Smoking status: Never Smoker  . Smokeless tobacco: Never Used  Substance Use Topics  . Alcohol use: Yes    Alcohol/week: 1.0 - 3.0 standard drinks    Types: 1 - 3 Glasses of wine per week    Comment: occasional  . Drug use: No     Family Hx: The patient's family history includes Cancer  in an other family member; Coronary artery disease in her maternal grandmother; Coronary artery disease (age of onset: 79) in her mother; Hypertension in an other family member; Kidney disease in her father; Other in her father; Stroke in an other family member. There is no history of Breast cancer.  ROS:   Please see the history of present illness.     All other systems reviewed and are negative.   Prior CV studies:   The following studies were reviewed today:  [place] 05/2016 Procedures   Right Heart Cath  Conclusion   Normal right and left heart filling pressures, no pulmonary hypertension.  Normal cardiac output.   No changes.     [place] 07/2018 Study Highlights    The left ventricular ejection fraction is mildly decreased (45-54%).  Nuclear stress EF: 50%.  The study is normal.  This is a low risk study.   2D Echo 07/17/18  1. The left ventricle has low normal systolic function, with an ejection fraction of 50-55%. The cavity size was mildly dilated. Left ventricular diastolic Doppler parameters are consistent with impaired relaxation.  2. The right ventricle has normal systolic function. The cavity was normal. There is no increase in  right ventricular wall thickness.  3. The mitral valve is normal in structure.  4. The tricuspid valve is normal in structure.  5. The aortic valve is normal in structure.  6. The pulmonic valve was normal in structure.  7. Right atrial pressure is estimated at 3 mmHg.   Labs/Other Tests and Data Reviewed:    EKG:  An ECG dated 07/22/18 was personally reviewed today and demonstrated:  NSR, nonspecific inferior ST abnormalities  Recent Labs: 02/05/2019: TSH 1.300 06/02/2019: BUN 11; Creatinine, Ser 0.90; Hemoglobin 11.5; Platelets 176; Potassium 3.5; Sodium 140   Recent Lipid Panel Lab Results  Component Value Date/Time   CHOL 200 (H) 05/21/2017 11:11 AM   TRIG 58 05/21/2017 11:11 AM   HDL 59 05/21/2017 11:11 AM   CHOLHDL 3.4 05/21/2017 11:11 AM   CHOLHDL 3 09/30/2012 09:29 AM   LDLCALC 129 (H) 05/21/2017 11:11 AM    Wt Readings from Last 3 Encounters:  10/08/19 169 lb 12.8 oz (77 kg)  09/23/19 168 lb (76.2 kg)  09/08/19 168 lb 12.8 oz (76.6 kg)     Objective:    Vital Signs:  BP (!) 150/80   Pulse 92   Ht 5' 5.5" (1.664 m)   Wt 169 lb 12.8 oz (77 kg)   LMP 04/13/2018   SpO2 96%   BMI 27.83 kg/m    Well nourished, well developed female in no acute distress. Alert and oriented x3 Respirations appear normal.  No use of accessory muscles.  Breathing unlabored. Normal affect.    ASSESSMENT & PLAN:    Chest pain: Significantly improved with loss and regular activity.  Labile hypertension I will change her medications, she will continue taking all of her meds however she will take lisinopril in the morning and carvedilol at night spironolactone in the morning, and continue taking carvedilol twice daily.  Diastolic dysfunction: She is euvolemic, I will discontinue Lasix but continue spironolactone.  Obstructive sleep apnea: Prescribed CPAP but patient admits that she is not 100% compliant.  She often falls asleep while watching television and forgets to wear her CPAP  device.  We discussed the importance of treatment.  She will work on improving compliance.  Weight loss -the patient is congratulated on her efforts and encouraged to continue exercising on  a regular basis.  Her goal is to lose 20 more pounds.  Time:   Today, I have spent 15 minutes with the patient with telehealth technology discussing the above problems.     Medication Adjustments/Labs and Tests Ordered: Current medicines are reviewed at length with the patient today.  Concerns regarding medicines are outlined above.  Tests Ordered: Orders Placed This Encounter  Procedures  . EKG 12-Lead   Medication Changes: Meds ordered this encounter  Medications  . diltiazem (CARDIZEM CD) 120 MG 24 hr capsule    Sig: Take 1 capsule (120 mg total) by mouth every evening.    Dispense:  90 capsule    Refill:  2    Disposition:  Follow up in 1 year with Dr. Meda Coffee.  Signed, Ena Dawley, MD  10/08/2019 7:21 PM    [place]

## 2019-10-09 NOTE — Telephone Encounter (Signed)
   Primary Cardiologist: Ena Dawley, MD  Chart reviewed as part of pre-operative protocol coverage. Patient was contacted 10/09/2019 in reference to pre-operative risk assessment for pending surgery as outlined below.  SPARROW FEDAK was last seen on 5/5/212 by Dr. Meda Coffee.  She was doing well at this appt without any cardiac complaints. Per Dr. Meda Coffee she can undergo carpal tunnel surgery with no contraindication from a cardiac standpoint.   I will route this recommendation/office note to the requesting party via Epic fax function and remove from pre-op pool.  Please call with questions.  Reino Bellis, NP 10/09/2019, 7:37 AM

## 2019-10-13 ENCOUNTER — Encounter: Payer: Medicare Other | Admitting: Internal Medicine

## 2019-10-20 ENCOUNTER — Encounter: Payer: Medicare Other | Admitting: Internal Medicine

## 2019-10-23 DIAGNOSIS — M21611 Bunion of right foot: Secondary | ICD-10-CM | POA: Diagnosis not present

## 2019-10-23 DIAGNOSIS — M792 Neuralgia and neuritis, unspecified: Secondary | ICD-10-CM | POA: Diagnosis not present

## 2019-10-23 DIAGNOSIS — B351 Tinea unguium: Secondary | ICD-10-CM | POA: Diagnosis not present

## 2019-10-23 DIAGNOSIS — M21612 Bunion of left foot: Secondary | ICD-10-CM | POA: Diagnosis not present

## 2019-10-26 ENCOUNTER — Emergency Department (HOSPITAL_COMMUNITY)
Admission: EM | Admit: 2019-10-26 | Discharge: 2019-10-26 | Disposition: A | Discharge status: Home / Self Care | Payer: Medicare Other | Attending: Emergency Medicine | Admitting: Emergency Medicine

## 2019-10-26 ENCOUNTER — Other Ambulatory Visit: Payer: Self-pay

## 2019-10-26 ENCOUNTER — Other Ambulatory Visit: Payer: Self-pay | Admitting: Neurology

## 2019-10-26 ENCOUNTER — Encounter (HOSPITAL_COMMUNITY): Payer: Self-pay | Admitting: Emergency Medicine

## 2019-10-26 DIAGNOSIS — L292 Pruritus vulvae: Secondary | ICD-10-CM | POA: Insufficient documentation

## 2019-10-26 DIAGNOSIS — I5032 Chronic diastolic (congestive) heart failure: Secondary | ICD-10-CM | POA: Diagnosis not present

## 2019-10-26 DIAGNOSIS — Z79899 Other long term (current) drug therapy: Secondary | ICD-10-CM | POA: Diagnosis not present

## 2019-10-26 DIAGNOSIS — L290 Pruritus ani: Secondary | ICD-10-CM | POA: Insufficient documentation

## 2019-10-26 DIAGNOSIS — R319 Hematuria, unspecified: Secondary | ICD-10-CM | POA: Insufficient documentation

## 2019-10-26 DIAGNOSIS — N390 Urinary tract infection, site not specified: Secondary | ICD-10-CM

## 2019-10-26 DIAGNOSIS — I110 Hypertensive heart disease with heart failure: Secondary | ICD-10-CM | POA: Diagnosis not present

## 2019-10-26 DIAGNOSIS — Z91040 Latex allergy status: Secondary | ICD-10-CM | POA: Insufficient documentation

## 2019-10-26 DIAGNOSIS — R1033 Periumbilical pain: Secondary | ICD-10-CM | POA: Diagnosis present

## 2019-10-26 LAB — CBC
HCT: 43.4 % (ref 36.0–46.0)
Hemoglobin: 13.3 g/dL (ref 12.0–15.0)
MCH: 27.7 pg (ref 26.0–34.0)
MCHC: 30.6 g/dL (ref 30.0–36.0)
MCV: 90.2 fL (ref 80.0–100.0)
Platelets: 192 10*3/uL (ref 150–400)
RBC: 4.81 MIL/uL (ref 3.87–5.11)
RDW: 12.8 % (ref 11.5–15.5)
WBC: 4.9 10*3/uL (ref 4.0–10.5)
nRBC: 0 % (ref 0.0–0.2)

## 2019-10-26 LAB — COMPREHENSIVE METABOLIC PANEL
ALT: 33 U/L (ref 0–44)
AST: 19 U/L (ref 15–41)
Albumin: 4.2 g/dL (ref 3.5–5.0)
Alkaline Phosphatase: 108 U/L (ref 38–126)
Anion gap: 6 (ref 5–15)
BUN: 14 mg/dL (ref 6–20)
CO2: 26 mmol/L (ref 22–32)
Calcium: 8.8 mg/dL — ABNORMAL LOW (ref 8.9–10.3)
Chloride: 110 mmol/L (ref 98–111)
Creatinine, Ser: 0.72 mg/dL (ref 0.44–1.00)
GFR calc Af Amer: >60 mL/min (ref >60)
GFR calc non Af Amer: >60 mL/min (ref >60)
Glucose, Bld: 77 mg/dL (ref 70–99)
Potassium: 3.4 mmol/L — ABNORMAL LOW (ref 3.5–5.1)
Sodium: 142 mmol/L (ref 135–145)
Total Bilirubin: 0.3 mg/dL (ref 0.3–1.2)
Total Protein: 7.5 g/dL (ref 6.5–8.1)

## 2019-10-26 LAB — URINALYSIS, ROUTINE W REFLEX MICROSCOPIC
Bacteria, UA: NONE SEEN
Bilirubin Urine: NEGATIVE
Glucose, UA: NEGATIVE mg/dL
Ketones, ur: NEGATIVE mg/dL
Nitrite: NEGATIVE
Protein, ur: NEGATIVE mg/dL
Specific Gravity, Urine: 1.027 (ref 1.005–1.030)
pH: 5 (ref 5.0–8.0)

## 2019-10-26 LAB — WET PREP, GENITAL
Clue Cells Wet Prep HPF POC: NONE SEEN
Sperm: NONE SEEN
Trich, Wet Prep: NONE SEEN
Yeast Wet Prep HPF POC: NONE SEEN

## 2019-10-26 LAB — I-STAT BETA HCG BLOOD, ED (MC, WL, AP ONLY): I-stat hCG, quantitative: <5 m[IU]/mL (ref <5)

## 2019-10-26 LAB — LIPASE, BLOOD: Lipase: 32 U/L (ref 11–51)

## 2019-10-26 MED ORDER — FLUCONAZOLE 150 MG PO TABS
150.0000 mg | ORAL_TABLET | Freq: Every day | ORAL | 1 refills | Status: DC
Start: 2019-10-26 — End: 2020-06-25

## 2019-10-26 MED ORDER — CEPHALEXIN 500 MG PO CAPS: 500.0000 mg | ORAL_CAPSULE | Freq: Four times a day (QID) | ORAL | 0 refills | Status: AC

## 2019-10-26 MED ORDER — SODIUM CHLORIDE 0.9% FLUSH: 3.0000 mL | Freq: Once | INTRAVENOUS | Status: DC

## 2019-10-26 NOTE — ED Triage Notes (Signed)
C/o lower abd pain x 2-3 days.  Denies nausea, vomiting, and diarrhea.  Also reports vaginal and rectal itching.

## 2019-10-26 NOTE — Discharge Instructions (Signed)
Return if any problems.  Take antibiotics for uti.  Take fluconazole if symptoms of a yeast infection
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
## 2019-10-29 LAB — GC/CHLAMYDIA PROBE AMP (~~LOC~~) NOT AT ARMC
Chlamydia: NEGATIVE
Comment: NEGATIVE
Comment: NORMAL

## 2019-11-07 DIAGNOSIS — B351 Tinea unguium: Secondary | ICD-10-CM | POA: Diagnosis not present

## 2019-11-07 DIAGNOSIS — B353 Tinea pedis: Secondary | ICD-10-CM | POA: Diagnosis not present

## 2019-11-07 DIAGNOSIS — M792 Neuralgia and neuritis, unspecified: Secondary | ICD-10-CM | POA: Diagnosis not present

## 2019-11-17 ENCOUNTER — Ambulatory Visit (INDEPENDENT_AMBULATORY_CARE_PROVIDER_SITE_OTHER): Payer: Medicare Other | Admitting: Internal Medicine

## 2019-11-17 ENCOUNTER — Encounter: Payer: Self-pay | Admitting: Internal Medicine

## 2019-11-17 VITALS — BP 143/79 | HR 66 | Temp 98.1°F | Ht 65.0 in | Wt 170.4 lb

## 2019-11-17 DIAGNOSIS — M8949 Other hypertrophic osteoarthropathy, multiple sites: Secondary | ICD-10-CM | POA: Diagnosis not present

## 2019-11-17 DIAGNOSIS — M159 Polyosteoarthritis, unspecified: Secondary | ICD-10-CM

## 2019-11-17 MED ORDER — NAPROXEN 250 MG PO TABS: ORAL_TABLET | ORAL | 0 refills | Status: DC

## 2019-11-17 MED ORDER — GABAPENTIN 300 MG PO CAPS: 300.0000 mg | ORAL_CAPSULE | Freq: Every day | ORAL | 0 refills | Status: DC
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
## 2019-11-17 NOTE — Patient Instructions (Signed)
[name], I have ordered more gabapentin at a higher dosage 300mg  capsules.  Take 1 capsule at night and up to two capsules total for pain relief.  Additionally I have prescribed a course of naproxen for bad pain days.  Please call back orthopedic surgery to schedule your carpal tunnel release.  If you have any trouble please let us know and we can place another referral.  Please follow up in one month and bring your medications so we can adjust your bp medicine a month before your visit with Dr. Meda Coffee.
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
## 2019-11-18 NOTE — Progress Notes (Signed)
Internal Medicine Clinic Attending  Case discussed with Dr. Winfrey  at the time of the visit.  We reviewed the resident's history and exam and pertinent patient test results.  I agree with the assessment, diagnosis, and plan of care documented in the resident's note.  

## 2019-11-21 DIAGNOSIS — M792 Neuralgia and neuritis, unspecified: Secondary | ICD-10-CM | POA: Diagnosis not present

## 2019-11-21 DIAGNOSIS — B353 Tinea pedis: Secondary | ICD-10-CM | POA: Diagnosis not present

## 2019-11-21 DIAGNOSIS — B351 Tinea unguium: Secondary | ICD-10-CM | POA: Diagnosis not present

## 2019-11-24 ENCOUNTER — Telehealth: Payer: Self-pay

## 2019-11-24 NOTE — Telephone Encounter (Signed)
Requesting to speak with a nurse about pain  meds, please call pt back.  

## 2019-11-24 NOTE — Telephone Encounter (Signed)
Dr Shan Levans, pt calls and states she is in severe pain, states she cannot take naproxen because she is allergic to aspirin. From neck to ankles, pain is everywhere, average 9/10 She would like a narcotic pain medication.  May call her at [phone]

## 2019-11-25 ENCOUNTER — Telehealth: Payer: Self-pay | Admitting: *Deleted

## 2019-11-25 ENCOUNTER — Telehealth (INDEPENDENT_AMBULATORY_CARE_PROVIDER_SITE_OTHER): Payer: Medicare Other | Admitting: Cardiology

## 2019-11-25 ENCOUNTER — Other Ambulatory Visit: Payer: Self-pay

## 2019-11-25 ENCOUNTER — Encounter: Payer: Self-pay | Admitting: Cardiology

## 2019-11-25 ENCOUNTER — Ambulatory Visit (INDEPENDENT_AMBULATORY_CARE_PROVIDER_SITE_OTHER): Payer: Self-pay | Admitting: Internal Medicine

## 2019-11-25 VITALS — BP 152/108 | HR 69 | Ht 65.0 in | Wt 155.0 lb

## 2019-11-25 DIAGNOSIS — M8949 Other hypertrophic osteoarthropathy, multiple sites: Secondary | ICD-10-CM

## 2019-11-25 DIAGNOSIS — I10 Essential (primary) hypertension: Secondary | ICD-10-CM | POA: Diagnosis not present

## 2019-11-25 DIAGNOSIS — G4733 Obstructive sleep apnea (adult) (pediatric): Secondary | ICD-10-CM

## 2019-11-25 DIAGNOSIS — Z6831 Body mass index (BMI) 31.0-31.9, adult: Secondary | ICD-10-CM

## 2019-11-25 DIAGNOSIS — M159 Polyosteoarthritis, unspecified: Secondary | ICD-10-CM

## 2019-11-25 DIAGNOSIS — G5603 Carpal tunnel syndrome, bilateral upper limbs: Secondary | ICD-10-CM

## 2019-11-25 DIAGNOSIS — R52 Pain, unspecified: Secondary | ICD-10-CM

## 2019-11-25 DIAGNOSIS — E6609 Other obesity due to excess calories: Secondary | ICD-10-CM

## 2019-11-25 MED ORDER — MELOXICAM 7.5 MG PO TABS
7.5000 mg | ORAL_TABLET | Freq: Two times a day (BID) | ORAL | 0 refills | Status: DC | PRN
Start: 2019-11-25 — End: 2020-09-28

## 2019-11-25 MED ORDER — VENLAFAXINE HCL ER 75 MG PO CP24: 75.0000 mg | ORAL_CAPSULE | Freq: Every day | ORAL | 2 refills | Status: DC

## 2019-11-25 MED ORDER — GABAPENTIN 600 MG PO TABS: 600.0000 mg | ORAL_TABLET | Freq: Three times a day (TID) | ORAL | 2 refills | Status: DC
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
## 2019-11-25 NOTE — Telephone Encounter (Signed)
Patient is not actually "allergic" to aspirin.  If she is in that much pain everywhere she should go to the ED.  I do not think she is a good candidate for opioid therapy based on our conversations and her clinical history.  She has not rescheduled her carpal tunnel release procedure which is a significant source of her pain.

## 2019-11-25 NOTE — Patient Instructions (Signed)
[name],  It was a pleasure to see you today. Thank you for coming in.   Today we discussed your generalized pain. I am sorry that you are not feeling well. We will continue to treat you for the osteoarthirhitis and carpal tunnel syndrome, please start taking the Mobic medication when you are having pain. Please follow up with PT and the orthopedic surgeon.  There is also a concern that this could be fibromyalgia, we have increased the venlafaxine and the gabapentin to see if this helps. Please follow up with PT, this can help as well.    Please return to clinic in 1 month or sooner if needed.   Thank you again for coming in.   [name].D.

## 2019-11-25 NOTE — Telephone Encounter (Signed)
rtc to pt, lm for rtc 

## 2019-11-25 NOTE — Progress Notes (Signed)
Virtual Visit via Telephone Note   This visit type was conducted due to national recommendations for restrictions regarding the COVID-19 Pandemic (e.g. social distancing) in an effort to limit this patient's exposure and mitigate transmission in our community.  Due to her co-morbid illnesses, this patient is at least at moderate risk for complications without adequate follow up.  This format is felt to be most appropriate for this patient at this time.  The patient did not have access to video technology/had technical difficulties with video requiring transitioning to audio format only (telephone).  All issues noted in this document were discussed and addressed.  No physical exam could be performed with this format.  Please refer to the patient's chart for her  consent to telehealth for [facility].   Evaluation Performed:  Follow-up visit  This visit type was conducted due to national recommendations for restrictions regarding the COVID-19 Pandemic (e.g. social distancing).  This format is felt to be most appropriate for this patient at this time.  All issues noted in this document were discussed and addressed.  No physical exam was performed (except for noted visual exam findings with Video Visits).  Please refer to the patient's chart (MyChart message for video visits and phone note for telephone visits) for the patient's consent to telehealth for [facility]  Date:  11/25/2019   ID:  [name], DOB 03-05-1966, MRN [id number]  Patient Location:  Home  Provider location:   [address]  PCP:  Katherine Roan, MD  Cardiologist:  Ena Dawley, MD  Sleep Medicine:  Fransico Him, MD Electrophysiologist:  None   Chief Complaint:  OSA  History of Present Illness:    [name] is a 54 y.o. female who presents via audio/video conferencing for a telehealth visit today.    This is a 54yo female with a hx of chronic CP and CHF who is referred for evaluation of OSA.  She was  referred by Dr. Meda Coffee for sleep study due to CHF, excessive daytime sleepiness with an Epworth sleepiness score of 24,snoring and morning headaches.  She underwent PSG showing moderate OSA with an AHI of 21.9/hr with oxygen desaturations of 79%.  She underwent CPAP titration to 7cm H2O.    She is doing well with her CPAP device and thinks that she has gotten used to it.  She admits that she has not been very compliant recently in using her device.  She has had a lot of weight loss and now her mask does not fit.  She has lost over 100lbs but still feels like she needs the device.  She still catches herself stopping breathing which wakes her up.  SHe also has headaches in the am if she does not use the device.  During the day she feels fatigued and does not sleep well at night without her PAP device.   The patient does not have symptoms concerning for COVID-19 infection (fever, chills, cough, or new shortness of breath).    Prior CV studies:   The following studies were reviewed today:  PAP compliance download  Past Medical History:  Diagnosis Date  . Asthma   . Biliary dyskinesia    a. s/p cholecystectomy.  Marland Kitchen BMI 40.0-44.9, adult ([place]) 06/11/2015  . 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 Abnl stress [place] 2008, f/u cath reportedly nl. b. ETT-Myoview 04/2011 - EKG changes but normal perfusion. Cor CT - no coronary calcium, no definite stenosis though mRCA  not fully evaluated. c. 10/2011 - tn elevated in [place], LHC without CAD. Started on Ranexa, anti-anginals ?microvascular dz but later stopped while in hospital on abx.  . Complication of anesthesia    hard to wake up-had to be reminded to breath  . Diarrhea 08/09/2017  . GERD (gastroesophageal reflux disease)    a. Severe.  Marland Kitchen History of seizure 10/19/2017  . HTN (hypertension)   . Hx of cardiovascular stress test    [place] Myoview 8/14:  Normal, EF 74%  . Hx of echocardiogram    Echo 3/16:  Mild LVH, EF 55-60%, Gr 1 DD,  trivial MR, mild LAE, normal RVF  . Hydradenitis 11/09/2016  . Hypothyroid   . Hypothyroidism 07/02/2013   Overview:  Last Assessment & Plan:   She reports a 21 pound increase in her weight since October. She doesn't know when the last time her TSH was checked so we will do that today.  . Low back pain 04/19/2016  . MI (myocardial infarction) ([place])   . Migraine   . Morbid obesity ([place]) 07/26/2017  . MRSA infection    a. After vagal nerve stimulator at [facility] - surgical site MRSA infection, PICC placed.  . Obesity   . OSA (obstructive sleep apnea) 07/26/2017  . Palpitations    a. 08/2014: 48 hour holter with 2 PVCs otherwise normal.  . Rectal pain 12/19/2017  . Seizure disorder ([place])    a. since childhood. b. s/p vagal nerve stimulator at [facility].  . Seizures ([place])    Past Surgical History:  Procedure Laterality Date  . [place] N/A 06/01/2016   Procedure: Right Heart Cath;  Surgeon: Larey Dresser, MD;  Location: [place] [facility];  Service: Cardiovascular;  Laterality: N/A;  . CESAREAN SECTION     placement of vagal nerve stimulator.  . CHOLECYSTECTOMY N/A 01/24/2013   Procedure: LAPAROSCOPIC CHOLECYSTECTOMY;  Surgeon: Harl Bowie, MD;  Location: [facility];  Service: General;  Laterality: N/A;  . IMPLANTATION VAGAL NERVE STIMULATOR  [phone]   battery chg-[facility]     Current Meds  Medication Sig  . acetaminophen (TYLENOL) 325 MG tablet Take 650 mg by mouth every 6 (six) hours as needed for headache.   . albuterol (PROVENTIL HFA;VENTOLIN HFA) 108 (90 Base) MCG/ACT inhaler Inhale 2 puffs into the lungs every 6 (six) hours as needed for wheezing or shortness of breath.  . butalbital-acetaminophen-caffeine (FIORICET) 50-325-40 MG tablet Take one tablet daily if needed for acute migraine. #10 per 30 days. No early refills.  . carvedilol (COREG) 25 MG tablet Take 25 mg by mouth 2 (two) times  daily.  . diclofenac Sodium (VOLTAREN) 1 % GEL APPLY 4 GRAMS TOPICALLY 4 (FOUR) TIMES DAILY.  Marland Kitchen diltiazem (CARDIZEM CD) 120 MG 24 hr capsule Take 1 capsule (120 mg total) by mouth every evening.  . fluconazole (DIFLUCAN) 150 MG tablet Take 1 tablet (150 mg total) by mouth daily.  Marland Kitchen gabapentin (NEURONTIN) 300 MG capsule Take 1-2 capsules (300-600 mg total) by mouth at bedtime.  Marland Kitchen levothyroxine (SYNTHROID) 50 MCG tablet Take 1 tablet (50 mcg total) by mouth daily.  Marland Kitchen lidocaine-prilocaine (EMLA) cream APPLY 1 APPLICATION TOPICALLY AS NEEDED.  Marland Kitchen lisinopril (ZESTRIL) 20 MG tablet Take 1 tablet (20 mg total) by mouth daily.  . mometasone-formoterol (DULERA) 100-5 MCG/ACT AERO Inhale 2 puffs into the lungs 2 (two) times daily.  . montelukast (SINGULAIR) 10 MG  tablet Take 10 mg by mouth at bedtime.  . Multiple Vitamins-Minerals (ADEKS PO) Take 1 tablet by mouth daily.  . nitroGLYCERIN (NITRODUR - DOSED IN MG/24 HR) 0.2 mg/hr patch PLACE 1 PATCH ONTO THE SKIN DAILY.  . nitroGLYCERIN (NITROSTAT) 0.4 MG SL tablet PLACE 1 TAB UNDER TONGUE EVERY 5 MINUTES AS NEEDED FOR CHEST PAIN, MAX 3/15 MINS  . spironolactone (ALDACTONE) 25 MG tablet Take 1 tablet (25 mg total) by mouth daily.  Marland Kitchen topiramate (TOPAMAX) 200 MG tablet Take 1 tablet (200 mg total) by mouth 2 (two) times daily.  . traZODone (DESYREL) 100 MG tablet Take 100 mg by mouth at bedtime.  Marland Kitchen Ubrogepant (UBRELVY) 50 MG TABS Take 50 mg by mouth as directed. Take 1 tab at onset of migraine. May repeat in 2 hrs, if needed. Max dose: 2 tabs/day. This is a 30 day prescription.  Marland Kitchen venlafaxine XR (EFFEXOR-XR) 37.5 MG 24 hr capsule TAKE 1 CAPSULE BY MOUTH DAILY WITH BREAKFAST.  Marland Kitchen VITAMIN D, CHOLECALCIFEROL, PO Take 1 tablet by mouth once a week.      Allergies:   Amitriptyline, Amlodipine, Latex, Amantadines, Aspirin, Simvastatin, and Tape   Social History   Tobacco Use  . Smoking status: Never Smoker  . Smokeless tobacco: Never Used  Vaping Use  .  Vaping Use: Never used  Substance Use Topics  . Alcohol use: Yes    Alcohol/week: 1.0 - 3.0 standard drink    Types: 1 - 3 Glasses of wine per week    Comment: occasional  . Drug use: No     Family Hx: The patient's family history includes Cancer in an other family member; Coronary artery disease in her maternal grandmother; Coronary artery disease (age of onset: 1) in her mother; Hypertension in an other family member; Kidney disease in her father; Other in her father; Stroke in an other family member. There is no history of Breast cancer.  ROS:   Please see the history of present illness.     All other systems reviewed and are negative.   Labs/Other Tests and Data Reviewed:    Recent Labs: 02/05/2019: TSH 1.300 10/26/2019: ALT 33; BUN 14; Creatinine, Ser 0.72; Hemoglobin 13.3; Platelets 192; Potassium 3.4; Sodium 142   Recent Lipid Panel Lab Results  Component Value Date/Time   CHOL 200 (H) 05/21/2017 11:11 AM   TRIG 58 05/21/2017 11:11 AM   HDL 59 05/21/2017 11:11 AM   CHOLHDL 3.4 05/21/2017 11:11 AM   CHOLHDL 3 09/30/2012 09:29 AM   LDLCALC 129 (H) 05/21/2017 11:11 AM    Wt Readings from Last 3 Encounters:  11/25/19 155 lb (70.3 kg)  11/17/19 170 lb 6.4 oz (77.3 kg)  10/26/19 165 lb (74.8 kg)     Objective:    Vital Signs:  BP (!) 152/108   Pulse 69   Ht 5\' 5"  (1.651 m)   Wt 155 lb (70.3 kg)   LMP 04/13/2018   BMI 25.79 kg/m      ASSESSMENT & PLAN:    1.  OSA -  The patient is tolerating PAP therapy but has lost a lot of weight and her mask is not fitting anymore and struggles at night with the mask due to not sealing.  She still feels tired during the day has am HAs when she does not use her device.  She wakes herself up gasping for breath still and sleeps poorly at night when she does not use her PAP despite the weight loss.  -I  have recommended referral back to DME to get a new mask fitting done -I will repeat a download and followup with her in 2 months  to see how she is doing after getting a new mask that fits appropriately. -continue on CPAP 7cm H2O .   2.  HTN -BP controlled  -continue Cardizem CD 120mg  daily, Carvedilol 25mg  BID, Lisinopril 20mg  dialy and spiro 25mg  daily  3.  Obesity -she has lost over 100lbs and is no longer in the obese category   COVID-19 Education: The signs and symptoms of COVID-19 were discussed with the patient and how to seek care for testing (follow up with PCP or arrange E-visit).  The importance of social distancing was discussed today.  Patient Risk:   After full review of this patient's clinical status, I feel that they are at least moderate risk at this time.  Time:   Today, I have spent 20 minutes on telemedicine discussing medical problems including OSA, HTN, obesity and reviewing patient's chart including PAP compliance download.  Medication Adjustments/Labs and Tests Ordered: Current medicines are reviewed at length with the patient today.  Concerns regarding medicines are outlined above.  Tests Ordered: No orders of the defined types were placed in this encounter.  Medication Changes: No orders of the defined types were placed in this encounter.   Disposition:  Follow up in 1 year(s)  Signed, Fransico Him, MD  11/25/2019 9:24 AM    [facility]

## 2019-11-25 NOTE — Telephone Encounter (Addendum)
-----   Message from Sueanne Margarita, MD sent at 11/25/2019  9:33 AM EDT ----- Please set up OV with DME to get a new mask fitting. She has lost > 100lbs and her mask does not fit and therefore has not been compliant with PAP therapy.  Followup with me in 2 months  Order placed to [facility] via community message.

## 2019-11-25 NOTE — Telephone Encounter (Signed)
appt today [facility] at 1445 for pain, see note from 6/21

## 2019-11-26 DIAGNOSIS — M797 Fibromyalgia: Secondary | ICD-10-CM | POA: Insufficient documentation
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
## 2019-11-28 NOTE — Progress Notes (Signed)
Internal Medicine Clinic Attending  Case discussed with Dr. Krienke at the time of the visit.  We reviewed the resident's history and exam and pertinent patient test results.  I agree with the assessment, diagnosis, and plan of care documented in the resident's note.    

## 2019-12-02 ENCOUNTER — Other Ambulatory Visit: Payer: Self-pay | Admitting: Internal Medicine

## 2019-12-02 DIAGNOSIS — M25532 Pain in left wrist: Secondary | ICD-10-CM

## 2019-12-02 DIAGNOSIS — M25531 Pain in right wrist: Secondary | ICD-10-CM

## 2019-12-02 DIAGNOSIS — M79641 Pain in right hand: Secondary | ICD-10-CM

## 2019-12-02 DIAGNOSIS — M79642 Pain in left hand: Secondary | ICD-10-CM

## 2019-12-04 ENCOUNTER — Ambulatory Visit: Payer: Medicare Other | Attending: Internal Medicine | Admitting: Physical Therapy

## 2019-12-04 ENCOUNTER — Other Ambulatory Visit: Payer: Self-pay

## 2019-12-04 DIAGNOSIS — M546 Pain in thoracic spine: Secondary | ICD-10-CM | POA: Insufficient documentation

## 2019-12-04 DIAGNOSIS — M5416 Radiculopathy, lumbar region: Secondary | ICD-10-CM | POA: Diagnosis not present

## 2019-12-04 DIAGNOSIS — M542 Cervicalgia: Secondary | ICD-10-CM | POA: Diagnosis not present

## 2019-12-04 DIAGNOSIS — M62830 Muscle spasm of back: Secondary | ICD-10-CM | POA: Insufficient documentation

## 2019-12-04 DIAGNOSIS — M6281 Muscle weakness (generalized): Secondary | ICD-10-CM

## 2019-12-04 DIAGNOSIS — M545 Low back pain, unspecified: Secondary | ICD-10-CM

## 2019-12-04 DIAGNOSIS — G8929 Other chronic pain: Secondary | ICD-10-CM | POA: Insufficient documentation

## 2019-12-04 DIAGNOSIS — R262 Difficulty in walking, not elsewhere classified: Secondary | ICD-10-CM

## 2019-12-04 NOTE — Patient Instructions (Signed)
Access Code: [email or web address] URL: [email or web address] Date: 12/04/2019 Prepared by: Estill Bamberg April Thurnell Garbe  Exercises Prone Hip Extension - 1 x daily - 7 x weekly - 2 sets - 10 reps Child's Pose Stretch - 1 x daily - 7 x weekly - 3 sets - 30 sec hold Child's Pose with Sidebending - 1 x daily - 7 x weekly - 3 sets - 30 sec hold Child's Pose with Thread the Needle - 1 x daily - 7 x weekly - 3 sets - 30 sec hold Sternocleidomastoid Stretch - 1 x daily - 7 x weekly - 3 sets - 30 sec hold Standing Cervical Retraction - 1 x daily - 7 x weekly - 3 sets - 10 reps Seated Scapular Retraction - 1 x daily - 7 x weekly - 3 sets - 10 reps

## 2019-12-05 ENCOUNTER — Ambulatory Visit (INDEPENDENT_AMBULATORY_CARE_PROVIDER_SITE_OTHER): Payer: Medicare Other | Admitting: Otolaryngology

## 2019-12-05 ENCOUNTER — Encounter (INDEPENDENT_AMBULATORY_CARE_PROVIDER_SITE_OTHER): Payer: Self-pay | Admitting: Otolaryngology

## 2019-12-05 VITALS — Temp 97.2°F

## 2019-12-05 DIAGNOSIS — H6122 Impacted cerumen, left ear: Secondary | ICD-10-CM

## 2019-12-05 DIAGNOSIS — H903 Sensorineural hearing loss, bilateral: Secondary | ICD-10-CM | POA: Diagnosis not present

## 2019-12-05 DIAGNOSIS — H9203 Otalgia, bilateral: Secondary | ICD-10-CM | POA: Diagnosis not present

## 2019-12-05 NOTE — Addendum Note (Signed)
Addended by: Colbert Ewing MARIE L on: 12/05/2019 08:05 AM   Modules accepted: Orders

## 2019-12-05 NOTE — Progress Notes (Signed)
HPI: [name] is a 54 y.o. female who presents for evaluation of ear pain and discomfort which is generally worse on the left side.  This started about a year ago when she felt like something went into her ear and at times she feels like something is crawling in her ear.  She has been seen at urgent care with clear ear evaluation.  She has noticed some decrease in her hearing.  She has had no drainage from the ears.  However when she gets air or wind in the ears it is uncomfortable. She has no pain or discomfort with eating chewing or swallowing although she has had occasional sore throat.  Past Medical History:  Diagnosis Date  . Asthma   . Biliary dyskinesia    a. s/p cholecystectomy.  Marland Kitchen BMI 40.0-44.9, adult ([place]) 06/11/2015  . 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 Abnl stress [place] 2008, f/u cath reportedly nl. b. ETT-Myoview 04/2011 - EKG changes but normal perfusion. Cor CT - no coronary calcium, no definite stenosis though mRCA not fully evaluated. c. 10/2011 - tn elevated in [place], LHC without CAD. Started on Ranexa, anti-anginals ?microvascular dz but later stopped while in hospital on abx.  . Complication of anesthesia    hard to wake up-had to be reminded to breath  . Diarrhea 08/09/2017  . GERD (gastroesophageal reflux disease)    a. Severe.  Marland Kitchen History of seizure 10/19/2017  . HTN (hypertension)   . Hx of cardiovascular stress test    Lex Myoview 8/14:  Normal, EF 74%  . Hx of echocardiogram    Echo 3/16:  Mild LVH, EF 55-60%, Gr 1 DD, trivial MR, mild LAE, normal RVF  . Hydradenitis 11/09/2016  . Hypothyroid   . Hypothyroidism 07/02/2013   Overview:  Last Assessment & Plan:   She reports a 21 pound increase in her weight since October. She doesn't know when the last time her TSH was checked so we will do that today.  . Low back pain 04/19/2016  . MI (myocardial infarction) ([place])   . Migraine   . Morbid obesity ([place]) 07/26/2017  . MRSA infection    a. After  vagal nerve stimulator at [facility] - surgical site MRSA infection, PICC placed.  . Obesity   . OSA (obstructive sleep apnea) 07/26/2017  . Palpitations    a. 08/2014: 48 hour holter with 2 PVCs otherwise normal.  . Rectal pain 12/19/2017  . Seizure disorder ([place])    a. since childhood. b. s/p vagal nerve stimulator at [facility].  . Seizures ([place])    Past Surgical History:  Procedure Laterality Date  . [place] N/A 06/01/2016   Procedure: Right Heart Cath;  Surgeon: Larey Dresser, MD;  Location: [facility];  Service: Cardiovascular;  Laterality: N/A;  . CESAREAN SECTION     placement of vagal nerve stimulator.  . CHOLECYSTECTOMY N/A 01/24/2013   Procedure: LAPAROSCOPIC CHOLECYSTECTOMY;  Surgeon: Harl Bowie, MD;  Location: [facility];  Service: General;  Laterality: N/A;  . IMPLANTATION VAGAL NERVE STIMULATOR  [phone]   [facility]   Social History   Socioeconomic History  . Marital status: Single    Spouse name: Not on file  . Number of children: 2  . Years of education: college  . Highest education level: Associate degree: academic program  Occupational History  . Occupation:  Disabled  Tobacco Use  . Smoking status: Never Smoker  . Smokeless tobacco: Never Used  Vaping Use  . Vaping Use: Never used  Substance and Sexual Activity  . Alcohol use: Yes    Alcohol/week: 1.0 - 3.0 standard drink    Types: 1 - 3 Glasses of wine per week    Comment: occasional  . Drug use: No  . Sexual activity: Not on file  Other Topics Concern  . Not on file  Social History Narrative   Lives at home with her daughter.   Right-handed.   No caffeine per day.   Social Determinants of Health   Financial Resource Strain:   . Difficulty of Paying Living Expenses:   Food Insecurity:   . Worried About Charity fundraiser in the Last Year:   . Arboriculturist in the Last Year:    Transportation Needs:   . Film/video editor (Medical):   Marland Kitchen Lack of Transportation (Non-Medical):   Physical Activity:   . Days of Exercise per Week:   . Minutes of Exercise per Session:   Stress:   . Feeling of Stress :   Social Connections:   . Frequency of Communication with Friends and Family:   . Frequency of Social Gatherings with Friends and Family:   . Attends Religious Services:   . Active Member of Clubs or Organizations:   . Attends Archivist Meetings:   Marland Kitchen Marital Status:    Family History  Problem Relation Age of Onset  . Coronary artery disease Mother 18  . Other Father        killed  . Kidney disease Father   . Coronary artery disease Maternal Grandmother   . Cancer Other   . Hypertension Other   . Stroke Other   . Breast cancer Neg Hx    Allergies  Allergen Reactions  . Amitriptyline Swelling  . Amlodipine Swelling  . Latex Hives, Itching, Swelling and Rash    Burning also  . Amantadines Swelling  . Aspirin Nausea Only    Other reaction(s): Other (See Comments) Irritates stomach also/hx of stomach ulcers  . Simvastatin Swelling    Other reaction(s): Other (See Comments) pain Muscle pains also  . Tape Rash    Pt able to tolerate paper tape    Prior to Admission medications   Medication Sig Start Date End Date Taking? Authorizing Provider  acetaminophen (TYLENOL) 325 MG tablet Take 650 mg by mouth every 6 (six) hours as needed for headache.    Yes [provider]  albuterol (PROVENTIL HFA;VENTOLIN HFA) 108 (90 Base) MCG/ACT inhaler Inhale 2 puffs into the lungs every 6 (six) hours as needed for wheezing or shortness of breath. 09/29/16  Yes Dorothy Spark, MD  butalbital-acetaminophen-caffeine (FIORICET) [phone] MG tablet Take one tablet daily if needed for acute migraine. #10 per 30 days. No early refills. 07/15/19  Yes Marcial Pacas, MD  carvedilol (COREG) 25 MG tablet Take 25 mg by mouth 2 (two) times daily. 07/27/17  Yes  [provider]  diclofenac Sodium (VOLTAREN) 1 % GEL APPLY 4 GRAMS TOPICALLY 4 (FOUR) TIMES DAILY. 09/01/19  Yes Katherine Roan, MD  diltiazem (CARDIZEM CD) 120 MG 24 hr capsule Take 1 capsule (120 mg total) by mouth every evening. 10/08/19  Yes Dorothy Spark, MD  fluconazole (DIFLUCAN) 150 MG tablet Take 1 tablet (150 mg total) by mouth daily. 10/26/19  Yes Caryl Ada K, PA-C  gabapentin (NEURONTIN) 600  MG tablet Take 1 tablet (600 mg total) by mouth 3 (three) times daily. 11/25/19  Yes [name], MD  levothyroxine (SYNTHROID) 50 MCG tablet Take 1 tablet (50 mcg total) by mouth daily. 08/13/19  Yes Katherine Roan, MD  lidocaine-prilocaine (EMLA) cream APPLY 1 APPLICATION TOPICALLY AS NEEDED. 12/02/19  Yes Velna Ochs, MD  lisinopril (ZESTRIL) 20 MG tablet Take 1 tablet (20 mg total) by mouth daily. 05/26/19 05/20/20 Yes Dorothy Spark, MD  meloxicam (MOBIC) 7.5 MG tablet Take 1 tablet (7.5 mg total) by mouth 2 (two) times daily as needed for pain. 11/25/19 11/24/20 Yes [name], MD  mometasone-formoterol [facility]) 100-5 MCG/ACT AERO Inhale 2 puffs into the lungs 2 (two) times daily. 09/26/16  Yes Dorothy Spark, MD  montelukast (SINGULAIR) 10 MG tablet Take 10 mg by mouth at bedtime.   Yes [provider]  Multiple Vitamins-Minerals (ADEKS PO) Take 1 tablet by mouth daily.   Yes [provider]  nitroGLYCERIN (NITRODUR - DOSED IN MG/24 HR) 0.2 mg/hr patch PLACE 1 PATCH ONTO THE SKIN DAILY. 09/26/16  Yes Dorothy Spark, MD  nitroGLYCERIN (NITROSTAT) 0.4 MG SL tablet PLACE 1 TAB UNDER TONGUE EVERY 5 MINUTES AS NEEDED FOR CHEST PAIN, MAX 3/15 MINS 06/04/18  Yes Dorothy Spark, MD  spironolactone (ALDACTONE) 25 MG tablet Take 1 tablet (25 mg total) by mouth daily. 07/22/18  Yes Daune Perch, NP  topiramate (TOPAMAX) 200 MG tablet Take 1 tablet (200 mg total) by mouth 2 (two) times daily. 05/08/19  Yes Marcial Pacas, MD  traZODone  (DESYREL) 100 MG tablet Take 100 mg by mouth at bedtime.   Yes [provider]  Ubrogepant (UBRELVY) 50 MG TABS Take 50 mg by mouth as directed. Take 1 tab at onset of migraine. May repeat in 2 hrs, if needed. Max dose: 2 tabs/day. This is a 30 day prescription. 07/09/19  Yes Marcial Pacas, MD  venlafaxine XR (EFFEXOR-XR) 75 MG 24 hr capsule Take 1 capsule (75 mg total) by mouth daily with breakfast. 11/25/19  Yes [name], MD  VITAMIN D, CHOLECALCIFEROL, PO Take 1 tablet by mouth once a week.    Yes [provider]     Positive ROS: Otherwise negative  All other systems have been reviewed and were otherwise negative with the exception of those mentioned in the HPI and as above.  Physical Exam: Constitutional: Alert, well-appearing, no acute distress Ears: External ears without lesions or tenderness. Ear canals with minimal wax buildup on both sides slightly worse on the left.  This was cleaned with a curette and patient experienced discomfort with cleaning the wax from the ear canal although the ear canal was normal to examination otherwise.  On microscopic exam both TMs were clear with good mobility on pneumatic otoscopy.  I did apply a light dusting of CSF powder to the left ear canal. Nasal: External nose without lesions.. Clear nasal passages Oral: Lips and gums without lesions. Tongue and palate mucosa without lesions. Posterior oropharynx clear. Neck: No palpable adenopathy or masses.  Palpation of the TMJ area was benign bilaterally with no palpable pain or discomfort.  No adenopathy or swelling in the neck. Respiratory: Breathing comfortably  Skin: No facial/neck lesions or rash noted.  Cerumen impaction removal  Date/Time: 12/05/2019 2:17 PM Performed by: Rozetta Nunnery, MD Authorized by: Rozetta Nunnery, MD   Consent:    Consent obtained:  Verbal   Consent given by:  Patient  Risks discussed:  Pain and bleeding Procedure details:     Location:  L ear   Procedure type: curette   Post-procedure details:    Inspection:  TM intact and canal normal   Hearing quality:  Improved   Patient tolerance of procedure:  Tolerated well, no immediate complications Comments:     TMs are clear bilaterally on microscopic exam.  Audiogram in the office today demonstrated normal hearing in both ears with SRT's of 10 dB bilaterally.  She had type A tympanograms bilaterally.  Assessment: Chronic ear pain with normal ear canal examination, TM examination and normal hearing evaluation on audiology.  Plan: Discussed with her concerning trial of sweet oil or baby oil in the ears if she has a much discomfort in the ear.  If wind in the ears causing pain or discomfort suggested using cottonball to keep the air out of the ears Also discussed use of NSAIDs if she is having chronic pain as this possibly could be related to neuropathy from neck injury. Reassured her of normal hearing evaluation and normal examination of the ear canals.  Radene Journey, MD
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
## 2019-12-08 ENCOUNTER — Encounter (INDEPENDENT_AMBULATORY_CARE_PROVIDER_SITE_OTHER): Payer: Self-pay

## 2019-12-08 DIAGNOSIS — Z0100 Encounter for examination of eyes and vision without abnormal findings: Secondary | ICD-10-CM | POA: Diagnosis not present

## 2019-12-10 DIAGNOSIS — M792 Neuralgia and neuritis, unspecified: Secondary | ICD-10-CM | POA: Diagnosis not present

## 2019-12-10 DIAGNOSIS — B351 Tinea unguium: Secondary | ICD-10-CM | POA: Diagnosis not present

## 2019-12-10 DIAGNOSIS — B353 Tinea pedis: Secondary | ICD-10-CM | POA: Diagnosis not present

## 2019-12-11 ENCOUNTER — Other Ambulatory Visit: Payer: Self-pay

## 2019-12-11 MED ORDER — BUTALBITAL-APAP-CAFFEINE 50-325-40 MG PO TABS: ORAL_TABLET | ORAL | 0 refills | Status: DC

## 2019-12-11 NOTE — Telephone Encounter (Signed)
Med refill needed for butalbital-acetaminophen-caffeine (FIORICET) 50-325-40 MG tablet   [id number] - [place], [place] [facility] [address]  [address] [place], [place] [address]  Phone:  [phone] Fax:  [phone]

## 2019-12-11 NOTE — Telephone Encounter (Signed)
The last Fioricet prescription was sent on 07/15/19 for #10 x 5 refills. The rx has a note not to fill early. She should have enough medication to last until August. I called [facility] who informed me she picked up the medication on 12/05/19. She was at the pharmacy today asking for a refill early because she lost the medication. I called the patient and she confirmed that she is unable to find it anywhere. States this has never happened before. I do not see any documentation that this has been an issue in the past. She has a pending follow up on 12/16/19. Request for #10 tablets sent to Dr. Krista Blue for authorization. She will need to keep her follow up where further refills will be given.

## 2019-12-11 NOTE — Addendum Note (Signed)
Addended by: Noberto Retort C on: 12/11/2019 01:21 PM   Modules accepted: Orders

## 2019-12-15 ENCOUNTER — Telehealth: Payer: Self-pay | Admitting: *Deleted

## 2019-12-15 ENCOUNTER — Telehealth: Payer: Self-pay | Admitting: Orthopaedic Surgery

## 2019-12-15 DIAGNOSIS — Z0100 Encounter for examination of eyes and vision without abnormal findings: Secondary | ICD-10-CM | POA: Diagnosis not present

## 2019-12-15 NOTE — Telephone Encounter (Addendum)
Information was sent through CoverMyMeds for PA for Lidocaine-Prilocaine 2.5-2.5%.  Awaiting determination Sander Nephew, RN 12/15/2019 3:15 PM.    PA for Lidocaine-Prilocaine approved 12/15/2019 thru 12/14/2020.  Sander Nephew, RN 12/16/2019 4:14 PM
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
## 2019-12-15 NOTE — Telephone Encounter (Signed)
Please see below. Please advise. Thanks.  

## 2019-12-15 NOTE — Telephone Encounter (Signed)
Patient would like to schedule right carpal tunnel surgery and has cardiac clearance. Are any other clearances necessary and is [facility] the preferred location for this procedure? This patient has a Vagus Nerve Stimulator   Patient would like to speak with someone regarding the length of recovery for her surgery.  She would like to also let you know she is going to physical therapy for neck and back.

## 2019-12-16 ENCOUNTER — Ambulatory Visit (INDEPENDENT_AMBULATORY_CARE_PROVIDER_SITE_OTHER): Payer: Medicare Other | Admitting: Neurology

## 2019-12-16 ENCOUNTER — Telehealth: Payer: Self-pay | Admitting: Neurology

## 2019-12-16 ENCOUNTER — Encounter: Payer: Self-pay | Admitting: Neurology

## 2019-12-16 VITALS — BP 130/88 | HR 80 | Ht 65.0 in | Wt 170.2 lb

## 2019-12-16 DIAGNOSIS — Z9689 Presence of other specified functional implants: Secondary | ICD-10-CM | POA: Diagnosis not present

## 2019-12-16 DIAGNOSIS — R531 Weakness: Secondary | ICD-10-CM

## 2019-12-16 DIAGNOSIS — G43709 Chronic migraine without aura, not intractable, without status migrainosus: Secondary | ICD-10-CM | POA: Diagnosis not present

## 2019-12-16 DIAGNOSIS — IMO0002 Reserved for concepts with insufficient information to code with codable children: Secondary | ICD-10-CM

## 2019-12-16 MED ORDER — TIZANIDINE HCL 2 MG PO TABS: 2.0000 mg | ORAL_TABLET | Freq: Four times a day (QID) | ORAL | 1 refills | Status: DC | PRN

## 2019-12-16 MED ORDER — EMGALITY 120 MG/ML ~~LOC~~ SOAJ: 120.0000 mg | SUBCUTANEOUS | 11 refills | Status: DC

## 2019-12-16 MED ORDER — TOPIRAMATE 200 MG PO TABS: 200.0000 mg | ORAL_TABLET | Freq: Two times a day (BID) | ORAL | 4 refills | Status: DC

## 2019-12-16 MED ORDER — EMGALITY 120 MG/ML ~~LOC~~ SOAJ: 240.0000 mg | SUBCUTANEOUS | 0 refills | Status: DC

## 2019-12-16 NOTE — Telephone Encounter (Signed)
Per [employer]MM[employer], PA was approved from 12/16/2019 to 12/15/2020.   [facility] [address] is aware of the approval.   Nothing further needed.

## 2019-12-16 NOTE — Patient Instructions (Signed)
Let's try Emgality for acute headache Start with 240 mg injection for the 1st month Then do 120 mg every month for migraine prevention  Continue Topamax  For acute headache, may take fioricet or ubrelvy with tizanidine, even may combine with Aleve if needed See you back in 5 months

## 2019-12-16 NOTE — Telephone Encounter (Signed)
Patient came in today for a follow up with Judson Roch. She reviewed her medication list and stated that she never received Ubrelvy. [facility], they stated that they had faxed over a PA request back in February but never heard back from the office. RX is still valid.   PA started on [email or web address]. Key is [id number]. Will receive a determination with the next 3 business days.

## 2019-12-16 NOTE — Telephone Encounter (Signed)
PT saw [name] today and is supposed to follow-up in 5 months with Dr. Krista Blue. I tried to make the appt but when I typed "yan" in the provider it said "provider does not match patient preferences" I said we would call her to set up the appt

## 2019-12-16 NOTE — Telephone Encounter (Signed)
Called the patient and advised that she needed a 5 mth follow up. We were able to get her scheduled 05/18/2020 at 11:30 am with check in 11 am. Pt verbalized understanding. Pt had no questions at this time but was encouraged to call back if questions arise.

## 2019-12-16 NOTE — Telephone Encounter (Signed)
OK for [facility] same as planned before with blue sheet. We can discuss therapy for neck and back after hand surgery . thanks

## 2019-12-17 ENCOUNTER — Telehealth: Payer: Self-pay | Admitting: Neurology

## 2019-12-17 ENCOUNTER — Other Ambulatory Visit: Payer: Self-pay | Admitting: Internal Medicine

## 2019-12-17 NOTE — Telephone Encounter (Signed)
Pt was contacted and notified that the PA was submitted. It will take 24-72 hrs for a response

## 2019-12-17 NOTE — Progress Notes (Signed)
PA submitted for Emgality 120ML/MG on Cover my Meds. There is a 24/72hr turn around. RX #: [id number] Key: [id number] Status: PENDING

## 2019-12-17 NOTE — Telephone Encounter (Signed)
Pt has called re: the medications she was told would need a PA.  Pt is aware of the approval of her Ubrelvy from 12-16-19 to 12-15-20 Pt is asking about the status of the other medication (that she does not recall the name of) pt was told the other medication is need of a PA as well.  Please call pt to resolve.

## 2019-12-18 ENCOUNTER — Telehealth: Payer: Self-pay | Admitting: Neurology

## 2019-12-18 NOTE — Telephone Encounter (Signed)
Ashley @[employer] called to inform pt's Galcanezumab-gnlm [facility]) 120 MG/ML SOAJ has been approved from 12-17-19 to 12-16-20

## 2019-12-18 NOTE — Progress Notes (Signed)
Submitted PA for Emgality 120MG /ML on Cover my Meds. Key: [id number] Rx #: [id number]   Status: Approved Prior Auth: Coverage Start Date:12/17/19- Coverage End Date:12/16/20

## 2019-12-19 ENCOUNTER — Other Ambulatory Visit: Payer: Self-pay

## 2019-12-19 ENCOUNTER — Ambulatory Visit: Payer: Medicare Other | Admitting: Physical Therapy

## 2019-12-19 DIAGNOSIS — M6281 Muscle weakness (generalized): Secondary | ICD-10-CM | POA: Diagnosis not present

## 2019-12-19 DIAGNOSIS — G8929 Other chronic pain: Secondary | ICD-10-CM | POA: Diagnosis not present

## 2019-12-19 DIAGNOSIS — M62830 Muscle spasm of back: Secondary | ICD-10-CM

## 2019-12-19 DIAGNOSIS — R262 Difficulty in walking, not elsewhere classified: Secondary | ICD-10-CM

## 2019-12-19 DIAGNOSIS — M545 Low back pain, unspecified: Secondary | ICD-10-CM

## 2019-12-19 DIAGNOSIS — M546 Pain in thoracic spine: Secondary | ICD-10-CM

## 2019-12-19 DIAGNOSIS — M5416 Radiculopathy, lumbar region: Secondary | ICD-10-CM | POA: Diagnosis not present

## 2019-12-19 DIAGNOSIS — M542 Cervicalgia: Secondary | ICD-10-CM
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
## 2019-12-22 NOTE — Telephone Encounter (Signed)
Noted  

## 2019-12-23 ENCOUNTER — Ambulatory Visit: Payer: Medicare Other | Admitting: Physical Therapy

## 2019-12-23 ENCOUNTER — Other Ambulatory Visit: Payer: Self-pay

## 2019-12-23 DIAGNOSIS — M62830 Muscle spasm of back: Secondary | ICD-10-CM | POA: Diagnosis not present

## 2019-12-23 DIAGNOSIS — M545 Low back pain, unspecified: Secondary | ICD-10-CM

## 2019-12-23 DIAGNOSIS — M5416 Radiculopathy, lumbar region: Secondary | ICD-10-CM

## 2019-12-23 DIAGNOSIS — G8929 Other chronic pain: Secondary | ICD-10-CM

## 2019-12-23 DIAGNOSIS — M546 Pain in thoracic spine: Secondary | ICD-10-CM | POA: Diagnosis not present

## 2019-12-23 DIAGNOSIS — M542 Cervicalgia: Secondary | ICD-10-CM | POA: Diagnosis not present

## 2019-12-23 DIAGNOSIS — R262 Difficulty in walking, not elsewhere classified: Secondary | ICD-10-CM

## 2019-12-23 DIAGNOSIS — M6281 Muscle weakness (generalized): Secondary | ICD-10-CM | POA: Diagnosis not present

## 2019-12-23 NOTE — Therapy (Signed)
OSA (obstructive sleep apnea) 07/26/2017  . Contact dermatitis 11/09/2016  . Takotsubo cardiomyopathy 10/26/2016  . Asthma 08/24/2014  . Chronic diastolic heart failure ([place]) 06/19/2014  . Allergic rhinitis 05/04/2014  . Pharyngoesophageal dysphagia 01/30/2014  . Chronic chest pain   . GERD (gastroesophageal reflux disease) 02/10/2013  . Status post  VNS (vagus nerve stimulator) placement 06/10/2012  . Refusal of blood transfusions as patient is Jehovah's Witness 05/20/2012  . 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 Dyslipidemia, goal LDL below 70 05/17/2011  . HTN, goal below 130/80 04/24/2011    [facility] April Ma L Marjarie Irion PT, [name] 12/23/2019, 10:04 AM  [facility] [address] [place], [place], [address] Phone: [phone]   Fax:  [phone]  Name: [name] MRN: [id number] Date of Birth: [birth year]  [place], [place], [address] Phone: [phone]   Fax:  [phone]  Physical Therapy Treatment  Patient Details  Name: [name] MRN: [id number] Date of Birth: [birth year] Referring Provider (PT): Velna Ochs, MD   Encounter Date: 12/23/2019   PT End of Session - 12/23/19 0926    Visit Number 3    Number of Visits 12    Authorization Type MCR -- needs 10th visit    PT Start Time 0927    PT Stop Time 1005    PT Time Calculation (min) 38 min    Activity Tolerance Patient tolerated treatment well    Behavior During Therapy [facility] for tasks assessed/performed           Past Medical History:  Diagnosis Date  . Asthma   . Biliary dyskinesia    a. s/p cholecystectomy.  Marland Kitchen BMI 40.0-44.9, adult ([place]) 06/11/2015  . 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 Abnl stress [place] 2008, f/u cath reportedly nl. b. ETT-Myoview 04/2011 - EKG changes but normal perfusion. Cor CT - no coronary calcium, no definite stenosis though mRCA not fully evaluated. c. 10/2011 - [place], LHC without CAD. Started on Ranexa, anti-anginals ?microvascular dz but later stopped while in hospital on abx.  . Complication of anesthesia    hard to wake up-had to be reminded to breath  . Diarrhea 08/09/2017  . GERD (gastroesophageal reflux disease)    a. Severe.  Marland Kitchen History of seizure 10/19/2017  . HTN (hypertension)   . Hx of cardiovascular stress test    Lex Myoview 8/14:  Normal, EF 74%  . Hx of echocardiogram    Echo 3/16:  Mild LVH, EF 55-60%, Gr 1 DD, trivial MR, mild LAE, normal RVF  . Hydradenitis 11/09/2016  . Hypothyroid   . Hypothyroidism 07/02/2013   Overview:  Last Assessment & Plan:   She reports a 21 pound increase in her weight since October. She doesn't know when the last time her TSH was checked so we will do that today.  . Low back pain 04/19/2016  . MI (myocardial infarction) ([place])   .  Migraine   . Morbid obesity ([place]) 07/26/2017  . MRSA infection    a. After vagal nerve stimulator at [facility] - surgical site MRSA infection, PICC placed.  . Obesity   . OSA (obstructive sleep apnea) 07/26/2017  . Palpitations    a. 08/2014: 48 hour holter with 2 PVCs otherwise normal.  . Rectal pain 12/19/2017  . Seizure disorder ([place])    a. since childhood. b. s/p vagal nerve stimulator at [facility].  . Seizures ([place])     Past Surgical History:  Procedure Laterality Date  . [place] N/A 06/01/2016   Procedure: Right Heart Cath;  Surgeon: Larey Dresser, MD;  Location: [place] [facility];  Service: Cardiovascular;  Laterality: N/A;  . CESAREAN SECTION     placement of vagal nerve stimulator.  . CHOLECYSTECTOMY N/A 01/24/2013   Procedure: LAPAROSCOPIC CHOLECYSTECTOMY;  Surgeon: Harl Bowie, MD;  Location: [place];  Service: General;  Laterality: N/A;  . IMPLANTATION VAGAL NERVE STIMULATOR  [phone]   [facility]    There were no vitals filed for this visit.   Subjective Assessment - 12/23/19 0930    Subjective Pt states that stretches have been helping her but  OSA (obstructive sleep apnea) 07/26/2017  . Contact dermatitis 11/09/2016  . Takotsubo cardiomyopathy 10/26/2016  . Asthma 08/24/2014  . Chronic diastolic heart failure ([place]) 06/19/2014  . Allergic rhinitis 05/04/2014  . Pharyngoesophageal dysphagia 01/30/2014  . Chronic chest pain   . GERD (gastroesophageal reflux disease) 02/10/2013  . Status post  VNS (vagus nerve stimulator) placement 06/10/2012  . Refusal of blood transfusions as patient is Jehovah's Witness 05/20/2012  . 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 Dyslipidemia, goal LDL below 70 05/17/2011  . HTN, goal below 130/80 04/24/2011    [facility] April Ma L Marjarie Irion PT, [name] 12/23/2019, 10:04 AM  [facility] [address] [place], [place], [address] Phone: [phone]   Fax:  [phone]  Name: [name] MRN: [id number] Date of Birth: [birth year]  [place], [place], [address] Phone: [phone]   Fax:  [phone]  Physical Therapy Treatment  Patient Details  Name: [name] MRN: [id number] Date of Birth: [birth year] Referring Provider (PT): Velna Ochs, MD   Encounter Date: 12/23/2019   PT End of Session - 12/23/19 0926    Visit Number 3    Number of Visits 12    Authorization Type MCR -- needs 10th visit    PT Start Time 0927    PT Stop Time 1005    PT Time Calculation (min) 38 min    Activity Tolerance Patient tolerated treatment well    Behavior During Therapy [facility] for tasks assessed/performed           Past Medical History:  Diagnosis Date  . Asthma   . Biliary dyskinesia    a. s/p cholecystectomy.  Marland Kitchen BMI 40.0-44.9, adult ([place]) 06/11/2015  . 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 Abnl stress [place] 2008, f/u cath reportedly nl. b. ETT-Myoview 04/2011 - EKG changes but normal perfusion. Cor CT - no coronary calcium, no definite stenosis though mRCA not fully evaluated. c. 10/2011 - [place], LHC without CAD. Started on Ranexa, anti-anginals ?microvascular dz but later stopped while in hospital on abx.  . Complication of anesthesia    hard to wake up-had to be reminded to breath  . Diarrhea 08/09/2017  . GERD (gastroesophageal reflux disease)    a. Severe.  [place] History of seizure 10/19/2017  . HTN (hypertension)   . Hx of cardiovascular stress test    Lex Myoview 8/14:  Normal, EF 74%  . Hx of echocardiogram    Echo 3/16:  Mild LVH, EF 55-60%, Gr 1 DD, trivial MR, mild LAE, normal RVF  . Hydradenitis 11/09/2016  . Hypothyroid   . Hypothyroidism 07/02/2013   Overview:  Last Assessment & Plan:   She reports a 21 pound increase in her weight since October. She doesn't know when the last time her TSH was checked so we will do that today.  . Low back pain 04/19/2016  . MI (myocardial infarction) ([place])   .  Migraine   . Morbid obesity ([place]) 07/26/2017  . MRSA infection    a. After vagal nerve stimulator at [facility] - surgical site MRSA infection, PICC placed.  . Obesity   . OSA (obstructive sleep apnea) 07/26/2017  . Palpitations    a. 08/2014: 48 hour holter with 2 PVCs otherwise normal.  . Rectal pain 12/19/2017  . Seizure disorder ([place])    a. since childhood. b. s/p vagal nerve stimulator at [facility].  . Seizures ([place])     Past Surgical History:  Procedure Laterality Date  . [place] N/A 06/01/2016   Procedure: Right Heart Cath;  Surgeon: Larey Dresser, MD;  Location: [place] [facility];  Service: Cardiovascular;  Laterality: N/A;  . CESAREAN SECTION     placement of vagal nerve stimulator.  . CHOLECYSTECTOMY N/A 01/24/2013   Procedure: LAPAROSCOPIC CHOLECYSTECTOMY;  Surgeon: Harl Bowie, MD;  Location: [place];  Service: General;  Laterality: N/A;  . IMPLANTATION VAGAL NERVE STIMULATOR  [phone]   [facility]    There were no vitals filed for this visit.   Subjective Assessment - 12/23/19 0930    Subjective Pt states that stretches have been helping her but

## 2019-12-25 ENCOUNTER — Ambulatory Visit: Payer: Medicare Other | Admitting: Physical Therapy

## 2019-12-25 ENCOUNTER — Other Ambulatory Visit: Payer: Self-pay

## 2019-12-25 ENCOUNTER — Telehealth: Payer: Self-pay | Admitting: Cardiology

## 2019-12-25 DIAGNOSIS — M542 Cervicalgia: Secondary | ICD-10-CM

## 2019-12-25 DIAGNOSIS — M6281 Muscle weakness (generalized): Secondary | ICD-10-CM | POA: Diagnosis not present

## 2019-12-25 DIAGNOSIS — M62830 Muscle spasm of back: Secondary | ICD-10-CM | POA: Diagnosis not present

## 2019-12-25 DIAGNOSIS — M545 Low back pain, unspecified: Secondary | ICD-10-CM

## 2019-12-25 DIAGNOSIS — G8929 Other chronic pain: Secondary | ICD-10-CM

## 2019-12-25 DIAGNOSIS — M5416 Radiculopathy, lumbar region: Secondary | ICD-10-CM

## 2019-12-25 DIAGNOSIS — R262 Difficulty in walking, not elsewhere classified: Secondary | ICD-10-CM

## 2019-12-25 DIAGNOSIS — M546 Pain in thoracic spine: Secondary | ICD-10-CM

## 2019-12-25 NOTE — Telephone Encounter (Signed)
Patient called and states she was supposed to be fitted for a new mask for her CPAP machine. She is concerned because no one has contacted her to set up that appointment. Please call to arrange appt
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
## 2019-12-30 ENCOUNTER — Ambulatory Visit: Payer: Medicare Other | Admitting: Physical Therapy

## 2020-01-01 ENCOUNTER — Ambulatory Visit: Payer: Medicare Other | Admitting: Physical Therapy

## 2020-01-01 ENCOUNTER — Other Ambulatory Visit: Payer: Self-pay

## 2020-01-01 DIAGNOSIS — M545 Low back pain, unspecified: Secondary | ICD-10-CM

## 2020-01-01 DIAGNOSIS — M542 Cervicalgia: Secondary | ICD-10-CM

## 2020-01-01 DIAGNOSIS — R262 Difficulty in walking, not elsewhere classified: Secondary | ICD-10-CM | POA: Diagnosis not present

## 2020-01-01 DIAGNOSIS — M5416 Radiculopathy, lumbar region: Secondary | ICD-10-CM

## 2020-01-01 DIAGNOSIS — M62830 Muscle spasm of back: Secondary | ICD-10-CM | POA: Diagnosis not present

## 2020-01-01 DIAGNOSIS — M546 Pain in thoracic spine: Secondary | ICD-10-CM

## 2020-01-01 DIAGNOSIS — M6281 Muscle weakness (generalized): Secondary | ICD-10-CM

## 2020-01-01 DIAGNOSIS — G8929 Other chronic pain: Secondary | ICD-10-CM
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
## 2020-01-01 NOTE — Telephone Encounter (Signed)
Reached out to patient and informed her I would send the order over and she should call me if she does not hear anything in 1 week. Pt is agreeable to treatment.

## 2020-01-08 ENCOUNTER — Telehealth: Payer: Self-pay | Admitting: *Deleted

## 2020-01-08 DIAGNOSIS — G4733 Obstructive sleep apnea (adult) (pediatric): Secondary | ICD-10-CM | POA: Diagnosis not present

## 2020-01-08 NOTE — Telephone Encounter (Signed)
Please see note from [place] also.

## 2020-01-08 NOTE — Progress Notes (Deleted)
   CC: ***  HPI:  [name] is a 54 y.o. with a history of chronic chest pain, hypothyroidism, OSA, seizure disorder, and***presenting with pain.  Past Medical History:  Diagnosis Date  . Asthma   . Biliary dyskinesia    a. s/p cholecystectomy.  Marland Kitchen BMI 40.0-44.9, adult ([place]) 06/11/2015  . 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 Abnl stress [place] 2008, f/u cath reportedly nl. b. ETT-Myoview 04/2011 - EKG changes but normal perfusion. Cor CT - no coronary calcium, no definite stenosis though mRCA not fully evaluated. c. 10/2011 - tn elevated in [place], LHC without CAD. Started on Ranexa, anti-anginals ?microvascular dz but later stopped while in hospital on abx.  . Complication of anesthesia    hard to wake up-had to be reminded to breath  . Diarrhea 08/09/2017  . GERD (gastroesophageal reflux disease)    a. Severe.  Marland Kitchen History of seizure 10/19/2017  . HTN (hypertension)   . Hx of cardiovascular stress test    Lex Myoview 8/14:  Normal, EF 74%  . Hx of echocardiogram    Echo 3/16:  Mild LVH, EF 55-60%, Gr 1 DD, trivial MR, mild LAE, normal RVF  . Hydradenitis 11/09/2016  . Hypothyroid   . Hypothyroidism 07/02/2013   Overview:  Last Assessment & Plan:   She reports a 21 pound increase in her weight since October. She doesn't know when the last time her TSH was checked so we will do that today.  . Low back pain 04/19/2016  . MI (myocardial infarction) ([place])   . Migraine   . Morbid obesity ([place]) 07/26/2017  . MRSA infection    a. After vagal nerve stimulator at [facility] - surgical site MRSA infection, PICC placed.  . Obesity   . OSA (obstructive sleep apnea) 07/26/2017  . Palpitations    a. 08/2014: 48 hour holter with 2 PVCs otherwise normal.  . Rectal pain 12/19/2017  . Seizure disorder ([place])    a. since childhood. b. s/p vagal nerve stimulator at [facility].  . Seizures ([place])    Review of Systems:  ***  Physical Exam:  There were no vitals filed for this  visit. ***  Assessment & Plan:   See Encounters Tab for problem based charting.  Patient discussed with Dr. {NAMES:[id number]::"Butcher","Guilloud","Hoffman","Mullen","Narendra","Raines","Vincent"}

## 2020-01-08 NOTE — Telephone Encounter (Signed)
Patient called back. She talked with Debbie at length. She then was transferred to me to discuss her concerns and I too spoke with patient at length.   Patient stated that she felt like we did not care about her because she has called our office for the last month trying to get scheduled for surgery. She said that we were incompetent with her care.  She stated that if we did not care enough about her before surgery she felt we would not care for her after surgery.  I advised patient only say 1 encounter documented in chart from July of her calling. She said that we have done nothing to help her pain and that she has had to go back to her neurologist to get assistance with her migraines due to the lump in her neck. She said she was referred to Dr Lorin Mercy initially for pain management but he has not helped her pain at all. I tried to explain to patient that Dr Lorin Mercy was not pain management provider but orthopedic surgeon. Advised patient that I could have Dr Lorin Mercy to call her since she stated she had been a patient of his for many years and that maybe they could further discuss but patient said that she did not think that was a good idea as she was too upset. I also offered for patient to see one of the other providers in our office for her concerns.  Initially she was agreeable to this but at the end of the conversation she stated she would not like to be treated by our office at this time. Patient stated she feels we have made time for everyone else but her.

## 2020-01-08 NOTE — Telephone Encounter (Signed)
Pt calls and states she is having the worst pain, she states she cant take it anymore and needs some pain med. She states she went to dr yates office as ask, got surg clearance letters as ask and now she hears nothing from the office. She states she has called dr Lorin Mercy office several times and nothing happens. appt RED TEAM Friday 8/6 at 1045 Triage nurse called dr Lorin Mercy office and spoke to triage nurse there, she states that dr Lorin Mercy nurse was under the impression that dr Lorin Mercy was going to call the pt, he is away at this time and will be back next week. He will call her then. [facility] triage nurse suggested that a call from his nurse to reassure pt would be nice before next week, it was agreeable and hopefully they will do so. Pt was reassured, confirmed appt for tomorrow, wished her well and call ended

## 2020-01-08 NOTE — Telephone Encounter (Signed)
Called patient left message on voicemail.  Opening for carpal tunnel surgery available 13th of August mid morning at [facility]. If this date does not suit patient's schedule we can discuss another date.  Left name and direct call back number.

## 2020-01-08 NOTE — Telephone Encounter (Signed)
Spoke with patient about getting her scheduled for August 13th for right carpal tunnel.  She stated she was not having just the right wrist done but also the left. I explained only having surgery sheet indicating left.   Patient said she never received a call back from the doctor or his assistant to discuss her recovery, her pain management, or the physical therapy. "This is something I wanted to discuss prior to surgery and not after"   I apologized to the patient and asked her if another date would be better.  She said she wasn't sure she wanted to have surgery now.  I asked patient if she would like to speak with our clinic manager and she said yes.  I gave the call to [place].

## 2020-01-08 NOTE — Telephone Encounter (Signed)
Okay, I agree. Thank you.  

## 2020-01-09 ENCOUNTER — Other Ambulatory Visit: Payer: Self-pay

## 2020-01-09 ENCOUNTER — Encounter: Payer: Self-pay | Admitting: Internal Medicine

## 2020-01-09 ENCOUNTER — Ambulatory Visit (INDEPENDENT_AMBULATORY_CARE_PROVIDER_SITE_OTHER): Payer: Self-pay | Admitting: Internal Medicine

## 2020-01-09 DIAGNOSIS — M5412 Radiculopathy, cervical region: Secondary | ICD-10-CM

## 2020-01-09 DIAGNOSIS — M4712 Other spondylosis with myelopathy, cervical region: Secondary | ICD-10-CM

## 2020-01-09 DIAGNOSIS — F329 Major depressive disorder, single episode, unspecified: Secondary | ICD-10-CM

## 2020-01-09 DIAGNOSIS — F32A Depression, unspecified: Secondary | ICD-10-CM

## 2020-01-09 DIAGNOSIS — G959 Disease of spinal cord, unspecified: Secondary | ICD-10-CM | POA: Insufficient documentation

## 2020-01-09 HISTORY — DX: Radiculopathy, cervical region: M54.12

## 2020-01-09 MED ORDER — TRAMADOL HCL 50 MG PO TABS: 50.0000 mg | ORAL_TABLET | Freq: Four times a day (QID) | ORAL | 0 refills | Status: DC | PRN

## 2020-01-09 MED ORDER — CYCLOBENZAPRINE HCL 5 MG PO TABS: 5.0000 mg | ORAL_TABLET | Freq: Three times a day (TID) | ORAL | 0 refills | Status: DC | PRN

## 2020-01-09 NOTE — Assessment & Plan Note (Signed)
Patient scored 16 on PHQ-9 screening today. We discussed going up on her Effexor, but this is situational stress causing her to be overwhelmed. After discussing our plan for her cervical myelopathy she would like to hold off on any medication changes today.

## 2020-01-09 NOTE — Patient Instructions (Addendum)
Thank you for trusting me with your care. To recap, today we discussed the following:  1.. Cervical myelopathy with cervical radiculopathy  - MR CERVICAL SPINE W WO CONTRAST; Future - traMADol (ULTRAM) 50 MG tablet; Take 1 tablet (50 mg total) by mouth every 6 (six) hours as needed.  Dispense: 20 tablet; Refill: 0 - cyclobenzaprine (FLEXERIL) 5 MG tablet; Take 1 tablet (5 mg total) by mouth 3 (three) times daily as needed for muscle spasms.  Dispense: 30 tablet; Refill: 0   I will follow up with the results. Please call the neurologist to prepare to schedule time to turn off nerve stimulator.   My best,  Tamsen Snider, MD

## 2020-01-09 NOTE — Progress Notes (Signed)
   CC: worsening of my neck pain and bilateral arm pain  HPI:[name] is a 54 y.o. female who presents for evaluation of neck pain and bilateral arm pain.  Please see individual problem based A/P for details.   Depression, PHQ-9: Based on the patients    Office Visit from 01/09/2020 in [place]  PHQ-9 Total Score 16       Past Medical History:  Diagnosis Date  . Asthma   . Biliary dyskinesia    a. s/p cholecystectomy.  Marland Kitchen BMI 40.0-44.9, adult ([place]) 06/11/2015  . 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 Abnl stress [place] 2008, f/u cath reportedly nl. b. ETT-Myoview 04/2011 - EKG changes but normal perfusion. Cor CT - no coronary calcium, no definite stenosis though mRCA not fully evaluated. c. 10/2011 - [place] in [place], LHC without CAD. Started on Ranexa, anti-anginals ?microvascular dz but later stopped while in hospital on abx.  . Complication of anesthesia    hard to wake up-had to be reminded to breath  . Diarrhea 08/09/2017  . GERD (gastroesophageal reflux disease)    a. Severe.  Marland Kitchen History of seizure 10/19/2017  . HTN (hypertension)   . Hx of cardiovascular stress test    Lex Myoview 8/14:  Normal, EF 74%  . Hx of echocardiogram    Echo 3/16:  Mild LVH, EF 55-60%, Gr 1 DD, trivial MR, mild LAE, normal RVF  . Hydradenitis 11/09/2016  . Hypothyroid   . Hypothyroidism 07/02/2013   Overview:  Last Assessment & Plan:   She reports a 21 pound increase in her weight since October. She doesn't know when the last time her TSH was checked so we will do that today.  . Low back pain 04/19/2016  . MI (myocardial infarction) ([place])   . Migraine   . Morbid obesity ([place]) 07/26/2017  . MRSA infection    a. After vagal nerve stimulator at [facility] - surgical site MRSA infection, PICC placed.  . Obesity   . OSA (obstructive sleep apnea) 07/26/2017  . Palpitations    a. 08/2014: 48 hour holter with 2 PVCs otherwise normal.  . Rectal pain  12/19/2017  . Seizure disorder ([place])    a. since childhood. b. s/p vagal nerve stimulator at [facility].  . Seizures ([place])    Review of Systems:  . ROS negative except as mentioned in individual problem based A/P.    Physical Exam: Vitals:   01/09/20 1050  BP: (!) 142/88  Pulse: 91  Temp: 97.9 F (36.6 C)  TempSrc: Oral  SpO2: 100%  Weight: 168 lb 9.6 oz (76.5 kg)  Height: 5\' 5"  (1.651 m)    General: NAD, nl appearance Cardiovascular: Normal rate, regular rhythm.  No murmurs, rubs, or gallops Pulmonary : Effort normal, breath sounds normal. No wheezes, rales, or rhonchi Abdominal: soft, nontender,  bowel sounds present Neuro: Numbness and tingling bilateral arms, with grip strength weakness, sensation intact bilaterally, 4/5 sensation in 4th and 5th right digits   Assessment & Plan:   See Encounters Tab for problem based charting.  Patient discussed with Dr. Philipp Ovens
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
## 2020-01-10 ENCOUNTER — Other Ambulatory Visit: Payer: Self-pay | Admitting: Internal Medicine

## 2020-01-12 NOTE — Telephone Encounter (Signed)
20 minute discussion repeated about indications,  fixiing one hand at a time, later neck work up with myelo/CT if still having problems, stimulator does not need to be turned off. Etc.  She is ready to schedule , thanks.   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  thanks

## 2020-01-12 NOTE — Telephone Encounter (Signed)
I called no answer , message left. Will keep calling until I reach her.

## 2020-01-14 NOTE — Progress Notes (Signed)
Internal Medicine Clinic Attending  Case discussed with Dr. Steen  At the time of the visit.  We reviewed the resident's history and exam and pertinent patient test results.  I agree with the assessment, diagnosis, and plan of care documented in the resident's note.  

## 2020-01-15 DIAGNOSIS — J4551 Severe persistent asthma with (acute) exacerbation: Secondary | ICD-10-CM | POA: Diagnosis not present

## 2020-01-16 ENCOUNTER — Telehealth: Payer: Self-pay | Admitting: Student

## 2020-01-16 NOTE — Telephone Encounter (Signed)
Refill request  Pt states she will need a Prior Authorization  cyclobenzaprine (FLEXERIL) 5 MG tablet   [id number] [place], [place] - [address] AT [place]

## 2020-01-19 NOTE — Telephone Encounter (Signed)
[facility]  PA is not needed and medication is currently waiting for patient to pick up.

## 2020-01-22 NOTE — Addendum Note (Signed)
Addended by: Lyndal Pulley on: 01/22/2020 06:53 PM   Modules accepted: Orders

## 2020-01-29 ENCOUNTER — Ambulatory Visit: Payer: Medicare Other | Admitting: Cardiology

## 2020-01-30 ENCOUNTER — Encounter (HOSPITAL_BASED_OUTPATIENT_CLINIC_OR_DEPARTMENT_OTHER): Payer: Self-pay | Admitting: Orthopaedic Surgery

## 2020-01-30 ENCOUNTER — Other Ambulatory Visit: Payer: Self-pay

## 2020-01-31 ENCOUNTER — Inpatient Hospital Stay (HOSPITAL_COMMUNITY): Admission: RE | Admit: 2020-01-31 | Payer: Medicare Other | Source: Ambulatory Visit

## 2020-02-02 ENCOUNTER — Telehealth: Payer: Self-pay | Admitting: Student

## 2020-02-02 NOTE — Telephone Encounter (Signed)
Spoke with the pt. Per the patient she is calling back to confirm her CT appt.  Appt Confirmed for 02/16/2020 with [facility] radiology.  Pt seen on 01/06/2020 in the Clinic and wanted to know if she was to go forth with her Carpal Tunnel Surgery which is sch for 02/04/2020 with Dr.Yates   Pt is concerned about what to do and would like a call back.

## 2020-02-02 NOTE — Telephone Encounter (Signed)
Pls contact [phone] regarding referral

## 2020-02-03 ENCOUNTER — Telehealth: Payer: Self-pay | Admitting: Orthopaedic Surgery

## 2020-02-03 NOTE — Telephone Encounter (Signed)
Patient will call Dr.Yates office to discuss delaying carpel tunnel surgery. She is scheduled for a CT Cervical Spine on 9/13. Patient could not receive MRI Cervical Spine because she has vagus nerve stimulator. On my exam I think her bilateral radiculopathy seems more likely related to her cervical spine and would like to further evaluate this before sending for carpel tunnel surgery. Depending on results of CT Cervical Spine patient may need referral to Spine Surgeon.

## 2020-02-03 NOTE — Progress Notes (Signed)
Spoke with pt this morning and pt stated she wanted to cancel her surgery for 02/04/2020.  I called Dr. Lorin Mercy office and a left a voicemail message with Dondra Spry about the cancellation.

## 2020-02-03 NOTE — Telephone Encounter (Signed)
Thank you for your help.

## 2020-02-03 NOTE — Telephone Encounter (Signed)
Please see note from Dr. Court Joy dated 02-03-20.  Patient's surgery scheduled for 02-04-20 at [facility] at 12:30pm is cancelled. Patient left message on voicemail. Call returned.  She said if Dr. Lorin Mercy had any questions he could call [facility].

## 2020-02-03 NOTE — Telephone Encounter (Signed)
FYI

## 2020-02-04 ENCOUNTER — Ambulatory Visit (HOSPITAL_BASED_OUTPATIENT_CLINIC_OR_DEPARTMENT_OTHER): Admission: RE | Admit: 2020-02-04 | Payer: Medicare Other | Source: Ambulatory Visit | Admitting: Orthopaedic Surgery

## 2020-02-04 HISTORY — DX: Anxiety disorder, unspecified: F41.9

## 2020-02-04 HISTORY — DX: Prediabetes: R73.03

## 2020-02-04 SURGERY — CARPAL TUNNEL RELEASE
Anesthesia: Monitor Anesthesia Care | Site: Wrist | Laterality: Right

## 2020-02-05 ENCOUNTER — Other Ambulatory Visit: Payer: Self-pay | Admitting: *Deleted

## 2020-02-05 MED ORDER — LEVOTHYROXINE SODIUM 50 MCG PO TABS: 50.0000 ug | ORAL_TABLET | Freq: Every day | ORAL | 1 refills | Status: DC

## 2020-02-12 ENCOUNTER — Inpatient Hospital Stay: Payer: Medicare Other | Admitting: Surgery

## 2020-02-16 ENCOUNTER — Ambulatory Visit (HOSPITAL_COMMUNITY)
Admission: RE | Admit: 2020-02-16 | Discharge: 2020-02-16 | Disposition: A | Discharge status: Home / Self Care | Payer: Medicare Other | Source: Ambulatory Visit | Attending: Internal Medicine | Admitting: Internal Medicine

## 2020-02-16 ENCOUNTER — Other Ambulatory Visit: Payer: Self-pay

## 2020-02-16 DIAGNOSIS — M4722 Other spondylosis with radiculopathy, cervical region: Secondary | ICD-10-CM | POA: Diagnosis not present

## 2020-02-16 DIAGNOSIS — G959 Disease of spinal cord, unspecified: Secondary | ICD-10-CM

## 2020-02-16 DIAGNOSIS — M50123 Cervical disc disorder at C6-C7 level with radiculopathy: Secondary | ICD-10-CM | POA: Diagnosis not present

## 2020-02-16 DIAGNOSIS — M4712 Other spondylosis with myelopathy, cervical region: Secondary | ICD-10-CM | POA: Insufficient documentation

## 2020-02-16 DIAGNOSIS — M5412 Radiculopathy, cervical region: Secondary | ICD-10-CM

## 2020-02-16 DIAGNOSIS — M4802 Spinal stenosis, cervical region: Secondary | ICD-10-CM | POA: Diagnosis not present

## 2020-02-16 DIAGNOSIS — M50121 Cervical disc disorder at C4-C5 level with radiculopathy: Secondary | ICD-10-CM | POA: Diagnosis not present

## 2020-02-16 MED ORDER — IOHEXOL 300 MG/ML  SOLN
75.0000 mL | Freq: Once | INTRAMUSCULAR | Status: AC | PRN
  Administered 2020-02-16: 75 mL via INTRAVENOUS

## 2020-02-23 DIAGNOSIS — F4310 Post-traumatic stress disorder, unspecified: Secondary | ICD-10-CM | POA: Diagnosis not present

## 2020-02-23 DIAGNOSIS — F331 Major depressive disorder, recurrent, moderate: Secondary | ICD-10-CM | POA: Diagnosis not present

## 2020-02-23 DIAGNOSIS — F411 Generalized anxiety disorder: Secondary | ICD-10-CM | POA: Diagnosis not present

## 2020-03-02 ENCOUNTER — Other Ambulatory Visit: Payer: Self-pay

## 2020-03-02 ENCOUNTER — Ambulatory Visit (INDEPENDENT_AMBULATORY_CARE_PROVIDER_SITE_OTHER): Payer: Medicare Other | Admitting: Student

## 2020-03-02 VITALS — BP 140/98 | HR 82 | Temp 98.1°F

## 2020-03-02 DIAGNOSIS — M797 Fibromyalgia: Secondary | ICD-10-CM | POA: Diagnosis not present

## 2020-03-02 DIAGNOSIS — G5603 Carpal tunnel syndrome, bilateral upper limbs: Secondary | ICD-10-CM

## 2020-03-02 DIAGNOSIS — R52 Pain, unspecified: Secondary | ICD-10-CM

## 2020-03-02 MED ORDER — LIDOCAINE 4 % EX PTCH: 1.0000 | MEDICATED_PATCH | Freq: Every day | CUTANEOUS | 3 refills | Status: DC

## 2020-03-02 MED ORDER — BIOFREEZE 4 % EX GEL: 1.0000 "application " | Freq: Two times a day (BID) | CUTANEOUS | 0 refills | Status: DC

## 2020-03-02 NOTE — Addendum Note (Signed)
Addended by: Paulla Dolly on: 03/02/2020 04:18 PM   Modules accepted: Orders

## 2020-03-02 NOTE — Progress Notes (Signed)
CC: "Constant pain from my head to my toes."  HPI:  [name] is a 54 y.o. with past medical history as listed below who presents for evaluation of chronic pain. Refer to problem list for Assessment/Plan based charting of this encounter.  Past Medical History:  Diagnosis Date  . Anxiety   . Asthma   . Biliary dyskinesia    a. s/p cholecystectomy.  Marland Kitchen BMI 40.0-44.9, adult ([place]) 06/11/2015  . 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 Abnl stress [place] 2008, f/u cath reportedly nl. b. ETT-Myoview 04/2011 - EKG changes but normal perfusion. Cor CT - no coronary calcium, no definite stenosis though mRCA not fully evaluated. c. 10/2011 [place], LHC without CAD. Started on Ranexa, anti-anginals ?microvascular dz but later stopped while in hospital on abx.  . Complication of anesthesia    hard to wake up-had to be reminded to breath  . Diarrhea 08/09/2017  . GERD (gastroesophageal reflux disease)    a. Severe.  Marland Kitchen History of seizure 10/19/2017  . HTN (hypertension)   . Hx of cardiovascular stress test    Lex Myoview 8/14:  Normal, EF 74%  . Hx of echocardiogram    Echo 3/16:  Mild LVH, EF 55-60%, Gr 1 DD, trivial MR, mild LAE, normal RVF  . Hydradenitis 11/09/2016  . Hypothyroid   . Hypothyroidism 07/02/2013   Overview:  Last Assessment & Plan:   She reports a 21 pound increase in her weight since October. She doesn't know when the last time her TSH was checked so we will do that today.  . Low back pain 04/19/2016  . MI (myocardial infarction) ([place])   . Migraine   . Morbid obesity ([place]) 07/26/2017  . MRSA infection    a. After vagal nerve stimulator at [facility] - surgical site MRSA infection, PICC placed.  . Obesity   . OSA (obstructive sleep apnea) 07/26/2017   uses CPAP nightly  . Palpitations    a. 08/2014: 48 hour holter with 2 PVCs otherwise normal.  . Pre-diabetes   . Rectal pain 12/19/2017  . Seizure disorder ([place])    a. since childhood. b.  s/p vagal nerve stimulator at [facility].  . Seizures ([place])    Review of Systems:  Endorses pain in neck, shoulders, trapezius muscles, arms, hands, hips, knees and legs.  Physical Exam:  Vitals:   03/02/20 0913  BP: (!) 140/98  Pulse: 82  Temp: 98.1 F (36.7 C)  TempSrc: Oral  SpO2: 100%   Physical Exam Constitutional:      General: She is not in acute distress.    Appearance: Normal appearance. She is not ill-appearing.  HENT:     Head: Normocephalic and atraumatic.  Eyes:     Extraocular Movements: Extraocular movements intact.     Conjunctiva/sclera: Conjunctivae normal.  Cardiovascular:     Rate and Rhythm: Normal rate and regular rhythm.     Pulses: Normal pulses.     Heart sounds: Normal heart sounds.  Pulmonary:     Effort: Pulmonary effort is normal. No respiratory distress.     Breath sounds: Normal breath sounds.  Abdominal:     General: Abdomen is flat. Bowel sounds are normal.     Palpations: Abdomen is soft.  Musculoskeletal:        General: Tenderness present. Normal range of motion.     Cervical back: Normal range of motion and neck supple. Tenderness present.  Skin:  General: Skin is warm and dry.     Capillary Refill: Capillary refill takes less than 2 seconds.  Neurological:     General: No focal deficit present.     Mental Status: She is alert. Mental status is at baseline.  Psychiatric:        Mood and Affect: Mood normal.        Behavior: Behavior normal.        Thought Content: Thought content normal.        Judgment: Judgment normal.    Assessment & Plan:   See Encounters Tab for problem based charting.  Patient seen with Dr. Jimmye Norman

## 2020-03-02 NOTE — Progress Notes (Signed)
Internal Medicine Clinic Attending  Case discussed with Dr. Wynetta Emery  At the time of the visit.  We reviewed the resident's history and exam and pertinent patient test results.  I agree with the assessment, diagnosis, and plan of care documented in the resident's note. Ms. Baity pain has features of fibromyalgia or other myofacial pain syndrome.  Her pain has caused progressive stiffening and difficulty with comfortable daily activities.  Important to begin movement based therapy.
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
## 2020-03-02 NOTE — Patient Instructions (Signed)
[name],  It was a pleasure meeting you in clinic today. I am sorry that you are not feeling well. We have some new ideas for you to try to help your pain.  1. Please contact the facility, [facility], [phone] to see if they will take your insurance to evaluate your condition and treat.  2. I have written a prescription for BioFreeze and Lidocaine patches. These are additional, non-oral, options for you to try to manage your pain.  3. I placed a new referral to physical therapy to attempt to re-establish care since there were some scheduling conflicts last time.  4. We will hold off on pursuing carpal tunnel release surgery at this time, however, we are happy to place a new referral for orthopedics if you desire in the future,  Otherwise, please call our office if you have any new questions or concerns.  Sincerely, Dr. Fausto Skillern, MD

## 2020-03-05 ENCOUNTER — Telehealth: Payer: Self-pay | Admitting: Student

## 2020-03-05 NOTE — Telephone Encounter (Signed)
Refill Request-Patietn states she has not rec'd her Pain Patches.  OV 03/02/2020 with Dr. Wynetta Emery.  Lidocaine (HM LIDOCAINE PATCH) 4 % PTCH  [id number] - [place], [place] - [address] AT [address]

## 2020-03-05 NOTE — Telephone Encounter (Signed)
[facility] stated lidocaine patches (also biofreeze) can be purchased OTC which would be cheaper.  I called and informed pt of this; also if she has any problem finding to ask the pharmacist Voiced understanding.Marland Kitchen

## 2020-03-08 NOTE — Telephone Encounter (Signed)
Great. Thank you.

## 2020-03-10 DIAGNOSIS — M25562 Pain in left knee: Secondary | ICD-10-CM | POA: Diagnosis not present

## 2020-03-10 DIAGNOSIS — M5459 Other low back pain: Secondary | ICD-10-CM | POA: Diagnosis not present

## 2020-03-10 DIAGNOSIS — M25561 Pain in right knee: Secondary | ICD-10-CM | POA: Diagnosis not present

## 2020-03-23 ENCOUNTER — Other Ambulatory Visit: Payer: Self-pay | Admitting: Neurology

## 2020-03-29 ENCOUNTER — Other Ambulatory Visit: Payer: Self-pay | Admitting: Student

## 2020-03-29 DIAGNOSIS — Z1231 Encounter for screening mammogram for malignant neoplasm of breast: Secondary | ICD-10-CM

## 2020-03-30 ENCOUNTER — Encounter: Payer: Self-pay | Admitting: Internal Medicine

## 2020-03-30 ENCOUNTER — Other Ambulatory Visit: Payer: Self-pay

## 2020-03-30 ENCOUNTER — Ambulatory Visit (INDEPENDENT_AMBULATORY_CARE_PROVIDER_SITE_OTHER): Payer: Self-pay | Admitting: Internal Medicine

## 2020-03-30 VITALS — BP 155/98 | HR 81 | Wt 167.1 lb

## 2020-03-30 DIAGNOSIS — G959 Disease of spinal cord, unspecified: Secondary | ICD-10-CM

## 2020-03-30 DIAGNOSIS — M797 Fibromyalgia: Secondary | ICD-10-CM

## 2020-03-30 DIAGNOSIS — M5412 Radiculopathy, cervical region: Secondary | ICD-10-CM

## 2020-03-30 MED ORDER — TRAMADOL HCL 50 MG PO TABS: 50.0000 mg | ORAL_TABLET | Freq: Four times a day (QID) | ORAL | 0 refills | Status: DC | PRN

## 2020-03-30 NOTE — Patient Instructions (Signed)
Thank you, Ms.[name] for allowing [place] to provide your care today. Today we discussed Fibromyalgia.    I have ordered the following labs for you:  Lab Orders  No laboratory test(s) ordered today     Tests ordered today:  none  Referrals ordered today:   Referral for Counseling   I have ordered the following medication/changed the following medications:   Stop the following medications: There are no discontinued medications.   Start the following medications: No orders of the defined types were placed in this encounter.    Follow up: with PCP    Remember:   Should you have any questions or concerns please call the internal medicine clinic at [phone].     Marianna Payment, D.O. [place]

## 2020-03-30 NOTE — Progress Notes (Signed)
CC: Fibromyalgia  HPI:  [name] is a 54 y.o. female with a past medical history stated below and presents today for fibromyalgia. Please see problem based assessment and plan for additional details.  Past Medical History:  Diagnosis Date  . Anxiety   . Asthma   . Biliary dyskinesia    a. s/p cholecystectomy.  Marland Kitchen BMI 40.0-44.9, adult ([place]) 06/11/2015  . 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 Abnl stress [place] 2008, f/u cath reportedly nl. b. ETT-Myoview 04/2011 - EKG changes but normal perfusion. Cor CT - no coronary calcium, no definite stenosis though mRCA not fully evaluated. c. 10/2011 - tn elevated in [place], LHC without CAD. Started on Ranexa, anti-anginals ?microvascular dz but later stopped while in hospital on abx.  . Complication of anesthesia    hard to wake up-had to be reminded to breath  . Diarrhea 08/09/2017  . GERD (gastroesophageal reflux disease)    a. Severe.  Marland Kitchen History of seizure 10/19/2017  . HTN (hypertension)   . Hx of cardiovascular stress test    [facility] Myoview 8/14:  Normal, EF 74%  . Hx of echocardiogram    Echo 3/16:  Mild LVH, EF 55-60%, Gr 1 DD, trivial MR, mild LAE, normal RVF  . Hydradenitis 11/09/2016  . Hypothyroid   . Hypothyroidism 07/02/2013   Overview:  Last Assessment & Plan:   She reports a 21 pound increase in her weight since October. She doesn't know when the last time her TSH was checked so we will do that today.  . Low back pain 04/19/2016  . MI (myocardial infarction) ([place])   . Migraine   . Morbid obesity ([place]) 07/26/2017  . MRSA infection    a. After vagal nerve stimulator at [facility] - surgical site MRSA infection, PICC placed.  . Obesity   . OSA (obstructive sleep apnea) 07/26/2017   uses CPAP nightly  . Palpitations    a. 08/2014: 48 hour holter with 2 PVCs otherwise normal.  . Pre-diabetes   . Rectal pain 12/19/2017  . Seizure disorder ([place])    a. since childhood. b. s/p vagal nerve stimulator at [facility].   . Seizures ([place])     Current Outpatient Medications on File Prior to Visit  Medication Sig Dispense Refill  . acetaminophen (TYLENOL) 325 MG tablet Take 650 mg by mouth every 6 (six) hours as needed for headache.     . albuterol (PROVENTIL HFA;VENTOLIN HFA) 108 (90 Base) MCG/ACT inhaler Inhale 2 puffs into the lungs every 6 (six) hours as needed for wheezing or shortness of breath. 1 Inhaler 6  . butalbital-acetaminophen-caffeine (FIORICET) 50-325-40 MG tablet Take one tablet daily if needed for acute migraine. #10 per 30 days. No early refills. 10 tablet 0  . carvedilol (COREG) 25 MG tablet Take 25 mg by mouth 2 (two) times daily.  0  . cyclobenzaprine (FLEXERIL) 5 MG tablet Take 1 tablet (5 mg total) by mouth 3 (three) times daily as needed for muscle spasms. 30 tablet 0  . diclofenac Sodium (VOLTAREN) 1 % GEL APPLY 4 GRAMS TOPICALLY 4 (FOUR) TIMES DAILY. 300 g 10  . diltiazem (CARDIZEM CD) 120 MG 24 hr capsule Take 1 capsule (120 mg total) by mouth every evening. 90 capsule 2  . fexofenadine (ALLEGRA) 180 MG tablet Take 180 mg by mouth daily.    . fluconazole (DIFLUCAN) 150 MG tablet Take 1 tablet (150 mg total) by mouth daily. 1 tablet 1  .  fluticasone (FLONASE) 50 MCG/ACT nasal spray Place into both nostrils daily.    Marland Kitchen gabapentin (NEURONTIN) 600 MG tablet Take 1 tablet (600 mg total) by mouth 3 (three) times daily. 90 tablet 2  . Galcanezumab-gnlm (EMGALITY) 120 MG/ML SOAJ Inject 120 mg into the skin every 30 (thirty) days. 1 mL 11  . levothyroxine (SYNTHROID) 50 MCG tablet Take 1 tablet (50 mcg total) by mouth daily. 90 tablet 1  . Lidocaine (HM LIDOCAINE PATCH) 4 % PTCH Apply 1 patch topically daily. 15 patch 3  . lidocaine-prilocaine (EMLA) cream APPLY 1 APPLICATION TOPICALLY AS NEEDED. 30 g 0  . lisinopril (ZESTRIL) 20 MG tablet Take 1 tablet (20 mg total) by mouth daily. 90 tablet 1  . meloxicam (MOBIC) 7.5 MG tablet Take 1 tablet (7.5 mg total) by mouth 2 (two) times daily as  needed for pain. 30 tablet 0  . Menthol, Topical Analgesic, (BIOFREEZE) 4 % GEL Apply 1 application topically 2 (two) times daily. 118 mL 0  . mometasone-formoterol (DULERA) 100-5 MCG/ACT AERO Inhale 2 puffs into the lungs 2 (two) times daily. 1 Inhaler 2  . Multiple Vitamins-Minerals (ADEKS PO) Take 1 tablet by mouth daily.    . nitroGLYCERIN (NITRODUR - DOSED IN MG/24 HR) 0.2 mg/hr patch PLACE 1 PATCH ONTO THE SKIN DAILY. 30 patch 1  . nitroGLYCERIN (NITROSTAT) 0.4 MG SL tablet PLACE 1 TAB UNDER TONGUE EVERY 5 MINUTES AS NEEDED FOR CHEST PAIN, MAX 3/15 MINS 25 tablet 6  . spironolactone (ALDACTONE) 25 MG tablet Take 1 tablet (25 mg total) by mouth daily. 90 tablet 3  . topiramate (TOPAMAX) 200 MG tablet Take 1 tablet (200 mg total) by mouth 2 (two) times daily. 180 tablet 4  . traZODone (DESYREL) 100 MG tablet Take 100 mg by mouth at bedtime.    Marland Kitchen Ubrogepant (UBRELVY) 50 MG TABS Take 50 mg by mouth as directed. Take 1 tab at onset of migraine. May repeat in 2 hrs, if needed. Max dose: 2 tabs/day. This is a 30 day prescription. 12 tablet 11  . venlafaxine XR (EFFEXOR-XR) 75 MG 24 hr capsule TAKE 1 CAPSULE BY MOUTH EVERY DAY WITH BREAKFAST 90 capsule 1  . VITAMIN D, CHOLECALCIFEROL, PO Take 1 tablet by mouth once a week.      No current facility-administered medications on file prior to visit.    Family History  Problem Relation Age of Onset  . Coronary artery disease Mother 19  . Other Father        killed  . Kidney disease Father   . Coronary artery disease Maternal Grandmother   . Cancer Other   . Hypertension Other   . Stroke Other   . Breast cancer Neg Hx     Social History   Socioeconomic History  . Marital status: Single    Spouse name: Not on file  . Number of children: 2  . Years of education: college  . Highest education level: Associate degree: academic program  Occupational History  . Occupation: Disabled  Tobacco Use  . Smoking status: Never Smoker  . Smokeless  tobacco: Never Used  Vaping Use  . Vaping Use: Never used  Substance and Sexual Activity  . Alcohol use: Yes    Alcohol/week: 1.0 - 3.0 standard drink    Types: 1 - 3 Glasses of wine per week    Comment: occasional  . Drug use: No  . Sexual activity: Not on file  Other Topics Concern  . Not on file  Social History Narrative   Lives at home with her daughter.   Right-handed.   No caffeine per day.   Social Determinants of Health   Financial Resource Strain:   . Difficulty of Paying Living Expenses: Not on file  Food Insecurity:   . Worried About Charity fundraiser in the Last Year: Not on file  . Ran Out of Food in the Last Year: Not on file  Transportation Needs:   . Lack of Transportation (Medical): Not on file  . Lack of Transportation (Non-Medical): Not on file  Physical Activity:   . Days of Exercise per Week: Not on file  . Minutes of Exercise per Session: Not on file  Stress:   . Feeling of Stress : Not on file  Social Connections:   . Frequency of Communication with Friends and Family: Not on file  . Frequency of Social Gatherings with Friends and Family: Not on file  . Attends Religious Services: Not on file  . Active Member of Clubs or Organizations: Not on file  . Attends Archivist Meetings: Not on file  . Marital Status: Not on file  Intimate Partner Violence:   . Fear of Current or Ex-Partner: Not on file  . Emotionally Abused: Not on file  . Physically Abused: Not on file  . Sexually Abused: Not on file    Review of Systems: ROS negative except for what is noted on the assessment and plan.  Vitals:   03/30/20 0941  BP: (!) 155/98  Pulse: 81  SpO2: 100%  Weight: 167 lb 1.6 oz (75.8 kg)     Physical Exam: Physical Exam Constitutional:      Appearance: Normal appearance.  HENT:     Head: Normocephalic and atraumatic.  Cardiovascular:     Rate and Rhythm: Normal rate.     Pulses: Normal pulses.     Heart sounds: Normal heart  sounds.  Pulmonary:     Effort: Pulmonary effort is normal.     Breath sounds: Normal breath sounds.  Musculoskeletal:        General: Tenderness (diffuse tenderness throughout neck, shoulders, arms, back and legs. ) present. Normal range of motion.     Cervical back: Normal range of motion. Tenderness (TTP) present.  Skin:    General: Skin is warm and dry.  Neurological:     General: No focal deficit present.     Mental Status: She is alert. Mental status is at baseline.  Psychiatric:        Mood and Affect: Mood is depressed.      Assessment & Plan:   See Encounters Tab for problem based charting.  Patient discussed with Dr. Bertell Maria, D.O. [facility], PGY-2 Pager: [phone], Phone: [phone] Date 03/31/2020 Time 5:39 AM

## 2020-03-31 ENCOUNTER — Encounter: Payer: Self-pay | Admitting: Internal Medicine

## 2020-03-31 ENCOUNTER — Ambulatory Visit
Admission: RE | Admit: 2020-03-31 | Discharge: 2020-03-31 | Disposition: A | Discharge status: Home / Self Care | Payer: Medicare Other | Source: Ambulatory Visit | Attending: Internal Medicine | Admitting: Internal Medicine

## 2020-03-31 DIAGNOSIS — Z1231 Encounter for screening mammogram for malignant neoplasm of breast: Secondary | ICD-10-CM
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
## 2020-04-01 ENCOUNTER — Ambulatory Visit: Payer: Medicare Other | Attending: Orthopedic Surgery | Admitting: Physical Therapy

## 2020-04-01 ENCOUNTER — Encounter: Payer: Self-pay | Admitting: Physical Therapy

## 2020-04-01 ENCOUNTER — Other Ambulatory Visit: Payer: Self-pay

## 2020-04-01 DIAGNOSIS — M542 Cervicalgia: Secondary | ICD-10-CM | POA: Diagnosis not present

## 2020-04-01 DIAGNOSIS — R262 Difficulty in walking, not elsewhere classified: Secondary | ICD-10-CM | POA: Insufficient documentation

## 2020-04-01 DIAGNOSIS — G8929 Other chronic pain: Secondary | ICD-10-CM | POA: Diagnosis not present

## 2020-04-01 DIAGNOSIS — M62830 Muscle spasm of back: Secondary | ICD-10-CM

## 2020-04-01 DIAGNOSIS — M6281 Muscle weakness (generalized): Secondary | ICD-10-CM | POA: Insufficient documentation

## 2020-04-01 DIAGNOSIS — M545 Low back pain, unspecified: Secondary | ICD-10-CM | POA: Diagnosis not present
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
## 2020-04-04 ENCOUNTER — Other Ambulatory Visit: Payer: Self-pay | Admitting: Internal Medicine

## 2020-04-04 DIAGNOSIS — M8949 Other hypertrophic osteoarthropathy, multiple sites: Secondary | ICD-10-CM

## 2020-04-04 DIAGNOSIS — M159 Polyosteoarthritis, unspecified: Secondary | ICD-10-CM

## 2020-04-05 ENCOUNTER — Ambulatory Visit: Payer: Medicare Other | Attending: Orthopedic Surgery

## 2020-04-05 ENCOUNTER — Other Ambulatory Visit: Payer: Self-pay

## 2020-04-05 DIAGNOSIS — M62830 Muscle spasm of back: Secondary | ICD-10-CM | POA: Diagnosis not present

## 2020-04-05 DIAGNOSIS — G8929 Other chronic pain: Secondary | ICD-10-CM | POA: Diagnosis not present

## 2020-04-05 DIAGNOSIS — M6281 Muscle weakness (generalized): Secondary | ICD-10-CM | POA: Diagnosis not present

## 2020-04-05 DIAGNOSIS — R262 Difficulty in walking, not elsewhere classified: Secondary | ICD-10-CM

## 2020-04-05 DIAGNOSIS — M545 Low back pain, unspecified: Secondary | ICD-10-CM | POA: Diagnosis not present

## 2020-04-05 DIAGNOSIS — F4310 Post-traumatic stress disorder, unspecified: Secondary | ICD-10-CM | POA: Diagnosis not present

## 2020-04-05 DIAGNOSIS — F331 Major depressive disorder, recurrent, moderate: Secondary | ICD-10-CM | POA: Diagnosis not present

## 2020-04-05 DIAGNOSIS — F411 Generalized anxiety disorder: Secondary | ICD-10-CM | POA: Diagnosis not present
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
## 2020-04-05 NOTE — Progress Notes (Signed)
Internal Medicine Clinic Attending  Case discussed with Dr. Coe  At the time of the visit.  We reviewed the resident's history and exam and pertinent patient test results.  I agree with the assessment, diagnosis, and plan of care documented in the resident's note.  

## 2020-04-07 DIAGNOSIS — G4733 Obstructive sleep apnea (adult) (pediatric): Secondary | ICD-10-CM | POA: Diagnosis not present

## 2020-04-08 DIAGNOSIS — B351 Tinea unguium: Secondary | ICD-10-CM | POA: Diagnosis not present

## 2020-04-08 DIAGNOSIS — M792 Neuralgia and neuritis, unspecified: Secondary | ICD-10-CM | POA: Diagnosis not present

## 2020-04-08 DIAGNOSIS — B353 Tinea pedis: Secondary | ICD-10-CM | POA: Diagnosis not present

## 2020-04-12 ENCOUNTER — Other Ambulatory Visit: Payer: Self-pay | Admitting: Internal Medicine

## 2020-04-12 DIAGNOSIS — G959 Disease of spinal cord, unspecified: Secondary | ICD-10-CM

## 2020-04-12 DIAGNOSIS — M5412 Radiculopathy, cervical region: Secondary | ICD-10-CM

## 2020-04-13 ENCOUNTER — Other Ambulatory Visit: Payer: Self-pay

## 2020-04-13 ENCOUNTER — Ambulatory Visit: Payer: Medicare Other

## 2020-04-13 ENCOUNTER — Telehealth: Payer: Self-pay | Admitting: Orthopaedic Surgery

## 2020-04-13 DIAGNOSIS — M6281 Muscle weakness (generalized): Secondary | ICD-10-CM

## 2020-04-13 DIAGNOSIS — R262 Difficulty in walking, not elsewhere classified: Secondary | ICD-10-CM | POA: Diagnosis not present

## 2020-04-13 DIAGNOSIS — M62830 Muscle spasm of back: Secondary | ICD-10-CM | POA: Diagnosis not present

## 2020-04-13 DIAGNOSIS — G8929 Other chronic pain: Secondary | ICD-10-CM | POA: Diagnosis not present

## 2020-04-13 DIAGNOSIS — M545 Low back pain, unspecified: Secondary | ICD-10-CM

## 2020-04-13 NOTE — Telephone Encounter (Signed)
[name] CALLED ASKING ABOUT A FAX HE SENT OVER 15 DAY AGO.   MEDICAL RECORDS December 13 2018 October 10th 2021
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
## 2020-04-13 NOTE — Telephone Encounter (Signed)
Received, sent to [facility]

## 2020-04-15 ENCOUNTER — Ambulatory Visit (INDEPENDENT_AMBULATORY_CARE_PROVIDER_SITE_OTHER): Payer: Medicare Other | Admitting: Neurology

## 2020-04-15 ENCOUNTER — Telehealth: Payer: Self-pay | Admitting: *Deleted

## 2020-04-15 ENCOUNTER — Encounter: Payer: Self-pay | Admitting: Neurology

## 2020-04-15 VITALS — BP 130/86 | HR 77 | Ht 65.0 in | Wt 165.5 lb

## 2020-04-15 DIAGNOSIS — G5603 Carpal tunnel syndrome, bilateral upper limbs: Secondary | ICD-10-CM

## 2020-04-15 DIAGNOSIS — Z9689 Presence of other specified functional implants: Secondary | ICD-10-CM | POA: Diagnosis not present

## 2020-04-15 DIAGNOSIS — G43709 Chronic migraine without aura, not intractable, without status migrainosus: Secondary | ICD-10-CM | POA: Diagnosis not present

## 2020-04-15 DIAGNOSIS — R569 Unspecified convulsions: Secondary | ICD-10-CM
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
## 2020-04-15 NOTE — Telephone Encounter (Signed)
VNS referral form for replacement completed, signed by Dr.Yan and faxed to [facility] at [phone]. Last two office notes, copy of insurance card and demographics included with referral. Received confirmation of receipt.

## 2020-04-17 LAB — TOPIRAMATE LEVEL: Topiramate Lvl: 8.2 ug/mL (ref 2.0–25.0)

## 2020-04-19 ENCOUNTER — Ambulatory Visit: Payer: Medicare Other

## 2020-04-19 ENCOUNTER — Other Ambulatory Visit: Payer: Self-pay

## 2020-04-19 DIAGNOSIS — R262 Difficulty in walking, not elsewhere classified: Secondary | ICD-10-CM

## 2020-04-19 DIAGNOSIS — M62830 Muscle spasm of back: Secondary | ICD-10-CM | POA: Diagnosis not present

## 2020-04-19 DIAGNOSIS — G8929 Other chronic pain: Secondary | ICD-10-CM

## 2020-04-19 DIAGNOSIS — M545 Low back pain, unspecified: Secondary | ICD-10-CM

## 2020-04-19 DIAGNOSIS — M6281 Muscle weakness (generalized): Secondary | ICD-10-CM
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
## 2020-04-20 ENCOUNTER — Other Ambulatory Visit (HOSPITAL_COMMUNITY)
Admission: RE | Admit: 2020-04-20 | Discharge: 2020-04-20 | Disposition: A | Discharge status: Home / Self Care | Payer: Medicare Other | Source: Ambulatory Visit | Attending: Advanced Practice Midwife | Admitting: Advanced Practice Midwife

## 2020-04-20 ENCOUNTER — Ambulatory Visit (INDEPENDENT_AMBULATORY_CARE_PROVIDER_SITE_OTHER): Payer: Medicare Other | Admitting: Advanced Practice Midwife

## 2020-04-20 VITALS — BP 128/88 | HR 85 | Ht 65.0 in | Wt 161.0 lb

## 2020-04-20 DIAGNOSIS — Z01419 Encounter for gynecological examination (general) (routine) without abnormal findings: Secondary | ICD-10-CM | POA: Insufficient documentation

## 2020-04-20 DIAGNOSIS — N951 Menopausal and female climacteric states: Secondary | ICD-10-CM | POA: Diagnosis not present

## 2020-04-20 DIAGNOSIS — L732 Hidradenitis suppurativa: Secondary | ICD-10-CM

## 2020-04-20 DIAGNOSIS — N898 Other specified noninflammatory disorders of vagina: Secondary | ICD-10-CM

## 2020-04-20 DIAGNOSIS — L02818 Cutaneous abscess of other sites: Secondary | ICD-10-CM

## 2020-04-20 DIAGNOSIS — N952 Postmenopausal atrophic vaginitis: Secondary | ICD-10-CM

## 2020-04-20 MED ORDER — GABAPENTIN 600 MG PO TABS: 600.0000 mg | ORAL_TABLET | Freq: Every day | ORAL | 11 refills | Status: DC

## 2020-04-20 MED ORDER — CLINDAMYCIN PHOSPHATE 1 % EX SWAB: 1.0000 "application " | Freq: Two times a day (BID) | CUTANEOUS | 5 refills | Status: DC

## 2020-04-20 MED ORDER — SULFAMETHOXAZOLE-TRIMETHOPRIM 400-80 MG PO TABS: 1.0000 | ORAL_TABLET | Freq: Two times a day (BID) | ORAL | 0 refills | Status: AC

## 2020-04-20 NOTE — Progress Notes (Signed)
Subjective:     [name] is a 54 y.o. female here at [facility] for a routine exam.  Current complaints: vasomotor and vaginal symptoms related to menopause.  Personal health questionnaire reviewed: yes.  Do you have a primary care provider? yes Do you feel safe at home? yes    Office Visit from 04/20/2020 in [facility]  PHQ-2 Total Score 4      Risk factors for chronic health problems: Smoking: No  Alchohol/how much: 1-3/week Pt BMI: Body mass index is 26.79 kg/m.   Gynecologic History Patient's last menstrual period was 04/13/2018. Contraception: post menopausal status Last Pap: 2020. Results were: ASCUS with negative hpv Last mammogram: 2021. Results were: normal  Obstetric History OB History  Gravida Para Term Preterm AB Living  3 3 3     3   SAB TAB Ectopic Multiple Live Births               # Outcome Date GA Lbr Len/2nd Weight Sex Delivery Anes PTL Lv  3 Term 01/12/05     CS-Unspec     2 Term 10/15/86     Vag-Spont     1 Term 06/03/83     Vag-Spont        The following portions of the patient's history were reviewed and updated as appropriate: allergies, current medications, past family history, past medical history, past social history, past surgical history and problem list.  Review of Systems Pertinent items noted in HPI and remainder of comprehensive ROS otherwise negative.    Objective:   BP 128/88   Pulse 85   Ht 5\' 5"  (1.651 m)   Wt 161 lb (73 kg)   LMP 04/13/2018   BMI 26.79 kg/m  VS reviewed, nursing note reviewed,  Constitutional: well developed, well nourished, no distress HEENT: normocephalic CV: normal rate Pulm/chest wall: normal effort Breast Exam:   performed: right breast normal without mass, skin or nipple changes or axillary nodes, left breast normal without mass, skin or nipple changes or axillary nodes Abdomen: soft Neuro: alert and oriented x 3 Skin: warm, dry Psych: affect normal Pelvic exam:  Performed: Cervix pink, visually closed, without lesion, scant white creamy discharge, vaginal walls normal. 2-4 firm, smooth mobile masses in mons and groin area c/w small abscesses Bimanual exam: Cervix 0/long/high, firm, anterior, neg CMT, uterus nontender, nonenlarged, adnexa without tenderness, enlargement, or mass       Assessment/Plan:   1. Well woman exam with routine gynecological exam --Per ASCCP guidelines, Pap needed in 2023 with ASCUS negative hpv in 2020. Pt concerned, family hx of cancer.  Discussed reasons for guidelines as low 5 year risk for cervical cancer. With shared decision making, Pap performed today.  - Cytology - PAP( [place])  2. Vaginal irritation --Likely atrophic vaginitis, will collect swab for infection - Cervicovaginal ancillary only( [place])  3. Hydradenitis --pt with recurrent boils in groin areas with hair, buttocks, and axilla --Discussed diagnosis, and  - clindamycin (CLINDACIN-P) 1 % SWAB; Apply 1 application topically 2 (two) times daily.  Dispense: 60 each; Refill: 5  4. Vasomotor symptoms due to menopause --Increase gabapentin to 1200 mg at night. Pt takes 600 TID for neurological symptoms. Continue 600 in am and midday but increase bedtime dose. -F/U in 3 months with MD to discuss safety of hormone replacement given comorbidities   5. Atrophic vaginitis --Pt to try vaginal moisturizers, given printed and verbal information --F/U in 3 months with  MD to discuss safety of hormone replacement given comorbidities  6. Cutaneous abscess of other site --Rx for Bactrim for one time treatment of larger, more painful boils. - sulfamethoxazole-trimethoprim (BACTRIM) 400-80 MG tablet; Take 1 tablet by mouth 2 (two) times daily for 7 days.  Dispense: 14 tablet; Refill: 0   Follow up in: 3 months or as needed.   Fatima Blank, CNM 11:45 AM

## 2020-04-20 NOTE — Patient Instructions (Signed)
Meet the Provider [facility] for [facility] is now offering FREE monthly 1-hour virtual Zoom sessions for new, current, and prospective patients.        During these sessions, you can:   Learn about our practice, model of care, services   Get answers to questions about pregnancy and birth during [place] your provider's brain about anything else!    Sessions will be hosted by [employer] for [employer], Engineer, materials, Physicians and Midwives          No registration required      2021 Dates:      All at 6pm     October 21st     November 18th   December 16th     January 20th  February 17th    To join one of these meetings, a few minutes before it is set to start:     Copy/paste the link into your web browser:  [email or web address]    OR  Scan the QR code below (open up your camera and point towards QR code; click on tab that pops up on your phone ("zoom")

## 2020-04-20 NOTE — Progress Notes (Signed)
PHQ-9 score today is 16, pt does have PCP that helps to manage.  Pt complains of vaginal irritation/dryness. Pt is up to date on mammo 03/2020- normal.

## 2020-04-21 DIAGNOSIS — M25551 Pain in right hip: Secondary | ICD-10-CM | POA: Diagnosis not present

## 2020-04-21 LAB — CERVICOVAGINAL ANCILLARY ONLY
Bacterial Vaginitis (gardnerella): POSITIVE — AB
Candida Glabrata: NEGATIVE
Candida Vaginitis: NEGATIVE
Chlamydia: NEGATIVE
Comment: NEGATIVE
Comment: NEGATIVE
Comment: NEGATIVE
Comment: NEGATIVE
Comment: NEGATIVE
Comment: NORMAL
Neisseria Gonorrhea: NEGATIVE
Trichomonas: NEGATIVE

## 2020-04-23 ENCOUNTER — Other Ambulatory Visit: Payer: Self-pay | Admitting: Advanced Practice Midwife

## 2020-04-23 DIAGNOSIS — B9689 Other specified bacterial agents as the cause of diseases classified elsewhere: Secondary | ICD-10-CM

## 2020-04-23 DIAGNOSIS — N760 Acute vaginitis: Secondary | ICD-10-CM

## 2020-04-23 LAB — CYTOLOGY - PAP
Comment: NEGATIVE
Diagnosis: NEGATIVE
High risk HPV: NEGATIVE

## 2020-04-23 MED ORDER — METRONIDAZOLE 500 MG PO TABS: 500.0000 mg | ORAL_TABLET | Freq: Two times a day (BID) | ORAL | 0 refills | Status: AC

## 2020-04-26 NOTE — Progress Notes (Signed)
I have reviewed and agreed above plan. 

## 2020-04-27 ENCOUNTER — Ambulatory Visit: Payer: Medicare Other

## 2020-04-27 DIAGNOSIS — M810 Age-related osteoporosis without current pathological fracture: Secondary | ICD-10-CM | POA: Diagnosis not present

## 2020-04-27 DIAGNOSIS — M25561 Pain in right knee: Secondary | ICD-10-CM | POA: Diagnosis not present

## 2020-05-12 ENCOUNTER — Other Ambulatory Visit: Payer: Self-pay | Admitting: Orthopedic Surgery

## 2020-05-12 DIAGNOSIS — M542 Cervicalgia: Secondary | ICD-10-CM

## 2020-05-12 DIAGNOSIS — G8929 Other chronic pain: Secondary | ICD-10-CM

## 2020-05-12 DIAGNOSIS — M545 Low back pain, unspecified: Secondary | ICD-10-CM

## 2020-05-13 ENCOUNTER — Ambulatory Visit: Payer: Medicare Other | Attending: Orthopedic Surgery

## 2020-05-13 ENCOUNTER — Other Ambulatory Visit: Payer: Self-pay

## 2020-05-13 ENCOUNTER — Telehealth: Payer: Self-pay

## 2020-05-13 DIAGNOSIS — R262 Difficulty in walking, not elsewhere classified: Secondary | ICD-10-CM | POA: Diagnosis not present

## 2020-05-13 DIAGNOSIS — M5416 Radiculopathy, lumbar region: Secondary | ICD-10-CM | POA: Diagnosis not present

## 2020-05-13 DIAGNOSIS — M62830 Muscle spasm of back: Secondary | ICD-10-CM | POA: Diagnosis not present

## 2020-05-13 DIAGNOSIS — G8929 Other chronic pain: Secondary | ICD-10-CM

## 2020-05-13 DIAGNOSIS — M542 Cervicalgia: Secondary | ICD-10-CM

## 2020-05-13 DIAGNOSIS — M546 Pain in thoracic spine: Secondary | ICD-10-CM | POA: Diagnosis not present

## 2020-05-13 DIAGNOSIS — M6281 Muscle weakness (generalized): Secondary | ICD-10-CM

## 2020-05-13 DIAGNOSIS — M545 Low back pain, unspecified: Secondary | ICD-10-CM

## 2020-05-13 NOTE — Telephone Encounter (Signed)
Phone call to patient to verify medication list and allergies for myelogram procedure. Pt instructed to hold Tramadol, Trazodone, and Prozac for 48hrs prior to myelogram appointment time and 24 hours after appointment. Pt also instructed to have a driver the day of the procedure, the procedure would take around 2 hours, and discharge instructions discussed. Pt verbalized understanding.
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
## 2020-05-14 DIAGNOSIS — N200 Calculus of kidney: Secondary | ICD-10-CM | POA: Diagnosis not present

## 2020-05-14 DIAGNOSIS — R1084 Generalized abdominal pain: Secondary | ICD-10-CM | POA: Diagnosis not present

## 2020-05-18 ENCOUNTER — Ambulatory Visit: Payer: Medicare Other | Admitting: Neurology

## 2020-05-19 ENCOUNTER — Ambulatory Visit: Payer: Medicare Other | Admitting: Physical Therapy

## 2020-05-20 DIAGNOSIS — M25561 Pain in right knee: Secondary | ICD-10-CM | POA: Diagnosis not present

## 2020-05-21 ENCOUNTER — Ambulatory Visit
Admission: RE | Admit: 2020-05-21 | Discharge: 2020-05-21 | Disposition: A | Discharge status: Home / Self Care | Payer: Self-pay | Source: Ambulatory Visit | Attending: Orthopedic Surgery | Admitting: Orthopedic Surgery

## 2020-05-21 ENCOUNTER — Other Ambulatory Visit: Payer: Self-pay

## 2020-05-21 DIAGNOSIS — M545 Low back pain, unspecified: Secondary | ICD-10-CM

## 2020-05-21 DIAGNOSIS — M542 Cervicalgia: Secondary | ICD-10-CM

## 2020-05-21 DIAGNOSIS — M5126 Other intervertebral disc displacement, lumbar region: Secondary | ICD-10-CM | POA: Diagnosis not present

## 2020-05-21 DIAGNOSIS — M4802 Spinal stenosis, cervical region: Secondary | ICD-10-CM | POA: Diagnosis not present

## 2020-05-21 DIAGNOSIS — G8929 Other chronic pain: Secondary | ICD-10-CM

## 2020-05-21 DIAGNOSIS — M48061 Spinal stenosis, lumbar region without neurogenic claudication: Secondary | ICD-10-CM | POA: Diagnosis not present

## 2020-05-21 DIAGNOSIS — M4312 Spondylolisthesis, cervical region: Secondary | ICD-10-CM | POA: Diagnosis not present

## 2020-05-21 MED ORDER — MEPERIDINE HCL 50 MG/ML IJ SOLN
50.0000 mg | Freq: Once | INTRAMUSCULAR | Status: AC
  Administered 2020-05-21: 50 mg via INTRAMUSCULAR

## 2020-05-21 MED ORDER — MEPERIDINE HCL 50 MG/ML IJ SOLN: 50.0000 mg | Freq: Once | INTRAMUSCULAR | Status: DC

## 2020-05-21 MED ORDER — DIAZEPAM 5 MG PO TABS
10.0000 mg | ORAL_TABLET | Freq: Once | ORAL | Status: AC
  Administered 2020-05-21: 10 mg via ORAL

## 2020-05-21 MED ORDER — IOPAMIDOL (ISOVUE-M 300) INJECTION 61%
10.0000 mL | Freq: Once | INTRAMUSCULAR | Status: AC | PRN
  Administered 2020-05-21: 10 mL via INTRATHECAL

## 2020-05-21 MED ORDER — ONDANSETRON HCL 4 MG/2ML IJ SOLN
4.0000 mg | Freq: Once | INTRAMUSCULAR | Status: AC
  Administered 2020-05-21: 4 mg via INTRAMUSCULAR

## 2020-05-21 MED ORDER — IOPAMIDOL (ISOVUE-M 200) INJECTION 41%: 10.0000 mL | Freq: Once | INTRAMUSCULAR | Status: DC

## 2020-05-21 NOTE — Discharge Instructions (Signed)
Myelogram Discharge Instructions  1. Go home and rest quietly for the next 24 hours.  It is important to lie flat for the next 24 hours.  Get up only to go to the restroom.  You may lie in the bed or on a couch on your back, your stomach, your left side or your right side.  You may have one pillow under your head.  You may have pillows between your knees while you are on your side or under your knees while you are on your back.  2. DO NOT drive today.  Recline the seat as far back as it will go, while still wearing your seat belt, on the way home.  3. You may get up to go to the bathroom as needed.  You may sit up for 10 minutes to eat.  You may resume your normal diet and medications unless otherwise indicated.  Drink lots of extra fluids today and tomorrow.  4. The incidence of headache, nausea, or vomiting is about 5% (one in 20 patients).  If you develop a headache, lie flat and drink plenty of fluids until the headache goes away.  Caffeinated beverages may be helpful.  If you develop severe nausea and vomiting or a headache that does not go away with flat bed rest, call [phone].  5. You may resume normal activities after your 24 hours of bed rest is over; however, do not exert yourself strongly or do any heavy lifting tomorrow. If when you get up you have a headache when standing, go back to bed and force fluids for another 24 hours.  6. Call your physician for a follow-up appointment.  The results of your myelogram will be sent directly to your physician by the following day.  7. If you have any questions or if complications develop after you arrive home, please call [phone].  Discharge instructions have been explained to the patient.  The patient, or the person responsible for the patient, fully understands these instructions   YOU MAY TAKE YOUR TRAMADOL, TRAZODONE, AND PROZAC ON 05/21/20 AT 9:30AM WHICH IS TOMORROW.

## 2020-05-21 NOTE — Progress Notes (Signed)
Pt reports she has been off of tramadol, Trazodone, and Prozac for 48 hours and verbalizes understanding not to restart this medication until tomorrow on 05/22/20.

## 2020-05-21 NOTE — Discharge Instr - Other Orders (Addendum)
Pt reported severe pain.  See MAR.  1045: Pt reports relief in her pain. Pt reports pain is tolerable at this time.

## 2020-05-24 ENCOUNTER — Ambulatory Visit: Payer: Medicare Other

## 2020-05-26 DIAGNOSIS — M25561 Pain in right knee: Secondary | ICD-10-CM | POA: Diagnosis not present

## 2020-05-27 ENCOUNTER — Ambulatory Visit: Payer: Medicare Other

## 2020-05-31 ENCOUNTER — Ambulatory Visit: Payer: Medicare Other

## 2020-06-02 ENCOUNTER — Other Ambulatory Visit: Payer: Self-pay

## 2020-06-02 ENCOUNTER — Ambulatory Visit: Payer: Medicare Other

## 2020-06-02 DIAGNOSIS — M542 Cervicalgia: Secondary | ICD-10-CM | POA: Diagnosis not present

## 2020-06-02 DIAGNOSIS — M546 Pain in thoracic spine: Secondary | ICD-10-CM | POA: Diagnosis not present

## 2020-06-02 DIAGNOSIS — M62830 Muscle spasm of back: Secondary | ICD-10-CM | POA: Diagnosis not present

## 2020-06-02 DIAGNOSIS — R262 Difficulty in walking, not elsewhere classified: Secondary | ICD-10-CM

## 2020-06-02 DIAGNOSIS — M6281 Muscle weakness (generalized): Secondary | ICD-10-CM

## 2020-06-02 DIAGNOSIS — M545 Low back pain, unspecified: Secondary | ICD-10-CM

## 2020-06-02 DIAGNOSIS — M5416 Radiculopathy, lumbar region: Secondary | ICD-10-CM | POA: Diagnosis not present

## 2020-06-02 DIAGNOSIS — G8929 Other chronic pain: Secondary | ICD-10-CM | POA: Diagnosis not present
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
## 2020-06-07 ENCOUNTER — Telehealth: Payer: Self-pay | Admitting: Physical Therapy

## 2020-06-07 ENCOUNTER — Ambulatory Visit: Payer: Medicare Other | Attending: Internal Medicine | Admitting: Physical Therapy

## 2020-06-07 DIAGNOSIS — M545 Low back pain, unspecified: Secondary | ICD-10-CM | POA: Insufficient documentation

## 2020-06-07 DIAGNOSIS — G8929 Other chronic pain: Secondary | ICD-10-CM | POA: Insufficient documentation

## 2020-06-07 DIAGNOSIS — M542 Cervicalgia: Secondary | ICD-10-CM | POA: Insufficient documentation

## 2020-06-07 DIAGNOSIS — M546 Pain in thoracic spine: Secondary | ICD-10-CM | POA: Insufficient documentation

## 2020-06-07 DIAGNOSIS — M6281 Muscle weakness (generalized): Secondary | ICD-10-CM | POA: Insufficient documentation

## 2020-06-07 DIAGNOSIS — M5416 Radiculopathy, lumbar region: Secondary | ICD-10-CM | POA: Insufficient documentation

## 2020-06-07 DIAGNOSIS — M62830 Muscle spasm of back: Secondary | ICD-10-CM | POA: Insufficient documentation

## 2020-06-07 DIAGNOSIS — R262 Difficulty in walking, not elsewhere classified: Secondary | ICD-10-CM | POA: Insufficient documentation

## 2020-06-07 NOTE — Telephone Encounter (Signed)
Patient contacted regarding no-show appointment. Patient reports her power was out. She will be here on Wednesday.

## 2020-06-09 ENCOUNTER — Other Ambulatory Visit: Payer: Self-pay | Admitting: Internal Medicine

## 2020-06-09 ENCOUNTER — Ambulatory Visit: Payer: Medicare Other

## 2020-06-09 ENCOUNTER — Other Ambulatory Visit: Payer: Self-pay

## 2020-06-09 DIAGNOSIS — M6281 Muscle weakness (generalized): Secondary | ICD-10-CM

## 2020-06-09 DIAGNOSIS — M5412 Radiculopathy, cervical region: Secondary | ICD-10-CM

## 2020-06-09 DIAGNOSIS — M545 Low back pain, unspecified: Secondary | ICD-10-CM

## 2020-06-09 DIAGNOSIS — M546 Pain in thoracic spine: Secondary | ICD-10-CM

## 2020-06-09 DIAGNOSIS — M542 Cervicalgia: Secondary | ICD-10-CM

## 2020-06-09 DIAGNOSIS — M62830 Muscle spasm of back: Secondary | ICD-10-CM

## 2020-06-09 DIAGNOSIS — G8929 Other chronic pain: Secondary | ICD-10-CM | POA: Diagnosis not present

## 2020-06-09 DIAGNOSIS — R262 Difficulty in walking, not elsewhere classified: Secondary | ICD-10-CM | POA: Diagnosis not present

## 2020-06-09 DIAGNOSIS — M5416 Radiculopathy, lumbar region: Secondary | ICD-10-CM | POA: Diagnosis not present

## 2020-06-09 DIAGNOSIS — G959 Disease of spinal cord, unspecified: Secondary | ICD-10-CM
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
## 2020-06-09 NOTE — Telephone Encounter (Signed)
Patient has not had this medication refilled for several months. I would like her to be evaluated prior to refilling it.

## 2020-06-09 NOTE — Telephone Encounter (Addendum)
Pt informed of need for visit and actually has one scheduled for 01/07, but will now have to cancel and reschedule as she is also scheduled to see Delbert Harness (ortho) around the same time.  Pt also states she has an upcoming neurosurgery and pain clinic appt.  She will call back to reschedule her [facility] appt once she completes specialist appts.Marland KitchenMarland KitchenKingsley Spittle Cassady1/5/20221:57 PM

## 2020-06-11 ENCOUNTER — Encounter: Payer: Medicare Other | Admitting: Student

## 2020-06-11 DIAGNOSIS — M545 Low back pain, unspecified: Secondary | ICD-10-CM | POA: Diagnosis not present

## 2020-06-11 DIAGNOSIS — M542 Cervicalgia: Secondary | ICD-10-CM | POA: Diagnosis not present

## 2020-06-15 DIAGNOSIS — Z4542 Encounter for adjustment and management of neuropacemaker (brain) (peripheral nerve) (spinal cord): Secondary | ICD-10-CM | POA: Diagnosis not present

## 2020-06-15 DIAGNOSIS — M79606 Pain in leg, unspecified: Secondary | ICD-10-CM | POA: Diagnosis not present

## 2020-06-15 DIAGNOSIS — Z881 Allergy status to other antibiotic agents status: Secondary | ICD-10-CM | POA: Diagnosis not present

## 2020-06-15 DIAGNOSIS — M792 Neuralgia and neuritis, unspecified: Secondary | ICD-10-CM | POA: Diagnosis not present

## 2020-06-15 DIAGNOSIS — Z886 Allergy status to analgesic agent status: Secondary | ICD-10-CM | POA: Diagnosis not present

## 2020-06-15 DIAGNOSIS — Z91040 Latex allergy status: Secondary | ICD-10-CM | POA: Diagnosis not present

## 2020-06-15 DIAGNOSIS — M1990 Unspecified osteoarthritis, unspecified site: Secondary | ICD-10-CM | POA: Diagnosis not present

## 2020-06-15 DIAGNOSIS — M79609 Pain in unspecified limb: Secondary | ICD-10-CM | POA: Diagnosis not present

## 2020-06-15 DIAGNOSIS — Z888 Allergy status to other drugs, medicaments and biological substances status: Secondary | ICD-10-CM | POA: Diagnosis not present

## 2020-06-15 DIAGNOSIS — G8929 Other chronic pain: Secondary | ICD-10-CM | POA: Diagnosis not present

## 2020-06-15 DIAGNOSIS — G40804 Other epilepsy, intractable, without status epilepticus: Secondary | ICD-10-CM | POA: Diagnosis not present

## 2020-06-15 DIAGNOSIS — Z79891 Long term (current) use of opiate analgesic: Secondary | ICD-10-CM | POA: Diagnosis not present

## 2020-06-15 DIAGNOSIS — G894 Chronic pain syndrome: Secondary | ICD-10-CM | POA: Diagnosis not present

## 2020-06-16 DIAGNOSIS — N3946 Mixed incontinence: Secondary | ICD-10-CM | POA: Diagnosis not present

## 2020-06-16 DIAGNOSIS — R1084 Generalized abdominal pain: Secondary | ICD-10-CM | POA: Diagnosis not present

## 2020-06-19 ENCOUNTER — Encounter: Payer: Self-pay | Admitting: Rehabilitative and Restorative Service Providers"

## 2020-06-19 ENCOUNTER — Other Ambulatory Visit: Payer: Self-pay

## 2020-06-19 ENCOUNTER — Ambulatory Visit: Payer: Medicare Other | Admitting: Rehabilitative and Restorative Service Providers"

## 2020-06-19 DIAGNOSIS — M62830 Muscle spasm of back: Secondary | ICD-10-CM | POA: Diagnosis not present

## 2020-06-19 DIAGNOSIS — M5416 Radiculopathy, lumbar region: Secondary | ICD-10-CM

## 2020-06-19 DIAGNOSIS — M542 Cervicalgia: Secondary | ICD-10-CM

## 2020-06-19 DIAGNOSIS — M545 Low back pain, unspecified: Secondary | ICD-10-CM

## 2020-06-19 DIAGNOSIS — R262 Difficulty in walking, not elsewhere classified: Secondary | ICD-10-CM | POA: Diagnosis not present

## 2020-06-19 DIAGNOSIS — M546 Pain in thoracic spine: Secondary | ICD-10-CM

## 2020-06-19 DIAGNOSIS — M6281 Muscle weakness (generalized): Secondary | ICD-10-CM | POA: Diagnosis not present

## 2020-06-19 DIAGNOSIS — G8929 Other chronic pain: Secondary | ICD-10-CM
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
## 2020-06-25 ENCOUNTER — Other Ambulatory Visit: Payer: Self-pay

## 2020-06-25 ENCOUNTER — Ambulatory Visit (INDEPENDENT_AMBULATORY_CARE_PROVIDER_SITE_OTHER): Payer: Medicare Other | Admitting: Student

## 2020-06-25 ENCOUNTER — Encounter: Payer: Self-pay | Admitting: Student

## 2020-06-25 VITALS — BP 126/97 | HR 84 | Temp 97.6°F | Ht 65.0 in | Wt 163.7 lb

## 2020-06-25 DIAGNOSIS — G959 Disease of spinal cord, unspecified: Secondary | ICD-10-CM

## 2020-06-25 DIAGNOSIS — F32A Depression, unspecified: Secondary | ICD-10-CM | POA: Diagnosis not present

## 2020-06-25 DIAGNOSIS — M797 Fibromyalgia: Secondary | ICD-10-CM

## 2020-06-25 DIAGNOSIS — M25532 Pain in left wrist: Secondary | ICD-10-CM

## 2020-06-25 DIAGNOSIS — M5412 Radiculopathy, cervical region: Secondary | ICD-10-CM

## 2020-06-25 DIAGNOSIS — G40919 Epilepsy, unspecified, intractable, without status epilepticus: Secondary | ICD-10-CM

## 2020-06-25 DIAGNOSIS — G5603 Carpal tunnel syndrome, bilateral upper limbs: Secondary | ICD-10-CM

## 2020-06-25 DIAGNOSIS — M25531 Pain in right wrist: Secondary | ICD-10-CM

## 2020-06-25 DIAGNOSIS — M5416 Radiculopathy, lumbar region: Secondary | ICD-10-CM

## 2020-06-25 NOTE — Assessment & Plan Note (Signed)
Patient states she continues to have pain all over.  She was referred to Dr. Archie Endo office and they referred her to a spine specialist who then referred her to a pain specialist. Patient states Dr. Primus Bravo, pain specialist, plans to start her on a narcotic treatment due to her chronic pain affecting her functional status.  However, patient has to be seizure-free for 2 months before he can start pain management. Patient states Dr. Berenice Primas discontinue her Flexeril and tramadol --Continue Effexor 75 mg daily --Continue gabapentin 600 mg daily --Follow-up with Dr. Primus Bravo for pain management --Continue PT --Start OT

## 2020-06-25 NOTE — Assessment & Plan Note (Addendum)
Patient has been dealing with this problem for a year now.  The pain is worse in her right hand and wrist. States she has difficulty grasping items and doing housework such as cleaning or cooking due to the pain in her hands wrist.  She has used her brace in the past which has not helped with her symptoms. She was referred to orthopedic for carpal tunnel release surgery. However, due to her recent frequent seizures, they want to wait until that is addressed before doing surgery. She has been receiving steroid injections in her wrist to help control her symptoms. --Continue Voltaren gel --Continue gabapentin --Tylenol as needed for pain --Follow-up with orthopedic surgery, Dr. Archie Endo office --Follow-up with pain specialist --Referred to occupational therapy

## 2020-06-25 NOTE — Assessment & Plan Note (Signed)
Patient with a history of intractable epilepsy managed with vagus nerve stimulator (VNS) since 2003 presents to clinic after seeing her neurosurgeon.  Patient reports she was only having seizures once every 3 to 4 months until the last few weeks when she has had more frequent seizures.  She saw Dr. Tivis Ringer, her neurosurgeon, on 06/15/2020 and interrogation of the device showed that the generator had malfunction. Patient states when they turn on the device, she experienced seizure episode while in the neurosurgery clinic. States since that visit, she has had 2 seizure episodes where she was aware and likely a few more where she was not aware. Patient can often tell when her seizure is coming on because she started stuttering and she goes blank for a few seconds. States her neurosurgeon plans to schedule her for surgery for VNS battery replacement or possible lead revision. --Continue Topamax 20 mg twice daily --Follow-up with neurosurgery for VNS management  --Follow-up in 3 months

## 2020-06-25 NOTE — Progress Notes (Signed)
CC: Follow-up  HPI:  [name] is a 55 y.o. woman with past medical history as below here for follow-up on her chronic medical problems after referral to multiple specialists at last office visit. Please see problem based charting for evaluation, assessment and plan.  Past Medical History:  Diagnosis Date  . Anxiety   . Asthma   . Biliary dyskinesia    a. s/p cholecystectomy.  Marland Kitchen BMI 40.0-44.9, adult ([place]) 06/11/2015  . 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 Abnl stress [place] 2008, f/u cath reportedly nl. b. ETT-Myoview 04/2011 - EKG changes but normal perfusion. Cor CT - no coronary calcium, no definite stenosis though mRCA not fully evaluated. c. 10/2011 - tn elevated in [place], LHC without CAD. Started on Ranexa, anti-anginals ?microvascular dz but later stopped while in hospital on abx.  . Complication of anesthesia    hard to wake up-had to be reminded to breath  . Diarrhea 08/09/2017  . GERD (gastroesophageal reflux disease)    a. Severe.  Marland Kitchen History of seizure 10/19/2017  . HTN (hypertension)   . Hx of cardiovascular stress test    Lex Myoview 8/14:  Normal, EF 74%  . Hx of echocardiogram    Echo 3/16:  Mild LVH, EF 55-60%, Gr 1 DD, trivial MR, mild LAE, normal RVF  . Hydradenitis 11/09/2016  . Hypothyroid   . Hypothyroidism 07/02/2013   Overview:  Last Assessment & Plan:   She reports a 21 pound increase in her weight since October. She doesn't know when the last time her TSH was checked so we will do that today.  . Low back pain 04/19/2016  . MI (myocardial infarction) ([place])   . Migraine   . Morbid obesity ([place]) 07/26/2017  . MRSA infection    a. After vagal nerve stimulator at [facility] - surgical site MRSA infection, PICC placed.  . Obesity   . OSA (obstructive sleep apnea) 07/26/2017   uses CPAP nightly  . Palpitations    a. 08/2014: 48 hour holter with 2 PVCs otherwise normal.  . Pre-diabetes   . Rectal pain 12/19/2017  . Seizure disorder  ([place])    a. since childhood. b. s/p vagal nerve stimulator at [facility].  . Seizures ([place])     Review of Systems:  Constitutional: Negative for fever or fatigue Eyes: Negative for visual changes Respiratory: Negative for shortness of breath Cardiac: Negative for chest pain MSK: Positive for back pain, neck pain, wrist pain and knee pain Neuro: Positive for migraines, tingling, numbness and seizures. Negative for weakness Psych: Positive for depression and anxiety. Negative for SI  Physical Exam: General: Pleasant middle-aged woman. No acute distress. Neck: Mild tenderness to palpation of the C-spine.  Normal ROM. Cardiac: RRR. No murmurs, rubs or gallops.  Respiratory: Lungs CTAB. No wheezing or crackles. Abdominal: Soft, symmetric and non tender. Normal BS. MSK: TTp of the L-spine and paraspinal muscles. Mild TTP of bilateral knee and wrist. No effusion.  Normal range of motion Skin: Warm, dry and intact without rashes or lesions Neuro: A&O x 3. Moves all extremities. Normal sensation.  Ambulates with a walker. Psych: Depressed mood.  Appropriate affect  Vitals:   06/25/20 1006  BP: (!) 126/97  Pulse: 84  Temp: 97.6 F (36.4 C)  TempSrc: Oral  SpO2: 100%  Weight: 163 lb 11.2 oz (74.3 kg)  Height: 5\' 5"  (1.651 m)     Assessment & Plan:   See Encounters Tab for  problem based charting.  Patient discussed with Dr. Emelia Salisbury, MD, MPH

## 2020-06-25 NOTE — Assessment & Plan Note (Signed)
Patient's PHQ-9 is 19 today. Patient states constantly being in pain and not being as independent as she was before has worsened her depression. States she wants to be here for 57 year old daughter but has had SI in the past.  She has no active SI but states she wants the pain to get better so she can get back to doing her daily activities.  She was referred to behavioral health for counseling but never received a phone call from them. Patient states she thinks counseling would help her manage her depression in addition to the medications. --Continue trazodone 100 mg daily --Continue Prozac 20 mg daily --Continue Effexor-XR 75 mg capsule daily --Referred to integrated behavioral health, Dr. Theodis Shove --Patient at risk of serotonin syndrome while on SSRIs and SNRIs. Consider discontinuing one of these medications if seizure is not controlled after VNS is managed by neurosurgery.

## 2020-06-25 NOTE — Patient Instructions (Signed)
Thank you, Ms.[name] for allowing [place] to provide your care today. Today we discussed your chronic pain, depression and epilepsy.  Please follow-up with a specialist and continue their treatments/surgery.  Make sure to follow-up with [place] after your surgeries.  I have place a referrals to occupational therapy and behavioral health  Please follow-up in 3 months  Should you have any questions or concerns please call the internal medicine clinic at [phone].    Linwood Dibbles, MD, MPH [facility]   My Chart Access: [email or web address]   If you have not already done so, please get your COVID 19 vaccine  To schedule an appointment for a COVID vaccine choice any of the following: Go to [email or web address]   Go to [email or web address]                  Call [phone]                                     Call [phone] and select Option 2
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
## 2020-06-26 DIAGNOSIS — R058 Other specified cough: Secondary | ICD-10-CM | POA: Diagnosis not present

## 2020-06-26 DIAGNOSIS — Z20822 Contact with and (suspected) exposure to covid-19: Secondary | ICD-10-CM | POA: Diagnosis not present

## 2020-06-29 ENCOUNTER — Ambulatory Visit: Payer: Medicare Other | Admitting: Physical Therapy

## 2020-06-29 NOTE — Progress Notes (Signed)
Internal Medicine Clinic Attending  Case discussed with Dr. Amponsah  At the time of the visit.  We reviewed the resident's history and exam and pertinent patient test results.  I agree with the assessment, diagnosis, and plan of care documented in the resident's note.  

## 2020-06-30 ENCOUNTER — Encounter: Payer: Self-pay | Admitting: Student

## 2020-06-30 ENCOUNTER — Other Ambulatory Visit: Payer: Self-pay | Admitting: Student

## 2020-07-01 ENCOUNTER — Ambulatory Visit: Payer: Medicare Other | Admitting: Physical Therapy

## 2020-07-01 DIAGNOSIS — F411 Generalized anxiety disorder: Secondary | ICD-10-CM | POA: Diagnosis not present

## 2020-07-01 DIAGNOSIS — F4310 Post-traumatic stress disorder, unspecified: Secondary | ICD-10-CM | POA: Diagnosis not present

## 2020-07-01 DIAGNOSIS — F331 Major depressive disorder, recurrent, moderate: Secondary | ICD-10-CM | POA: Diagnosis not present

## 2020-07-06 ENCOUNTER — Other Ambulatory Visit: Payer: Self-pay | Admitting: Cardiology

## 2020-07-06 ENCOUNTER — Ambulatory Visit
Payer: Medicare Other | Attending: Student in an Organized Health Care Education/Training Program | Admitting: Physical Therapy

## 2020-07-06 ENCOUNTER — Telehealth: Payer: Self-pay | Admitting: Physical Therapy

## 2020-07-06 DIAGNOSIS — M546 Pain in thoracic spine: Secondary | ICD-10-CM | POA: Insufficient documentation

## 2020-07-06 DIAGNOSIS — G8929 Other chronic pain: Secondary | ICD-10-CM | POA: Insufficient documentation

## 2020-07-06 DIAGNOSIS — R262 Difficulty in walking, not elsewhere classified: Secondary | ICD-10-CM | POA: Insufficient documentation

## 2020-07-06 DIAGNOSIS — Z01818 Encounter for other preprocedural examination: Secondary | ICD-10-CM

## 2020-07-06 DIAGNOSIS — M542 Cervicalgia: Secondary | ICD-10-CM | POA: Insufficient documentation

## 2020-07-06 DIAGNOSIS — M6281 Muscle weakness (generalized): Secondary | ICD-10-CM | POA: Insufficient documentation

## 2020-07-06 DIAGNOSIS — M5416 Radiculopathy, lumbar region: Secondary | ICD-10-CM | POA: Insufficient documentation

## 2020-07-06 DIAGNOSIS — M62830 Muscle spasm of back: Secondary | ICD-10-CM | POA: Insufficient documentation

## 2020-07-06 DIAGNOSIS — M545 Low back pain, unspecified: Secondary | ICD-10-CM | POA: Insufficient documentation

## 2020-07-06 NOTE — Telephone Encounter (Signed)
Patient contacted regarding missed visit. Patient not available. Therapy left voicemail. As this is her 4th no-show of this episode all further treatments will be canceled. She was offered to schedule 1 visit for discharge follow up. She was advised to call back if she would like to schedule 1 more visit.

## 2020-07-08 ENCOUNTER — Ambulatory Visit: Payer: Medicare Other | Admitting: Physical Therapy

## 2020-07-15 ENCOUNTER — Telehealth: Payer: Self-pay | Admitting: Behavioral Health

## 2020-07-15 ENCOUNTER — Other Ambulatory Visit: Payer: Self-pay

## 2020-07-15 ENCOUNTER — Institutional Professional Consult (permissible substitution): Payer: Medicare Other | Admitting: Behavioral Health

## 2020-07-15 NOTE — Telephone Encounter (Signed)
Spoke w/Pt for approximately 15 min to Introduce myself & plan to schedule Pt in the near future. Pt at Dentist today & unable to speak in private. Pt agreed to plan & thanked Clinician for calling.  Next appt: [place] 07/29/20 @ 10:00am due to autistic Dtr needing Mom in the afternoon.   Dr. Theodis Shove

## 2020-07-21 DIAGNOSIS — N3946 Mixed incontinence: Secondary | ICD-10-CM | POA: Diagnosis not present

## 2020-07-21 DIAGNOSIS — R350 Frequency of micturition: Secondary | ICD-10-CM | POA: Diagnosis not present

## 2020-07-28 ENCOUNTER — Ambulatory Visit: Payer: Medicare Other | Admitting: Physical Therapy

## 2020-07-28 ENCOUNTER — Other Ambulatory Visit: Payer: Self-pay | Admitting: Student

## 2020-07-28 ENCOUNTER — Other Ambulatory Visit: Payer: Self-pay

## 2020-07-28 DIAGNOSIS — M546 Pain in thoracic spine: Secondary | ICD-10-CM | POA: Diagnosis not present

## 2020-07-28 DIAGNOSIS — M6281 Muscle weakness (generalized): Secondary | ICD-10-CM

## 2020-07-28 DIAGNOSIS — G8929 Other chronic pain: Secondary | ICD-10-CM | POA: Diagnosis not present

## 2020-07-28 DIAGNOSIS — M62830 Muscle spasm of back: Secondary | ICD-10-CM | POA: Diagnosis not present

## 2020-07-28 DIAGNOSIS — M5416 Radiculopathy, lumbar region: Secondary | ICD-10-CM

## 2020-07-28 DIAGNOSIS — R262 Difficulty in walking, not elsewhere classified: Secondary | ICD-10-CM

## 2020-07-28 DIAGNOSIS — M542 Cervicalgia: Secondary | ICD-10-CM | POA: Diagnosis not present

## 2020-07-28 DIAGNOSIS — M545 Low back pain, unspecified: Secondary | ICD-10-CM

## 2020-07-29 ENCOUNTER — Ambulatory Visit: Payer: Medicare Other | Admitting: Behavioral Health

## 2020-07-29 DIAGNOSIS — F418 Other specified anxiety disorders: Secondary | ICD-10-CM
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
## 2020-07-30 DIAGNOSIS — G4733 Obstructive sleep apnea (adult) (pediatric): Secondary | ICD-10-CM | POA: Diagnosis not present

## 2020-08-02 DIAGNOSIS — M25562 Pain in left knee: Secondary | ICD-10-CM | POA: Diagnosis not present

## 2020-08-02 DIAGNOSIS — M25561 Pain in right knee: Secondary | ICD-10-CM | POA: Diagnosis not present

## 2020-08-02 DIAGNOSIS — M170 Bilateral primary osteoarthritis of knee: Secondary | ICD-10-CM | POA: Diagnosis not present

## 2020-08-03 ENCOUNTER — Encounter: Payer: Self-pay | Admitting: Occupational Therapy

## 2020-08-03 ENCOUNTER — Other Ambulatory Visit: Payer: Self-pay

## 2020-08-03 ENCOUNTER — Ambulatory Visit: Payer: Medicare Other | Attending: Orthopedic Surgery | Admitting: Occupational Therapy

## 2020-08-03 DIAGNOSIS — G8929 Other chronic pain: Secondary | ICD-10-CM | POA: Diagnosis not present

## 2020-08-03 DIAGNOSIS — R262 Difficulty in walking, not elsewhere classified: Secondary | ICD-10-CM | POA: Diagnosis not present

## 2020-08-03 DIAGNOSIS — R208 Other disturbances of skin sensation: Secondary | ICD-10-CM

## 2020-08-03 DIAGNOSIS — M542 Cervicalgia: Secondary | ICD-10-CM | POA: Insufficient documentation

## 2020-08-03 DIAGNOSIS — M79602 Pain in left arm: Secondary | ICD-10-CM | POA: Insufficient documentation

## 2020-08-03 DIAGNOSIS — M79601 Pain in right arm: Secondary | ICD-10-CM | POA: Insufficient documentation

## 2020-08-03 DIAGNOSIS — M545 Low back pain, unspecified: Secondary | ICD-10-CM | POA: Diagnosis not present

## 2020-08-03 DIAGNOSIS — M6281 Muscle weakness (generalized): Secondary | ICD-10-CM | POA: Diagnosis not present

## 2020-08-03 DIAGNOSIS — M5416 Radiculopathy, lumbar region: Secondary | ICD-10-CM | POA: Insufficient documentation

## 2020-08-03 DIAGNOSIS — M546 Pain in thoracic spine: Secondary | ICD-10-CM | POA: Insufficient documentation

## 2020-08-03 DIAGNOSIS — M62830 Muscle spasm of back: Secondary | ICD-10-CM | POA: Diagnosis not present
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
## 2020-08-06 ENCOUNTER — Other Ambulatory Visit: Payer: Self-pay

## 2020-08-06 ENCOUNTER — Ambulatory Visit: Payer: Medicare Other | Admitting: Physical Therapy

## 2020-08-06 ENCOUNTER — Encounter: Payer: Self-pay | Admitting: Physical Therapy

## 2020-08-06 DIAGNOSIS — M62830 Muscle spasm of back: Secondary | ICD-10-CM

## 2020-08-06 DIAGNOSIS — M79602 Pain in left arm: Secondary | ICD-10-CM | POA: Diagnosis not present

## 2020-08-06 DIAGNOSIS — R208 Other disturbances of skin sensation: Secondary | ICD-10-CM

## 2020-08-06 DIAGNOSIS — M6281 Muscle weakness (generalized): Secondary | ICD-10-CM | POA: Diagnosis not present

## 2020-08-06 DIAGNOSIS — R262 Difficulty in walking, not elsewhere classified: Secondary | ICD-10-CM | POA: Diagnosis not present

## 2020-08-06 DIAGNOSIS — M546 Pain in thoracic spine: Secondary | ICD-10-CM

## 2020-08-06 DIAGNOSIS — M542 Cervicalgia: Secondary | ICD-10-CM | POA: Diagnosis not present

## 2020-08-06 DIAGNOSIS — G8929 Other chronic pain: Secondary | ICD-10-CM | POA: Diagnosis not present

## 2020-08-06 DIAGNOSIS — M5416 Radiculopathy, lumbar region: Secondary | ICD-10-CM

## 2020-08-06 DIAGNOSIS — M545 Low back pain, unspecified: Secondary | ICD-10-CM | POA: Diagnosis not present

## 2020-08-06 DIAGNOSIS — M79601 Pain in right arm: Secondary | ICD-10-CM
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
## 2020-08-09 DIAGNOSIS — Z9689 Presence of other specified functional implants: Secondary | ICD-10-CM | POA: Diagnosis not present

## 2020-08-09 DIAGNOSIS — Z4542 Encounter for adjustment and management of neuropacemaker (brain) (peripheral nerve) (spinal cord): Secondary | ICD-10-CM | POA: Diagnosis not present

## 2020-08-09 DIAGNOSIS — Z9682 Presence of neurostimulator: Secondary | ICD-10-CM | POA: Diagnosis not present

## 2020-08-09 DIAGNOSIS — G40919 Epilepsy, unspecified, intractable, without status epilepticus: Secondary | ICD-10-CM | POA: Diagnosis not present

## 2020-08-10 ENCOUNTER — Ambulatory Visit: Payer: Medicare Other | Admitting: Occupational Therapy

## 2020-08-10 ENCOUNTER — Other Ambulatory Visit: Payer: Self-pay

## 2020-08-10 ENCOUNTER — Encounter: Payer: Self-pay | Admitting: Occupational Therapy

## 2020-08-10 DIAGNOSIS — M79601 Pain in right arm: Secondary | ICD-10-CM

## 2020-08-10 DIAGNOSIS — M62830 Muscle spasm of back: Secondary | ICD-10-CM | POA: Diagnosis not present

## 2020-08-10 DIAGNOSIS — M545 Low back pain, unspecified: Secondary | ICD-10-CM | POA: Diagnosis not present

## 2020-08-10 DIAGNOSIS — M542 Cervicalgia: Secondary | ICD-10-CM | POA: Diagnosis not present

## 2020-08-10 DIAGNOSIS — M546 Pain in thoracic spine: Secondary | ICD-10-CM | POA: Diagnosis not present

## 2020-08-10 DIAGNOSIS — R208 Other disturbances of skin sensation: Secondary | ICD-10-CM

## 2020-08-10 DIAGNOSIS — M79602 Pain in left arm: Secondary | ICD-10-CM

## 2020-08-10 DIAGNOSIS — M5416 Radiculopathy, lumbar region: Secondary | ICD-10-CM | POA: Diagnosis not present

## 2020-08-10 DIAGNOSIS — M6281 Muscle weakness (generalized): Secondary | ICD-10-CM | POA: Diagnosis not present

## 2020-08-10 DIAGNOSIS — R262 Difficulty in walking, not elsewhere classified: Secondary | ICD-10-CM | POA: Diagnosis not present

## 2020-08-10 DIAGNOSIS — G8929 Other chronic pain: Secondary | ICD-10-CM | POA: Diagnosis not present

## 2020-08-10 NOTE — Patient Instructions (Addendum)
Flexor Tendon Gliding (Active Hook Fist)   With fingers and knuckles straight, bend middle and tip joints. Do not bend large knuckles. Repeat _10-15___ times. Do _4-6___ sessions per day.  MP Flexion (Active)   With back of hand on table, bend large knuckles as far as they will go, keeping small joints straight. Repeat _10-15___ times. Do __4-6__ sessions per day. Activity: Reach into a narrow container.*      Finger Flexion / Extension   With palm up, bend fingers of left hand toward palm, making a  fist. Straighten fingers, opening fist. Repeat sequence _10-15___ times per session. Do _4-6__ sessions per day. Hand Variation: Palm down   Copyright  VHI. All rights reserved.  AROM: Wrist Extension   .  With ____ palm down, bend wrist up. Repeat __15__ times per set.  Do __4-6__ sessions per day.    AROM: Wrist Flexion   With_____ palm up, bend wrist up. Repeat __15__ times per set.  Do _4-6___ sessions per day.   AROM: Forearm Pronation / Supination   With ____ arm in handshake position, slowly rotate palm down until stretch is felt. Relax. Then rotate palm up until stretch is felt. Repeat _15___ times per set. Do _4-6___ sessions per day.  Copyright  VHI. All rights reserved.
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
## 2020-08-12 ENCOUNTER — Ambulatory Visit: Payer: Medicare Other | Admitting: Physical Therapy

## 2020-08-12 ENCOUNTER — Encounter: Payer: Self-pay | Admitting: Physical Therapy

## 2020-08-12 ENCOUNTER — Other Ambulatory Visit: Payer: Self-pay

## 2020-08-12 DIAGNOSIS — M545 Low back pain, unspecified: Secondary | ICD-10-CM | POA: Diagnosis not present

## 2020-08-12 DIAGNOSIS — M5416 Radiculopathy, lumbar region: Secondary | ICD-10-CM

## 2020-08-12 DIAGNOSIS — M542 Cervicalgia: Secondary | ICD-10-CM | POA: Diagnosis not present

## 2020-08-12 DIAGNOSIS — M79601 Pain in right arm: Secondary | ICD-10-CM

## 2020-08-12 DIAGNOSIS — R262 Difficulty in walking, not elsewhere classified: Secondary | ICD-10-CM

## 2020-08-12 DIAGNOSIS — M546 Pain in thoracic spine: Secondary | ICD-10-CM

## 2020-08-12 DIAGNOSIS — M62830 Muscle spasm of back: Secondary | ICD-10-CM

## 2020-08-12 DIAGNOSIS — M79602 Pain in left arm: Secondary | ICD-10-CM

## 2020-08-12 DIAGNOSIS — G8929 Other chronic pain: Secondary | ICD-10-CM | POA: Diagnosis not present

## 2020-08-12 DIAGNOSIS — R208 Other disturbances of skin sensation: Secondary | ICD-10-CM

## 2020-08-12 DIAGNOSIS — M6281 Muscle weakness (generalized): Secondary | ICD-10-CM | POA: Diagnosis not present
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
## 2020-08-12 NOTE — Patient Instructions (Signed)
Over Head Pull: Narrow Grip       On back, knees bent, feet flat, band across thighs, elbows straight but relaxed. Pull hands apart (start). Keeping elbows straight, bring arms up and over head, hands toward floor. Keep pull steady on band. Hold momentarily. Return slowly, keeping pull steady, back to start. Repeat 10___ times. Band color __red____   Side Pull: Double Arm   On back, knees bent, feet flat. Arms perpendicular to body, shoulder level, elbows straight but relaxed. Pull arms out to sides, elbows straight. Resistance band comes across collarbones, hands toward floor. Hold momentarily. Slowly return to starting position. Repeat 10___ times. Band color __red___   Elmer Picker   On back, knees bent, feet flat, left hand on left hip, right hand above left. Pull right arm DIAGONALLY (hip to shoulder) across chest. Bring right arm along head toward floor. Hold momentarily. Slowly return to starting position. Repeat _10__ times. Do with left arm. Band color _red_____   Shoulder Rotation: Double Arm   On back, knees bent, feet flat, elbows tucked at sides, bent 90, hands palms up. Pull hands apart and down toward floor, keeping elbows near sides. Hold momentarily. Slowly return to starting position. Repeat _10__ times. Band color __red____     Voncille Lo, PT, [place] Certified Exercise Expert for the Aging Adult  08/12/20 9:56 AM Phone: [phone] Fax: [phone]

## 2020-08-13 ENCOUNTER — Ambulatory Visit: Payer: Medicare Other | Admitting: Occupational Therapy

## 2020-08-13 ENCOUNTER — Encounter: Payer: Self-pay | Admitting: Occupational Therapy

## 2020-08-13 DIAGNOSIS — M79601 Pain in right arm: Secondary | ICD-10-CM | POA: Diagnosis not present

## 2020-08-13 DIAGNOSIS — M62830 Muscle spasm of back: Secondary | ICD-10-CM | POA: Diagnosis not present

## 2020-08-13 DIAGNOSIS — M79602 Pain in left arm: Secondary | ICD-10-CM

## 2020-08-13 DIAGNOSIS — R262 Difficulty in walking, not elsewhere classified: Secondary | ICD-10-CM | POA: Diagnosis not present

## 2020-08-13 DIAGNOSIS — M542 Cervicalgia: Secondary | ICD-10-CM | POA: Diagnosis not present

## 2020-08-13 DIAGNOSIS — G8929 Other chronic pain: Secondary | ICD-10-CM | POA: Diagnosis not present

## 2020-08-13 DIAGNOSIS — R208 Other disturbances of skin sensation: Secondary | ICD-10-CM

## 2020-08-13 DIAGNOSIS — M6281 Muscle weakness (generalized): Secondary | ICD-10-CM

## 2020-08-13 DIAGNOSIS — M545 Low back pain, unspecified: Secondary | ICD-10-CM | POA: Diagnosis not present

## 2020-08-13 DIAGNOSIS — M5416 Radiculopathy, lumbar region: Secondary | ICD-10-CM | POA: Diagnosis not present

## 2020-08-13 DIAGNOSIS — M546 Pain in thoracic spine: Secondary | ICD-10-CM | POA: Diagnosis not present

## 2020-08-13 NOTE — Patient Instructions (Signed)
1. Grip Strengthening (Resistive Putty)   Squeeze putty using thumb and all fingers. Repeat _20___ times. Do __2__ sessions per day.   2. Roll putty into tube on table and pinch between each finger and thumb x 10 reps each. (can do ring and small finger together)     Copyright  VHI. All rights reserved.   
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
## 2020-08-16 ENCOUNTER — Ambulatory Visit: Payer: Medicare Other | Admitting: Physical Therapy

## 2020-08-18 ENCOUNTER — Encounter: Payer: Self-pay | Admitting: Physical Therapy

## 2020-08-18 ENCOUNTER — Ambulatory Visit: Payer: Medicare Other | Admitting: Physical Therapy

## 2020-08-18 ENCOUNTER — Other Ambulatory Visit: Payer: Self-pay

## 2020-08-18 DIAGNOSIS — M6281 Muscle weakness (generalized): Secondary | ICD-10-CM

## 2020-08-18 DIAGNOSIS — M545 Low back pain, unspecified: Secondary | ICD-10-CM | POA: Diagnosis not present

## 2020-08-18 DIAGNOSIS — M79601 Pain in right arm: Secondary | ICD-10-CM | POA: Diagnosis not present

## 2020-08-18 DIAGNOSIS — M542 Cervicalgia: Secondary | ICD-10-CM

## 2020-08-18 DIAGNOSIS — M546 Pain in thoracic spine: Secondary | ICD-10-CM | POA: Diagnosis not present

## 2020-08-18 DIAGNOSIS — M5416 Radiculopathy, lumbar region: Secondary | ICD-10-CM | POA: Diagnosis not present

## 2020-08-18 DIAGNOSIS — M79602 Pain in left arm: Secondary | ICD-10-CM | POA: Diagnosis not present

## 2020-08-18 DIAGNOSIS — M62830 Muscle spasm of back: Secondary | ICD-10-CM

## 2020-08-18 DIAGNOSIS — G8929 Other chronic pain: Secondary | ICD-10-CM

## 2020-08-18 DIAGNOSIS — R208 Other disturbances of skin sensation: Secondary | ICD-10-CM | POA: Diagnosis not present

## 2020-08-18 DIAGNOSIS — R262 Difficulty in walking, not elsewhere classified: Secondary | ICD-10-CM
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
## 2020-08-19 ENCOUNTER — Ambulatory Visit: Payer: Medicare Other | Admitting: Behavioral Health

## 2020-08-19 ENCOUNTER — Ambulatory Visit: Payer: Medicare Other | Admitting: Occupational Therapy

## 2020-08-19 DIAGNOSIS — F418 Other specified anxiety disorders: Secondary | ICD-10-CM
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
## 2020-08-20 ENCOUNTER — Other Ambulatory Visit: Payer: Self-pay

## 2020-08-20 ENCOUNTER — Encounter: Payer: Self-pay | Admitting: Occupational Therapy

## 2020-08-20 ENCOUNTER — Ambulatory Visit: Payer: Medicare Other | Admitting: Occupational Therapy

## 2020-08-20 DIAGNOSIS — M62830 Muscle spasm of back: Secondary | ICD-10-CM | POA: Diagnosis not present

## 2020-08-20 DIAGNOSIS — M79601 Pain in right arm: Secondary | ICD-10-CM | POA: Diagnosis not present

## 2020-08-20 DIAGNOSIS — M5416 Radiculopathy, lumbar region: Secondary | ICD-10-CM | POA: Diagnosis not present

## 2020-08-20 DIAGNOSIS — R262 Difficulty in walking, not elsewhere classified: Secondary | ICD-10-CM | POA: Diagnosis not present

## 2020-08-20 DIAGNOSIS — M546 Pain in thoracic spine: Secondary | ICD-10-CM | POA: Diagnosis not present

## 2020-08-20 DIAGNOSIS — G8929 Other chronic pain: Secondary | ICD-10-CM | POA: Diagnosis not present

## 2020-08-20 DIAGNOSIS — R208 Other disturbances of skin sensation: Secondary | ICD-10-CM

## 2020-08-20 DIAGNOSIS — M542 Cervicalgia: Secondary | ICD-10-CM | POA: Diagnosis not present

## 2020-08-20 DIAGNOSIS — M545 Low back pain, unspecified: Secondary | ICD-10-CM | POA: Diagnosis not present

## 2020-08-20 DIAGNOSIS — M6281 Muscle weakness (generalized): Secondary | ICD-10-CM | POA: Diagnosis not present

## 2020-08-20 DIAGNOSIS — M79602 Pain in left arm: Secondary | ICD-10-CM | POA: Diagnosis not present
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
## 2020-08-21 NOTE — Progress Notes (Signed)
Cardiology Office Note:    Date:  08/23/2020   ID:  [name], DOB July 17, 1965, MRN [id number]  PCP:  Cato Mulligan, MD   [facility]  Cardiologist:  Ena Dawley, MD  Advanced Practice Provider:  No care team member to display Electrophysiologist:  None   Referring MD: Cato Mulligan, MD    History of Present Illness:    [name] is a 55 y.o. female with a hx of anxiety, chronic chest pain, seizures, morbid obesity, and GERD who was previously followed by Dr. Meda Coffee now presenting for follow-up and pre-operative evaluation.  Patient was previously followed by Dr. [name] and Dr. Meda Coffee and has been extensively evaluated for chest pain. Specifically, had abnormal stress test in 2008 that was followed by normal cardiac catheterization. Coronary CT angiogram (04/2011): calcium score 0, difficult study with gating artifact, mid RCA nonevaluatable but no stenosis elsewhere in the coronaries. Echo (11/12): EF 55-60%, mild LV hypertrophy, mild MR. NSTEMI in [place] in 5/13 with elevated troponin but LHC showed normal coronaries.   Last saw Dr. Meda Coffee on 10/08/19 where she was doing very well. She underwent bariatric surgery and lost 140lbs.She was focusing on healthy diet and exercise but noted to labile blood pressures at that time.  Patient states that she had COVID in 06/2020 and has been having chest discomfort and fatigue that has been ongoing since that time. Symptoms are worsened with exertion and she finds that her energy levels just are not back to where they used to be. Also notes some increased swelling in her legs and feet. No orthopnea, PND, palpitations, lightheadedness or dizziness. Blood pressure has been elevated but she has stopped her lisinopril as she was not sure she was supposed to take it.   Notably, patient is planned for replacement of vagus nerve stimulator removal and re-implantation and needs cardiac clearance.   Past Medical  History:  Diagnosis Date  . Anxiety   . Asthma   . Biliary dyskinesia    a. s/p cholecystectomy.  Marland Kitchen BMI 40.0-44.9, adult ([place]) 06/11/2015  . 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 Abnl stress [place] 2008, f/u cath reportedly nl. b. ETT-Myoview 04/2011 - EKG changes but normal perfusion. Cor CT - no coronary calcium, no definite stenosis though mRCA not fully evaluated. c. 10/2011 - tn elevated in [place], LHC without CAD. Started on Ranexa, anti-anginals ?microvascular dz but later stopped while in hospital on abx.  . Complication of anesthesia    hard to wake up-had to be reminded to breath  . Diarrhea 08/09/2017  . GERD (gastroesophageal reflux disease)    a. Severe.  Marland Kitchen History of seizure 10/19/2017  . HTN (hypertension)   . Hx of cardiovascular stress test    Lex Myoview 8/14:  Normal, EF 74%  . Hx of echocardiogram    Echo 3/16:  Mild LVH, EF 55-60%, Gr 1 DD, trivial MR, mild LAE, normal RVF  . Hydradenitis 11/09/2016  . Hypothyroid   . Hypothyroidism 07/02/2013   Overview:  Last Assessment & Plan:   She reports a 21 pound increase in her weight since October. She doesn't know when the last time her TSH was checked so we will do that today.  . Low back pain 04/19/2016  . MI (myocardial infarction) ([place])   . Migraine   . Morbid obesity ([place]) 07/26/2017  . MRSA infection    a. After vagal nerve stimulator at [facility] - surgical  site MRSA infection, PICC placed.  . Obesity   . OSA (obstructive sleep apnea) 07/26/2017   uses CPAP nightly  . Palpitations    a. 08/2014: 48 hour holter with 2 PVCs otherwise normal.  . Pre-diabetes   . Rectal pain 12/19/2017  . Seizure disorder ([place])    a. since childhood. b. s/p vagal nerve stimulator at [facility].  . Seizures ([place])     Past Surgical History:  Procedure Laterality Date  . [place] N/A 06/01/2016   Procedure: Right Heart Cath;  Surgeon: Larey Dresser, MD;  Location: [place] [facility];  Service: Cardiovascular;  Laterality: N/A;  . CESAREAN SECTION     placement of vagal nerve stimulator.  . CHOLECYSTECTOMY N/A 01/24/2013   Procedure: LAPAROSCOPIC CHOLECYSTECTOMY;  Surgeon: Harl Bowie, MD;  Location: [place];  Service: General;  Laterality: N/A;  . GASTRIC BYPASS  2019  . IMPLANTATION VAGAL NERVE STIMULATOR  2000,2013   battery chg-[facility]    Current Medications: Current Meds  Medication Sig  . acetaminophen (TYLENOL) 325 MG tablet Take 650 mg by mouth every 6 (six) hours as needed for headache.   . albuterol (PROVENTIL HFA;VENTOLIN HFA) 108 (90 Base) MCG/ACT inhaler Inhale 2 puffs into the lungs every 6 (six) hours as needed for wheezing or shortness of breath.  . butalbital-acetaminophen-caffeine (FIORICET) 50-325-40 MG tablet Take one tablet daily if needed for acute migraine. #10 per 30 days. No early refills.  . clindamycin (CLINDACIN-P) 1 % SWAB Apply 1 application topically 2 (two) times daily.  . diclofenac Sodium (VOLTAREN) 1 % GEL APPLY 4 GRAMS TOPICALLY 4 (FOUR) TIMES DAILY.  . fexofenadine (ALLEGRA) 180 MG tablet Take 180 mg by mouth daily.  Marland Kitchen FLUoxetine (PROZAC) 20 MG capsule Take 20 mg by mouth daily. Take one time a day  . fluticasone (FLONASE) 50 MCG/ACT nasal spray Place into both nostrils daily.  Marland Kitchen gabapentin (NEURONTIN) 600 MG tablet Take 1 tablet (600 mg total) by mouth at bedtime. Take an additional 600 mg of Gabapentin at night, to make a total of 1200 mg to treat hot flashes. Continue your Gabapentin as prescribed for daytime doses.  . Galcanezumab-gnlm (EMGALITY) 120 MG/ML SOAJ Inject 120 mg into the skin every 30 (thirty) days.  Marland Kitchen levothyroxine (SYNTHROID) 50 MCG tablet TAKE 1 TABLET BY MOUTH EVERY DAY  . Lidocaine (HM LIDOCAINE PATCH) 4 % PTCH Apply 1 patch topically daily.  Marland Kitchen lidocaine-prilocaine (EMLA) cream APPLY 1 APPLICATION TOPICALLY AS NEEDED.  Marland Kitchen lisinopril (ZESTRIL) 20 MG  tablet Take 1 tablet (20 mg total) by mouth daily.  . meloxicam (MOBIC) 7.5 MG tablet Take 1 tablet (7.5 mg total) by mouth 2 (two) times daily as needed for pain.  . Menthol, Topical Analgesic, (BIOFREEZE) 4 % GEL Apply 1 application topically 2 (two) times daily.  . mometasone-formoterol (DULERA) 100-5 MCG/ACT AERO Inhale 2 puffs into the lungs 2 (two) times daily.  . Multiple Vitamins-Minerals (ADEKS PO) Take 1 tablet by mouth daily.  . nitroGLYCERIN (NITRODUR - DOSED IN MG/24 HR) 0.2 mg/hr patch PLACE 1 PATCH ONTO THE SKIN DAILY.  . nitroGLYCERIN (NITROSTAT) 0.4 MG SL tablet PLACE 1 TAB UNDER TONGUE EVERY 5 MINUTES AS NEEDED FOR CHEST PAIN, MAX 3/15 MINS  . topiramate (TOPAMAX) 200 MG tablet Take 1 tablet (200 mg total) by mouth 2 (two) times daily.  . traZODone (DESYREL) 100 MG tablet Take 100 mg by mouth  at bedtime.  Marland Kitchen Ubrogepant (UBRELVY) 50 MG TABS Take 50 mg by mouth as directed. Take 1 tab at onset of migraine. May repeat in 2 hrs, if needed. Max dose: 2 tabs/day. This is a 30 day prescription.  Marland Kitchen VITAMIN D, CHOLECALCIFEROL, PO Take 1 tablet by mouth once a week.   . [DISCONTINUED] carvedilol (COREG) 25 MG tablet Take 25 mg by mouth 2 (two) times daily.  . [DISCONTINUED] diltiazem (CARDIZEM CD) 120 MG 24 hr capsule Take 1 capsule (120 mg total) by mouth every evening. Please make yearly appt with Dr. Meda Coffee for May 2022 for future refills. Thank you 1st attempt  . [DISCONTINUED] gabapentin (NEURONTIN) 600 MG tablet TAKE 1 TABLET BY MOUTH THREE TIMES A DAY  . [DISCONTINUED] lisinopril (ZESTRIL) 20 MG tablet Take 1 tablet (20 mg total) by mouth daily.  . [DISCONTINUED] spironolactone (ALDACTONE) 25 MG tablet Take 1 tablet (25 mg total) by mouth daily.  . [DISCONTINUED] venlafaxine XR (EFFEXOR-XR) 75 MG 24 hr capsule TAKE 1 CAPSULE BY MOUTH EVERY DAY WITH BREAKFAST     Allergies:   Amitriptyline, Amlodipine, Latex, Amantadines, Aspirin, Simvastatin, Clindamycin, Meloxicam, and Tape    Social History   Socioeconomic History  . Marital status: Single    Spouse name: Not on file  . Number of children: 2  . Years of education: college  . Highest education level: Associate degree: academic program  Occupational History  . Occupation: Disabled  Tobacco Use  . Smoking status: Never Smoker  . Smokeless tobacco: Never Used  Vaping Use  . Vaping Use: Never used  Substance and Sexual Activity  . Alcohol use: Yes    Alcohol/week: 1.0 - 3.0 standard drink    Types: 1 - 3 Glasses of wine per week    Comment: occasional  . Drug use: No  . Sexual activity: Not on file  Other Topics Concern  . Not on file  Social History Narrative   Lives at home with her daughter.   Right-handed.   No caffeine per day.   Social Determinants of Health   Financial Resource Strain: Not on file  Food Insecurity: Not on file  Transportation Needs: Not on file  Physical Activity: Not on file  Stress: Not on file  Social Connections: Not on file     Family History: The patient's family history includes Cancer in an other family member; Coronary artery disease in her maternal grandmother; Coronary artery disease (age of onset: 36) in her mother; Hypertension in an other family member; Kidney disease in her father; Other in her father; Stroke in an other family member. There is no history of Breast cancer.  ROS:   Please see the history of present illness.    Review of Systems  Constitutional: Positive for malaise/fatigue. Negative for chills and fever.  HENT: Negative for hearing loss.   Eyes: Negative for blurred vision and redness.  Respiratory: Positive for shortness of breath.   Cardiovascular: Positive for chest pain and leg swelling. Negative for palpitations, orthopnea, claudication and PND.  Gastrointestinal: Negative for blood in stool, melena, nausea and vomiting.  Genitourinary: Negative for dysuria and flank pain.  Musculoskeletal: Positive for joint pain and myalgias.   Neurological: Negative for dizziness and loss of consciousness.  Endo/Heme/Allergies: Negative for polydipsia.  Psychiatric/Behavioral: Negative for substance abuse.    EKGs/Labs/Other Studies Reviewed:    The following studies were reviewed today: [place] Jun 01, 2016 Procedures   Right Heart Cath  Conclusion   Normal right and left  heart filling pressures, no pulmonary hypertension. Normal cardiac output.   No changes.     NST 07/2018 Study Highlights    The left ventricular ejection fraction is mildly decreased (45-54%).  Nuclear stress EF: 50%.  The study is normal.  This is a low risk study.   2D Echo 07/17/18 1. The left ventricle has low normal systolic function, with an ejection fraction of 50-55%. The cavity size was mildly dilated. Left ventricular diastolic Doppler parameters are consistent with impaired relaxation. 2. The right ventricle has normal systolic function. The cavity was normal. There is no increase in right ventricular wall thickness. 3. The mitral valve is normal in structure. 4. The tricuspid valve is normal in structure. 5. The aortic valve is normal in structure. 6. The pulmonic valve was normal in structure. 7. Right atrial pressure is estimated at 3 mmHg.    EKG:  EKG is  ordered today.  The ekg ordered today demonstrates NSR with HR 85  Recent Labs: 10/26/2019: ALT 33; BUN 14; Creatinine, Ser 0.72; Hemoglobin 13.3; Platelets 192; Potassium 3.4; Sodium 142  Recent Lipid Panel    Component Value Date/Time   CHOL 200 (H) 05/21/2017 1111   TRIG 58 05/21/2017 1111   HDL 59 05/21/2017 1111   CHOLHDL 3.4 05/21/2017 1111   CHOLHDL 3 09/30/2012 0929   VLDL 11.2 09/30/2012 0929   LDLCALC 129 (H) 05/21/2017 1111     Risk Assessment/Calculations:       Physical Exam:    VS:  BP 138/80   Pulse 85   Ht 5' 5.5" (1.664 m)   Wt 173 lb 9.6 oz (78.7 kg)   LMP 04/13/2018   BMI 28.45 kg/m     Wt Readings from Last 3 Encounters:   08/23/20 173 lb 9.6 oz (78.7 kg)  06/25/20 163 lb 11.2 oz (74.3 kg)  04/20/20 161 lb (73 kg)     GEN:  Well nourished, well developed in no acute distress HEENT: Normal NECK: No JVD; No carotid bruits CARDIAC: RRR, 2/6 systolic murmur, no rubs or gallops RESPIRATORY:  Clear to auscultation without rales, wheezing or rhonchi  ABDOMEN: Soft, non-tender, non-distended MUSCULOSKELETAL:  Trace edema, warm SKIN: Warm and dry NEUROLOGIC:  Alert and oriented x 3 PSYCHIATRIC:  Normal affect   ASSESSMENT:    1. Pre-operative clearance   2. Chronic diastolic (congestive) heart failure ([place])   3. Essential hypertension   4. OSA (obstructive sleep apnea)   5. HTN, goal below 130/80   6. Other chest pain   7. Murmur    PLAN:    In order of problems listed above:  #Pre-operative evaluation prior to vagus nerve stimulator removal and re-implantation: #Chest Pressure: #DOE: #Prolonged Post-COVID Syndrome: Patient with episodes of chest pressure and SOB since developing COVID in January. Symptoms are mainly exertional in nature. Myoview 2020 negative for ischemia. Cath 2017 negative for obstructive disease. Given exertional symptoms and limited exercise capacity, will obtain stress test for further work-up as well as TTE. -Check myoview as patient's exercise currently limited due to SOB following COVID -Check TTE  #Systolic Murmur: -Check TTE as above  #HTN: Elevated today and at home however has stopped her lisinopril as she was unsure if she should be taking it. -Continue coreg 25mg  BID -Restart lisinopril 20mg  daily -Continue spironolactone 25mg  daily  #Diastolic Dysfunction: Trace LE edema on exam. Given symptoms as detailed above, will repeat TTE. -Check TTE -Manage blood pressure as above -Low Na diet  #OSA: -On home  CPAP  #Obesity: S/p gastric bypass. -Continue healthy diet and lifestyle modifications   Shared Decision Making/Informed Consent The risks [chest  pain, shortness of breath, cardiac arrhythmias, dizziness, blood pressure fluctuations, myocardial infarction, stroke/transient ischemic attack, nausea, vomiting, allergic reaction, radiation exposure, metallic taste sensation and life-threatening complications (estimated to be 1 in 10,000)], benefits (risk stratification, diagnosing coronary artery disease, treatment guidance) and alternatives of a nuclear stress test were discussed in detail with Ms. Faddis and she agrees to proceed.   Medication Adjustments/Labs and Tests Ordered: Current medicines are reviewed at length with the patient today.  Concerns regarding medicines are outlined above.  Orders Placed This Encounter  Procedures  . Cardiac Stress Test: Informed Consent Details: Physician/Practitioner Attestation; Transcribe to consent form and obtain patient signature  . Cardiac Stress Test: Informed Consent Details: Physician/Practitioner Attestation; Transcribe to consent form and obtain patient signature  . Myocardial Perfusion Imaging  . EKG 12-Lead  . ECHOCARDIOGRAM COMPLETE   Meds ordered this encounter  Medications  . spironolactone (ALDACTONE) 25 MG tablet    Sig: Take 1 tablet (25 mg total) by mouth daily.    Dispense:  90 tablet    Refill:  3  . diltiazem (CARDIZEM CD) 120 MG 24 hr capsule    Sig: Take 1 capsule (120 mg total) by mouth every evening.    Dispense:  90 capsule    Refill:  3  . carvedilol (COREG) 25 MG tablet    Sig: Take 1 tablet (25 mg total) by mouth 2 (two) times daily.    Dispense:  60 tablet    Refill:  11  . lisinopril (ZESTRIL) 20 MG tablet    Sig: Take 1 tablet (20 mg total) by mouth daily.    Dispense:  90 tablet    Refill:  3    Patient Instructions  Medication Instructions:  NO CHANGES MAKE SURE AND TAKE LISINOPRIL 20 MG EVERY DAY  *If you need a refill on your cardiac medications before your next appointment, please call your pharmacy*   Lab Work: NONE If you have labs (blood work)  drawn today and your tests are completely normal, you will receive your results only by: Marland Kitchen MyChart Message (if you have MyChart) OR . A paper copy in the mail If you have any lab test that is abnormal or we need to change your treatment, we will call you to review the results.   Testing/Procedures: Your physician has requested that you have an echocardiogram. Echocardiography is a painless test that uses sound waves to create images of your heart. It provides your doctor with information about the size and shape of your heart and how well your heart's chambers and valves are working. This procedure takes approximately one hour. There are no restrictions for this procedure.  Your physician has requested that you have a lexiscan myoview. For further information please visit [email or web address]. Please follow instruction sheet, as given.     Follow-Up: At [facility], you and your health needs are our priority.  As part of our continuing mission to provide you with exceptional heart care, we have created designated Provider Care Teams.  These Care Teams include your primary Cardiologist (physician) and Advanced Practice Providers (APPs -  Physician Assistants and Nurse Practitioners) who all work together to provide you with the care you need, when you need it.  We recommend signing up for the patient portal called "MyChart".  Sign up information is provided on this After Visit Summary.  MyChart is  used to connect with patients for Virtual Visits (Telemedicine).  Patients are able to view lab/test results, encounter notes, upcoming appointments, etc.  Non-urgent messages can be sent to your provider as well.   To learn more about what you can do with MyChart, go to [email or web address].    Your next appointment:   6 week(s)  The format for your next appointment:   In Person  Provider:   Gwyndolyn Kaufman, MD   Other Instructions NONE     Signed, Freada Bergeron, MD   08/23/2020 2:32 PM    [place]

## 2020-08-23 ENCOUNTER — Other Ambulatory Visit: Payer: Self-pay

## 2020-08-23 ENCOUNTER — Encounter: Payer: Self-pay | Admitting: *Deleted

## 2020-08-23 ENCOUNTER — Ambulatory Visit (INDEPENDENT_AMBULATORY_CARE_PROVIDER_SITE_OTHER): Payer: Medicare Other | Admitting: Cardiology

## 2020-08-23 ENCOUNTER — Encounter: Payer: Self-pay | Admitting: Cardiology

## 2020-08-23 VITALS — BP 138/80 | HR 85 | Ht 65.5 in | Wt 173.6 lb

## 2020-08-23 DIAGNOSIS — G4733 Obstructive sleep apnea (adult) (pediatric): Secondary | ICD-10-CM

## 2020-08-23 DIAGNOSIS — I5032 Chronic diastolic (congestive) heart failure: Secondary | ICD-10-CM | POA: Diagnosis not present

## 2020-08-23 DIAGNOSIS — I10 Essential (primary) hypertension: Secondary | ICD-10-CM | POA: Diagnosis not present

## 2020-08-23 DIAGNOSIS — Z01818 Encounter for other preprocedural examination: Secondary | ICD-10-CM | POA: Diagnosis not present

## 2020-08-23 DIAGNOSIS — R011 Cardiac murmur, unspecified: Secondary | ICD-10-CM

## 2020-08-23 DIAGNOSIS — R0789 Other chest pain: Secondary | ICD-10-CM

## 2020-08-23 MED ORDER — DILTIAZEM HCL ER COATED BEADS 120 MG PO CP24: 120.0000 mg | ORAL_CAPSULE | Freq: Every evening | ORAL | 3 refills | Status: DC

## 2020-08-23 MED ORDER — LISINOPRIL 20 MG PO TABS: 20.0000 mg | ORAL_TABLET | Freq: Every day | ORAL | 3 refills | Status: DC

## 2020-08-23 MED ORDER — CARVEDILOL 25 MG PO TABS: 25.0000 mg | ORAL_TABLET | Freq: Two times a day (BID) | ORAL | 11 refills | Status: DC

## 2020-08-23 MED ORDER — SPIRONOLACTONE 25 MG PO TABS: 25.0000 mg | ORAL_TABLET | Freq: Every day | ORAL | 3 refills | Status: DC

## 2020-08-23 NOTE — Patient Instructions (Signed)
Medication Instructions:  NO CHANGES MAKE SURE AND TAKE LISINOPRIL 20 MG EVERY DAY  *If you need a refill on your cardiac medications before your next appointment, please call your pharmacy*   Lab Work: NONE If you have labs (blood work) drawn today and your tests are completely normal, you will receive your results only by: Marland Kitchen MyChart Message (if you have MyChart) OR . A paper copy in the mail If you have any lab test that is abnormal or we need to change your treatment, we will call you to review the results.   Testing/Procedures: Your physician has requested that you have an echocardiogram. Echocardiography is a painless test that uses sound waves to create images of your heart. It provides your doctor with information about the size and shape of your heart and how well your heart's chambers and valves are working. This procedure takes approximately one hour. There are no restrictions for this procedure.  Your physician has requested that you have a lexiscan myoview. For further information please visit [email or web address]. Please follow instruction sheet, as given.     Follow-Up: At [facility], you and your health needs are our priority.  As part of our continuing mission to provide you with exceptional heart care, we have created designated Provider Care Teams.  These Care Teams include your primary Cardiologist (physician) and Advanced Practice Providers (APPs -  Physician Assistants and Nurse Practitioners) who all work together to provide you with the care you need, when you need it.  We recommend signing up for the patient portal called "MyChart".  Sign up information is provided on this After Visit Summary.  MyChart is used to connect with patients for Virtual Visits (Telemedicine).  Patients are able to view lab/test results, encounter notes, upcoming appointments, etc.  Non-urgent messages can be sent to your provider as well.   To learn more about what you can do with  MyChart, go to [email or web address].    Your next appointment:   6 week(s)  The format for your next appointment:   In Person  Provider:   Gwyndolyn Kaufman, MD   Other Instructions NONE

## 2020-08-24 ENCOUNTER — Ambulatory Visit: Payer: Medicare Other | Admitting: Occupational Therapy

## 2020-08-24 ENCOUNTER — Encounter: Payer: Self-pay | Admitting: Occupational Therapy

## 2020-08-24 DIAGNOSIS — M5416 Radiculopathy, lumbar region: Secondary | ICD-10-CM | POA: Diagnosis not present

## 2020-08-24 DIAGNOSIS — R208 Other disturbances of skin sensation: Secondary | ICD-10-CM

## 2020-08-24 DIAGNOSIS — M545 Low back pain, unspecified: Secondary | ICD-10-CM | POA: Diagnosis not present

## 2020-08-24 DIAGNOSIS — M79602 Pain in left arm: Secondary | ICD-10-CM

## 2020-08-24 DIAGNOSIS — R262 Difficulty in walking, not elsewhere classified: Secondary | ICD-10-CM | POA: Diagnosis not present

## 2020-08-24 DIAGNOSIS — M6281 Muscle weakness (generalized): Secondary | ICD-10-CM

## 2020-08-24 DIAGNOSIS — M542 Cervicalgia: Secondary | ICD-10-CM | POA: Diagnosis not present

## 2020-08-24 DIAGNOSIS — M79601 Pain in right arm: Secondary | ICD-10-CM

## 2020-08-24 DIAGNOSIS — G8929 Other chronic pain: Secondary | ICD-10-CM | POA: Diagnosis not present

## 2020-08-24 DIAGNOSIS — M546 Pain in thoracic spine: Secondary | ICD-10-CM | POA: Diagnosis not present

## 2020-08-24 DIAGNOSIS — M62830 Muscle spasm of back: Secondary | ICD-10-CM | POA: Diagnosis not present
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
## 2020-08-25 ENCOUNTER — Telehealth (HOSPITAL_COMMUNITY): Payer: Self-pay | Admitting: *Deleted

## 2020-08-25 NOTE — Telephone Encounter (Signed)
Patient given detailed instructions per Myocardial Perfusion Study Information Sheet for the test on 09/01/21 at 0745. Patient notified to arrive 15 minutes early and that it is imperative to arrive on time for appointment to keep from having the test rescheduled.  If you need to cancel or reschedule your appointment, please call the office within 24 hours of your appointment. . Patient verbalized understanding.[name], [name]

## 2020-08-27 ENCOUNTER — Ambulatory Visit: Payer: Medicare Other | Admitting: Occupational Therapy

## 2020-08-27 ENCOUNTER — Other Ambulatory Visit: Payer: Self-pay

## 2020-08-27 DIAGNOSIS — R262 Difficulty in walking, not elsewhere classified: Secondary | ICD-10-CM | POA: Diagnosis not present

## 2020-08-27 DIAGNOSIS — M542 Cervicalgia: Secondary | ICD-10-CM | POA: Diagnosis not present

## 2020-08-27 DIAGNOSIS — M545 Low back pain, unspecified: Secondary | ICD-10-CM | POA: Diagnosis not present

## 2020-08-27 DIAGNOSIS — R208 Other disturbances of skin sensation: Secondary | ICD-10-CM

## 2020-08-27 DIAGNOSIS — M79602 Pain in left arm: Secondary | ICD-10-CM

## 2020-08-27 DIAGNOSIS — M62830 Muscle spasm of back: Secondary | ICD-10-CM | POA: Diagnosis not present

## 2020-08-27 DIAGNOSIS — M6281 Muscle weakness (generalized): Secondary | ICD-10-CM | POA: Diagnosis not present

## 2020-08-27 DIAGNOSIS — M79601 Pain in right arm: Secondary | ICD-10-CM | POA: Diagnosis not present

## 2020-08-27 DIAGNOSIS — M546 Pain in thoracic spine: Secondary | ICD-10-CM | POA: Diagnosis not present

## 2020-08-27 DIAGNOSIS — M5416 Radiculopathy, lumbar region: Secondary | ICD-10-CM | POA: Diagnosis not present

## 2020-08-27 DIAGNOSIS — G8929 Other chronic pain: Secondary | ICD-10-CM | POA: Diagnosis not present
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
## 2020-08-30 ENCOUNTER — Other Ambulatory Visit: Payer: Self-pay

## 2020-08-30 ENCOUNTER — Ambulatory Visit (HOSPITAL_COMMUNITY): Payer: Medicare Other | Attending: Internal Medicine

## 2020-08-30 DIAGNOSIS — R011 Cardiac murmur, unspecified: Secondary | ICD-10-CM | POA: Insufficient documentation

## 2020-08-30 DIAGNOSIS — R0789 Other chest pain: Secondary | ICD-10-CM | POA: Insufficient documentation

## 2020-08-30 LAB — ECHOCARDIOGRAM COMPLETE
AR max vel: 1.88 cm2
AV Area VTI: 2.02 cm2
AV Area mean vel: 1.97 cm2
AV Mean grad: 8 mmHg
AV Peak grad: 14.7 mmHg
Ao pk vel: 1.92 m/s
Area-P 1/2: 2.91 cm2
P 1/2 time: 355 msec
S' Lateral: 2.8 cm

## 2020-08-31 ENCOUNTER — Ambulatory Visit: Payer: Medicare Other | Admitting: Occupational Therapy

## 2020-08-31 DIAGNOSIS — Z01810 Encounter for preprocedural cardiovascular examination: Secondary | ICD-10-CM | POA: Diagnosis not present

## 2020-08-31 DIAGNOSIS — I252 Old myocardial infarction: Secondary | ICD-10-CM | POA: Diagnosis not present

## 2020-08-31 DIAGNOSIS — Z01818 Encounter for other preprocedural examination: Secondary | ICD-10-CM | POA: Diagnosis not present

## 2020-08-31 DIAGNOSIS — I517 Cardiomegaly: Secondary | ICD-10-CM | POA: Diagnosis not present

## 2020-08-31 DIAGNOSIS — I5032 Chronic diastolic (congestive) heart failure: Secondary | ICD-10-CM | POA: Diagnosis not present

## 2020-08-31 DIAGNOSIS — J4550 Severe persistent asthma, uncomplicated: Secondary | ICD-10-CM | POA: Diagnosis not present

## 2020-08-31 DIAGNOSIS — I110 Hypertensive heart disease with heart failure: Secondary | ICD-10-CM | POA: Diagnosis not present

## 2020-08-31 DIAGNOSIS — I2510 Atherosclerotic heart disease of native coronary artery without angina pectoris: Secondary | ICD-10-CM | POA: Diagnosis not present

## 2020-09-01 ENCOUNTER — Ambulatory Visit (HOSPITAL_COMMUNITY): Payer: Medicare Other | Attending: Cardiovascular Disease

## 2020-09-01 ENCOUNTER — Encounter (HOSPITAL_COMMUNITY): Payer: Self-pay | Admitting: *Deleted

## 2020-09-01 ENCOUNTER — Other Ambulatory Visit: Payer: Self-pay

## 2020-09-01 DIAGNOSIS — R0789 Other chest pain: Secondary | ICD-10-CM

## 2020-09-01 LAB — MYOCARDIAL PERFUSION IMAGING
LV dias vol: 101 mL (ref 46–106)
LV sys vol: 48 mL
Peak HR: 108 {beats}/min
Rest HR: 79 {beats}/min
SDS: 0
SRS: 0
SSS: 0
TID: 1.07

## 2020-09-01 MED ORDER — TECHNETIUM TC 99M TETROFOSMIN IV KIT
10.6000 | PACK | Freq: Once | INTRAVENOUS | Status: AC | PRN
  Administered 2020-09-01: 10.6 via INTRAVENOUS
  Filled 2020-09-01: qty 11

## 2020-09-01 MED ORDER — TECHNETIUM TC 99M TETROFOSMIN IV KIT
31.5000 | PACK | Freq: Once | INTRAVENOUS | Status: AC | PRN
  Administered 2020-09-01: 31.5 via INTRAVENOUS
  Filled 2020-09-01: qty 32

## 2020-09-01 MED ORDER — ADENOSINE 6 MG/2ML IV SOLN
44.0000 mg | Freq: Once | INTRAVENOUS | Status: AC
  Administered 2020-09-01: 44 mg via INTRAVENOUS

## 2020-09-02 ENCOUNTER — Ambulatory Visit: Payer: Medicare Other | Admitting: Occupational Therapy

## 2020-09-02 ENCOUNTER — Encounter: Payer: Self-pay | Admitting: Occupational Therapy

## 2020-09-02 DIAGNOSIS — R208 Other disturbances of skin sensation: Secondary | ICD-10-CM | POA: Diagnosis not present

## 2020-09-02 DIAGNOSIS — G8929 Other chronic pain: Secondary | ICD-10-CM | POA: Diagnosis not present

## 2020-09-02 DIAGNOSIS — M542 Cervicalgia: Secondary | ICD-10-CM | POA: Diagnosis not present

## 2020-09-02 DIAGNOSIS — M5416 Radiculopathy, lumbar region: Secondary | ICD-10-CM | POA: Diagnosis not present

## 2020-09-02 DIAGNOSIS — M79602 Pain in left arm: Secondary | ICD-10-CM | POA: Diagnosis not present

## 2020-09-02 DIAGNOSIS — M546 Pain in thoracic spine: Secondary | ICD-10-CM | POA: Diagnosis not present

## 2020-09-02 DIAGNOSIS — R262 Difficulty in walking, not elsewhere classified: Secondary | ICD-10-CM | POA: Diagnosis not present

## 2020-09-02 DIAGNOSIS — M79601 Pain in right arm: Secondary | ICD-10-CM

## 2020-09-02 DIAGNOSIS — M6281 Muscle weakness (generalized): Secondary | ICD-10-CM

## 2020-09-02 DIAGNOSIS — M545 Low back pain, unspecified: Secondary | ICD-10-CM | POA: Diagnosis not present

## 2020-09-02 DIAGNOSIS — M62830 Muscle spasm of back: Secondary | ICD-10-CM | POA: Diagnosis not present
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
## 2020-09-03 ENCOUNTER — Ambulatory Visit: Payer: Medicare Other | Attending: Orthopedic Surgery | Admitting: Occupational Therapy

## 2020-09-03 ENCOUNTER — Other Ambulatory Visit: Payer: Self-pay

## 2020-09-03 ENCOUNTER — Encounter: Payer: Self-pay | Admitting: Occupational Therapy

## 2020-09-03 DIAGNOSIS — R208 Other disturbances of skin sensation: Secondary | ICD-10-CM | POA: Insufficient documentation

## 2020-09-03 DIAGNOSIS — M79601 Pain in right arm: Secondary | ICD-10-CM | POA: Insufficient documentation

## 2020-09-03 DIAGNOSIS — M79602 Pain in left arm: Secondary | ICD-10-CM | POA: Insufficient documentation

## 2020-09-03 DIAGNOSIS — M6281 Muscle weakness (generalized): Secondary | ICD-10-CM | POA: Diagnosis not present
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
## 2020-09-08 ENCOUNTER — Encounter: Payer: Self-pay | Admitting: *Deleted

## 2020-09-08 NOTE — Progress Notes (Unsigned)

## 2020-09-09 ENCOUNTER — Other Ambulatory Visit: Payer: Self-pay

## 2020-09-09 ENCOUNTER — Ambulatory Visit: Payer: Medicare Other | Admitting: Behavioral Health

## 2020-09-09 DIAGNOSIS — F418 Other specified anxiety disorders: Secondary | ICD-10-CM

## 2020-09-09 NOTE — BH Specialist Note (Signed)
Integrated Behavioral Health via Telemedicine Visit  09/09/2020 [name] [id number]  Number of Integrated Behavioral Health visits: 3/6 Session Start time: 2:00pm  Session End time: 2:45pm Total time: 29   Referring Provider: Dr. Cato Mulligan, MD Patient/Family location: Pt is home in private [facility] Provider location: [facility] Office All persons participating in visit: Pt & Clinician Types of Service: Individual psychotherapy  I connected with [name] and/or [name]'s self via  Telephone or Geologist, engineering  (Video is Tree surgeon) and verified that I am speaking with the correct person using two identifiers. Discussed confidentiality: Yes   I discussed the limitations of telemedicine and the availability of in person appointments.  Discussed there is a possibility of technology failure and discussed alternative modes of communication if that failure occurs.  I discussed that engaging in this telemedicine visit, they consent to the provision of behavioral healthcare and the services will be billed under their insurance.  Patient and/or legal guardian expressed understanding and consented to Telemedicine visit: Yes   Presenting Concerns: Patient and/or family reports the following symptoms/concerns: elevated anxiety due to probable surgery r/s'g Duration of problem: months to yrs; Severity of problem: moderate  Patient and/or Family's Strengths/Protective Factors: Social and Emotional competence, Concrete supports in place (healthy food, safe environments, etc.) and Sense of purpose  Goals Addressed: Patient will: 1.  Reduce symptoms of: anxiety, depression and stress  2.  Increase knowledge and/or ability of: coping skills and stress reduction  3.  Demonstrate ability to: Increase healthy adjustment to current life circumstances  Progress towards Goals: Ongoing  Interventions: Interventions utilized:  Solution-Focused Strategies  and Supportive Counseling Standardized Assessments completed: Not Needed  Patient and/or Family Response: Pt receptive to visit today & requesting future sessions  Assessment: Patient currently experiencing inc'd anxiety, stress, & dep Sx due to pending surgery & probable r/s to earlier in the week. This may delay surgery until June; worry for her Dtr that is challenged by Autism, & her Son's schedule as a Veterinary surgeon for Restaurant in [place].  Patient may benefit from cont'd support through this difficult time, coping skills for surgery & health status changes.   Plan: 1. Follow up with behavioral health clinician on : 2-3 wks on telehealth for 60 min 2. Behavioral recommendations: Talk to your Dtr differently-She will shine after surgery according to your expectations. Trust your [name] will be supportive of the need for an Aide in the home post surgery. Trust your Son when he tells you, "I will be there for you Mom!" 3. Referral(s): [facility] (In Clinic)  I discussed the assessment and treatment plan with the patient and/or parent/guardian. They were provided an opportunity to ask questions and all were answered. They agreed with the plan and demonstrated an understanding of the instructions.   They were advised to call back or seek an in-person evaluation if the symptoms worsen or if the condition fails to improve as anticipated.  Donnetta Hutching, LMFT

## 2020-09-10 ENCOUNTER — Telehealth: Payer: Self-pay

## 2020-09-10 NOTE — Telephone Encounter (Signed)
Return pt's call who states her surgery has been moved up to Monday 4/11 @ 0700 AM. And she will need assistance at home with her ADL's ie as a CNA. States she was told by someone that she did not need to come in prior to surgery to discuss the surgery and needs afterward. I asked if she had discussed this with the surgeon; stated she did not; told her this would be a good idea since the doctor would know what she ccn and cannot do. She states she will mention this but may be referred back to her PCP. States the plan is for her to go home the same day.

## 2020-09-10 NOTE — Telephone Encounter (Signed)
Okay, the surgeon show be able to order her home health aide or PT for her. If they don't, we can do it after her surgery based on the restrictions given by the surgeon.

## 2020-09-10 NOTE — Telephone Encounter (Signed)
Pt called / informed of Dr Monte Fantasia response; voiced understanding.

## 2020-09-10 NOTE — Telephone Encounter (Signed)
Pt is wanting a nurse to come out after surgery on Monday pls contact [phone]

## 2020-09-13 DIAGNOSIS — Z45010 Encounter for checking and testing of cardiac pacemaker pulse generator [battery]: Secondary | ICD-10-CM | POA: Diagnosis not present

## 2020-09-13 DIAGNOSIS — I5032 Chronic diastolic (congestive) heart failure: Secondary | ICD-10-CM | POA: Diagnosis not present

## 2020-09-13 DIAGNOSIS — Z4549 Encounter for adjustment and management of other implanted nervous system device: Secondary | ICD-10-CM | POA: Diagnosis not present

## 2020-09-13 DIAGNOSIS — T85890A Other specified complication of nervous system prosthetic devices, implants and grafts, initial encounter: Secondary | ICD-10-CM | POA: Diagnosis not present

## 2020-09-13 DIAGNOSIS — G40019 Localization-related (focal) (partial) idiopathic epilepsy and epileptic syndromes with seizures of localized onset, intractable, without status epilepticus: Secondary | ICD-10-CM | POA: Diagnosis not present

## 2020-09-13 DIAGNOSIS — I2510 Atherosclerotic heart disease of native coronary artery without angina pectoris: Secondary | ICD-10-CM | POA: Diagnosis not present

## 2020-09-13 DIAGNOSIS — G40919 Epilepsy, unspecified, intractable, without status epilepticus: Secondary | ICD-10-CM | POA: Diagnosis not present

## 2020-09-13 DIAGNOSIS — Z4542 Encounter for adjustment and management of neuropacemaker (brain) (peripheral nerve) (spinal cord): Secondary | ICD-10-CM | POA: Diagnosis not present

## 2020-09-13 DIAGNOSIS — I252 Old myocardial infarction: Secondary | ICD-10-CM | POA: Diagnosis not present

## 2020-09-13 DIAGNOSIS — Z79899 Other long term (current) drug therapy: Secondary | ICD-10-CM | POA: Diagnosis not present

## 2020-09-13 DIAGNOSIS — J4550 Severe persistent asthma, uncomplicated: Secondary | ICD-10-CM | POA: Diagnosis not present

## 2020-09-13 DIAGNOSIS — I110 Hypertensive heart disease with heart failure: Secondary | ICD-10-CM | POA: Diagnosis not present

## 2020-09-15 ENCOUNTER — Other Ambulatory Visit: Payer: Self-pay | Admitting: Student

## 2020-09-15 NOTE — Progress Notes (Unsigned)
Things That May Be Affecting Your Health:  Alcohol  Hearing loss X Pain   X Depression  Home Safety  Sexual Health   Diabetes  Lack of physical activity  Stress  X Difficulty with daily activities  Loneliness  Tiredness   Drug use X Medicines  Tobacco use   Falls  Motor Vehicle Safety  Weight   Food choices  Oral Health  Other    YOUR PERSONALIZED HEALTH PLAN : 1. Schedule your next subsequent Medicare Wellness visit in one year 2. Attend all of your regular appointments to address your medical issues 3. Complete the preventative screenings and services   Annual Wellness Visit   Medicare Covered Preventative Screenings and [place] Men and Women Who How Often Need? Date of Last Service Action  Abdominal Aortic Aneurysm Adults with AAA risk factors Once      Alcohol Misuse and Counseling All Adults Screening once a year if no alcohol misuse. Counseling up to 4 face to face sessions.     Bone Density Measurement  Adults at risk for osteoporosis Once every 2 yrs      Lipid Panel Z13.6 All adults without CV disease Once every 5 yrs YES Last Lipid panel was may 2021 and LDL was 129     Colorectal Cancer   Stool sample or  Colonoscopy All adults 16 and older   Once every year  Every 10 years        Depression All Adults Once a year YES Today   Diabetes Screening Blood glucose, post glucose load, or GTT Z13.1  All adults at risk  Pre-diabetics  Once per year  Twice per year YES  06/25/2019   Diabetes  Self-Management Training All adults Diabetics 10 hrs first year; 2 hours subsequent years. Requires Copay     Glaucoma  Diabetics  Family history of glaucoma  African Americans 92 yrs +  Hispanic Americans 60 yrs + Annually - requires coppay      Hepatitis C Z72.89 or F19.20  High Risk for HCV  Born between 1945 and 1965  Annually  Once      HIV Z11.4 All adults based on risk  Annually btw ages 84 & 43 regardless of risk  Annually  > 65 yrs if at increased risk      Lung Cancer Screening Asymptomatic adults aged 90-77 with 30 pack yr history and current smoker OR quit within the last 15 yrs Annually Must have counseling and shared decision making documentation before first screen      Medical Nutrition Therapy Adults with   Diabetes  Renal disease  Kidney transplant within past 3 yrs 3 hours first year; 2 hours subsequent years     Obesity and Counseling All adults Screening once a year Counseling if BMI 30 or higher  Today   Tobacco Use Counseling Adults who use tobacco  Up to 8 visits in one year     Vaccines Z23  Hepatitis B  Influenza   Pneumonia  Adults   Once  Once every flu season  Two different vaccines separated by one year     Next Annual Wellness Visit People with Medicare Every year  Today     Services & Screenings Women Who How Often Need  Date of Last Service Action  Mammogram  Z12.31 Women over 64 One baseline ages 51-39. Annually ager 40 yrs+      Pap tests All women Annually if high risk. Every 2 yrs  for normal risk women      Screening for cervical cancer with   Pap (Z01.419 nl or Z01.411abnl) &  HPV Z11.51 Women aged 89 to 81 Once every 5 yrs     Screening pelvic and breast exams All women Annually if high risk. Every 2 yrs for normal risk women     Sexually Transmitted Diseases  Chlamydia  Gonorrhea  Syphilis All at risk adults Annually for non pregnant females at increased risk         [place] Men Who How Ofter Need  Date of Last Service Action  Prostate Cancer - DRE & PSA Men over 50 Annually.  DRE might require a copay.        Sexually Transmitted Diseases  Syphilis All at risk adults Annually for men at increased risk      Health Maintenance List Health Maintenance  Topic Date Due  . COVID-19 Vaccine (1) Never done  . TETANUS/TDAP  Never done  . INFLUENZA VACCINE  01/03/2021  . MAMMOGRAM  03/31/2022  . PAP SMEAR-Modifier  04/21/2023   . COLONOSCOPY (Pts 45-74yrs Insurance coverage will need to be confirmed)  12/14/2024  . Hepatitis C Screening  Completed  . HIV Screening  Completed  . HPV VACCINES  Aged Out

## 2020-09-28 ENCOUNTER — Ambulatory Visit (INDEPENDENT_AMBULATORY_CARE_PROVIDER_SITE_OTHER): Payer: Medicare Other | Admitting: Neurology

## 2020-09-28 ENCOUNTER — Encounter: Payer: Self-pay | Admitting: Neurology

## 2020-09-28 ENCOUNTER — Other Ambulatory Visit: Payer: Self-pay

## 2020-09-28 VITALS — BP 122/83 | HR 80 | Ht 65.0 in | Wt 169.0 lb

## 2020-09-28 DIAGNOSIS — Z9689 Presence of other specified functional implants: Secondary | ICD-10-CM

## 2020-09-28 DIAGNOSIS — G40019 Localization-related (focal) (partial) idiopathic epilepsy and epileptic syndromes with seizures of localized onset, intractable, without status epilepticus: Secondary | ICD-10-CM | POA: Diagnosis not present

## 2020-09-28 DIAGNOSIS — N951 Menopausal and female climacteric states: Secondary | ICD-10-CM

## 2020-09-28 DIAGNOSIS — R569 Unspecified convulsions: Secondary | ICD-10-CM | POA: Insufficient documentation

## 2020-09-28 HISTORY — DX: Presence of other specified functional implants: Z96.89

## 2020-09-28 MED ORDER — GABAPENTIN 600 MG PO TABS: 600.0000 mg | ORAL_TABLET | Freq: Three times a day (TID) | ORAL | 6 refills | Status: DC
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
## 2020-09-29 DIAGNOSIS — F4310 Post-traumatic stress disorder, unspecified: Secondary | ICD-10-CM | POA: Diagnosis not present

## 2020-09-29 DIAGNOSIS — F331 Major depressive disorder, recurrent, moderate: Secondary | ICD-10-CM | POA: Diagnosis not present

## 2020-09-29 DIAGNOSIS — F411 Generalized anxiety disorder: Secondary | ICD-10-CM | POA: Diagnosis not present

## 2020-10-06 ENCOUNTER — Other Ambulatory Visit: Payer: Self-pay

## 2020-10-06 ENCOUNTER — Ambulatory Visit: Payer: Medicare Other | Admitting: Behavioral Health

## 2020-10-06 DIAGNOSIS — F419 Anxiety disorder, unspecified: Secondary | ICD-10-CM

## 2020-10-06 DIAGNOSIS — F331 Major depressive disorder, recurrent, moderate: Secondary | ICD-10-CM

## 2020-10-12 ENCOUNTER — Ambulatory Visit: Payer: Medicare Other | Admitting: Cardiology

## 2020-10-12 DIAGNOSIS — Z9889 Other specified postprocedural states: Secondary | ICD-10-CM | POA: Diagnosis not present

## 2020-10-12 DIAGNOSIS — Z9689 Presence of other specified functional implants: Secondary | ICD-10-CM | POA: Diagnosis not present
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
## 2020-10-18 ENCOUNTER — Other Ambulatory Visit: Payer: Self-pay

## 2020-10-18 ENCOUNTER — Emergency Department (HOSPITAL_COMMUNITY)
Admission: EM | Admit: 2020-10-18 | Discharge: 2020-10-18 | Disposition: A | Discharge status: Home / Self Care | Payer: Medicare Other | Attending: Emergency Medicine | Admitting: Emergency Medicine

## 2020-10-18 ENCOUNTER — Encounter (HOSPITAL_COMMUNITY): Payer: Self-pay | Admitting: *Deleted

## 2020-10-18 DIAGNOSIS — N764 Abscess of vulva: Secondary | ICD-10-CM | POA: Diagnosis not present

## 2020-10-18 DIAGNOSIS — R102 Pelvic and perineal pain: Secondary | ICD-10-CM | POA: Diagnosis present

## 2020-10-18 DIAGNOSIS — Z7951 Long term (current) use of inhaled steroids: Secondary | ICD-10-CM | POA: Insufficient documentation

## 2020-10-18 DIAGNOSIS — E039 Hypothyroidism, unspecified: Secondary | ICD-10-CM | POA: Insufficient documentation

## 2020-10-18 DIAGNOSIS — Z955 Presence of coronary angioplasty implant and graft: Secondary | ICD-10-CM | POA: Diagnosis not present

## 2020-10-18 DIAGNOSIS — Z91040 Latex allergy status: Secondary | ICD-10-CM | POA: Diagnosis not present

## 2020-10-18 DIAGNOSIS — J45909 Unspecified asthma, uncomplicated: Secondary | ICD-10-CM | POA: Insufficient documentation

## 2020-10-18 DIAGNOSIS — I5032 Chronic diastolic (congestive) heart failure: Secondary | ICD-10-CM | POA: Diagnosis not present

## 2020-10-18 DIAGNOSIS — I110 Hypertensive heart disease with heart failure: Secondary | ICD-10-CM | POA: Insufficient documentation

## 2020-10-18 DIAGNOSIS — Z79899 Other long term (current) drug therapy: Secondary | ICD-10-CM | POA: Insufficient documentation

## 2020-10-18 MED ORDER — DOXYCYCLINE HYCLATE 100 MG PO CAPS: 100.0000 mg | ORAL_CAPSULE | Freq: Two times a day (BID) | ORAL | 0 refills | Status: DC

## 2020-10-18 MED ORDER — LIDOCAINE-EPINEPHRINE (PF) 2 %-1:200000 IJ SOLN
INTRAMUSCULAR | Status: AC
  Administered 2020-10-18: 10 mL
  Filled 2020-10-18: qty 20

## 2020-10-18 MED ORDER — LIDOCAINE-EPINEPHRINE (PF) 2 %-1:200000 IJ SOLN: 10.0000 mL | Freq: Once | INTRAMUSCULAR | Status: DC

## 2020-10-18 MED ORDER — LIDOCAINE HCL 2 % IJ SOLN
15.0000 mL | Freq: Once | INTRAMUSCULAR | Status: DC
  Filled 2020-10-18: qty 20

## 2020-10-18 MED ORDER — OXYCODONE-ACETAMINOPHEN 5-325 MG PO TABS
1.0000 | ORAL_TABLET | Freq: Once | ORAL | Status: AC
  Administered 2020-10-18: 1 via ORAL
  Filled 2020-10-18: qty 1

## 2020-10-18 MED ORDER — HYDROCODONE-ACETAMINOPHEN 5-325 MG PO TABS: 1.0000 | ORAL_TABLET | ORAL | 0 refills | Status: DC | PRN

## 2020-10-18 MED ORDER — DOXYCYCLINE HYCLATE 100 MG PO TABS
100.0000 mg | ORAL_TABLET | Freq: Once | ORAL | Status: AC
  Administered 2020-10-18: 100 mg via ORAL
  Filled 2020-10-18: qty 1

## 2020-10-18 NOTE — Discharge Instructions (Addendum)
You were seen in the emergency department today for the warmth, redness, and pain you are right groin.  You were found to have a skin infection of the tissue of your vulva.  We attempted incision and drainage with some relief of pus within the area, however there is no large pocket of fluid to be drained.  There is primarily swollen, inflamed tissue which will improve with antibiotic therapy.  You administered your first dose of antibiotics in the emergency department and prescribed antibiotics to take at home for the next 10 days.  Please complete the entire course as prescribed.  Additionally you have been prescribed a few pain pills to help relieve her symptoms for the next few days.  May continue take over-the-counter medications for pain as well, however do not take any additional Tylenol when you are taking these pain pills as they contain Tylenol in addition to oxycodone.  Please call your OB/GYN and follow-up in their office in the next week.  Return to the emergency department if you develop any worsening swelling, pain, fevers, chills, nausea or vomiting that does not stop, or difficulty urinating, or any other new severe symptoms.

## 2020-10-18 NOTE — ED Provider Notes (Signed)
Emergency Medicine Provider Triage Evaluation Note  DANEISHA SURGES , a 55 y.o. female  was evaluated in triage.  Pt complains of vaginal abscess x 1 week. No relieve despite warm compresses.  Review of Systems  Positive: wound Negative: Fever, urinary symptoms  Physical Exam  BP (!) 151/98 (BP Location: Right Arm)   Pulse 75   Temp 98.1 F (36.7 C)   Resp 16   LMP 04/13/2018   SpO2 100%  Gen:   Awake, no distress   Resp:  Normal effort  MSK:   Moves extremities without difficulty  Other:    Medical Decision Making  Medically screening exam initiated at 12:49 PM.  Appropriate orders placed.  [name] was informed that the remainder of the evaluation will be completed by another provider, this initial triage assessment does not replace that evaluation, and the importance of remaining in the ED until their evaluation is complete.  Patient here with vaginal abscess x 1 week. No fever, no relieve despite warm compresses.    Janeece Fitting, PA-C 10/18/20 1251    Lucrezia Starch, MD 10/20/20 2103

## 2020-10-18 NOTE — ED Triage Notes (Signed)
Pt reports having boils to vaginal area x 1 week. No fever. No acute distress noted at triage.
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
## 2020-10-26 ENCOUNTER — Encounter: Payer: Self-pay | Admitting: Neurology

## 2020-10-26 ENCOUNTER — Ambulatory Visit (INDEPENDENT_AMBULATORY_CARE_PROVIDER_SITE_OTHER): Payer: Medicare Other | Admitting: Neurology

## 2020-10-26 VITALS — BP 109/65 | HR 94 | Ht 65.5 in | Wt 175.0 lb

## 2020-10-26 DIAGNOSIS — Z9689 Presence of other specified functional implants: Secondary | ICD-10-CM | POA: Diagnosis not present

## 2020-10-26 DIAGNOSIS — G40019 Localization-related (focal) (partial) idiopathic epilepsy and epileptic syndromes with seizures of localized onset, intractable, without status epilepticus: Secondary | ICD-10-CM | POA: Diagnosis not present

## 2020-10-26 DIAGNOSIS — R569 Unspecified convulsions: Secondary | ICD-10-CM

## 2020-10-26 MED ORDER — TOPIRAMATE 200 MG PO TABS: 200.0000 mg | ORAL_TABLET | Freq: Two times a day (BID) | ORAL | 4 refills | Status: AC
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
## 2020-11-02 ENCOUNTER — Ambulatory Visit (INDEPENDENT_AMBULATORY_CARE_PROVIDER_SITE_OTHER): Payer: Medicare Other | Admitting: Obstetrics & Gynecology

## 2020-11-02 ENCOUNTER — Ambulatory Visit: Payer: Medicare Other | Admitting: Behavioral Health

## 2020-11-02 ENCOUNTER — Other Ambulatory Visit: Payer: Self-pay

## 2020-11-02 VITALS — BP 154/94 | HR 77 | Wt 176.8 lb

## 2020-11-02 DIAGNOSIS — L0291 Cutaneous abscess, unspecified: Secondary | ICD-10-CM | POA: Diagnosis not present

## 2020-11-02 DIAGNOSIS — L732 Hidradenitis suppurativa: Secondary | ICD-10-CM

## 2020-11-02 DIAGNOSIS — F419 Anxiety disorder, unspecified: Secondary | ICD-10-CM

## 2020-11-02 DIAGNOSIS — F331 Major depressive disorder, recurrent, moderate: Secondary | ICD-10-CM

## 2020-11-02 MED ORDER — AMOXICILLIN-POT CLAVULANATE 875-125 MG PO TABS: 1.0000 | ORAL_TABLET | Freq: Two times a day (BID) | ORAL | 1 refills | Status: DC

## 2020-11-02 NOTE — Progress Notes (Signed)
Patient ID: [name], female   DOB: 13-Mar-1966, 55 y.o.   MRN: [id number]  Chief Complaint  Patient presents with  . Follow-up    HPI [name] is a 55 y.o. female.  Patient was seen in the ED with boils in the groin and vulva and abscess was drained. She was sent here for f/u. She has had skin abscesses all over her body for years and has had lesions drained and excised. Her problem list contains hydradenitis although she though she did not have a diagnosis and she has never seen a dermatologist. HPI  Past Medical History:  Diagnosis Date  . Anxiety   . Asthma   . Biliary dyskinesia    a. s/p cholecystectomy.  Marland Kitchen BMI 40.0-44.9, adult ([place]) 06/11/2015  . 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 Abnl stress [place] 2008, f/u cath reportedly nl. b. ETT-Myoview 04/2011 - EKG changes but normal perfusion. Cor CT - no coronary calcium, no definite stenosis though mRCA not fully evaluated. c. 10/2011 - tn elevated in [place], LHC without CAD. Started on Ranexa, anti-anginals ?microvascular dz but later stopped while in hospital on abx.  . Complication of anesthesia    hard to wake up-had to be reminded to breath  . Diarrhea 08/09/2017  . GERD (gastroesophageal reflux disease)    a. Severe.  Marland Kitchen History of seizure 10/19/2017  . HTN (hypertension)   . Hx of cardiovascular stress test    [place] Myoview 8/14:  Normal, EF 74%  . Hx of echocardiogram    Echo 3/16:  Mild LVH, EF 55-60%, Gr 1 DD, trivial MR, mild LAE, normal RVF  . Hydradenitis 11/09/2016  . Hypothyroid   . Hypothyroidism 07/02/2013   Overview:  Last Assessment & Plan:   She reports a 21 pound increase in her weight since October. She doesn't know when the last time her TSH was checked so we will do that today.  . Low back pain 04/19/2016  . MI (myocardial infarction) ([place])   . Migraine   . Morbid obesity ([place]) 07/26/2017  . MRSA infection    a. After vagal nerve stimulator at [facility] - surgical site MRSA  infection, PICC placed.  . Obesity   . OSA (obstructive sleep apnea) 07/26/2017   uses CPAP nightly  . Palpitations    a. 08/2014: 48 hour holter with 2 PVCs otherwise normal.  . Pre-diabetes   . Rectal pain 12/19/2017  . Seizure disorder ([place])    a. since childhood. b. s/p vagal nerve stimulator at [facility].  . Seizures ([place])     Past Surgical History:  Procedure Laterality Date  . [place] N/A 06/01/2016   Procedure: Right Heart Cath;  Surgeon: Larey Dresser, MD;  Location: [place] CV LAB;  Service: Cardiovascular;  Laterality: N/A;  . CESAREAN SECTION     placement of vagal nerve stimulator.  . CHOLECYSTECTOMY N/A 01/24/2013   Procedure: LAPAROSCOPIC CHOLECYSTECTOMY;  Surgeon: Harl Bowie, MD;  Location: [place];  Service: General;  Laterality: N/A;  . GASTRIC BYPASS  2019  . IMPLANTATION VAGAL NERVE STIMULATOR  2000,2013   battery chg-[facility]    Family History  Problem Relation Age of Onset  . Coronary artery disease Mother 52  . Other Father        killed  . Kidney disease Father   . Coronary artery disease Maternal  Grandmother   . Cancer Other   . Hypertension Other   . Stroke Other   . Breast cancer Neg Hx     Social History Social History   Tobacco Use  . Smoking status: Never Smoker  . Smokeless tobacco: Never Used  Vaping Use  . Vaping Use: Never used  Substance Use Topics  . Alcohol use: Yes    Alcohol/week: 1.0 - 3.0 standard drink    Types: 1 - 3 Glasses of wine per week    Comment: occasional  . Drug use: No    Allergies  Allergen Reactions  . Amitriptyline Swelling  . Amlodipine Swelling  . Latex Hives, Itching, Swelling and Rash    Burning also  . Amantadines Swelling  . Aspirin Nausea Only    Other reaction(s): Other (See Comments) Irritates stomach also/hx of stomach ulcers  . Simvastatin Swelling    Other reaction(s): Other (See  Comments) pain Muscle pains also  . Clindamycin Other (See Comments)    'peels skin off"  . Meloxicam Itching  . Tape Rash    Pt able to tolerate paper tape     Current Outpatient Medications  Medication Sig Dispense Refill  . acetaminophen (TYLENOL) 325 MG tablet Take 650 mg by mouth every 6 (six) hours as needed for headache.     . albuterol (PROVENTIL HFA;VENTOLIN HFA) 108 (90 Base) MCG/ACT inhaler Inhale 2 puffs into the lungs every 6 (six) hours as needed for wheezing or shortness of breath. 1 Inhaler 6  . butalbital-acetaminophen-caffeine (FIORICET) 50-325-40 MG tablet Take one tablet daily if needed for acute migraine. #10 per 30 days. No early refills. 10 tablet 0  . carvedilol (COREG) 25 MG tablet Take 1 tablet (25 mg total) by mouth 2 (two) times daily. 60 tablet 11  . clindamycin (CLINDACIN-P) 1 % SWAB Apply 1 application topically 2 (two) times daily. 60 each 5  . diclofenac Sodium (VOLTAREN) 1 % GEL APPLY 4 GRAMS TOPICALLY 4 (FOUR) TIMES DAILY. 300 g 10  . diltiazem (CARDIZEM CD) 120 MG 24 hr capsule Take 1 capsule (120 mg total) by mouth every evening. 90 capsule 3  . fexofenadine (ALLEGRA) 180 MG tablet Take 180 mg by mouth daily.    Marland Kitchen FLUoxetine (PROZAC) 20 MG capsule Take 20 mg by mouth daily. Take one time a day    . fluticasone (FLONASE) 50 MCG/ACT nasal spray Place into both nostrils daily.    Marland Kitchen gabapentin (NEURONTIN) 600 MG tablet Take 1 tablet (600 mg total) by mouth 3 (three) times daily. 90 tablet 6  . Galcanezumab-gnlm (EMGALITY) 120 MG/ML SOAJ Inject 120 mg into the skin every 30 (thirty) days. 1 mL 11  . levothyroxine (SYNTHROID) 50 MCG tablet TAKE 1 TABLET BY MOUTH EVERY DAY 90 tablet 1  . lidocaine-prilocaine (EMLA) cream APPLY 1 APPLICATION TOPICALLY AS NEEDED. 30 g 0  . lisinopril (ZESTRIL) 20 MG tablet Take 1 tablet (20 mg total) by mouth daily. 90 tablet 3  . mometasone-formoterol (DULERA) 100-5 MCG/ACT AERO Inhale 2 puffs into the lungs 2 (two) times  daily. 1 Inhaler 2  . Multiple Vitamins-Minerals (ADEKS PO) Take 1 tablet by mouth daily.    . nitroGLYCERIN (NITRODUR - DOSED IN MG/24 HR) 0.2 mg/hr patch PLACE 1 PATCH ONTO THE SKIN DAILY. 30 patch 1  . nitroGLYCERIN (NITROSTAT) 0.4 MG SL tablet PLACE 1 TAB UNDER TONGUE EVERY 5 MINUTES AS NEEDED FOR CHEST PAIN, MAX 3/15 MINS 25 tablet 6  . spironolactone (ALDACTONE) 25 MG  tablet Take 1 tablet (25 mg total) by mouth daily. 90 tablet 3  . topiramate (TOPAMAX) 200 MG tablet Take 1 tablet (200 mg total) by mouth 2 (two) times daily. 180 tablet 4  . traZODone (DESYREL) 100 MG tablet Take 100 mg by mouth at bedtime.    Marland Kitchen Ubrogepant (UBRELVY) 50 MG TABS Take 50 mg by mouth as directed. Take 1 tab at onset of migraine. May repeat in 2 hrs, if needed. Max dose: 2 tabs/day. This is a 30 day prescription. 12 tablet 11  . doxycycline (VIBRAMYCIN) 100 MG capsule Take 1 capsule (100 mg total) by mouth 2 (two) times daily. (Patient not taking: Reported on 11/02/2020) 20 capsule 0  . HYDROcodone-acetaminophen (NORCO/VICODIN) 5-325 MG tablet Take 1 tablet by mouth every 4 (four) hours as needed. (Patient not taking: Reported on 11/02/2020) 6 tablet 0  . Lidocaine (HM LIDOCAINE PATCH) 4 % PTCH Apply 1 patch topically daily. (Patient not taking: Reported on 11/02/2020) 15 patch 3   No current facility-administered medications for this visit.    Review of Systems Review of Systems  Genitourinary: Negative for menstrual problem, pelvic pain and vaginal discharge.  Skin:       Frequent abscesses    Blood pressure (!) 154/94, pulse 77, weight 176 lb 12.8 oz (80.2 kg), last menstrual period 04/13/2018.  Physical Exam Physical Exam Constitutional:      Appearance: Normal appearance.  Cardiovascular:     Rate and Rhythm: Normal rate.  Pulmonary:     Effort: Pulmonary effort is normal.  Skin:    General: Skin is warm and dry.     Comments: Scarring in axillae, boils in various stages of healing scattered,  including in vulva and inner thighs     Data Reviewed ED notes, problem list  Assessment Hydradenitis  Cutaneous abscess, unspecified site    Plan Current Meds  Medication Sig  . acetaminophen (TYLENOL) 325 MG tablet Take 650 mg by mouth every 6 (six) hours as needed for headache.   . albuterol (PROVENTIL HFA;VENTOLIN HFA) 108 (90 Base) MCG/ACT inhaler Inhale 2 puffs into the lungs every 6 (six) hours as needed for wheezing or shortness of breath.  . butalbital-acetaminophen-caffeine (FIORICET) 50-325-40 MG tablet Take one tablet daily if needed for acute migraine. #10 per 30 days. No early refills.  . carvedilol (COREG) 25 MG tablet Take 1 tablet (25 mg total) by mouth 2 (two) times daily.  . clindamycin (CLINDACIN-P) 1 % SWAB Apply 1 application topically 2 (two) times daily.  . diclofenac Sodium (VOLTAREN) 1 % GEL APPLY 4 GRAMS TOPICALLY 4 (FOUR) TIMES DAILY.  Marland Kitchen diltiazem (CARDIZEM CD) 120 MG 24 hr capsule Take 1 capsule (120 mg total) by mouth every evening.  . fexofenadine (ALLEGRA) 180 MG tablet Take 180 mg by mouth daily.  Marland Kitchen FLUoxetine (PROZAC) 20 MG capsule Take 20 mg by mouth daily. Take one time a day  . fluticasone (FLONASE) 50 MCG/ACT nasal spray Place into both nostrils daily.  Marland Kitchen gabapentin (NEURONTIN) 600 MG tablet Take 1 tablet (600 mg total) by mouth 3 (three) times daily.  . Galcanezumab-gnlm (EMGALITY) 120 MG/ML SOAJ Inject 120 mg into the skin every 30 (thirty) days.  Marland Kitchen levothyroxine (SYNTHROID) 50 MCG tablet TAKE 1 TABLET BY MOUTH EVERY DAY  . lidocaine-prilocaine (EMLA) cream APPLY 1 APPLICATION TOPICALLY AS NEEDED.  Marland Kitchen lisinopril (ZESTRIL) 20 MG tablet Take 1 tablet (20 mg total) by mouth daily.  . mometasone-formoterol (DULERA) 100-5 MCG/ACT AERO Inhale 2 puffs into  the lungs 2 (two) times daily.  . Multiple Vitamins-Minerals (ADEKS PO) Take 1 tablet by mouth daily.  . nitroGLYCERIN (NITRODUR - DOSED IN MG/24 HR) 0.2 mg/hr patch PLACE 1 PATCH ONTO THE SKIN  DAILY.  . nitroGLYCERIN (NITROSTAT) 0.4 MG SL tablet PLACE 1 TAB UNDER TONGUE EVERY 5 MINUTES AS NEEDED FOR CHEST PAIN, MAX 3/15 MINS  . spironolactone (ALDACTONE) 25 MG tablet Take 1 tablet (25 mg total) by mouth daily.  Marland Kitchen topiramate (TOPAMAX) 200 MG tablet Take 1 tablet (200 mg total) by mouth 2 (two) times daily.  . traZODone (DESYREL) 100 MG tablet Take 100 mg by mouth at bedtime.  Marland Kitchen Ubrogepant (UBRELVY) 50 MG TABS Take 50 mg by mouth as directed. Take 1 tab at onset of migraine. May repeat in 2 hrs, if needed. Max dose: 2 tabs/day. This is a 30 day prescription.   No orders of the defined types were placed in this encounter.      Emeterio Reeve 11/02/2020, 4:08 PM

## 2020-11-02 NOTE — BH Specialist Note (Signed)
Integrated Behavioral Health via Telemedicine Visit  11/02/2020 BLEN RANSOME [id number]  Number of Integrated Behavioral Health visits: 5/6 Session Start time: 3:00pm  Session End time: 3:30pm Total time: 30  Referring Provider: Dr. Cato Mulligan, MD Patient/Family location: Pt in car in private [facility] Provider location: [facility] Office All persons participating in visit: Pt & Clinician Types of Service: Individual psychotherapy  I connected with [name] and/or Naaman Plummer Territo's self via  Telephone or Geologist, engineering  (Video is Tree surgeon) and verified that I am speaking with the correct person using two identifiers. Discussed confidentiality: Yes   I discussed the limitations of telemedicine and the availability of in person appointments.  Discussed there is a possibility of technology failure and discussed alternative modes of communication if that failure occurs.  I discussed that engaging in this telemedicine visit, they consent to the provision of behavioral healthcare and the services will be billed under their insurance.  Patient and/or legal guardian expressed understanding and consented to Telemedicine visit: Yes   Presenting Concerns: Patient and/or family reports the following symptoms/concerns: elevated anx/dep  Duration of problem: past 2 wks; Severity of problem: moderate  Patient and/or Family's Strengths/Protective Factors: Social and Emotional competence, Concrete supports in place (healthy food, safe environments, etc.) and Sense of purpose  Goals Addressed: Patient will: 1.  Reduce symptoms of: anxiety, depression and stress  2.  Increase knowledge and/or ability of: coping skills, healthy habits and stress reduction  3.  Demonstrate ability to: Increase healthy adjustment to current life circumstances  Progress towards Goals: Ongoing  Interventions: Interventions utilized:  Solution-Focused Strategies and Supportive  Counseling Standardized Assessments completed: screeners prn  Patient and/or Family Response: Pt receptive to call today & requests future appt  Assessment: Patient currently experiencing elevated anx/dep due to changing life circumstances w/pending move, facing her Dtr's needs @ Sch, her job situation & psychological overwhelmed.  Patient may benefit from cont'd support & addt'l Support Grps for Parents of Special Needs Children.  Plan: 1. Follow up with behavioral health clinician on : 2-3 wks for 60 min on telehealth 2. Behavioral recommendations: [place] for a Support Grp[employer] inc self-care in all ways, even small 3. Referral(s): [place] (In Clinic)  I discussed the assessment and treatment plan with the patient and/or parent/guardian. They were provided an opportunity to ask questions and all were answered. They agreed with the plan and demonstrated an understanding of the instructions.   They were advised to call back or seek an in-person evaluation if the symptoms worsen or if the condition fails to improve as anticipated.  Donnetta Hutching, LMFT

## 2020-11-02 NOTE — Progress Notes (Signed)
Follow up ED visit 5/16 "boil" right and left groin areas.

## 2020-11-07 ENCOUNTER — Encounter: Payer: Self-pay | Admitting: *Deleted

## 2020-11-11 DIAGNOSIS — J4550 Severe persistent asthma, uncomplicated: Secondary | ICD-10-CM | POA: Diagnosis not present

## 2020-11-11 DIAGNOSIS — J4551 Severe persistent asthma with (acute) exacerbation: Secondary | ICD-10-CM | POA: Diagnosis not present

## 2020-11-11 DIAGNOSIS — L299 Pruritus, unspecified: Secondary | ICD-10-CM | POA: Diagnosis not present

## 2020-11-11 DIAGNOSIS — Z87898 Personal history of other specified conditions: Secondary | ICD-10-CM | POA: Diagnosis not present

## 2020-11-11 DIAGNOSIS — J302 Other seasonal allergic rhinitis: Secondary | ICD-10-CM | POA: Diagnosis not present

## 2020-11-16 ENCOUNTER — Ambulatory Visit: Payer: Medicare Other | Admitting: Behavioral Health

## 2020-11-16 DIAGNOSIS — F331 Major depressive disorder, recurrent, moderate: Secondary | ICD-10-CM

## 2020-11-16 DIAGNOSIS — F419 Anxiety disorder, unspecified: Secondary | ICD-10-CM

## 2020-11-16 NOTE — BH Specialist Note (Signed)
Integrated Behavioral Health via Telemedicine Visit  11/16/2020 [name] [id number]  Number of [place] visits: 6/6 Session Start time: 2:00pm  Session End time: 2:30pm Total time: 30  Referring Provider: Dr. Cato Mulligan, MD Patient/Family location: Pt @ home in private [facility] Provider location: [facility] Office All persons participating in visit: Pt & Clinician Types of Service: Individual psychotherapy  I connected with [name] and/or [name]'s  self  via  Telephone or Geologist, engineering  (Video is Tree surgeon) and verified that I am speaking with the correct person using two identifiers. Discussed confidentiality: Yes   I discussed the limitations of telemedicine and the availability of in person appointments.  Discussed there is a possibility of technology failure and discussed alternative modes of communication if that failure occurs.  I discussed that engaging in this telemedicine visit, they consent to the provision of behavioral healthcare and the services will be billed under their insurance.  Patient and/or legal guardian expressed understanding and consented to Telemedicine visit: Yes   Presenting Concerns: Patient and/or family reports the following symptoms/concerns: elevated anxiety over pending move to [place], [place] Duration of problem: Caregiving for Autistic Child now in H Sch; Severity of problem: moderate  Patient and/or Family's Strengths/Protective Factors: Social connections, Social and Emotional competence, Concrete supports in place (healthy food, safe environments, etc.), and Sense of purpose  Goals Addressed: Patient will:  Reduce symptoms of: anxiety, depression, and stress   Increase knowledge and/or ability of: coping skills and stress reduction   Demonstrate ability to: Increase healthy adjustment to current life circumstances and Increase adequate support systems for  patient/family  Progress towards Goals: Ongoing  Interventions: Interventions utilized:  Mindfulness or Relaxation Training and Supportive Counseling Standardized Assessments completed:  screeners prn  Patient and/or Family Response: Pt receptive to call & requests  Assessment: Patient currently experiencing elevated anxiety due to the circumstances of current living-Pt has moved into temporary location so the pending move in Aug to [place] can flow smoothly. Pt has contacted a H Sch recommended to her by a neighbor & she has yet to have a RTC. Pt is disappointed she does not have the info she needs.  Stressed to Pt the need for self-care practices to be scheduled into her life so she is able to function optimally to assist her Dtr. Dtr is Autistic & the past 2 days have been difficult w/her beh.  Patient may benefit from a Support Grp for Parents raising Autistic Children.  Plan: Follow up with behavioral health clinician on : Nex avail 60 min telehealth Behavioral recommendations: Find a Support Grp; ask your new neighbor Referral(s): [place] (In Clinic) and Support Grp for Parents of Autistic Teens (possibly through the [place] )  I discussed the assessment and treatment plan with the patient and/or parent/guardian. They were provided an opportunity to ask questions and all were answered. They agreed with the plan and demonstrated an understanding of the instructions.   They were advised to call back or seek an in-person evaluation if the symptoms worsen or if the condition fails to improve as anticipated.  Donnetta Hutching, LMFT

## 2020-11-24 DIAGNOSIS — G4733 Obstructive sleep apnea (adult) (pediatric): Secondary | ICD-10-CM | POA: Diagnosis not present

## 2020-11-29 NOTE — Progress Notes (Signed)
[facility]  Cardiology Office Note:    Date:  12/01/2020   ID:  [name], DOB Apr 30, 1966, MRN [id number]  PCP:  [name], [facility]  Cardiologist:  Ena Dawley, MD (Inactive)  Advanced Practice Provider:  No care team member to display Electrophysiologist:  None   Referring MD: Abigail Mulligan, MD    History of Present Illness:    [name] is a 55 y.o. female with a hx of anxiety, chronic chest pain, seizures, morbid obesity s/p gastric bypass, and GERD who was previously followed by Dr. Meda Coffee now presenting for follow-up  Patient was previously followed by Dr. [name] and Dr. Meda Coffee and has been extensively evaluated for chest pain. Specifically, had abnormal stress test in 2008 that was followed by normal cardiac catheterization. Coronary CT angiogram (04/2011): calcium score 0, difficult study with gating artifact, mid RCA nonevaluatable but no stenosis elsewhere in the coronaries. Echo (11/12): EF 55-60%, mild LV hypertrophy, mild MR.  NSTEMI in [place] in 5/13 with elevated troponin but LHC showed normal coronaries.   Last saw Dr. Meda Coffee on 10/08/19 where she was doing very well. She underwent bariatric surgery and lost 140lbs.She was focusing on healthy diet and exercise but noted to labile blood pressures at that time.  Last seen in clinic by me on 08/23/20 where she was having residual chest discomfort and fatigue after recent COVID infection.  Also was seen for cardiac clearance prior to vagus nerve stimulator removal. Given symptoms, we obtained a Myoview 09/01/20, which showed normal perfusion with LVEF 52% and TTE 08/30/20 which showed normal LVEF70-75%, no WMA, normal RV, mild AI.  Today, the patient overall feels well. She has been working to keep the weight off after gastric bypass, but has struggling with depression which has made it difficult to adhere to her diet. The surgery  for her vagus nerve stimulator replacement went well. Her shortness of breath and chest discomfort are improved.She is wanting to turn on the nodal acting portion of her vagal nerve stimulator but was told her nodal agents need to be adjusted. She is wondering if we can do this today.  Past Medical History:  Diagnosis Date   Anxiety    Asthma    Biliary dyskinesia    a. s/p cholecystectomy.   BMI 40.0-44.9, adult ([place]) 06/11/2015   Chronic chest pain    ?Microvascular angina vs spasm - a. Abnl stress [place] 2008, f/u cath reportedly nl. b. ETT-Myoview 04/2011 - EKG changes but normal perfusion. Cor CT - no coronary calcium, no definite stenosis though mRCA not fully evaluated. c. 10/2011 - tn elevated in [place], LHC without CAD. Started on Ranexa, anti-anginals ?microvascular dz but later stopped while in hospital on abx.   Complication of anesthesia    hard to wake up-had to be reminded to breath   Diarrhea 08/09/2017   GERD (gastroesophageal reflux disease)    a. Severe.   History of seizure 10/19/2017   HTN (hypertension)    Hx of cardiovascular stress test    [place] Myoview 8/14:  Normal, EF 74%   Hx of echocardiogram    Echo 3/16:  Mild LVH, EF 55-60%, Gr 1 DD, trivial MR, mild LAE, normal RVF   Hydradenitis 11/09/2016   Hypothyroid    Hypothyroidism 07/02/2013   Overview:  Last Assessment & Plan:   She reports a 21 pound increase in her weight since October. She doesn't  know when the last time her TSH was checked so we will do that today.   Low back pain 04/19/2016   MI (myocardial infarction) [facility])    Migraine    Morbid obesity ([place]) 07/26/2017   MRSA infection    a. After vagal nerve stimulator at [facility] - surgical site MRSA infection, PICC placed.   Obesity    OSA (obstructive sleep apnea) 07/26/2017   uses CPAP nightly   Palpitations    a. 08/2014: 48 hour holter with 2 PVCs otherwise normal.   Pre-diabetes    Rectal pain 12/19/2017   Seizure disorder ([place])    a. since  childhood. b. s/p vagal nerve stimulator at [facility].   Seizures [facility])     Past Surgical History:  Procedure Laterality Date   [place] N/A 06/01/2016   Procedure: Right Heart Cath;  Surgeon: Larey Dresser, MD;  Location: [facility] [facility];  Service: Cardiovascular;  Laterality: N/A;   CESAREAN SECTION     placement of vagal nerve stimulator.   CHOLECYSTECTOMY N/A 01/24/2013   Procedure: LAPAROSCOPIC CHOLECYSTECTOMY;  Surgeon: Harl Bowie, MD;  Location: No Name;  Service: General;  Laterality: N/A;   GASTRIC BYPASS  2019   IMPLANTATION VAGAL NERVE STIMULATOR  2000,2013   battery chg-[facility]    Current Medications: Current Meds  Medication Sig   acetaminophen (TYLENOL) 325 MG tablet Take 650 mg by mouth every 6 (six) hours as needed for headache.    albuterol (PROVENTIL HFA;VENTOLIN HFA) 108 (90 Base) MCG/ACT inhaler Inhale 2 puffs into the lungs every 6 (six) hours as needed for wheezing or shortness of breath.   amoxicillin-clavulanate (AUGMENTIN) 875-125 MG tablet Take 1 tablet by mouth 2 (two) times daily.   butalbital-acetaminophen-caffeine (FIORICET) 50-325-40 MG tablet Take one tablet daily if needed for acute migraine. #10 per 30 days. No early refills.   carvedilol (COREG) 25 MG tablet Take 1 tablet (25 mg total) by mouth 2 (two) times daily.   clindamycin (CLINDACIN-P) 1 % SWAB Apply 1 application topically 2 (two) times daily.   diclofenac Sodium (VOLTAREN) 1 % GEL APPLY 4 GRAMS TOPICALLY 4 (FOUR) TIMES DAILY.   fexofenadine (ALLEGRA) 180 MG tablet Take 180 mg by mouth daily.   FLUoxetine (PROZAC) 20 MG capsule Take 20 mg by mouth daily. Take one time a day   fluticasone (FLONASE) 50 MCG/ACT nasal spray Place into both nostrils daily.   gabapentin (NEURONTIN) 600 MG tablet Take 1 tablet (600 mg total) by mouth 3 (three) times daily.   Galcanezumab-gnlm (EMGALITY) 120 MG/ML SOAJ  Inject 120 mg into the skin every 30 (thirty) days.   levothyroxine (SYNTHROID) 50 MCG tablet TAKE 1 TABLET BY MOUTH EVERY DAY   Lidocaine (HM LIDOCAINE PATCH) 4 % PTCH Apply 1 patch topically daily.   lidocaine-prilocaine (EMLA) cream APPLY 1 APPLICATION TOPICALLY AS NEEDED.   lisinopril (ZESTRIL) 10 MG tablet Take 3 tablets (30 mg total) by mouth daily.   mometasone-formoterol (DULERA) 100-5 MCG/ACT AERO Inhale 2 puffs into the lungs 2 (two) times daily.   Multiple Vitamins-Minerals (ADEKS PO) Take 1 tablet by mouth daily.   spironolactone (ALDACTONE) 25 MG tablet Take 1 tablet (25 mg total) by mouth daily.   topiramate (TOPAMAX) 200 MG tablet Take 1 tablet (200 mg total) by mouth 2 (two) times daily.   traZODone (DESYREL) 100 MG tablet Take 100 mg by mouth at bedtime.  Ubrogepant (UBRELVY) 50 MG TABS Take 50 mg by mouth as directed. Take 1 tab at onset of migraine. May repeat in 2 hrs, if needed. Max dose: 2 tabs/day. This is a 30 day prescription.   [DISCONTINUED] diltiazem (CARDIZEM CD) 120 MG 24 hr capsule Take 1 capsule (120 mg total) by mouth every evening.   [DISCONTINUED] nitroGLYCERIN (NITRODUR - DOSED IN MG/24 HR) 0.2 mg/hr patch PLACE 1 PATCH ONTO THE SKIN DAILY.   [DISCONTINUED] nitroGLYCERIN (NITROSTAT) 0.4 MG SL tablet PLACE 1 TAB UNDER TONGUE EVERY 5 MINUTES AS NEEDED FOR CHEST PAIN, MAX 3/15 MINS     Allergies:   Amitriptyline, Amlodipine, Latex, Amantadines, Aspirin, Simvastatin, Clindamycin, Meloxicam, and Tape   Social History   Socioeconomic History   Marital status: Married    Spouse name: Herbie [place]   Number of children: 2   Years of education: college   Highest education level: Associate degree: academic program  Occupational History   Occupation: Disabled  Tobacco Use   Smoking status: Never   Smokeless tobacco: Never  Vaping Use   Vaping Use: Never used  Substance and Sexual Activity   Alcohol use: Yes    Alcohol/week: 1.0 - 3.0 standard drink    Types: 1 -  3 Glasses of wine per week    Comment: occasional   Drug use: No   Sexual activity: Not on file  Other Topics Concern   Not on file  Social History Narrative   Lives at home with her daughter.   Right-handed.   No caffeine per day.   Social Determinants of Health   Financial Resource Strain: Not on file  Food Insecurity: Not on file  Transportation Needs: Not on file  Physical Activity: Not on file  Stress: Not on file  Social Connections: Not on file     Family History: The patient's family history includes Cancer in an other family member; Coronary artery disease in her maternal grandmother; Coronary artery disease (age of onset: 5) in her mother; Hypertension in an other family member; Kidney disease in her father; Other in her father; Stroke in an other family member. There is no history of Breast cancer.  ROS:   Please see the history of present illness.    Review of Systems  Constitutional:  Negative for chills, fever and malaise/fatigue.  HENT:  Negative for hearing loss.   Eyes:  Negative for blurred vision.  Respiratory:  Positive for shortness of breath.   Cardiovascular:  Negative for chest pain, palpitations, orthopnea, claudication, leg swelling and PND.  Gastrointestinal:  Negative for nausea and vomiting.  Genitourinary:  Negative for dysuria.  Musculoskeletal:  Positive for joint pain and myalgias.  Neurological:  Negative for dizziness and loss of consciousness.  Endo/Heme/Allergies:  Negative for polydipsia.  Psychiatric/Behavioral:  Negative for substance abuse.    EKGs/Labs/Other Studies Reviewed:    The following studies were reviewed today: Myoview 09/01/20: Nuclear stress EF: 52%. No wall motion abnormalities There was no ST segment deviation noted during stress. The study is normal. This is a low risk study. No perfusion evidence of infarct or ischemia.  TTE 08/30/20: IMPRESSIONS   1. Left ventricular ejection fraction, by estimation, is 70 to  75%. The  left ventricle has hyperdynamic function. The left ventricle has no  regional wall motion abnormalities. Left ventricular diastolic parameters  were normal.   2. Right ventricular systolic function is normal. The right ventricular  size is normal.   3. The mitral valve is normal in  structure. Trivial mitral valve  regurgitation.   4. The aortic valve is tricuspid. Aortic valve regurgitation is mild.  Mild aortic valve sclerosis is present, with no evidence of aortic valve  stenosis.   5. The inferior vena cava is dilated in size with >50% respiratory  variability, suggesting right atrial pressure of 8 mmHg.   [place] 05/2016 Procedures    Right Heart Cath  Conclusion    Normal right and left heart filling pressures, no pulmonary hypertension.  Normal cardiac output.    No changes.       NST 07/2018 Study Highlights    The left ventricular ejection fraction is mildly decreased (45-54%). Nuclear stress EF: 50%. The study is normal. This is a low risk study.    2D Echo 07/17/18  1. The left ventricle has low normal systolic function, with an ejection fraction of 50-55%. The cavity size was mildly dilated. Left ventricular diastolic Doppler parameters are consistent with impaired relaxation.  2. The right ventricle has normal systolic function. The cavity was normal. There is no increase in right ventricular wall thickness.  3. The mitral valve is normal in structure.  4. The tricuspid valve is normal in structure.  5. The aortic valve is normal in structure.  6. The pulmonic valve was normal in structure.  7. Right atrial pressure is estimated at 3 mmHg.    EKG:  No new tracing  Recent Labs: No results found for requested labs within last 8760 hours.  Recent Lipid Panel    Component Value Date/Time   CHOL 200 (H) 05/21/2017 1111   TRIG 58 05/21/2017 1111   HDL 59 05/21/2017 1111   CHOLHDL 3.4 05/21/2017 1111   CHOLHDL 3 09/30/2012 0929   VLDL 11.2 09/30/2012  0929   LDLCALC 129 (H) 05/21/2017 1111     Risk Assessment/Calculations:       Physical Exam:    VS:  BP 136/78   Pulse 80   Ht 5' 5.5" (1.664 m)   Wt 175 lb 6.4 oz (79.6 kg)   LMP 04/13/2018   SpO2 98%   BMI 28.74 kg/m     Wt Readings from Last 3 Encounters:  12/01/20 175 lb 6.4 oz (79.6 kg)  11/02/20 176 lb 12.8 oz (80.2 kg)  10/26/20 175 lb (79.4 kg)     GEN:  Well nourished, well developed in no acute distress HEENT: Normal NECK: No JVD; No carotid bruits CARDIAC: RRR, 2/6 systolic murmur, no rubs or gallops RESPIRATORY:  CTAB, no wheezing ABDOMEN: Soft, non-tender, non-distended MUSCULOSKELETAL:  Warm, no edema SKIN: Warm and dry NEUROLOGIC:  Alert and oriented x 3 PSYCHIATRIC:  Normal affect   ASSESSMENT:    1. Dyspnea on exertion   2. Chronic chest pain   3. Chronic diastolic (congestive) heart failure ([place])   4. Essential hypertension   5. Dyslipidemia, goal LDL below 70   6. Medication management   7. Status post VNS (vagus nerve stimulator) placement   8. OSA (obstructive sleep apnea)   9. Post-COVID syndrome    PLAN:    In order of problems listed above:  #DOE: #Prolonged Post-COVID Syndrome: Improved. Patient had episodes of chest pressure and SOB following COVID in 06/2020. Myoview 08/2020 negative for ischemia. Cath 2017 negative for obstructive disease. TTE 08/2020 with normal LVEF 70-75%, normal RV, mild AI.  -Continue symptomatic management  #Mild AI: TTE 08/2020 with mild aortic sclerosis with mild AI -Continue serial monitoring  #HTN: Well controlled at home. -Continue coreg  25mg  BID; can adjust with nodal agents further if needed with vagal nerve stimulator -Increase lisinopril to 30mg  daily -Continue spironolactone 25mg  daily -Stop dilt due to wanting to start vagal nerve stimulator -Repeat BMET in 1 week  #Diastolic Dysfunction: Euvolemic on exam. TTE with LVEF 70-75%. -Manage blood pressure as above -Low Na  diet  #OSA: -On home CPAP  #Obesity: S/p gastric bypass. -Continue healthy diet and lifestyle modifications  #HLD: Last LDL 129 in 2018. -Check lipids next week   Medication Adjustments/Labs and Tests Ordered: Current medicines are reviewed at length with the patient today.  Concerns regarding medicines are outlined above.  Orders Placed This Encounter  Procedures   Basic Metabolic Panel (BMET)   TSH   Lipid Profile   Meds ordered this encounter  Medications   nitroGLYCERIN (NITROSTAT) 0.4 MG SL tablet    Sig: PLACE 1 TAB UNDER TONGUE EVERY 5 MINUTES AS NEEDED FOR CHEST PAIN, MAX 3/15 MINS    Dispense:  25 tablet    Refill:  6   nitroGLYCERIN (NITRODUR - DOSED IN MG/24 HR) 0.2 mg/hr patch    Sig: PLACE 1 PATCH ONTO THE SKIN DAILY.    Dispense:  30 patch    Refill:  1   lisinopril (ZESTRIL) 10 MG tablet    Sig: Take 3 tablets (30 mg total) by mouth daily.    Dispense:  270 tablet    Refill:  1    DOSE INCREASE    Patient Instructions  Medication Instructions:   STOP TAKING DILTIAZEM NOW  INCREASE YOUR LISINOPRIL TO 30 MG BY MOUTH DAILY  *If you need a refill on your cardiac medications before your next appointment, please call your pharmacy*   Lab Work:  [place] BMET, TSH, LIPIDS--PLEASE COME FASTING TO THIS LAB APPOINTMENT  If you have labs (blood work) drawn today and your tests are completely normal, you will receive your results only by: [place] (if you have MyChart) OR A paper copy in the mail If you have any lab test that is abnormal or we need to change your treatment, we will call you to review the results.   Follow-Up: At [facility], you and your health needs are our priority.  As part of our continuing mission to provide you with exceptional heart care, we have created designated Provider Care Teams.  These Care Teams include your primary Cardiologist (physician) and Advanced Practice Providers (APPs -   Physician Assistants and Nurse Practitioners) who all work together to provide you with the care you need, when you need it.  We recommend signing up for the patient portal called "MyChart".  Sign up information is provided on this After Visit Summary.  MyChart is used to connect with patients for Virtual Visits (Telemedicine).  Patients are able to view lab/test results, encounter notes, upcoming appointments, etc.  Non-urgent messages can be sent to your provider as well.   To learn more about what you can do with MyChart, go to [email or web address].    Your next appointment:   6 month(s)  The format for your next appointment:   In Person  Provider:   Gwyndolyn Kaufman, MD      Signed, Freada Bergeron, MD  12/01/2020 1:02 PM    [place]

## 2020-12-01 ENCOUNTER — Other Ambulatory Visit: Payer: Self-pay

## 2020-12-01 ENCOUNTER — Ambulatory Visit (INDEPENDENT_AMBULATORY_CARE_PROVIDER_SITE_OTHER): Payer: Medicare Other | Admitting: Cardiology

## 2020-12-01 ENCOUNTER — Encounter (HOSPITAL_BASED_OUTPATIENT_CLINIC_OR_DEPARTMENT_OTHER): Payer: Self-pay | Admitting: Cardiology

## 2020-12-01 VITALS — BP 136/78 | HR 80 | Ht 65.5 in | Wt 175.4 lb

## 2020-12-01 DIAGNOSIS — R0609 Other forms of dyspnea: Secondary | ICD-10-CM

## 2020-12-01 DIAGNOSIS — R0600 Dyspnea, unspecified: Secondary | ICD-10-CM

## 2020-12-01 DIAGNOSIS — U099 Post covid-19 condition, unspecified: Secondary | ICD-10-CM

## 2020-12-01 DIAGNOSIS — I10 Essential (primary) hypertension: Secondary | ICD-10-CM

## 2020-12-01 DIAGNOSIS — Z9689 Presence of other specified functional implants: Secondary | ICD-10-CM

## 2020-12-01 DIAGNOSIS — I5032 Chronic diastolic (congestive) heart failure: Secondary | ICD-10-CM | POA: Diagnosis not present

## 2020-12-01 DIAGNOSIS — R079 Chest pain, unspecified: Secondary | ICD-10-CM

## 2020-12-01 DIAGNOSIS — G4733 Obstructive sleep apnea (adult) (pediatric): Secondary | ICD-10-CM

## 2020-12-01 DIAGNOSIS — G8929 Other chronic pain: Secondary | ICD-10-CM

## 2020-12-01 DIAGNOSIS — Z79899 Other long term (current) drug therapy: Secondary | ICD-10-CM

## 2020-12-01 DIAGNOSIS — E785 Hyperlipidemia, unspecified: Secondary | ICD-10-CM

## 2020-12-01 MED ORDER — LISINOPRIL 10 MG PO TABS: 30.0000 mg | ORAL_TABLET | Freq: Every day | ORAL | 1 refills | Status: DC

## 2020-12-01 MED ORDER — NITROGLYCERIN 0.2 MG/HR TD PT24: MEDICATED_PATCH | TRANSDERMAL | 1 refills | Status: DC

## 2020-12-01 MED ORDER — NITROGLYCERIN 0.4 MG SL SUBL: SUBLINGUAL_TABLET | SUBLINGUAL | 6 refills | Status: DC

## 2020-12-01 NOTE — Patient Instructions (Signed)
Medication Instructions:   STOP TAKING DILTIAZEM NOW  INCREASE YOUR LISINOPRIL TO 30 MG BY MOUTH DAILY  *If you need a refill on your cardiac medications before your next appointment, please call your pharmacy*   Lab Work:  [place] BMET, TSH, LIPIDS--PLEASE COME FASTING TO THIS LAB APPOINTMENT  If you have labs (blood work) drawn today and your tests are completely normal, you will receive your results only by: [place] (if you have MyChart) OR A paper copy in the mail If you have any lab test that is abnormal or we need to change your treatment, we will call you to review the results.   Follow-Up: At [facility], you and your health needs are our priority.  As part of our continuing mission to provide you with exceptional heart care, we have created designated Provider Care Teams.  These Care Teams include your primary Cardiologist (physician) and Advanced Practice Providers (APPs -  Physician Assistants and Nurse Practitioners) who all work together to provide you with the care you need, when you need it.  We recommend signing up for the patient portal called "MyChart".  Sign up information is provided on this After Visit Summary.  MyChart is used to connect with patients for Virtual Visits (Telemedicine).  Patients are able to view lab/test results, encounter notes, upcoming appointments, etc.  Non-urgent messages can be sent to your provider as well.   To learn more about what you can do with MyChart, go to [email or web address].    Your next appointment:   6 month(s)  The format for your next appointment:   In Person  Provider:   Gwyndolyn Kaufman, MD

## 2020-12-02 ENCOUNTER — Telehealth: Payer: Self-pay | Admitting: *Deleted

## 2020-12-02 NOTE — Telephone Encounter (Signed)
PA for Ubrelvy 50mg  started on covermymeds (key: [id number]). Pt has pharmacy coverage through [facility] [phone]). Decision pending.

## 2020-12-03 NOTE — Telephone Encounter (Signed)
PA approved through 11/22/2021.

## 2020-12-07 ENCOUNTER — Ambulatory Visit: Payer: Medicare Other | Admitting: Behavioral Health

## 2020-12-07 ENCOUNTER — Encounter: Payer: Self-pay | Admitting: *Deleted

## 2020-12-07 DIAGNOSIS — F331 Major depressive disorder, recurrent, moderate: Secondary | ICD-10-CM

## 2020-12-07 DIAGNOSIS — F419 Anxiety disorder, unspecified: Secondary | ICD-10-CM

## 2020-12-07 NOTE — BH Specialist Note (Signed)
Integrated Behavioral Health via Telemedicine Visit  12/07/2020 SHALANA JARDIN [id number]  Number of Integrated Behavioral Health visits: 7 Session Start time: 1:00pm  Session End time: 1:30pm Total time: 30  Referring Provider: Dr. Cato Mulligan, MD Patient/Family location: Pt is in private [facility] Provider location: [facility] OF[facility]ice All persons participating in visit: Pt & Clinician Types of Service: Individual psychotherapy  I connected with [name] and/or Naaman Plummer Blackburn's  self  via  Telephone or Geologist, engineering  (Video is Tree surgeon) and verified that I am speaking with the correct person using two identifiers. Discussed confidentiality: Yes   I discussed the limitations of telemedicine and the availability of in person appointments.  Discussed there is a possibility of technology failure and discussed alternative modes of communication if that failure occurs.  I discussed that engaging in this telemedicine visit, they consent to the provision of behavioral healthcare and the services will be billed under their insurance.  Patient and/or legal guardian expressed understanding and consented to Telemedicine visit: Yes   Presenting Concerns: Patient and/or family reports the following symptoms/concerns: elevated anx due to Family member who kept nagging her about her contribution to situation Duration of problem: one week; Severity of problem: moderate  Patient and/or Family's Strengths/Protective Factors: Social and Emotional competence, Concrete supports in place (healthy food, safe environments, etc.), and Sense of purpose  Goals Addressed: Patient will:  Reduce symptoms of: anxiety, depression, and stress   Increase knowledge and/or ability of: coping skills and stress reduction   Demonstrate ability to: Increase healthy adjustment to current life circumstances  Progress towards Goals: Ongoing  Interventions: Interventions utilized:   Solution-Focused Strategies, Behavioral Activation, and Supportive Counseling Standardized Assessments completed:  screeners prn  Patient and/or Family Response: Pt receptive to call today & requests future appt  Assessment: Patient currently experiencing elevated anx due to Family gatherings that have been difficult.   Patient may benefit from cont'd support calls to minimize stressor load.  Plan: Follow up with behavioral health clinician on : 2-3 wks on telehealth for 30 min Behavioral recommendations: cont to keep boundaries, say No!, mind your own schedule, & get rest Referral(s): [place] (In Clinic)  I discussed the assessment and treatment plan with the patient and/or parent/guardian. They were provided an opportunity to ask questions and all were answered. They agreed with the plan and demonstrated an understanding of the instructions.   They were advised to call back or seek an in-person evaluation if the symptoms worsen or if the condition fails to improve as anticipated.  Donnetta Hutching, LMFT

## 2020-12-09 ENCOUNTER — Other Ambulatory Visit: Payer: Self-pay

## 2020-12-09 ENCOUNTER — Other Ambulatory Visit: Payer: Medicare Other

## 2020-12-09 DIAGNOSIS — I5032 Chronic diastolic (congestive) heart failure: Secondary | ICD-10-CM

## 2020-12-09 DIAGNOSIS — R079 Chest pain, unspecified: Secondary | ICD-10-CM | POA: Diagnosis not present

## 2020-12-09 DIAGNOSIS — Z79899 Other long term (current) drug therapy: Secondary | ICD-10-CM

## 2020-12-09 DIAGNOSIS — Z9689 Presence of other specified functional implants: Secondary | ICD-10-CM

## 2020-12-09 DIAGNOSIS — G8929 Other chronic pain: Secondary | ICD-10-CM

## 2020-12-09 DIAGNOSIS — E785 Hyperlipidemia, unspecified: Secondary | ICD-10-CM

## 2020-12-09 DIAGNOSIS — I10 Essential (primary) hypertension: Secondary | ICD-10-CM | POA: Diagnosis not present

## 2020-12-09 LAB — BASIC METABOLIC PANEL
BUN/Creatinine Ratio: 24 — ABNORMAL HIGH (ref 9–23)
BUN: 15 mg/dL (ref 6–24)
CO2: 22 mmol/L (ref 20–29)
Calcium: 9.1 mg/dL (ref 8.7–10.2)
Chloride: 105 mmol/L (ref 96–106)
Creatinine, Ser: 0.63 mg/dL (ref 0.57–1.00)
Glucose: 95 mg/dL (ref 65–99)
Potassium: 4.2 mmol/L (ref 3.5–5.2)
Sodium: 141 mmol/L (ref 134–144)
eGFR: 104 mL/min/{1.73_m2} (ref >59)

## 2020-12-09 LAB — LIPID PANEL
Chol/HDL Ratio: 1.7 ratio (ref 0.0–4.4)
Cholesterol, Total: 150 mg/dL (ref 100–199)
HDL: 90 mg/dL (ref >39)
LDL Chol Calc (NIH): 48 mg/dL (ref 0–99)
Triglycerides: 54 mg/dL (ref 0–149)
VLDL Cholesterol Cal: 12 mg/dL (ref 5–40)

## 2020-12-09 LAB — TSH: TSH: 0.663 u[IU]/mL (ref 0.450–4.500)

## 2020-12-10 ENCOUNTER — Other Ambulatory Visit: Payer: Self-pay | Admitting: Neurology

## 2020-12-10 ENCOUNTER — Other Ambulatory Visit: Payer: Self-pay | Admitting: Internal Medicine

## 2020-12-10 DIAGNOSIS — M25531 Pain in right wrist: Secondary | ICD-10-CM

## 2020-12-10 DIAGNOSIS — M25532 Pain in left wrist: Secondary | ICD-10-CM

## 2020-12-10 DIAGNOSIS — M79642 Pain in left hand: Secondary | ICD-10-CM

## 2020-12-10 DIAGNOSIS — M79641 Pain in right hand: Secondary | ICD-10-CM

## 2020-12-11 DIAGNOSIS — Z20822 Contact with and (suspected) exposure to covid-19: Secondary | ICD-10-CM | POA: Diagnosis not present

## 2020-12-13 ENCOUNTER — Telehealth: Payer: Self-pay | Admitting: Neurology

## 2020-12-13 ENCOUNTER — Telehealth: Payer: Self-pay

## 2020-12-13 DIAGNOSIS — I252 Old myocardial infarction: Secondary | ICD-10-CM | POA: Diagnosis not present

## 2020-12-13 DIAGNOSIS — R110 Nausea: Secondary | ICD-10-CM | POA: Diagnosis not present

## 2020-12-13 DIAGNOSIS — R0602 Shortness of breath: Secondary | ICD-10-CM | POA: Diagnosis not present

## 2020-12-13 DIAGNOSIS — I509 Heart failure, unspecified: Secondary | ICD-10-CM | POA: Diagnosis not present

## 2020-12-13 DIAGNOSIS — R079 Chest pain, unspecified: Secondary | ICD-10-CM | POA: Diagnosis not present

## 2020-12-13 NOTE — Telephone Encounter (Signed)
Pt questing a call back states that he Vagus Nerve simulator for her heart needs to be switched on. Please advise.

## 2020-12-13 NOTE — Telephone Encounter (Signed)
RTC, Patient states she tested positive for covid on Friday 12/10/20, became symptomatic on 7/6 or 7/7. She c/o coughing, body aches, chills, congestion.  She also states she is SOB at rest and started having intermittent chest pain w/ right arm numbness.  Pt advised, per [facility] policy, to seek evaluation and treatment in the ED d/t SOB at rest and the chest pain.  Pt has a hx of chronic diastolic heart failure.  She was instructed to wear a mask and to continue wearing it for 5 days and while symptomatic.  She verbalized understanding.    She is asking about antiviral or antibody treatments, reiterated to patient to go to the ED for evaluation first.  Will forward to PCP to advise. SChaplin, RN,BSN

## 2020-12-13 NOTE — Telephone Encounter (Signed)
Requesting to speak with a nurse about positive test result for COVID. Please call back

## 2020-12-13 NOTE — Telephone Encounter (Signed)
I spoke to the patient. She is no longer on an oral beta blocker and needs an appt to have her VNS adjusted. She tested positive for Covid 4 days ago. She canceled her appt this week. She has been rescheduled to 12/21/20. She will call back, if her Covid symptoms have not resolved so we can look for another time.

## 2020-12-14 ENCOUNTER — Ambulatory Visit: Payer: Medicare Other | Admitting: Neurology

## 2020-12-14 DIAGNOSIS — R079 Chest pain, unspecified: Secondary | ICD-10-CM | POA: Diagnosis not present

## 2020-12-14 DIAGNOSIS — U071 COVID-19: Secondary | ICD-10-CM | POA: Diagnosis not present

## 2020-12-14 NOTE — Telephone Encounter (Signed)
Call from pt stating she's not feeling better. I asked pt if she went to the ED as suggested yesterday when she called; stated she did go and waited as long as she could. She stated it was a 10 hr wait and she had her daughter with her,who's special needs,and she had to leave b/c of her daughter. Stated they did draw blood and took an x-ray. I asked which hospital - stated she went to [facility] ED which is closer to her home. No appts today; telehealth available tomorrow am. Instructed pt to go back to the ED; stated she cannot b/c of her daughter. Informed pt, we will schedule telehealth appt for tomorrow 7/13 @ 0945 AM but if she continues to feel bad and her symptoms worsen to go to an UC. Stated she will probably end up going to UC but will keep tomorrow's appt if needed.

## 2020-12-15 ENCOUNTER — Ambulatory Visit (INDEPENDENT_AMBULATORY_CARE_PROVIDER_SITE_OTHER): Payer: Medicare Other | Admitting: Internal Medicine

## 2020-12-15 DIAGNOSIS — U071 COVID-19: Secondary | ICD-10-CM

## 2020-12-21 ENCOUNTER — Encounter: Payer: Self-pay | Admitting: Neurology

## 2020-12-21 ENCOUNTER — Ambulatory Visit (INDEPENDENT_AMBULATORY_CARE_PROVIDER_SITE_OTHER): Payer: Medicare Other | Admitting: Neurology

## 2020-12-21 ENCOUNTER — Telehealth: Payer: Self-pay | Admitting: *Deleted

## 2020-12-21 VITALS — BP 114/76 | HR 72 | Ht 65.5 in | Wt 177.5 lb

## 2020-12-21 DIAGNOSIS — G4440 Drug-induced headache, not elsewhere classified, not intractable: Secondary | ICD-10-CM

## 2020-12-21 DIAGNOSIS — Z9689 Presence of other specified functional implants: Secondary | ICD-10-CM | POA: Diagnosis not present

## 2020-12-21 DIAGNOSIS — R569 Unspecified convulsions: Secondary | ICD-10-CM | POA: Diagnosis not present

## 2020-12-21 DIAGNOSIS — G43709 Chronic migraine without aura, not intractable, without status migrainosus: Secondary | ICD-10-CM | POA: Diagnosis not present

## 2020-12-21 DIAGNOSIS — T3995XA Adverse effect of unspecified nonopioid analgesic, antipyretic and antirheumatic, initial encounter: Secondary | ICD-10-CM

## 2020-12-21 DIAGNOSIS — U071 COVID-19: Secondary | ICD-10-CM | POA: Insufficient documentation

## 2020-12-21 MED ORDER — EMGALITY 120 MG/ML ~~LOC~~ SOAJ: 120.0000 mg | SUBCUTANEOUS | 11 refills | Status: DC

## 2020-12-21 NOTE — Telephone Encounter (Signed)
Approved through 12/21/2021.

## 2020-12-21 NOTE — Assessment & Plan Note (Signed)
Assessment: Patient presented via telephone for covid symptoms. She tested positive 12/11/20 and had symptoms since 12/09/20. Patient had diarrhea that resolved but continues to have congestion, SOB, body aches, right sided chest pain, cough and decreased taste. She stated she was in bed for a full day on 12/12/20 and symptom severity has been decreasing. Patient has 2 doses of Pfizer (07/28/19 and 07/30/20) and she was covid positive in January with less symptom severity. This time the symptoms are more severe. She went to ED but had to leave early due to personal reason and the next day she went to an urgent care where she was advised conservative treatment. She still had symptoms, so she called [facility] to get guidance. After speaking with Dr. Jimmye Norman, I advise her that her infection has past the worst phase and she is the recovery phase. She inquired about antiviral drugs but we told her data regarding them are not strong for COVID vaccinated patients. Advised her to continue quarantine, continue using conservative management that she was advised at the urgent care including adequate hydration, tylenol and motrin for pain relieve and robitussin for cough. Also advised her if her symptoms continue to get worse, seek medical care immediately.  Plan: -Follow conservative measures of hydration, pain relieve, and cough suppression -Medical care advised if symptoms worsen

## 2020-12-21 NOTE — Progress Notes (Signed)
  [facility] Internal Medicine Residency Telephone Encounter Continuity Care Appointment  HPI:  This telephone encounter was created for [name] on  12/15/2020 for the following purpose COVID symptoms.   Past Medical History:  Past Medical History:  Diagnosis Date   Anxiety    Asthma    Biliary dyskinesia    a. s/p cholecystectomy.   BMI 40.0-44.9, adult ([place]) 06/11/2015   Chronic chest pain    ?Microvascular angina vs spasm - a. Abnl stress [place] 2008, f/u cath reportedly nl. b. ETT-Myoview 04/2011 - EKG changes but normal perfusion. Cor CT - no coronary calcium, no definite stenosis though mRCA not fully evaluated. c. 10/2011 - tn elevated in [place], LHC without CAD. Started on Ranexa, anti-anginals ?microvascular dz but later stopped while in hospital on abx.   Complication of anesthesia    hard to wake up-had to be reminded to breath   Diarrhea 08/09/2017   GERD (gastroesophageal reflux disease)    a. Severe.   History of seizure 10/19/2017   HTN (hypertension)    Hx of cardiovascular stress test    Lex Myoview 8/14:  Normal, EF 74%   Hx of echocardiogram    Echo 3/16:  Mild LVH, EF 55-60%, Gr 1 DD, trivial MR, mild LAE, normal RVF   Hydradenitis 11/09/2016   Hypothyroid    Hypothyroidism 07/02/2013   Overview:  Last Assessment & Plan:   She reports a 21 pound increase in her weight since October. She doesn't know when the last time her TSH was checked so we will do that today.   Low back pain 04/19/2016   MI (myocardial infarction) [facility])    Migraine    Morbid obesity ([place]) 07/26/2017   MRSA infection    a. After vagal nerve stimulator at [facility] - surgical site MRSA infection, PICC placed.   Obesity    OSA (obstructive sleep apnea) 07/26/2017   uses CPAP nightly   Palpitations    a. 08/2014: 48 hour holter with 2 PVCs otherwise normal.   Pre-diabetes    Rectal pain 12/19/2017   Seizure disorder ([place])    a. since childhood. b. s/p vagal nerve stimulator at [facility].    Seizures ([place])      ROS:  Positive for congestion, SOB, body ache, right sided chest pain, cough, and decreased taste   Assessment / Plan / Recommendations:  Please see A&P under problem oriented charting for assessment of the patient's acute and chronic medical conditions.  As always, pt is advised that if symptoms worsen or new symptoms arise, they should go to an urgent care facility or to to ER for further evaluation.   Consent and Medical Decision Making:  Patient seen with Dr. Jimmye Norman This is a telephone encounter between [name] and Idamae Schuller on 12/15/2020 for COVID symptoms. The visit was conducted with the patient located at home and Idamae Schuller at [facility]. The patient's identity was confirmed using their DOB and current address. The patient has consented to being evaluated through a telephone encounter and understands the associated risks (an examination cannot be done and the patient may need to come in for an appointment) / benefits (allows the patient to remain at home, decreasing exposure to coronavirus). I personally spent 35 minutes on medical discussion.

## 2020-12-21 NOTE — Telephone Encounter (Signed)
PA for Emgality 120mg  started on covermymeds (key: [id number]). Pt has pharmacy coverage through [employer] [phone]). Decision pending.
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
## 2020-12-22 DIAGNOSIS — G4440 Drug-induced headache, not elsewhere classified, not intractable: Secondary | ICD-10-CM

## 2020-12-22 DIAGNOSIS — T3995XA Adverse effect of unspecified nonopioid analgesic, antipyretic and antirheumatic, initial encounter: Secondary | ICD-10-CM | POA: Insufficient documentation

## 2020-12-22 HISTORY — DX: Drug-induced headache, not elsewhere classified, not intractable: G44.40

## 2020-12-23 DIAGNOSIS — Z0100 Encounter for examination of eyes and vision without abnormal findings: Secondary | ICD-10-CM | POA: Diagnosis not present

## 2020-12-27 DIAGNOSIS — J302 Other seasonal allergic rhinitis: Secondary | ICD-10-CM | POA: Diagnosis not present

## 2020-12-27 DIAGNOSIS — L508 Other urticaria: Secondary | ICD-10-CM | POA: Diagnosis not present

## 2020-12-27 DIAGNOSIS — F4310 Post-traumatic stress disorder, unspecified: Secondary | ICD-10-CM | POA: Diagnosis not present

## 2020-12-27 DIAGNOSIS — F331 Major depressive disorder, recurrent, moderate: Secondary | ICD-10-CM | POA: Diagnosis not present

## 2020-12-27 DIAGNOSIS — J4550 Severe persistent asthma, uncomplicated: Secondary | ICD-10-CM | POA: Diagnosis not present

## 2020-12-27 DIAGNOSIS — F411 Generalized anxiety disorder: Secondary | ICD-10-CM | POA: Diagnosis not present

## 2020-12-28 ENCOUNTER — Ambulatory Visit: Payer: Medicare Other | Admitting: Behavioral Health

## 2020-12-28 ENCOUNTER — Other Ambulatory Visit: Payer: Self-pay

## 2020-12-28 DIAGNOSIS — F419 Anxiety disorder, unspecified: Secondary | ICD-10-CM

## 2020-12-28 DIAGNOSIS — F331 Major depressive disorder, recurrent, moderate: Secondary | ICD-10-CM

## 2020-12-28 NOTE — BH Specialist Note (Signed)
Integrated Behavioral Health via Telemedicine Visit  12/28/2020 [name] [id number]  Number of Integrated Behavioral Health visits: 8 Session Start time: 1:00pm  Session End time: 1:30pm Total time: 30  Referring Provider: Dr. Cato Mulligan, MD Patient/Family location: Pt is cleaning her car & in private [facility] Provider location: [facility] Office All persons participating in visit: Pt & Clinician Types of Service: Individual psychotherapy  I connected with [name] and/or [name]'s  self  via  Telephone or Geologist, engineering  (Video is Tree surgeon) and verified that I am speaking with the correct person using two identifiers. Discussed confidentiality:  8th visit  I discussed the limitations of telemedicine and the availability of in person appointments.  Discussed there is a possibility of technology failure and discussed alternative modes of communication if that failure occurs.  I discussed that engaging in this telemedicine visit, they consent to the provision of behavioral healthcare and the services will be billed under their insurance.  Patient and/or legal guardian expressed understanding and consented to Telemedicine visit:  8th visit  Presenting Concerns: Patient and/or family reports the following symptoms/concerns: elevated activity due to recent transition to [place], [place] Duration of problem: months; Severity of problem: moderate  Patient and/or Family's Strengths/Protective Factors: Social connections, Social and Emotional competence, Concrete supports in place (healthy food, safe environments, etc.), and Caregiver has knowledge of parenting & child development  Goals Addressed: Patient will:  Reduce symptoms of: anxiety, depression, stress, and exhaustion    Increase knowledge and/or ability of: coping skills and stress reduction   Demonstrate ability to: Increase healthy adjustment to current life circumstances  Progress  towards Goals: Ongoing  Interventions: Interventions utilized:  Solution-Focused Strategies and Supportive Counseling Standardized Assessments completed:  screeners prn  Patient and/or Family Response: Pt receptive to call today & working through her move to [place], [place].   Assessment: Patient currently experiencing inc'd exhaustion due to handling her move to [place], [place] to assist her Dtr who is Autistically-challenged. Neighbor has suggested a new school to attend to Dtr's needs.  Patient may benefit from inc in resources for her Dtr's School Year in the Fall.  Plan: Follow up with behavioral health clinician on : 2-3 wks for 30 min on telehealth Behavioral recommendations: Keep your healthcare steady so you can be present for your Dtr's needs. Referral(s): [place] (In Clinic)  I discussed the assessment and treatment plan with the patient and/or parent/guardian. They were provided an opportunity to ask questions and all were answered. They agreed with the plan and demonstrated an understanding of the instructions.   They were advised to call back or seek an in-person evaluation if the symptoms worsen or if the condition fails to improve as anticipated.  Donnetta Hutching, LMFT

## 2020-12-30 DIAGNOSIS — J302 Other seasonal allergic rhinitis: Secondary | ICD-10-CM | POA: Diagnosis not present

## 2020-12-30 DIAGNOSIS — G40919 Epilepsy, unspecified, intractable, without status epilepticus: Secondary | ICD-10-CM | POA: Diagnosis not present

## 2020-12-30 DIAGNOSIS — Z9689 Presence of other specified functional implants: Secondary | ICD-10-CM | POA: Diagnosis not present

## 2020-12-30 DIAGNOSIS — L508 Other urticaria: Secondary | ICD-10-CM | POA: Diagnosis not present

## 2021-01-11 DIAGNOSIS — M170 Bilateral primary osteoarthritis of knee: Secondary | ICD-10-CM | POA: Diagnosis not present

## 2021-01-12 ENCOUNTER — Ambulatory Visit: Payer: Medicare Other | Admitting: Behavioral Health

## 2021-01-17 ENCOUNTER — Other Ambulatory Visit: Payer: Self-pay | Admitting: Orthopedic Surgery

## 2021-01-17 DIAGNOSIS — M25561 Pain in right knee: Secondary | ICD-10-CM

## 2021-01-18 ENCOUNTER — Telehealth: Payer: Self-pay | Admitting: Neurology

## 2021-01-18 NOTE — Telephone Encounter (Signed)
Abby from [facility] called wanting to speak with Dr. Rhea Belton nurse. Abby is requesting a call back at [phone] ext [phone].

## 2021-01-18 NOTE — Telephone Encounter (Signed)
Left message for a return call. Per Dr. Krista Blue, [name] can set up the patient's MRI, being ordered by the orthopaedic surgeon, around 12-12:30 - any day Monday through Thursday. The patient will need to come to our office before her scan to have her VNS turned off then come back afterwards to have it turned back on.   We will need to know the date of the scan to ensure Dr. Krista Blue will be in the office that day.

## 2021-01-18 NOTE — Telephone Encounter (Signed)
Per VNS rep, [name] (contact [phone]) okay to have MRI of knee as long as the device is turned off prior to scan and the MRI must be completed with an extremity coil.  Exclusion zones are from C7 to T8.  Patient has VNS model #1000.  The VNS will need to be turned back on after MRI.   Protocols attached below:

## 2021-01-18 NOTE — Telephone Encounter (Signed)
I have spoke to [place]. She is going to work with [facility] to get this patient scheduled. She will call me back with the date and time of scan.

## 2021-01-19 ENCOUNTER — Encounter: Payer: Medicare Other | Admitting: Behavioral Health

## 2021-01-19 ENCOUNTER — Ambulatory Visit: Payer: Medicare Other | Admitting: Behavioral Health

## 2021-01-19 DIAGNOSIS — F331 Major depressive disorder, recurrent, moderate: Secondary | ICD-10-CM

## 2021-01-19 DIAGNOSIS — F419 Anxiety disorder, unspecified: Secondary | ICD-10-CM

## 2021-01-19 NOTE — BH Specialist Note (Signed)
Integrated Behavioral Health via Telemedicine Visit  01/19/2021 DAJE MONSEN [id number]  Number of Integrated Behavioral Health visits: 9 Session Start time: 3:00pm  Session End time: 3:30pm Total time: 30  Referring Provider: Dr. Cato Mulligan, MD Patient/Family location: Pt is in [facility] w/Str getting Dtr's [place] Registration [facility] Provider location: [facility] Office All persons participating in visit: Pt & Clinician Types of Service: Individual psychotherapy  I connected with [name] and/or Naaman Plummer Henne's  self  via  Telephone or Geologist, engineering  (Video is Tree surgeon) and verified that I am speaking with the correct person using two identifiers. Discussed confidentiality:  9th visit  I discussed the limitations of telemedicine and the availability of in person appointments.  Discussed there is a possibility of technology failure and discussed alternative modes of communication if that failure occurs.  I discussed that engaging in this telemedicine visit, they consent to the provision of behavioral healthcare and the services will be billed under their insurance.  Patient and/or legal guardian expressed understanding and consented to Telemedicine visit:  9th visit  Presenting Concerns: Patient and/or family reports the following symptoms/concerns: reduced anx/dep due to recent move to [facility] where Dtr will attend special school Duration of problem: yrs ; Severity of problem: mild  Patient and/or Family's Strengths/Protective Factors: Social connections, Social and Patent attorney, Concrete supports in place (healthy food, safe environments, etc.), Sense of purpose, Physical Health (exercise, healthy diet, medication compliance, etc.), Caregiver has knowledge of parenting & child development, and Parental Resilience  Goals Addressed: Patient will:  Reduce symptoms of: anxiety, depression, and stress   Increase knowledge and/or ability  of: coping skills, healthy habits, and stress reduction   Demonstrate ability to: Increase healthy adjustment to current life circumstances  Progress towards Goals: Ongoing  Interventions: Interventions utilized:  Solution-Focused Strategies and Supportive Counseling Standardized Assessments completed: Not Needed  Patient and/or Family Response: Pt is receptive to call today & requests future appt ck-ins  Assessment: Patient currently experiencing reduced anx/dep due to move from [place] to [facility]. Pt is living closer to her Str & has inc'd support.   The move has inc'd stress due to the logistics of moving & not knowing where things are, fixing up her Dtr's room & getting her School exp started on 01/30/21.  Patient may benefit from cont'd ck-in telehealth visits.  Plan: Follow up with behavioral health clinician on : 2-3 wks on telehealth for 30 min Behavioral recommendations: cont your routine & relishing her supportive presence Referral(s): [place] (In Clinic)  I discussed the assessment and treatment plan with the patient and/or parent/guardian. They were provided an opportunity to ask questions and all were answered. They agreed with the plan and demonstrated an understanding of the instructions.   They were advised to call back or seek an in-person evaluation if the symptoms worsen or if the condition fails to improve as anticipated.  Donnetta Hutching, LMFT

## 2021-01-21 ENCOUNTER — Other Ambulatory Visit: Payer: Self-pay

## 2021-01-21 ENCOUNTER — Encounter: Payer: Self-pay | Admitting: Internal Medicine

## 2021-01-21 ENCOUNTER — Ambulatory Visit (INDEPENDENT_AMBULATORY_CARE_PROVIDER_SITE_OTHER): Payer: Medicare Other | Admitting: Internal Medicine

## 2021-01-21 VITALS — BP 164/98 | HR 80 | Temp 98.2°F | Ht 65.5 in | Wt 180.9 lb

## 2021-01-21 DIAGNOSIS — I10 Essential (primary) hypertension: Secondary | ICD-10-CM

## 2021-01-21 DIAGNOSIS — L732 Hidradenitis suppurativa: Secondary | ICD-10-CM

## 2021-01-21 DIAGNOSIS — R252 Cramp and spasm: Secondary | ICD-10-CM

## 2021-01-21 HISTORY — DX: Cramp and spasm: R25.2

## 2021-01-21 NOTE — Progress Notes (Deleted)
Rash between legs going on for past year OBGYN- asked for derm referral due to blisters between legs, amoxicillin helped No fam history of AI disorders, history of joint pains and muscle pain

## 2021-01-21 NOTE — Progress Notes (Signed)
   CC: Acute visit for hydradenitis suppurativa   HPI:  [name] is a 55 y.o. with medical history as below presenting to [facility] for acute visit for hydradenitis suppurativa.  Please see problem-based list for further details, assessments, and plans.  Past Medical History:  Diagnosis Date   Anxiety    Asthma    Biliary dyskinesia    a. s/p cholecystectomy.   BMI 40.0-44.9, adult ([place]) 06/11/2015   Chronic chest pain    ?Microvascular angina vs spasm - a. Abnl stress [place] 2008, f/u cath reportedly nl. b. ETT-Myoview 04/2011 - EKG changes but normal perfusion. Cor CT - no coronary calcium, no definite stenosis though mRCA not fully evaluated. c. 10/2011 - tn elevated in [place], LHC without CAD. Started on Ranexa, anti-anginals ?microvascular dz but later stopped while in hospital on abx.   Complication of anesthesia    hard to wake up-had to be reminded to breath   Diarrhea 08/09/2017   GERD (gastroesophageal reflux disease)    a. Severe.   History of seizure 10/19/2017   HTN (hypertension)    Hx of cardiovascular stress test    [place] Myoview 8/14:  Normal, EF 74%   Hx of echocardiogram    Echo 3/16:  Mild LVH, EF 55-60%, Gr 1 DD, trivial MR, mild LAE, normal RVF   Hydradenitis 11/09/2016   Hypothyroid    Hypothyroidism 07/02/2013   Overview:  Last Assessment & Plan:   She reports a 21 pound increase in her weight since October. She doesn't know when the last time her TSH was checked so we will do that today.   Low back pain 04/19/2016   MI (myocardial infarction) [facility])    Migraine    Morbid obesity ([place]) 07/26/2017   MRSA infection    a. After vagal nerve stimulator at [facility] - surgical site MRSA infection, PICC placed.   Obesity    OSA (obstructive sleep apnea) 07/26/2017   uses CPAP nightly   Palpitations    a. 08/2014: 48 hour holter with 2 PVCs otherwise normal.   Pre-diabetes    Rectal pain 12/19/2017   Seizure disorder ([place])    a. since childhood. b. s/p vagal nerve  stimulator at [facility]   Seizures ([place])     Review of Systems:   Constitutional: No chills, night sweats, or weight change   Physical Exam:  Vitals:   01/21/21 1111 01/21/21 1122  BP: (!) 156/106 (!) 164/98  Pulse: 92 80  Temp: 98.2 F (36.8 C)   TempSrc: Oral   SpO2: 100%   Weight: 180 lb 14.4 oz (82.1 kg)   Height: 5' 5.5" (1.664 m)     General: well-developed, ambulates with walker HENT: NCAT, no scars noted Eyes: no sclera icterus, conjunctiva clear CV: normal rate and rhythm, no m,r,g Pulm: CTAB, pulmonary effort normal GI:no tenderness, bowel sounds present Skin: extensive scarring noted to armpits bilaterally, scarring present within groin, skin tunnel and scarring present on left groin, no tenderness or drainage noted at this time Neuro: A&O x3. Moves all extremities Psych: normal mood and affect  Assessment & Plan:   See Encounters Tab for problem based charting.  Patient seen with Dr. Dareen Piano

## 2021-01-21 NOTE — Patient Instructions (Signed)
Ms.Arieliz R Buenrostro, it was a pleasure seeing you today!  Today we discussed: Hydradenitis suppurativa- with your history of recurrent abscesses on your legs and armpits, I think you have something called Hydradenitis suppurativa.  I will place a referral to dermatology for this. Leg cramps- I am getting some blood work to check your electrolytes.  Sometimes having abnormal electrolytes can lead to leg cramps. I will call you back with those results next week.  I have ordered the following labs today:   Lab Orders         BMP8+Anion Gap      Tests ordered today:  BMP  Referrals ordered today:    Referral Orders         Ambulatory referral to Dermatology      I have ordered the following medication/changed the following medications:   Stop the following medications: There are no discontinued medications.   Start the following medications: No orders of the defined types were placed in this encounter.    Follow-up: 2 months   Please make sure to arrive 15 minutes prior to your next appointment. If you arrive late, you may be asked to reschedule.   We look forward to seeing you next time. Please call our clinic at [phone] if you have any questions or concerns. The best time to call is Monday-Friday from 9am-4pm, but there is someone available 24/7. If after hours or the weekend, call the main hospital number and ask for the Internal Medicine Resident On-Call. If you need medication refills, please notify your pharmacy one week in advance and they will send [place] a request.  Thank you for letting us take part in your care. Wishing you the best!  Thank you, Dr. Hel[facility]

## 2021-01-21 NOTE — Assessment & Plan Note (Addendum)
Patient presents with complaints of frequently being woken up at night due to leg cramps.  This has been over the past few weeks.  History of CHF and HTN and is on Aldactone and Lisinopril.  Last BMP 12/09/20 wnl. Will order repeat BMP today to check for electrolyte abnormality.  Differentials also include Restless Leg Syndrome vs Peripheral Vascular Disease Plan: -repeat BMP to check electrolytes

## 2021-01-21 NOTE — Assessment & Plan Note (Addendum)
Patient presents today with history of hydradenitis.  This has been going on the last few years and she has had many I&D procedures to her armpits and groin area.  She has extensive scarring to armpits bilaterally from previous I&Ds.  No current inflamed lesions or areas.  Patient notes that she did have one spot that was draining last week, but it has since resolved.  Plan: -referral to dermatology.  Patient may benefit from referral to [facility] speciality clinic eventually.

## 2021-01-22 LAB — BMP8+ANION GAP
Anion Gap: 14 mmol/L (ref 10.0–18.0)
BUN/Creatinine Ratio: 20 (ref 9–23)
BUN: 13 mg/dL (ref 6–24)
CO2: 20 mmol/L (ref 20–29)
Calcium: 8.6 mg/dL — ABNORMAL LOW (ref 8.7–10.2)
Chloride: 111 mmol/L — ABNORMAL HIGH (ref 96–106)
Creatinine, Ser: 0.66 mg/dL (ref 0.57–1.00)
Glucose: 72 mg/dL (ref 65–99)
Potassium: 4 mmol/L (ref 3.5–5.2)
Sodium: 145 mmol/L — ABNORMAL HIGH (ref 134–144)
eGFR: 103 mL/min/{1.73_m2} (ref >59)

## 2021-01-24 NOTE — Progress Notes (Signed)
Internal Medicine Clinic Attending  I saw and evaluated the patient.  I personally confirmed the key portions of the history and exam documented by Dr. Masters and I reviewed pertinent patient test results.  The assessment, diagnosis, and plan were formulated together and I agree with the documentation in the resident's note.  

## 2021-02-08 ENCOUNTER — Telehealth: Payer: Self-pay | Admitting: Orthopaedic Surgery

## 2021-02-08 NOTE — Telephone Encounter (Signed)
Received vm from patient stating she wants to get records mailed to her. IC,lmvm advised we need signed release before able to send records. Advised will mail the release form and once we receive signed form back, we can process request. Pt ph [phone]

## 2021-02-15 ENCOUNTER — Telehealth: Payer: Self-pay | Admitting: Behavioral Health

## 2021-02-15 ENCOUNTER — Ambulatory Visit: Payer: Medicare Other | Admitting: Behavioral Health

## 2021-02-15 NOTE — Telephone Encounter (Signed)
Contacted Pt for today's session @ 2:00pm. Pt is busy w/adjustment to new residence in [place] w/her Dtr who is attending a new school. Dtr is autistic & has other special needs. Pt is understandably busy & will need to [facility] to [facility] for r/s.  Dr. Theodis Shove

## 2021-02-24 DIAGNOSIS — F4310 Post-traumatic stress disorder, unspecified: Secondary | ICD-10-CM | POA: Diagnosis not present

## 2021-02-24 DIAGNOSIS — F331 Major depressive disorder, recurrent, moderate: Secondary | ICD-10-CM | POA: Diagnosis not present

## 2021-02-24 DIAGNOSIS — F411 Generalized anxiety disorder: Secondary | ICD-10-CM | POA: Diagnosis not present

## 2021-03-02 ENCOUNTER — Ambulatory Visit: Payer: Medicare Other | Admitting: Behavioral Health

## 2021-03-02 DIAGNOSIS — F419 Anxiety disorder, unspecified: Secondary | ICD-10-CM

## 2021-03-02 DIAGNOSIS — Z87828 Personal history of other (healed) physical injury and trauma: Secondary | ICD-10-CM | POA: Diagnosis not present

## 2021-03-02 DIAGNOSIS — M25561 Pain in right knee: Secondary | ICD-10-CM | POA: Diagnosis not present

## 2021-03-02 DIAGNOSIS — F330 Major depressive disorder, recurrent, mild: Secondary | ICD-10-CM

## 2021-03-02 NOTE — BH Specialist Note (Signed)
Integrated Behavioral Health via Telemedicine Visit  03/02/2021 [name] [id number]  Number of Integrated Behavioral Health visits: 10 Session Start time: 10:00am  Session End time: 10:30am Total time: 30  Referring Provider: Dr. Cato Mulligan, MD Patient/Family location: Pt is home in private [facility] Provider location: Working remotely All persons participating in visit: Pt & Clinician Types of Service: Individual psychotherapy  I connected with [name] and/or [name]'s  self  via  Telephone or Geologist, engineering  (Video is Tree surgeon) and verified that I am speaking with the correct person using two identifiers. Discussed confidentiality:  10th visit  I discussed the limitations of telemedicine and the availability of in person appointments.  Discussed there is a possibility of technology failure and discussed alternative modes of communication if that failure occurs.  I discussed that engaging in this telemedicine visit, they consent to the provision of behavioral healthcare and the services will be billed under their insurance.  Patient and/or legal guardian expressed understanding and consented to Telemedicine visit:  10th visit  Presenting Concerns: Patient and/or family reports the following symptoms/concerns: reduction in Sx of anx/dep due to recent move to [place] the positive adjustment to Sch her 57yo Dtr is having. There are 2 girls in the class now & Dtr is making friends w/her. Pt is esp'ly happy for how her Dtr is handling things as 57yo challenged w/Autism. 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 Pt finds success & satisfaction w/learning her Dtr daily; Severity of problem: mild  Patient and/or Family's Strengths/Protective Factors: Social connections, Social and Emotional competence, Concrete supports in place (healthy food, safe environments, etc.), Sense of  purpose, and Caregiver has knowledge of parenting & child development. Pt is a determined Mother to a Teenager (IQ approximately 14yo capacity @ this time per Mother's report) who is navigating big changes w/their recent move to [place], [place].  Goals Addressed: Patient will:  Reduce symptoms of: anxiety, depression, and parental stressors raising a Teen challenged by Autism.    Increase knowledge and/or ability of: coping skills, healthy habits, stress reduction, and psychoedu for Teens & Autism    Demonstrate ability to: Increase healthy adjustment to current life circumstances and incorporation of new collaboratively-driven ideas for Teen's G & D  Progress towards Goals: Ongoing  Interventions: Interventions utilized:  Solution-Focused Strategies and Supportive Counseling Standardized Assessments completed: Not Needed  Patient and/or Family Response: Pt receptive to call & requests future ck-in as Family adjusts to move & Teen's dvlp as Pt copes  Assessment: Patient currently experiencing reduction in her level of anx/dep due to inc'd coping skills, daily diligence as a Parent & open-mindedness.   Patient may benefit from Future ck-in appts for mental health wellness & support.  Plan: Follow up with behavioral health clinician on : 2-3 wks for 30 min on telehealth Behavioral recommendations: Creative Parenting skills for Teens w/Autism Referral(s): [place] (In Clinic)  I discussed the assessment and treatment plan with the patient and/or parent/guardian. They were provided an opportunity to ask questions and all were answered. They agreed with the plan and demonstrated an understanding of the instructions.   They were advised to call back or seek an in-person evaluation if the symptoms worsen or if the condition fails to improve as anticipated.  Donnetta Hutching, LMFT

## 2021-03-03 ENCOUNTER — Encounter: Payer: Medicare Other | Admitting: Behavioral Health

## 2021-03-07 ENCOUNTER — Encounter: Payer: Self-pay | Admitting: Neurology

## 2021-03-07 ENCOUNTER — Ambulatory Visit: Payer: Medicare Other | Admitting: Neurology

## 2021-03-07 DIAGNOSIS — Z48815 Encounter for surgical aftercare following surgery on the digestive system: Secondary | ICD-10-CM | POA: Diagnosis not present

## 2021-03-07 DIAGNOSIS — Z9884 Bariatric surgery status: Secondary | ICD-10-CM | POA: Diagnosis not present

## 2021-03-08 ENCOUNTER — Telehealth: Payer: Self-pay | Admitting: *Deleted

## 2021-03-08 ENCOUNTER — Other Ambulatory Visit: Payer: Self-pay

## 2021-03-08 DIAGNOSIS — M25531 Pain in right wrist: Secondary | ICD-10-CM

## 2021-03-08 DIAGNOSIS — M25532 Pain in left wrist: Secondary | ICD-10-CM

## 2021-03-08 DIAGNOSIS — M79641 Pain in right hand: Secondary | ICD-10-CM

## 2021-03-08 MED ORDER — LIDOCAINE-PRILOCAINE 2.5-2.5 % EX CREA: 1.0000 "application " | TOPICAL_CREAM | CUTANEOUS | 0 refills | Status: DC | PRN

## 2021-03-08 NOTE — Telephone Encounter (Signed)
   [place] Pre-operative Risk Assessment    Patient Name: [name]  DOB: Feb 01, 1966 MRN: [id number]  HEARTCARE STAFF:  - IMPORTANT!!!!!! Under Visit Info/Reason for Call, type in Other and utilize the format Clearance MM/DD/YY or Clearance TBD. Do not use dashes or single digits. - Please review there is not already an duplicate clearance open for this procedure. - If request is for dental extraction, please clarify the # of teeth to be extracted. - If the patient is currently at the dentist's office, call Pre-Op Callback Staff (MA/nurse) to input urgent request.  - If the patient is not currently in the dentist office, please route to the Pre-Op pool.  Request for surgical clearance:  What type of surgery is being performed?  EGD  When is this surgery scheduled?  03/16/2021  What type of clearance is required (medical clearance vs. Pharmacy clearance to hold med vs. Both)?  MEDICAL  Are there any medications that need to be held prior to surgery and how long?  N/A  Practice name and name of physician performing surgery?  [facility] / DR. Osvaldo Human  What is the office phone number?  [phone]   7.   What is the office fax number?  [phone]  8.   Anesthesia type (None, local, MAC, general) ?  MAC   Jeanann Lewandowsky 03/08/2021, 4:05 PM  _________________________________________________________________   (provider comments below)

## 2021-03-08 NOTE — Telephone Encounter (Signed)
   Primary Cardiologist: Freada Bergeron, MD  Chart reviewed as part of pre-operative protocol coverage. Given past medical history and time since last visit, based on ACC/AHA guidelines, [name] would be at acceptable risk for the planned procedure without further cardiovascular testing.   Patient was advised that if she develops new symptoms prior to surgery to contact our office to arrange a follow-up appointment.  She verbalized understanding.  I will route this recommendation to the requesting party via Epic fax function and remove from pre-op pool.  Please call with questions.  Jossie Ng. Wise Fees NP-C    03/08/2021, 4:23 PM [place] [address] Office [phone] Fax [phone]

## 2021-03-16 DIAGNOSIS — Z9884 Bariatric surgery status: Secondary | ICD-10-CM | POA: Diagnosis not present

## 2021-03-16 DIAGNOSIS — K9189 Other postprocedural complications and disorders of digestive system: Secondary | ICD-10-CM | POA: Diagnosis not present

## 2021-03-16 DIAGNOSIS — R9431 Abnormal electrocardiogram [ECG] [EKG]: Secondary | ICD-10-CM | POA: Diagnosis not present

## 2021-03-16 DIAGNOSIS — Z0000 Encounter for general adult medical examination without abnormal findings: Secondary | ICD-10-CM | POA: Diagnosis not present

## 2021-03-16 DIAGNOSIS — E039 Hypothyroidism, unspecified: Secondary | ICD-10-CM | POA: Diagnosis not present

## 2021-03-16 DIAGNOSIS — R1013 Epigastric pain: Secondary | ICD-10-CM | POA: Diagnosis not present

## 2021-03-16 DIAGNOSIS — M545 Low back pain, unspecified: Secondary | ICD-10-CM | POA: Diagnosis not present

## 2021-03-16 DIAGNOSIS — I5032 Chronic diastolic (congestive) heart failure: Secondary | ICD-10-CM | POA: Diagnosis not present

## 2021-03-16 DIAGNOSIS — E785 Hyperlipidemia, unspecified: Secondary | ICD-10-CM | POA: Diagnosis not present

## 2021-03-16 DIAGNOSIS — I110 Hypertensive heart disease with heart failure: Secondary | ICD-10-CM | POA: Diagnosis not present

## 2021-03-16 DIAGNOSIS — K909 Intestinal malabsorption, unspecified: Secondary | ICD-10-CM | POA: Diagnosis not present

## 2021-03-16 DIAGNOSIS — Z6841 Body Mass Index (BMI) 40.0 and over, adult: Secondary | ICD-10-CM | POA: Diagnosis not present

## 2021-03-16 DIAGNOSIS — K219 Gastro-esophageal reflux disease without esophagitis: Secondary | ICD-10-CM | POA: Diagnosis not present

## 2021-03-16 DIAGNOSIS — Z79899 Other long term (current) drug therapy: Secondary | ICD-10-CM | POA: Diagnosis not present

## 2021-03-16 DIAGNOSIS — R109 Unspecified abdominal pain: Secondary | ICD-10-CM | POA: Diagnosis not present

## 2021-03-16 DIAGNOSIS — I2510 Atherosclerotic heart disease of native coronary artery without angina pectoris: Secondary | ICD-10-CM | POA: Diagnosis not present

## 2021-03-22 ENCOUNTER — Other Ambulatory Visit: Payer: Self-pay | Admitting: *Deleted

## 2021-03-22 NOTE — Telephone Encounter (Signed)
Call from pt requesting a refill on Levothyroxine. Also stated she had blood work done at [facility] ( results in chart) and was told to call her doctor to see if she needs to come in for an appt.

## 2021-03-23 MED ORDER — LEVOTHYROXINE SODIUM 50 MCG PO TABS: 50.0000 ug | ORAL_TABLET | Freq: Every day | ORAL | 1 refills | Status: DC

## 2021-03-24 ENCOUNTER — Telehealth: Payer: Self-pay

## 2021-03-24 ENCOUNTER — Ambulatory Visit: Payer: Medicare Other | Admitting: Behavioral Health

## 2021-03-24 DIAGNOSIS — F419 Anxiety disorder, unspecified: Secondary | ICD-10-CM

## 2021-03-24 DIAGNOSIS — G4733 Obstructive sleep apnea (adult) (pediatric): Secondary | ICD-10-CM | POA: Diagnosis not present

## 2021-03-24 DIAGNOSIS — F331 Major depressive disorder, recurrent, moderate: Secondary | ICD-10-CM

## 2021-03-24 NOTE — Telephone Encounter (Signed)
Pa for pt ( LIDOCAINE-PRILOCAINE 2.5-2.5% CREAM ) came through on cover my meds was done and sent back

## 2021-03-24 NOTE — BH Specialist Note (Signed)
Integrated Behavioral Health via Telemedicine Visit  03/24/2021 [name] [id number]  Number of Integrated Behavioral Health visits: 11 Session Start time: 1:10pm  Session End time: 1:30pm Total time: 20  Referring Provider: Dr. Cato Mulligan, MD Patient/Family location: Pt is home in private  [facility] Provider location: [facility] Office All persons participating in visit: Pt & Clinician Types of Service: Individual psychotherapy  I connected with [name] and/or [name]'s  self  via  Telephone or Geologist, engineering  (Video is Tree surgeon) and verified that I am speaking with the correct person using two identifiers. Discussed confidentiality:  11th visit  I discussed the limitations of telemedicine and the availability of in person appointments.  Discussed there is a possibility of technology failure and discussed alternative modes of communication if that failure occurs.  I discussed that engaging in this telemedicine visit, they consent to the provision of behavioral healthcare and the services will be billed under their insurance.  Patient and/or legal guardian expressed understanding and consented to Telemedicine visit: Yes   Presenting Concerns: Patient and/or family reports the following symptoms/concerns: elevated pain in knees bilaterally Duration of problem: last few days; Severity of problem: moderate  Patient and/or Family's Strengths/Protective Factors: Social connections, Social and Emotional competence, Concrete supports in place (healthy food, safe environments, etc.), and Sense of purpose  Goals Addressed: Patient will:  Reduce symptoms of: anxiety, depression, and stress   Increase knowledge and/or ability of: coping skills and stress reduction   Demonstrate ability to: Increase healthy adjustment to current life circumstances  Progress towards Goals: Ongoing  Interventions: Interventions utilized:  Supportive  Counseling Standardized Assessments completed:  screeners prn  Patient and/or Family Response: Pt consistently receptive to telehealth visits. Pt requests future appts  Assessment: Patient currently experiencing inc in anx/dep as she & Dtr adjust to their new residence in [place].   Pt wants to secure employment w/a hybrid opportunity w/any employer who hires her.  Patient may benefit from cont'd support while adjusting to the move & getting her healthcare.  Plan: Follow up with behavioral health clinician on : 2-3 wks on telehealth for 30 min Behavioral recommendations: cont to care for own health & Dtr's needs Referral(s): [place] (In Clinic)  I discussed the assessment and treatment plan with the patient and/or parent/guardian. They were provided an opportunity to ask questions and all were answered. They agreed with the plan and demonstrated an understanding of the instructions.   They were advised to call back or seek an in-person evaluation if the symptoms worsen or if the condition fails to improve as anticipated.  Donnetta Hutching, LMFT

## 2021-03-25 NOTE — Telephone Encounter (Signed)
DECISION :    Approvedon October 20  Effective from 03/24/2021 through 03/24/2021. Drug Lidocaine-Prilocaine 2.5-2.5% cream   ( COPY SENT TO PHARMACY )

## 2021-03-31 DIAGNOSIS — G4733 Obstructive sleep apnea (adult) (pediatric): Secondary | ICD-10-CM | POA: Diagnosis not present

## 2021-03-31 DIAGNOSIS — G40919 Epilepsy, unspecified, intractable, without status epilepticus: Secondary | ICD-10-CM | POA: Diagnosis not present

## 2021-03-31 DIAGNOSIS — Z9682 Presence of neurostimulator: Secondary | ICD-10-CM | POA: Diagnosis not present

## 2021-03-31 DIAGNOSIS — Z888 Allergy status to other drugs, medicaments and biological substances status: Secondary | ICD-10-CM | POA: Diagnosis not present

## 2021-03-31 DIAGNOSIS — M170 Bilateral primary osteoarthritis of knee: Secondary | ICD-10-CM | POA: Diagnosis not present

## 2021-03-31 DIAGNOSIS — Z886 Allergy status to analgesic agent status: Secondary | ICD-10-CM | POA: Diagnosis not present

## 2021-03-31 DIAGNOSIS — Z881 Allergy status to other antibiotic agents status: Secondary | ICD-10-CM | POA: Diagnosis not present

## 2021-04-04 ENCOUNTER — Encounter: Payer: Self-pay | Admitting: Pharmacist

## 2021-04-04 ENCOUNTER — Ambulatory Visit (INDEPENDENT_AMBULATORY_CARE_PROVIDER_SITE_OTHER): Payer: Medicare Other | Admitting: Pharmacist

## 2021-04-04 VITALS — BP 123/90 | Wt 187.0 lb

## 2021-04-04 DIAGNOSIS — Z0000 Encounter for general adult medical examination without abnormal findings: Secondary | ICD-10-CM

## 2021-04-04 NOTE — Progress Notes (Signed)
This AWV is being conducted by [facility] only. The patient was located at home and I was located in [facility]. The patient's identity was confirmed using their DOB and current address. The patient or his/her legal guardian has consented to being evaluated through a telephone encounter and understands the associated risks (an examination cannot be done and the patient may need to come in for an appointment) / benefits (allows the patient to remain at home, decreasing exposure to coronavirus). I personally spent 47 minutes conducting the AWV.  Subjective:   [name] is a 55 y.o. female who presents for a Medicare Annual Wellness Visit.  The following items have been reviewed and updated today in the appropriate area in the EMR.   Health Risk Assessment  Height, weight, BMI, and BP Visual acuity if needed Depression screen Fall risk / safety level Advance directive discussion Medical and family history were reviewed and updated Updating list of other providers & suppliers Medication reconciliation, including over the counter medicines Cognitive screen Written screening schedule Risk Factor list Personalized health advice, risky behaviors, and treatment advice  Social History   Social History Narrative   Current Social History 04/04/2021        Patient lives with 26 yo daughter in a townhome which is 2 story/stories. There are not steps up to the entrance the patient uses.       Patient's method of transportation is via family member.      The highest level of education was college diploma.      The patient currently disabled.      Identified important Relationships are "my children"      Pets : 0       Interests / Fun: "crafting"      Current Stressors: "income       Religious / Personal Beliefs: "Jehovah's Witness"    Cardiac Risk Factors include: hypertension;obesity (BMI >30kg/m2);sedentary lifestyle;dyslipidemia    Objective:    Vitals: BP 123/90   Wt  187 lb (84.8 kg)   LMP 04/13/2018   BMI 30.65 kg/m  Vitals are patient reported  Activities of Daily Living In your present state of health, do you have any difficulty performing the following activities: 04/04/2021 01/21/2021  Hearing? N N  Vision? Y N  Comment Just had prescription changed Blurry with headaches  Difficulty concentrating or making decisions? Y N  Walking or climbing stairs? Y N  Dressing or bathing? Y N  Doing errands, shopping? Y N  Comment - Diver most of the Investment banker, corporate and eating ? N -  Using the Toilet? N -  In the past six months, have you accidently leaked urine? Y -  Do you have problems with loss of bowel control? Y -  Managing your Medications? N -  Managing your Finances? N -  Housekeeping or managing your Housekeeping? Y -  Some recent data might be hidden    Goals  Goals   None     Fall Risk Fall Risk  04/04/2021 01/21/2021 06/25/2020 03/30/2020 03/02/2020  Falls in the past year? 1 1 1 1 1   Number falls in past yr: 1 1 1 1 1   Injury with Fall? 1 1 1 1 1   Comment - - - - -  Risk Factor Category  - - - - -  Risk for fall due to : History of fall(s);Impaired balance/gait History of fall(s);Impaired balance/gait;Impaired mobility Impaired balance/gait Impaired balance/gait;History of fall(s);Impaired mobility Impaired balance/gait;History  of fall(s);Impaired mobility  Risk for fall due to: Comment - - - - -  Follow up Falls evaluation completed;Education provided;Falls prevention discussed Falls prevention discussed - Falls evaluation completed Falls evaluation completed    Depression Screen PHQ 2/9 Scores 04/04/2021 01/21/2021 06/25/2020 04/20/2020  PHQ - 2 Score 2 6 5 4   PHQ- 9 Score 19 19 19 16      Cognitive Testing Six-Item Cognitive Screener   "I would like to ask you some questions that ask you to use your memory. I am going to name three objects. Please wait until I say all three words, then repeat them. Remember what they are   because I am going to ask you to name them again in a few minutes. Please repeat these words for me: APPLE--TABLE--PENNY." (Interviewer may repeat names 3 times if necessary but repetition not scored.)  Did patient correctly repeat all three words? Yes - may proceed with screen  What year is this? Correct What month is this? Correct What day of the week is this? Correct  What were the three objects I asked you to remember? Apple Correct Table Incorrect Penny Correct  Score one point for each incorrect answer.  A score of 2 or more points warrants additional investigation.  Patient's score 1     Assessment and Plan:    During the course of the visit the patient was educated and counseled about appropriate screening and preventive services as documented in the assessment and plan.  Recommended patient obtain pneumonia and shingles vaccines.  The printed AVS was given to the patient and included an updated screening schedule, a list of risk factors, and personalized health advice.        Hughes Better, RPH-CPP  04/04/2021

## 2021-04-07 DIAGNOSIS — Z0100 Encounter for examination of eyes and vision without abnormal findings: Secondary | ICD-10-CM | POA: Diagnosis not present

## 2021-04-08 ENCOUNTER — Other Ambulatory Visit: Payer: Self-pay

## 2021-04-08 ENCOUNTER — Ambulatory Visit (INDEPENDENT_AMBULATORY_CARE_PROVIDER_SITE_OTHER): Payer: Medicare Other | Admitting: Internal Medicine

## 2021-04-08 ENCOUNTER — Encounter: Payer: Self-pay | Admitting: Internal Medicine

## 2021-04-08 ENCOUNTER — Ambulatory Visit (HOSPITAL_COMMUNITY)
Admission: RE | Admit: 2021-04-08 | Discharge: 2021-04-08 | Disposition: A | Discharge status: Home / Self Care | Payer: Medicare Other | Source: Ambulatory Visit | Attending: Internal Medicine | Admitting: Internal Medicine

## 2021-04-08 VITALS — BP 143/84 | HR 79 | Temp 97.6°F | Ht 65.0 in | Wt 188.9 lb

## 2021-04-08 DIAGNOSIS — I10 Essential (primary) hypertension: Secondary | ICD-10-CM

## 2021-04-08 DIAGNOSIS — R0602 Shortness of breath: Secondary | ICD-10-CM

## 2021-04-08 DIAGNOSIS — R002 Palpitations: Secondary | ICD-10-CM | POA: Diagnosis not present

## 2021-04-08 DIAGNOSIS — Z0000 Encounter for general adult medical examination without abnormal findings: Secondary | ICD-10-CM

## 2021-04-08 DIAGNOSIS — E559 Vitamin D deficiency, unspecified: Secondary | ICD-10-CM

## 2021-04-08 DIAGNOSIS — N959 Unspecified menopausal and perimenopausal disorder: Secondary | ICD-10-CM

## 2021-04-08 DIAGNOSIS — Z23 Encounter for immunization: Secondary | ICD-10-CM

## 2021-04-08 DIAGNOSIS — R748 Abnormal levels of other serum enzymes: Secondary | ICD-10-CM | POA: Insufficient documentation

## 2021-04-08 DIAGNOSIS — I700 Atherosclerosis of aorta: Secondary | ICD-10-CM

## 2021-04-08 NOTE — Assessment & Plan Note (Addendum)
Patient presents due to episodes of heart palpitations leading to feeling flushed, shortness of breath, and light-headedness.  She states that these episodes happen while she is sitting down and relaxing.    Patient has followed previously with Dr. [name] and Dr. Meda Coffee and has been extensively evaluated for chest pain.  She had an abnormal stress test in 2008 that was followed up with a normal cardiac catheterization.  Coronary CT angiogram in 2012 showed calcium score 0.  NSTEMI in [place] in 10/2011 with elevated troponin but left heart cath showed normal coronaries.  Patient underwent bariatric surgery in May 2021 and lost over 100 pounds.  Since then she states that her dyspnea with exertion has improved substantially.  She did have COVID in January of this year and following that she had chest discomfort and fatigue.  Stress test completed in March 2022 was within normal limits and TTE in March 2022 showed normal left ventricular ejection fraction of 70 to 75%, with no wall motion abnormalities.  Assessment: Patient has had cardiac work-up that was not concerning for obstructive vessels.  Patient was not currently having shortness of breath, but EKG completed in clinic to evaluate for changes.  EKG with normal sinus rhythm and J point elevations in leads V2 and V3 that are seen on EKG completed 08/2020.  These findings are reassuring.  Patient also has history of Heart Failure with preserved Ejection Fraction for which she has not taken Lasix in the past year.  Weight has increased 15 lbs in last 6 months.  However on physical exam, she appears euvolemic.  Does note that her blood pressure seems to oscillate frequently.  She records her blood pressure twice daily and if it is normotensive in the morning she does not take her blood pressure medications because she is concerned that she will have low blood pressure and become dizzy.  She has had this happen in the past, but states that with the most  recent episodes she has not noticed that her blood pressure is related to these.  I think is that with cardiac source being less likely, thinking that episodes could be related to menopause.  She states she has not had a period in the last year.  Unfortunately, with other medications patient is currently on, I am hesitant to add SNRI to help with the symptoms.  Plan: -Asked patient to split dose of lisinopril to take this medication twice daily. -Carvedilol 25 mg twice daily, lisinopril 10 mg twice daily, and spironolactone 25 mg daily -Asked patient to call clinic back symptoms persist or worsen

## 2021-04-08 NOTE — Assessment & Plan Note (Signed)
Please see vitamin D deficiency  Plan: Recheck CMP in 3 months
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
## 2021-04-08 NOTE — Assessment & Plan Note (Signed)
Aortic atherosclerosis seen on CT imaging 05/21/2020.  Last lipid panel checked 03/2021 LDL at 70, close to goal of <70.  Patient is not currently on statin therapy and is well managed following gastric bypass surgery.

## 2021-04-08 NOTE — Assessment & Plan Note (Signed)
Patient states that she checks her blood pressure two times daily.  If her blood pressure is within normal limits, then she does not take her morning blood pressure medications because she felt dizzy after taking meds in past.  BP Readings from Last 3 Encounters:  04/08/21 (!) 143/84  04/04/21 123/90  01/21/21 (!) 164/98   Medications:  Lisinopril 20mg  qd, Carvedilol 25 mg BID, Spironolactone 25 mg qd  Assessment: her blood pressure is elevated in clinic today. I am hesitant to add additional blood pressure medications with fluctuations that she mentions.    Plan: -Asked patient to try splitting lisinopril dosage into 10 mg twice daily because with her carvedilol, and then take spironolactone in afternoon.   -Follow-up in 3 months

## 2021-04-08 NOTE — Assessment & Plan Note (Signed)
Patient underwent bariatric surgery in 2021.  She recently followed up with her bariatric surgeon in for complete symptoms and possibly as well as blood work to evaluate for vitamin deficiencies.  She was found to be vitamin D deficient, PTH was elevated with normal calcium, and elevated phosphatase 146.  Patient began taking vitamin D daily supplementation about 2 weeks ago.  Initially concerned because she had not alkaline phosphatase and saw that it was related to the liver.  Reassured patient that her mild elevation in the alkaline phosphatase was due to vitamin D deficiency and as this is replaced her alkaline phosphatase would also improve.  Plan: -Continue vitamin D supplementation - Recheck CMP in 3 months.

## 2021-04-08 NOTE — Assessment & Plan Note (Signed)
She received flu vaccination and tetanus vaccine today

## 2021-04-08 NOTE — Patient Instructions (Addendum)
[name], it was a pleasure seeing you today!  Today we discussed: Shortness of breath/ heart palpitations/ feeling flushed-  We talked about the episodes you have been having over the last couple of months. We did an EKG and this was reassuring that your heart not beating abnormally.  It is likely you are going through menopause.  It is no longer recommended to give patients hormone therapy, but it your symptoms get worse we can consider trying a different medications.  We were hesitant to start this because of how many other medications you are on.  Abnormal Labs- We also talked about the elevated Alk phos.  This is because of low vitamin D.  When you come back in 3 months we will recheck blood work, but nothing concerning for right now and continue taking your vitamin D supplementation.  Blood pressure-  You said you are having trouble with feeling dizzy from blood pressure medications.  Please try taking Carvedilol 25 mg and lisinopril 10 mg once in the morning and again in the evening.  Please take aldactone as you have been.  We are thinking splitting up the lisinopril dose will help with that symptom.  I have ordered the following labs today:  Lab Orders  No laboratory test(s) ordered today     Tests ordered today:  none  Referrals ordered today:   Referral Orders  No referral(s) requested today     I have ordered the following medication/changed the following medications:   Stop the following medications: There are no discontinued medications.   Start the following medications: No orders of the defined types were placed in this encounter.    Follow-up: 3 months   Please make sure to arrive 15 minutes prior to your next appointment. If you arrive late, you may be asked to reschedule.   We look forward to seeing you next time. Please call our clinic at [phone] if you have any questions or concerns. The best time to call is Monday-Friday from 9am-4pm, but  there is someone available 24/7. If after hours or the weekend, call the main hospital number and ask for the Internal Medicine Resident On-Call. If you need medication refills, please notify your pharmacy one week in advance and they will send [place] a request.  Thank you for letting us take part in your care. Wishing you the best!  Thank you, Dr. [facility]

## 2021-04-08 NOTE — Patient Instructions (Addendum)
Annual Wellness Visit   Medicare Covered Preventative Screenings and Services  Services & Screenings Men and Women Who How Often Need? Date of Last Service Action  Abdominal Aortic Aneurysm Adults with AAA risk factors Once     Alcohol Misuse and Counseling All Adults Screening once a year if no alcohol misuse. Counseling up to 4 face to face sessions.     Bone Density Measurement  Adults at risk for osteoporosis Once every 2 yrs     Lipid Panel Z13.6 All adults without CV disease Once every 5 yrs     Colorectal Cancer  Stool sample or Colonoscopy All adults 58 and older  Once every year Every 10 years     Depression All Adults Once a year  Today   Diabetes Screening Blood glucose, post glucose load, or GTT Z13.1 All adults at risk Pre-diabetics Once per year Twice per year     Diabetes  Self-Management Training All adults Diabetics 10 hrs first year; 2 hours subsequent years. Requires Copay     Glaucoma Diabetics Family history of glaucoma African Americans 67 yrs + Hispanic Americans 73 yrs + Annually - requires coppay     Hepatitis C Z72.89 or F19.20 High Risk for HCV Born between 1945 and 1965 Annually Once     HIV Z11.4 All adults based on risk Annually btw ages 38 & 62 regardless of risk Annually > 65 yrs if at increased risk     Lung Cancer Screening Asymptomatic adults aged 20-77 with 30 pack yr history and current smoker OR quit within the last 15 yrs Annually Must have counseling and shared decision making documentation before first screen     Medical Nutrition Therapy Adults with  Diabetes Renal disease Kidney transplant within past 3 yrs 3 hours first year; 2 hours subsequent years     Obesity and Counseling All adults Screening once a year Counseling if BMI 30 or higher  Today   Tobacco Use Counseling Adults who use tobacco  Up to 8 visits in one year     Vaccines Z23 Hepatitis B Influenza  Pneumonia  Adults  Once Once every flu season Two different  vaccines separated by one year     Next Annual Wellness Visit People with Medicare Every year  Today     Services & Screenings Women Who How Often Need  Date of Last Service Action  Mammogram  Z12.31 Women over 46 One baseline ages 33-39. Annually ager 40 yrs+     Pap tests All women Annually if high risk. Every 2 yrs for normal risk women     Screening for cervical cancer with  Pap (Z01.419 nl or Z01.411abnl) & HPV Z11.51 Women aged 23 to 28 Once every 5 yrs     Screening pelvic and breast exams All women Annually if high risk. Every 2 yrs for normal risk women     Sexually Transmitted Diseases Chlamydia Gonorrhea Syphilis All at risk adults Annually for non pregnant females at increased risk         [place] Men Who How Ofter Need  Date of Last Service Action  Prostate Cancer - DRE & PSA Men over 50 Annually.  DRE might require a copay.     Sexually Transmitted Diseases Syphilis All at risk adults Annually for men at increased risk         Things That May Be Affecting Your Health:  Alcohol  Hearing loss x Pain    Depression  Home  Safety  Sexual Health   Diabetes x Lack of physical activity  Stress  x Difficulty with daily activities  Loneliness  Tiredness   Drug use  Medicines  Tobacco use   Falls  Motor Vehicle Safety  Weight   Food choices  Oral Health  Other    YOUR PERSONALIZED HEALTH PLAN : 1. Schedule your next subsequent Medicare Wellness visit in one year 2. Attend all of your regular appointments to address your medical issues 3. Complete the preventative screenings and services 4. Recommended shingles and pneumonia vaccines    Fall Prevention in the Home, Adult Falls can cause injuries and can happen to people of all ages. There are many things you can do to make your home safe and to help prevent falls. Ask for help when making these changes. What actions can I take to prevent falls? General Instructions Use good lighting in all rooms.  Replace any light bulbs that burn out. Turn on the lights in dark areas. Use night-lights. Keep items that you use often in easy-to-reach places. Lower the shelves around your home if needed. Set up your furniture so you have a clear path. Avoid moving your furniture around. Do not have throw rugs or other things on the floor that can make you trip. Avoid walking on wet floors. If any of your floors are uneven, fix them. Add color or contrast paint or tape to clearly mark and help you see: Grab bars or handrails. First and last steps of staircases. Where the edge of each step is. If you use a stepladder: Make sure that it is fully opened. Do not climb a closed stepladder. Make sure the sides of the stepladder are locked in place. Ask someone to hold the stepladder while you use it. Know where your pets are when moving through your home. What can I do in the bathroom?   Keep the floor dry. Clean up any water on the floor right away. Remove soap buildup in the tub or shower. Use nonskid mats or decals on the floor of the tub or shower. Attach bath mats securely with double-sided, nonslip rug tape. If you need to sit down in the shower, use a plastic, nonslip stool. Install grab bars by the toilet and in the tub and shower. Do not use towel bars as grab bars. What can I do in the bedroom? Make sure that you have a light by your bed that is easy to reach. Do not use any sheets or blankets for your bed that hang to the floor. Have a firm chair with side arms that you can use for support when you get dressed. What can I do in the kitchen? Clean up any spills right away. If you need to reach something above you, use a step stool with a grab bar. Keep electrical cords out of the way. Do not use floor polish or wax that makes floors slippery. What can I do with my stairs? Do not leave any items on the stairs. Make sure that you have a light switch at the top and the bottom of the  stairs. Make sure that there are handrails on both sides of the stairs. Fix handrails that are broken or loose. Install nonslip stair treads on all your stairs. Avoid having throw rugs at the top or bottom of the stairs. Choose a carpet that does not hide the edge of the steps on the stairs. Check carpeting to make sure that it is firmly attached to  the stairs. Fix carpet that is loose or worn. What can I do on the outside of my home? Use bright outdoor lighting. Fix the edges of walkways and driveways and fix any cracks. Remove anything that might make you trip as you walk through a door, such as a raised step or threshold. Trim any bushes or trees on paths to your home. Check to see if handrails are loose or broken and that both sides of all steps have handrails. Install guardrails along the edges of any raised decks and porches. Clear paths of anything that can make you trip, such as tools or rocks. Have leaves, snow, or ice cleared regularly. Use sand or salt on paths during winter. Clean up any spills in your garage right away. This includes grease or oil spills. What other actions can I take? Wear shoes that: Have a low heel. Do not wear high heels. Have rubber bottoms. Feel good on your feet and fit well. Are closed at the toe. Do not wear open-toe sandals. Use tools that help you move around if needed. These include: Canes. Walkers. Scooters. Crutches. Review your medicines with your doctor. Some medicines can make you feel dizzy. This can increase your chance of falling. Ask your doctor what else you can do to help prevent falls. Where to find more information Centers for Disease Control and Prevention, [employer]: http://www.wolf.info/ National Institute on Aging: http://kim-miller.com/ Contact a doctor if: You are afraid of falling at home. You feel weak, drowsy, or dizzy at home. You fall at home. Summary There are many simple things that you can do to make your home safe and to help  prevent falls. Ways to make your home safe include removing things that can make you trip and installing grab bars in the bathroom. Ask for help when making these changes in your home. This information is not intended to replace advice given to you by your health care provider. Make sure you discuss any questions you have with your health care provider. Document Revised: 12/24/2019 Document Reviewed: 12/24/2019 Elsevier Patient Education  [place] Maintenance, Female Adopting a healthy lifestyle and getting preventive care are important in promoting health and wellness. Ask your health care provider about: The right schedule for you to have regular tests and exams. Things you can do on your own to prevent diseases and keep yourself healthy. What should I know about diet, weight, and exercise? Eat a healthy diet  Eat a diet that includes plenty of vegetables, fruits, low-fat dairy products, and lean protein. Do not eat a lot of foods that are high in solid fats, added sugars, or sodium. Maintain a healthy weight Body mass index (BMI) is used to identify weight problems. It estimates body fat based on height and weight. Your health care provider can help determine your BMI and help you achieve or maintain a healthy weight. Get regular exercise Get regular exercise. This is one of the most important things you can do for your health. Most adults should: Exercise for at least 150 minutes each week. The exercise should increase your heart rate and make you sweat (moderate-intensity exercise). Do strengthening exercises at least twice a week. This is in addition to the moderate-intensity exercise. Spend less time sitting. Even light physical activity can be beneficial. Watch cholesterol and blood lipids Have your blood tested for lipids and cholesterol at 57 years of age, then have this test every 5 years. Have your cholesterol levels checked more often if: Your  lipid or  cholesterol levels are high. You are older than 57 years of age. You are at high risk for heart disease. What should I know about cancer screening? Depending on your health history and family history, you may need to have cancer screening at various ages. This may include screening for: Breast cancer. Cervical cancer. Colorectal cancer. Skin cancer. Lung cancer. What should I know about heart disease, diabetes, and high blood pressure? Blood pressure and heart disease High blood pressure causes heart disease and increases the risk of stroke. This is more likely to develop in people who have high blood pressure readings or are overweight. Have your blood pressure checked: Every 3-5 years if you are 49-72 years of age. Every year if you are 90 years old or older. Diabetes Have regular diabetes screenings. This checks your fasting blood sugar level. Have the screening done: Once every three years after age 59 if you are at a normal weight and have a low risk for diabetes. More often and at a younger age if you are overweight or have a high risk for diabetes. What should I know about preventing infection? Hepatitis B If you have a higher risk for hepatitis B, you should be screened for this virus. Talk with your health care provider to find out if you are at risk for hepatitis B infection. Hepatitis C Testing is recommended for: Everyone born from [birth year] through 1965. Anyone with known risk factors for hepatitis C. Sexually transmitted infections (STIs) Get screened for STIs, including gonorrhea and chlamydia, if: You are sexually active and are younger than 57 years of age. You are older than 57 years of age and your health care provider tells you that you are at risk for this type of infection. Your sexual activity has changed since you were last screened, and you are at increased risk for chlamydia or gonorrhea. Ask your health care provider if you are at risk. Ask your health care  provider about whether you are at high risk for HIV. Your health care provider may recommend a prescription medicine to help prevent HIV infection. If you choose to take medicine to prevent HIV, you should first get tested for HIV. You should then be tested every 3 months for as long as you are taking the medicine. Pregnancy If you are about to stop having your period (premenopausal) and you may become pregnant, seek counseling before you get pregnant. Take 400 to 800 micrograms (mcg) of folic acid every day if you become pregnant. Ask for birth control (contraception) if you want to prevent pregnancy. Osteoporosis and menopause Osteoporosis is a disease in which the bones lose minerals and strength with aging. This can result in bone fractures. If you are 62 years old or older, or if you are at risk for osteoporosis and fractures, ask your health care provider if you should: Be screened for bone loss. Take a calcium or vitamin D supplement to lower your risk of fractures. Be given hormone replacement therapy (HRT) to treat symptoms of menopause. Follow these instructions at home: Alcohol use Do not drink alcohol if: Your health care provider tells you not to drink. You are pregnant, may be pregnant, or are planning to become pregnant. If you drink alcohol: Limit how much you have to: 0-1 drink a day. Know how much alcohol is in your drink. In the [place] one drink equals one 12 oz bottle of beer (355 mL), one 5 oz glass of wine (148 mL), or one 1  oz glass of hard liquor (44 mL). Lifestyle Do not use any products that contain nicotine or tobacco. These products include cigarettes, chewing tobacco, and vaping devices, such as e-cigarettes. If you need help quitting, ask your health care provider. Do not use street drugs. Do not share needles. Ask your health care provider for help if you need support or information about quitting drugs. General instructions Schedule regular health, dental, and  eye exams. Stay current with your vaccines. Tell your health care provider if: You often feel depressed. You have ever been abused or do not feel safe at home. Summary Adopting a healthy lifestyle and getting preventive care are important in promoting health and wellness. Follow your health care provider's instructions about healthy diet, exercising, and getting tested or screened for diseases. Follow your health care provider's instructions on monitoring your cholesterol and blood pressure. This information is not intended to replace advice given to you by your health care provider. Make sure you discuss any questions you have with your health care provider. Document Revised: 10/11/2020 Document Reviewed: 10/11/2020 Elsevier Patient Education  [place].

## 2021-04-11 NOTE — Progress Notes (Signed)
I discussed the AWV findings with the provider who conducted the visit. I was present in the office suite and immediately available to provide assistance and direction throughout the time the service was provided.    Christiana Fuchs, DO Internal Medicine PGY1

## 2021-04-15 NOTE — Progress Notes (Signed)
Internal Medicine Clinic Attending  I saw and evaluated the patient.  I personally confirmed the key portions of the history and exam documented by Dr. Masters and I reviewed pertinent patient test results.  The assessment, diagnosis, and plan were formulated together and I agree with the documentation in the resident's note.  

## 2021-04-15 NOTE — Progress Notes (Signed)
Internal Medicine Clinic Attending  Case discussed with Dr. Howie Ill.  I reviewed the AWV findings.  I agree with the assessment, diagnosis, and plan of care documented in the AWV note.

## 2021-04-18 ENCOUNTER — Ambulatory Visit: Payer: Medicare Other | Admitting: Behavioral Health

## 2021-04-18 DIAGNOSIS — F331 Major depressive disorder, recurrent, moderate: Secondary | ICD-10-CM

## 2021-04-18 DIAGNOSIS — F419 Anxiety disorder, unspecified: Secondary | ICD-10-CM

## 2021-04-18 DIAGNOSIS — Z72821 Inadequate sleep hygiene: Secondary | ICD-10-CM

## 2021-04-18 NOTE — BH Specialist Note (Signed)
Integrated Behavioral Health via Telemedicine Visit  04/18/2021 [name] [id number]  Number of Integrated Behavioral Health visits: 12 Session Start time: 1:30pm  Session End time: 2:00pm Total time: 30  Referring Provider: Dr. Cato Mulligan, MD Patient/Family location: Pt is home in private [facility] Provider location: [facility] Office All persons participating in visit: Pt & Clinician Types of Service: Individual psychotherapy  I connected with [name] and/or [name]'s  self  via  Telephone or Geologist, engineering  (Video is Tree surgeon) and verified that I am speaking with the correct person using two identifiers. Discussed confidentiality:  12th visit  I discussed the limitations of telemedicine and the availability of in person appointments.  Discussed there is a possibility of technology failure and discussed alternative modes of communication if that failure occurs.  I discussed that engaging in this telemedicine visit, they consent to the provision of behavioral healthcare and the services will be billed under their insurance.  Patient and/or legal guardian expressed understanding and consented to Telemedicine visit:  12th visit  Presenting Concerns: Patient and/or family reports the following symptoms/concerns: elevated anx w/related psychological overwhelm Duration of problem: months; Severity of problem: moderate  Patient and/or Family's Strengths/Protective Factors: Social and Emotional competence, Concrete supports in place (healthy food, safe environments, etc.), and Sense of purpose  Goals Addressed: Patient will:  Reduce symptoms of: anxiety, depression, stress, and disrupted sleep hygiene    Increase knowledge and/or ability of: coping skills, healthy habits, and stress reduction   Demonstrate ability to: Increase healthy adjustment to current life circumstances  Progress towards  Goals: Ongoing  Interventions: Interventions utilized:  Solution-Focused Strategies, Mindfulness or Relaxation Training, and Supportive Counseling Standardized Assessments completed:  screeners prn  Patient and/or Family Response: Pt receptive to call today & requests future appts for ck-in  Assessment: Patient currently experiencing elevated anx/dep & health status indicators. Pt has handled a major move of residence in the past 2+ months for herself & Dtr. [phone] Dtr is doing well & lives w/Autism.  Patient may benefit from cont'd ck-ins for mental health wellness.  Plan: Follow up with behavioral health clinician on : 2-3 wks on telehealth for 30 min Behavioral recommendations: None today Referral(s): [place] (In Clinic)  I discussed the assessment and treatment plan with the patient and/or parent/guardian. They were provided an opportunity to ask questions and all were answered. They agreed with the plan and demonstrated an understanding of the instructions.   They were advised to call back or seek an in-person evaluation if the symptoms worsen or if the condition fails to improve as anticipated.  Donnetta Hutching, LMFT

## 2021-04-22 DIAGNOSIS — F4310 Post-traumatic stress disorder, unspecified: Secondary | ICD-10-CM | POA: Diagnosis not present

## 2021-04-22 DIAGNOSIS — F331 Major depressive disorder, recurrent, moderate: Secondary | ICD-10-CM | POA: Diagnosis not present

## 2021-04-22 DIAGNOSIS — F411 Generalized anxiety disorder: Secondary | ICD-10-CM | POA: Diagnosis not present

## 2021-04-24 DIAGNOSIS — G4733 Obstructive sleep apnea (adult) (pediatric): Secondary | ICD-10-CM | POA: Diagnosis not present

## 2021-04-25 ENCOUNTER — Telehealth: Payer: Self-pay | Admitting: *Deleted

## 2021-04-25 NOTE — Telephone Encounter (Signed)
   Pre-operative Risk Assessment    Patient Name: [name]  DOB: 07/19/1965 MRN: [id number]       Request for Surgical Clearance   Procedure:   RIGHT CARPAL TUNNEL RELEASE   Date of Surgery: Clearance TBD                               DR. DANIEL CAS  Surgeon:  DR. DANIEL CAFFREY Surgeon's Group or Practice Name:  [facility] Phone number:  [phone] Fax number:  [phone] ATTN: [id number]   Type of Clearance Requested: - Medical    Type of Anesthesia:   CHOICE   Additional requests/questions:   Jiles Prows   04/25/2021, 4:27 PM

## 2021-04-26 NOTE — Telephone Encounter (Signed)
Attempted to call patient to complete pre-op risk assessment but patient did not answer. Voicemail was full so unable to leave message.  Darreld Mclean, PA-C 04/26/2021 11:58 AM Pager: [phone]

## 2021-05-06 NOTE — Telephone Encounter (Signed)
Left message to call back at [phone] on 05/06/21.  Patient is call back.

## 2021-05-12 ENCOUNTER — Other Ambulatory Visit: Payer: Self-pay | Admitting: Student

## 2021-05-12 ENCOUNTER — Ambulatory Visit: Payer: Medicare Other | Admitting: Behavioral Health

## 2021-05-12 ENCOUNTER — Telehealth: Payer: Self-pay | Admitting: Behavioral Health

## 2021-05-12 DIAGNOSIS — M79641 Pain in right hand: Secondary | ICD-10-CM

## 2021-05-12 DIAGNOSIS — M25532 Pain in left wrist: Secondary | ICD-10-CM

## 2021-05-12 DIAGNOSIS — M25531 Pain in right wrist: Secondary | ICD-10-CM

## 2021-05-12 DIAGNOSIS — M79642 Pain in left hand: Secondary | ICD-10-CM

## 2021-05-12 NOTE — Telephone Encounter (Signed)
Unable to lv msg for Pt re: IBH telehealth visit today. Pt Mbox is full.  Dr. Theodis Shove

## 2021-05-13 NOTE — Telephone Encounter (Signed)
   Name: [name]  DOB: 10-Jul-1965  MRN: [id number]   Primary Cardiologist: Freada Bergeron, MD  Chart reviewed as part of pre-operative protocol coverage. Patient was contacted 05/13/2021 in reference to pre-operative risk assessment for pending surgery as outlined below.  [name] was last seen on 11/2020 by Dr. Johney Frame. Previously extensively evaluated for chest pain with prior reassuring testing with coronary CTA 2012 with calcium score of zero, reported NSTEMI in [facility] 2013 with normal coronaries, and normal stress test 08/2020; echo 08/2020 EF 70-75%, normal RV, mild AI. Recently cleared for unrelated procedure in October per phone notes. Not on any blood thinners to hold. RCRI 0.4% indicating overall low risk. I reached out to patient for update on how she is doing. The patient affirms she has been doing well without any new cardiac symptoms/changes from last visit. Therefore, based on ACC/AHA guidelines, the patient would be at acceptable risk for the planned procedure without further cardiovascular testing. The patient was advised that if she develops new symptoms prior to surgery to contact our office to arrange for a follow-up visit, and she verbalized understanding. I will route this recommendation to the requesting party via Epic fax function and remove from pre-op pool. Please call with questions.  Charlie Pitter, PA-C 05/13/2021, 8:04 AM

## 2021-05-24 DIAGNOSIS — G4733 Obstructive sleep apnea (adult) (pediatric): Secondary | ICD-10-CM | POA: Diagnosis not present

## 2021-06-16 ENCOUNTER — Other Ambulatory Visit: Payer: Self-pay | Admitting: *Deleted

## 2021-06-16 MED ORDER — CARVEDILOL 25 MG PO TABS: 25.0000 mg | ORAL_TABLET | Freq: Two times a day (BID) | ORAL | 11 refills | Status: DC

## 2021-06-22 DIAGNOSIS — G4733 Obstructive sleep apnea (adult) (pediatric): Secondary | ICD-10-CM | POA: Diagnosis not present

## 2021-06-25 ENCOUNTER — Other Ambulatory Visit: Payer: Self-pay | Admitting: Student

## 2021-06-27 ENCOUNTER — Encounter: Payer: Self-pay | Admitting: Neurology

## 2021-06-27 ENCOUNTER — Ambulatory Visit: Payer: Medicare Other | Admitting: Neurology

## 2021-07-08 DIAGNOSIS — L732 Hidradenitis suppurativa: Secondary | ICD-10-CM | POA: Diagnosis not present

## 2021-08-01 ENCOUNTER — Encounter: Payer: Self-pay | Admitting: *Deleted

## 2021-08-05 DIAGNOSIS — J4550 Severe persistent asthma, uncomplicated: Secondary | ICD-10-CM | POA: Diagnosis not present

## 2021-08-05 DIAGNOSIS — R202 Paresthesia of skin: Secondary | ICD-10-CM | POA: Diagnosis not present

## 2021-08-05 DIAGNOSIS — R600 Localized edema: Secondary | ICD-10-CM | POA: Diagnosis not present

## 2021-08-09 ENCOUNTER — Telehealth: Payer: Self-pay | Admitting: Cardiology

## 2021-08-09 DIAGNOSIS — E039 Hypothyroidism, unspecified: Secondary | ICD-10-CM | POA: Diagnosis not present

## 2021-08-09 DIAGNOSIS — Z881 Allergy status to other antibiotic agents status: Secondary | ICD-10-CM | POA: Diagnosis not present

## 2021-08-09 DIAGNOSIS — Z79899 Other long term (current) drug therapy: Secondary | ICD-10-CM | POA: Diagnosis not present

## 2021-08-09 DIAGNOSIS — I10 Essential (primary) hypertension: Secondary | ICD-10-CM | POA: Diagnosis not present

## 2021-08-09 DIAGNOSIS — R0602 Shortness of breath: Secondary | ICD-10-CM | POA: Diagnosis not present

## 2021-08-09 DIAGNOSIS — Z888 Allergy status to other drugs, medicaments and biological substances status: Secondary | ICD-10-CM | POA: Diagnosis not present

## 2021-08-09 DIAGNOSIS — I081 Rheumatic disorders of both mitral and tricuspid valves: Secondary | ICD-10-CM | POA: Diagnosis not present

## 2021-08-09 DIAGNOSIS — Z886 Allergy status to analgesic agent status: Secondary | ICD-10-CM | POA: Diagnosis not present

## 2021-08-09 DIAGNOSIS — J45909 Unspecified asthma, uncomplicated: Secondary | ICD-10-CM | POA: Diagnosis not present

## 2021-08-09 DIAGNOSIS — I249 Acute ischemic heart disease, unspecified: Secondary | ICD-10-CM | POA: Diagnosis not present

## 2021-08-09 DIAGNOSIS — R778 Other specified abnormalities of plasma proteins: Secondary | ICD-10-CM | POA: Diagnosis not present

## 2021-08-09 DIAGNOSIS — K219 Gastro-esophageal reflux disease without esophagitis: Secondary | ICD-10-CM | POA: Diagnosis not present

## 2021-08-09 DIAGNOSIS — R0609 Other forms of dyspnea: Secondary | ICD-10-CM | POA: Diagnosis not present

## 2021-08-09 DIAGNOSIS — R202 Paresthesia of skin: Secondary | ICD-10-CM | POA: Diagnosis not present

## 2021-08-09 DIAGNOSIS — I201 Angina pectoris with documented spasm: Secondary | ICD-10-CM | POA: Diagnosis not present

## 2021-08-09 DIAGNOSIS — R0789 Other chest pain: Secondary | ICD-10-CM | POA: Diagnosis not present

## 2021-08-09 DIAGNOSIS — Z7951 Long term (current) use of inhaled steroids: Secondary | ICD-10-CM | POA: Diagnosis not present

## 2021-08-09 DIAGNOSIS — Z9884 Bariatric surgery status: Secondary | ICD-10-CM | POA: Diagnosis not present

## 2021-08-09 DIAGNOSIS — Z91040 Latex allergy status: Secondary | ICD-10-CM | POA: Diagnosis not present

## 2021-08-09 DIAGNOSIS — I119 Hypertensive heart disease without heart failure: Secondary | ICD-10-CM | POA: Diagnosis not present

## 2021-08-09 DIAGNOSIS — Z952 Presence of prosthetic heart valve: Secondary | ICD-10-CM | POA: Diagnosis not present

## 2021-08-09 NOTE — Telephone Encounter (Signed)
Call transferred to me for CP.  The patient has been experiencing CP to some degree continuously since Friday.  Prior to that she has been SOB and noticing swelling in her hands and right leg.  Notes weight gain of 10-20 pounds in the last 2 weeks.  States her current weight is 192.  She does not weigh consistently. ? ?Pt reports she is wearing a ntg patch and also using SL nitro to get relief from chest pain.  Together the patches and SL ntg make the pain not as severe but it has not been completed relieved.   It worsens with walking from bedroom to bathroom or kitchen as does the SOB.     ? ?She states she is not out of any of her medications and has been taking them.  She is in [place], [place].  She is overdue for follow up with Dr. Johney Frame.  I adv the patient to go to the ER in [place] at this time for these symptoms.  I scheduled her appointment with Dr. Johney Frame for 08/26/21.  The patient is grateful for information provided. ?

## 2021-08-09 NOTE — Telephone Encounter (Signed)
Pt c/o of Chest Pain: STAT if CP now or developed within 24 hours ? ?1. Are you having CP right now? YES ? ?2. Are you experiencing any other symptoms (ex. SOB, nausea, vomiting, sweating)? Nausea ? ?3. How long have you been experiencing CP? friday ? ?4. Is your CP continuous or coming and going? Continuous ? ?5. Have you taken Nitroglycerin? Yes ? ?Pt c/o swelling: STAT is pt has developed SOB within 24 hours ? ?How much weight have you gained and in what time span? yes ? ?If swelling, where is the swelling located? Hands and swelling in right leg ? ?Are you currently taking a fluid pill? no ? ?Are you currently SOB? Yes all the time ? ?Do you have a log of your daily weights (if so, list)?   ? ?Have you gained 3 pounds in a day or 5 pounds in a week?   ? ?Have you traveled recently? no ? ?? ? ?

## 2021-08-22 ENCOUNTER — Encounter: Payer: Medicaid Other | Admitting: Internal Medicine

## 2021-08-22 ENCOUNTER — Encounter: Payer: Self-pay | Admitting: Internal Medicine

## 2021-08-22 NOTE — Progress Notes (Deleted)
HFU 3/7-3/9 ?SOB for 2 weeks then developed cp. Initial troponin elevated at 267. Cardiac cath 3/8 with no CAD. Echo showed EF 55-60% with normal wall motion, normal diastolic fx, mild MR, mild TR. Differentials include mild myocarditis vs coronary vasospasm ? ?abnormal stress test in 2008 that was followed by normal cardiac catheterization. Coronary CT angiogram (04/2011): calcium score 0, difficult study with gating artifact, mid RCA nonevaluatable but no stenosis elsewhere in the coronaries. Echo (11/12): EF 55-60%, mild LV hypertrophy, mild MR.  NSTEMI in [place] in 5/13 with elevated troponin but LHC showed normal coronaries.  ? ?F/u with Dr. Vallarie Mare 3/29 with interventional. She was started on Imdur 30 mg qd ?Does she also need to follow-up with Dr. Johney Frame? 3/24    ?Medications: lisinopril 30 mg, Coreg 25 BID, Spiro 25 mg, IMDUR ADDED, ASA ? ?Not on statin because of muscle pain. Tried simvastatin before. LDL at 61 ? ? ? ?Alk phos remains mildly elevated, imprvoed from 146 to 108 woth votamin d supplementation. ?Vit D low ? ?Pap needed? ASCUS -ve for HPV 12/2018 ?-schedule follow-up ? ?

## 2021-08-23 ENCOUNTER — Encounter: Payer: Self-pay | Admitting: Student

## 2021-08-23 ENCOUNTER — Ambulatory Visit (INDEPENDENT_AMBULATORY_CARE_PROVIDER_SITE_OTHER): Payer: Medicare Other | Admitting: Student

## 2021-08-23 VITALS — BP 143/98 | HR 83 | Temp 97.4°F | Ht 65.0 in | Wt 205.7 lb

## 2021-08-23 DIAGNOSIS — Z79899 Other long term (current) drug therapy: Secondary | ICD-10-CM

## 2021-08-23 DIAGNOSIS — R002 Palpitations: Secondary | ICD-10-CM

## 2021-08-23 DIAGNOSIS — G8929 Other chronic pain: Secondary | ICD-10-CM

## 2021-08-23 DIAGNOSIS — M7989 Other specified soft tissue disorders: Secondary | ICD-10-CM

## 2021-08-23 DIAGNOSIS — R079 Chest pain, unspecified: Secondary | ICD-10-CM | POA: Diagnosis not present

## 2021-08-23 DIAGNOSIS — I110 Hypertensive heart disease with heart failure: Secondary | ICD-10-CM | POA: Diagnosis not present

## 2021-08-23 DIAGNOSIS — I5032 Chronic diastolic (congestive) heart failure: Secondary | ICD-10-CM | POA: Diagnosis not present

## 2021-08-23 DIAGNOSIS — I201 Angina pectoris with documented spasm: Secondary | ICD-10-CM

## 2021-08-23 DIAGNOSIS — I10 Essential (primary) hypertension: Secondary | ICD-10-CM | POA: Diagnosis not present

## 2021-08-23 DIAGNOSIS — E785 Hyperlipidemia, unspecified: Secondary | ICD-10-CM

## 2021-08-23 DIAGNOSIS — Z9689 Presence of other specified functional implants: Secondary | ICD-10-CM

## 2021-08-23 LAB — D-DIMER, QUANTITATIVE: D-Dimer, Quant: 0.27 ug/mL-FEU (ref 0.00–0.50)

## 2021-08-23 MED ORDER — SPIRONOLACTONE 25 MG PO TABS: 25.0000 mg | ORAL_TABLET | Freq: Every day | ORAL | 3 refills | Status: DC

## 2021-08-23 NOTE — Patient Instructions (Signed)
[name], it was a pleasure seeing you today! ? ?Today we discussed: ?- I am going to get lab work today. ? ?- I am getting an ultrasound of your leg to see if there are any clots. ? ?- I have re-filled spironolactone.  ? ?I have ordered the following labs today: ? ? ?Lab Orders    ?     BMP8+Anion Gap    ?     D-dimer, quantitative     ? ?Tests ordered today: ? ?Lower extremity ultrasound, left ? ? ?Follow-up:  1 month   ? ?Please make sure to arrive 15 minutes prior to your next appointment. If you arrive late, you may be asked to reschedule.  ? ?We look forward to seeing you next time. Please call our clinic at [phone] if you have any questions or concerns. The best time to call is Monday-Friday from 9am-4pm, but there is someone available 24/7. If after hours or the weekend, call the main hospital number and ask for the Internal Medicine Resident On-Call. If you need medication refills, please notify your pharmacy one week in advance and they will send [place] a request. ? ?Thank you for letting us take part in your care. Wishing you the best! ? ?Thank you, ?Sanjuan Dame, MD ? ?
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
## 2021-08-23 NOTE — Progress Notes (Signed)
[facility])  ?                                          [facility] Quality Pharmacy Team  ?                                      Statin Quality Measure Assessment ?  ? ?08/23/2021 ? ?[name] ?03/18/1966 ?[id number] ? ?Per review of chart and payor information, this patient has been flagged for non-adherence to the following CMS Quality Measure:  ? '[]'$  Statin Use in Persons with Diabetes ? '[x]'$  Statin Use in Persons with Cardiovascular Disease - recent ACS event earlier this month ? ?The 10-year ASCVD risk score (Arnett DK, et al., 2019) is: 4.2% ?  Values used to calculate the score: ?    Age: 56 years ?    Sex: Female ?    Is Non-Hispanic African American: Yes ?    Diabetic: No ?    Tobacco smoker: Yes ?    Systolic Blood Pressure: 562 mmHg ?    Is BP treated: Yes ?    HDL Cholesterol: 61 mg/dL ?    Total Cholesterol: 133 mg/dL ? ? ?Patient was previously on pravastatin 40 mg and simvastatin 10 mg. Simvastatin caused swelling and muscle pain. No antihyperlipidemic on file.  Last lipid panel checked on 08/10/2021 resulted with a LDL of 61. If clinically appropriate, please consider assessing statin re-challenge and associating exclusion code (see options below) at the next scheduled appointment on 08/23/2021 at 3:15 pm.  ? ?Please consider ONE of the following recommendations:  ? ?Initiate high intensity statin Atorvastatin '40mg'$  once daily, #90, 3 refills  ? Rosuvastatin '20mg'$  once daily, #90, 3 refills  ?  ?Initiate moderate intensity  ?        statin with reduced frequency if prior  ?        statin intolerance 1x weekly, #13, 3 refills  ? 2x weekly, #26, 3 refills  ? 3x weekly, #39, 3 refills  ? ?Code for past statin intolerance or other exclusions (required annually) ? Drug Induced Myopathy G72.0  ? Myositis, unspecified M60.9  ? Rhabdomyolysis M62.82  ? Cirrhosis of liver K74.69  ? Biliary cirrhosis, unspecified K74.5  ? Abnormal blood glucose - for SUPD ONLY  R73.09  ? Prediabetes - for SUPD ONLY  R73.03  ? Polycystic ovarian syndrome E28.2  ? Adverse effect of antihyperlipidemic and antiarteriosclerotic drugs, initial encounter T46.6X5A  ? ?Thank you for your time, ? ?Kristeen Miss, PharmD ?Clinical Pharmacist ?[place] ?Cell: [phone]  ? ?

## 2021-08-24 DIAGNOSIS — M7989 Other specified soft tissue disorders: Secondary | ICD-10-CM

## 2021-08-24 DIAGNOSIS — I201 Angina pectoris with documented spasm: Secondary | ICD-10-CM | POA: Insufficient documentation

## 2021-08-24 HISTORY — DX: Other specified soft tissue disorders: M79.89

## 2021-08-24 LAB — BMP8+ANION GAP
Anion Gap: 12 mmol/L (ref 10.0–18.0)
BUN/Creatinine Ratio: 18 (ref 9–23)
BUN: 15 mg/dL (ref 6–24)
CO2: 21 mmol/L (ref 20–29)
Calcium: 8.8 mg/dL (ref 8.7–10.2)
Chloride: 108 mmol/L — ABNORMAL HIGH (ref 96–106)
Creatinine, Ser: 0.82 mg/dL (ref 0.57–1.00)
Glucose: 81 mg/dL (ref 70–99)
Potassium: 4 mmol/L (ref 3.5–5.2)
Sodium: 141 mmol/L (ref 134–144)
eGFR: 83 mL/min/{1.73_m2} (ref >59)

## 2021-08-24 MED ORDER — NITROGLYCERIN 0.4 MG SL SUBL: SUBLINGUAL_TABLET | SUBLINGUAL | 6 refills | Status: DC

## 2021-08-24 MED ORDER — ISOSORBIDE MONONITRATE ER 30 MG PO TB24: 30.0000 mg | ORAL_TABLET | Freq: Every day | ORAL | 3 refills | Status: DC

## 2021-08-24 NOTE — Assessment & Plan Note (Addendum)
[name] is presenting today to discuss recent hospitalization for chest pain. She reports initially thought her symptoms were due to indigestion, but given she had associated dyspnea she went to the hospital. She was found to have mildly elevated troponins and was determined appropriate to undergo LHC. Per chart review, no evidence of coronary artery disease. Symptoms were thought to be 2/2 coronary vasospasms and patient was discharged with Imdur.  ? ?Since discharge, [name] reports she has continued to have chest pain. Describes mid-sternal chest pain that sometimes radiates to the right shoulder/arm. This is associated with dyspnea, especially with exertion. She reports the chest pain occurs both with exertion and at rest. She has needed to use nitroglycerin three times since discharge. Each time, the chest pain improved but did not dissipate. She has been taking all of her medications except for spironolactone. She has not had issues with Imdur, denying lightheadedness, dizziness, syncopal episodes. Reports she has an appointment with Dr. Johney Frame later this week. ? ?Given patient had active chest pain in clinic today, ECG was performed. This revealed normal sinus rhythm with mild, non-specific ST changes in anterior leads, no changes from last visible ECG in 04/2021. Chest pain appears atypical, being reproducible on palpation and occurring at rest and with exertion. If coronary vasospasm is causing symptoms, likely could benefit from calcium channel blocker given continued chest pain. However, given she sees Dr. Johney Frame later this week, we will defer to her for any further medication changes needed.  ? ?- Aspirin '81mg'$  daily ?- Carvedilol '25mg'$  twice daily ?- Imdur '30mg'$  daily ?- Nitroglycerin 0.'4mg'$  SL as needed ?- Follow-up with cardiology later this week ?- Repeat BMP today ?
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
## 2021-08-24 NOTE — Assessment & Plan Note (Signed)
Patient is presenting today with recent left leg swelling. She reports symptoms have worsened over the last week. Does have some generalized leg pain, no association with ambulation. Denies inciting trauma or injury to the leg. She has not had recent surgeries, but was recently hospitalized for a few days.  ? ?On examination, patient's entire left leg is circumferentially larger than the right. She does have calf tenderness on the left and the right. D-dimer was negative during her most recent hospitalization. She currently is sating well on room air, not tachycardic. No previous clots. Current Wells score is 2, will obtain repeat D-dimer and lower extremity ultrasound. Patient without inciting event, but can consider muscle strain if no DVT. No signs of infection or signs of chronic venous insufficiency. She is not experiencing claudication symptoms, less likely PAD or May-Thurner.  ? ?- D-dimer today ?- Follow-up lower extremity ultrasound ?

## 2021-08-24 NOTE — Assessment & Plan Note (Signed)
BP Readings from Last 3 Encounters:  ?08/23/21 (!) 143/98  ?04/08/21 (!) 143/84  ?04/04/21 123/90  ? ?Patient has not been taking spironolactone. Otherwise, reports compliance with her other medications. Today will refill spironolactone and continue with other medications as prescribed. She is to follow-up with her cardiologist in a few days. ? ?- Lisinopril '30mg'$  daily ?- Carvedilol '25mg'$  twice daily ?- Spironolactone '25mg'$  daily ?- Imdur '30mg'$  daily ?- BMP today ?- Return to cardiology later this week ?- Consider secondary hypertension work-up at next visit ?

## 2021-08-25 ENCOUNTER — Encounter: Payer: Self-pay | Admitting: Student

## 2021-08-26 ENCOUNTER — Encounter: Payer: Self-pay | Admitting: Cardiology

## 2021-08-26 ENCOUNTER — Ambulatory Visit (INDEPENDENT_AMBULATORY_CARE_PROVIDER_SITE_OTHER): Payer: Medicare Other | Admitting: Cardiology

## 2021-08-26 ENCOUNTER — Telehealth: Payer: Self-pay | Admitting: Cardiology

## 2021-08-26 ENCOUNTER — Other Ambulatory Visit: Payer: Self-pay

## 2021-08-26 VITALS — BP 152/82 | HR 73 | Ht 65.0 in | Wt 204.0 lb

## 2021-08-26 DIAGNOSIS — G4733 Obstructive sleep apnea (adult) (pediatric): Secondary | ICD-10-CM | POA: Diagnosis not present

## 2021-08-26 DIAGNOSIS — E785 Hyperlipidemia, unspecified: Secondary | ICD-10-CM

## 2021-08-26 DIAGNOSIS — Z79899 Other long term (current) drug therapy: Secondary | ICD-10-CM

## 2021-08-26 DIAGNOSIS — R079 Chest pain, unspecified: Secondary | ICD-10-CM | POA: Diagnosis not present

## 2021-08-26 DIAGNOSIS — U099 Post covid-19 condition, unspecified: Secondary | ICD-10-CM

## 2021-08-26 DIAGNOSIS — I201 Angina pectoris with documented spasm: Secondary | ICD-10-CM

## 2021-08-26 DIAGNOSIS — I351 Nonrheumatic aortic (valve) insufficiency: Secondary | ICD-10-CM | POA: Diagnosis not present

## 2021-08-26 DIAGNOSIS — I10 Essential (primary) hypertension: Secondary | ICD-10-CM

## 2021-08-26 DIAGNOSIS — I5189 Other ill-defined heart diseases: Secondary | ICD-10-CM | POA: Diagnosis not present

## 2021-08-26 DIAGNOSIS — G8929 Other chronic pain: Secondary | ICD-10-CM

## 2021-08-26 MED ORDER — ISOSORBIDE MONONITRATE ER 60 MG PO TB24: 60.0000 mg | ORAL_TABLET | Freq: Every day | ORAL | 2 refills | Status: DC

## 2021-08-26 MED ORDER — DILTIAZEM HCL 30 MG PO TABS: 30.0000 mg | ORAL_TABLET | Freq: Two times a day (BID) | ORAL | 1 refills | Status: DC

## 2021-08-26 NOTE — Progress Notes (Signed)
Internal Medicine Clinic Attending ? ?Case discussed with Dr. Braswell  At the time of the visit.  We reviewed the resident?s history and exam and pertinent patient test results.  I agree with the assessment, diagnosis, and plan of care documented in the resident?s note.  ?

## 2021-08-26 NOTE — Telephone Encounter (Signed)
Pt c/o medication issue: ? ?1. Name of Medication: ? isosorbide mononitrate (IMDUR) 30 MG 24 hr tablet ? ?2. How are you currently taking this medication (dosage and times per day)? 1 tablet daily ? ?3. Are you having a reaction (difficulty breathing--STAT)? Yes ? ?4. What is your medication issue? Medication has caused blurred vision since she was put on it in the hospital. Just realized this was the cause today. Concerned with [place] doubling the dosage today. ?

## 2021-08-26 NOTE — Patient Instructions (Signed)
Medication Instructions:  ? ?START TAKING DILTIAZEM (CARDIZEM) 30 MG BY MOUTH TWICE DAILY--TAKE SECOND DOSE AROUND 12 PM -2 PM ? ?INCREASE YOUR IMDUR TO 60 MG BY MOUTH DAILY ? ?*If you need a refill on your cardiac medications before your next appointment, please call your pharmacy* ? ? ?You have been referred to [place] SEE OUR PHARMACIST FOR BP MANAGEMENT--BRING YOUR BLOOD PRESSURE RECORDINGS TO THIS VISIT ? ? ? ?Follow-Up: ? ?3-6 MONTHS WITH DR. Johney Frame  ? ?:1}  ?Other Instructions ? ?TAKE AND RECORD YOUR BLOOD PRESSURES AND BRING THOSE RECORDINGS TO YOUR BP CLINIC APPOINTMENT ? ?

## 2021-08-26 NOTE — Telephone Encounter (Signed)
Will route this message to Dr. Johney Frame to further review and advise on medication concern.  Will follow-up with the pt accordingly thereafter.  ?

## 2021-08-27 NOTE — Telephone Encounter (Signed)
Okay. Is she still having the blurry vision with the isosorbide or did she end up tolerating the '30mg'$  dose? If she was tolerating the '30mg'$ , we can continue this dosage. If not, we can decrease to '15mg'$  daily and see how she does. ? ?Because her blood pressure is significantly elevated, would recommend increasing her lisinopril. Is she currently taking '30mg'$  daily? If so, we will increase to '40mg'$  daily. ?

## 2021-08-29 MED ORDER — LISINOPRIL 40 MG PO TABS
40.0000 mg | ORAL_TABLET | Freq: Every day | ORAL | 2 refills | Status: DC
Start: 2021-08-29 — End: 2021-10-18

## 2021-08-29 MED ORDER — ISOSORBIDE MONONITRATE ER 30 MG PO TB24: 15.0000 mg | ORAL_TABLET | Freq: Every day | ORAL | 3 refills | Status: DC

## 2021-08-29 NOTE — Telephone Encounter (Signed)
Left the pt a message to call the office back to endorse medication recommendations per Dr. Johney Frame.  ?

## 2021-08-29 NOTE — Telephone Encounter (Signed)
Spoke back with Dr. Johney Frame about lisinopril dosing.  Being we are cutting back on the Imdur to 15 mg po daily, she will need good BP coverage, so we will increase her lisinopril to 40 mg po daily. ? ?Endorsed to the pt she should now start taking Imdur 15 mg po daily and increase her lisinopril to 40 mg po daily. ?Confirmed the pharmacy of choice with the pt.  ?Advised her to continue monitoring and logging her pressures at home.  ?Pt verbalized understanding and agrees with this plan. ?

## 2021-08-29 NOTE — Telephone Encounter (Signed)
Attempted to call the pt back, and still no answer. ?

## 2021-08-29 NOTE — Telephone Encounter (Signed)
We should increase the lisinopril to '40mg'$  daily since we are cutting down on the imdur. Will need repeat BMET in 7-10 days to ensure her electrolytes and kidney function stay stable.  ?

## 2021-08-29 NOTE — Telephone Encounter (Signed)
She will come in on 09/08/21 for repeat BMET to reassess renal function and electrolytes, with med changes.  ?Pt verbalized understanding and agrees with this plan. ?

## 2021-08-29 NOTE — Addendum Note (Signed)
Addended by: Nuala Alpha on: 08/29/2021 03:10 PM ? ? Modules accepted: Orders ? ?

## 2021-08-29 NOTE — Telephone Encounter (Signed)
Spoke with the pt.  She states that Imdur 30 mg po daily is causing her blurry vision and she would like to try decreased dose of Imdur 15 mg po daily.  ? ?Clarified dosing of lisinopril with the pt.  Pt states she is currently taking lisinopril 20 mg po daily NOT 30 mg daily.  Informed the pt that I will speak with Dr. Johney Frame about new dosing of lisinopril, being she is only taking 20 mg daily and NOT 30 mg daily.  Pt aware I need to clarify if she wants to increase her lisinopril now to 30 mg or still 40 mg po daily.  Informed the pt that I will call her back in the next few mins, with lisinopril dosing.  Pt verbalized understanding and agrees with this plan.  ?

## 2021-09-08 ENCOUNTER — Other Ambulatory Visit: Payer: Medicare Other

## 2021-09-08 ENCOUNTER — Ambulatory Visit (HOSPITAL_COMMUNITY): Admission: RE | Admit: 2021-09-08 | Payer: Medicare Other | Source: Ambulatory Visit

## 2021-09-08 ENCOUNTER — Ambulatory Visit (HOSPITAL_COMMUNITY)
Admission: RE | Admit: 2021-09-08 | Discharge: 2021-09-08 | Disposition: A | Discharge status: Home / Self Care | Payer: Medicare Other | Source: Ambulatory Visit | Attending: Internal Medicine | Admitting: Internal Medicine

## 2021-09-08 DIAGNOSIS — I10 Essential (primary) hypertension: Secondary | ICD-10-CM

## 2021-09-08 DIAGNOSIS — Z79899 Other long term (current) drug therapy: Secondary | ICD-10-CM

## 2021-09-08 DIAGNOSIS — M7989 Other specified soft tissue disorders: Secondary | ICD-10-CM

## 2021-09-08 NOTE — Progress Notes (Signed)
Lower extremity venous has been completed.  ? ?Preliminary results in CV Proc.  ? ?[name] ?09/08/2021 1:36 PM    ?

## 2021-09-12 DIAGNOSIS — M170 Bilateral primary osteoarthritis of knee: Secondary | ICD-10-CM | POA: Diagnosis not present

## 2021-09-19 ENCOUNTER — Telehealth: Payer: Self-pay | Admitting: Behavioral Health

## 2021-09-19 ENCOUNTER — Institutional Professional Consult (permissible substitution): Payer: Medicare Other | Admitting: Behavioral Health

## 2021-09-19 DIAGNOSIS — M542 Cervicalgia: Secondary | ICD-10-CM | POA: Diagnosis not present

## 2021-09-19 DIAGNOSIS — M545 Low back pain, unspecified: Secondary | ICD-10-CM | POA: Diagnosis not present

## 2021-09-19 DIAGNOSIS — G5601 Carpal tunnel syndrome, right upper limb: Secondary | ICD-10-CM | POA: Diagnosis not present

## 2021-09-19 DIAGNOSIS — G894 Chronic pain syndrome: Secondary | ICD-10-CM | POA: Diagnosis not present

## 2021-09-19 NOTE — Telephone Encounter (Signed)
Pt requesting an Interview for the purpose of her Intro to Clin [place] course. Discussed the necessity of Pt speaking to Prof as this Clinician cannot violate her HIPPA or in any way compromise future work we may do in Therapy. A paper disclosing my name, title, credentials might not be the best choice. Inquire w/your Prof of the other guidance she may provide for Clinicians not personally working w/you to explore for the Interview. Also, I am unsure I can spare the time currently. ? ?Pt acknowledged & agreed. ? ?Dr. Theodis Shove ? ? ?

## 2021-09-20 ENCOUNTER — Other Ambulatory Visit: Payer: Self-pay | Admitting: Internal Medicine

## 2021-09-20 DIAGNOSIS — M25531 Pain in right wrist: Secondary | ICD-10-CM

## 2021-09-20 DIAGNOSIS — M79641 Pain in right hand: Secondary | ICD-10-CM

## 2021-09-21 LAB — BASIC METABOLIC PANEL
BUN/Creatinine Ratio: 22 (ref 9–23)
BUN: 16 mg/dL (ref 6–24)
Calcium: 8.9 mg/dL (ref 8.7–10.2)
Chloride: 110 mmol/L — ABNORMAL HIGH (ref 96–106)
Creatinine, Ser: 0.74 mg/dL (ref 0.57–1.00)
Glucose: 85 mg/dL (ref 70–99)
Potassium: 4.4 mmol/L (ref 3.5–5.2)
Sodium: 142 mmol/L (ref 134–144)
eGFR: 94 mL/min/{1.73_m2} (ref >59)
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
## 2021-09-23 ENCOUNTER — Ambulatory Visit: Payer: Medicare Other

## 2021-09-25 DIAGNOSIS — G4733 Obstructive sleep apnea (adult) (pediatric): Secondary | ICD-10-CM | POA: Diagnosis not present

## 2021-10-03 ENCOUNTER — Other Ambulatory Visit: Payer: Self-pay | Admitting: Student

## 2021-10-07 DIAGNOSIS — L732 Hidradenitis suppurativa: Secondary | ICD-10-CM | POA: Diagnosis not present

## 2021-10-13 DIAGNOSIS — Z79899 Other long term (current) drug therapy: Secondary | ICD-10-CM | POA: Diagnosis not present

## 2021-10-13 DIAGNOSIS — S29011A Strain of muscle and tendon of front wall of thorax, initial encounter: Secondary | ICD-10-CM | POA: Diagnosis not present

## 2021-10-13 DIAGNOSIS — Z888 Allergy status to other drugs, medicaments and biological substances status: Secondary | ICD-10-CM | POA: Diagnosis not present

## 2021-10-13 DIAGNOSIS — R0789 Other chest pain: Secondary | ICD-10-CM | POA: Diagnosis not present

## 2021-10-13 DIAGNOSIS — Z91040 Latex allergy status: Secondary | ICD-10-CM | POA: Diagnosis not present

## 2021-10-13 DIAGNOSIS — R079 Chest pain, unspecified: Secondary | ICD-10-CM | POA: Diagnosis not present

## 2021-10-13 DIAGNOSIS — Z7951 Long term (current) use of inhaled steroids: Secondary | ICD-10-CM | POA: Diagnosis not present

## 2021-10-13 DIAGNOSIS — Z886 Allergy status to analgesic agent status: Secondary | ICD-10-CM | POA: Diagnosis not present

## 2021-10-13 DIAGNOSIS — Z881 Allergy status to other antibiotic agents status: Secondary | ICD-10-CM | POA: Diagnosis not present

## 2021-10-13 DIAGNOSIS — R1011 Right upper quadrant pain: Secondary | ICD-10-CM | POA: Diagnosis not present

## 2021-10-13 DIAGNOSIS — I509 Heart failure, unspecified: Secondary | ICD-10-CM | POA: Diagnosis not present

## 2021-10-13 DIAGNOSIS — Z7982 Long term (current) use of aspirin: Secondary | ICD-10-CM | POA: Diagnosis not present

## 2021-10-13 DIAGNOSIS — Z885 Allergy status to narcotic agent status: Secondary | ICD-10-CM | POA: Diagnosis not present

## 2021-10-18 ENCOUNTER — Ambulatory Visit (INDEPENDENT_AMBULATORY_CARE_PROVIDER_SITE_OTHER): Payer: Medicare Other | Admitting: Pharmacist

## 2021-10-18 VITALS — BP 140/90 | HR 81

## 2021-10-18 DIAGNOSIS — I10 Essential (primary) hypertension: Secondary | ICD-10-CM

## 2021-10-18 MED ORDER — IRBESARTAN 300 MG PO TABS: 300.0000 mg | ORAL_TABLET | Freq: Every day | ORAL | 3 refills | Status: DC

## 2021-10-18 NOTE — Patient Instructions (Addendum)
STOP taking lisinopril ?START taking irbesartan '300mg'$  daily ? ?Try checking your blood pressure when you start to feel flushed ?I will talk to Dr. Johney Frame about trying Ranexa ?Continue Imdur 15 mg once daily, spironolactone 25 mg once daily, carvedilol 25 mg twice daily, diltiazem 30 mg twice daily (take last dose before 2 pm)  ?Please call me at [phone] with any questions ?Continue checking blood pressure at home. ?Please bring your log and BP cuff to next visit. ?
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
## 2021-10-19 ENCOUNTER — Telehealth: Payer: Self-pay | Admitting: Pharmacist

## 2021-10-19 MED ORDER — RANOLAZINE ER 500 MG PO TB12: 500.0000 mg | ORAL_TABLET | Freq: Two times a day (BID) | ORAL | 3 refills | Status: DC

## 2021-10-19 NOTE — Telephone Encounter (Signed)
Spoke with Dr. Johney Frame. She agrees with trial of Ranexa. Stop Imdur and start Ranexa '500mg'$  BID.  ?Called patient and reviewed changes. ?

## 2021-10-28 ENCOUNTER — Other Ambulatory Visit: Payer: Self-pay

## 2021-10-28 MED ORDER — CARVEDILOL 25 MG PO TABS: 25.0000 mg | ORAL_TABLET | Freq: Two times a day (BID) | ORAL | 11 refills | Status: DC

## 2021-11-08 DIAGNOSIS — M545 Low back pain, unspecified: Secondary | ICD-10-CM | POA: Diagnosis not present

## 2021-11-08 DIAGNOSIS — Z79899 Other long term (current) drug therapy: Secondary | ICD-10-CM | POA: Diagnosis not present

## 2021-11-08 DIAGNOSIS — M5416 Radiculopathy, lumbar region: Secondary | ICD-10-CM | POA: Diagnosis not present

## 2021-11-08 DIAGNOSIS — M7918 Myalgia, other site: Secondary | ICD-10-CM | POA: Diagnosis not present

## 2021-11-11 ENCOUNTER — Ambulatory Visit: Payer: Medicare Other
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
## 2021-11-17 ENCOUNTER — Encounter: Payer: Self-pay | Admitting: Cardiology

## 2021-11-17 ENCOUNTER — Ambulatory Visit (INDEPENDENT_AMBULATORY_CARE_PROVIDER_SITE_OTHER): Payer: Medicare Other | Admitting: Cardiology

## 2021-11-17 VITALS — BP 140/90 | HR 82 | Ht 65.0 in | Wt 207.0 lb

## 2021-11-17 DIAGNOSIS — I351 Nonrheumatic aortic (valve) insufficiency: Secondary | ICD-10-CM

## 2021-11-17 DIAGNOSIS — U099 Post covid-19 condition, unspecified: Secondary | ICD-10-CM | POA: Diagnosis not present

## 2021-11-17 DIAGNOSIS — G8929 Other chronic pain: Secondary | ICD-10-CM | POA: Diagnosis not present

## 2021-11-17 DIAGNOSIS — I201 Angina pectoris with documented spasm: Secondary | ICD-10-CM | POA: Diagnosis not present

## 2021-11-17 DIAGNOSIS — I5032 Chronic diastolic (congestive) heart failure: Secondary | ICD-10-CM | POA: Diagnosis not present

## 2021-11-17 DIAGNOSIS — R079 Chest pain, unspecified: Secondary | ICD-10-CM | POA: Diagnosis not present

## 2021-11-17 DIAGNOSIS — I10 Essential (primary) hypertension: Secondary | ICD-10-CM | POA: Diagnosis not present

## 2021-11-17 DIAGNOSIS — G4733 Obstructive sleep apnea (adult) (pediatric): Secondary | ICD-10-CM

## 2021-11-17 MED ORDER — ISOSORBIDE MONONITRATE ER 60 MG PO TB24: 120.0000 mg | ORAL_TABLET | Freq: Every day | ORAL | 1 refills | Status: DC

## 2021-11-17 NOTE — Patient Instructions (Signed)
Medication Instructions:   STOP TAKING DILTIAZEM NOW  INCREASE YOUR ISOSORBIDE MONONITRATE TO 120 MG BY MOUTH DAILY  *If you need a refill on your cardiac medications before your next appointment, please call your pharmacy*    Follow-Up:  3 MONTHS WITH DR. Johney Frame    Important Information About Sugar

## 2021-11-22 ENCOUNTER — Telehealth: Payer: Self-pay | Admitting: *Deleted

## 2021-11-22 NOTE — Telephone Encounter (Addendum)
PA for Emgality '120mg'$  completed on covermymeds (key: [id number]). [id number] approved through 06/04/2022.  PA for Ubrelvy '50mg'$  completed on covermymeds (key: [id number]). [id number] approved through 06/04/2022.   Pharmacy coverage through [employer] [phone]).

## 2021-11-28 ENCOUNTER — Ambulatory Visit: Payer: Medicare Other | Admitting: Neurology

## 2021-11-28 ENCOUNTER — Telehealth: Payer: Self-pay | Admitting: Neurology

## 2021-11-28 ENCOUNTER — Encounter: Payer: Self-pay | Admitting: Neurology

## 2021-11-28 NOTE — Telephone Encounter (Signed)
Pt request refill for UBRELVY 50 MG TABS at [id number]  Scheduled appt on  02/14/22

## 2021-11-29 ENCOUNTER — Telehealth: Payer: Self-pay

## 2021-11-29 DIAGNOSIS — M7918 Myalgia, other site: Secondary | ICD-10-CM | POA: Diagnosis not present

## 2021-11-29 DIAGNOSIS — M5416 Radiculopathy, lumbar region: Secondary | ICD-10-CM | POA: Diagnosis not present

## 2021-11-29 DIAGNOSIS — Z79899 Other long term (current) drug therapy: Secondary | ICD-10-CM | POA: Diagnosis not present

## 2021-11-29 NOTE — Telephone Encounter (Signed)
The patient was last seen July 2022 and failed to follow up with our practice (no showed multiple appts). Dr. Terrace Arabia has dismissed her from our care. She is already established with Dr. Tera Helper, neurologist at [facility]. She will need to discuss her VNS needs that her. I called the patient and she verbalized understanding.

## 2021-12-02 ENCOUNTER — Ambulatory Visit: Payer: Medicare Other

## 2021-12-05 DIAGNOSIS — R208 Other disturbances of skin sensation: Secondary | ICD-10-CM | POA: Diagnosis not present

## 2021-12-22 ENCOUNTER — Ambulatory Visit: Payer: Medicare Other
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
## 2021-12-25 DIAGNOSIS — G4733 Obstructive sleep apnea (adult) (pediatric): Secondary | ICD-10-CM | POA: Diagnosis not present

## 2022-01-04 DIAGNOSIS — M25562 Pain in left knee: Secondary | ICD-10-CM | POA: Diagnosis not present

## 2022-01-04 DIAGNOSIS — M5459 Other low back pain: Secondary | ICD-10-CM | POA: Diagnosis not present

## 2022-01-04 DIAGNOSIS — R2689 Other abnormalities of gait and mobility: Secondary | ICD-10-CM | POA: Diagnosis not present

## 2022-01-04 DIAGNOSIS — M542 Cervicalgia: Secondary | ICD-10-CM | POA: Diagnosis not present

## 2022-01-05 DIAGNOSIS — M5126 Other intervertebral disc displacement, lumbar region: Secondary | ICD-10-CM | POA: Diagnosis not present

## 2022-01-05 DIAGNOSIS — M542 Cervicalgia: Secondary | ICD-10-CM | POA: Diagnosis not present

## 2022-01-05 DIAGNOSIS — M47816 Spondylosis without myelopathy or radiculopathy, lumbar region: Secondary | ICD-10-CM | POA: Diagnosis not present

## 2022-01-05 DIAGNOSIS — M48061 Spinal stenosis, lumbar region without neurogenic claudication: Secondary | ICD-10-CM | POA: Diagnosis not present

## 2022-01-05 DIAGNOSIS — M4802 Spinal stenosis, cervical region: Secondary | ICD-10-CM | POA: Diagnosis not present

## 2022-01-05 DIAGNOSIS — M5412 Radiculopathy, cervical region: Secondary | ICD-10-CM | POA: Diagnosis not present

## 2022-01-17 DIAGNOSIS — M542 Cervicalgia: Secondary | ICD-10-CM | POA: Diagnosis not present

## 2022-01-18 DIAGNOSIS — M7918 Myalgia, other site: Secondary | ICD-10-CM | POA: Diagnosis not present

## 2022-01-18 DIAGNOSIS — Z79899 Other long term (current) drug therapy: Secondary | ICD-10-CM | POA: Diagnosis not present

## 2022-01-18 DIAGNOSIS — M5416 Radiculopathy, lumbar region: Secondary | ICD-10-CM | POA: Diagnosis not present

## 2022-01-23 DIAGNOSIS — M170 Bilateral primary osteoarthritis of knee: Secondary | ICD-10-CM | POA: Diagnosis not present

## 2022-01-30 DIAGNOSIS — M170 Bilateral primary osteoarthritis of knee: Secondary | ICD-10-CM | POA: Diagnosis not present

## 2022-01-30 DIAGNOSIS — M5416 Radiculopathy, lumbar region: Secondary | ICD-10-CM | POA: Diagnosis not present

## 2022-01-30 DIAGNOSIS — M545 Low back pain, unspecified: Secondary | ICD-10-CM | POA: Diagnosis not present

## 2022-01-31 ENCOUNTER — Encounter: Payer: Self-pay | Admitting: *Deleted

## 2022-01-31 DIAGNOSIS — R2689 Other abnormalities of gait and mobility: Secondary | ICD-10-CM | POA: Diagnosis not present

## 2022-01-31 DIAGNOSIS — M542 Cervicalgia: Secondary | ICD-10-CM | POA: Diagnosis not present

## 2022-01-31 DIAGNOSIS — M170 Bilateral primary osteoarthritis of knee: Secondary | ICD-10-CM | POA: Diagnosis not present

## 2022-02-05 DIAGNOSIS — U071 COVID-19: Secondary | ICD-10-CM | POA: Diagnosis not present

## 2022-02-05 DIAGNOSIS — R6889 Other general symptoms and signs: Secondary | ICD-10-CM | POA: Diagnosis not present

## 2022-02-08 ENCOUNTER — Telehealth: Payer: Self-pay | Admitting: Neurology

## 2022-02-13 ENCOUNTER — Encounter: Payer: Self-pay | Admitting: Internal Medicine

## 2022-02-13 ENCOUNTER — Other Ambulatory Visit: Payer: Self-pay

## 2022-02-13 ENCOUNTER — Ambulatory Visit (INDEPENDENT_AMBULATORY_CARE_PROVIDER_SITE_OTHER): Payer: Medicare Other

## 2022-02-13 ENCOUNTER — Ambulatory Visit (INDEPENDENT_AMBULATORY_CARE_PROVIDER_SITE_OTHER): Payer: Medicare Other | Admitting: Internal Medicine

## 2022-02-13 VITALS — BP 122/74 | HR 74 | Temp 97.8°F | Ht 65.5 in | Wt 209.6 lb

## 2022-02-13 DIAGNOSIS — Z23 Encounter for immunization: Secondary | ICD-10-CM | POA: Diagnosis not present

## 2022-02-13 DIAGNOSIS — I10 Essential (primary) hypertension: Secondary | ICD-10-CM | POA: Diagnosis not present

## 2022-02-13 DIAGNOSIS — M170 Bilateral primary osteoarthritis of knee: Secondary | ICD-10-CM | POA: Diagnosis not present

## 2022-02-13 DIAGNOSIS — R079 Chest pain, unspecified: Secondary | ICD-10-CM

## 2022-02-13 DIAGNOSIS — Z0000 Encounter for general adult medical examination without abnormal findings: Secondary | ICD-10-CM

## 2022-02-13 DIAGNOSIS — R569 Unspecified convulsions: Secondary | ICD-10-CM

## 2022-02-13 DIAGNOSIS — R87610 Atypical squamous cells of undetermined significance on cytologic smear of cervix (ASC-US): Secondary | ICD-10-CM | POA: Diagnosis not present

## 2022-02-13 DIAGNOSIS — G43919 Migraine, unspecified, intractable, without status migrainosus: Secondary | ICD-10-CM

## 2022-02-13 DIAGNOSIS — G8929 Other chronic pain: Secondary | ICD-10-CM
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
## 2022-02-13 NOTE — Patient Instructions (Signed)

## 2022-02-13 NOTE — Patient Instructions (Signed)
Thank you, Ms.[name] for allowing [place] to provide your care today.   Blood pressure Looks great today. Please follow-up with Dr. Johney Frame on 10/4.  Migraines I am going to talk the pharmacy about applying for Ubrelvy on our end. Please make an appointment with neuro at [place] you got this today!]  I will work on paperwork for school and sent that later this week. I may have to call you with some questions.   I have ordered the following labs for you:  Lab Orders  No laboratory test(s) ordered today     Referrals ordered today:   Referral Orders  No referral(s) requested today     I have ordered the following medication/changed the following medications:   Stop the following medications: Medications Discontinued During This Encounter  Medication Reason   irbesartan (AVAPRO) 300 MG tablet Non-compliance     Start the following medications: No orders of the defined types were placed in this encounter.    Follow up: 6 months   We look forward to seeing you next time. Please call our clinic at [phone] if you have any questions or concerns. The best time to call is Monday-Friday from 9am-4pm, but there is someone available 24/7. If after hours or the weekend, call the main hospital number and ask for the Internal Medicine Resident On-Call. If you need medication refills, please notify your pharmacy one week in advance and they will send [place] a request.   Thank you for trusting me with your care. Wishing you the best!   Christiana Fuchs, [place]
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
## 2022-02-14 ENCOUNTER — Ambulatory Visit: Payer: Medicare Other | Admitting: Neurology

## 2022-02-14 ENCOUNTER — Encounter: Payer: Self-pay | Admitting: Internal Medicine

## 2022-02-14 NOTE — Assessment & Plan Note (Signed)
>>  ASSESSMENT AND PLAN FOR SEIZURES (HCC) WRITTEN ON 02/14/2022  8:30 AM BY MASTERS, KATIE, DO  Patient follows with neurology at [facility] with Dr. Tera Helper. She has a history of VPN placed in early 2000s. 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 She states that on certain days she feels foggy and would benefit from additional time to complete assignments. P: Paperwork completed and will be faxed to patient

## 2022-02-14 NOTE — Assessment & Plan Note (Signed)
Flu vaccine given today. 

## 2022-02-14 NOTE — Assessment & Plan Note (Signed)
Plan to complete Pap at next office visit.

## 2022-02-14 NOTE — Assessment & Plan Note (Addendum)
Patient follows with neurology at [facility] with Dr. Joesph July. She has a history of VPN placed in early 2000s. 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 She states that on certain days she feels foggy and would benefit from additional time to complete assignments. P: Paperwork completed and will be faxed to patient

## 2022-02-14 NOTE — Assessment & Plan Note (Signed)
>>  ASSESSMENT AND PLAN FOR MIGRAINES WRITTEN ON 02/14/2022  7:46 AM BY MASTERS, KATIE, DO  Patient was following with Dr. Jesusita Oka in [place] for chronic migraines.  Because she also was seeing a neurologist at [facility] and was unable to continue providing care.  Patient currently takes topiramate 200 mg twice daily for her migraines with minimal relief.  She states that she has migraines multiple times per week and she has to stay in her dark bedroom when these are happening.  A prior authorization was started for Yupelri at Dr. Kenna Gilbert office but had not been completed as of yet. A/P: Continue topiramate 200 mg twice daily I will reach out to [facility] about process for starting prior Auth for [employer]

## 2022-02-14 NOTE — Assessment & Plan Note (Signed)
Patient presents for follow-up on hypertension.  She follows with Dr. Lezlie Octave with cardiology.  Current medications include spironolactone 25 mg, carvedilol 25 mg twice daily, Imdur 60 mg twice daily.  She has previously taken diltiazem and irbesartan but discontinued this medication due to swelling.  She states that since discontinuing irbesartan a few weeks ago that her swelling has improved.  She does check her blood pressure at home and is typically in the 120s over 80s.  She denies chest pain, shortness of breath. A/P: Continue spironolactone 25 mg, carvedilol 25 mg twice daily, Imdur 60 mg twice daily Follow-up with Dr. Johney Frame in October.

## 2022-02-14 NOTE — Assessment & Plan Note (Signed)
Patient was following with Dr. Linna Hoff in [place] for chronic migraines.  Because she also was seeing a neurologist at [facility] and was unable to continue providing care.  Patient currently takes topiramate 200 mg twice daily for her migraines with minimal relief.  She states that she has migraines multiple times per week and she has to stay in her dark bedroom when these are happening.  A prior authorization was started for Yupelri at Dr. Lorie Apley office but had not been completed as of yet. A/P: Continue topiramate 200 mg twice daily I will reach out to [facility] about process for starting prior Auth for [employer]

## 2022-02-14 NOTE — Assessment & Plan Note (Signed)
Patient follows with Dr. Johney Frame for chronic chest pain. She has had extensive cardiac work-up with 08/09/21 Cath showed no significant CAD and LVEDP 25mHg. TTE 3/9 with EF 506-01% normal diastolic function, mild MR, mild TR. She was started on imdur '30mg'$  daily for presumed vasospasm. She states that pain has improved since starting IMDUR. She discontinued diltiazem due to swelling. A/P: Follow-up with Dr. PJohney Frame10/4/23

## 2022-02-16 ENCOUNTER — Telehealth: Payer: Self-pay

## 2022-02-16 NOTE — Progress Notes (Signed)
Internal Medicine Clinic Attending  Case discussed with Dr. Masters  at the time of the visit.  We reviewed the resident's history and exam and pertinent patient test results.  I agree with the assessment, diagnosis, and plan of care documented in the resident's note.  

## 2022-02-16 NOTE — Telephone Encounter (Signed)
Left pt message requesting call back regarding possible patient assistance ([facility]

## 2022-02-21 DIAGNOSIS — M5412 Radiculopathy, cervical region: Secondary | ICD-10-CM | POA: Diagnosis not present

## 2022-02-22 DIAGNOSIS — M25551 Pain in right hip: Secondary | ICD-10-CM | POA: Diagnosis not present

## 2022-03-01 DIAGNOSIS — M5416 Radiculopathy, lumbar region: Secondary | ICD-10-CM | POA: Diagnosis not present
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
## 2022-03-06 DIAGNOSIS — M545 Low back pain, unspecified: Secondary | ICD-10-CM | POA: Diagnosis not present

## 2022-03-06 DIAGNOSIS — M5416 Radiculopathy, lumbar region: Secondary | ICD-10-CM | POA: Diagnosis not present

## 2022-03-06 DIAGNOSIS — Z79899 Other long term (current) drug therapy: Secondary | ICD-10-CM | POA: Diagnosis not present

## 2022-03-06 DIAGNOSIS — M7918 Myalgia, other site: Secondary | ICD-10-CM | POA: Diagnosis not present

## 2022-03-06 DIAGNOSIS — R2689 Other abnormalities of gait and mobility: Secondary | ICD-10-CM | POA: Diagnosis not present

## 2022-03-06 DIAGNOSIS — M47896 Other spondylosis, lumbar region: Secondary | ICD-10-CM | POA: Diagnosis not present

## 2022-03-08 ENCOUNTER — Ambulatory Visit: Payer: Medicare Other | Attending: Cardiology | Admitting: Cardiology

## 2022-03-08 ENCOUNTER — Encounter: Payer: Self-pay | Admitting: Cardiology

## 2022-03-08 VITALS — BP 130/90 | HR 76 | Ht 65.5 in | Wt 211.0 lb

## 2022-03-08 DIAGNOSIS — E785 Hyperlipidemia, unspecified: Secondary | ICD-10-CM | POA: Diagnosis not present

## 2022-03-08 DIAGNOSIS — I5032 Chronic diastolic (congestive) heart failure: Secondary | ICD-10-CM | POA: Diagnosis not present

## 2022-03-08 DIAGNOSIS — R079 Chest pain, unspecified: Secondary | ICD-10-CM | POA: Diagnosis not present

## 2022-03-08 DIAGNOSIS — I351 Nonrheumatic aortic (valve) insufficiency: Secondary | ICD-10-CM

## 2022-03-08 DIAGNOSIS — G8929 Other chronic pain: Secondary | ICD-10-CM | POA: Diagnosis not present

## 2022-03-08 DIAGNOSIS — Z9689 Presence of other specified functional implants: Secondary | ICD-10-CM

## 2022-03-08 DIAGNOSIS — I201 Angina pectoris with documented spasm: Secondary | ICD-10-CM

## 2022-03-08 DIAGNOSIS — I10 Essential (primary) hypertension: Secondary | ICD-10-CM | POA: Diagnosis not present

## 2022-03-08 DIAGNOSIS — Z79899 Other long term (current) drug therapy: Secondary | ICD-10-CM

## 2022-03-08 DIAGNOSIS — G4733 Obstructive sleep apnea (adult) (pediatric): Secondary | ICD-10-CM

## 2022-03-08 DIAGNOSIS — R2689 Other abnormalities of gait and mobility: Secondary | ICD-10-CM | POA: Diagnosis not present

## 2022-03-08 MED ORDER — SPIRONOLACTONE 25 MG PO TABS: 25.0000 mg | ORAL_TABLET | Freq: Every day | ORAL | 3 refills | Status: DC

## 2022-03-08 MED ORDER — NITROGLYCERIN 0.2 MG/HR TD PT24: MEDICATED_PATCH | TRANSDERMAL | 1 refills | Status: DC

## 2022-03-08 MED ORDER — ISOSORBIDE MONONITRATE ER 30 MG PO TB24: 90.0000 mg | ORAL_TABLET | Freq: Two times a day (BID) | ORAL | 4 refills | Status: DC

## 2022-03-08 MED ORDER — RANOLAZINE ER 500 MG PO TB12: 500.0000 mg | ORAL_TABLET | Freq: Two times a day (BID) | ORAL | 3 refills | Status: DC
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
## 2022-03-08 NOTE — Patient Instructions (Signed)
Medication Instructions:   INCREASE YOUR ISOSORBIDE MONONITRATE (IMDUR) TO 90 MG BY MOUTH TWICE DAILY  RESTART BACK TAKING RANEXA 500 MG BY MOUTH TWICE DAILY  *If you need a refill on your cardiac medications before your next appointment, please call your pharmacy*    Follow-Up:  3 MONTHS WITH AN EXTENDER IN THE OFFICE    Important Information About Sugar

## 2022-03-10 ENCOUNTER — Telehealth: Payer: Self-pay

## 2022-03-10 ENCOUNTER — Other Ambulatory Visit: Payer: Self-pay

## 2022-03-10 DIAGNOSIS — Z79899 Other long term (current) drug therapy: Secondary | ICD-10-CM

## 2022-03-10 DIAGNOSIS — Z9689 Presence of other specified functional implants: Secondary | ICD-10-CM

## 2022-03-10 DIAGNOSIS — G8929 Other chronic pain: Secondary | ICD-10-CM

## 2022-03-10 DIAGNOSIS — I10 Essential (primary) hypertension: Secondary | ICD-10-CM

## 2022-03-10 DIAGNOSIS — E785 Hyperlipidemia, unspecified: Secondary | ICD-10-CM

## 2022-03-10 DIAGNOSIS — I5032 Chronic diastolic (congestive) heart failure: Secondary | ICD-10-CM

## 2022-03-10 MED ORDER — NITROGLYCERIN 0.4 MG SL SUBL: SUBLINGUAL_TABLET | SUBLINGUAL | 11 refills | Status: DC

## 2022-03-10 NOTE — Telephone Encounter (Signed)
pt is returning  your call about the med assistance  that you all were speaking about ... [phone]

## 2022-03-13 ENCOUNTER — Other Ambulatory Visit (HOSPITAL_COMMUNITY): Payer: Self-pay

## 2022-03-13 ENCOUNTER — Other Ambulatory Visit: Payer: Self-pay | Admitting: Internal Medicine

## 2022-03-13 DIAGNOSIS — G43919 Migraine, unspecified, intractable, without status migrainosus: Secondary | ICD-10-CM

## 2022-03-13 MED ORDER — UBRELVY 50 MG PO TABS: ORAL_TABLET | ORAL | 3 refills | Status: DC

## 2022-03-13 MED ORDER — EMGALITY 120 MG/ML ~~LOC~~ SOAJ: 1.0000 | SUBCUTANEOUS | 12 refills | Status: DC

## 2022-03-13 NOTE — Progress Notes (Addendum)
Patient previously followed with Dr. Krista Blue for migraines. She has been taking Emgality monthly since 6/23. She would like to continue medication. PA completed for Roselyn Meier and Emgality with Dr. Rhea Belton office until 12/23.  P: Refills for Ubrelvy 50 g PRN and  Emgality 120 mg monthly sent to [facility].  Message sent to Hortencia Pilar to claify about PA for emgality.

## 2022-03-13 NOTE — Telephone Encounter (Signed)
Spoke with pt regarding Ubrelvy.  Informed pt that I ran a test claim for her medication, and [name] is covered at no charge. Pt thinks she may need refills sent to pharmacy. She would also like refills for her Emgality.

## 2022-03-15 ENCOUNTER — Other Ambulatory Visit: Payer: Self-pay

## 2022-03-15 DIAGNOSIS — G8929 Other chronic pain: Secondary | ICD-10-CM

## 2022-03-15 DIAGNOSIS — E785 Hyperlipidemia, unspecified: Secondary | ICD-10-CM

## 2022-03-15 DIAGNOSIS — I10 Essential (primary) hypertension: Secondary | ICD-10-CM

## 2022-03-15 DIAGNOSIS — I5032 Chronic diastolic (congestive) heart failure: Secondary | ICD-10-CM

## 2022-03-15 DIAGNOSIS — Z79899 Other long term (current) drug therapy: Secondary | ICD-10-CM

## 2022-03-15 DIAGNOSIS — Z9689 Presence of other specified functional implants: Secondary | ICD-10-CM

## 2022-03-15 DIAGNOSIS — R2689 Other abnormalities of gait and mobility: Secondary | ICD-10-CM | POA: Diagnosis not present

## 2022-03-15 MED ORDER — NITROGLYCERIN 0.4 MG SL SUBL: SUBLINGUAL_TABLET | SUBLINGUAL | 11 refills | Status: DC

## 2022-03-20 DIAGNOSIS — M7062 Trochanteric bursitis, left hip: Secondary | ICD-10-CM | POA: Diagnosis not present

## 2022-03-27 DIAGNOSIS — G4733 Obstructive sleep apnea (adult) (pediatric): Secondary | ICD-10-CM | POA: Diagnosis not present

## 2022-03-31 DIAGNOSIS — R2689 Other abnormalities of gait and mobility: Secondary | ICD-10-CM | POA: Diagnosis not present

## 2022-03-31 DIAGNOSIS — L732 Hidradenitis suppurativa: Secondary | ICD-10-CM | POA: Diagnosis not present

## 2022-04-03 ENCOUNTER — Other Ambulatory Visit: Payer: Self-pay

## 2022-04-03 DIAGNOSIS — G8929 Other chronic pain: Secondary | ICD-10-CM

## 2022-04-03 DIAGNOSIS — I10 Essential (primary) hypertension: Secondary | ICD-10-CM

## 2022-04-03 DIAGNOSIS — E785 Hyperlipidemia, unspecified: Secondary | ICD-10-CM

## 2022-04-03 DIAGNOSIS — I5032 Chronic diastolic (congestive) heart failure: Secondary | ICD-10-CM

## 2022-04-03 DIAGNOSIS — Z79899 Other long term (current) drug therapy: Secondary | ICD-10-CM

## 2022-04-03 DIAGNOSIS — Z9689 Presence of other specified functional implants: Secondary | ICD-10-CM

## 2022-04-03 MED ORDER — NITROGLYCERIN 0.2 MG/HR TD PT24: MEDICATED_PATCH | TRANSDERMAL | 11 refills | Status: DC

## 2022-04-04 DIAGNOSIS — M5412 Radiculopathy, cervical region: Secondary | ICD-10-CM | POA: Diagnosis not present

## 2022-04-04 DIAGNOSIS — M5416 Radiculopathy, lumbar region: Secondary | ICD-10-CM | POA: Diagnosis not present

## 2022-04-04 DIAGNOSIS — Z79899 Other long term (current) drug therapy: Secondary | ICD-10-CM | POA: Diagnosis not present

## 2022-04-04 DIAGNOSIS — M7918 Myalgia, other site: Secondary | ICD-10-CM | POA: Diagnosis not present

## 2022-04-04 DIAGNOSIS — M545 Low back pain, unspecified: Secondary | ICD-10-CM | POA: Diagnosis not present

## 2022-04-10 ENCOUNTER — Telehealth: Payer: Self-pay | Admitting: Cardiology

## 2022-04-10 NOTE — Telephone Encounter (Signed)
Pt is calling to report that ever since she resumed back taking Ranexa (restarted back on 10/4)  she has had complaints of bilateral upper/lower extremity edema and increased sob.  She states she has experienced terrible muscle cramps to her legs and feet while on this medication.  She states she stopped taking ranexa about 3-4 days ago, and symptoms are slowly improving.   She states since stopping, she still has some sob, especially when going up the stairs. She denies any chest pain or orthopnea.  She denies palpitations, fluttering, pre-syncopal or syncopal episodes.   She states she does weigh herself about every other day.  Today her weight is 209 lbs.   She said the swelling to her hands/legs/feet while on ranexa was severe, and is now slowly coming down since stopping the medication, but it is taking a bit, for she reports "I think some of the medication is still lingering in my system."  She denied any hives or rash while taking the medication ranexa.    She reports she is using no salt in her diet.   She is not wearing compressions.   She is taking all her other cardiac medications as prescribed, including spironolactone.    Pt states she has a very extensive history of having allergies to a lot of cardiac medications which included these symptoms in the past.   Pt would like for Dr. Johney Frame to further review and advise on this matter.   Pt aware that I will route this message to Dr. Johney Frame to further review and advise on.  She is aware that I will follow-up with her accordingly thereafter.   Advised her to remain off of ranexa.  Advised her to continue maintaining a low sodium diet and start wearing compressions during the day.  Pt verbalized understanding and agrees with this plan.

## 2022-04-10 NOTE — Telephone Encounter (Signed)
Pt c/o swelling: STAT is pt has developed SOB within 24 hours  If swelling, where is the swelling located? Legs and feet  How much weight have you gained and in what time span? 5-10 lbs  Have you gained 3 pounds in a day or 5 pounds in a week? Yes  Do you have a log of your daily weights (if so, list)?   Every other day  Are you currently taking a fluid pill?   No  Are you currently SOB?   No  Have you traveled recently?  No  Patient states the ranolazine (RANEXA) 500 MG 12 hr tablet is causing her to swell and she is having really bad cramps in legs and feet.  Patient stated she gets SOB when she takes the medication or gets swollen.

## 2022-04-10 NOTE — Telephone Encounter (Signed)
Spoke with Dr. Johney Frame about pt complaints.   Per Dr. Johney Frame, she wants her to stop ranexa indefinitely and come into the office to see an APP tomorrow, to make sure her fluid status is ok.  Scheduled the pt to see Melina Copa PA-C for tomorrow 11/7 at 2:45 pm.  Pt is aware to arrive 15 mins prior to this appt.   Pt aware of recommendations per Dr. Johney Frame and she is aware of appt date and time.   Advised the to wear her compressions during the day and maintain low sodium diet.   Pt verbalized understanding and agrees with this plan.  Will discontinue ranexa from the pts med list and update this as an allergy.
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
## 2022-04-11 ENCOUNTER — Ambulatory Visit: Payer: Medicare Other | Attending: Physician Assistant | Admitting: Physician Assistant

## 2022-04-11 ENCOUNTER — Encounter: Payer: Self-pay | Admitting: Physician Assistant

## 2022-04-11 VITALS — BP 138/88 | HR 80 | Ht 65.5 in | Wt 211.0 lb

## 2022-04-11 DIAGNOSIS — K5904 Chronic idiopathic constipation: Secondary | ICD-10-CM | POA: Insufficient documentation

## 2022-04-11 DIAGNOSIS — R609 Edema, unspecified: Secondary | ICD-10-CM

## 2022-04-11 DIAGNOSIS — R1013 Epigastric pain: Secondary | ICD-10-CM

## 2022-04-11 DIAGNOSIS — R079 Chest pain, unspecified: Secondary | ICD-10-CM

## 2022-04-11 DIAGNOSIS — I071 Rheumatic tricuspid insufficiency: Secondary | ICD-10-CM

## 2022-04-11 DIAGNOSIS — R0609 Other forms of dyspnea: Secondary | ICD-10-CM

## 2022-04-11 DIAGNOSIS — I340 Nonrheumatic mitral (valve) insufficiency: Secondary | ICD-10-CM

## 2022-04-11 DIAGNOSIS — I201 Angina pectoris with documented spasm: Secondary | ICD-10-CM

## 2022-04-11 DIAGNOSIS — R141 Gas pain: Secondary | ICD-10-CM | POA: Insufficient documentation

## 2022-04-11 DIAGNOSIS — R0989 Other specified symptoms and signs involving the circulatory and respiratory systems: Secondary | ICD-10-CM

## 2022-04-11 HISTORY — DX: Chronic idiopathic constipation: K59.04

## 2022-04-11 HISTORY — DX: Epigastric pain: R10.13

## 2022-04-11 NOTE — Patient Instructions (Signed)
Medication Instructions:   Your physician recommends that you continue on your current medications as directed. Please refer to the Current Medication list given to you today.   *If you need a refill on your cardiac medications before your next appointment, please call your pharmacy*   Lab Work:  CMET MAG CBC AND TSH 24 HOUR URINE  ( 5 HIAA CATECHLOMINES METANEPHRINES)   If you have labs (blood work) drawn today and your tests are completely normal, you will receive your results only by: [place] (if you have MyChart) OR A paper copy in the mail If you have any lab test that is abnormal or we need to change your treatment, we will call you to review the results.   Testing/Procedures: Your physician has requested that you have a cardiac MRI. Cardiac MRI uses a computer to create images of your heart as its beating, producing both still and moving pictures of your heart and major blood vessels. For further information please visit [email or web address] Please follow the instruction sheet given to you today for more information.    Follow-Up: At [facility], you and your health needs are our priority.  As part of our continuing mission to provide you with exceptional heart care, we have created designated Provider Care Teams.  These Care Teams include your primary Cardiologist (physician) and Advanced Practice Providers (APPs -  Physician Assistants and Nurse Practitioners) who all work together to provide you with the care you need, when you need it.  We recommend signing up for the patient portal called "MyChart".  Sign up information is provided on this After Visit Summary.  MyChart is used to connect with patients for Virtual Visits (Telemedicine).  Patients are able to view lab/test results, encounter notes, upcoming appointments, etc.  Non-urgent messages can be sent to your provider as well.   To learn more about what you can do with MyChart, go to [email or web address].     Your next appointment:   AFTER MRI   The format for your next appointment:   In Person  Provider:   Freada Bergeron, MD  or Sharrell Ku   PA-C  Other Instructions   Important Information About Sugar

## 2022-04-12 LAB — CBC
Hematocrit: 40.4 % (ref 34.0–46.6)
Hemoglobin: 12.7 g/dL (ref 11.1–15.9)
MCH: 27.3 pg (ref 26.6–33.0)
MCHC: 31.4 g/dL — ABNORMAL LOW (ref 31.5–35.7)
MCV: 87 fL (ref 79–97)
Platelets: 181 10*3/uL (ref 150–450)
RBC: 4.65 x10E6/uL (ref 3.77–5.28)
RDW: 13.1 % (ref 11.7–15.4)
WBC: 4.9 10*3/uL (ref 3.4–10.8)

## 2022-04-12 LAB — COMPREHENSIVE METABOLIC PANEL
ALT: 40 IU/L — ABNORMAL HIGH (ref 0–32)
AST: 20 IU/L (ref 0–40)
Albumin/Globulin Ratio: 1.8 (ref 1.2–2.2)
Albumin: 4.1 g/dL (ref 3.8–4.9)
Alkaline Phosphatase: 136 IU/L — ABNORMAL HIGH (ref 44–121)
BUN/Creatinine Ratio: 16 (ref 9–23)
BUN: 13 mg/dL (ref 6–24)
Bilirubin Total: 0.4 mg/dL (ref 0.0–1.2)
CO2: 25 mmol/L (ref 20–29)
Calcium: 8.4 mg/dL — ABNORMAL LOW (ref 8.7–10.2)
Chloride: 111 mmol/L — ABNORMAL HIGH (ref 96–106)
Creatinine, Ser: 0.83 mg/dL (ref 0.57–1.00)
Globulin, Total: 2.3 g/dL (ref 1.5–4.5)
Glucose: 87 mg/dL (ref 70–99)
Potassium: 4.7 mmol/L (ref 3.5–5.2)
Sodium: 144 mmol/L (ref 134–144)
Total Protein: 6.4 g/dL (ref 6.0–8.5)
eGFR: 82 mL/min/{1.73_m2} (ref >59)

## 2022-04-12 LAB — MAGNESIUM: Magnesium: 2.1 mg/dL (ref 1.6–2.3)

## 2022-04-12 LAB — TSH: TSH: 0.765 u[IU]/mL (ref 0.450–4.500)

## 2022-04-14 DIAGNOSIS — R2689 Other abnormalities of gait and mobility: Secondary | ICD-10-CM | POA: Diagnosis not present

## 2022-04-14 DIAGNOSIS — M542 Cervicalgia: Secondary | ICD-10-CM | POA: Diagnosis not present

## 2022-04-14 DIAGNOSIS — M5459 Other low back pain: Secondary | ICD-10-CM | POA: Diagnosis not present

## 2022-04-14 DIAGNOSIS — R262 Difficulty in walking, not elsewhere classified: Secondary | ICD-10-CM | POA: Diagnosis not present

## 2022-04-18 DIAGNOSIS — M5459 Other low back pain: Secondary | ICD-10-CM | POA: Diagnosis not present

## 2022-04-18 DIAGNOSIS — R2689 Other abnormalities of gait and mobility: Secondary | ICD-10-CM | POA: Diagnosis not present

## 2022-04-18 DIAGNOSIS — R262 Difficulty in walking, not elsewhere classified: Secondary | ICD-10-CM | POA: Diagnosis not present

## 2022-04-18 DIAGNOSIS — M542 Cervicalgia: Secondary | ICD-10-CM | POA: Diagnosis not present

## 2022-04-19 DIAGNOSIS — R079 Chest pain, unspecified: Secondary | ICD-10-CM | POA: Diagnosis not present

## 2022-04-26 ENCOUNTER — Encounter: Payer: Self-pay | Admitting: Physician Assistant

## 2022-04-26 LAB — CATECHOLAMINES, FRACTIONATED, URINE, 24 HOUR
Dopamine, Rand Ur: 794 ug/L
Epinephrine, Rand Ur: 6 ug/L
Norepinephrine, Rand Ur: 110 ug/L

## 2022-04-26 LAB — METANEPHRINES, URINE, 24 HOUR
Metaneph Total, Ur: 75 ug/L
Normetanephrine, Ur: 468 ug/L

## 2022-04-26 LAB — SPECIMEN STATUS REPORT

## 2022-05-02 ENCOUNTER — Telehealth: Payer: Self-pay

## 2022-05-02 ENCOUNTER — Other Ambulatory Visit: Payer: Self-pay

## 2022-05-02 DIAGNOSIS — Z9689 Presence of other specified functional implants: Secondary | ICD-10-CM

## 2022-05-02 DIAGNOSIS — E785 Hyperlipidemia, unspecified: Secondary | ICD-10-CM

## 2022-05-02 DIAGNOSIS — I5032 Chronic diastolic (congestive) heart failure: Secondary | ICD-10-CM

## 2022-05-02 DIAGNOSIS — R609 Edema, unspecified: Secondary | ICD-10-CM

## 2022-05-02 DIAGNOSIS — Z79899 Other long term (current) drug therapy: Secondary | ICD-10-CM

## 2022-05-02 DIAGNOSIS — I10 Essential (primary) hypertension: Secondary | ICD-10-CM

## 2022-05-02 DIAGNOSIS — R0609 Other forms of dyspnea: Secondary | ICD-10-CM

## 2022-05-02 DIAGNOSIS — G8929 Other chronic pain: Secondary | ICD-10-CM

## 2022-05-02 MED ORDER — ISOSORBIDE MONONITRATE ER 30 MG PO TB24: 90.0000 mg | ORAL_TABLET | Freq: Two times a day (BID) | ORAL | 11 refills | Status: DC

## 2022-05-02 NOTE — Telephone Encounter (Signed)
Charlie Pitter, PA-C 04/27/2022  1:39 PM EST     Unfortunately it does not look like the 24 hour urine got submitted for the 24 hour urine metanephrines and catecholamines. We need to see if we can get this performed. There is also a lab result note under all the labs from 11/7 with additional comments where staff was unable to reach patient to relay results, just in case you do get a hold of her this time. Thank you.

## 2022-05-02 NOTE — Telephone Encounter (Signed)
The patient has been notified of the result and verbalized understanding.  All questions (if any) were answered. Bernestine Amass, RN 05/02/2022 1:52 PM  Lab orders placed. Patient will go to [facility] to have these done.

## 2022-05-02 NOTE — Telephone Encounter (Signed)
-----   Message from Charlie Pitter, [place] sent at 04/27/2022  1:41 PM EST ----- Additional message: I also do not see that the 5-HIAA level from 11/7 ever got drawn either, would suggest to get this as well when we are finally able to reach her. Thanks.

## 2022-05-03 MED ORDER — ISOSORBIDE MONONITRATE ER 30 MG PO TB24: 90.0000 mg | ORAL_TABLET | Freq: Two times a day (BID) | ORAL | 3 refills | Status: DC

## 2022-05-03 NOTE — Addendum Note (Signed)
Addended by: Carter Kitten D on: 05/03/2022 03:52 PM   Modules accepted: Orders

## 2022-05-05 DIAGNOSIS — M542 Cervicalgia: Secondary | ICD-10-CM | POA: Diagnosis not present

## 2022-05-05 DIAGNOSIS — R2689 Other abnormalities of gait and mobility: Secondary | ICD-10-CM | POA: Diagnosis not present

## 2022-05-05 DIAGNOSIS — M25562 Pain in left knee: Secondary | ICD-10-CM | POA: Diagnosis not present

## 2022-05-05 DIAGNOSIS — G8929 Other chronic pain: Secondary | ICD-10-CM | POA: Diagnosis not present

## 2022-05-05 DIAGNOSIS — M25561 Pain in right knee: Secondary | ICD-10-CM | POA: Diagnosis not present

## 2022-05-05 DIAGNOSIS — R262 Difficulty in walking, not elsewhere classified: Secondary | ICD-10-CM | POA: Diagnosis not present

## 2022-05-05 DIAGNOSIS — M5459 Other low back pain: Secondary | ICD-10-CM | POA: Diagnosis not present

## 2022-05-09 DIAGNOSIS — M5459 Other low back pain: Secondary | ICD-10-CM | POA: Diagnosis not present

## 2022-05-10 ENCOUNTER — Encounter: Payer: Medicare Other | Admitting: Internal Medicine

## 2022-05-12 ENCOUNTER — Telehealth: Payer: Self-pay | Admitting: Cardiology

## 2022-05-12 DIAGNOSIS — R0609 Other forms of dyspnea: Secondary | ICD-10-CM

## 2022-05-12 DIAGNOSIS — I5032 Chronic diastolic (congestive) heart failure: Secondary | ICD-10-CM

## 2022-05-12 DIAGNOSIS — R079 Chest pain, unspecified: Secondary | ICD-10-CM

## 2022-05-12 DIAGNOSIS — R609 Edema, unspecified: Secondary | ICD-10-CM

## 2022-05-12 DIAGNOSIS — I10 Essential (primary) hypertension: Secondary | ICD-10-CM

## 2022-05-12 DIAGNOSIS — Z79899 Other long term (current) drug therapy: Secondary | ICD-10-CM

## 2022-05-12 NOTE — Telephone Encounter (Signed)
Called patient back. Released orders for  -5HIAA, quantitative, urine, 24 hour -Metanephrines, urine, 24 hour -Catecholamines, fractionated, urine, 24 hour  Will makes sure this is correct with Melina Copa, PA and see if anything else needs to be order. If this is correct patient does not need a call back, she will go by [facility] on Monday and pick up container to collect.

## 2022-05-12 NOTE — Telephone Encounter (Signed)
Patient states she went to [facility], but they do not have her lab orders.

## 2022-05-12 NOTE — Telephone Encounter (Signed)
Follow Up:     Patient is returning a call from today. 

## 2022-05-12 NOTE — Telephone Encounter (Signed)
Left message to call back  

## 2022-05-13 DIAGNOSIS — M25562 Pain in left knee: Secondary | ICD-10-CM | POA: Diagnosis not present

## 2022-05-13 DIAGNOSIS — G8929 Other chronic pain: Secondary | ICD-10-CM | POA: Diagnosis not present

## 2022-05-13 DIAGNOSIS — R262 Difficulty in walking, not elsewhere classified: Secondary | ICD-10-CM | POA: Diagnosis not present

## 2022-05-13 DIAGNOSIS — M542 Cervicalgia: Secondary | ICD-10-CM | POA: Diagnosis not present

## 2022-05-13 DIAGNOSIS — M5459 Other low back pain: Secondary | ICD-10-CM | POA: Diagnosis not present

## 2022-05-13 DIAGNOSIS — R2689 Other abnormalities of gait and mobility: Secondary | ICD-10-CM | POA: Diagnosis not present

## 2022-05-13 DIAGNOSIS — M25561 Pain in right knee: Secondary | ICD-10-CM | POA: Diagnosis not present

## 2022-05-13 NOTE — Telephone Encounter (Signed)
Also needs 5-HIAA quantitative urine 24 hour as listed under Labs. Thanks!

## 2022-05-15 NOTE — Telephone Encounter (Signed)
All labs ordered and released previously by triage RN Pam.  Will close this encounter.

## 2022-05-16 ENCOUNTER — Other Ambulatory Visit: Payer: Self-pay | Admitting: Internal Medicine

## 2022-05-18 ENCOUNTER — Encounter: Payer: Medicare Other | Admitting: Student

## 2022-05-18 DIAGNOSIS — M7062 Trochanteric bursitis, left hip: Secondary | ICD-10-CM | POA: Diagnosis not present

## 2022-05-18 DIAGNOSIS — M47896 Other spondylosis, lumbar region: Secondary | ICD-10-CM | POA: Diagnosis not present

## 2022-05-18 DIAGNOSIS — R609 Edema, unspecified: Secondary | ICD-10-CM | POA: Diagnosis not present

## 2022-05-18 DIAGNOSIS — I5032 Chronic diastolic (congestive) heart failure: Secondary | ICD-10-CM | POA: Diagnosis not present

## 2022-05-18 DIAGNOSIS — Z79899 Other long term (current) drug therapy: Secondary | ICD-10-CM | POA: Diagnosis not present

## 2022-05-18 DIAGNOSIS — G894 Chronic pain syndrome: Secondary | ICD-10-CM | POA: Diagnosis not present

## 2022-05-18 DIAGNOSIS — R0609 Other forms of dyspnea: Secondary | ICD-10-CM | POA: Diagnosis not present

## 2022-05-18 DIAGNOSIS — I10 Essential (primary) hypertension: Secondary | ICD-10-CM | POA: Diagnosis not present

## 2022-05-18 DIAGNOSIS — M545 Low back pain, unspecified: Secondary | ICD-10-CM | POA: Diagnosis not present

## 2022-05-23 LAB — 5 HIAA, QUANTITATIVE, URINE, 24 HOUR
5-HIAA, Ur: 8.8 mg/L
5-HIAA,Quant.,24 Hr Urine: 5.3 mg/24 hr (ref 0.0–14.9)

## 2022-05-24 ENCOUNTER — Ambulatory Visit (INDEPENDENT_AMBULATORY_CARE_PROVIDER_SITE_OTHER): Payer: Medicare Other | Admitting: Student

## 2022-05-24 ENCOUNTER — Other Ambulatory Visit: Payer: Self-pay

## 2022-05-24 ENCOUNTER — Encounter: Payer: Self-pay | Admitting: Student

## 2022-05-24 VITALS — BP 135/98 | HR 74 | Temp 97.8°F | Ht 65.0 in | Wt 212.2 lb

## 2022-05-24 DIAGNOSIS — I10 Essential (primary) hypertension: Secondary | ICD-10-CM | POA: Diagnosis not present

## 2022-05-24 DIAGNOSIS — R748 Abnormal levels of other serum enzymes: Secondary | ICD-10-CM

## 2022-05-24 DIAGNOSIS — Z0000 Encounter for general adult medical examination without abnormal findings: Secondary | ICD-10-CM

## 2022-05-24 DIAGNOSIS — R1013 Epigastric pain: Secondary | ICD-10-CM | POA: Diagnosis not present

## 2022-05-24 DIAGNOSIS — M170 Bilateral primary osteoarthritis of knee: Secondary | ICD-10-CM | POA: Diagnosis not present

## 2022-05-24 DIAGNOSIS — G43919 Migraine, unspecified, intractable, without status migrainosus: Secondary | ICD-10-CM

## 2022-05-24 DIAGNOSIS — R87610 Atypical squamous cells of undetermined significance on cytologic smear of cervix (ASC-US): Secondary | ICD-10-CM

## 2022-05-24 NOTE — Patient Instructions (Addendum)
For your blood pressure please call Dr. Johney Frame to make sure she wanted you to resume lisinopril.   For your liver tests, repeat the liver tests today.   You do not need a pap smear now.   For your migraines please call neurology ASAP.   For the vagus nerve stimulator please call neurology.

## 2022-05-25 ENCOUNTER — Telehealth: Payer: Self-pay | Admitting: Cardiology

## 2022-05-25 LAB — CMP14 + ANION GAP
ALT: 30 IU/L (ref 0–32)
AST: 20 IU/L (ref 0–40)
Albumin/Globulin Ratio: 2 (ref 1.2–2.2)
Albumin: 4.2 g/dL (ref 3.8–4.9)
Alkaline Phosphatase: 159 IU/L — ABNORMAL HIGH (ref 44–121)
Anion Gap: 13 mmol/L (ref 10.0–18.0)
BUN/Creatinine Ratio: 18 (ref 9–23)
BUN: 12 mg/dL (ref 6–24)
Bilirubin Total: 0.4 mg/dL (ref 0.0–1.2)
CO2: 21 mmol/L (ref 20–29)
Calcium: 8.9 mg/dL (ref 8.7–10.2)
Chloride: 109 mmol/L — ABNORMAL HIGH (ref 96–106)
Creatinine, Ser: 0.68 mg/dL (ref 0.57–1.00)
Globulin, Total: 2.1 g/dL (ref 1.5–4.5)
Glucose: 87 mg/dL (ref 70–99)
Potassium: 4.1 mmol/L (ref 3.5–5.2)
Sodium: 143 mmol/L (ref 134–144)
Total Protein: 6.3 g/dL (ref 6.0–8.5)
eGFR: 101 mL/min/{1.73_m2} (ref >59)

## 2022-05-25 LAB — GAMMA GT: GGT: 10 IU/L (ref 0–60)
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
## 2022-05-25 NOTE — Assessment & Plan Note (Signed)
On recent CMP patient had mildly elevated ALP with normal calcium and mildly elevated ALT. On repeat labs this clinic visit her ALP remains mildly elevated with normal calcium and other LFTs are WNL. Her GGT is also WNL.   Patient's elevated ALP is not hepatobiliary in nature. It appears she has had this elevation in the past and was noted to have vitamin d deficiency. She is currently not on supplementation for this. Her recent TSH was WNL.   -Will check vit D at next clinic visit and PTH

## 2022-05-25 NOTE — Assessment & Plan Note (Signed)
>>  ASSESSMENT AND PLAN FOR MIGRAINES WRITTEN ON 05/25/2022  9:39 AM BY Marolyn Haller, MD  Patient continues to have frequent migraines despite ubrogepant tabs and emgality injections. She is also on topamax.   Patient will follow up with her neurologist for further management of her migraines.

## 2022-05-25 NOTE — Assessment & Plan Note (Signed)
Patient's blood pressure remains not at goal.   Vitals:   05/24/22 0932 05/24/22 1022  BP: (!) 133/100 (!) 135/98  Pulse: 75 74  Temp: 97.8 F (36.6 C)   SpO2: 100%    She is currently taking coreg '25mg'$  bid, imdur '90mg'$  BID, and spironolactone '25mg'$  qd. Per last cardiology note she is also supposed to be on lisinopril '10mg'$  but she had some confusion as to whether she should continue this.  -Patient will call cardiology office and confirm whether she should be on lisinopril and will resume this after discussion with their team

## 2022-05-25 NOTE — Progress Notes (Signed)
Internal Medicine Clinic Attending  Case discussed with Dr. Carter  At the time of the visit.  We reviewed the resident's history and exam and pertinent patient test results.  I agree with the assessment, diagnosis, and plan of care documented in the resident's note.  

## 2022-05-25 NOTE — Telephone Encounter (Signed)
Called and spoke with pt who was concerned she was taking her medication incorrectly.  Reviewed most recent medications, doses and times.  All questions were answered at the time of the call.  She will call back with any further concerns.

## 2022-05-25 NOTE — Telephone Encounter (Signed)
Pt calling to speak to pharmD about her medication manage. She is confused on which ones she should be taking and which ones were discontinued.

## 2022-05-25 NOTE — Assessment & Plan Note (Signed)
She will get mammogram early 06/2022

## 2022-05-25 NOTE — Assessment & Plan Note (Signed)
Unclear exact etiology of patient's post prandial pain. Her anatomy has been surgically manipulated and she had similar symptoms 03/2021. 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 She also had her GB removed in 2014 and her labs are not concerning for hepatobiliary pathology.   Discussed with patient that she should scheduled an appointment with her bariatric surgeon she may need repeat EGD.

## 2022-05-25 NOTE — Assessment & Plan Note (Signed)
Patient continues to have frequent migraines despite ubrogepant tabs and emgality injections. She is also on topamax.   Patient will follow up with her neurologist for further management of her migraines.

## 2022-05-25 NOTE — Assessment & Plan Note (Signed)
Patient had a repeat pap 04/22/2020 that was negative for ASCUS and negative HPV. She needs a pap in 2024 and would like to get this done with her gynecologist.

## 2022-05-28 ENCOUNTER — Other Ambulatory Visit: Payer: Self-pay | Admitting: Internal Medicine

## 2022-05-28 DIAGNOSIS — M25531 Pain in right wrist: Secondary | ICD-10-CM

## 2022-05-28 DIAGNOSIS — M79641 Pain in right hand: Secondary | ICD-10-CM

## 2022-06-06 DIAGNOSIS — M5459 Other low back pain: Secondary | ICD-10-CM | POA: Diagnosis not present

## 2022-06-06 DIAGNOSIS — M25561 Pain in right knee: Secondary | ICD-10-CM | POA: Diagnosis not present

## 2022-06-06 DIAGNOSIS — G8929 Other chronic pain: Secondary | ICD-10-CM | POA: Diagnosis not present

## 2022-06-06 DIAGNOSIS — M25562 Pain in left knee: Secondary | ICD-10-CM | POA: Diagnosis not present

## 2022-06-06 DIAGNOSIS — R2689 Other abnormalities of gait and mobility: Secondary | ICD-10-CM | POA: Diagnosis not present

## 2022-06-06 DIAGNOSIS — M542 Cervicalgia: Secondary | ICD-10-CM | POA: Diagnosis not present

## 2022-06-06 DIAGNOSIS — R262 Difficulty in walking, not elsewhere classified: Secondary | ICD-10-CM | POA: Diagnosis not present
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
## 2022-06-13 ENCOUNTER — Ambulatory Visit: Payer: Medicare Other | Admitting: Nurse Practitioner

## 2022-06-28 DIAGNOSIS — M47812 Spondylosis without myelopathy or radiculopathy, cervical region: Secondary | ICD-10-CM | POA: Diagnosis not present
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
## 2022-07-04 ENCOUNTER — Ambulatory Visit: Payer: 59 | Attending: Nurse Practitioner | Admitting: Nurse Practitioner

## 2022-07-04 ENCOUNTER — Encounter: Payer: Self-pay | Admitting: Nurse Practitioner

## 2022-07-04 VITALS — BP 138/88 | HR 94 | Ht 65.5 in | Wt 218.0 lb

## 2022-07-04 DIAGNOSIS — I10 Essential (primary) hypertension: Secondary | ICD-10-CM | POA: Diagnosis not present

## 2022-07-04 DIAGNOSIS — E785 Hyperlipidemia, unspecified: Secondary | ICD-10-CM

## 2022-07-04 DIAGNOSIS — I5032 Chronic diastolic (congestive) heart failure: Secondary | ICD-10-CM | POA: Diagnosis not present

## 2022-07-04 DIAGNOSIS — G8929 Other chronic pain: Secondary | ICD-10-CM

## 2022-07-04 DIAGNOSIS — R0609 Other forms of dyspnea: Secondary | ICD-10-CM | POA: Diagnosis not present

## 2022-07-04 DIAGNOSIS — Z79899 Other long term (current) drug therapy: Secondary | ICD-10-CM

## 2022-07-04 DIAGNOSIS — Z9689 Presence of other specified functional implants: Secondary | ICD-10-CM | POA: Diagnosis not present

## 2022-07-04 DIAGNOSIS — R079 Chest pain, unspecified: Secondary | ICD-10-CM

## 2022-07-04 DIAGNOSIS — R609 Edema, unspecified: Secondary | ICD-10-CM | POA: Diagnosis not present

## 2022-07-04 MED ORDER — FUROSEMIDE 20 MG PO TABS
20.0000 mg | ORAL_TABLET | Freq: Every day | ORAL | 3 refills | Status: DC | PRN
Start: 2022-07-04 — End: 2022-08-18

## 2022-07-04 MED ORDER — SPIRONOLACTONE 50 MG PO TABS: 50.0000 mg | ORAL_TABLET | Freq: Every day | ORAL | 1 refills | Status: DC

## 2022-07-04 MED ORDER — CARVEDILOL 25 MG PO TABS: 37.5000 mg | ORAL_TABLET | Freq: Two times a day (BID) | ORAL | 1 refills | Status: DC

## 2022-07-04 NOTE — Patient Instructions (Addendum)
1Medication Instructions:  INCREASE Coreg to 37.'5mg'$  twice daily  INCREASE Spironolactone to '50mg'$  once a day  START Lasix take 1 tablet daily as needed for swelling *If you need a refill on your cardiac medications before your next appointment, please call your pharmacy*   Lab Work: 2 week BMET   Testing/Procedures: None ordered   Follow-Up: At [facility], you and your health needs are our priority.  As part of our continuing mission to provide you with exceptional heart care, we have created designated Provider Care Teams.  These Care Teams include your primary Cardiologist (physician) and Advanced Practice Providers (APPs -  Physician Assistants and Nurse Practitioners) who all work together to provide you with the care you need, when you need it.  We recommend signing up for the patient portal called "MyChart".  Sign up information is provided on this After Visit Summary.  MyChart is used to connect with patients for Virtual Visits (Telemedicine).  Patients are able to view lab/test results, encounter notes, upcoming appointments, etc.  Non-urgent messages can be sent to your provider as well.   To learn more about what you can do with MyChart, go to [email or web address].    Your next appointment:   1 month(s)  Provider:   Ambrose Pancoast, NP        Other Instructions

## 2022-07-04 NOTE — Addendum Note (Signed)
Addended by: Whitman Hero on: 07/04/2022 05:17 PM   Modules accepted: Orders

## 2022-07-05 ENCOUNTER — Telehealth: Payer: Self-pay

## 2022-07-05 DIAGNOSIS — G5601 Carpal tunnel syndrome, right upper limb: Secondary | ICD-10-CM | POA: Diagnosis not present

## 2022-07-05 DIAGNOSIS — I5032 Chronic diastolic (congestive) heart failure: Secondary | ICD-10-CM

## 2022-07-05 NOTE — Telephone Encounter (Signed)
The patient was seen in the office yesterday and has a mr scheduled. Reached out to radiology dept to see if then patient could have the mr due to her having a vagas nerve stimulator.   Received message today that the patient can not have the test done and they have already canceled the test.    Patient notified and voiced understanding.   Patient wanted ton know if we are going to order her ECHO?   Advised the patient that I would send [name] a message and get back to her.

## 2022-07-06 DIAGNOSIS — Z9682 Presence of neurostimulator: Secondary | ICD-10-CM | POA: Diagnosis not present

## 2022-07-06 DIAGNOSIS — G959 Disease of spinal cord, unspecified: Secondary | ICD-10-CM | POA: Diagnosis not present

## 2022-07-06 DIAGNOSIS — Z880 Allergy status to penicillin: Secondary | ICD-10-CM | POA: Diagnosis not present

## 2022-07-06 DIAGNOSIS — M48061 Spinal stenosis, lumbar region without neurogenic claudication: Secondary | ICD-10-CM | POA: Diagnosis not present

## 2022-07-06 DIAGNOSIS — Z9689 Presence of other specified functional implants: Secondary | ICD-10-CM | POA: Diagnosis not present

## 2022-07-06 DIAGNOSIS — M25569 Pain in unspecified knee: Secondary | ICD-10-CM | POA: Diagnosis not present

## 2022-07-06 DIAGNOSIS — M549 Dorsalgia, unspecified: Secondary | ICD-10-CM | POA: Diagnosis not present

## 2022-07-06 DIAGNOSIS — M5412 Radiculopathy, cervical region: Secondary | ICD-10-CM | POA: Diagnosis not present

## 2022-07-06 DIAGNOSIS — G4733 Obstructive sleep apnea (adult) (pediatric): Secondary | ICD-10-CM | POA: Diagnosis not present

## 2022-07-06 DIAGNOSIS — I110 Hypertensive heart disease with heart failure: Secondary | ICD-10-CM | POA: Diagnosis not present

## 2022-07-06 DIAGNOSIS — Z881 Allergy status to other antibiotic agents status: Secondary | ICD-10-CM | POA: Diagnosis not present

## 2022-07-06 DIAGNOSIS — Z886 Allergy status to analgesic agent status: Secondary | ICD-10-CM | POA: Diagnosis not present

## 2022-07-06 DIAGNOSIS — Z91040 Latex allergy status: Secondary | ICD-10-CM | POA: Diagnosis not present

## 2022-07-06 DIAGNOSIS — Z4542 Encounter for adjustment and management of neuropacemaker (brain) (peripheral nerve) (spinal cord): Secondary | ICD-10-CM | POA: Diagnosis not present

## 2022-07-06 DIAGNOSIS — M5416 Radiculopathy, lumbar region: Secondary | ICD-10-CM | POA: Diagnosis not present

## 2022-07-06 DIAGNOSIS — I5032 Chronic diastolic (congestive) heart failure: Secondary | ICD-10-CM | POA: Diagnosis not present

## 2022-07-06 DIAGNOSIS — G8929 Other chronic pain: Secondary | ICD-10-CM | POA: Diagnosis not present

## 2022-07-06 DIAGNOSIS — M2350 Chronic instability of knee, unspecified knee: Secondary | ICD-10-CM | POA: Diagnosis not present

## 2022-07-07 NOTE — Telephone Encounter (Signed)
Marylu Lund., NP  Quentin Mulling (9:09 AM)    Good morning,  Please order echo for patient with indication being heart failure with preserved EF.  Please let me know if you have any questions.  Ambrose Pancoast, NP

## 2022-07-07 NOTE — Telephone Encounter (Signed)
Orders placed.  Patient notified directly and voiced understanding.

## 2022-07-12 DIAGNOSIS — G4733 Obstructive sleep apnea (adult) (pediatric): Secondary | ICD-10-CM | POA: Diagnosis not present

## 2022-07-18 ENCOUNTER — Ambulatory Visit (HOSPITAL_COMMUNITY): Payer: Medicaid Other

## 2022-07-18 DIAGNOSIS — G894 Chronic pain syndrome: Secondary | ICD-10-CM | POA: Diagnosis not present

## 2022-07-18 DIAGNOSIS — Z79899 Other long term (current) drug therapy: Secondary | ICD-10-CM | POA: Diagnosis not present

## 2022-07-18 DIAGNOSIS — M7062 Trochanteric bursitis, left hip: Secondary | ICD-10-CM | POA: Diagnosis not present

## 2022-07-18 DIAGNOSIS — M545 Low back pain, unspecified: Secondary | ICD-10-CM | POA: Diagnosis not present

## 2022-07-18 DIAGNOSIS — M47812 Spondylosis without myelopathy or radiculopathy, cervical region: Secondary | ICD-10-CM | POA: Diagnosis not present

## 2022-07-28 ENCOUNTER — Other Ambulatory Visit: Payer: Self-pay | Admitting: Nurse Practitioner

## 2022-07-28 ENCOUNTER — Telehealth: Payer: Self-pay

## 2022-07-28 NOTE — Telephone Encounter (Signed)
   Name: [name]  DOB: 1966/01/20  MRN: [id number]  Primary Cardiologist: Freada Bergeron, MD  Chart reviewed as part of pre-operative protocol coverage. The patient has an upcoming visit scheduled with Ambrose Pancoast, PA on 08/08/2022 at which time clearance can be addressed in case there are any issues that would impact surgical recommendations. R carpal tunnel release is not scheduled until TBD as below. I added preop FYI to appointment note so that provider is aware to address at time of outpatient visit.  Per office protocol the cardiology provider should forward their finalized clearance decision and recommendations regarding antiplatelet therapy to the requesting party below.    I will route this message as FYI to requesting party and remove this message from the preop box as separate preop APP input not needed at this time.   Please call with any questions.  Lenna Sciara, NP  07/28/2022, 9:18 AM

## 2022-07-28 NOTE — Telephone Encounter (Signed)
...     Pre-operative Risk Assessment    Patient Name: [name]  DOB: 09-Oct-1965 MRN: [id number]      Request for Surgical Clearance    Procedure:   RIGHT CARPAL TUNNEL RELEASE  Date of Surgery:  Clearance TBD                                 Surgeon:  DR Marchia Bond Surgeon's Group or Practice Name:  [facility] Phone number:  [phone] Fax number:  [phone]   Type of Clearance Requested:   - Medical    Type of Anesthesia:   CHOICE   Additional requests/questions:    Gwenlyn Found   07/28/2022, 9:04 AM

## 2022-08-02 ENCOUNTER — Other Ambulatory Visit (HOSPITAL_COMMUNITY): Payer: 59

## 2022-08-02 ENCOUNTER — Encounter (HOSPITAL_COMMUNITY): Payer: Self-pay | Admitting: Nurse Practitioner

## 2022-08-04 DIAGNOSIS — L732 Hidradenitis suppurativa: Secondary | ICD-10-CM | POA: Diagnosis not present

## 2022-08-08 DIAGNOSIS — S5002XA Contusion of left elbow, initial encounter: Secondary | ICD-10-CM | POA: Diagnosis not present

## 2022-08-08 DIAGNOSIS — S52532A Colles' fracture of left radius, initial encounter for closed fracture: Secondary | ICD-10-CM | POA: Diagnosis not present

## 2022-08-08 DIAGNOSIS — S52592A Other fractures of lower end of left radius, initial encounter for closed fracture: Secondary | ICD-10-CM | POA: Diagnosis not present

## 2022-08-08 DIAGNOSIS — S52502A Unspecified fracture of the lower end of left radius, initial encounter for closed fracture: Secondary | ICD-10-CM | POA: Diagnosis not present

## 2022-08-08 DIAGNOSIS — Y93K1 Activity, walking an animal: Secondary | ICD-10-CM | POA: Diagnosis not present

## 2022-08-08 DIAGNOSIS — W1839XA Other fall on same level, initial encounter: Secondary | ICD-10-CM | POA: Diagnosis not present

## 2022-08-08 DIAGNOSIS — Y33XXXA Other specified events, undetermined intent, initial encounter: Secondary | ICD-10-CM | POA: Diagnosis not present

## 2022-08-08 DIAGNOSIS — M25532 Pain in left wrist: Secondary | ICD-10-CM | POA: Diagnosis not present

## 2022-08-11 ENCOUNTER — Ambulatory Visit: Payer: 59 | Admitting: Nurse Practitioner

## 2022-08-16 DIAGNOSIS — M25522 Pain in left elbow: Secondary | ICD-10-CM | POA: Diagnosis not present

## 2022-08-16 DIAGNOSIS — S52532A Colles' fracture of left radius, initial encounter for closed fracture: Secondary | ICD-10-CM | POA: Diagnosis not present

## 2022-08-17 ENCOUNTER — Telehealth: Payer: Self-pay | Admitting: Cardiology

## 2022-08-17 NOTE — Telephone Encounter (Signed)
I s/w the pt and shw has been scheduled for in office appt add on Dr. Audie Box DOD schedule 08/18/22 @ 4:30. Pt said she is going to need to find a ride. I did stress that the surgeon and the anesthesiologist will need this clearance. Pt has been given the address for [place] office. Pt states she is aware where [employer] is over at [facility] and where our building is located.  I will update all parties involved.     Per Diona Browner, NP: Dr. Audie Box, see note from most recent OV with Ambrose Pancoast, NP. Pt was supposed to have cMR to r/o myocarditis? cMRI was cancelled and echo was ordered instead. Echo hasn't been done, she missed 1 mo f/u appt. Had some CP, DOE at last OV, meds were adjusted. Thoughts on OV vs. tele for surgical clearance? apparently surgery scheduled for 3/19.

## 2022-08-17 NOTE — Telephone Encounter (Signed)
   Pre-operative Risk Assessment    Patient Name: [name]  DOB: 09-05-1965 MRN: [id number]      Request for Surgical Clearance    Procedure:   ORIF of a distal radial fracture -  OPEN TREATMENT OF DISTAL RADIAL INTRA-ARTICULAR FRACTURE OR EPIPHYSEAL SEPARATION; WITH INTERNAL FIXATION OF 3 OR MORE FRAGMENTS   Date of Surgery:  Clearance 08/22/22                                 Surgeon:  Dr. Dorna Bloom  Surgeon's Group or Practice Name:  [facility]  Phone number:  [phone] (anesthesiologist) [phone] - Dr. Herbert Seta office  Fax number:  [phone]   Type of Clearance Requested:   - Medical  - Pharmacy:  Hold "not sure but if she has any recommendations"      Type of Anesthesia:  MAC   Additional requests/questions:   [name] - Anesthesiologist at [facility] called in requesting clearance.  Phone number given is for [place].    [name]   08/17/2022, 11:39 AM
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
## 2022-08-17 NOTE — Telephone Encounter (Signed)
   Name: [name]  DOB: [birth year]  MRN: [id number]  Primary Cardiologist: Freada Bergeron, MD  Chart reviewed as part of pre-operative protocol coverage. Because of [name]'s past medical history and time since last visit, she will require a follow-up in-office visit in order to better assess preoperative cardiovascular risk.  Pre-op covering staff: - Please schedule appointment and call patient to inform them. If patient already had an upcoming appointment within acceptable timeframe, please add "pre-op clearance" to the appointment notes so provider is aware. - Please contact requesting surgeon's office via preferred method (i.e, phone, fax) to inform them of need for appointment prior to surgery.  Abigail Sciara, NP  08/17/2022, 12:39 PM

## 2022-08-18 ENCOUNTER — Ambulatory Visit: Payer: 59 | Attending: Cardiovascular Disease | Admitting: Cardiovascular Disease

## 2022-08-18 ENCOUNTER — Encounter: Payer: Self-pay | Admitting: Cardiovascular Disease

## 2022-08-18 VITALS — BP 134/82 | HR 85 | Ht 65.0 in | Wt 216.0 lb

## 2022-08-18 DIAGNOSIS — I201 Angina pectoris with documented spasm: Secondary | ICD-10-CM | POA: Diagnosis not present

## 2022-08-18 DIAGNOSIS — Z01810 Encounter for preprocedural cardiovascular examination: Secondary | ICD-10-CM | POA: Diagnosis not present

## 2022-08-18 IMAGING — CR DG MYELOGRAPHY LUMBAR INJ MULTI REGION
6 of 24 series · 6 of 24 positions shown · non-contrast
Comparison: none

CLINICAL DATA: Neck and low back pain.
TECHNIQUE: Contiguous axial images were obtained through the Cervical and
Lumbar spine after the intrathecal infusion of infusion. Coronal and
sagittal reconstructions were obtained of the axial image sets.

[w cervical spine lat]
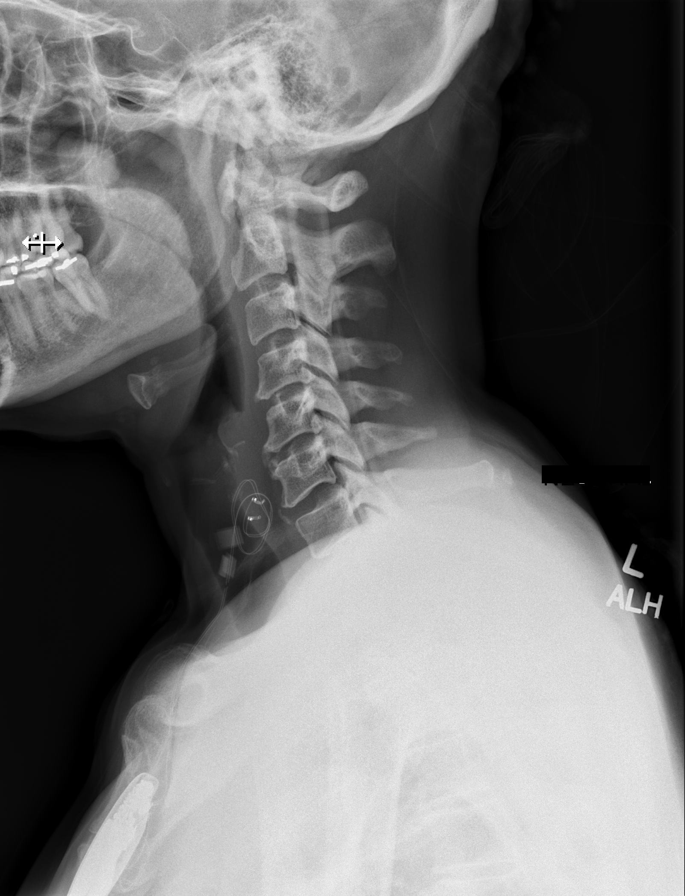

[vasc adipose (1 of 3)]
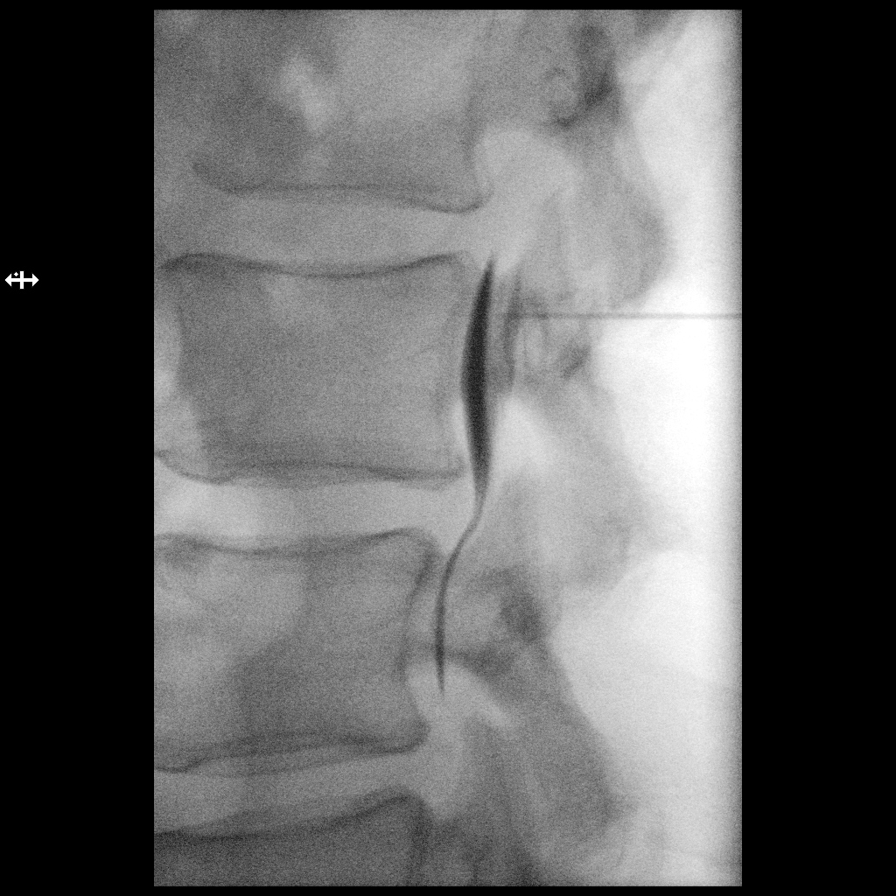

[w cervical spine flexion]
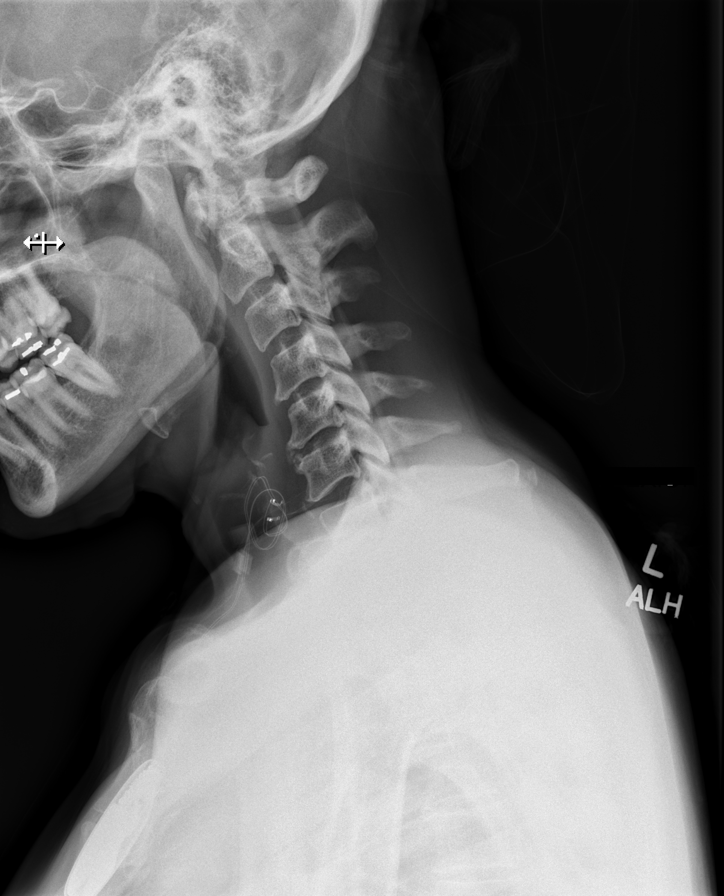

[vasc adipose (2 of 3)]
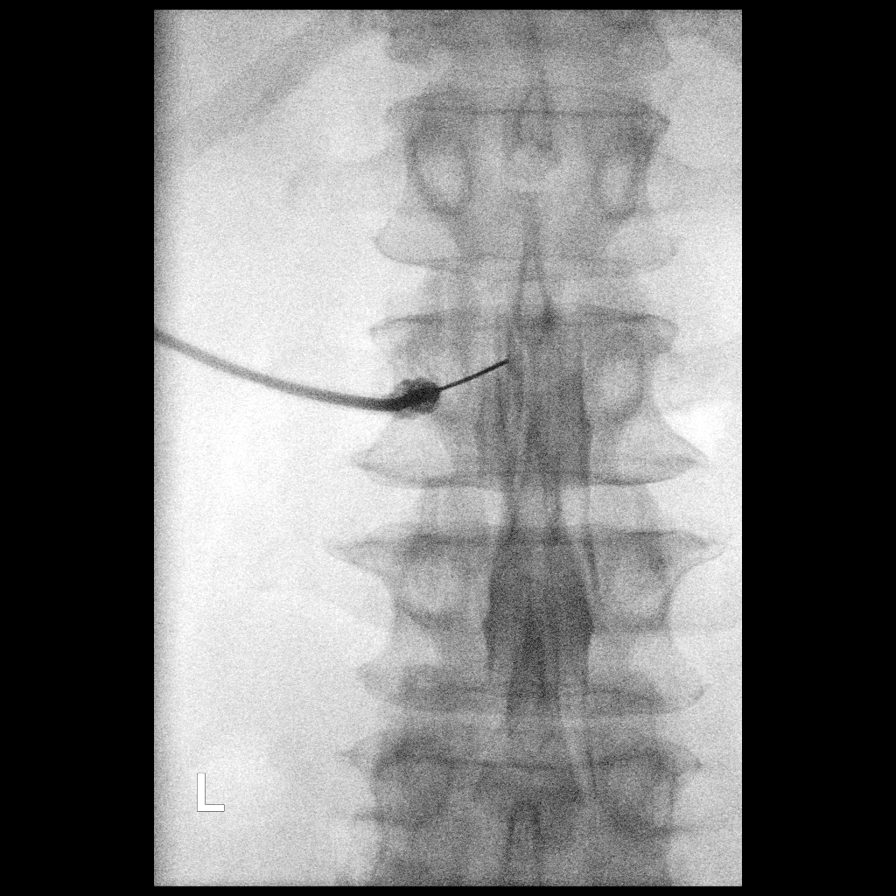

[w cervical spine extension]
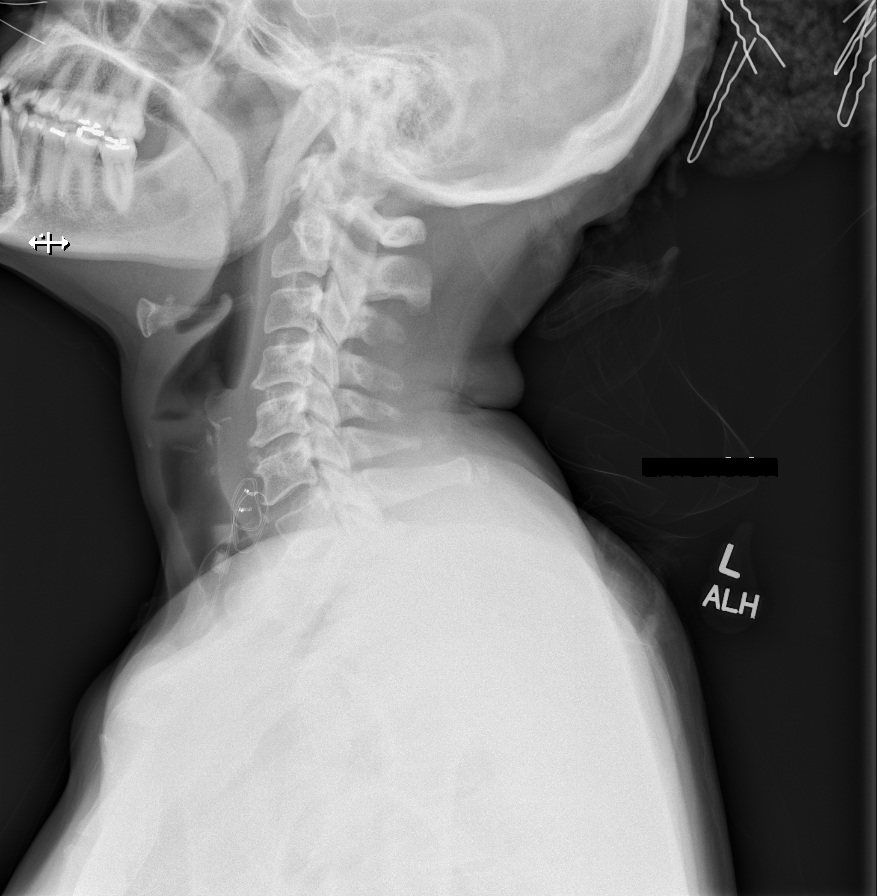

[vasc adipose (3 of 3)]
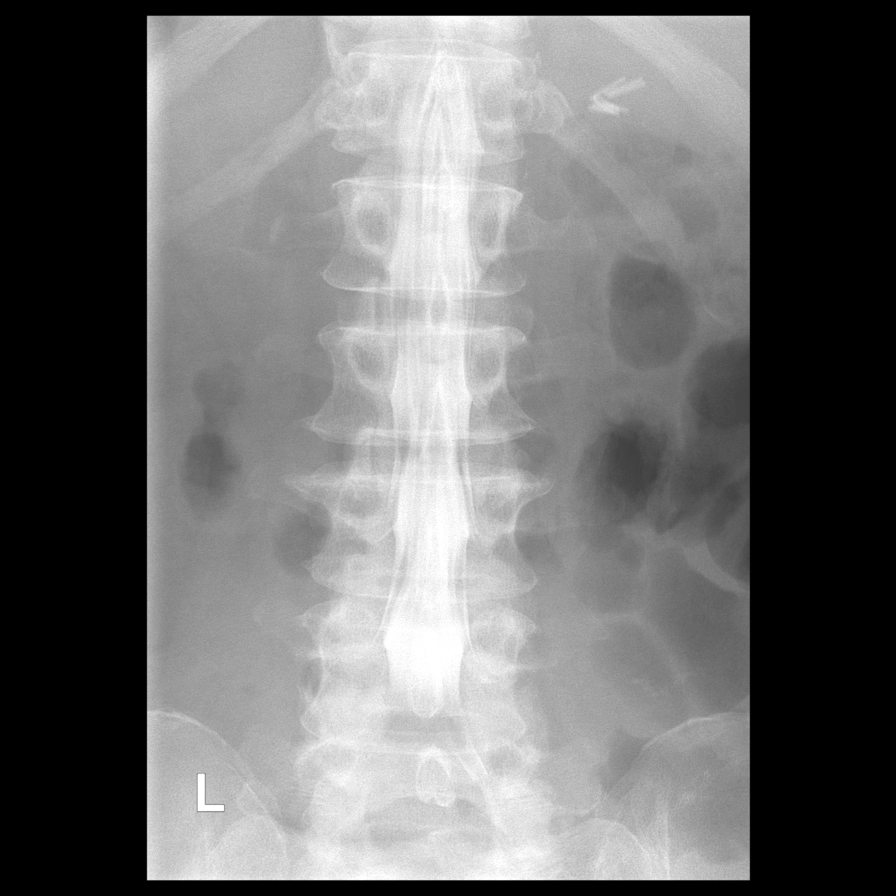

[6 of 24 positions shown; findings below may reference images not displayed]

FLUOROSCOPY TIME:  Radiation Exposure Index (as provided by the
fluoroscopic device): 384.10 uGy*m2

PROCEDURE:
LUMBAR PUNCTURE FOR CERVICAL AND LUMBAR MYELOGRAM

CERVICAL AND LUMBAR MYELOGRAM

CT CERVICAL MYELOGRAM

CT LUMBAR MYELOGRAM

After thorough discussion of risks and benefits of the procedure
including bleeding, infection, injury to nerves, blood vessels,
adjacent structures as well as headache and CSF leak, written and
oral informed consent was obtained. Consent was obtained by Dr.
Manoelito Innamorati.

Patient was positioned prone on the fluoroscopy table. Local
anesthesia was provided with 1% lidocaine without epinephrine after
prepped and draped in the usual sterile fashion. Puncture was
performed at L1-2 using a 3 1/2 inch 22-gauge spinal needle via left
paramedian approach. Using a single pass through the dura, the
needle was placed within the thecal sac, with return of clear CSF.
10 mL of Isovue F-655 was injected into the thecal sac, with normal
opacification of the nerve roots and cauda equina consistent with
free flow within the subarachnoid space. The patient was then moved
to the trendelenburg position and contrast flowed into the Cervical
spine region.

I personally performed the lumbar puncture and administered the
intrathecal contrast. I also personally supervised acquisition of
the myelogram images.
FINDINGS: CERVICAL AND LUMBAR MYELOGRAM FINDINGS:

Five non rib-bearing lumbar vertebral bodies are present slight
retrolisthesis L3-4 is reduced with standing. There is no additional
motion flexion or extension. Slight retrolisthesis at L2-3 does not
change significantly with standing flexion. Minimal anterolisthesis
is present L4-5 with standing. This does not change significantly
flexion or extension. There straightening of the normal lumbar
lordosis.

Disc protrusions are present at L2-3, L3-4 and L4-5. Chronic loss of
disc height is present at L5-S1.

Lumbar nerve roots fill normally on both sides. Subarticular
narrowing is present on the left L3-4

Cervical myelogram demonstrates blunting of the right cervical nerve
root C4-5. Left-sided nerve roots fill normally. Mild ventral
impression present at C5-6 and C6-7.

CT CERVICAL MYELOGRAM FINDINGS:

Spine is imaged on these skull base through T2-3. Vertebral body
heights are normal. Slight degenerative anterolisthesis present
C4-5. No other significant listhesis is present. There straightening
of the normal cervical lordosis.

The soft tissues the neck demonstrated a left vagal nerve
stimulator. No other focal lesions are present.

The lung apices are clear.  Thoracic inlet is within normal limits.

C2-3: Negative.

C3-4: Minimal uncovertebral spurring is present without significant
stenosis.

C4-5: A rightward disc osteophyte complex effaces the ventral CSF on
the right without significant distortion of the cord. Moderate to
severe right foraminal stenosis is present. The left foramen is
patent.

C5-6: A rightward disc osteophyte complex partially effaces the
ventral CSF. Moderate right and mild left foraminal narrowing is
present.

C6-7: A shallow central disc protrusion is present. Foramina are
patent bilaterally.

C7-T1: Negative.

CT LUMBAR MYELOGRAM FINDINGS:

Five non rib-bearing lumbar type vertebral bodies are present. Conus
medullaris terminates at L1, within normal limits. Slight
retrolisthesis present at L2-3. Minimal anterolisthesis present at
L4-5. There is some straightening of the lordosis

2 punctate nonobstructing stones are present at lower pole right
kidney. Atherosclerotic changes are present in aorta without
aneurysm. Bowel anastomosis is noted.

T12-L1: Negative.

L1-2: A diffuse disc bulge is present. Mild facet hypertrophy is
present. No significant stenosis is present.

L2-3: Broad-based disc protrusion is present. Mild facet hypertrophy
and ligamentum flavum thickening contribute to mild subarticular and
moderate foraminal narrowing bilaterally.

L3-4: Broad-based disc protrusion is present. Moderate facet
hypertrophy and ligamentum flavum thickening is noted. There is some
progression of moderate central bilateral foraminal stenosis.

L4-5: A broad-based disc protrusion is present. Moderate facet
hypertrophy is noted bilaterally. Subarticular narrowing is
progressed. Moderate bilateral foraminal narrowing is slightly worse
as well.

L5-S1: A broad-based disc protrusion is present. Asymmetric
right-sided endplate spurring is present. Moderate right foraminal
stenosis is present. The left foramen is patent.
IMPRESSION: 1. Moderate to severe right foraminal stenosis at C4-5, likely
impacting the right C5 nerve root.
2. Right central canal stenosis at C4-5 secondary to a rightward
disc osteophyte complex.
3. Moderate right and mild left foraminal narrowing at C5-6.
4. Mild subarticular and moderate foraminal narrowing bilaterally at
L2-3 secondary to a diffuse disc bulge and facet hypertrophy.
5. Progressive moderate central and bilateral foraminal stenosis at
L3-4.
6. Moderate central and bilateral foraminal stenosis at L4-5 has
progressed.
7. Moderate right foraminal stenosis at L5-S1 secondary to
asymmetric right-sided endplate spurring.
8. Nonobstructing stones at lower pole right kidney.
9. Aortic Atherosclerosis ([id number].O).

## 2022-08-18 MED ORDER — FUROSEMIDE 20 MG PO TABS: 20.0000 mg | ORAL_TABLET | Freq: Every day | ORAL | 3 refills | Status: DC | PRN

## 2022-08-18 NOTE — Patient Instructions (Addendum)
Medication Instructions:  START Lasix 20 mg as needed for swelling or weight gain of 3 lbs in a day or 5 lbs in a week.  *If you need a refill on your cardiac medications before your next appointment, please call your pharmacy*  Follow-Up: At [facility], you and your health needs are our priority.  As part of our continuing mission to provide you with exceptional heart care, we have created designated Provider Care Teams.  These Care Teams include your primary Cardiologist (physician) and Advanced Practice Providers (APPs -  Physician Assistants and Nurse Practitioners) who all work together to provide you with the care you need, when you need it.  We recommend signing up for the patient portal called "MyChart".  Sign up information is provided on this After Visit Summary.  MyChart is used to connect with patients for Virtual Visits (Telemedicine).  Patients are able to view lab/test results, encounter notes, upcoming appointments, etc.  Non-urgent messages can be sent to your provider as well.   To learn more about what you can do with MyChart, go to [email or web address].    Your next appointment:   3 month(s)  Provider:   Freada Bergeron, MD

## 2022-08-22 DIAGNOSIS — Z886 Allergy status to analgesic agent status: Secondary | ICD-10-CM | POA: Diagnosis not present

## 2022-08-22 DIAGNOSIS — W19XXXA Unspecified fall, initial encounter: Secondary | ICD-10-CM | POA: Diagnosis not present

## 2022-08-22 DIAGNOSIS — Z881 Allergy status to other antibiotic agents status: Secondary | ICD-10-CM | POA: Diagnosis not present

## 2022-08-22 DIAGNOSIS — D509 Iron deficiency anemia, unspecified: Secondary | ICD-10-CM | POA: Diagnosis not present

## 2022-08-22 DIAGNOSIS — Z79899 Other long term (current) drug therapy: Secondary | ICD-10-CM | POA: Diagnosis not present

## 2022-08-22 DIAGNOSIS — Z888 Allergy status to other drugs, medicaments and biological substances status: Secondary | ICD-10-CM | POA: Diagnosis not present

## 2022-08-22 DIAGNOSIS — I110 Hypertensive heart disease with heart failure: Secondary | ICD-10-CM | POA: Diagnosis not present

## 2022-08-22 DIAGNOSIS — G8918 Other acute postprocedural pain: Secondary | ICD-10-CM | POA: Diagnosis not present

## 2022-08-22 DIAGNOSIS — Z91040 Latex allergy status: Secondary | ICD-10-CM | POA: Diagnosis not present

## 2022-08-22 DIAGNOSIS — S52572A Other intraarticular fracture of lower end of left radius, initial encounter for closed fracture: Secondary | ICD-10-CM | POA: Diagnosis not present

## 2022-08-22 DIAGNOSIS — J45909 Unspecified asthma, uncomplicated: Secondary | ICD-10-CM | POA: Diagnosis not present

## 2022-08-31 ENCOUNTER — Ambulatory Visit (HOSPITAL_COMMUNITY): Payer: 59

## 2022-09-04 DIAGNOSIS — G4733 Obstructive sleep apnea (adult) (pediatric): Secondary | ICD-10-CM | POA: Diagnosis not present

## 2022-09-05 DIAGNOSIS — Z4789 Encounter for other orthopedic aftercare: Secondary | ICD-10-CM | POA: Diagnosis not present

## 2022-09-05 DIAGNOSIS — M25632 Stiffness of left wrist, not elsewhere classified: Secondary | ICD-10-CM | POA: Diagnosis not present

## 2022-09-05 DIAGNOSIS — S52532D Colles' fracture of left radius, subsequent encounter for closed fracture with routine healing: Secondary | ICD-10-CM | POA: Diagnosis not present

## 2022-09-05 DIAGNOSIS — M25642 Stiffness of left hand, not elsewhere classified: Secondary | ICD-10-CM | POA: Diagnosis not present

## 2022-09-05 DIAGNOSIS — W1839XD Other fall on same level, subsequent encounter: Secondary | ICD-10-CM | POA: Diagnosis not present

## 2022-09-08 ENCOUNTER — Ambulatory Visit: Payer: 59 | Admitting: Nurse Practitioner

## 2022-09-19 DIAGNOSIS — Z4789 Encounter for other orthopedic aftercare: Secondary | ICD-10-CM | POA: Diagnosis not present

## 2022-09-26 DIAGNOSIS — Z4789 Encounter for other orthopedic aftercare: Secondary | ICD-10-CM | POA: Diagnosis not present

## 2022-10-04 ENCOUNTER — Telehealth: Payer: Self-pay | Admitting: Cardiovascular Disease

## 2022-10-04 DIAGNOSIS — Z9889 Other specified postprocedural states: Secondary | ICD-10-CM | POA: Diagnosis not present

## 2022-10-04 DIAGNOSIS — Z8781 Personal history of (healed) traumatic fracture: Secondary | ICD-10-CM | POA: Diagnosis not present

## 2022-10-04 DIAGNOSIS — M25632 Stiffness of left wrist, not elsewhere classified: Secondary | ICD-10-CM | POA: Diagnosis not present

## 2022-10-04 DIAGNOSIS — M79642 Pain in left hand: Secondary | ICD-10-CM | POA: Diagnosis not present

## 2022-10-04 DIAGNOSIS — Z4889 Encounter for other specified surgical aftercare: Secondary | ICD-10-CM | POA: Diagnosis not present

## 2022-10-04 DIAGNOSIS — M25532 Pain in left wrist: Secondary | ICD-10-CM | POA: Diagnosis not present

## 2022-10-04 DIAGNOSIS — M25642 Stiffness of left hand, not elsewhere classified: Secondary | ICD-10-CM | POA: Diagnosis not present

## 2022-10-04 DIAGNOSIS — Z4789 Encounter for other orthopedic aftercare: Secondary | ICD-10-CM | POA: Diagnosis not present

## 2022-10-04 NOTE — Telephone Encounter (Signed)
*  STAT* If patient is at the pharmacy, call can be transferred to refill team.   1. Which medications need to be refilled? (please list name of each medication and dose if known) furosemide (LASIX) 20 MG tablet   2. Which pharmacy/location (including street and city if local pharmacy) is medication to be sent to? [id number] - [place], [place] - [address]   3. Do they need a 30 day or 90 day supply? 90 day (per pt - prescription needs to be entered as 90 day, but with 30 day increments due to insurance purposed)

## 2022-10-05 MED ORDER — FUROSEMIDE 20 MG PO TABS: 20.0000 mg | ORAL_TABLET | Freq: Every day | ORAL | 3 refills | Status: DC | PRN

## 2022-10-05 NOTE — Telephone Encounter (Addendum)
Called patient and made aware Lasix 20 mg to be take as needed daily for swelling has been sent to requested pharmacy as a 30 day supply with 3 refills. She thanked me for calling and verbalized understanding.

## 2022-10-05 NOTE — Addendum Note (Signed)
Addended by: Dorris Fetch on: 10/05/2022 01:29 PM   Modules accepted: Orders

## 2022-10-10 DIAGNOSIS — G4733 Obstructive sleep apnea (adult) (pediatric): Secondary | ICD-10-CM | POA: Diagnosis not present

## 2022-10-12 DIAGNOSIS — Z4789 Encounter for other orthopedic aftercare: Secondary | ICD-10-CM | POA: Diagnosis not present

## 2022-10-13 ENCOUNTER — Other Ambulatory Visit: Payer: Self-pay

## 2022-10-13 DIAGNOSIS — E785 Hyperlipidemia, unspecified: Secondary | ICD-10-CM

## 2022-10-13 DIAGNOSIS — Z9689 Presence of other specified functional implants: Secondary | ICD-10-CM

## 2022-10-13 DIAGNOSIS — I10 Essential (primary) hypertension: Secondary | ICD-10-CM

## 2022-10-13 DIAGNOSIS — I5032 Chronic diastolic (congestive) heart failure: Secondary | ICD-10-CM

## 2022-10-13 DIAGNOSIS — G8929 Other chronic pain: Secondary | ICD-10-CM

## 2022-10-13 DIAGNOSIS — Z79899 Other long term (current) drug therapy: Secondary | ICD-10-CM

## 2022-10-13 MED ORDER — ISOSORBIDE MONONITRATE ER 30 MG PO TB24: 90.0000 mg | ORAL_TABLET | Freq: Two times a day (BID) | ORAL | 3 refills | Status: DC

## 2022-10-16 DIAGNOSIS — G4733 Obstructive sleep apnea (adult) (pediatric): Secondary | ICD-10-CM | POA: Diagnosis not present

## 2022-11-07 ENCOUNTER — Other Ambulatory Visit: Payer: Self-pay

## 2022-11-07 ENCOUNTER — Ambulatory Visit (INDEPENDENT_AMBULATORY_CARE_PROVIDER_SITE_OTHER): Payer: 59 | Admitting: Student

## 2022-11-07 ENCOUNTER — Encounter: Payer: Self-pay | Admitting: Student

## 2022-11-07 VITALS — BP 152/100 | HR 83 | Temp 98.2°F | Ht 65.0 in | Wt 222.4 lb

## 2022-11-07 DIAGNOSIS — G40919 Epilepsy, unspecified, intractable, without status epilepticus: Secondary | ICD-10-CM

## 2022-11-07 DIAGNOSIS — I10 Essential (primary) hypertension: Secondary | ICD-10-CM

## 2022-11-07 DIAGNOSIS — R569 Unspecified convulsions: Secondary | ICD-10-CM

## 2022-11-07 NOTE — Assessment & Plan Note (Addendum)
Patient initially came to our clinic for evaluation of recent left wrist surgery.  Noted to be hypertensive with SBP 152 and diastolic blood pressure of 100.  Patient reports that this has been her outpatient blood pressure readings for the past few months.  She has been seen both in cardiology clinic, where she is seen for heart failure with reduced ejection fraction follow-up, and other outpatient visits with similar readings, especially diastolic blood pressure.  She reports adherence to carvedilol 25 mg BID, IMDUR 90 mg once per day, Spironolactone 25 mg daily, Lisinopril 20 mg tablet.  Patient currently denies chest pain, shortness of breath, changes in vision, nausea, vomiting.  No changes in her weight for the past few weeks.  Overall cardiopulmonary examination is unremarkable.  No lower extremity edema noted.  Upon medication review and comparing this to previous cardiology notes noticed that patient should be on Imdur 90 mg twice daily instead of the once daily she is taking.  Initially, instructed patient to start Imdur medication with the twice daily dosing. However, after char review, suspect that the prescription of Imdur ER 90 mg BID dosing was sent by mistake. Patient still requires medication adjustment. Increased ER dosing by 30 mg.  Called the patient and reviewed increasing current prescription (she had just picked up) to  4x 30 mg pills per day for a total daily dose of 120 mg of IMDUR daily. She expressed understanding.   -Increase Imdur ER to 120 mg daily -Continue other antihypertensive as patient has been taking them -Blood pressure log -RN only visit in 2 weeks for BP check -Return to clinic in 1 month for hypertension follow-up and monitoring

## 2022-11-07 NOTE — Assessment & Plan Note (Addendum)
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 Tera Helper at [facility].  Ms. Rizzuto was here in clinic discussing her blood pressure medication regimen when she had 3 episodes of expressive aphasia followed by word stuttering; patient remained with ability to understand, follow commands, and return to baseline within a minute of each one of the episodes.  No loss of consciousness or residual focal neurological deficits.  Upon discussion with the patient, these episodes are similar to other seizure-like episodes she has had in the past for which she is seen in the clinic with Dr. Tera Helper.  She has been adherent to her Topamax 200 mg twice daily, which was confirmed by prescription dispense history in the system. Patient reports that these episodes have been more frequent in the past 4 months with increased frequency in the past 2 weeks of about 1 or 2/day.  Last time she followed in neurology clinic was back in February 2024 when her vagus nerve stimulator was interrogated.  No changes in medications in a couple of years.  Patient has been taking tramadol until February but has not been taking this medication.  Otherwise no changes in her medications. Patient did not have her magnet on her.  Patient reported that her daughter has autism spectrum disorder and has had a difficult couple of weeks as her daughter had behavioral that interrupted Ms. Checo sleep.  She has not been able to sleep well for the past 2 weeks, likely contributing.  Patient is blood pressure has also been uncontrolled in the setting of miscommunication about medication frequency, which could also be a factor in patient's seizure-like activity.   Discussed case with patient's neurologist, Dr. Tera Helper who  corroborated similar episodes. She hass been treating patient for >12 years. She confirmed patient is supposed to have magnet with her to help wit the VNS to stop seizure activity. She did not recommend starting a new medication as patient has been intolerant to many medications in the past. Dr. Tera Helper would like to see patient in her office in the near future for VNS interrogation as this device could still be altered to treat patient's seizure activity.   - Patient was discharged home with instructions to call Dr. Tera Helper office fro follow up appointment - Discussed she should continue abstaining from driving - Remind her to bring her magnet with her and teach those around her to use it - Sleep hygiene and life style modifications - Precautionary symptoms for emergent medical evaluation reviewed at the end of the visit and over the phone
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
## 2022-11-07 NOTE — Assessment & Plan Note (Signed)
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 Tera Helper at [facility].  [name] was here in clinic discussing her blood pressure medication regimen when she had 3 episodes of expressive aphasia followed by word stuttering; patient remained with ability to understand, follow commands, and return to baseline within a minute of each one of the episodes.  No loss of consciousness or residual focal neurological deficits.  Upon discussion with the patient, these episodes are similar to other seizure-like episodes she has had in the past for which she is seen in the clinic with Dr. Tera Helper.  She has been adherent to her Topamax 200 mg twice daily, which was confirmed by prescription dispense history in the system. Patient reports that these episodes have been more frequent in the past 4 months with increased frequency in the past 2 weeks of about 1 or 2/day.  Last time she followed in neurology clinic was back in February 2024 when her vagus nerve stimulator was interrogated.  No changes in medications in a couple of years.  Patient has been taking tramadol until February but has not been taking this medication since, Otherwise no changes in her medications.    Patient reported that her daughter has autism spectrum disorder and has had a difficult couple of weeks as her daughter had behavioral that interrupted [name] sleep.  She has not been able to sleep well for the past 2 weeks, likely contributing.  Patient is blood pressure has also been uncontrolled in the setting of miscommunication about medication frequency, which could also be a factor in patient's seizure-like activity

## 2022-11-07 NOTE — Patient Instructions (Addendum)
Thank you, Ms.[name] for allowing [place] to provide your care today. Today we discussed your recurrent seizures. Please try to get some rest as possible as this is likely making the peidoses worst. I will try to contact Dr. Tera Helper today or tomorrow if there are medications we can add to your Topamax. For now, keep taking the medications as you have.  Please call Dr. Tera Helper office today to make a follow up appointment for recurrence of seizures, interrogation of your stimulator, and potential medication adjustment. This should be done soon.  - No driving - If the seizures get worse or you experience chest pain, worsening mental status, please head to the ED to be evaluated   For your HTN, I did confirm from previous notes, that you should be taking IMDUR 90 (3 pills) twice daily. Please start doing that today to help your number. Keep a log of your blood pressures daily after your morning medications. If consistently high despite this change, call us to make a follow up appointment  You are due for a follow up in Cardiology clinic, please call them to schedule an appointment  I will call if any are abnormal. All of your labs can be accessed through "My Chart".   My Chart Access: [email or web address]  Please follow-up in: 4 weeks for blood pressure control    We look forward to seeing you next time. Please call our clinic at [phone] if you have any questions or concerns. The best time to call is Monday-Friday from 9am-4pm, but there is someone available 24/7. If after hours or the weekend, call the main hospital number and ask for the Internal Medicine Resident On-Call. If you need medication refills, please notify your pharmacy one week in advance and they will send [place] a request.   Thank you for letting us take part in your care. Wishing you the best!  Morene Crocker, MD 11/07/2022, 10:40 AM [facility] Internal Medicine Resident,  PGY-1

## 2022-11-08 NOTE — Progress Notes (Signed)
Internal Medicine Clinic Attending  I saw and evaluated the patient.  I personally confirmed the key portions of the history and exam documented by Dr. Gomez-Caraballo and I reviewed pertinent patient test results.  The assessment, diagnosis, and plan were formulated together and I agree with the documentation in the resident's note.  

## 2022-11-16 DIAGNOSIS — Z4789 Encounter for other orthopedic aftercare: Secondary | ICD-10-CM | POA: Diagnosis not present

## 2022-11-23 DIAGNOSIS — I5032 Chronic diastolic (congestive) heart failure: Secondary | ICD-10-CM | POA: Diagnosis not present

## 2022-11-23 DIAGNOSIS — Z9682 Presence of neurostimulator: Secondary | ICD-10-CM | POA: Diagnosis not present

## 2022-11-23 DIAGNOSIS — I110 Hypertensive heart disease with heart failure: Secondary | ICD-10-CM | POA: Diagnosis not present

## 2022-11-23 DIAGNOSIS — G4733 Obstructive sleep apnea (adult) (pediatric): Secondary | ICD-10-CM | POA: Diagnosis not present

## 2022-11-23 DIAGNOSIS — I10 Essential (primary) hypertension: Secondary | ICD-10-CM | POA: Diagnosis not present

## 2022-11-23 DIAGNOSIS — M797 Fibromyalgia: Secondary | ICD-10-CM | POA: Diagnosis not present

## 2022-11-23 DIAGNOSIS — Z9689 Presence of other specified functional implants: Secondary | ICD-10-CM | POA: Diagnosis not present

## 2022-11-23 DIAGNOSIS — G43709 Chronic migraine without aura, not intractable, without status migrainosus: Secondary | ICD-10-CM | POA: Diagnosis not present

## 2022-12-01 DIAGNOSIS — M25632 Stiffness of left wrist, not elsewhere classified: Secondary | ICD-10-CM | POA: Diagnosis not present

## 2022-12-01 DIAGNOSIS — M79642 Pain in left hand: Secondary | ICD-10-CM | POA: Diagnosis not present

## 2022-12-01 DIAGNOSIS — M25532 Pain in left wrist: Secondary | ICD-10-CM | POA: Diagnosis not present

## 2022-12-01 DIAGNOSIS — M25642 Stiffness of left hand, not elsewhere classified: Secondary | ICD-10-CM | POA: Diagnosis not present

## 2022-12-01 DIAGNOSIS — Z4789 Encounter for other orthopedic aftercare: Secondary | ICD-10-CM | POA: Diagnosis not present

## 2022-12-04 DIAGNOSIS — G4733 Obstructive sleep apnea (adult) (pediatric): Secondary | ICD-10-CM | POA: Diagnosis not present

## 2022-12-05 DIAGNOSIS — Z4789 Encounter for other orthopedic aftercare: Secondary | ICD-10-CM | POA: Diagnosis not present

## 2022-12-13 DIAGNOSIS — M25642 Stiffness of left hand, not elsewhere classified: Secondary | ICD-10-CM | POA: Diagnosis not present

## 2022-12-13 DIAGNOSIS — M25532 Pain in left wrist: Secondary | ICD-10-CM | POA: Diagnosis not present

## 2022-12-13 DIAGNOSIS — Z4789 Encounter for other orthopedic aftercare: Secondary | ICD-10-CM | POA: Diagnosis not present

## 2022-12-13 DIAGNOSIS — M25632 Stiffness of left wrist, not elsewhere classified: Secondary | ICD-10-CM | POA: Diagnosis not present

## 2022-12-19 DIAGNOSIS — Z4789 Encounter for other orthopedic aftercare: Secondary | ICD-10-CM | POA: Diagnosis not present

## 2023-01-02 DIAGNOSIS — Z4789 Encounter for other orthopedic aftercare: Secondary | ICD-10-CM | POA: Diagnosis not present

## 2023-01-02 DIAGNOSIS — M25632 Stiffness of left wrist, not elsewhere classified: Secondary | ICD-10-CM | POA: Diagnosis not present

## 2023-01-02 DIAGNOSIS — M25532 Pain in left wrist: Secondary | ICD-10-CM | POA: Diagnosis not present

## 2023-01-02 DIAGNOSIS — M79642 Pain in left hand: Secondary | ICD-10-CM | POA: Diagnosis not present

## 2023-01-02 DIAGNOSIS — M25642 Stiffness of left hand, not elsewhere classified: Secondary | ICD-10-CM | POA: Diagnosis not present

## 2023-01-08 DIAGNOSIS — M47812 Spondylosis without myelopathy or radiculopathy, cervical region: Secondary | ICD-10-CM | POA: Diagnosis not present

## 2023-01-08 DIAGNOSIS — G894 Chronic pain syndrome: Secondary | ICD-10-CM | POA: Diagnosis not present

## 2023-01-08 DIAGNOSIS — M7062 Trochanteric bursitis, left hip: Secondary | ICD-10-CM | POA: Diagnosis not present

## 2023-01-08 DIAGNOSIS — M545 Low back pain, unspecified: Secondary | ICD-10-CM | POA: Diagnosis not present

## 2023-01-08 DIAGNOSIS — Z79899 Other long term (current) drug therapy: Secondary | ICD-10-CM | POA: Diagnosis not present

## 2023-01-09 ENCOUNTER — Telehealth: Payer: Self-pay | Admitting: Cardiovascular Disease

## 2023-01-09 ENCOUNTER — Other Ambulatory Visit: Payer: Self-pay

## 2023-01-09 ENCOUNTER — Encounter (HOSPITAL_COMMUNITY): Payer: Self-pay

## 2023-01-09 ENCOUNTER — Observation Stay (HOSPITAL_COMMUNITY)
Admission: EM | Admit: 2023-01-09 | Discharge: 2023-01-12 | Disposition: A | Discharge status: Home / Self Care | Payer: 59 | Source: Home / Self Care | Attending: Emergency Medicine | Admitting: Emergency Medicine

## 2023-01-09 ENCOUNTER — Emergency Department (HOSPITAL_COMMUNITY): Payer: 59

## 2023-01-09 DIAGNOSIS — E559 Vitamin D deficiency, unspecified: Secondary | ICD-10-CM | POA: Diagnosis not present

## 2023-01-09 DIAGNOSIS — I110 Hypertensive heart disease with heart failure: Secondary | ICD-10-CM | POA: Diagnosis not present

## 2023-01-09 DIAGNOSIS — M79642 Pain in left hand: Secondary | ICD-10-CM | POA: Diagnosis not present

## 2023-01-09 DIAGNOSIS — I10 Essential (primary) hypertension: Secondary | ICD-10-CM | POA: Diagnosis present

## 2023-01-09 DIAGNOSIS — R2681 Unsteadiness on feet: Secondary | ICD-10-CM | POA: Insufficient documentation

## 2023-01-09 DIAGNOSIS — E2740 Unspecified adrenocortical insufficiency: Secondary | ICD-10-CM | POA: Diagnosis not present

## 2023-01-09 DIAGNOSIS — M25642 Stiffness of left hand, not elsewhere classified: Secondary | ICD-10-CM | POA: Diagnosis not present

## 2023-01-09 DIAGNOSIS — Z1152 Encounter for screening for COVID-19: Secondary | ICD-10-CM | POA: Diagnosis not present

## 2023-01-09 DIAGNOSIS — Z91040 Latex allergy status: Secondary | ICD-10-CM | POA: Insufficient documentation

## 2023-01-09 DIAGNOSIS — M25532 Pain in left wrist: Secondary | ICD-10-CM | POA: Diagnosis not present

## 2023-01-09 DIAGNOSIS — M797 Fibromyalgia: Secondary | ICD-10-CM | POA: Diagnosis present

## 2023-01-09 DIAGNOSIS — F32A Depression, unspecified: Secondary | ICD-10-CM | POA: Diagnosis present

## 2023-01-09 DIAGNOSIS — R0602 Shortness of breath: Principal | ICD-10-CM | POA: Insufficient documentation

## 2023-01-09 DIAGNOSIS — M25632 Stiffness of left wrist, not elsewhere classified: Secondary | ICD-10-CM | POA: Diagnosis not present

## 2023-01-09 DIAGNOSIS — I5032 Chronic diastolic (congestive) heart failure: Secondary | ICD-10-CM | POA: Insufficient documentation

## 2023-01-09 DIAGNOSIS — Z79899 Other long term (current) drug therapy: Secondary | ICD-10-CM | POA: Diagnosis not present

## 2023-01-09 DIAGNOSIS — E785 Hyperlipidemia, unspecified: Secondary | ICD-10-CM | POA: Diagnosis present

## 2023-01-09 DIAGNOSIS — E039 Hypothyroidism, unspecified: Secondary | ICD-10-CM | POA: Diagnosis not present

## 2023-01-09 DIAGNOSIS — R079 Chest pain, unspecified: Secondary | ICD-10-CM | POA: Diagnosis present

## 2023-01-09 DIAGNOSIS — K219 Gastro-esophageal reflux disease without esophagitis: Secondary | ICD-10-CM | POA: Diagnosis present

## 2023-01-09 DIAGNOSIS — Z4789 Encounter for other orthopedic aftercare: Secondary | ICD-10-CM | POA: Diagnosis not present

## 2023-01-09 DIAGNOSIS — F332 Major depressive disorder, recurrent severe without psychotic features: Secondary | ICD-10-CM | POA: Diagnosis present

## 2023-01-09 DIAGNOSIS — N200 Calculus of kidney: Secondary | ICD-10-CM | POA: Diagnosis not present

## 2023-01-09 LAB — BASIC METABOLIC PANEL
Anion gap: 11 (ref 5–15)
BUN: 14 mg/dL (ref 6–20)
CO2: 16 mmol/L — ABNORMAL LOW (ref 22–32)
Calcium: 8.6 mg/dL — ABNORMAL LOW (ref 8.9–10.3)
Chloride: 113 mmol/L — ABNORMAL HIGH (ref 98–111)
Creatinine, Ser: 0.84 mg/dL (ref 0.44–1.00)
GFR, Estimated: >60 mL/min (ref >60)
Glucose, Bld: 86 mg/dL (ref 70–99)
Potassium: 3.5 mmol/L (ref 3.5–5.1)
Sodium: 140 mmol/L (ref 135–145)

## 2023-01-09 LAB — CBC
HCT: 39.8 % (ref 36.0–46.0)
Hemoglobin: 12.2 g/dL (ref 12.0–15.0)
MCH: 27.2 pg (ref 26.0–34.0)
MCHC: 30.7 g/dL (ref 30.0–36.0)
MCV: 88.6 fL (ref 80.0–100.0)
Platelets: 196 K/uL (ref 150–400)
RBC: 4.49 MIL/uL (ref 3.87–5.11)
RDW: 13.6 % (ref 11.5–15.5)
WBC: 6 K/uL (ref 4.0–10.5)
nRBC: 0 % (ref 0.0–0.2)

## 2023-01-09 LAB — BRAIN NATRIURETIC PEPTIDE: B Natriuretic Peptide: 8.8 pg/mL (ref 0.0–100.0)

## 2023-01-09 LAB — TROPONIN I (HIGH SENSITIVITY): Troponin I (High Sensitivity): 4 ng/L

## 2023-01-09 NOTE — ED Provider Notes (Signed)
[facility]-EMER[facility]ENCY DEPT [facility] Emergency Department Provider Note MRN:  [id number]  Arrival date & time: 01/10/23     Chief Complaint   Chest Pain   History of Present Illness   [name] is a 57 y.o. year-old female presents to the ED with chief complaint of chest pain and SOB.  Has hx of CHF.  States that she feels weak, tired, and like she is "dragging."  Onset of symptoms was about 2 days ago.  Reports associated leg pain and swelling and SOB.  States that she has had some cough.  States that it feels like her heart is racing.  States that she feels anxious.  History provided by patient.   Review of Systems  Pertinent positive and negative review of systems noted in HPI.    Physical Exam   Vitals:   01/10/23 0030 01/10/23 0051  BP: (!) 126/92   Pulse: 72   Resp: (!) 23   Temp:  98.3 F (36.8 C)  SpO2: 100%     CONSTITUTIONAL:  well-appearing, NAD NEURO:  Alert and oriented x 3, CN 3-12 grossly intact EYES:  eyes equal and reactive ENT/NECK:  Supple, no stridor  CARDIO:  normal rate, regular rhythm, appears well-perfused  PULM:  No respiratory distress, CTAB GI/GU:  non-distended,  MSK/SPINE:  No gross deformities, no edema, moves all extremities  SKIN:  no rash, atraumatic   *Additional and/or pertinent findings included in MDM below  Diagnostic and Interventional Summary    EKG Interpretation Date/Time:    Ventricular Rate:    PR Interval:    QRS Duration:    QT Interval:    QTC Calculation:   R Axis:      Text Interpretation:         Labs Reviewed  BASIC METABOLIC PANEL - Abnormal; Notable for the following components:      Result Value   Chloride 113 (*)    CO2 16 (*)    Calcium 8.6 (*)    All other components within normal limits  SARS CORONAVIRUS 2 BY RT PCR  CBC  BRAIN NATRIURETIC PEPTIDE  TROPONIN I (HIGH SENSITIVITY)  TROPONIN I (HIGH SENSITIVITY)    CT Angio Chest PE W and/or Wo Contrast  Final Result    DG Chest 2  View  Final Result      Medications  iohexol (OMNIPAQUE) 350 MG/ML injection 75 mL (75 mLs Intravenous Contrast Given 01/10/23 0153)     Procedures  /  Critical Care Procedures  ED Course and Medical Decision Making  I have reviewed the triage vital signs, the nursing notes, and pertinent available records from the EMR.  Social Determinants Affecting Complexity of Care: Patient has no clinically significant social determinants affecting this chief complaint..   ED Course:    Medical Decision Making Patient here with shortness of breath and chest pain.  She says that she has been having the symptoms for the past 2 days.  Symptoms have been gradually worsening.  She says that she has been taking her Lasix.  COVID test negative.  Labs are fairly reassuring, but when patient ambulates her O2 saturation drops to 88% and she feel short of breath.  She still complains of chest pain and shortness of breath.  Given her oxygen dropping with ambulation and persistent symptoms, feel that she should be admitted to the hospital.  Amount and/or Complexity of Data Reviewed Labs: ordered. Radiology: ordered.  Risk Prescription drug management. Decision regarding hospitalization.  Consultants: I consulted with Hospitalist, Dr. Julian Reil, who is appreciated for admitting.   Treatment and Plan: Patient's exam and diagnostic results are concerning for SOB with hypoxia with ambulation.  Feel that patient will need admission to the hospital for further treatment and evaluation.    Final Clinical Impressions(s) / ED Diagnoses     ICD-10-CM   1. SOB (shortness of breath)  R06.02       ED Discharge Orders     None         Discharge Instructions Discussed with and Provided to Patient:   Discharge Instructions   None      Roxy Horseman, PA-C 01/10/23 0240    Melene Plan, DO 01/10/23 [phone]

## 2023-01-09 NOTE — Telephone Encounter (Signed)
Returned call to pt. Pt is SOB and fatigued this morning. Has been going on for 2 days now. Pt has swelling in both legs with pain. Does not have a way to check O2. She felt like she got choked yesterday and a bubble came up in her throat. Had chest pain yesterday, she took a nitroglycerin and put a nitroglycerin patch on as well with little help. Still having CP today. Pt states she is scared because she has been treated for clots before as well.  Pt. BP is 136/100 and HR 188. Advised pt that she needs to go to the ER. Pt verbalized understanding.

## 2023-01-09 NOTE — ED Triage Notes (Addendum)
Pt reports shortness of breath and chest pain that started "a few days ago." She states she took her BP and noticed her HR was 188 today around 10:30 am. She reports feeling weak, dizzy, and states her legs are swollen. HR 100 in triage. Hx of heart failure and cardiomyopathy.

## 2023-01-09 NOTE — ED Notes (Signed)
Pt O2 dropped to 88% during ambulation, when seated O2 came back up to 100%

## 2023-01-09 NOTE — Telephone Encounter (Signed)
Pt states she has been swelling in her legs, palpitations, and SOB (not currently). Pt states she has also been very fatigue. Please advise.   Pt states she is okay with switching to Dr. Scharlene Gloss since Dr. Shari Prows has left.

## 2023-01-10 ENCOUNTER — Encounter (HOSPITAL_COMMUNITY): Payer: Self-pay | Admitting: Internal Medicine

## 2023-01-10 ENCOUNTER — Observation Stay (HOSPITAL_BASED_OUTPATIENT_CLINIC_OR_DEPARTMENT_OTHER): Payer: 59

## 2023-01-10 ENCOUNTER — Other Ambulatory Visit: Payer: Self-pay

## 2023-01-10 ENCOUNTER — Observation Stay (HOSPITAL_COMMUNITY): Payer: 59

## 2023-01-10 ENCOUNTER — Emergency Department (HOSPITAL_COMMUNITY): Payer: 59

## 2023-01-10 DIAGNOSIS — M79661 Pain in right lower leg: Secondary | ICD-10-CM

## 2023-01-10 DIAGNOSIS — R0602 Shortness of breath: Secondary | ICD-10-CM | POA: Diagnosis not present

## 2023-01-10 DIAGNOSIS — N200 Calculus of kidney: Secondary | ICD-10-CM | POA: Diagnosis not present

## 2023-01-10 LAB — BASIC METABOLIC PANEL
Anion gap: 10 (ref 5–15)
BUN: 11 mg/dL (ref 6–20)
CO2: 20 mmol/L — ABNORMAL LOW (ref 22–32)
Calcium: 8.8 mg/dL — ABNORMAL LOW (ref 8.9–10.3)
Chloride: 109 mmol/L (ref 98–111)
Creatinine, Ser: 0.77 mg/dL (ref 0.44–1.00)
GFR, Estimated: >60 mL/min (ref >60)
Glucose, Bld: 92 mg/dL (ref 70–99)
Potassium: 3.9 mmol/L (ref 3.5–5.1)
Sodium: 139 mmol/L (ref 135–145)

## 2023-01-10 LAB — CK: Total CK: 140 U/L (ref 38–234)

## 2023-01-10 LAB — CBC
HCT: 40 % (ref 36.0–46.0)
Hemoglobin: 12.6 g/dL (ref 12.0–15.0)
MCH: 27.9 pg (ref 26.0–34.0)
MCHC: 31.5 g/dL (ref 30.0–36.0)
MCV: 88.7 fL (ref 80.0–100.0)
Platelets: 192 10*3/uL (ref 150–400)
RBC: 4.51 MIL/uL (ref 3.87–5.11)
RDW: 13.5 % (ref 11.5–15.5)
WBC: 4.3 10*3/uL (ref 4.0–10.5)
nRBC: 0 % (ref 0.0–0.2)

## 2023-01-10 LAB — HIV ANTIBODY (ROUTINE TESTING W REFLEX): HIV Screen 4th Generation wRfx: NONREACTIVE

## 2023-01-10 LAB — TROPONIN I (HIGH SENSITIVITY): Troponin I (High Sensitivity): 4 ng/L (ref <18)

## 2023-01-10 LAB — MAGNESIUM: Magnesium: 2.2 mg/dL (ref 1.7–2.4)

## 2023-01-10 LAB — TSH: TSH: 2.287 u[IU]/mL (ref 0.350–4.500)

## 2023-01-10 MED ORDER — IPRATROPIUM-ALBUTEROL 0.5-2.5 (3) MG/3ML IN SOLN
3.0000 mL | RESPIRATORY_TRACT | Status: DC | PRN
  Administered 2023-01-10: 3 mL via RESPIRATORY_TRACT
  Filled 2023-01-10: qty 3

## 2023-01-10 MED ORDER — FAMOTIDINE 20 MG PO TABS
20.0000 mg | ORAL_TABLET | Freq: Two times a day (BID) | ORAL | Status: DC
  Administered 2023-01-10 – 2023-01-12 (×5): 20 mg via ORAL
  Filled 2023-01-10 (×5): qty 1

## 2023-01-10 MED ORDER — METOPROLOL TARTRATE 5 MG/5ML IV SOLN: 5.0000 mg | INTRAVENOUS | Status: DC | PRN

## 2023-01-10 MED ORDER — ISOSORBIDE MONONITRATE ER 60 MG PO TB24
90.0000 mg | ORAL_TABLET | Freq: Two times a day (BID) | ORAL | Status: DC
  Administered 2023-01-10 – 2023-01-12 (×5): 90 mg via ORAL
  Filled 2023-01-10: qty 3
  Filled 2023-01-10 (×4): qty 1
  Filled 2023-01-10: qty 3

## 2023-01-10 MED ORDER — PANTOPRAZOLE SODIUM 40 MG PO TBEC
40.0000 mg | DELAYED_RELEASE_TABLET | Freq: Every day | ORAL | Status: DC
  Administered 2023-01-10 – 2023-01-12 (×3): 40 mg via ORAL
  Filled 2023-01-10 (×3): qty 1

## 2023-01-10 MED ORDER — REVEFENACIN 175 MCG/3ML IN SOLN: 175.0000 ug | Freq: Every day | RESPIRATORY_TRACT | Status: DC

## 2023-01-10 MED ORDER — ACETAMINOPHEN 325 MG PO TABS: 650.0000 mg | ORAL_TABLET | Freq: Four times a day (QID) | ORAL | Status: DC | PRN

## 2023-01-10 MED ORDER — LEVOTHYROXINE SODIUM 50 MCG PO TABS
50.0000 ug | ORAL_TABLET | Freq: Every day | ORAL | Status: DC
  Administered 2023-01-10 – 2023-01-12 (×3): 50 ug via ORAL
  Filled 2023-01-10 (×2): qty 1
  Filled 2023-01-10: qty 2

## 2023-01-10 MED ORDER — TRAZODONE HCL 50 MG PO TABS: 50.0000 mg | ORAL_TABLET | Freq: Every evening | ORAL | Status: DC | PRN

## 2023-01-10 MED ORDER — IOHEXOL 350 MG/ML SOLN
75.0000 mL | Freq: Once | INTRAVENOUS | Status: AC | PRN
  Administered 2023-01-10: 75 mL via INTRAVENOUS

## 2023-01-10 MED ORDER — PREDNISONE 20 MG PO TABS
40.0000 mg | ORAL_TABLET | Freq: Every day | ORAL | Status: DC
  Administered 2023-01-10: 40 mg via ORAL
  Filled 2023-01-10: qty 2

## 2023-01-10 MED ORDER — ENOXAPARIN SODIUM 40 MG/0.4ML IJ SOSY
40.0000 mg | PREFILLED_SYRINGE | INTRAMUSCULAR | Status: DC
  Administered 2023-01-10: 40 mg via SUBCUTANEOUS
  Filled 2023-01-10: qty 0.4

## 2023-01-10 MED ORDER — MILNACIPRAN HCL 12.5 MG PO TABS: 12.5000 mg | ORAL_TABLET | Freq: Two times a day (BID) | ORAL | Status: DC

## 2023-01-10 MED ORDER — HYDRALAZINE HCL 20 MG/ML IJ SOLN: 10.0000 mg | INTRAMUSCULAR | Status: DC | PRN

## 2023-01-10 MED ORDER — SPIRONOLACTONE 25 MG PO TABS
25.0000 mg | ORAL_TABLET | Freq: Every day | ORAL | Status: DC
  Administered 2023-01-10 – 2023-01-12 (×3): 25 mg via ORAL
  Filled 2023-01-10 (×2): qty 1
  Filled 2023-01-10: qty 2
  Filled 2023-01-10: qty 1

## 2023-01-10 MED ORDER — IPRATROPIUM-ALBUTEROL 0.5-2.5 (3) MG/3ML IN SOLN
3.0000 mL | Freq: Four times a day (QID) | RESPIRATORY_TRACT | Status: DC | PRN
  Administered 2023-01-11: 3 mL via RESPIRATORY_TRACT
  Filled 2023-01-10: qty 3

## 2023-01-10 MED ORDER — AMITRIPTYLINE HCL 50 MG PO TABS
25.0000 mg | ORAL_TABLET | Freq: Every day | ORAL | Status: DC
  Administered 2023-01-10 – 2023-01-11 (×2): 25 mg via ORAL
  Filled 2023-01-10 (×2): qty 1

## 2023-01-10 MED ORDER — METHOCARBAMOL 500 MG PO TABS: 750.0000 mg | ORAL_TABLET | Freq: Three times a day (TID) | ORAL | Status: DC | PRN

## 2023-01-10 MED ORDER — SENNOSIDES-DOCUSATE SODIUM 8.6-50 MG PO TABS: 1.0000 | ORAL_TABLET | Freq: Every evening | ORAL | Status: DC | PRN

## 2023-01-10 MED ORDER — FUROSEMIDE 20 MG PO TABS: 20.0000 mg | ORAL_TABLET | Freq: Every day | ORAL | Status: DC | PRN

## 2023-01-10 MED ORDER — CARVEDILOL 12.5 MG PO TABS
12.5000 mg | ORAL_TABLET | Freq: Two times a day (BID) | ORAL | Status: DC
  Administered 2023-01-10 – 2023-01-12 (×5): 12.5 mg via ORAL
  Filled 2023-01-10 (×6): qty 1

## 2023-01-10 MED ORDER — GUAIFENESIN 100 MG/5ML PO LIQD: 5.0000 mL | ORAL | Status: DC | PRN

## 2023-01-10 MED ORDER — TOPIRAMATE 100 MG PO TABS
200.0000 mg | ORAL_TABLET | Freq: Two times a day (BID) | ORAL | Status: DC
  Administered 2023-01-10 – 2023-01-12 (×5): 200 mg via ORAL
  Filled 2023-01-10: qty 8
  Filled 2023-01-10 (×4): qty 2

## 2023-01-10 MED ORDER — RIVAROXABAN 10 MG PO TABS
10.0000 mg | ORAL_TABLET | Freq: Every day | ORAL | Status: DC
  Administered 2023-01-11 – 2023-01-12 (×2): 10 mg via ORAL
  Filled 2023-01-10 (×2): qty 1

## 2023-01-10 MED ORDER — POTASSIUM CHLORIDE CRYS ER 20 MEQ PO TBCR
40.0000 meq | EXTENDED_RELEASE_TABLET | Freq: Once | ORAL | Status: AC
  Administered 2023-01-10: 40 meq via ORAL
  Filled 2023-01-10: qty 2

## 2023-01-10 MED ORDER — GABAPENTIN 300 MG PO CAPS
600.0000 mg | ORAL_CAPSULE | Freq: Three times a day (TID) | ORAL | Status: DC
  Administered 2023-01-10 – 2023-01-12 (×7): 600 mg via ORAL
  Filled 2023-01-10 (×7): qty 2

## 2023-01-10 MED ORDER — ARFORMOTEROL TARTRATE 15 MCG/2ML IN NEBU: 15.0000 ug | INHALATION_SOLUTION | Freq: Two times a day (BID) | RESPIRATORY_TRACT | Status: DC

## 2023-01-10 MED ORDER — IPRATROPIUM-ALBUTEROL 0.5-2.5 (3) MG/3ML IN SOLN
RESPIRATORY_TRACT | Status: AC
  Filled 2023-01-10: qty 3
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
## 2023-01-10 NOTE — Evaluation (Signed)
Physical Therapy Evaluation Patient Details Name: [name] MRN: [id number] DOB: [birth year] Today's Date: 01/10/2023  History of Present Illness  Pt is a 56 y.o. F who presents 01/09/2023 with chest tightness and shortness of breath. CTA chest negative. Significant PMH: HTN, diastolic CHF EF 70%, fibromyalgia, depression, MI, left wrist surgery.  Clinical Impression  PTA, pt lives in a town home with her 52 y.o. daughter who has autism and ambulates using a quad cane vs a Rollator. Pt reports continued constant chest tightness and BLE tenderness, but states BLE swelling has improved. Pt ambulating 60 ft with a quad cane at a supervision level. HR 80-89 bpm, SpO2 99% on RA. Displays decreased cardiopulmonary endurance, balance deficits, and weakness. Would benefit from HHPT at d/c.      If plan is discharge home, recommend the following: Assist for transportation;Assistance with cooking/housework   Can travel by private vehicle        Equipment Recommendations None recommended by PT  Recommendations for Other Services       Functional Status Assessment Patient has had a recent decline in their functional status and demonstrates the ability to make significant improvements in function in a reasonable and predictable amount of time.     Precautions / Restrictions Precautions Precautions: Fall Restrictions Weight Bearing Restrictions: No      Mobility  Bed Mobility Overal bed mobility: Modified Independent                  Transfers Overall transfer level: Modified independent Equipment used: Quad cane                    Ambulation/Gait Ambulation/Gait assistance: Chief Operating Officer (Feet): 60 Feet Assistive device: Quad cane Gait Pattern/deviations: Step-through pattern, Decreased stride length Gait velocity: decreased Gait velocity interpretation: <1.31 ft/sec, indicative of household ambulator   General Gait Details: Increased R foot external  rotation, slow pace, mild dynamic instability  Stairs            Wheelchair Mobility     Tilt Bed    Modified Rankin (Stroke Patients Only)       Balance Overall balance assessment: Needs assistance Sitting-balance support: Feet supported Sitting balance-Leahy Scale: Good     Standing balance support: Single extremity supported, During functional activity Standing balance-Leahy Scale: Fair                               Pertinent Vitals/Pain Pain Assessment Pain Assessment: Faces Faces Pain Scale: Hurts little more Pain Location: BLE's, chest Pain Descriptors / Indicators: Tender, Tightness Pain Intervention(s): Limited activity within patient's tolerance, Monitored during session    Home Living Family/patient expects to be discharged to:: Private residence Living Arrangements: Children (106 y.o. daughter with autism) Available Help at Discharge: Family Type of Home: Other(Comment) (townhome) Home Access: Level entry       Home Layout: Able to live on main level with bedroom/bathroom Home Equipment: Architectural technologist (4 wheels)      Prior Function Prior Level of Function : Independent/Modified Independent;Driving             Mobility Comments: Uses quad cane for household ambulation, Rollator for community distances. limited community ambulator       Extremity/Trunk Assessment   Upper Extremity Assessment Upper Extremity Assessment: Generalized weakness    Lower Extremity Assessment Lower Extremity Assessment: Generalized weakness       Communication   Communication  Communication: No apparent difficulties  Cognition Arousal: Alert Behavior During Therapy: WFL for tasks assessed/performed Overall Cognitive Status: Within Functional Limits for tasks assessed                                          General Comments      Exercises     Assessment/Plan    PT Assessment Patient needs continued PT  services  PT Problem List Decreased activity tolerance;Decreased balance;Decreased mobility;Pain       PT Treatment Interventions DME instruction;Gait training;Stair training;Functional mobility training;Therapeutic activities;Therapeutic exercise;Balance training;Patient/family education    PT Goals (Current goals can be found in the Care Plan section)  Acute Rehab PT Goals Patient Stated Goal: increased stamina PT Goal Formulation: With patient Time For Goal Achievement: 01/24/23 Potential to Achieve Goals: Good    Frequency Min 1X/week     Co-evaluation               AM-PAC PT "6 Clicks" Mobility  Outcome Measure Help needed turning from your back to your side while in a flat bed without using bedrails?: None Help needed moving from lying on your back to sitting on the side of a flat bed without using bedrails?: None Help needed moving to and from a bed to a chair (including a wheelchair)?: None Help needed standing up from a chair using your arms (e.g., wheelchair or bedside chair)?: None Help needed to walk in hospital room?: A Little Help needed climbing 3-5 steps with a railing? : A Lot 6 Click Score: 21    End of Session   Activity Tolerance: Patient tolerated treatment well Patient left: in bed;with call bell/phone within reach;Other (comment) (with MD) Nurse Communication: Mobility status;Other (comment) (pt requesting breathing treatment) PT Visit Diagnosis: Unsteadiness on feet (R26.81);Difficulty in walking, not elsewhere classified (R26.2)    Time: 0102-7253 PT Time Calculation (min) (ACUTE ONLY): 24 min   Charges:   PT Evaluation $PT Eval Low Complexity: 1 Low PT Treatments $Therapeutic Activity: 8-22 mins PT General Charges $$ ACUTE PT VISIT: 1 Visit         Lillia Pauls, PT, [name] Acute Rehabilitation Services Office [phone]   Norval Morton 01/10/2023, 3:01 PM
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
## 2023-01-10 NOTE — Progress Notes (Signed)
HD#0 Subjective:  Overnight Events: mildly hypertensive  [name] reports that her breathing feels about the same today. [name] worked with therapy and was able to walk without chest pain. [name] did have one episode of central chest pain this morning that resolved on its own that felt similar to episodes at home. [name] denies feeling dizzy with ambulation. Her shortness of breath is present at rest. [name] has noted some tenderness to touch in her light lower extremity.  Pt is updated on the plan for today, and all questions and concerns are addressed.   Objective:  Vital signs in last 24 hours: Vitals:   01/10/23 0800 01/10/23 0900 01/10/23 0925 01/10/23 1109  BP: (!) 129/96 133/87  (!) 121/100  Pulse: 80 74  69  Resp: 18 (!) 24  18  Temp:   98.5 F (36.9 C) 98.4 F (36.9 C)  TempSrc:      SpO2: 100% 100%  100%  Weight:      Height:       Supplemental O2: Room Air SpO2: 100 %   Physical Exam:  Constitutional: well-appearing, in no acute distress Cardiovascular: regular rate and rhythm, no m/r/g Pulmonary/Chest: normal work of breathing on room air, lungs clear to auscultation bilaterally Abdominal: soft, non-tender, non-distended MSK: no lower extremity edema present bilaterally, no erythema or ecchymosis, [name] endorses pain with movement of sock on left foot but no visible skin changes Skin: warm and dry Psych: anxious  Filed Weights   01/09/23 1953  Weight: 100.7 kg    No intake or output data in the 24 hours ending 01/10/23 1138 Net IO Since Admission: No IO data has been entered for this period [01/10/23 1138]  Pertinent Labs:    Latest Ref Rng & Units 01/10/2023   10:17 AM 01/09/2023    8:03 PM 04/11/2022    3:46 PM  CBC  WBC 4.0 - 10.5 K/uL 4.3  6.0  4.9   Hemoglobin 12.0 - 15.0 g/dL 47.8  29.5  62.1   Hematocrit 36.0 - 46.0 % 40.0  39.8  40.4   Platelets 150 - 400 K/uL 192  196  181        Latest Ref Rng & Units 01/10/2023   10:17 AM 01/09/2023    8:03 PM 05/24/2022    10:34 AM  CMP  Glucose 70 - 99 mg/dL 92  86  87   BUN 6 - 20 mg/dL 11  14  12    Creatinine 0.44 - 1.00 mg/dL 3.08  6.57  8.46   Sodium 135 - 145 mmol/L 139  140  143   Potassium 3.5 - 5.1 mmol/L 3.9  3.5  4.1   Chloride 98 - 111 mmol/L 109  113  109   CO2 22 - 32 mmol/L 20  16  21    Calcium 8.9 - 10.3 mg/dL 8.8  8.6  8.9   Total Protein 6.0 - 8.5 g/dL   6.3   Total Bilirubin 0.0 - 1.2 mg/dL   0.4   Alkaline Phos 44 - 121 IU/L   159   AST 0 - 40 IU/L   20   ALT 0 - 32 IU/L   30     Imaging: CTA PR without evidence of pulmonary embolism   Assessment/Plan:   Principal Problem:   Shortness of breath Active Problems:   HTN, goal below 130/80   Dyslipidemia, goal LDL below 70   GERD (gastroesophageal reflux disease)   Chronic diastolic heart failure (HCC)  Chest pain   Depression   Fibromyalgia   SOB (shortness of breath)   Patient Summary: [name] is a 57 y.o. 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   Dyspnea Fatigue Patient presented with several days of dyspnea and 1 week of fatigue.  [name] was concerned as her blood pressures and heart rates at home have been elevated. [name] has also noted increased episodes of chest pain. Initial evaluation showed no ischemic changes on EKG and troponin were normal. Her blood pressure and heart rate during admission have been fairly well controlled. No evidence of DVT or PE on U/S and CT PE. [name] is euvolemic on exam without wheezing. Hgb was within normal limits. TSH within normal limits. [name] was able to work with PT today and had stable vitals. I do not think [name] is volume overloaded or having COPD exacerbation. Will get repeat echo to assess for valvular change, but no murmur on exam. Pending what that shows I think [name] could likely discharge tomorrow with home  health PT to help regain strength. With her history of gastric bypass will check vitamin D as well. -telemetry -echo pending -check vitamin D, B12 -PFTs as outpatient -follow-up cardiology as outpatient -stop steroids and nebs  AGMA Bicarb 20 with no AG. [name] denies recent diarrhea. This could be related to topiramate that [name] takes for epilepsy. This is not a new medication for her. Acidosis improved from yesterdays labs. -trend BMP  Chronic conditions: HFpEF Home medications include carvedilol 12.5 mg BID, Imdur 90 mg BID, spironolactone 25 mg Lasix 20 mg PRN Fibromyalgia -amitriptyline 25 mg Hx of coronary vasospasm LHC in 2023 at [facility] Med showed no CAD. Echo in 2023 wnl. Hypothyroidism -synthroid 50 mcg qd   Diet: Normal VTE: NOAC Code: Full PT/OT recs: Home Health, none.   Dispo: Anticipated discharge to Home in 1 days.  Samayra Hebel M. [place] Steidle, D.O.  Internal Medicine Resident, PGY-3 [facility]  Pager: [phone] 11:38 AM, 01/10/2023   **Please contact the on call pager after 5 pm and on weekends at [phone].**

## 2023-01-10 NOTE — Progress Notes (Signed)
Bilateral lower extremity venous study completed.   Preliminary results relayed to MD.  Please see CV Procedures for preliminary results.  Nyelli Samara, RVT  9:55 AM 01/10/23

## 2023-01-11 ENCOUNTER — Observation Stay (HOSPITAL_BASED_OUTPATIENT_CLINIC_OR_DEPARTMENT_OTHER): Payer: 59

## 2023-01-11 DIAGNOSIS — I428 Other cardiomyopathies: Secondary | ICD-10-CM

## 2023-01-11 DIAGNOSIS — R5383 Other fatigue: Secondary | ICD-10-CM

## 2023-01-11 DIAGNOSIS — R079 Chest pain, unspecified: Secondary | ICD-10-CM | POA: Diagnosis not present

## 2023-01-11 DIAGNOSIS — R0602 Shortness of breath: Secondary | ICD-10-CM | POA: Diagnosis not present

## 2023-01-11 DIAGNOSIS — E2740 Unspecified adrenocortical insufficiency: Secondary | ICD-10-CM

## 2023-01-11 LAB — ACTH STIMULATION, 3 TIME POINTS
Cortisol, 30 Min: 13 ug/dL
Cortisol, 60 Min: 16 ug/dL
Cortisol, Base: 1.3 ug/dL

## 2023-01-11 MED ORDER — COSYNTROPIN 0.25 MG IJ SOLR
0.2500 mg | Freq: Once | INTRAMUSCULAR | Status: AC
  Administered 2023-01-11: 0.25 mg via INTRAVENOUS
  Filled 2023-01-11: qty 0.25

## 2023-01-11 MED ORDER — VITAMIN D 25 MCG (1000 UNIT) PO TABS
1000.0000 [IU] | ORAL_TABLET | Freq: Every day | ORAL | Status: DC
  Administered 2023-01-11 – 2023-01-12 (×2): 1000 [IU] via ORAL
  Filled 2023-01-11 (×2): qty 1

## 2023-01-11 MED ORDER — PERFLUTREN LIPID MICROSPHERE
1.0000 mL | INTRAVENOUS | Status: AC | PRN
  Administered 2023-01-11: 2 mL via INTRAVENOUS
  Filled 2023-01-11: qty 10

## 2023-01-11 NOTE — Progress Notes (Signed)
Subjective: [name] is a 57 y.o. 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 Reports mild pain at lower extremities and chest tightness at sternum   Objective: Vital signs in last 24 hours: Vitals:   01/11/23 0500 01/11/23 0600 01/11/23 0700 01/11/23 1007  BP: (!) 119/90  (!) 135/95 (!) 140/103  Pulse: 77  79 80  Resp: 15 19 (!) 26 20  Temp: 97.6 F (36.4 C)  98 F (36.7 C) (!) 97.5 F (36.4 C)  TempSrc: Oral  Oral Oral  SpO2: 98%  100% 100%  Weight:      Height:       Weight change:   Intake/Output Summary (Last 24 hours) at 01/11/2023 1132 Last data filed at 01/11/2023 0500 Gross per 24 hour  Intake 60 ml  Output 0 ml  Net 60 ml    Physical Exam: BP (!) 140/103 (BP Location: Right Arm)   Pulse 80   Temp (!) 97.5 F (36.4 C) (Oral)   Resp 20   Ht 5\' 5"  (1.651 m)   Wt 100.7 kg   LMP 04/13/2018   SpO2 100%   BMI 36.94 kg/m  General appearance: alert, cooperative, and no distress Head: Normocephalic, without obvious abnormality, atraumatic Lungs: clear to auscultation bilaterally Heart: regular rate and rhythm, S1, S2 normal, no murmur, click, rub or gallop Extremities: extremities normal, atraumatic, no cyanosis or edema Skin: Skin color, texture, turgor normal. No rashes or lesions  Lab Results:    Latest Ref Rng & Units 01/11/2023    1:58 AM 01/10/2023   10:17 AM 01/09/2023    8:03 PM  CBC  WBC 4.0 - 10.5 K/uL 6.8  4.3  6.0   Hemoglobin 12.0 - 15.0 g/dL 13.2  44.0  10.2   Hematocrit 36.0 - 46.0 % 36.9  40.0  39.8   Platelets 150 - 400 K/uL 146  192  196       Latest Ref Rng & Units 01/11/2023    1:58 AM 01/10/2023   10:17 AM 01/09/2023    8:03 PM  CMP  Glucose 70 - 99 mg/dL 96  92  86   BUN 6 - 20 mg/dL 16  11  14     Creatinine 0.44 - 1.00 mg/dL 7.25  3.66  4.40   Sodium 135 - 145 mmol/L 138  139  140   Potassium 3.5 - 5.1 mmol/L 4.2  3.9  3.5   Chloride 98 - 111 mmol/L 112  109  113   CO2 22 - 32 mmol/L 16  20  16    Calcium 8.9 - 10.3 mg/dL 8.7  8.8  8.6    No results for input(s): "GLUCAP" in the last 72 hours.  Micro Results: Recent Results (from the past 240 hour(s))  SARS Coronavirus 2 by RT PCR (hospital order, performed in [facility] hospital lab) *cepheid single result test* Anterior Nasal Swab     Status: None   Collection Time: 01/09/23 11:09 PM   Specimen: Anterior Nasal Swab  Result Value Ref Range Status   SARS Coronavirus 2 by RT PCR NEGATIVE NEGATIVE Final    Comment: Performed at [facility], [address], [place], [place] [address]    Studies/Results: ECHOCARDIOGRAM COMPLETE  Result Date: 01/11/2023    ECHOCARDIOGRAM  REPORT   Patient Name:   [name] Date of Exam: 01/11/2023 Medical Rec #:  [id number]      Height:       65.0 in Accession #:    [id number]     Weight:       222.0 lb Date of Birth:  [birth year]      BSA:          2.068 m Patient Age:    56 years       BP:           135/95 mmHg Patient Gender: F              HR:           78 bpm. Exam Location:  Inpatient Procedure: 2D Echo, Cardiac Doppler, Color Doppler and Intracardiac            Opacification Agent Indications:    Cardiomyopathy, unspecified.  History:        Patient has prior history of Echocardiogram examinations, most                 recent 08/30/2020. Cardiomyopathy and CHF, Previous Myocardial                 Infarction, Signs/Symptoms:Shortness of Breath, Chest Pain and                 Edema; Risk Factors:Sleep Apnea, Obesity and Hypertension. Hx of                 Takatsubo.  Sonographer:    Milda Smart Referring Phys: [id number] ANKIT CHIRAG AMIN  Sonographer Comments: Image acquisition challenging due to respiratory motion. IMPRESSIONS  1. Left ventricular ejection fraction, by estimation, is 55 to  60%. Left ventricular ejection fraction by 2D MOD biplane is 64.0 %. The left ventricle has normal function. The left ventricle has no regional wall motion abnormalities. There is moderate left ventricular hypertrophy. Left ventricular diastolic parameters are consistent with Grade I diastolic dysfunction (impaired relaxation).  2. Right ventricular systolic function is normal. The right ventricular size is normal. Tricuspid regurgitation signal is inadequate for assessing PA pressure.  3. The mitral valve is normal in structure. Trivial mitral valve regurgitation. No evidence of mitral stenosis.  4. The aortic valve is tricuspid. Aortic valve regurgitation is mild. No aortic stenosis is present.  5. The inferior vena cava is normal in size with greater than 50% respiratory variability, suggesting right atrial pressure of 3 mmHg. FINDINGS  Left Ventricle: Left ventricular ejection fraction, by estimation, is 55 to 60%. Left ventricular ejection fraction by 2D MOD biplane is 64.0 %. The left ventricle has normal function. The left ventricle has no regional wall motion abnormalities. Definity contrast agent was given IV to delineate the left ventricular endocardial borders. The left ventricular internal cavity size was normal in size. There is moderate left ventricular hypertrophy. Left ventricular diastolic parameters are consistent  with Grade I diastolic dysfunction (impaired relaxation). Right Ventricle: The right ventricular size is normal. No increase in right ventricular wall thickness. Right ventricular systolic function is normal. Tricuspid regurgitation signal is inadequate for assessing PA pressure. Left Atrium: Left atrial size was normal in size. Right Atrium: Right atrial size was normal in size. Pericardium: Trivial pericardial effusion is present. Mitral Valve: The mitral valve is normal in structure. Trivial mitral valve regurgitation. No evidence of mitral valve stenosis. MV peak gradient, 3.9 mmHg.  The mean mitral valve gradient is 2.0 mmHg. Tricuspid  Valve: The tricuspid valve is normal in structure. Tricuspid valve regurgitation is trivial. No evidence of tricuspid stenosis. Aortic Valve: The aortic valve is tricuspid. Aortic valve regurgitation is mild. No aortic stenosis is present. Pulmonic Valve: The pulmonic valve was normal in structure. Pulmonic valve regurgitation is trivial. No evidence of pulmonic stenosis. Aorta: The aortic root is normal in size and structure. Venous: The inferior vena cava is normal in size with greater than 50% respiratory variability, suggesting right atrial pressure of 3 mmHg. IAS/Shunts: No atrial level shunt detected by color flow Doppler.  LEFT VENTRICLE PLAX 2D                        Biplane EF (MOD) LVIDd:         4.00 cm         LV Biplane EF:   Left LVIDs:         2.60 cm                          ventricular LV PW:         1.30 cm                          ejection LV IVS:        1.30 cm                          fraction by LVOT diam:     2.00 cm                          2D MOD LV SV:         84                               biplane is LV SV Index:   41                               64.0 %. LVOT Area:     3.14 cm                                Diastology                                LV e' medial:    5.44 cm/s LV Volumes (MOD)               LV E/e' medial:  10.8 LV vol d, MOD    131.0 ml      LV e' lateral:   6.64 cm/s A2C:                           LV E/e' lateral: 8.9 LV vol d, MOD    117.0 ml A4C: LV vol s, MOD    48.5 ml A2C: LV vol s, MOD    41.4 ml A4C: LV SV MOD A2C:   82.5 ml LV SV MOD A4C:   117.0 ml LV SV MOD BP:    79.9 ml RIGHT VENTRICLE  IVC RV Basal diam:  3.20 cm     IVC diam: 1.90 cm RV S prime:     11.40 cm/s TAPSE (M-mode): 2.2 cm LEFT ATRIUM             Index        RIGHT ATRIUM          Index LA diam:        3.90 cm 1.89 cm/m   RA Area:     9.38 cm LA Vol (A2C):   48.3 ml 23.36 ml/m  RA Volume:   18.40 ml 8.90 ml/m LA Vol (A4C):    46.2 ml 22.34 ml/m LA Biplane Vol: 48.8 ml 23.60 ml/m  AORTIC VALVE LVOT Vmax:   128.00 cm/s LVOT Vmean:  96.400 cm/s LVOT VTI:    0.267 m  AORTA Ao Root diam: 2.50 cm Ao Asc diam:  2.90 cm MITRAL VALVE MV Area (PHT): 3.12 cm    SHUNTS MV Area VTI:   4.07 cm    Systemic VTI:  0.27 m MV Peak grad:  3.9 mmHg    Systemic Diam: 2.00 cm MV Mean grad:  2.0 mmHg MV Vmax:       0.99 m/s MV Vmean:      63.3 cm/s MV Decel Time: 243 msec MR Peak grad: 23.9 mmHg MR Vmax:      244.50 cm/s MV E velocity: 58.80 cm/s MV A velocity: 66.50 cm/s MV E/A ratio:  0.88 Weston Brass MD Electronically signed by Weston Brass MD Signature Date/Time: 01/11/2023/10:40:32 AM    Final    VAS [place] LOWER EXTREMITY VENOUS (DVT)  Result Date: 01/10/2023  Lower Venous DVT Study Patient Name:  [name]  Date of Exam:   01/10/2023 Medical Rec #: [id number]       Accession #:    [id number] Date of Birth: 1966-03-11       Patient Gender: F Patient Age:   52 years Exam Location:  [facility] Procedure:      VAS [place] LOWER EXTREMITY VENOUS (DVT) Referring Phys: Stephania Fragmin --------------------------------------------------------------------------------  Indications: Pain, Swelling, SOB, and CHF.  Limitations: Body habitus and poor ultrasound/tissue interface. Comparison Study: Previous LLE 09/08/21 negative.                   No previous RLE. Performing Technologist: McKayla Maag RVT, VT  Examination Guidelines: A complete evaluation includes B-mode imaging, spectral Doppler, color Doppler, and power Doppler as needed of all accessible portions of each vessel. Bilateral testing is considered an integral part of a complete examination. Limited examinations for reoccurring indications may be performed as noted. The reflux portion of the exam is performed with the patient in reverse Trendelenburg.  +---------+---------------+---------+-----------+----------+--------------+ RIGHT    CompressibilityPhasicitySpontaneityPropertiesThrombus  Aging +---------+---------------+---------+-----------+----------+--------------+ CFV      Full           Yes      Yes                                 +---------+---------------+---------+-----------+----------+--------------+ SFJ      Full                                                        +---------+---------------+---------+-----------+----------+--------------+ FV Prox  Full                                                        +---------+---------------+---------+-----------+----------+--------------+  FV Mid   Full                                                        +---------+---------------+---------+-----------+----------+--------------+ FV DistalFull                                                        +---------+---------------+---------+-----------+----------+--------------+ PFV      Full                                                        +---------+---------------+---------+-----------+----------+--------------+ POP      Full           Yes      Yes                                 +---------+---------------+---------+-----------+----------+--------------+ PTV      Full                                                        +---------+---------------+---------+-----------+----------+--------------+ PERO                                                  Not visualized +---------+---------------+---------+-----------+----------+--------------+   +---------+---------------+---------+-----------+----------+--------------+ LEFT     CompressibilityPhasicitySpontaneityPropertiesThrombus Aging +---------+---------------+---------+-----------+----------+--------------+ CFV      Full           Yes      Yes                                 +---------+---------------+---------+-----------+----------+--------------+ SFJ      Full                                                         +---------+---------------+---------+-----------+----------+--------------+ FV Prox  Full                                                        +---------+---------------+---------+-----------+----------+--------------+ FV Mid   Full                                                        +---------+---------------+---------+-----------+----------+--------------+  FV DistalFull                                                        +---------+---------------+---------+-----------+----------+--------------+ PFV      Full                                                        +---------+---------------+---------+-----------+----------+--------------+ POP      Full           Yes      Yes                                 +---------+---------------+---------+-----------+----------+--------------+ PTV      Full                                                        +---------+---------------+---------+-----------+----------+--------------+ PERO     Full                                                        +---------+---------------+---------+-----------+----------+--------------+     Summary: RIGHT: - There is no evidence of deep vein thrombosis in the lower extremity. However, portions of this examination were limited- see technologist comments above.  - No cystic structure found in the popliteal fossa.  LEFT: - There is no evidence of deep vein thrombosis in the lower extremity.  - No cystic structure found in the popliteal fossa.  *See table(s) above for measurements and observations. Electronically signed by Coral Else MD on 01/10/2023 at 5:47:14 PM.    Final    CT Angio Chest PE W and/or Wo Contrast  Result Date: 01/10/2023 CLINICAL DATA:  Suspected pulmonary embolism. EXAM: CT ANGIOGRAPHY CHEST WITH CONTRAST TECHNIQUE: Multidetector CT imaging of the chest was performed using the standard protocol during bolus administration of intravenous contrast. Multiplanar CT  image reconstructions and MIPs were obtained to evaluate the vascular anatomy. RADIATION DOSE REDUCTION: This exam was performed according to the departmental dose-optimization program which includes automated exposure control, adjustment of the mA and/or kV according to patient size and/or use of iterative reconstruction technique. CONTRAST:  75mL OMNIPAQUE IOHEXOL 350 MG/ML SOLN COMPARISON:  May 03, 2011 FINDINGS: Cardiovascular: Thoracic aorta is normal in appearance. Satisfactory opacification of the pulmonary arteries to the segmental level. No evidence of pulmonary embolism. Normal heart size. No pericardial effusion. Mediastinum/Nodes: No enlarged mediastinal, hilar, or axillary lymph nodes. Thyroid gland, trachea, and esophagus demonstrate no significant findings. Lungs/Pleura: Lungs are clear. No pleural effusion or pneumothorax. Upper Abdomen: Surgical sutures are seen throughout the gastric region. Multiple surgical clips are seen within the gallbladder fossa. A 2 mm nonobstructing renal calculus is seen within the upper pole of the left kidney. Musculoskeletal: No chest wall abnormality. No acute or significant osseous findings.  Review of the MIP images confirms the above findings. IMPRESSION: 1. No evidence of pulmonary embolism or acute cardiopulmonary disease. 2. Evidence of prior gastric surgery and prior cholecystectomy. 3. 2 mm nonobstructing left renal calculus. Electronically Signed   By: Aram Candela M.D.   On: 01/10/2023 01:56   DG Chest 2 View  Result Date: 01/09/2023 CLINICAL DATA:  Chest pain EXAM: CHEST - 2 VIEW COMPARISON:  None Available. FINDINGS: Lungs are clear. No pneumothorax or pleural effusion. Cardiac size within normal limits. Pulmonary vascularity is normal. Neurostimulator battery pack overlies the left hemithorax with its single lead overlying the left neck base. No acute bone abnormality. IMPRESSION: 1. No active cardiopulmonary disease. Electronically Signed    By: Helyn Numbers M.D.   On: 01/09/2023 20:25    Medications: I have reviewed the patient's current medications. Scheduled Meds:  amitriptyline  25 mg Oral QHS   carvedilol  12.5 mg Oral BID WC   famotidine  20 mg Oral BID   gabapentin  600 mg Oral TID   isosorbide mononitrate  90 mg Oral BID   levothyroxine  50 mcg Oral Daily   Milnacipran HCl  12.5 mg Oral BID   pantoprazole  40 mg Oral QAC breakfast   rivaroxaban  10 mg Oral Daily   spironolactone  25 mg Oral Daily   topiramate  200 mg Oral BID   Continuous Infusions: PRN Meds:.acetaminophen, guaiFENesin, ipratropium-albuterol, methocarbamol, senna-docusate, traZODone  Assessment/Plan: Principal Problem:   Shortness of breath Active Problems:   HTN, goal below 130/80   Dyslipidemia, goal LDL below 70   GERD (gastroesophageal reflux disease)   Chronic diastolic heart failure (HCC)   Chest pain   Depression   Fibromyalgia   SOB (shortness of breath)  [name] is a 57 y.o. 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 Ischemic evaluation has been unremarkable. No evidence of DVT or PE on imaging. Less likely cardiac or pulmonary source for his symptoms. She appears euvolemic on exam. Obtain ECHO to evaluate valvular disease. AM Cortisol level low at 1.4. Suspect possible adrenal insufficiency.    -Tele -ACTH stimulation test  -IV Cosyntropin injection  -ECHO  -Follow-up vitamin B12 -PFTs as outpatient -Duonebs PRN   #Vit D Deficiency  Vit D low at 21.56   -Vit D3 replacement 1000u   #NAGMA Bicarb 20 with no AG. Denies diarrhea. May be 2/2 use of  spironolactone, history gastric bypass, or adrenal insufficiency.   -CMP    #Left renal calculus  CT angio noted a 2mm nonobstructing left renal calculus. No reported urinary symptoms  -Continue to monitor, outpatient fu     Chronic Problems:  #HFpEF Last TTE in March 2022 showed EF 70-75%. Home medications include carvedilol 12.5 mg BID and spironolactone 25 mg per prior notes. Lasix 20 mg PRN.  -Continue home meds   #Hx of coronary vasospasm. LHC in 2023 at [facility] Med showed no CAD.   -Cont Imdur 90 mg BID    #Fibromyalgia: Cont home amitriptyline 25 mg  #Hypothyroidism: Cont home synthroid 50 mcg every day. TSH nl   #GERD  H/o esophageal dilatation: No reported dysphagia to foods or liquids.  Cont home Pepcid. Started Protonix 40mg  daily     Diet: Normal VTE: Xarelto  Code: Full  This is a Psychologist, occupational Note.  The care of the patient was discussed with Dr. Mikey Bussing and the assessment and plan formulated with their assistance.  Please see their attached note for official documentation of the daily encounter.   LOS: 0 days   Loyal Buba 01/11/2023, 11:32 AM

## 2023-01-11 NOTE — Hospital Course (Addendum)
[name] is a 57 y.o. 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  Chronic Adrenal insuffiencey  Hypocortisolism  Presented with 1 week of DOE with associated fatigue, chest tightness, and lower extremity swelling. Ischemic evaluation was been unremarkable. ECHO showed EF 55-60% slightly reduced from her prior ECHO in March 2022 along with moderate LVH. There was no evidence of DVT or PE on imaging. She was euvolemic and hemodynamically stable. There was low suspicion for cardiac or pulmonary cause. An AM cortisol level was obtained that showed hypocortisolism. A follow-up ACTH stimulation test was performed that confirmed adrenal insuffiencey. She was discharged with hydrocortisone 5mg  to take twice daily in the morning (total 10mg ) followed by once daily (total 5mg ) in the afternoon.      #Vit D Deficiency  Vit D low at 21.56. Discharged with Vit D3 1000u for 15 days    #NAGMA Bicarb 20 with no AG. Denies diarrhea. May be 2/2 use of spironolactone, history gastric bypass, or adrenal insufficiency.     #Left renal calculus, asymptomatic   CT angio noted a 2mm nonobstructing left renal calculus. No reported urinary symptoms. Monitor outpatient    Chronic Problems:   #HFpEF Last TTE in March 2022 showed EF 70-75%. TTE on during admission showed EF now at 55-60% with some pericardial effusion and moderated LVH. Continued home medications carvedilol 12.5 mg BID and spironolactone 25 mg per prior notes.     #Hx of coronary vasospasm. LHC in 2023 at [facility] Med showed no CAD. Continued home Imdur 90 mg BID     #Fibromyalgia: Cont home amitriptyline 25 mg   #Hypothyroidism: Cont home synthroid 50 mcg every day. TSH nl    #GERD  H/o esophageal dilatation: No reported dysphagia to foods or  liquids. Cont home Pepcid and was started on Protonix 40mg  daily zx

## 2023-01-11 NOTE — Discharge Summary (Addendum)
Name: [name] MRN: [id number] DOB: July 16, 1965 57 y.o. PCP: Rudene Christians, DO  Date of Admission: 01/09/2023  7:44 PM Date of Discharge: 01/12/2023 Attending Physician: Gust Rung, DO  Discharge Diagnosis: 1. Principal Problem:   Shortness of breath Active Problems:   HTN, goal below 130/80   Dyslipidemia, goal LDL below 70   GERD (gastroesophageal reflux disease)   Chronic diastolic heart failure (HCC)   Chest pain   Depression   Fibromyalgia   Morbid obesity (HCC)- BMI >35 + HTN and HLD   SOB (shortness of breath)   Adrenal insufficiency (HCC)  Discharge Medications: Allergies as of 01/12/2023       Reactions   Amitriptyline Swelling   Amlodipine Swelling   Latex Hives, Itching, Swelling, Rash   Burning also   Ranexa [ranolazine] Swelling   Pt reports upper/lower extremity edema and sob while taking this medication.   Amantadines Swelling   Aspirin Nausea Only   Other reaction(s): Other (See Comments) Irritates stomach also/hx of stomach ulcers   Simvastatin Swelling   Other reaction(s): Other (See Comments) pain Muscle pains also   Clindamycin Other (See Comments)   'peels skin off"   Meloxicam Itching   Tape Rash   Pt able to tolerate paper tape         Medication List     TAKE these medications    acetaminophen 325 MG tablet Commonly known as: TYLENOL Take 650 mg by mouth every 6 (six) hours as needed for headache.   albuterol 108 (90 Base) MCG/ACT inhaler Commonly known as: VENTOLIN HFA Inhale 2 puffs into the lungs every 6 (six) hours as needed for wheezing or shortness of breath.   amitriptyline 25 MG tablet Commonly known as: ELAVIL Take 25 mg by mouth at bedtime. For pain   carvedilol 25 MG tablet Commonly known as: COREG TAKE 1.5 TABLETS (37.5 MG TOTAL) BY MOUTH TWICE A DAY   Emgality 120 MG/ML Soaj Generic drug: Galcanezumab-gnlm Inject 1 Application into the skin every 30 (thirty) days.   famotidine 20 MG  tablet Commonly known as: PEPCID Take 20 mg by mouth 2 (two) times daily.   furosemide 20 MG tablet Commonly known as: LASIX Take 1 tablet (20 mg total) by mouth daily as needed for edema.   gabapentin 600 MG tablet Commonly known as: Neurontin Take 1 tablet (600 mg total) by mouth 3 (three) times daily.   hydrocortisone 5 MG tablet Commonly known as: CORTEF Take 2 tablets (10 mg total) by mouth daily AND 1 tablet (5 mg total) daily.   isosorbide mononitrate 30 MG 24 hr tablet Commonly known as: IMDUR Take 3 tablets (90 mg total) by mouth 2 (two) times daily.   levothyroxine 50 MCG tablet Commonly known as: SYNTHROID TAKE 1 TABLET BY MOUTH EVERY DAY   methocarbamol 750 MG tablet Commonly known as: ROBAXIN Take 750 mg by mouth every 8 (eight) hours as needed for muscle spasms.   nitroGLYCERIN 0.4 MG SL tablet Commonly known as: NITROSTAT PLACE 1 TAB UNDER TONGUE EVERY 5 MINUTES AS NEEDED FOR CHEST PAIN, MAX 3/15 MINS What changed: Another medication with the same name was removed. Continue taking this medication, and follow the directions you see here.   Savella 12.5 MG Tabs Generic drug: Milnacipran HCl Take 12.5 mg by mouth in the morning and at bedtime.   spironolactone 25 MG tablet Commonly known as: ALDACTONE Take 25 mg by mouth daily.   topiramate 200 MG tablet Commonly known  as: Topamax Take 1 tablet (200 mg total) by mouth 2 (two) times daily.   Ubrelvy 50 MG Tabs Generic drug: Ubrogepant Take 1 tab at onset of migraine, may repeat in 2 hours. Max dose 2 tabs/ day   vitamin D3 25 MCG tablet Commonly known as: CHOLECALCIFEROL Take 1 tablet (1,000 Units total) by mouth daily. Start taking on: January 13, 2023        Disposition and follow-up:   [name] was discharged from [facility] in Stable condition.  At the hospital follow up visit please address:  1.  [ ]  PCP: Discharged with Vitamin D3 and hydrocortisone 5mg  to take  twice daily in the morning and once daily in the afternoon for adrenal insufficiency. Plan to continue steroid for at least 3 months. Will need endocrinology follow-up. May consider obtaining PFTs    2. [ ]  PCP: CT angio noted a 2mm nonobstructing left renal calculus. No reported urinary symptoms. Continue to monitor     Hospital Course by problem list:  [name] is a 57 y.o. 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  Chronic Adrenal insuffiencey  Hypocortisolism  Presented with 1 week of DOE with associated fatigue, chest tightness, and lower extremity swelling. Ischemic evaluation was been unremarkable. ECHO showed EF 55-60% slightly reduced from her prior ECHO in March 2022 along with moderate LVH. There was no evidence of DVT or PE on imaging. She was euvolemic and hemodynamically stable. There was low suspicion for cardiac or pulmonary cause. An AM cortisol level was obtained that showed hypocortisolism. A follow-up ACTH stimulation test was performed that confirmed adrenal insuffiencey. She was discharged with hydrocortisone 5mg  to take twice daily in the morning (total 10mg ) followed by once daily (total 5mg ) in the afternoon.      #Vit D Deficiency  Vit D low at 21.56. Discharged with Vit D3 1000u for 15 days    #NAGMA Bicarb 20 with no AG. Denies diarrhea. May be 2/2 use of spironolactone, history gastric bypass, or adrenal insufficiency.     #Left renal calculus, asymptomatic   CT angio noted a 2mm nonobstructing left renal calculus. No reported urinary symptoms. Monitor outpatient    Chronic Problems:   #HFpEF Last TTE in March 2022 showed EF 70-75%. TTE on during admission showed EF now at 55-60% with some pericardial effusion and moderated LVH. Continued home  medications carvedilol 12.5 mg BID and spironolactone 25 mg per prior notes.     #Hx of coronary vasospasm. LHC in 2023 at [facility] Med showed no CAD. Continued home Imdur 90 mg BID     #Fibromyalgia: Cont home amitriptyline 25 mg   #Hypothyroidism: Cont home synthroid 50 mcg every day. TSH nl    #GERD  H/o esophageal dilatation: No reported dysphagia to foods or liquids. Cont home Pepcid and was started on Protonix 40mg  daily zx   Discharge Exam:   BP 115/77 (BP Location: Right Arm)   Pulse 81   Temp 97.6 F (36.4 C) (Oral)   Resp 18   Ht 5\' 5"  (1.651 m)   Wt 100.7 kg   LMP 04/13/2018   SpO2 99%   BMI 36.94 kg/m  General appearance: alert, cooperative, and no distress Head: Normocephalic, without obvious abnormality, atraumatic Lungs: clear to auscultation bilaterally Heart: regular rate and rhythm,  S1, S2 normal, no murmur, click, rub or gallop Extremities: extremities normal, atraumatic, no cyanosis or edema Skin: Skin color, texture, turgor normal. No rashes or lesions  Pertinent Labs, Studies, and Procedures:   Recent Results (from the past 240 hour(s))  SARS Coronavirus 2 by RT PCR (hospital order, performed in [facility] hospital lab) *cepheid single result test* Anterior Nasal Swab     Status: None   Collection Time: 01/09/23 11:09 PM   Specimen: Anterior Nasal Swab  Result Value Ref Range Status   SARS Coronavirus 2 by RT PCR NEGATIVE NEGATIVE Final    Comment: Performed at [facility], [address] [place], [place] [address]    Lab Results  Component Value Date   WBC 5.7 01/12/2023   HGB 11.7 (L) 01/12/2023   HCT 37.9 01/12/2023   PLT 186 01/12/2023   Lab Results  Component Value Date   NA 141 01/12/2023   K 3.7 01/12/2023   CL 111 01/12/2023   CO2 18 (L) 01/12/2023   BUN 19 01/12/2023   CREATININE 0.83 01/12/2023   CALCIUM 8.6 (L) 01/12/2023   MG 2.3 01/12/2023   PHOS 2.8 01/11/2023   Lab Results  Component Value Date   ALKPHOS 116  01/12/2023   BILITOT 0.3 01/12/2023   BILIDIR 0.2 07/06/2013   PROT 6.3 (L) 01/12/2023   ALBUMIN 3.3 (L) 01/12/2023   ALT 35 01/12/2023   AST 21 01/12/2023   GGT 10 05/24/2022   Lab Results  Component Value Date   INR 1.05 06/01/2016    ECHOCARDIOGRAM Complete  Result date: 01/11/2023  FINDINGS   Left Ventricle: Left ventricular ejection fraction, by estimation, is 55  to 60%. Left ventricular ejection fraction by 2D MOD biplane is 64.0 %.  The left ventricle has normal function. The left ventricle has no regional  wall motion abnormalities.  Definity contrast agent was given IV to delineate the left ventricular  endocardial borders. The left ventricular internal cavity size was normal  in size. There is moderate left ventricular hypertrophy. Left ventricular  diastolic parameters are consistent   with Grade I diastolic dysfunction (impaired relaxation).   Right Ventricle: The right ventricular size is normal. No increase in  right ventricular wall thickness. Right ventricular systolic function is  normal. Tricuspid regurgitation signal is inadequate for assessing PA  pressure.   Left Atrium: Left atrial size was normal in size.   Right Atrium: Right atrial size was normal in size.   Pericardium: Trivial pericardial effusion is present.   Mitral Valve: The mitral valve is normal in structure. Trivial mitral  valve regurgitation. No evidence of mitral valve stenosis. MV peak  gradient, 3.9 mmHg. The mean mitral valve gradient is 2.0 mmHg.   Tricuspid Valve: The tricuspid valve is normal in structure. Tricuspid  valve regurgitation is trivial. No evidence of tricuspid stenosis.   Aortic Valve: The aortic valve is tricuspid. Aortic valve regurgitation is  mild. No aortic stenosis is present.   Pulmonic Valve: The pulmonic valve was normal in structure. Pulmonic valve  regurgitation is trivial. No evidence of pulmonic stenosis.   Aorta: The aortic root is normal in size  and structure.   Venous: The inferior vena cava is normal in size with greater than 50%  respiratory variability, suggesting right atrial pressure of 3 mmHg.   IAS/Shunts: No atrial level shunt detected by color flow Doppler.     LEFT VENTRICLE  PLAX 2D  Biplane EF (MOD)  LVIDd:         4.00 cm         LV Biplane EF:   Left  LVIDs:         2.60 cm                          ventricular  LV PW:         1.30 cm                          ejection  LV IVS:        1.30 cm                          fraction by  LVOT diam:     2.00 cm                          2D MOD  LV SV:         84                               biplane is  LV SV Index:   41                               64.0 %.  LVOT Area:     3.14 cm                                 Diastology                                 LV e' medial:    5.44 cm/s  LV Volumes (MOD)               LV E/e' medial:  10.8  LV vol d, MOD    131.0 ml      LV e' lateral:   6.64 cm/s  A2C:                           LV E/e' lateral: 8.9  LV vol d, MOD    117.0 ml  A4C:  LV vol s, MOD    48.5 ml  A2C:  LV vol s, MOD    41.4 ml  A4C:  LV SV MOD A2C:   82.5 ml  LV SV MOD A4C:   117.0 ml  LV SV MOD BP:    79.9 ml   RIGHT VENTRICLE             IVC  RV Basal diam:  3.20 cm     IVC diam: 1.90 cm  RV S prime:     11.40 cm/s  TAPSE (M-mode): 2.2 cm   LEFT ATRIUM             Index        RIGHT ATRIUM          Index  LA diam:        3.90 cm 1.89 cm/m   RA Area:     9.38 cm  LA Vol (A2C):  48.3 ml 23.36 ml/m  RA Volume:   18.40 ml 8.90 ml/m  LA Vol (A4C):   46.2 ml 22.34 ml/m  LA Biplane Vol: 48.8 ml 23.60 ml/m   AORTIC VALVE  LVOT Vmax:   128.00 cm/s  LVOT Vmean:  96.400 cm/s  LVOT VTI:    0.267 m    AORTA  Ao Root diam: 2.50 cm  Ao Asc diam:  2.90 cm   MITRAL VALVE  MV Area (PHT): 3.12 cm    SHUNTS  MV Area VTI:   4.07 cm    Systemic VTI:  0.27 m  MV Peak grad:  3.9 mmHg    Systemic Diam: 2.00 cm  MV Mean grad:  2.0  mmHg  MV Vmax:       0.99 m/s  MV Vmean:      63.3 cm/s  MV Decel Time: 243 msec  MR Peak grad: 23.9 mmHg  MR Vmax:      244.50 cm/s  MV E velocity: 58.80 cm/s  MV A velocity: 66.50 cm/s  MV E/A ratio:  0.88   Abigail Brass MD  Electronically signed by Abigail Brass MD  Signature Date/Time: 01/11/2023/10:40:32 AM    VAS [place] LOWER EXTREMITY VENOUS (DVT)  Result Date: 01/10/2023  Lower Venous DVT Study Patient Name:  [name]  Date of Exam:   01/10/2023 Medical Rec #: [id number]       Accession #:    [id number] Date of Birth: April 03, 1966       Patient Gender: F Patient Age:   59 years Exam Location:  [facility] Procedure:      VAS [place] LOWER EXTREMITY VENOUS (DVT) Referring Phys: [name] --------------------------------------------------------------------------------  Indications: Pain, Swelling, SOB, and CHF.  Limitations: Body habitus and poor ultrasound/tissue interface. Comparison Study: Previous LLE 09/08/21 negative.                   No previous RLE. Performing Technologist: McKayla Maag RVT, [place]  Examination Guidelines: A complete evaluation includes B-mode imaging, spectral Doppler, color Doppler, and power Doppler as needed of all accessible portions of each vessel. Bilateral testing is considered an integral part of a complete examination. Limited examinations for reoccurring indications may be performed as noted. The reflux portion of the exam is performed with the patient in reverse Trendelenburg.  +---------+---------------+---------+-----------+----------+--------------+ RIGHT    CompressibilityPhasicitySpontaneityPropertiesThrombus Aging +---------+---------------+---------+-----------+----------+--------------+ CFV      Full           Yes      Yes                                 +---------+---------------+---------+-----------+----------+--------------+ SFJ      Full                                                         +---------+---------------+---------+-----------+----------+--------------+ FV Prox  Full                                                        +---------+---------------+---------+-----------+----------+--------------+ FV Mid   Full                                                        +---------+---------------+---------+-----------+----------+--------------+  FV DistalFull                                                        +---------+---------------+---------+-----------+----------+--------------+ PFV      Full                                                        +---------+---------------+---------+-----------+----------+--------------+ POP      Full           Yes      Yes                                 +---------+---------------+---------+-----------+----------+--------------+ PTV      Full                                                        +---------+---------------+---------+-----------+----------+--------------+ PERO                                                  Not visualized +---------+---------------+---------+-----------+----------+--------------+   +---------+---------------+---------+-----------+----------+--------------+ LEFT     CompressibilityPhasicitySpontaneityPropertiesThrombus Aging +---------+---------------+---------+-----------+----------+--------------+ CFV      Full           Yes      Yes                                 +---------+---------------+---------+-----------+----------+--------------+ SFJ      Full                                                        +---------+---------------+---------+-----------+----------+--------------+ FV Prox  Full                                                        +---------+---------------+---------+-----------+----------+--------------+ FV Mid   Full                                                         +---------+---------------+---------+-----------+----------+--------------+ FV DistalFull                                                        +---------+---------------+---------+-----------+----------+--------------+  PFV      Full                                                        +---------+---------------+---------+-----------+----------+--------------+ POP      Full           Yes      Yes                                 +---------+---------------+---------+-----------+----------+--------------+ PTV      Full                                                        +---------+---------------+---------+-----------+----------+--------------+ PERO     Full                                                        +---------+---------------+---------+-----------+----------+--------------+     Summary: RIGHT: - There is no evidence of deep vein thrombosis in the lower extremity. However, portions of this examination were limited- see technologist comments above.  - No cystic structure found in the popliteal fossa.  LEFT: - There is no evidence of deep vein thrombosis in the lower extremity.  - No cystic structure found in the popliteal fossa.  *See table(s) above for measurements and observations. Electronically signed by Abigail Else MD on 01/10/2023 at 5:47:14 PM.    Final    CT Angio Chest PE W and/or Wo Contrast  Result Date: 01/10/2023 CLINICAL DATA:  Suspected pulmonary embolism. EXAM: CT ANGIOGRAPHY CHEST WITH CONTRAST TECHNIQUE: Multidetector CT imaging of the chest was performed using the standard protocol during bolus administration of intravenous contrast. Multiplanar CT image reconstructions and MIPs were obtained to evaluate the vascular anatomy. RADIATION DOSE REDUCTION: This exam was performed according to the departmental dose-optimization program which includes automated exposure control, adjustment of the mA and/or kV according to patient size and/or use of  iterative reconstruction technique. CONTRAST:  75mL OMNIPAQUE IOHEXOL 350 MG/ML SOLN COMPARISON:  May 03, 2011 FINDINGS: Cardiovascular: Thoracic aorta is normal in appearance. Satisfactory opacification of the pulmonary arteries to the segmental level. No evidence of pulmonary embolism. Normal heart size. No pericardial effusion. Mediastinum/Nodes: No enlarged mediastinal, hilar, or axillary lymph nodes. Thyroid gland, trachea, and esophagus demonstrate no significant findings. Lungs/Pleura: Lungs are clear. No pleural effusion or pneumothorax. Upper Abdomen: Surgical sutures are seen throughout the gastric region. Multiple surgical clips are seen within the gallbladder fossa. A 2 mm nonobstructing renal calculus is seen within the upper pole of the left kidney. Musculoskeletal: No chest wall abnormality. No acute or significant osseous findings. Review of the MIP images confirms the above findings. IMPRESSION: 1. No evidence of pulmonary embolism or acute cardiopulmonary disease. 2. Evidence of prior gastric surgery and prior cholecystectomy. 3. 2 mm nonobstructing left renal calculus. Electronically Signed   By: [name] M.D.   On: 01/10/2023 01:56   DG Chest 2 View  Result  Date: 01/09/2023 CLINICAL DATA:  Chest pain EXAM: CHEST - 2 VIEW COMPARISON:  None Available. FINDINGS: Lungs are clear. No pneumothorax or pleural effusion. Cardiac size within normal limits. Pulmonary vascularity is normal. Neurostimulator battery pack overlies the left hemithorax with its single lead overlying the left neck base. No acute bone abnormality. IMPRESSION: 1. No active cardiopulmonary disease. Electronically Signed   By: [name] M.D.   On: 01/09/2023 20:25   Discharge Instructions: Discharge Instructions     Diet - low sodium heart healthy   Complete by: As directed    Face-to-face encounter (required for Medicare/Medicaid patients)   Complete by: As directed    I [name] certify that this  patient is under my care and that I, or a nurse practitioner or physician's assistant working with me, had a face-to-face encounter that meets the physician face-to-face encounter requirements with this patient on 01/12/2023. The encounter with the patient was in whole, or in part for the following medical condition(s) which is the primary reason for home health care (List medical condition): Shortness of breath with activity   The encounter with the patient was in whole, or in part, for the following medical condition, which is the primary reason for home health care: Weakness   I certify that, based on my findings, the following services are medically necessary home health services: Physical therapy   Reason for Medically Necessary Home Health Services: Therapy- Investment banker, operational, Patent examiner   My clinical findings support the need for the above services: Shortness of breath with activity   Further, I certify that my clinical findings support that this patient is homebound due to: Shortness of Breath with activity   Home Health   Complete by: As directed    To provide the following care/treatments: PT   Increase activity slowly   Complete by: As directed       Hi Mrs. Hockin,   You were to admitted to [facility] for shortness of breath and fatigue. You were found to have low cortisol levels and will be given a medication to increase your cortisol to help with your symptoms    You will be discharged with the following medications: -Hydrocortisone 5mg   -Vitamin D3   Plan to take 2 tablets (10 mg total) of hydrocortisone by mouth daily in the morning followed 1 tablet (5 mg total) hydrocortisone daily in the afternoon.    Side effects from taking hydrocortisone that you may experience may include increase appetite, nausea, headaches, skin changes, and an elevated blood pressure    Please attend your scheduled follow-up appointments: -You have a scheduled appointment with  the [facility] on August 27th at 9:15am with Dr. Gwenevere Abbot  If you have any questions or concerns, call our clinic at [phone] or after hours call [phone] and ask for the internal medicine resident on call.  Signed: Lona Richardson, Medical Student 01/12/2023, 11:34 AM     Attestation for Student Documentation:  I personally was present and re-performed the history, physical exam and medical decision-making activities of this service and have verified that the service and findings are accurately documented in the student's note.  Abigail Messier, MD 01/12/2023, 11:35 AM

## 2023-01-11 NOTE — Care Management Obs Status (Signed)
MEDICARE OBSERVATION STATUS NOTIFICATION   Patient Details  Name: [name] MRN: [id number] Date of Birth: Mar 16, 1966   Medicare Observation Status Notification Given:  Yes    Tom-Johnson, Hershal Coria, RN 01/11/2023, 1:42 PM

## 2023-01-11 NOTE — Progress Notes (Signed)
  Echocardiogram 2D Echocardiogram has been performed.  [name] 01/11/2023, 9:21 AM

## 2023-01-11 NOTE — TOC Initial Note (Signed)
Transition of Care [facility]) - Initial/Assessment Note    Patient Details  Name: [name] MRN: [id number] Date of Birth: [birth year]  Transition of Care [facility]) CM/SW Contact:    Tom-Johnson, Hershal Coria, RN Phone Number: 01/11/2023, 2:21 PM  Clinical Narrative:                  CM spoke with patient at bedside about needs for post hospital transition.  Presented to the ED with Chest Pain, BLE swelling, SOB and Cough.  Patient has hx of CHF and Seizure.  Currently on Room Air.   Admitted for further Medical workup.  From home with Autistic daughter, has four children. Patient is not employed, on disability. Does not drive, family drives to and from appointments at times and patient uses [employer] at other times.  Has a cane and walker, cane is at bedside.  PCP is with [facility], could not remember name. Uses [facility] on [address] in [place].  CM spoke with patient about Home Health recommendations, patient has no preference. CM called in referral to [facility] and Marylene Land voiced acceptance, info on AVS.        CM will continue to follow as patient progresses with care towards discharge.    Expected Discharge Plan: Home w Home Health Services Barriers to Discharge: Continued Medical Work up   Patient Goals and CMS Choice Patient states their goals for this hospitalization and ongoing recovery are:: To return home CMS Medicare.gov Compare Post Acute Care list provided to:: Patient Choice offered to / list presented to : Patient      Expected Discharge Plan and Services   Discharge Planning Services: CM Consult Post Acute Care Choice: Home Health Living arrangements for the past 2 months: Apartment                 DME Arranged: N/A DME Agency: NA       HH Arranged: PT, OT HH Agency: [facility] Date HH Agency Contacted: 01/11/23 Time HH Agency Contacted: 1346 Representative spoke with at [facility] Agency: Tresa Endo  Prior Living  Arrangements/Services Living arrangements for the past 2 months: Apartment Lives with:: Adult Children (Daughter) Patient language and need for interpreter reviewed:: Yes Do you feel safe going back to the place where you live?: Yes      Need for Family Participation in Patient Care: Yes (Comment) Care giver support system in place?: Yes (comment) Current home services: DME (Cane, walker) Criminal Activity/Legal Involvement Pertinent to Current Situation/Hospitalization: No - Comment as needed  Activities of Daily Living Home Assistive Devices/Equipment: Eyeglasses, Environmental consultant (specify type), Cane (specify quad or straight) ADL Screening (condition at time of admission) Patient's cognitive ability adequate to safely complete daily activities?: Yes Is the patient deaf or have difficulty hearing?: No Does the patient have difficulty seeing, even when wearing glasses/contacts?: No Does the patient have difficulty concentrating, remembering, or making decisions?: No Patient able to express need for assistance with ADLs?: Yes Does the patient have difficulty dressing or bathing?: No Independently performs ADLs?: Yes (appropriate for developmental age) Does the patient have difficulty walking or climbing stairs?: Yes Weakness of Legs: Both Weakness of Arms/Hands: Both (weakness in both hands and left arm)  Permission Sought/Granted Permission sought to share information with : Case Manager, Magazine features editor, Family Supports Permission granted to share information with : Yes, Verbal Permission Granted              Emotional Assessment Appearance:: Appears stated  age Attitude/Demeanor/Rapport: Engaged, Gracious Affect (typically observed): Accepting, Appropriate, Hopeful, Pleasant Orientation: : Oriented to Self, Oriented to Place, Oriented to  Time, Oriented to Situation Alcohol / Substance Use: Not Applicable Psych Involvement: No (comment)  Admission diagnosis:  SOB  (shortness of breath) [R06.02] Patient Active Problem List   Diagnosis Date Noted   Shortness of breath 01/10/2023   SOB (shortness of breath) 01/10/2023   Chronic idiopathic constipation 04/11/2022   Epigastric pain 04/11/2022   Flatulence, eructation and gas pain 04/11/2022   Morbid (severe) obesity due to excess calories (HCC) 04/11/2022   Coronary vasospasm (HCC) 08/24/2021   Left leg swelling 08/24/2021   Aortic atherosclerosis (HCC) 04/08/2021   Menopausal and perimenopausal disorder 04/08/2021   Vitamin D deficiency 04/08/2021   Abnormal serum level of alkaline phosphatase 04/08/2021   Healthcare maintenance 04/08/2021   Status post VNS (vagus nerve stimulator) placement 09/28/2020   Chronic migraine w/o aura w/o status migrainosus, not intractable 04/15/2020   Cervical myelopathy with cervical radiculopathy (HCC) 01/09/2020   Fibromyalgia 11/26/2019   Bilateral carpal tunnel syndrome 07/07/2019   Migraines 07/07/2019   ASCUS of cervix with negative high risk HPV 12/25/2018   Osteoarthritis 11/24/2018   Depression 05/14/2018   Lumbar back pain with radiculopathy affecting right lower extremity 03/05/2018   Status post biliopancreatic diversion with duodenal switch 11/14/2017   OSA (obstructive sleep apnea) 07/26/2017   Hydradenitis 11/09/2016   Chest pain 08/04/2016   Asthma 08/24/2014   Chronic diastolic heart failure (HCC) 06/19/2014   Allergic rhinitis 05/04/2014   Chronic chest pain    GERD (gastroesophageal reflux disease) 02/10/2013   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, intractable (HCC) 12/29/2011   Dyslipidemia, goal LDL below 70 05/17/2011   HTN, goal below 130/80 04/24/2011   PCP:  [facility]   [phone], [place] - [address] [place] [address] Phone: [phone] Fax: [phone]     Social Determinants of Health (SDOH) Social History: SDOH  Screenings   Food Insecurity: No Food Insecurity (01/10/2023)  Housing: Low Risk  (01/10/2023)  Transportation Needs: No Transportation Needs (01/10/2023)  Utilities: Not At Risk (01/10/2023)  Alcohol Screen: Low Risk  (02/13/2022)  Depression (PHQ2-9): Low Risk  (11/07/2022)  Financial Resource Strain: Low Risk  (02/13/2022)  Physical Activity: Insufficiently Active (02/13/2022)  Social Connections: Moderately Integrated (05/24/2022)  Stress: No Stress Concern Present (02/13/2022)  Tobacco Use: Low Risk  (01/10/2023)   SDOH Interventions: Transportation Interventions: Intervention Not Indicated, Inpatient TOC, Patient Resources (Friends/Family)   Readmission Risk Interventions     No data to display

## 2023-01-11 NOTE — Plan of Care (Signed)

## 2023-01-12 ENCOUNTER — Other Ambulatory Visit (HOSPITAL_COMMUNITY): Payer: Self-pay

## 2023-01-12 DIAGNOSIS — R0602 Shortness of breath: Secondary | ICD-10-CM | POA: Diagnosis not present

## 2023-01-12 MED ORDER — HYDROCORTISONE 5 MG PO TABS
5.0000 mg | ORAL_TABLET | Freq: Every day | ORAL | Status: DC
  Filled 2023-01-12: qty 1

## 2023-01-12 MED ORDER — VITAMIN D3 25 MCG PO TABS
1000.0000 [IU] | ORAL_TABLET | Freq: Every day | ORAL | 0 refills | Status: DC
Start: 2023-01-13 — End: 2023-06-29
  Filled 2023-01-12: qty 15, 15d supply, fill #0

## 2023-01-12 MED ORDER — HYDROCORTISONE 5 MG PO TABS
ORAL_TABLET | ORAL | 0 refills | Status: DC
  Filled 2023-01-12: qty 90, 30d supply, fill #0

## 2023-01-12 MED ORDER — HYDROCORTISONE 10 MG PO TABS
10.0000 mg | ORAL_TABLET | Freq: Every day | ORAL | Status: DC
  Administered 2023-01-12: 10 mg via ORAL
  Filled 2023-01-12: qty 1

## 2023-01-12 NOTE — Discharge Instructions (Addendum)
Hi Mrs. Zwiebel,   You were to admitted to [facility] for shortness of breath and fatigue. You were found to have low cortisol levels and will be given a medication to increase your cortisol to help with your symptoms   You will be discharged with the following medications: -Hydrocortisone 5mg   -Vitamin D3  Plan to take 2 tablets (10 mg total) of hydrocortisone by mouth daily in the morning followed 1 tablet (5 mg total) hydrocortisone daily in the afternoon.   Side effects from taking hydrocortisone that you may experience may include increase appetite, nausea, headaches, skin changes, and elevated blood pressure    Please attend your scheduled follow-up appointments: -You have a scheduled appointment with the [facility] on August 27th at 9:15am with Dr. Gwenevere Abbot

## 2023-01-12 NOTE — TOC Transition Note (Signed)
Transition of Care [facility]) - CM/SW Discharge Note   Patient Details  Name: [name] MRN: [id number] Date of Birth: 01/04/1966  Transition of Care [facility]) CM/SW Contact:  Tom-Johnson, Hershal Coria, RN Phone Number: 01/12/2023, 12:56 PM   Clinical Narrative:     Patient is scheduled for discharge today.  Home health info, New patient establishment, Hospital f/u and discharge instructions on AVS. Prescriptions sent to [facility] pharmacy and meds will be delivered to patient at bedside prior discharge. Family to transport at discharge.  No further TOC needs noted.       Final next level of care: Home w Home Health Services Barriers to Discharge: Barriers Resolved   Patient Goals and CMS Choice CMS Medicare.gov Compare Post Acute Care list provided to:: Patient Choice offered to / list presented to : Patient  Discharge Placement                  Patient to be transferred to facility by: Family      Discharge Plan and Services Additional resources added to the After Visit Summary for     Discharge Planning Services: CM Consult Post Acute Care Choice: Home Health          DME Arranged: N/A DME Agency: NA       HH Arranged: PT, OT HH Agency: [facility] Date [facility] Agency Contacted: 01/11/23 Time HH Agency Contacted: 1346 Representative spoke with at [facility] Agency: Marylene Land  Social Determinants of Health (SDOH) Interventions SDOH Screenings   Food Insecurity: No Food Insecurity (01/10/2023)  Housing: Low Risk  (01/10/2023)  Transportation Needs: No Transportation Needs (01/10/2023)  Utilities: Not At Risk (01/10/2023)  Alcohol Screen: Low Risk  (02/13/2022)  Depression (PHQ2-9): Low Risk  (11/07/2022)  Financial Resource Strain: Low Risk  (02/13/2022)  Physical Activity: Insufficiently Active (02/13/2022)  Social Connections: Moderately Integrated (05/24/2022)  Stress: No Stress Concern Present (02/13/2022)  Tobacco Use: Low Risk  (01/10/2023)     Readmission Risk  Interventions     No data to display

## 2023-01-12 NOTE — Progress Notes (Signed)
Prior to discharge, the patient was provided written material print-out from [employer] regarding her hydrocortisone oral medication that including clarification of dosing (Hydrocortisone 10mg  in the morning; Hydrocortisone 5mg  in the early evening.) as well as use, dose, dosing interval, and possible side effects.

## 2023-01-15 DIAGNOSIS — G4733 Obstructive sleep apnea (adult) (pediatric): Secondary | ICD-10-CM | POA: Diagnosis not present

## 2023-01-16 DIAGNOSIS — G4733 Obstructive sleep apnea (adult) (pediatric): Secondary | ICD-10-CM | POA: Diagnosis not present

## 2023-01-18 DIAGNOSIS — I10 Essential (primary) hypertension: Secondary | ICD-10-CM | POA: Diagnosis not present

## 2023-01-18 DIAGNOSIS — I700 Atherosclerosis of aorta: Secondary | ICD-10-CM | POA: Diagnosis not present

## 2023-01-18 DIAGNOSIS — I5032 Chronic diastolic (congestive) heart failure: Secondary | ICD-10-CM | POA: Diagnosis not present

## 2023-01-20 ENCOUNTER — Other Ambulatory Visit: Payer: Self-pay | Admitting: Internal Medicine

## 2023-01-20 ENCOUNTER — Other Ambulatory Visit: Payer: Self-pay | Admitting: Cardiovascular Disease

## 2023-01-20 DIAGNOSIS — M79642 Pain in left hand: Secondary | ICD-10-CM

## 2023-01-20 DIAGNOSIS — M25531 Pain in right wrist: Secondary | ICD-10-CM

## 2023-01-22 ENCOUNTER — Telehealth: Payer: Self-pay | Admitting: Internal Medicine

## 2023-01-22 NOTE — Telephone Encounter (Signed)
Return call to Lita PT with [facility] - requesting verbal orders for "PT once a week x 6 weeks for strengthening and cardio-pulmonary". VO given - sending to Yellow team for approval or denial.

## 2023-01-22 NOTE — Telephone Encounter (Signed)
PT from [employer] [facility] therapist requesting a call back to [phone] for VO.

## 2023-01-23 ENCOUNTER — Telehealth: Payer: Self-pay

## 2023-01-23 NOTE — Telephone Encounter (Signed)
pt is on the phone requesting to speak to you [phone]  Mon 3:32 PM Hme Rcom, CMA sure  HR transfer to [phone]  Mon 3:33 PM HR pt called back .Marland Kitchen.. she stated that [facility]

## 2023-01-24 ENCOUNTER — Other Ambulatory Visit: Payer: Self-pay

## 2023-01-24 DIAGNOSIS — I700 Atherosclerosis of aorta: Secondary | ICD-10-CM | POA: Diagnosis not present

## 2023-01-24 DIAGNOSIS — I5032 Chronic diastolic (congestive) heart failure: Secondary | ICD-10-CM | POA: Diagnosis not present

## 2023-01-24 DIAGNOSIS — M7061 Trochanteric bursitis, right hip: Secondary | ICD-10-CM | POA: Diagnosis not present

## 2023-01-24 DIAGNOSIS — I10 Essential (primary) hypertension: Secondary | ICD-10-CM | POA: Diagnosis not present

## 2023-01-24 MED ORDER — CARVEDILOL 25 MG PO TABS: 37.5000 mg | ORAL_TABLET | Freq: Two times a day (BID) | ORAL | 1 refills | Status: DC

## 2023-01-29 DIAGNOSIS — I10 Essential (primary) hypertension: Secondary | ICD-10-CM | POA: Diagnosis not present

## 2023-01-29 DIAGNOSIS — I700 Atherosclerosis of aorta: Secondary | ICD-10-CM | POA: Diagnosis not present

## 2023-01-29 DIAGNOSIS — I5032 Chronic diastolic (congestive) heart failure: Secondary | ICD-10-CM | POA: Diagnosis not present
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
## 2023-01-30 ENCOUNTER — Encounter: Payer: Self-pay | Admitting: Internal Medicine

## 2023-01-30 ENCOUNTER — Other Ambulatory Visit: Payer: Self-pay

## 2023-01-30 ENCOUNTER — Ambulatory Visit (INDEPENDENT_AMBULATORY_CARE_PROVIDER_SITE_OTHER): Payer: 59 | Admitting: Internal Medicine

## 2023-01-30 VITALS — BP 167/94 | HR 79 | Temp 97.9°F | Resp 32 | Ht 65.0 in | Wt 218.5 lb

## 2023-01-30 DIAGNOSIS — E559 Vitamin D deficiency, unspecified: Secondary | ICD-10-CM | POA: Diagnosis not present

## 2023-01-30 DIAGNOSIS — I10 Essential (primary) hypertension: Secondary | ICD-10-CM

## 2023-01-30 DIAGNOSIS — E2740 Unspecified adrenocortical insufficiency: Secondary | ICD-10-CM | POA: Diagnosis not present

## 2023-01-30 DIAGNOSIS — J45909 Unspecified asthma, uncomplicated: Secondary | ICD-10-CM

## 2023-01-30 DIAGNOSIS — Z1239 Encounter for other screening for malignant neoplasm of breast: Secondary | ICD-10-CM

## 2023-01-30 DIAGNOSIS — Z0000 Encounter for general adult medical examination without abnormal findings: Secondary | ICD-10-CM

## 2023-01-30 MED ORDER — IRBESARTAN 150 MG PO TABS
150.0000 mg | ORAL_TABLET | Freq: Every day | ORAL | 0 refills | Status: DC
Start: 2023-01-30 — End: 2023-02-26

## 2023-01-30 MED ORDER — HYDROCORTISONE 5 MG PO TABS
ORAL_TABLET | ORAL | 1 refills | Status: DC
Start: 2023-01-30 — End: 2023-04-02

## 2023-01-30 NOTE — Assessment & Plan Note (Signed)
Uncontrolled here today. Pt not taking all of her BP meds. Her HTN meds include Coreg 37.5 mg BID, Imdur 90 mg BID, Spirinolactone 25 mg daily, irbesartan. She was not taking the irbesartan. Advised to resume this medication as it is a good blood pressure medication and she should be taking it per her cardiologist. Last cardiology note recommended the increase in her Coreg to 37.5 mg BID and to increase her Cleda Daub to 50 mg daily. She is still taking 25 mg of Spironolactone. Some of her fatigue can be from increase in Coreg as it is over the max dose. At next visit, will discuss increasing her Spironolactone and potentially decreasing her Coreg. Follow up in one month.

## 2023-01-30 NOTE — Assessment & Plan Note (Addendum)
Pt notes some improvement with initiation of her hydrocortisone . She states she gets some decrease in energy in the afternoon. Advised to continue taking her hydrocortisone and the referral to endocrinology to discuss best long term treatment for her adrenal insufficiency. It appears she has extended exposure to exogenous steroids including prednisone tapers and joint injections. Referral to endocrinology placed.

## 2023-01-30 NOTE — Assessment & Plan Note (Signed)
Plan to continue supplementation with 1000 units daily and re-check in 3-6 months.

## 2023-01-30 NOTE — Assessment & Plan Note (Signed)
Pt with recent concern of dyspnea on exertion. She has asthma and notes wheezing during these episodes. Her asthma is significant and she has required multiple steroid tapers to control it. These could be a likely precipitant of her adrenal insufficiency. She was prescribed Dulera daily but it appears she is only using albuterol. Will send in dulera to pharmacy with instruction to use daily. Will also place referral to pulmonology as pt has moved to [place] and would like to see someone close to her home town.

## 2023-01-30 NOTE — Assessment & Plan Note (Signed)
Order for mammogram placed.

## 2023-01-30 NOTE — Patient Instructions (Addendum)
[name], it was a pleasure seeing you today! You endorsed feeling well today. Below are some of the things we talked about this visit. We look forward to seeing you in the follow up appointment!  Today we discussed: We will start another inhaler called dulera.  We will refer you to pulmonologist and endocrinologist. Continue taking the prednisone.     I have ordered the following labs today:  Lab Orders  No laboratory test(s) ordered today      Referrals ordered today:   Referral Orders         Ambulatory referral to Pulmonology         Ambulatory referral to Endocrinology       I have ordered the following medication/changed the following medications:   Stop the following medications: There are no discontinued medications.   Start the following medications: No orders of the defined types were placed in this encounter.    Follow-up: 1 month follow up   Please make sure to arrive 15 minutes prior to your next appointment. If you arrive late, you may be asked to reschedule.   We look forward to seeing you next time. Please call our clinic at [phone] if you have any questions or concerns. The best time to call is Monday-Friday from 9am-4pm, but there is someone available 24/7. If after hours or the weekend, call the main hospital number and ask for the Internal Medicine Resident On-Call. If you need medication refills, please notify your pharmacy one week in advance and they will send [place] a request.  Thank you for letting us take part in your care. Wishing you the best!  Thank you, Gwenevere Abbot, MD

## 2023-01-31 DIAGNOSIS — Z8781 Personal history of (healed) traumatic fracture: Secondary | ICD-10-CM | POA: Diagnosis not present

## 2023-01-31 DIAGNOSIS — Z9889 Other specified postprocedural states: Secondary | ICD-10-CM | POA: Diagnosis not present

## 2023-01-31 DIAGNOSIS — Z4889 Encounter for other specified surgical aftercare: Secondary | ICD-10-CM | POA: Diagnosis not present

## 2023-02-06 DIAGNOSIS — M79642 Pain in left hand: Secondary | ICD-10-CM | POA: Diagnosis not present

## 2023-02-06 DIAGNOSIS — Z4789 Encounter for other orthopedic aftercare: Secondary | ICD-10-CM | POA: Diagnosis not present

## 2023-02-06 DIAGNOSIS — M25632 Stiffness of left wrist, not elsewhere classified: Secondary | ICD-10-CM | POA: Diagnosis not present

## 2023-02-06 DIAGNOSIS — M25532 Pain in left wrist: Secondary | ICD-10-CM | POA: Diagnosis not present

## 2023-02-06 DIAGNOSIS — M25642 Stiffness of left hand, not elsewhere classified: Secondary | ICD-10-CM | POA: Diagnosis not present

## 2023-02-07 DIAGNOSIS — I700 Atherosclerosis of aorta: Secondary | ICD-10-CM | POA: Diagnosis not present

## 2023-02-07 DIAGNOSIS — I5032 Chronic diastolic (congestive) heart failure: Secondary | ICD-10-CM | POA: Diagnosis not present

## 2023-02-07 DIAGNOSIS — I10 Essential (primary) hypertension: Secondary | ICD-10-CM | POA: Diagnosis not present

## 2023-02-13 NOTE — Progress Notes (Signed)
Internal Medicine Clinic Attending  Case discussed with the resident at the time of the visit.  We reviewed the resident's history and exam and pertinent patient test results.  I agree with the assessment, diagnosis, and plan of care documented in the resident's note.  

## 2023-02-14 DIAGNOSIS — I700 Atherosclerosis of aorta: Secondary | ICD-10-CM | POA: Diagnosis not present

## 2023-02-14 DIAGNOSIS — I10 Essential (primary) hypertension: Secondary | ICD-10-CM | POA: Diagnosis not present

## 2023-02-14 DIAGNOSIS — I5032 Chronic diastolic (congestive) heart failure: Secondary | ICD-10-CM | POA: Diagnosis not present

## 2023-02-20 DIAGNOSIS — I700 Atherosclerosis of aorta: Secondary | ICD-10-CM | POA: Diagnosis not present

## 2023-02-20 DIAGNOSIS — I5032 Chronic diastolic (congestive) heart failure: Secondary | ICD-10-CM | POA: Diagnosis not present

## 2023-02-20 DIAGNOSIS — I10 Essential (primary) hypertension: Secondary | ICD-10-CM | POA: Diagnosis not present

## 2023-02-22 ENCOUNTER — Ambulatory Visit (INDEPENDENT_AMBULATORY_CARE_PROVIDER_SITE_OTHER): Payer: 59

## 2023-02-22 VITALS — BP 138/92 | HR 86 | Ht 66.0 in | Wt 223.0 lb

## 2023-02-22 DIAGNOSIS — Z0000 Encounter for general adult medical examination without abnormal findings: Secondary | ICD-10-CM
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
## 2023-02-22 NOTE — Patient Instructions (Signed)
[name] , Thank you for taking time to come for your Medicare Wellness Visit. I appreciate your ongoing commitment to your health goals. Please review the following plan we discussed and let me know if I can assist you in the future.   Referrals/Orders/Follow-Ups/Clinician Recommendations: none  This is a list of the screening recommended for you and due dates:  Health Maintenance  Topic Date Due   Zoster (Shingles) Vaccine (1 of 2) Never done   COVID-19 Vaccine (3 - Pfizer risk series) 09/25/2019   Mammogram  03/31/2022   Flu Shot  01/04/2023   Medicare Annual Wellness Visit  02/22/2024   Colon Cancer Screening  03/04/2025   Pap with HPV screening  04/20/2025   DTaP/Tdap/Td vaccine (2 - Tdap) 04/09/2031   Hepatitis C Screening  Completed   HIV Screening  Completed   HPV Vaccine  Aged Out    Advanced directives: (ACP Link)Information on Advanced Care Planning can be found at [facility] Advance Health Care Directives Advance Health Care Directives ([email or web address]   Next Medicare Annual Wellness Visit scheduled for next year: Yes  insert Preventive Care Attachment Reference

## 2023-02-24 ENCOUNTER — Other Ambulatory Visit: Payer: Self-pay | Admitting: Internal Medicine

## 2023-02-24 DIAGNOSIS — I10 Essential (primary) hypertension: Secondary | ICD-10-CM

## 2023-02-27 ENCOUNTER — Telehealth: Payer: Self-pay | Admitting: *Deleted

## 2023-02-27 NOTE — Progress Notes (Signed)
Care Coordination   Note   02/27/2023 Name: JERRIE WINDISH MRN: [id number] DOB: March 28, 1966  [name] is a 57 y.o. year old female who sees Masters, Florentina Addison, DO for primary care. I reached out to [name] by phone today to offer care coordination services.  Ms. Cunniff was given information about Care Coordination services today including:   The Care Coordination services include support from the care team which includes your Nurse Coordinator, Clinical Social Worker, or Pharmacist.  The Care Coordination team is here to help remove barriers to the health concerns and goals most important to you. Care Coordination services are voluntary, and the patient may decline or stop services at any time by request to their care team member.   Care Coordination Consent Status: Patient agreed to services and verbal consent obtained.   Follow up plan:  Telephone appointment with care coordination team member scheduled for:  03/13/23  Encounter Outcome:  Patient Scheduled  [facility] Coordination Care Guide  Direct Dial: [phone]

## 2023-02-28 DIAGNOSIS — I700 Atherosclerosis of aorta: Secondary | ICD-10-CM | POA: Diagnosis not present

## 2023-02-28 DIAGNOSIS — I5032 Chronic diastolic (congestive) heart failure: Secondary | ICD-10-CM | POA: Diagnosis not present

## 2023-02-28 DIAGNOSIS — I10 Essential (primary) hypertension: Secondary | ICD-10-CM | POA: Diagnosis not present

## 2023-03-02 ENCOUNTER — Encounter: Payer: 59 | Admitting: Student

## 2023-03-06 DIAGNOSIS — I5032 Chronic diastolic (congestive) heart failure: Secondary | ICD-10-CM | POA: Diagnosis not present

## 2023-03-06 DIAGNOSIS — I700 Atherosclerosis of aorta: Secondary | ICD-10-CM | POA: Diagnosis not present

## 2023-03-06 DIAGNOSIS — G4733 Obstructive sleep apnea (adult) (pediatric): Secondary | ICD-10-CM | POA: Diagnosis not present

## 2023-03-06 DIAGNOSIS — I10 Essential (primary) hypertension: Secondary | ICD-10-CM | POA: Diagnosis not present

## 2023-03-13 ENCOUNTER — Encounter: Payer: 59 | Admitting: *Deleted

## 2023-03-13 ENCOUNTER — Other Ambulatory Visit: Payer: Self-pay

## 2023-03-13 MED ORDER — SPIRONOLACTONE 25 MG PO TABS: 25.0000 mg | ORAL_TABLET | Freq: Every day | ORAL | 1 refills | Status: DC

## 2023-03-19 ENCOUNTER — Encounter: Payer: Self-pay | Admitting: *Deleted

## 2023-03-19 ENCOUNTER — Encounter: Payer: 59 | Admitting: Student

## 2023-03-19 ENCOUNTER — Telehealth: Payer: Self-pay | Admitting: *Deleted

## 2023-03-19 NOTE — Patient Outreach (Signed)
  Care Coordination   03/19/2023 Name: VIKKIE GOEDEN MRN: [id number] DOB: 23-Apr-1966   Care Coordination Outreach Attempts:  An unsuccessful telephone outreach was attempted today to offer the patient information about available care coordination services.  Follow Up Plan:  Additional outreach attempts will be made to offer the patient care coordination information and services.   Encounter Outcome:  No Answer   Care Coordination Interventions:  No, not indicated    Reece Levy, MSW, LCSW Clinical Social Worker [phone]

## 2023-03-23 ENCOUNTER — Telehealth: Payer: Self-pay | Admitting: *Deleted

## 2023-03-23 NOTE — Progress Notes (Unsigned)
  Care Coordination Note  03/23/2023 Name: AREEJ FLOOK MRN: [id number] DOB: December 01, 1965  [name] is a 57 y.o. year old female who is a primary care patient of Masters, Florentina Addison, DO and is actively engaged with the care management team. I reached out to [name] by phone today to assist with re-scheduling an initial visit with the Licensed Clinical Social Worker  Follow up plan: Unsuccessful telephone outreach attempt made. A HIPAA compliant phone message was left for the patient providing contact information and requesting a return call.   [facility]  Care Coordination Care Guide  Direct Dial: [phone]

## 2023-03-26 NOTE — Progress Notes (Signed)
  Care Coordination Note  03/26/2023 Name: SHANA KRISHNAMURTHY MRN: [id number] DOB: 1966/02/18  [name] is a 57 y.o. year old female who is a primary care patient of Masters, Florentina Addison, DO and is actively engaged with the care management team. I reached out to [name] by phone today to assist with re-scheduling an initial visit with the Licensed Clinical Social Worker  Follow up plan: Telephone appointment with care management team member scheduled for:03/27/23  [facility]  Care Coordination Care Guide  Direct Dial: [phone]

## 2023-03-27 ENCOUNTER — Ambulatory Visit: Payer: Self-pay | Admitting: *Deleted
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
## 2023-03-27 NOTE — Patient Instructions (Signed)
Visit Information  Thank you for taking time to visit with me today. Please don't hesitate to contact me if I can be of assistance to you.   Following are the goals we discussed today:   Goals Addressed             This Visit's Progress    Provide resources, support and guidance to reduce symptoms of depression and anxiety       Activities and task to complete in order to accomplish goals.   Call your insurance provider for more information about your Enhanced Benefits  Review email with resources to review and consider- Call (once selected from email list) and follow up on getting appointments with mental health counselor and med management provider Follow up on food resources discussed ([employer]com.ee  AdDates.cz https://www.google.com/search?q=food+resources+in+[place]&oq=food+resources+in+[place]&gs_lcrp=[id number]&sourceid=chrome&ie=UTF-8 Start / continue relaxed breathing 3 times daily Review your EMMI educational information Look for an e-mail from [facility]. Continue with Guardianship for daughter and your Living Will/Advance Directive packet        Our next appointment is by telephone on 03/30/23  Please call the care guide team at [phone] if you need to cancel or reschedule your appointment.   If you are experiencing a Mental Health or Behavioral Health Crisis or need someone to talk to, please call the Suicide and Crisis Lifeline: 988 call 911   Patient verbalizes understanding of instructions and care plan provided today and agrees to view in MyChart. Active MyChart status and patient understanding of how to access instructions and care plan via MyChart confirmed with patient.     Telephone follow up appointment with care management team member scheduled for:03/30/23  Reece Levy, MSW, LCSW [facility]  [facility], [facility]  Health Licensed Clinical Social Worker Care Coordinator  [phone]

## 2023-03-30 ENCOUNTER — Ambulatory Visit: Payer: Self-pay | Admitting: *Deleted

## 2023-03-30 NOTE — Patient Instructions (Signed)
Visit Information  Thank you for taking time to visit with me today. Please don't hesitate to contact me if I can be of assistance to you.   Following are the goals we discussed today:   Goals Addressed             This Visit's Progress    Provide resources, support and guidance to reduce symptoms of depression and anxiety       Activities and task to complete in order to accomplish goals.   Call your insurance provider for more information about your Enhanced Benefits  Review email with resources to review and consider- resent today Call (once selected from email list) and follow up on getting appointments with mental health counselor and med management provider Follow up on food resources discussed ([employer]com.ee  AdDates.cz https://www.google.com/search?q=food+resources+in+[place]&oq=food+resources+in+[place]&gs_lcrp=[id number]&sourceid=chrome&ie=UTF-8 Start / continue relaxed breathing 3 times daily Review your EMMI educational information Look for an e-mail from [facility]. Continue with Guardianship for daughter and your Living Will/Advance Directive packet        Our next appointment is by telephone on 04/06/23    Please call the care guide team at [phone] if you need to cancel or reschedule your appointment.   If you are experiencing a Mental Health or Behavioral Health Crisis or need someone to talk to, please call the Suicide and Crisis Lifeline: 988 call 911   The patient verbalized understanding of instructions, educational materials, and care plan provided today and DECLINED offer to receive copy of patient instructions, educational materials, and care plan.   Telephone follow up appointment with care management team member scheduled for:04/06/23  Reece Levy, MSW, LCSW [facility]  [facility], [facility] Health Licensed  Clinical Social Worker Care Coordinator  [phone]
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
## 2023-04-02 ENCOUNTER — Other Ambulatory Visit: Payer: Self-pay | Admitting: Internal Medicine

## 2023-04-02 DIAGNOSIS — E2740 Unspecified adrenocortical insufficiency: Secondary | ICD-10-CM

## 2023-04-06 ENCOUNTER — Other Ambulatory Visit: Payer: Self-pay

## 2023-04-06 ENCOUNTER — Encounter: Payer: Self-pay | Admitting: *Deleted

## 2023-04-06 DIAGNOSIS — E785 Hyperlipidemia, unspecified: Secondary | ICD-10-CM

## 2023-04-06 DIAGNOSIS — I5032 Chronic diastolic (congestive) heart failure: Secondary | ICD-10-CM

## 2023-04-06 DIAGNOSIS — Z9689 Presence of other specified functional implants: Secondary | ICD-10-CM

## 2023-04-06 DIAGNOSIS — G8929 Other chronic pain: Secondary | ICD-10-CM

## 2023-04-06 DIAGNOSIS — Z79899 Other long term (current) drug therapy: Secondary | ICD-10-CM

## 2023-04-06 DIAGNOSIS — I10 Essential (primary) hypertension: Secondary | ICD-10-CM

## 2023-04-06 MED ORDER — NITROGLYCERIN 0.4 MG SL SUBL
SUBLINGUAL_TABLET | SUBLINGUAL | 2 refills | Status: AC
Start: 2023-04-06 — End: ?

## 2023-04-10 ENCOUNTER — Telehealth: Payer: Self-pay | Admitting: *Deleted

## 2023-04-10 NOTE — Patient Outreach (Signed)
  Care Coordination    late entry 04/06/23 Name: [name] MRN: [id number] DOB: Feb 01, 1966   Care Coordination Outreach Attempts:  An unsuccessful telephone outreach was attempted today to offer the patient information about available care coordination services.  Follow Up Plan:  Additional outreach attempts will be made to offer the patient care coordination information and services.   Encounter Outcome:  No Answer   Care Coordination Interventions:  No, not indicated    Reece Levy, MSW, LCSW [facility]  [facility], [facility] Health Licensed Clinical Social Worker Care Coordinator  [phone]

## 2023-04-10 NOTE — Patient Outreach (Signed)
  Care Coordination   04/10/2023 Name: ANEISHA SKYLES MRN: [id number] DOB: 10/04/1965   Care Coordination Outreach Attempts:  A second unsuccessful outreach was attempted today to offer the patient with information about available care coordination services.  Follow Up Plan:  Additional outreach attempts will be made to offer the patient care coordination information and services.   Encounter Outcome:  No Answer   Care Coordination Interventions:  No, not indicated    Reece Levy, MSW, LCSW [facility]  [facility], [facility] Health Licensed Clinical Social Worker Care Coordinator  [phone]

## 2023-04-17 DIAGNOSIS — G4733 Obstructive sleep apnea (adult) (pediatric): Secondary | ICD-10-CM | POA: Diagnosis not present

## 2023-04-18 DIAGNOSIS — G4733 Obstructive sleep apnea (adult) (pediatric): Secondary | ICD-10-CM | POA: Diagnosis not present

## 2023-05-06 ENCOUNTER — Other Ambulatory Visit: Payer: Self-pay | Admitting: Internal Medicine

## 2023-05-06 DIAGNOSIS — E2740 Unspecified adrenocortical insufficiency: Secondary | ICD-10-CM

## 2023-05-07 ENCOUNTER — Telehealth: Payer: Self-pay | Admitting: *Deleted

## 2023-05-07 NOTE — Telephone Encounter (Signed)
Pt was called to schedule an appt. LOV was 11/2022. Appt has been scheduled with Dr Welton Flakes on 12/17.

## 2023-05-07 NOTE — Patient Outreach (Signed)
  Care Coordination   05/07/2023 Name: [name] MRN: [id number] DOB: 11-16-1965   Care Coordination Outreach Attempts:  A second unsuccessful outreach was attempted today to offer the patient with information about available care coordination services.  Follow Up Plan:  Additional outreach attempts will be made to offer the patient care coordination information and services.   Encounter Outcome:  No Answer   Care Coordination Interventions:  No, not indicated    Reece Levy, MSW, LCSW [facility]  [facility], [facility] Health Licensed Clinical Social Worker Care Coordinator  [phone]

## 2023-05-10 ENCOUNTER — Telehealth: Payer: Self-pay | Admitting: *Deleted

## 2023-05-10 DIAGNOSIS — R0609 Other forms of dyspnea: Secondary | ICD-10-CM | POA: Diagnosis not present

## 2023-05-10 DIAGNOSIS — J4550 Severe persistent asthma, uncomplicated: Secondary | ICD-10-CM | POA: Diagnosis not present

## 2023-05-10 DIAGNOSIS — R0989 Other specified symptoms and signs involving the circulatory and respiratory systems: Secondary | ICD-10-CM | POA: Diagnosis not present

## 2023-05-10 DIAGNOSIS — Z23 Encounter for immunization: Secondary | ICD-10-CM | POA: Diagnosis not present

## 2023-05-10 NOTE — Patient Outreach (Signed)
  Care Coordination   05/10/2023 Name: [name] MRN: [id number] DOB: Oct 18, 1965   Care Coordination Outreach Attempts:  A third unsuccessful outreach was attempted today to offer the patient with information about available care coordination services.  Follow Up Plan:  No further outreach attempts will be made at this time. We have been unable to contact the patient to offer or enroll patient in care coordination services  Encounter Outcome:  No Answer   Care Coordination Interventions:  No, not indicated    Reece Levy, MSW, LCSW [facility], [facility] Licensed Clinical Social Worker Care Coordinator  [phone]

## 2023-05-14 DIAGNOSIS — Z79899 Other long term (current) drug therapy: Secondary | ICD-10-CM | POA: Diagnosis not present

## 2023-05-14 DIAGNOSIS — M7061 Trochanteric bursitis, right hip: Secondary | ICD-10-CM | POA: Diagnosis not present

## 2023-05-14 DIAGNOSIS — G894 Chronic pain syndrome: Secondary | ICD-10-CM | POA: Diagnosis not present

## 2023-05-14 DIAGNOSIS — M545 Low back pain, unspecified: Secondary | ICD-10-CM | POA: Diagnosis not present

## 2023-05-14 DIAGNOSIS — M7062 Trochanteric bursitis, left hip: Secondary | ICD-10-CM | POA: Diagnosis not present

## 2023-05-15 DIAGNOSIS — S299XXA Unspecified injury of thorax, initial encounter: Secondary | ICD-10-CM | POA: Diagnosis not present

## 2023-05-15 DIAGNOSIS — Y92009 Unspecified place in unspecified non-institutional (private) residence as the place of occurrence of the external cause: Secondary | ICD-10-CM | POA: Diagnosis not present

## 2023-05-15 DIAGNOSIS — R451 Restlessness and agitation: Secondary | ICD-10-CM | POA: Diagnosis not present

## 2023-05-15 DIAGNOSIS — S4991XA Unspecified injury of right shoulder and upper arm, initial encounter: Secondary | ICD-10-CM | POA: Diagnosis not present

## 2023-05-15 DIAGNOSIS — E236 Other disorders of pituitary gland: Secondary | ICD-10-CM | POA: Diagnosis not present

## 2023-05-15 DIAGNOSIS — W010XXA Fall on same level from slipping, tripping and stumbling without subsequent striking against object, initial encounter: Secondary | ICD-10-CM | POA: Diagnosis not present

## 2023-05-15 DIAGNOSIS — S8991XA Unspecified injury of right lower leg, initial encounter: Secondary | ICD-10-CM | POA: Diagnosis not present

## 2023-05-15 DIAGNOSIS — S31139A Puncture wound of abdominal wall without foreign body, unspecified quadrant without penetration into peritoneal cavity, initial encounter: Secondary | ICD-10-CM | POA: Diagnosis not present

## 2023-05-15 DIAGNOSIS — S0990XA Unspecified injury of head, initial encounter: Secondary | ICD-10-CM | POA: Diagnosis not present

## 2023-05-15 DIAGNOSIS — M546 Pain in thoracic spine: Secondary | ICD-10-CM | POA: Diagnosis not present

## 2023-05-15 DIAGNOSIS — K219 Gastro-esophageal reflux disease without esophagitis: Secondary | ICD-10-CM | POA: Diagnosis not present

## 2023-05-15 DIAGNOSIS — I2510 Atherosclerotic heart disease of native coronary artery without angina pectoris: Secondary | ICD-10-CM | POA: Diagnosis not present

## 2023-05-15 DIAGNOSIS — E039 Hypothyroidism, unspecified: Secondary | ICD-10-CM | POA: Diagnosis not present

## 2023-05-15 DIAGNOSIS — I252 Old myocardial infarction: Secondary | ICD-10-CM | POA: Diagnosis not present

## 2023-05-15 DIAGNOSIS — R937 Abnormal findings on diagnostic imaging of other parts of musculoskeletal system: Secondary | ICD-10-CM | POA: Diagnosis not present

## 2023-05-15 DIAGNOSIS — G8911 Acute pain due to trauma: Secondary | ICD-10-CM | POA: Diagnosis not present

## 2023-05-15 DIAGNOSIS — M542 Cervicalgia: Secondary | ICD-10-CM | POA: Diagnosis not present

## 2023-05-15 DIAGNOSIS — I509 Heart failure, unspecified: Secondary | ICD-10-CM | POA: Diagnosis not present

## 2023-05-15 DIAGNOSIS — S79911A Unspecified injury of right hip, initial encounter: Secondary | ICD-10-CM | POA: Diagnosis not present

## 2023-05-15 DIAGNOSIS — Z9682 Presence of neurostimulator: Secondary | ICD-10-CM | POA: Diagnosis not present

## 2023-05-15 DIAGNOSIS — S199XXA Unspecified injury of neck, initial encounter: Secondary | ICD-10-CM | POA: Diagnosis not present

## 2023-05-15 DIAGNOSIS — S3992XA Unspecified injury of lower back, initial encounter: Secondary | ICD-10-CM | POA: Diagnosis not present

## 2023-05-15 DIAGNOSIS — M25551 Pain in right hip: Secondary | ICD-10-CM | POA: Diagnosis not present

## 2023-05-15 DIAGNOSIS — R519 Headache, unspecified: Secondary | ICD-10-CM | POA: Diagnosis not present

## 2023-05-15 DIAGNOSIS — M25511 Pain in right shoulder: Secondary | ICD-10-CM | POA: Diagnosis not present

## 2023-05-15 DIAGNOSIS — S79921A Unspecified injury of right thigh, initial encounter: Secondary | ICD-10-CM | POA: Diagnosis not present

## 2023-05-15 DIAGNOSIS — G44309 Post-traumatic headache, unspecified, not intractable: Secondary | ICD-10-CM | POA: Diagnosis not present

## 2023-05-15 DIAGNOSIS — W19XXXA Unspecified fall, initial encounter: Secondary | ICD-10-CM | POA: Diagnosis not present

## 2023-05-15 DIAGNOSIS — M439 Deforming dorsopathy, unspecified: Secondary | ICD-10-CM | POA: Diagnosis not present

## 2023-05-15 DIAGNOSIS — R930 Abnormal findings on diagnostic imaging of skull and head, not elsewhere classified: Secondary | ICD-10-CM | POA: Diagnosis not present

## 2023-05-15 DIAGNOSIS — S31030A Puncture wound without foreign body of lower back and pelvis without penetration into retroperitoneum, initial encounter: Secondary | ICD-10-CM | POA: Diagnosis not present

## 2023-05-16 DIAGNOSIS — S79921A Unspecified injury of right thigh, initial encounter: Secondary | ICD-10-CM | POA: Diagnosis not present

## 2023-05-16 DIAGNOSIS — S4991XA Unspecified injury of right shoulder and upper arm, initial encounter: Secondary | ICD-10-CM | POA: Diagnosis not present

## 2023-05-16 DIAGNOSIS — S79911A Unspecified injury of right hip, initial encounter: Secondary | ICD-10-CM | POA: Diagnosis not present

## 2023-05-16 DIAGNOSIS — S8991XA Unspecified injury of right lower leg, initial encounter: Secondary | ICD-10-CM | POA: Diagnosis not present

## 2023-05-17 ENCOUNTER — Telehealth: Payer: Self-pay

## 2023-05-17 NOTE — Transitions of Care (Post Inpatient/ED Visit) (Signed)
05/17/2023  Name: [name] MRN: [id number] DOB: 1966-02-02  Today's TOC FU Call Status: Today's TOC FU Call Status:: Successful TOC FU Call Completed TOC FU Call Complete Date: 05/17/23 Patient's Name and Date of Birth confirmed.  Transition Care Management Follow-up Telephone Call Date of Discharge: 05/16/23 Discharge Facility: Other [facility]) Name of Other ([facility]) Discharge Facility: [facility] Type of Discharge: Emergency Department Reason for ED Visit: Other: (fall) How have you been since you were released from the hospital?: Same Any questions or concerns?: No  Items Reviewed: Did you receive and understand the discharge instructions provided?: Yes Medications obtained,verified, and reconciled?: Yes (Medications Reviewed) Any new allergies since your discharge?: No Dietary orders reviewed?: NA Do you have support at home?: Yes People in Home: other relative(s)  Medications Reviewed Today: Medications Reviewed Today     Reviewed by Karena Addison, LPN (Licensed Practical Nurse) on 05/17/23 at 1056  Med List Status: <None>   Medication Order Taking? Sig Documenting Provider Last Dose Status Informant  acetaminophen (TYLENOL) 325 MG tablet [id number] No Take 650 mg by mouth every 6 (six) hours as needed for headache.  [provider] Taking Active Self  albuterol (PROVENTIL HFA;VENTOLIN HFA) 108 (90 Base) MCG/ACT inhaler [id number] No Inhale 2 puffs into the lungs every 6 (six) hours as needed for wheezing or shortness of breath. Lars Masson, MD Taking Active Self  amitriptyline (ELAVIL) 25 MG tablet [id number] No Take 25 mg by mouth at bedtime. For pain [provider] Taking Active Self  carvedilol (COREG) 25 MG tablet [id number] No Take 1.5 tablets (37.5 mg total) by mouth 2 (two) times daily with a meal. O'Neal, Ronnald Ramp, MD Taking Active   famotidine (PEPCID) 20 MG tablet [id number] No Take 20 mg by mouth 2 (two) times daily.  [provider] Taking Active Self  furosemide (LASIX) 20 MG tablet [id number] No TAKE 1 TABLET (20 MG TOTAL) BY MOUTH DAILY AS NEEDED FOR EDEMA. Sande Rives, MD Taking Active   gabapentin (NEURONTIN) 600 MG tablet [id number] No Take 1 tablet (600 mg total) by mouth 3 (three) times daily. Levert Feinstein, MD Taking Active Self  Galcanezumab-gnlm [facility]) 120 MG/ML Ivory Broad [id number] No Inject 1 Application into the skin every 30 (thirty) days. Masters, Psychiatric nurse, DO Taking Active Self  hydrocortisone (CORTEF) 5 MG tablet [id number]  TAKE 2 TABLETS (10 MG TOTAL) BY MOUTH IN THE MORNING AND 1 TABLET (5 MG TOTAL) DAILY IN THE EVENING. Masters, Florentina Addison, DO  Active   irbesartan (AVAPRO) 150 MG tablet [id number]  TAKE 1 TABLET BY MOUTH EVERY DAY Masters, Florentina Addison, DO  Active   isosorbide mononitrate (IMDUR) 30 MG 24 hr tablet [id number] No Take 3 tablets (90 mg total) by mouth 2 (two) times daily. Meriam Sprague, MD Taking Active Self  levothyroxine (SYNTHROID) 50 MCG tablet [id number] No TAKE 1 TABLET BY MOUTH EVERY DAY Masters, Katie, DO Taking Active   lidocaine-prilocaine (EMLA) cream [id number] No APPLY TOPICALLY 1 APPLICATION AS NEEDED Masters, Florentina Addison, DO Taking Active   methocarbamol (ROBAXIN) 750 MG tablet [id number] No Take 750 mg by mouth every 8 (eight) hours as needed for muscle spasms. [provider] Taking Active Self  nitroGLYCERIN (NITROSTAT) 0.4 MG SL tablet [id number]  PLACE 1 TAB UNDER TONGUE EVERY 5 MINUTES AS NEEDED FOR CHEST PAIN, MAX 3/15 MINS Gaston Islam., NP  Active   SAVELLA 12.5 MG TABS [id number] No Take 12.5 mg by mouth in the morning and at bedtime. [provider] Taking Active Self  spironolactone (ALDACTONE) 25 MG tablet [id number]  Take 1 tablet (25 mg total) by mouth daily. Sande Rives, MD  Active   topiramate (TOPAMAX) 200 MG tablet [id number] No Take 1 tablet (200 mg total) by mouth 2 (two) times daily. Levert Feinstein, MD Taking Active Self   Ubrogepant (UBRELVY) 50 MG TABS [id number] No Take 1 tab at onset of migraine, may repeat in 2 hours. Max dose 2 tabs/ day  Patient not taking: Reported on 02/22/2023   Rudene Christians, DO Not Taking Active Self  vitamin D3 (CHOLECALCIFEROL) 25 MCG tablet [id number] No Take 1 tablet (1,000 Units total) by mouth daily. Nooruddin, Jason Fila, MD Taking Active             Home Care and Equipment/Supplies: Were Home Health Services Ordered?: NA Any new equipment or medical supplies ordered?: NA  Functional Questionnaire: Do you need assistance with bathing/showering or dressing?: No Do you need assistance with meal preparation?: No Do you need assistance with eating?: No Do you have difficulty maintaining continence: No Do you need assistance with getting out of bed/getting out of a chair/moving?: No Do you have difficulty managing or taking your medications?: No  Follow up appointments reviewed: PCP Follow-up appointment confirmed?: Yes Date of PCP follow-up appointment?: 05/22/23 Follow-up Provider: [facility] Follow-up appointment confirmed?: NA Do you need transportation to your follow-up appointment?: No Do you understand care options if your condition(s) worsen?: Yes-patient verbalized understanding    SIGNATURE Karena Addison, LPN [facility] Nurse Health Advisor Direct Dial [phone]

## 2023-05-18 DIAGNOSIS — J4550 Severe persistent asthma, uncomplicated: Secondary | ICD-10-CM | POA: Diagnosis not present

## 2023-05-22 ENCOUNTER — Ambulatory Visit (INDEPENDENT_AMBULATORY_CARE_PROVIDER_SITE_OTHER): Payer: 59 | Admitting: Internal Medicine

## 2023-05-22 ENCOUNTER — Encounter: Payer: Self-pay | Admitting: Internal Medicine

## 2023-05-22 VITALS — BP 143/93 | HR 83 | Temp 98.4°F | Ht 65.5 in | Wt 225.8 lb

## 2023-05-22 DIAGNOSIS — I110 Hypertensive heart disease with heart failure: Secondary | ICD-10-CM

## 2023-05-22 DIAGNOSIS — I201 Angina pectoris with documented spasm: Secondary | ICD-10-CM

## 2023-05-22 DIAGNOSIS — E2740 Unspecified adrenocortical insufficiency: Secondary | ICD-10-CM | POA: Diagnosis not present

## 2023-05-22 DIAGNOSIS — I700 Atherosclerosis of aorta: Secondary | ICD-10-CM | POA: Diagnosis not present

## 2023-05-22 DIAGNOSIS — E785 Hyperlipidemia, unspecified: Secondary | ICD-10-CM

## 2023-05-22 DIAGNOSIS — I5032 Chronic diastolic (congestive) heart failure: Secondary | ICD-10-CM | POA: Diagnosis not present

## 2023-05-22 DIAGNOSIS — E559 Vitamin D deficiency, unspecified: Secondary | ICD-10-CM

## 2023-05-22 DIAGNOSIS — G40919 Epilepsy, unspecified, intractable, without status epilepticus: Secondary | ICD-10-CM

## 2023-05-22 DIAGNOSIS — G720 Drug-induced myopathy: Secondary | ICD-10-CM

## 2023-05-22 DIAGNOSIS — W19XXXD Unspecified fall, subsequent encounter: Secondary | ICD-10-CM | POA: Diagnosis not present

## 2023-05-22 DIAGNOSIS — G4733 Obstructive sleep apnea (adult) (pediatric): Secondary | ICD-10-CM

## 2023-05-22 DIAGNOSIS — I10 Essential (primary) hypertension: Secondary | ICD-10-CM | POA: Diagnosis not present

## 2023-05-22 DIAGNOSIS — F332 Major depressive disorder, recurrent severe without psychotic features: Secondary | ICD-10-CM

## 2023-05-22 MED ORDER — ESCITALOPRAM OXALATE 10 MG PO TABS
10.0000 mg | ORAL_TABLET | Freq: Every day | ORAL | 11 refills | Status: DC
Start: 2023-05-22 — End: 2023-06-13

## 2023-05-22 MED ORDER — CARVEDILOL 25 MG PO TABS: 25.0000 mg | ORAL_TABLET | Freq: Two times a day (BID) | ORAL | Status: DC

## 2023-05-22 MED ORDER — SPIRONOLACTONE 50 MG PO TABS
50.0000 mg | ORAL_TABLET | Freq: Every day | ORAL | 0 refills | Status: DC
Start: 2023-05-22 — End: 2023-08-16

## 2023-05-22 MED ORDER — DULOXETINE HCL 30 MG PO CPEP
30.0000 mg | ORAL_CAPSULE | Freq: Every day | ORAL | 11 refills | Status: DC
Start: 2023-05-22 — End: 2023-05-22

## 2023-05-22 MED ORDER — CHLORTHALIDONE 25 MG PO TABS: 25.0000 mg | ORAL_TABLET | Freq: Every day | ORAL | 0 refills | Status: DC

## 2023-05-22 NOTE — Assessment & Plan Note (Signed)
Pt continues to take hydrocortisone 10 mg in the am and 5 mg in the pm. She has not followed with endocrinology although referral placed on 01/2023 and has been approved. Gave pt the number of the clinic and advised to set up an appointment today. Pt in agreement. Will continue current hydrocortisone dosage until follow up.

## 2023-05-22 NOTE — Assessment & Plan Note (Signed)
Pt with seizure disorder and is current on topamax 200 mg BID. She has close follow up with neurology and previously has vagus nerve stimulator placed. States she had seizure in the past month and was advised by neurologist to keep a diary of them. She has an appointment with neurologist coming up next week. Advised to keep that appointment and take her diary to her appointment.

## 2023-05-22 NOTE — Assessment & Plan Note (Addendum)
Pt's with hx of HTN that is uncontrolled. She is on Coreg 37.5 mg BID, Spirinolactone 25 mg every day, irbesartan 150 mg daily, IMDUR 90 mg BID. At home majority readings >140/90. Will decrease her Coreg to 25 mg BID and increase Spironolactone to 50 mg every day and start chlorthalidone 25 mg daily. Will have pt monitor BP closely at home. Baseline BMP obtained given recent BMP showing hypokalemia. Follow up in 3-4 weeks.

## 2023-05-22 NOTE — Assessment & Plan Note (Signed)
Pt with mechanical fall at home. She presented to the ED and was imaging did not show any acute bleed or fracture. Pt states she has diffuse muscle pain but overall is improving. Advised to continue current conservative measures and incorporate tylenol as it will help with some myalgias. Shoulder with good ROM and good strength.

## 2023-05-22 NOTE — Assessment & Plan Note (Signed)
Pt has OSA and reports wearing CPAP around 5 hours nightly. Encouraged more adherence will lead to more benefits.

## 2023-05-22 NOTE — Assessment & Plan Note (Addendum)
Pt with MDD that is taking elavil 25 mg at bedtime. She states she takes this prn. Overall reporting good mood today. PHQ9 performed and shows elevated score at 17 which is elevated. Discussed therapy with patient and she is interested. Will send referral for [facility] initiate this process. Will also start patient on lexapro as elavil can increase falls in patients. Initial consideration was for duloxetine but will avoid that given pt is on milnacipran already.

## 2023-05-22 NOTE — Assessment & Plan Note (Signed)
Repeat vitamin D level ordered with today's blood work.

## 2023-05-22 NOTE — Assessment & Plan Note (Signed)
Last lipid panel in 08/2021. LDL 61. Will get repeat this visit. Pt with hx of aortic atherosclerosis so will keep LDL <70. Pt has statin myopathy but if LDL is elevated can start zetia or other medications.

## 2023-05-22 NOTE — Patient Instructions (Addendum)
[name], it was a pleasure seeing you today! You endorsed feeling well today. Below are some of the things we talked about this visit. We look forward to seeing you in the follow up appointment!  Today we discussed: For your high blood pressure, take coreg 25 mg BID, we will increase your spirinolactone to 50 mg daily and start you on chlorthalidone 25 mg daily. Check your blood pressure daily and keep a log of it.   For your adrenal insufficiency: follow up with endocrinologist. The number is [phone]. It is located in [place].   I have referred you to therapist and for an exercise program.   I have ordered the following labs today:   Lab Orders         BMP8+Anion Gap         Lipid Profile         Vitamin D (25 hydroxy)       Referrals ordered today:    Referral Orders         Amb Referral To Provider Referral Exercise Program (P.R.E.P)         Ambulatory referral to Integrated [facility]al [facility]      I have ordered the following medication/changed the following medications:   Stop the following medications: Medications Discontinued During This Encounter  Medication Reason   Ubrogepant (UBRELVY) 50 MG TABS Patient has not taken in last 30 days   carvedilol (COREG) 25 MG tablet    spironolactone (ALDACTONE) 25 MG tablet Reorder     Start the following medications: Meds ordered this encounter  Medications   spironolactone (ALDACTONE) 50 MG tablet    Sig: Take 1 tablet (50 mg total) by mouth daily.    Dispense:  90 tablet    Refill:  0   chlorthalidone (HYGROTON) 25 MG tablet    Sig: Take 1 tablet (25 mg total) by mouth daily.    Dispense:  90 tablet    Refill:  0   carvedilol (COREG) 25 MG tablet    Sig: Take 1 tablet (25 mg total) by mouth 2 (two) times daily with a meal.     Follow-up: 3-4 week follow up with Dr. Welton Flakes  Please make sure to arrive 15 minutes prior to your next appointment. If you arrive late, you may be asked to reschedule.   We look  forward to seeing you next time. Please call our clinic at [phone] if you have any questions or concerns. The best time to call is Monday-Friday from 9am-4pm, but there is someone available 24/7. If after hours or the weekend, call the main hospital number and ask for the Internal Medicine Resident On-Call. If you need medication refills, please notify your pharmacy one week in advance and they will send [place] a request.  Thank you for letting us take part in your care. Wishing you the best!  Thank you, Gwenevere Abbot, MD

## 2023-05-22 NOTE — Assessment & Plan Note (Signed)
Well controlled on Imdur 90 mg BID. Has prn nitroglycerin with refills.
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
## 2023-05-22 NOTE — Assessment & Plan Note (Addendum)
Pt euvolemic on exam today. Will continue coreg and spironolactone with dose changes to 25 mg BID and 50 mg daily respectively. Pt using lasix once weekly and this may further decrease with addition of chlorthalidone. Will continue to work on controlling HTN. At next visit will discuss starting GLP 1 agonist which have shown to help with HFpEF. Referral to physician recommended exercise program placed.

## 2023-05-23 LAB — LIPID PANEL
Chol/HDL Ratio: 2.3 {ratio} (ref 0.0–4.4)
Cholesterol, Total: 144 mg/dL (ref 100–199)
HDL: 64 mg/dL (ref >39)
LDL Chol Calc (NIH): 67 mg/dL (ref 0–99)
Triglycerides: 63 mg/dL (ref 0–149)
VLDL Cholesterol Cal: 13 mg/dL (ref 5–40)

## 2023-05-23 LAB — BMP8+ANION GAP
Anion Gap: 13 mmol/L (ref 10.0–18.0)
BUN/Creatinine Ratio: 18 (ref 9–23)
BUN: 13 mg/dL (ref 6–24)
CO2: 20 mmol/L (ref 20–29)
Calcium: 8.7 mg/dL (ref 8.7–10.2)
Chloride: 110 mmol/L — ABNORMAL HIGH (ref 96–106)
Creatinine, Ser: 0.74 mg/dL (ref 0.57–1.00)
Glucose: 86 mg/dL (ref 70–99)
Potassium: 4.2 mmol/L (ref 3.5–5.2)
Sodium: 143 mmol/L (ref 134–144)
eGFR: 93 mL/min/{1.73_m2} (ref >59)

## 2023-05-23 LAB — VITAMIN D 25 HYDROXY (VIT D DEFICIENCY, FRACTURES): Vit D, 25-Hydroxy: 13.3 ng/mL — ABNORMAL LOW (ref 30.0–100.0)

## 2023-05-25 ENCOUNTER — Telehealth: Payer: Self-pay | Admitting: *Deleted

## 2023-05-25 NOTE — Telephone Encounter (Signed)
Contacted regarding [facility]P [facility] referral. Left voice message to return my call for more information.

## 2023-05-28 NOTE — Progress Notes (Signed)
Internal Medicine Clinic Attending  Case discussed with the resident at the time of the visit.  We reviewed the resident's history and exam and pertinent patient test results.  I agree with the assessment, diagnosis, and plan of care documented in the resident's note.  

## 2023-05-29 DIAGNOSIS — Z9689 Presence of other specified functional implants: Secondary | ICD-10-CM | POA: Diagnosis not present

## 2023-05-29 DIAGNOSIS — Z9682 Presence of neurostimulator: Secondary | ICD-10-CM | POA: Diagnosis not present

## 2023-05-29 DIAGNOSIS — K5909 Other constipation: Secondary | ICD-10-CM | POA: Diagnosis not present

## 2023-05-29 DIAGNOSIS — G43709 Chronic migraine without aura, not intractable, without status migrainosus: Secondary | ICD-10-CM | POA: Diagnosis not present

## 2023-05-29 DIAGNOSIS — I5181 Takotsubo syndrome: Secondary | ICD-10-CM | POA: Diagnosis not present

## 2023-05-29 DIAGNOSIS — G4733 Obstructive sleep apnea (adult) (pediatric): Secondary | ICD-10-CM | POA: Diagnosis not present

## 2023-06-02 MED ORDER — VITAMIN D (ERGOCALCIFEROL) 1.25 MG (50000 UNIT) PO CAPS
50000.0000 [IU] | ORAL_CAPSULE | ORAL | 1 refills | Status: DC
Start: 2023-06-02 — End: 2023-06-29

## 2023-06-02 NOTE — Addendum Note (Signed)
Addended by: Gwenevere Abbot on: 06/02/2023 01:00 PM   Modules accepted: Orders

## 2023-06-02 NOTE — Addendum Note (Signed)
Addended by: Gwenevere Abbot on: 06/02/2023 01:12 PM   Modules accepted: Orders

## 2023-06-03 ENCOUNTER — Other Ambulatory Visit: Payer: Self-pay | Admitting: Internal Medicine

## 2023-06-03 DIAGNOSIS — E2740 Unspecified adrenocortical insufficiency: Secondary | ICD-10-CM

## 2023-06-04 ENCOUNTER — Telehealth: Payer: Self-pay | Admitting: *Deleted

## 2023-06-04 NOTE — Progress Notes (Signed)
Complex Care Management Note  Care Guide Note 06/04/2023 Name: [name] MRN: [id number] DOB: May 08, 1966  [name] is a 57 y.o. year old female who sees Masters, Florentina Addison, DO for primary care. I reached out to [name] by phone today to offer complex care management services.  [name] was given information about Complex Care Management services today including:   The Complex Care Management services include support from the care team which includes your Nurse Coordinator, Clinical Social Worker, or Pharmacist.  The Complex Care Management team is here to help remove barriers to the health concerns and goals most important to you. Complex Care Management services are voluntary, and the patient may decline or stop services at any time by request to their care team member.   Complex Care Management Consent Status: Patient agreed to services and verbal consent obtained.   Follow up plan:  Telephone appointment with complex care management team member scheduled for:  06/07/2023  Encounter Outcome:  Patient Scheduled  Burman Nieves, [facility] Care Coordination Care Guide Direct Dial: [phone]

## 2023-06-06 DIAGNOSIS — G4733 Obstructive sleep apnea (adult) (pediatric): Secondary | ICD-10-CM | POA: Diagnosis not present

## 2023-06-07 ENCOUNTER — Ambulatory Visit: Payer: Self-pay

## 2023-06-07 NOTE — Patient Outreach (Signed)
  Care Coordination   06/07/2023 Name: BROWNIE GOCKEL MRN: [id number] DOB: 11/16/1965   Care Coordination Outreach Attempts:  An unsuccessful telephone outreach was attempted today to offer the patient information about available complex care management services.  Follow Up Plan:  Additional outreach attempts will be made to offer the patient complex care management information and services.   Encounter Outcome:  No Answer   Care Coordination Interventions:  No, not indicated    Wilbert Weyman PEAK, BSN, [facility]  [facility], [facility]  Care Coordinator Phone: [phone]

## 2023-06-13 ENCOUNTER — Ambulatory Visit (INDEPENDENT_AMBULATORY_CARE_PROVIDER_SITE_OTHER): Payer: 59 | Admitting: Internal Medicine

## 2023-06-13 ENCOUNTER — Other Ambulatory Visit: Payer: Self-pay | Admitting: Internal Medicine

## 2023-06-13 ENCOUNTER — Encounter: Payer: Self-pay | Admitting: Internal Medicine

## 2023-06-13 VITALS — BP 117/81 | HR 90 | Temp 98.2°F | Ht 65.5 in | Wt 226.4 lb

## 2023-06-13 DIAGNOSIS — G43919 Migraine, unspecified, intractable, without status migrainosus: Secondary | ICD-10-CM

## 2023-06-13 DIAGNOSIS — N951 Menopausal and female climacteric states: Secondary | ICD-10-CM

## 2023-06-13 DIAGNOSIS — E2740 Unspecified adrenocortical insufficiency: Secondary | ICD-10-CM

## 2023-06-13 DIAGNOSIS — I10 Essential (primary) hypertension: Secondary | ICD-10-CM | POA: Diagnosis not present

## 2023-06-13 DIAGNOSIS — G4733 Obstructive sleep apnea (adult) (pediatric): Secondary | ICD-10-CM | POA: Diagnosis not present

## 2023-06-13 DIAGNOSIS — F332 Major depressive disorder, recurrent severe without psychotic features: Secondary | ICD-10-CM

## 2023-06-13 MED ORDER — GABAPENTIN 600 MG PO TABS
600.0000 mg | ORAL_TABLET | Freq: Three times a day (TID) | ORAL | 11 refills | Status: DC
Start: 2023-06-13 — End: 2023-12-05

## 2023-06-13 MED ORDER — AZILSARTAN-CHLORTHALIDONE 40-12.5 MG PO TABS: 1.0000 | ORAL_TABLET | Freq: Every day | ORAL | 2 refills | Status: DC

## 2023-06-13 MED ORDER — EMGALITY 120 MG/ML ~~LOC~~ SOAJ
1.0000 | SUBCUTANEOUS | 12 refills | Status: AC
Start: 2023-06-13 — End: ?

## 2023-06-13 NOTE — Telephone Encounter (Signed)
 Pharmacy requesting a 90 day supply  Medication sent to pharmacy.
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
## 2023-06-13 NOTE — Patient Instructions (Addendum)
 [name], it was a pleasure seeing you today! You endorsed feeling well today. Below are some of the things we talked about this visit. We look forward to seeing you in the follow up appointment!  Today we discussed: Your blood pressure is much better controlled. To reduce the amounts of pills you are taking, I am going to change your irbesartan  and chlorthalidone  to a combination pill called azilsartan-chlorthalidone . Take one tablet daily. Check your blood pressure at home.  Your blood pressure medications should be azilsartan-chlorthalidone  40-12.5 mg, spironolactone  50 mg daily, and coreg  25 mg BID. Continue wearing your CPAP.   For your adrenal insufficiency: follow up with endocrinologist. The number is [phone]. It is located in [place].   I have ordered the following labs today:  Lab Orders         BMP8+Anion Gap       Referrals ordered today:   Referral Orders  No referral(s) requested today     I have ordered the following medication/changed the following medications:   Stop the following medications: Medications Discontinued During This Encounter  Medication Reason   gabapentin  (NEURONTIN ) 600 MG tablet Reorder   Galcanezumab -gnlm (EMGALITY ) 120 MG/ML SOAJ Reorder   irbesartan  (AVAPRO ) 150 MG tablet Change in therapy   chlorthalidone  (HYGROTON ) 25 MG tablet Change in therapy     Start the following medications: Meds ordered this encounter  Medications   gabapentin  (NEURONTIN ) 600 MG tablet    Sig: Take 1 tablet (600 mg total) by mouth 3 (three) times daily.    Dispense:  90 tablet    Refill:  11   Galcanezumab -gnlm (EMGALITY ) 120 MG/ML SOAJ    Sig: Inject 1 Application into the skin every 30 (thirty) days.    Dispense:  1.12 mL    Refill:  12   Azilsartan-Chlorthalidone  40-12.5 MG TABS    Sig: Take 1 tablet by mouth daily.    Dispense:  30 tablet    Refill:  2     Follow-up: 4-6 week follow up with PCP   Please make sure to arrive 15 minutes  prior to your next appointment. If you arrive late, you may be asked to reschedule.   We look forward to seeing you next time. Please call our clinic at [phone] if you have any questions or concerns. The best time to call is Monday-Friday from 9am-4pm, but there is someone available 24/7. If after hours or the weekend, call the main hospital number and ask for the Internal Medicine Resident On-Call. If you need medication refills, please notify your pharmacy one week in advance and they will send us  a request.  Thank you for letting us  take part in your care. Wishing you the best!  Thank you, Morene Bathe, MD

## 2023-06-14 DIAGNOSIS — J4540 Moderate persistent asthma, uncomplicated: Secondary | ICD-10-CM | POA: Diagnosis not present

## 2023-06-14 DIAGNOSIS — R0602 Shortness of breath: Secondary | ICD-10-CM | POA: Diagnosis not present

## 2023-06-14 LAB — BMP8+ANION GAP
Anion Gap: 14 mmol/L (ref 10.0–18.0)
BUN/Creatinine Ratio: 14 (ref 9–23)
BUN: 12 mg/dL (ref 6–24)
CO2: 21 mmol/L (ref 20–29)
Calcium: 8.8 mg/dL (ref 8.7–10.2)
Chloride: 107 mmol/L — ABNORMAL HIGH (ref 96–106)
Creatinine, Ser: 0.85 mg/dL (ref 0.57–1.00)
Glucose: 96 mg/dL (ref 70–99)
Potassium: 3.9 mmol/L (ref 3.5–5.2)
Sodium: 142 mmol/L (ref 134–144)
eGFR: 79 mL/min/{1.73_m2} (ref >59)

## 2023-06-15 NOTE — Assessment & Plan Note (Addendum)
 Pt diagnosed with adrenal insufficiency during her hospitalization. Given pt is hypertensive that is controlled on multiple medication, question if she currently has adrenal insufficiency. Will have pt follow up with endocrinology but we can begin the steroid taper for this patient. Given her chronic and low dose steroid, will taper at 2.5 mg every 2 weeks. Detailed instructions with dosages and dates discussed with patient over the phone.

## 2023-06-15 NOTE — Assessment & Plan Note (Signed)
 Continues to wear CPAP around 4-5 hours a night. Encouraged more adherence for better benefits.

## 2023-06-15 NOTE — Assessment & Plan Note (Addendum)
 Well controlled on current regimen. BP at 117/81. To reduce pill burden will combine her pills to include Azilsartan-Chlorthalidone  40-12.5 mg every day. Continue Coreg  at 25 mg BID, and Spironolactone  50 mg every day. Repeat BMP with normal renal function. Pt hypertension does go against adrenal insufficiency as she is on multiple agents, so will plan on tapering her steroids next as they can contribute to her hypertension. She is to follow up with endocrinology but we can initiate a gradual taper for her (see endocrinology section).

## 2023-06-18 NOTE — Progress Notes (Signed)
 Internal Medicine Clinic Attending  Case discussed with the resident at the time of the visit.  We reviewed the resident's history and exam and pertinent patient test results.  I agree with the assessment, diagnosis, and plan of care documented in the resident's note.

## 2023-06-21 ENCOUNTER — Telehealth: Payer: Self-pay | Admitting: *Deleted

## 2023-06-21 NOTE — Telephone Encounter (Signed)
Received a call from [facility] - stated pt has CT Brain on 05/16/23. Report stated  non-emeergent- "Hyper density left side of pituitary gland. Consider pituitary MRI". She stated report was faxed Tuesday (front office did not received) and she will re-fax ; also report is in care everywhere.

## 2023-06-21 NOTE — Telephone Encounter (Signed)
[name] is a 58 year old with past medical history of adrenal insufficiency, hypertension, migraines.  [facility] was contacted as CT head from [facility] ED appointment showed-for density and pituitary gland.  I reviewed report and hyperdensity reported as 4 mm.  I called and talked with patient.  She actually stated she plan to call the clinic today as she had been having some hypotension.  She was not aware of the CT head findings.  I reviewed the hyper density seen in her pituitary gland.  We talked about this not being the best imaging findings and that an MRI may need to be ordered to clarify what they were seeing.  On chart reviewed she was seen in clinic January 8 and blood pressure medications were changed from irbesartan and chlorthalidone to combination pill with azilsartan and chlorthalidone.  I asked that she go back to irbesartan and chlorthalidone and stop combination pill.  She is also taking spironolactone 50 mg and Coreg 25 mg twice daily.  I will message front desk as patient needs follow-up for blood pressure and I would also like to talk more in depth about the pituitary findings to find out if she is having any symptoms.

## 2023-06-26 DIAGNOSIS — M5442 Lumbago with sciatica, left side: Secondary | ICD-10-CM | POA: Diagnosis not present

## 2023-06-26 DIAGNOSIS — E039 Hypothyroidism, unspecified: Secondary | ICD-10-CM | POA: Diagnosis not present

## 2023-06-26 DIAGNOSIS — Z79899 Other long term (current) drug therapy: Secondary | ICD-10-CM | POA: Diagnosis not present

## 2023-06-26 DIAGNOSIS — M5440 Lumbago with sciatica, unspecified side: Secondary | ICD-10-CM | POA: Diagnosis not present

## 2023-06-26 DIAGNOSIS — J45909 Unspecified asthma, uncomplicated: Secondary | ICD-10-CM | POA: Diagnosis not present

## 2023-06-26 DIAGNOSIS — I5030 Unspecified diastolic (congestive) heart failure: Secondary | ICD-10-CM | POA: Diagnosis not present

## 2023-06-26 DIAGNOSIS — K219 Gastro-esophageal reflux disease without esophagitis: Secondary | ICD-10-CM | POA: Diagnosis not present

## 2023-06-26 DIAGNOSIS — M5441 Lumbago with sciatica, right side: Secondary | ICD-10-CM | POA: Diagnosis not present

## 2023-06-26 DIAGNOSIS — G4733 Obstructive sleep apnea (adult) (pediatric): Secondary | ICD-10-CM | POA: Diagnosis not present

## 2023-06-29 ENCOUNTER — Other Ambulatory Visit: Payer: Self-pay | Admitting: Internal Medicine

## 2023-06-29 DIAGNOSIS — E559 Vitamin D deficiency, unspecified: Secondary | ICD-10-CM

## 2023-06-29 NOTE — Telephone Encounter (Signed)
[name] is a 58 year old who is being treated for vitamin D deficiency.  She was taking 50,000 units of vitamin D.  Last vitamin D check was in December 2024 at 13.  Due to refill for her 50,000 unit of vitamin D.  I will switch to 1000 units over-the-counter at this time.  I called and informed patient of this change.

## 2023-07-05 ENCOUNTER — Other Ambulatory Visit: Payer: Self-pay | Admitting: Internal Medicine

## 2023-07-05 ENCOUNTER — Ambulatory Visit (INDEPENDENT_AMBULATORY_CARE_PROVIDER_SITE_OTHER): Payer: 59 | Admitting: Internal Medicine

## 2023-07-05 VITALS — BP 117/71 | HR 91 | Temp 97.5°F | Ht 65.5 in | Wt 218.0 lb

## 2023-07-05 DIAGNOSIS — D352 Benign neoplasm of pituitary gland: Secondary | ICD-10-CM | POA: Diagnosis not present

## 2023-07-05 DIAGNOSIS — I10 Essential (primary) hypertension: Secondary | ICD-10-CM

## 2023-07-05 DIAGNOSIS — E2740 Unspecified adrenocortical insufficiency: Secondary | ICD-10-CM
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
## 2023-07-05 NOTE — Patient Instructions (Signed)
Thank you, Ms.[name] for allowing [place] to provide your care today.   Adrenal insufficiency: Please continue taking hydrocortisone 10 mg in the morning and 5 mg at night. Please be sure to follow-up with endocrinology in April.  Found like your symptoms have been relieved with the current dose of hydrocortisone and I am not changing the dose at this time.  I am ordering an MRI to look at the small lesion on your pituitary gland this could be an explanation for why you have adrenal insufficiency.  I am going to message her neurologist to ask about how to turn off your vagus nerve stimulator.  I have ordered the following medication/changed the following medications:   Stop the following medications: Medications Discontinued During This Encounter  Medication Reason   Azilsartan-Chlorthalidone 40-12.5 MG TABS      Start the following medications: No orders of the defined types were placed in this encounter.    Follow up:  3 months    We look forward to seeing you next time. Please call our clinic at [phone] if you have any questions or concerns. The best time to call is Monday-Friday from 9am-4pm, but there is someone available 24/7. If after hours or the weekend, call the main hospital number and ask for the Internal Medicine Resident On-Call. If you need medication refills, please notify your pharmacy one week in advance and they will send [place] a request.   Thank you for trusting me with your care. Wishing you the best!   Rudene Christians, DO [facility]

## 2023-07-06 NOTE — Assessment & Plan Note (Signed)
At last office visit in January, her ARB and chlorthalidone were switched to a combination pill.  She called the clinic due to hypotension.  I asked her to switch back to old medications.  She did this and also stopped taking irbesartan. Current medications include carvedilol 25 mg twice daily, chlorthalidone 25 mg, Imdur 90 mg twice daily & spironolactone 50 mg daily. Blood pressure well-controlled at 117/71.  She reports that dizziness has resolved. P: Continue carvedilol 25 mg twice daily, chlorthalidone 25 mg, Imdur 90 mg twice daily & spironolactone 50 mg daily.

## 2023-07-06 NOTE — Assessment & Plan Note (Signed)
She was tested for adrenal insufficiency in the fall with her symptom of fatigue.  A.m. cortisol came back at less than 2.  ACTH was inappropriately low normal consistent with central adrenal insufficiency.  She has not followed up with endocrinology but does have an appointment scheduled later this spring.  She had a mechanical fall in December and underwent CT head.  On that head imaging there was a small hyperdensity in her pituitary gland. In terms of her medications he is taking hydrocortisone 10 mg and 5 mg at night.  She does feel like her fatigue symptoms have improved greatly since starting the medication. P: Continue hydrocortisone 10 mg in the a.m. and 5 mg at night MRI brain with without ordered with pituitary protocol, she has a vagus nerve stimulator in place in setting of epilepsy.  I called her neurologist with [facility], Dr. Tera Helper, to clarify about if this could be turned off so that she could have MRI completed.  Addendum:  Prolactin, TSH, IGF-1 future order placed I called and talked with patient, she will plan to come in later this week for a lab only visit.

## 2023-07-09 NOTE — Progress Notes (Signed)
Internal Medicine Clinic Attending  Case discussed with the resident at the time of the visit.  We reviewed the resident's history and exam and pertinent patient test results.  I agree with the assessment, diagnosis, and plan of care documented in the resident's note.  Given concern for central adrenal insufficiency, Dr. Sloan Leiter will plan to assess other pituitary hormones for abnormal production.

## 2023-07-09 NOTE — Addendum Note (Signed)
Addended by: Lucille Passy on: 07/09/2023 05:39 PM   Modules accepted: Orders

## 2023-07-09 NOTE — Addendum Note (Signed)
Addended by: Lucille Passy on: 07/09/2023 09:09 AM   Modules accepted: Orders

## 2023-07-19 DIAGNOSIS — G4733 Obstructive sleep apnea (adult) (pediatric): Secondary | ICD-10-CM | POA: Diagnosis not present

## 2023-07-21 DIAGNOSIS — G4733 Obstructive sleep apnea (adult) (pediatric): Secondary | ICD-10-CM | POA: Diagnosis not present

## 2023-08-02 ENCOUNTER — Other Ambulatory Visit (INDEPENDENT_AMBULATORY_CARE_PROVIDER_SITE_OTHER): Payer: 59

## 2023-08-02 DIAGNOSIS — D352 Benign neoplasm of pituitary gland: Secondary | ICD-10-CM

## 2023-08-04 LAB — INSULIN-LIKE GROWTH FACTOR: Insulin-Like GF-1: 97 ng/mL (ref 60–207)

## 2023-08-04 LAB — TSH: TSH: 1.81 u[IU]/mL (ref 0.450–4.500)

## 2023-08-04 LAB — PROLACTIN: Prolactin: 5.7 ng/mL (ref 3.6–25.2)

## 2023-08-08 ENCOUNTER — Ambulatory Visit: Payer: Self-pay

## 2023-08-08 DIAGNOSIS — M150 Primary generalized (osteo)arthritis: Secondary | ICD-10-CM

## 2023-08-08 DIAGNOSIS — Z789 Other specified health status: Secondary | ICD-10-CM

## 2023-08-08 DIAGNOSIS — I5032 Chronic diastolic (congestive) heart failure: Secondary | ICD-10-CM

## 2023-08-08 DIAGNOSIS — M5416 Radiculopathy, lumbar region: Secondary | ICD-10-CM

## 2023-08-08 NOTE — Patient Outreach (Unsigned)
 Care Coordination   Initial Visit Note   08/09/2023 Name: [name] MRN: [id number] DOB: [birth year]  [name] is a 58 y.o. year old female who sees Masters, Florentina Addison, DO for primary care. I spoke with  [name] by phone today.  What matters to the patients health and wellness today?  I recently had a conversation with [name], who is currently facing several challenges. She has an 42 year old daughter with autism, which adds to her responsibilities. The primary concern expressed by [name] is her congestive heart failure. I informed her that I would be sending her relevant informational materials regarding this condition.  During our discussion, we reviewed the congestive heart failure action plan, and I advised her on the importance of weighing herself on a daily basis. Additionally, I will be providing referrals to assist her in locating suitable housing, as she resides in a townhome and is unable to navigate the stairs due to persistent back pain and leg issues. Furthermore, [name] indicated a need to consult with a professional regarding her depression and her food concerns.     Goals Addressed             This Visit's Progress    Patient Stated- i am concerned about my heart and need to monitor and mange my condition       Patient Goals/Self Care Activities: -Patient/Caregiver will take medications as prescribed   -Patient/Caregiver will attend all scheduled provider appointments -Patient/Caregiver will call pharmacy for medication refills 3-7 days in advance of running out of medications -Patient/Caregiver will call provider office for new concerns or questions  -Patient/Caregiver will focus on medication adherence by taking medications as prescribed  -Weigh daily and record (notify MD with 3 lb weight gain over night or 5 lb in a week) -Follow CHF Action Plan -Adhere to low sodium diet  Wt Readings from Last 3 Encounters:  07/05/23 218 lb (98.9 kg)   06/13/23 226 lb 6.4 oz (102.7 kg)  05/22/23 225 lb 12.8 oz (102.4 kg)             SDOH assessments and interventions completed:  Yes  SDOH Interventions Today    Flowsheet Row Most Recent Value  SDOH Interventions   Food Insecurity Interventions Other (Comment)  [Caare guides]  Housing Interventions Other (Comment)  [Care guides]  Transportation Interventions Intervention Not Indicated  Utilities Interventions Intervention Not Indicated        Care Coordination Interventions:  Yes, provided   Interventions Today    Flowsheet Row Most Recent Value  Chronic Disease   Chronic disease during today's visit Congestive Heart Failure (CHF)  General Interventions   General Interventions Discussed/Reviewed General Interventions Discussed, General Interventions Reviewed  Education Interventions   Education Provided Provided Education  Provided Verbal Education On Nutrition  [Weigh daily-went over CHF action plan]  Pharmacy Interventions   Pharmacy Dicussed/Reviewed Pharmacy Topics Discussed  Safety Interventions   Safety Discussed/Reviewed Safety Discussed        Follow up plan: Follow up call scheduled for 08/22/23  10 am    Encounter Outcome:  Patient Visit Completed   Juanell Fairly RN, BSN, [facility] [place]  [facility], [facility]  Care Coordinator Phone: [phone]

## 2023-08-09 ENCOUNTER — Telehealth: Payer: Self-pay | Admitting: *Deleted

## 2023-08-09 NOTE — Progress Notes (Signed)
 Complex Care Management Care Guide Note  08/09/2023 Name: [name] MRN: [id number] DOB: Jun 04, 1966  [name] is a 58 y.o. year old female who is a primary care patient of Masters, Florentina Addison, DO and is actively engaged with the care management team. I reached out to [name] by phone today to assist with scheduling  with the Licensed Clinical Social Worker.  Follow up plan: Unsuccessful telephone outreach attempt made. A HIPAA compliant phone message was left for the patient providing contact information and requesting a return call.  Gwenevere Ghazi  [facility] Health  Value-Based Care Institute, [facility] Guide  Direct Dial: [phone]  Fax [phone]

## 2023-08-09 NOTE — Patient Instructions (Signed)
 Visit Information  Thank you for taking time to visit with me today. Please don't hesitate to contact me if I can be of assistance to you.   Following are the goals we discussed today:   Goals Addressed             This Visit's Progress    Patient Stated- i am concerned about my heart and need to monitor and mange my condition       Patient Goals/Self Care Activities: -Patient/Caregiver will take medications as prescribed   -Patient/Caregiver will attend all scheduled provider appointments -Patient/Caregiver will call pharmacy for medication refills 3-7 days in advance of running out of medications -Patient/Caregiver will call provider office for new concerns or questions  -Patient/Caregiver will focus on medication adherence by taking medications as prescribed  -Weigh daily and record (notify MD with 3 lb weight gain over night or 5 lb in a week) -Follow CHF Action Plan -Adhere to low sodium diet  Wt Readings from Last 3 Encounters:  07/05/23 218 lb (98.9 kg)  06/13/23 226 lb 6.4 oz (102.7 kg)  05/22/23 225 lb 12.8 oz (102.4 kg)             Our next appointment is by telephone on 08/22/23 at 10 am  Please call the care guide team at [phone] if you need to cancel or reschedule your appointment.   If you are experiencing a Mental Health or Behavioral Health Crisis or need someone to talk to, please call 1-800-273-TALK (toll free, 24 hour hotline)  Patient verbalizes understanding of instructions and care plan provided today and agrees to view in MyChart. Active MyChart status and patient understanding of how to access instructions and care plan via MyChart confirmed with patient.     Abigail Fairly RN, BSN, [facility]  [facility], [facility] Health  Care Coordinator Phone: [phone]

## 2023-08-10 NOTE — Progress Notes (Signed)
 Complex Care Management Care Guide Note  08/10/2023 Name: [name] MRN: [id number] DOB: 25-May-1966  [name] is a 58 y.o. year old female who is a primary care patient of Masters, Florentina Addison, DO and is actively engaged with the care management team. I reached out to [name] by phone today to assist with scheduling  with the Licensed Clinical Social Worker.  Follow up plan: Unsuccessful telephone outreach attempt made. A HIPAA compliant phone message was left for the patient providing contact information and requesting a return call.  Gwenevere Ghazi  [facility] Health  Value-Based Care Institute, [facility] Guide  Direct Dial: [phone]  Fax [phone]

## 2023-08-13 DIAGNOSIS — M5416 Radiculopathy, lumbar region: Secondary | ICD-10-CM | POA: Diagnosis not present

## 2023-08-13 DIAGNOSIS — M4727 Other spondylosis with radiculopathy, lumbosacral region: Secondary | ICD-10-CM | POA: Diagnosis not present

## 2023-08-13 DIAGNOSIS — R293 Abnormal posture: Secondary | ICD-10-CM | POA: Diagnosis not present

## 2023-08-13 DIAGNOSIS — R262 Difficulty in walking, not elsewhere classified: Secondary | ICD-10-CM | POA: Diagnosis not present

## 2023-08-13 DIAGNOSIS — R269 Unspecified abnormalities of gait and mobility: Secondary | ICD-10-CM | POA: Diagnosis not present

## 2023-08-13 DIAGNOSIS — M6281 Muscle weakness (generalized): Secondary | ICD-10-CM | POA: Diagnosis not present

## 2023-08-13 DIAGNOSIS — M5380 Other specified dorsopathies, site unspecified: Secondary | ICD-10-CM | POA: Diagnosis not present

## 2023-08-13 NOTE — Progress Notes (Signed)
 Complex Care Management Note Care Guide Note  08/13/2023 Name: MEGGAN DHALIWAL MRN: [id number] DOB: [birth year]   Complex Care Management Outreach Attempts: A third unsuccessful outreach was attempted today to offer the patient with information about available complex care management services.  Follow Up Plan:  Additional outreach attempts will be made to offer the patient complex care management information and services.   Encounter Outcome:  No Answer  Gwenevere Ghazi  [facility]  [facility], [facility] Guide  Direct Dial: [phone]  Fax [phone]

## 2023-08-15 ENCOUNTER — Ambulatory Visit: Payer: Self-pay | Admitting: Internal Medicine

## 2023-08-15 ENCOUNTER — Encounter: Admitting: Internal Medicine

## 2023-08-15 DIAGNOSIS — D352 Benign neoplasm of pituitary gland: Secondary | ICD-10-CM | POA: Diagnosis not present

## 2023-08-15 DIAGNOSIS — Z9181 History of falling: Secondary | ICD-10-CM | POA: Diagnosis not present

## 2023-08-15 DIAGNOSIS — E2740 Unspecified adrenocortical insufficiency: Secondary | ICD-10-CM | POA: Diagnosis not present

## 2023-08-15 DIAGNOSIS — J4540 Moderate persistent asthma, uncomplicated: Secondary | ICD-10-CM | POA: Diagnosis not present

## 2023-08-15 DIAGNOSIS — I10 Essential (primary) hypertension: Secondary | ICD-10-CM | POA: Diagnosis not present

## 2023-08-15 DIAGNOSIS — R079 Chest pain, unspecified: Secondary | ICD-10-CM | POA: Diagnosis not present

## 2023-08-15 DIAGNOSIS — I252 Old myocardial infarction: Secondary | ICD-10-CM | POA: Diagnosis not present

## 2023-08-15 DIAGNOSIS — I5032 Chronic diastolic (congestive) heart failure: Secondary | ICD-10-CM | POA: Diagnosis not present

## 2023-08-15 DIAGNOSIS — Z9049 Acquired absence of other specified parts of digestive tract: Secondary | ICD-10-CM | POA: Diagnosis not present

## 2023-08-15 DIAGNOSIS — Z886 Allergy status to analgesic agent status: Secondary | ICD-10-CM | POA: Diagnosis not present

## 2023-08-15 DIAGNOSIS — G4733 Obstructive sleep apnea (adult) (pediatric): Secondary | ICD-10-CM | POA: Diagnosis not present

## 2023-08-15 DIAGNOSIS — E038 Other specified hypothyroidism: Secondary | ICD-10-CM | POA: Diagnosis not present

## 2023-08-15 DIAGNOSIS — E039 Hypothyroidism, unspecified: Secondary | ICD-10-CM | POA: Diagnosis not present

## 2023-08-15 DIAGNOSIS — Z91048 Other nonmedicinal substance allergy status: Secondary | ICD-10-CM | POA: Diagnosis not present

## 2023-08-15 DIAGNOSIS — Z9884 Bariatric surgery status: Secondary | ICD-10-CM | POA: Diagnosis not present

## 2023-08-15 DIAGNOSIS — Z79899 Other long term (current) drug therapy: Secondary | ICD-10-CM | POA: Diagnosis not present

## 2023-08-15 DIAGNOSIS — R9431 Abnormal electrocardiogram [ECG] [EKG]: Secondary | ICD-10-CM | POA: Diagnosis not present

## 2023-08-15 DIAGNOSIS — Z888 Allergy status to other drugs, medicaments and biological substances status: Secondary | ICD-10-CM | POA: Diagnosis not present

## 2023-08-15 DIAGNOSIS — Z91040 Latex allergy status: Secondary | ICD-10-CM | POA: Diagnosis not present

## 2023-08-15 DIAGNOSIS — I25111 Atherosclerotic heart disease of native coronary artery with angina pectoris with documented spasm: Secondary | ICD-10-CM | POA: Diagnosis not present

## 2023-08-15 DIAGNOSIS — I201 Angina pectoris with documented spasm: Secondary | ICD-10-CM | POA: Diagnosis not present

## 2023-08-15 DIAGNOSIS — G43909 Migraine, unspecified, not intractable, without status migrainosus: Secondary | ICD-10-CM | POA: Diagnosis not present

## 2023-08-15 DIAGNOSIS — I110 Hypertensive heart disease with heart failure: Secondary | ICD-10-CM | POA: Diagnosis not present

## 2023-08-15 DIAGNOSIS — R197 Diarrhea, unspecified: Secondary | ICD-10-CM | POA: Diagnosis not present

## 2023-08-15 DIAGNOSIS — Z5941 Food insecurity: Secondary | ICD-10-CM | POA: Diagnosis not present

## 2023-08-15 DIAGNOSIS — E876 Hypokalemia: Secondary | ICD-10-CM | POA: Diagnosis not present

## 2023-08-15 DIAGNOSIS — Z881 Allergy status to other antibiotic agents status: Secondary | ICD-10-CM | POA: Diagnosis not present

## 2023-08-15 DIAGNOSIS — K219 Gastro-esophageal reflux disease without esophagitis: Secondary | ICD-10-CM | POA: Diagnosis not present

## 2023-08-15 NOTE — Telephone Encounter (Signed)
 Copied from CRM [id number]. Topic: Clinical - Red Word Triage >> Aug 15, 2023  8:27 AM Prudencio Pair wrote: Red Word that prompted transfer to Nurse Triage: Patient called to cancel appt. States she thinks she will have to go to the ER due because she thinks she's having an asthma crisis & can't get it together.  Chief Complaint: Addisonian crisis Symptoms: Weakness, fatigue, NVD, SOB Frequency: Since the weekend Pertinent Negatives: Patient denies relief Disposition: [x] ED /[] Urgent Care (no appt availability in office) / [] Appointment(In office/virtual)/ []  [facility]/ [] Home Care/ [] Refused Recommended Disposition /[] [place] [facility]/ []  Follow-up with PCP Additional Notes: Patient called in to report symptoms of an Addisonian crisis. Patient reported extreme weakness and fatigue, NVD, SOB and unable to hold urine. Patient sounded short winded while on the phone with this RN. Patient stated this is what she felt like when she has had a crisis in the past. This RN advised ED, per protocol. Patient stated she was going to call a Lyft and head that way. This RN advised patient to call back if anything changes. Patient complied.   Reason for Disposition  Difficulty breathing  Answer Assessment - Initial Assessment Questions 1. DESCRIPTION: "Describe how you are feeling."     Extreme weakness and fatigue 2. SEVERITY: "How bad is it?"  "Can you stand and walk?"   - MILD (0-3): Feels weak or tired, but does not interfere with work, school or normal activities.   - MODERATE (4-7): Able to stand and walk; weakness interferes with work, school, or normal activities.   - SEVERE (8-10): Unable to stand or walk; unable to do usual activities.     States she does not have the strength to even sit-up 3. ONSET: "When did these symptoms begin?" (e.g., hours, days, weeks, months)     Over the weekend 4. CAUSE: "What do you think is causing the weakness or fatigue?" (e.g., not drinking enough  fluids, medical problem, trouble sleeping)     Addisonian crisis  6. OTHER SYMPTOMS: "Do you have any other symptoms?" (e.g., chest pain, fever, cough, SOB, vomiting, diarrhea, bleeding, other areas of pain)     SOB, diarrhea, extreme fatigue and weakness, cannot hold her urine, vomiting  Protocols used: Weakness (Generalized) and Fatigue-A-AH
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
## 2023-08-16 ENCOUNTER — Other Ambulatory Visit: Payer: Self-pay | Admitting: Internal Medicine

## 2023-08-16 DIAGNOSIS — I10 Essential (primary) hypertension: Secondary | ICD-10-CM

## 2023-08-16 NOTE — Telephone Encounter (Signed)
 Medication sent to pharmacy

## 2023-08-20 ENCOUNTER — Telehealth: Payer: Self-pay

## 2023-08-20 NOTE — Transitions of Care (Post Inpatient/ED Visit) (Signed)
   08/20/2023  Name: [name] MRN: [id number] DOB: Jun 24, 1965  Today's TOC FU Call Status: Today's TOC FU Call Status:: Unsuccessful Call (2nd Attempt) Unsuccessful Call (2nd Attempt) Date: 08/20/23  Attempted to reach the patient regarding the most recent Inpatient/ED visit.  Follow Up Plan: Additional outreach attempts will be made to reach the patient to complete the Transitions of Care (Post Inpatient/ED visit) call.   Hilbert Odor RN, CCM [place]  VBCI-Population Health RN Care Manager [phone]

## 2023-08-20 NOTE — Transitions of Care (Post Inpatient/ED Visit) (Signed)
 08/20/2023  Name: [name] MRN: [id number] DOB: 08-18-1965  Today's TOC FU Call Status: Today's TOC FU Call Status:: Successful TOC FU Call Completed TOC FU Call Complete Date: 08/20/23 Patient's Name and Date of Birth confirmed.  Transition Care Management Follow-up Telephone Call How have you been since you were released from the hospital?: Better (Patient states she was experiencing "extreme exhaustion" and went to ED) Any questions or concerns?: No (patient denies questions/concerns)  Items Reviewed: Did you receive and understand the discharge instructions provided?: Yes (patient states she has discharge and has reviewed and states discharge nurse reviewed with her) Medications obtained,verified, and reconciled?: Yes (Medications Reviewed) Any new allergies since your discharge?: No Dietary orders reviewed?: NA Do you have support at home?: Yes Name of Support/Comfort Primary Source: patient states she has CNA through Medicaid 7 days a week  Medications Reviewed Today: Medications Reviewed Today     Reviewed by Jessy Oto, RN (Registered Nurse) on 08/20/23 at 1645  Med List Status: <None>   Medication Order Taking? Sig Documenting Provider Last Dose Status Informant  acetaminophen (TYLENOL) 325 MG tablet [id number] Yes Take 650 mg by mouth every 6 (six) hours as needed for headache.  [provider] Taking Active Self  albuterol (PROVENTIL HFA;VENTOLIN HFA) 108 (90 Base) MCG/ACT inhaler [id number] Yes Inhale 2 puffs into the lungs every 6 (six) hours as needed for wheezing or shortness of breath. Lars Masson, MD Taking Active Self  albuterol (PROVENTIL) (2.5 MG/3ML) 0.083% nebulizer solution [id number] Yes Take 2.5 mg by nebulization every 6 (six) hours as needed for wheezing or shortness of breath. Take 3 mLs (2.5 mg total) by nebulization every 6 (six) hours as needed for Wheezing or Shortness of Breath [provider] Taking Active    carvedilol (COREG) 25 MG tablet [id number] No Take 1 tablet (25 mg total) by mouth 2 (two) times daily with a meal.  Patient not taking: Reported on 08/20/2023   Gwenevere Abbot, MD Not Taking Active            Med Note [facility], [facility] A   Mon Aug 20, 2023  4:21 PM) On hold until seeing PCP 08/22/23  chlorthalidone (HYGROTON) 25 MG tablet [id number] No Take 25 mg by mouth daily.  Patient not taking: Reported on 08/20/2023   [provider] Not Taking Active   cholecalciferol (VITAMIN D3) 25 MCG (1000 UNIT) tablet [id number] No Take 1 tablet (1,000 Units total) by mouth daily.  Patient not taking: Reported on 08/20/2023   Rudene Christians, DO Not Taking Active            Med Note [facility]y Hummingbird, [facility] A   Mon Aug 20, 2023  4:32 PM) Will review with PCP 08/22/23  escitalopram (LEXAPRO) 10 MG tablet [id number] Yes TAKE 1 TABLET BY MOUTH EVERY DAY Masters, [place], DO Taking Active            Med Note [place], Suzette Flagler A   Mon Aug 20, 2023  4:36 PM) Patient states she takes this and only takes amitriptyline for pain as needed- will discuss with PCP at 08/22/23  famotidine (PEPCID) 20 MG tablet [id number] No Take 20 mg by mouth 2 (two) times daily.  Patient not taking: Reported on 08/20/2023   [provider] Not Taking Active Self           Med Note [facility], Domanic Matusek A   Mon Aug 20, 2023  4:38 PM) Will discuss with PCP - not on d/c papers  fluticasone (FLONASE) 50 MCG/ACT nasal spray [id number] Yes Place 2 sprays into both nostrils daily. [provider] Taking Active   furosemide (LASIX) 20 MG tablet [id number] No TAKE 1 TABLET (20 MG TOTAL) BY MOUTH DAILY AS NEEDED FOR EDEMA.  Patient not taking: Reported on 08/20/2023   Sande Rives, MD Not Taking Active            Med Note Morey Hummingbird, [facility] A   Mon Aug 20, 2023  4:22 PM) On hold until PCP appointment 08/22/23  gabapentin (NEURONTIN) 600 MG tablet [id number] Yes Take 1 tablet (600 mg total) by mouth 3 (three) times daily.  Patient  taking differently: Take 600 mg by mouth 2 (two) times daily. Take 2 capsules in AM - 1 capsule at bedtime for 1 week, then 2 capsules at bedtime   Gwenevere Abbot, MD Taking Active   Galcanezumab-gnlm [facility]) 120 MG/ML Ivory Broad [id number] Yes Inject 1 Application into the skin every 30 (thirty) days. Gwenevere Abbot, MD Taking Active   hydrocortisone (CORTEF) 5 MG tablet [id number] Yes TAKE 2 TABLETS (10 MG TOTAL) BY MOUTH IN THE MORNING AND 1 TABLET (5 MG TOTAL) DAILY IN THE EVENING.  Patient taking differently: Take 30mg  in the morning, 15mg  in the evening 3/14-3/15/25; THEN take 20mg  in the morning , 10mg  in evening 3/16 - 08/21/23; THEN resume your home dosing of 10mg  in the morning 5mg  in the evening   Masters, Katie, DO Taking Active Self  isosorbide mononitrate (IMDUR) 30 MG 24 hr tablet [id number] Yes Take 3 tablets (90 mg total) by mouth 2 (two) times daily. Meriam Sprague, MD Taking Active Self  levothyroxine (SYNTHROID) 50 MCG tablet [id number] Yes TAKE 1 TABLET BY MOUTH EVERY DAY Masters, Katie, DO Taking Active   lidocaine-prilocaine (EMLA) cream [id number] No APPLY TOPICALLY 1 APPLICATION AS NEEDED  Patient not taking: Reported on 08/20/2023   Rudene Christians, DO Not Taking Active            Med Note [facility], Kelechi Orgeron A   Mon Aug 20, 2023  4:40 PM) Will not use until PCP 08/22/23 per d/c discontinued  methocarbamol (ROBAXIN) 750 MG tablet [id number] Yes Take 750 mg by mouth every 8 (eight) hours as needed for muscle spasms. [provider] Taking Active Self  nitroGLYCERIN (NITROSTAT) 0.4 MG SL tablet [id number] Yes PLACE 1 TAB UNDER TONGUE EVERY 5 MINUTES AS NEEDED FOR CHEST PAIN, MAX 3/15 MINS Gaston Islam., NP Taking Active   SAVELLA 12.5 MG TABS [id number] No Take 12.5 mg by mouth in the morning and at bedtime.  Patient not taking: Reported on 08/20/2023   [provider] Not Taking Active Self           Med Note [facility] Hummingbird, Isacc Turney A   Mon Aug 20, 2023  4:42 PM) Patient  will discuss with PCP -not on d/c papers  spironolactone (ALDACTONE) 50 MG tablet [id number] No TAKE 1 TABLET BY MOUTH EVERY DAY  Patient not taking: Reported on 08/20/2023   Rudene Christians, DO Not Taking Active            Med Note [facility], Airyn Ellzey A   Mon Aug 20, 2023  4:26 PM) Hold until seeing PCP 08/22/23  topiramate (TOPAMAX) 200 MG tablet [id number] Yes Take 1 tablet (200 mg total) by mouth 2 (two) times daily. Levert Feinstein, MD Taking Active Self            Home Care and Equipment/Supplies: Were Home Health Services Ordered?: Yes Name  of Home Health Agency:: [facility] Has Agency set up a time to come to your home?: Yes First Home Health Visit Date: 08/23/23 Any new equipment or medical supplies ordered?: No  Functional Questionnaire: Do you need assistance with bathing/showering or dressing?: Yes (CNA assists) Do you need assistance with meal preparation?: Yes (CNA prepares meals) Do you need assistance with eating?: No Do you have difficulty maintaining continence: Yes (occasional bladder incontinence) Do you need assistance with getting out of bed/getting out of a chair/moving?: No Do you have difficulty managing or taking your medications?: No  Follow up appointments reviewed: PCP Follow-up appointment confirmed?: Yes Date of PCP follow-up appointment?: 08/22/23 Follow-up Provider: Rudene Christians, DO Specialist Hospital Follow-up appointment confirmed?: Yes Date of Specialist follow-up appointment?: 08/22/23 Follow-Up Specialty Provider:: Juanell Fairly Do you need transportation to your follow-up appointment?: No (Patient states she uses [facility] from [facility]) Do you understand care options if your condition(s) worsen?: Yes-patient verbalized understanding  SDOH Interventions Today    Flowsheet Row Most Recent Value  SDOH Interventions   Food Insecurity Interventions Intervention Not Indicated  Housing Interventions Intervention Not Indicated  Transportation  Interventions Intervention Not Indicated  [patient gets rides through [place] paid by Medicaid]  Utilities Interventions Intervention Not Indicated       Goals Addressed               This Visit's Progress     TOC Care Plan - Patient will report no readmissions in the next 30 days (pt-stated)        Current Barriers:  Medication management TOC RN and patient reviewed at length the medication list. Patient has meds on hold pending PCP appointment 08/22/23  RNCM Clinical Goal(s):  Patient will work with the Care Management team over the next 30 days to address Transition of Care Barriers: Medication Management Patient will take all medications exactly as prescribed and will take medications and discharge papers to PCP appointment for review Patient will demonstrate understanding of rationale for each prescribed medication as evidenced by patient report Patient will attend all scheduled medical appointments: PCP appointment 08/22/23 and CHF telephone appointment 08/22/23  Interventions: Evaluation of current treatment plan related to  self management and patient's adherence to plan as established by provider  Transitions of Care:  New goal. Doctor Visits  - discussed the importance of doctor visits Communication with PCP re: medication changes and medications that are currently being held  Heart Failure Interventions:  (Status:  New goal.) Short Term Goal Provided education about placing scale on hard, flat surface Advised patient to weigh each morning after emptying bladder Discussed importance of daily weight and advised patient to weigh and record daily  Patient Goals/Self-Care Activities: Participate in Transition of Care Program/Attend [facility] scheduled calls Notify RN Care Manager of [facility] call rescheduling needs Take all medications as prescribed - take medication bottles and discharge papers to PCP appointment 08/22/23 Attend all scheduled provider appointments Weigh daily and report  2lb weight gain overnight or 5lbs weight gain in a week Notify MD or RN with return of weakness, fatigue, any difficulty breathing, worsening pain or new issues/concerns   Follow Up Plan:  Telephone follow up appointment with care management team member scheduled for:  08/28/23 3pm The patient has been provided with contact information for the care management team and has been advised to call with any health related questions or concerns.          Hilbert Odor RN, CCM [place]  VBCI-Population Health RN Care Manager [phone]

## 2023-08-20 NOTE — Transitions of Care (Post Inpatient/ED Visit) (Signed)
   08/20/2023  Name: AMORA SHEEHY MRN: [id number] DOB: Jun 02, 1966  Today's TOC FU Call Status: Today's TOC FU Call Status:: Unsuccessful Call (1st Attempt) Unsuccessful Call (1st Attempt) Date: 08/20/23  Attempted to reach the patient regarding the most recent Inpatient/ED visit.  Follow Up Plan: Additional outreach attempts will be made to reach the patient to complete the Transitions of Care (Post Inpatient/ED visit) call.   Hilbert Odor RN, CCM [place]  VBCI-Population Health RN Care Manager [phone]

## 2023-08-22 ENCOUNTER — Ambulatory Visit: Payer: Self-pay | Admitting: Internal Medicine

## 2023-08-22 ENCOUNTER — Ambulatory Visit: Payer: Self-pay

## 2023-08-22 VITALS — BP 135/85 | HR 89 | Temp 98.5°F | Ht 65.5 in | Wt 219.0 lb

## 2023-08-22 DIAGNOSIS — I5032 Chronic diastolic (congestive) heart failure: Secondary | ICD-10-CM | POA: Diagnosis not present

## 2023-08-22 DIAGNOSIS — F4329 Adjustment disorder with other symptoms: Secondary | ICD-10-CM

## 2023-08-22 DIAGNOSIS — E2740 Unspecified adrenocortical insufficiency: Secondary | ICD-10-CM | POA: Diagnosis not present

## 2023-08-22 DIAGNOSIS — I201 Angina pectoris with documented spasm: Secondary | ICD-10-CM

## 2023-08-22 MED ORDER — HYDROCORTISONE 5 MG PO TABS: ORAL_TABLET | ORAL | 11 refills | Status: AC

## 2023-08-22 NOTE — Assessment & Plan Note (Signed)
 She was admitted with acute adrenal insufficiency in setting of viral illness.  Endocrinology was consulted during hospital stay and she was stress dosed.  She completed stress dosing and is back on Cortef 10 mg in the morning and 5 mg in the evening.  She continues to have increased fatigue feels like she has difficulty standing up at times due to symptoms.  She was able to get appointment with endocrinology moved up to 3/25. Plan: Increase Cortef from 10 in a.m and 5 in p.m. to 12.5 mg in the morning and 7.5 mg in the evening Follow-up with endocrinology next week to establish care
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
## 2023-08-22 NOTE — Assessment & Plan Note (Signed)
 History of coronary vasopressors.  She request referral to cardiology closer to where she lives in [place].  Was recently taken off of beta-blocker and Imdur in the setting of hypotension from adrenal insufficiency. P: Referral placed for cardiology

## 2023-08-22 NOTE — Patient Outreach (Signed)
 Care Coordination   Follow Up Visit Note   08/22/2023 Name: TEMPRENCE RHINES MRN: [id number] DOB: Aug 15, 1965  [name] is a 58 y.o. year old female who sees Masters, Florentina Addison, DO for primary care. I spoke with  [name] by phone today.  What matters to the patients health and wellness today?  I had a conversation with Mrs. Dorsainvil today, during which she provided an update regarding her recent hospital stay. She was admitted to the hospital on March 12th and discharged on March 14th. Her medical concerns included unspecified hypothyroidism, chest pain, and generalized weakness. Fortunately, she reported an improvement in her condition.  While hospitalized, Mrs. Hokenson received educational information pertinent to her health and underwent modifications to her medication regimen. Today, she expressed relief about her appointment with her physician, as she had organized all of her medications to bring along. I emphasized the importance of ensuring she retained necessary medications while properly disposing of those that are no longer required to avoid any potential confusion.  Additionally, Mrs. Nonaka indicated that she has begun weighing herself every morning and intends to start monitoring her blood pressure. At present, her weight is 216 pounds. Furthermore, she has a follow-up appointment scheduled with her endocrinologist on March 27th at 10:30 AM.      Goals Addressed             This Visit's Progress    Patient Stated- i am concerned about my heart and need to monitor and mange my condition       Patient Goals/Self Care Activities: -Patient/Caregiver will take medications as prescribed   -Patient/Caregiver will attend all scheduled provider appointments -Patient/Caregiver will call pharmacy for medication refills 3-7 days in advance of running out of medications -Patient/Caregiver will call provider office for new concerns or questions  -Patient/Caregiver will focus on medication  adherence by taking medications as prescribed  -Weigh daily and record (notify MD with 3 lb weight gain over night or 5 lb in a week) -Follow CHF Action Plan -Adhere to low sodium diet  Wt Readings from Last 3 Encounters:  08/22/23 219 lb (99.3 kg)  07/05/23 218 lb (98.9 kg)  06/13/23 226 lb 6.4 oz (102.7 kg)   BP Readings from Last 3 Encounters:  08/22/23 135/85  07/05/23 117/71  06/13/23 117/81               SDOH assessments and interventions completed:  No     Care Coordination Interventions:  Yes, provided   Interventions Today    Flowsheet Row Most Recent Value  Chronic Disease   Chronic disease during today's visit Congestive Heart Failure (CHF)  General Interventions   General Interventions Discussed/Reviewed General Interventions Discussed, General Interventions Reviewed  Education Interventions   Provided Verbal Education On Nutrition  Nutrition Interventions   Nutrition Discussed/Reviewed Nutrition Discussed  Pharmacy Interventions   Pharmacy Dicussed/Reviewed Pharmacy Topics Discussed, Pharmacy Topics Reviewed  Safety Interventions   Safety Discussed/Reviewed Safety Discussed        Follow up plan: Follow up call scheduled for 08/30/23  1030 am    Encounter Outcome:  Patient Visit Completed   Juanell Fairly RN, BSN, [facility] [place]  [facility], [facility]  Care Coordinator Phone: [phone]

## 2023-08-22 NOTE — Assessment & Plan Note (Signed)
 Her blood pressure medications were held at discharge in the setting of hypotension from acute adrenal insufficiency.  Today her blood pressure is at 135/85.  She continues to have significant fatigue.  She is checking her blood pressures at home and notes that systolics are typically in the 120s. Plan: Discontinue carvedilol, chlorthalidone, Lasix, and spironolactone She is also not been taking Imdur recently, denies chest discomfort and her blood pressures are well-controlled.  She has Imdur at home and I instructed her to continue to hold this medication

## 2023-08-22 NOTE — Patient Instructions (Signed)
 Visit Information  Thank you for taking time to visit with me today. Please don't hesitate to contact me if I can be of assistance to you.   Following are the goals we discussed today:   Goals Addressed             This Visit's Progress    Patient Stated- i am concerned about my heart and need to monitor and mange my condition       Patient Goals/Self Care Activities: -Patient/Caregiver will take medications as prescribed   -Patient/Caregiver will attend all scheduled provider appointments -Patient/Caregiver will call pharmacy for medication refills 3-7 days in advance of running out of medications -Patient/Caregiver will call provider office for new concerns or questions  -Patient/Caregiver will focus on medication adherence by taking medications as prescribed  -Weigh daily and record (notify MD with 3 lb weight gain over night or 5 lb in a week) -Follow CHF Action Plan -Adhere to low sodium diet  Wt Readings from Last 3 Encounters:  08/22/23 219 lb (99.3 kg)  07/05/23 218 lb (98.9 kg)  06/13/23 226 lb 6.4 oz (102.7 kg)   BP Readings from Last 3 Encounters:  08/22/23 135/85  07/05/23 117/71  06/13/23 117/81               Our next appointment is by telephone on 08/30/23 at 1030 am  Please call the care guide team at [phone] if you need to cancel or reschedule your appointment.   If you are experiencing a Mental Health or Behavioral Health Crisis or need someone to talk to, please call 1-800-273-TALK (toll free, 24 hour hotline)  Patient verbalizes understanding of instructions and care plan provided today and agrees to view in MyChart. Active MyChart status and patient understanding of how to access instructions and care plan via MyChart confirmed with patient.     Juanell Fairly RN, BSN, [facility]  [facility], [facility]  Care Coordinator Phone: [phone]

## 2023-08-22 NOTE — Patient Instructions (Signed)
 Thank you, Ms.[name] for allowing [place] to provide your care today.   Adrenal insufficiency I am increasing the dose of cortef to 12.5 mg in am and 7.5 mg in evening. Please go to endocrine next week, I am glad you are able to see them.  I have referred you to Christen Butter for community resources and counseling.  Referrals ordered today:   Referral Orders         Ambulatory referral to Integrated Behavioral Health      I have ordered the following medication/changed the following medications:   Stop the following medications: Medications Discontinued During This Encounter  Medication Reason   carvedilol (COREG) 25 MG tablet    chlorthalidone (HYGROTON) 25 MG tablet    spironolactone (ALDACTONE) 50 MG tablet    hydrocortisone (CORTEF) 5 MG tablet      Start the following medications: Meds ordered this encounter  Medications   hydrocortisone (CORTEF) 5 MG tablet    Sig: Take 2.5 tablets (12.5 mg total) by mouth in the morning AND 1.5 tablets (7.5 mg total) every evening.    Dispense:  90 tablet    Refill:  11     Follow up: 3 months   We look forward to seeing you next time. Please call our clinic at [phone] if you have any questions or concerns. The best time to call is Monday-Friday from 9am-4pm, but there is someone available 24/7. If after hours or the weekend, call the main hospital number and ask for the Internal Medicine Resident On-Call. If you need medication refills, please notify your pharmacy one week in advance and they will send [place] a request.   Thank you for trusting me with your care. Wishing you the best!   Rudene Christians, DO [facility]

## 2023-08-22 NOTE — Assessment & Plan Note (Signed)
 Her older daughter was in the ICU after having a baby a couple weeks ago.  Her daughter is now in cardiac rehab but remains in hospital.  Patient has been helping take care of the newborn.  Her younger daughter is 60 years old and has autism with some behavioral difficulties.  Jama Flavors is trying to find resources to help her 20 year old daughter as she is no longer able to go to the day center that she was attending. P: Referral placed for social work and therapy support

## 2023-08-23 ENCOUNTER — Telehealth: Payer: Self-pay | Admitting: *Deleted

## 2023-08-23 DIAGNOSIS — I2510 Atherosclerotic heart disease of native coronary artery without angina pectoris: Secondary | ICD-10-CM | POA: Diagnosis not present

## 2023-08-23 DIAGNOSIS — Z79899 Other long term (current) drug therapy: Secondary | ICD-10-CM | POA: Diagnosis not present

## 2023-08-23 DIAGNOSIS — G4733 Obstructive sleep apnea (adult) (pediatric): Secondary | ICD-10-CM | POA: Diagnosis not present

## 2023-08-23 DIAGNOSIS — M5416 Radiculopathy, lumbar region: Secondary | ICD-10-CM | POA: Diagnosis not present

## 2023-08-23 DIAGNOSIS — K219 Gastro-esophageal reflux disease without esophagitis: Secondary | ICD-10-CM | POA: Diagnosis not present

## 2023-08-23 DIAGNOSIS — I110 Hypertensive heart disease with heart failure: Secondary | ICD-10-CM | POA: Diagnosis not present

## 2023-08-23 DIAGNOSIS — M797 Fibromyalgia: Secondary | ICD-10-CM | POA: Diagnosis not present

## 2023-08-23 DIAGNOSIS — E2740 Unspecified adrenocortical insufficiency: Secondary | ICD-10-CM | POA: Diagnosis not present

## 2023-08-23 DIAGNOSIS — D352 Benign neoplasm of pituitary gland: Secondary | ICD-10-CM | POA: Diagnosis not present

## 2023-08-23 DIAGNOSIS — Z9682 Presence of neurostimulator: Secondary | ICD-10-CM | POA: Diagnosis not present

## 2023-08-23 DIAGNOSIS — J4540 Moderate persistent asthma, uncomplicated: Secondary | ICD-10-CM | POA: Diagnosis not present

## 2023-08-23 DIAGNOSIS — E039 Hypothyroidism, unspecified: Secondary | ICD-10-CM | POA: Diagnosis not present

## 2023-08-23 DIAGNOSIS — I5032 Chronic diastolic (congestive) heart failure: Secondary | ICD-10-CM | POA: Diagnosis not present

## 2023-08-23 DIAGNOSIS — M47816 Spondylosis without myelopathy or radiculopathy, lumbar region: Secondary | ICD-10-CM | POA: Diagnosis not present

## 2023-08-23 NOTE — Progress Notes (Unsigned)
 Complex Care Management Care Guide Note  08/23/2023 Name: [name] MRN: [id number] DOB: 11-21-1965  [name] is a 58 y.o. year old female who is a primary care patient of Masters, Florentina Addison, DO and is actively engaged with the care management team. I reached out to [name] by phone today to assist with scheduling  with the Licensed Clinical Social Worker.  Follow up plan: Unsuccessful telephone outreach attempt made. A HIPAA compliant phone message was left for the patient providing contact information and requesting a return call.  Gwenevere Ghazi  [facility]  Value-Based Care Institute, [facility] Guide  Direct Dial: [phone]  Fax [phone]

## 2023-08-24 NOTE — Progress Notes (Signed)
 Complex Care Management Care Guide Note  08/24/2023 Name: SHELA ESSES MRN: [id number] DOB: [birth year]  [name] is a 58 y.o. year old female who is a primary care patient of Masters, Florentina Addison, DO and is actively engaged with the care management team. I reached out to [name] by phone today to assist with scheduling  with the Licensed Clinical Social Worker.  Follow up plan: Telephone appointment with complex care management team member scheduled for:  09/11/23  Gwenevere Ghazi  [facility]  Value-Based Care Institute, [facility] Guide  Direct Dial: [phone]  Fax [phone]

## 2023-08-27 ENCOUNTER — Telehealth: Payer: Self-pay | Admitting: *Deleted

## 2023-08-27 NOTE — Telephone Encounter (Signed)
 RTC to Christa, PT with [facility].  Requesting Verbal orders for Home PT 2 times a week for 6 weeks. Will be working on Development worker, international aid, Heritage manager, Personal assistant , Journalist, newspaper for patient. Verbal approval was given.  Will forward to PCP for approval or denial of approved orders.

## 2023-08-27 NOTE — Telephone Encounter (Signed)
 Copied from CRM [id number]. Topic: Clinical - Home Health Verbal Orders >> Aug 24, 2023  4:15 PM Tiffany H wrote: Caller/Agency: Christa with [facility] Callback Number: [phone] Service Requested: Physical Therapy Frequency: 2x weekly for 6 weeks, 1x a week for 2 weeks.  Any new concerns about the patient? No

## 2023-08-28 ENCOUNTER — Telehealth: Payer: Self-pay

## 2023-08-28 ENCOUNTER — Other Ambulatory Visit

## 2023-08-28 DIAGNOSIS — Z9682 Presence of neurostimulator: Secondary | ICD-10-CM | POA: Diagnosis not present

## 2023-08-28 DIAGNOSIS — M47816 Spondylosis without myelopathy or radiculopathy, lumbar region: Secondary | ICD-10-CM | POA: Diagnosis not present

## 2023-08-28 DIAGNOSIS — Z79899 Other long term (current) drug therapy: Secondary | ICD-10-CM | POA: Diagnosis not present

## 2023-08-28 DIAGNOSIS — E039 Hypothyroidism, unspecified: Secondary | ICD-10-CM | POA: Diagnosis not present

## 2023-08-28 DIAGNOSIS — I110 Hypertensive heart disease with heart failure: Secondary | ICD-10-CM | POA: Diagnosis not present

## 2023-08-28 DIAGNOSIS — I5032 Chronic diastolic (congestive) heart failure: Secondary | ICD-10-CM | POA: Diagnosis not present

## 2023-08-28 DIAGNOSIS — D352 Benign neoplasm of pituitary gland: Secondary | ICD-10-CM | POA: Diagnosis not present

## 2023-08-28 DIAGNOSIS — J4540 Moderate persistent asthma, uncomplicated: Secondary | ICD-10-CM | POA: Diagnosis not present

## 2023-08-28 DIAGNOSIS — M5416 Radiculopathy, lumbar region: Secondary | ICD-10-CM | POA: Diagnosis not present

## 2023-08-28 DIAGNOSIS — I2510 Atherosclerotic heart disease of native coronary artery without angina pectoris: Secondary | ICD-10-CM | POA: Diagnosis not present

## 2023-08-28 DIAGNOSIS — K219 Gastro-esophageal reflux disease without esophagitis: Secondary | ICD-10-CM | POA: Diagnosis not present

## 2023-08-28 DIAGNOSIS — E2740 Unspecified adrenocortical insufficiency: Secondary | ICD-10-CM | POA: Diagnosis not present

## 2023-08-28 DIAGNOSIS — M797 Fibromyalgia: Secondary | ICD-10-CM | POA: Diagnosis not present

## 2023-08-28 DIAGNOSIS — G4733 Obstructive sleep apnea (adult) (pediatric): Secondary | ICD-10-CM | POA: Diagnosis not present

## 2023-08-28 NOTE — Patient Outreach (Signed)
 Care Management  Transitions of Care Program Transitions of Care Post-discharge week 2  08/28/2023 Name: [name] MRN: [id number] DOB: 1965-09-20  Subjective: [name] is a 58 y.o. year old female who is a primary care patient of Masters, Florentina Addison, DO. The Care Management team spoke with patient by telephone to assess and address transitions of care needs. Patient requested to reschedule our call to 08/31/23 at 8:30am  Plan: Additional outreach attempts will be made to reach the patient enrolled in the [facility] Program (Post Inpatient/ED Visit).  Hilbert Odor RN, CCM [place]  VBCI-Population Health RN Care Manager [phone]

## 2023-08-30 ENCOUNTER — Telehealth: Payer: Self-pay

## 2023-08-30 ENCOUNTER — Encounter

## 2023-08-30 DIAGNOSIS — M797 Fibromyalgia: Secondary | ICD-10-CM | POA: Diagnosis not present

## 2023-08-30 DIAGNOSIS — D352 Benign neoplasm of pituitary gland: Secondary | ICD-10-CM | POA: Diagnosis not present

## 2023-08-30 DIAGNOSIS — I5032 Chronic diastolic (congestive) heart failure: Secondary | ICD-10-CM | POA: Diagnosis not present

## 2023-08-30 DIAGNOSIS — K219 Gastro-esophageal reflux disease without esophagitis: Secondary | ICD-10-CM | POA: Diagnosis not present

## 2023-08-30 DIAGNOSIS — Z79899 Other long term (current) drug therapy: Secondary | ICD-10-CM | POA: Diagnosis not present

## 2023-08-30 DIAGNOSIS — M5416 Radiculopathy, lumbar region: Secondary | ICD-10-CM | POA: Diagnosis not present

## 2023-08-30 DIAGNOSIS — G4733 Obstructive sleep apnea (adult) (pediatric): Secondary | ICD-10-CM | POA: Diagnosis not present

## 2023-08-30 DIAGNOSIS — M47816 Spondylosis without myelopathy or radiculopathy, lumbar region: Secondary | ICD-10-CM | POA: Diagnosis not present

## 2023-08-30 DIAGNOSIS — J4540 Moderate persistent asthma, uncomplicated: Secondary | ICD-10-CM | POA: Diagnosis not present

## 2023-08-30 DIAGNOSIS — Z9682 Presence of neurostimulator: Secondary | ICD-10-CM | POA: Diagnosis not present

## 2023-08-30 DIAGNOSIS — E039 Hypothyroidism, unspecified: Secondary | ICD-10-CM | POA: Diagnosis not present

## 2023-08-30 DIAGNOSIS — I110 Hypertensive heart disease with heart failure: Secondary | ICD-10-CM | POA: Diagnosis not present

## 2023-08-30 DIAGNOSIS — E2740 Unspecified adrenocortical insufficiency: Secondary | ICD-10-CM | POA: Diagnosis not present

## 2023-08-30 DIAGNOSIS — I2510 Atherosclerotic heart disease of native coronary artery without angina pectoris: Secondary | ICD-10-CM | POA: Diagnosis not present

## 2023-08-30 NOTE — Patient Outreach (Signed)
 Care Coordination   08/30/2023 Name: [name] MRN: [id number] DOB: 25-Feb-1966   Care Coordination Outreach Attempts:  An unsuccessful telephone outreach was attempted today to offer the patient information about available complex care management services.  Follow Up Plan:  Additional outreach attempts will be made to offer the patient complex care management information and services.   Encounter Outcome:  No Answer   Care Coordination Interventions:  No, not indicated    SIG Juanell Fairly RN, BSN, [facility]  [facility], [facility] Health  Care Coordinator Phone: [phone]

## 2023-08-31 ENCOUNTER — Telehealth: Payer: Self-pay

## 2023-08-31 ENCOUNTER — Other Ambulatory Visit: Payer: Self-pay

## 2023-08-31 NOTE — Patient Instructions (Signed)
 Visit Information  Thank you for taking time to visit with me today. Please don't hesitate to contact me if I can be of assistance to you before our next scheduled telephone appointment.  Our next appointment is by telephone on 09/07/23 at 8:30am  Following is a copy of your care plan:   Goals Addressed               This Visit's Progress     TOC Care Plan - Patient will report no readmissions in the next 30 days (pt-stated)        Current Barriers:  Medication management TOC RN and patient reviewed at length the medication list. Patient has meds on hold pending PCP appointment 08/22/23 (08/31/23 patient reports she reviewed medications with PCP and list was updated)  RNCM Clinical Goal(s): (Ongoing for 30 day TOC Program Patient will work with the Care Management team over the next 30 days to address Transition of Care Barriers: Medication Management Patient will take all medications exactly as prescribed and will take medications and discharge papers to PCP appointment for review Patient will demonstrate understanding of rationale for each prescribed medication as evidenced by patient report Patient will attend all scheduled medical appointments: PCP appointment 08/22/23 and CHF telephone appointment 08/22/23 - MET  Interventions: Evaluation of current treatment plan related to  self management and patient's adherence to plan as established by provider  Transitions of Care:  Goal on track:  Yes. Doctor Visits  - discussed the importance of doctor visits Communication with PCP re: medication changes and medications that are currently being held - (08/31/23 patient reports she reviewed with PCP and list is updated) Discussed caregiver burden and encouraged patient to take time each day for herself and to listen to her body  Heart Failure Interventions:  (Status:  Goal on track:  Yes.) Short Term Goal Basic overview and discussion of pathophysiology of Heart Failure reviewed Provided  education on low sodium diet Provided education about placing scale on hard, flat surface Advised patient to weigh each morning after emptying bladder Discussed importance of daily weight and advised patient to weigh and record daily  Patient Goals/Self-Care Activities: Participate in Transition of Care Program/Attend [facility] scheduled calls Notify RN Care Manager of TOC call rescheduling needs Take all medications as prescribed - take medication bottles and discharge papers to PCP appointment 08/22/23 (08/31/23 patient reports seeing PCP and reviewing meds and list updated) Attend all scheduled provider appointments Weigh daily and report 2lb weight gain overnight or 5lbs weight gain in a week Notify MD or RN with return of weakness, fatigue, any difficulty breathing, worsening pain or new issues/concerns  Patient will find time each day for self care  Patient will work with SW and find therapist   Follow Up Plan:  Telephone follow up appointment with care management team member scheduled for:  09/07/23 8:30am The patient has been provided with contact information for the care management team and has been advised to call with any health related questions or concerns.          Patient verbalizes understanding of instructions and care plan provided today and agrees to view in MyChart. Active MyChart status and patient understanding of how to access instructions and care plan via MyChart confirmed with patient.     Telephone follow up appointment with care management team member scheduled for:09/07/23 8:30am The patient has been provided with contact information for the care management team and has been advised to call with any health  related questions or concerns.   Please call the care guide team at [phone] if you need to cancel or reschedule your appointment.   Please call the Suicide and Crisis Lifeline: 988 call the [place] National Suicide Prevention Lifeline: 530-096-9176 or TTY:  (940) 595-8369 TTY 419-808-4036) to talk to a trained counselor call 1-800-273-TALK (toll free, 24 hour hotline) call 911 if you are experiencing a Mental Health or Behavioral Health Crisis or need someone to talk to.  Hilbert Odor RN, CCM [place]  VBCI-Population Health RN Care Manager [phone]
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
## 2023-08-31 NOTE — Progress Notes (Signed)
 Internal Medicine Clinic Attending  Case discussed with the resident at the time of the visit.  We reviewed the resident's history and exam and pertinent patient test results.  I agree with the assessment, diagnosis, and plan of care documented in the resident's note.

## 2023-09-02 ENCOUNTER — Other Ambulatory Visit: Payer: Self-pay | Admitting: Cardiovascular Disease

## 2023-09-04 DIAGNOSIS — D352 Benign neoplasm of pituitary gland: Secondary | ICD-10-CM | POA: Diagnosis not present

## 2023-09-04 DIAGNOSIS — I110 Hypertensive heart disease with heart failure: Secondary | ICD-10-CM | POA: Diagnosis not present

## 2023-09-04 DIAGNOSIS — M797 Fibromyalgia: Secondary | ICD-10-CM | POA: Diagnosis not present

## 2023-09-04 DIAGNOSIS — K219 Gastro-esophageal reflux disease without esophagitis: Secondary | ICD-10-CM | POA: Diagnosis not present

## 2023-09-04 DIAGNOSIS — G4733 Obstructive sleep apnea (adult) (pediatric): Secondary | ICD-10-CM | POA: Diagnosis not present

## 2023-09-04 DIAGNOSIS — I5032 Chronic diastolic (congestive) heart failure: Secondary | ICD-10-CM | POA: Diagnosis not present

## 2023-09-04 DIAGNOSIS — M47816 Spondylosis without myelopathy or radiculopathy, lumbar region: Secondary | ICD-10-CM | POA: Diagnosis not present

## 2023-09-04 DIAGNOSIS — J4540 Moderate persistent asthma, uncomplicated: Secondary | ICD-10-CM | POA: Diagnosis not present

## 2023-09-04 DIAGNOSIS — I2510 Atherosclerotic heart disease of native coronary artery without angina pectoris: Secondary | ICD-10-CM | POA: Diagnosis not present

## 2023-09-04 DIAGNOSIS — Z79899 Other long term (current) drug therapy: Secondary | ICD-10-CM | POA: Diagnosis not present

## 2023-09-04 DIAGNOSIS — Z9682 Presence of neurostimulator: Secondary | ICD-10-CM | POA: Diagnosis not present

## 2023-09-04 DIAGNOSIS — M5416 Radiculopathy, lumbar region: Secondary | ICD-10-CM | POA: Diagnosis not present

## 2023-09-04 DIAGNOSIS — E039 Hypothyroidism, unspecified: Secondary | ICD-10-CM | POA: Diagnosis not present

## 2023-09-04 DIAGNOSIS — E2740 Unspecified adrenocortical insufficiency: Secondary | ICD-10-CM | POA: Diagnosis not present

## 2023-09-06 ENCOUNTER — Ambulatory Visit: Payer: Self-pay

## 2023-09-06 DIAGNOSIS — M797 Fibromyalgia: Secondary | ICD-10-CM | POA: Diagnosis not present

## 2023-09-06 DIAGNOSIS — I5032 Chronic diastolic (congestive) heart failure: Secondary | ICD-10-CM | POA: Diagnosis not present

## 2023-09-06 DIAGNOSIS — I110 Hypertensive heart disease with heart failure: Secondary | ICD-10-CM | POA: Diagnosis not present

## 2023-09-06 DIAGNOSIS — G4733 Obstructive sleep apnea (adult) (pediatric): Secondary | ICD-10-CM | POA: Diagnosis not present

## 2023-09-06 DIAGNOSIS — E2740 Unspecified adrenocortical insufficiency: Secondary | ICD-10-CM | POA: Diagnosis not present

## 2023-09-06 DIAGNOSIS — I2510 Atherosclerotic heart disease of native coronary artery without angina pectoris: Secondary | ICD-10-CM | POA: Diagnosis not present

## 2023-09-06 DIAGNOSIS — Z9682 Presence of neurostimulator: Secondary | ICD-10-CM | POA: Diagnosis not present

## 2023-09-06 DIAGNOSIS — Z79899 Other long term (current) drug therapy: Secondary | ICD-10-CM | POA: Diagnosis not present

## 2023-09-06 DIAGNOSIS — J4540 Moderate persistent asthma, uncomplicated: Secondary | ICD-10-CM | POA: Diagnosis not present

## 2023-09-06 DIAGNOSIS — K219 Gastro-esophageal reflux disease without esophagitis: Secondary | ICD-10-CM | POA: Diagnosis not present

## 2023-09-06 DIAGNOSIS — D352 Benign neoplasm of pituitary gland: Secondary | ICD-10-CM | POA: Diagnosis not present

## 2023-09-06 DIAGNOSIS — E039 Hypothyroidism, unspecified: Secondary | ICD-10-CM | POA: Diagnosis not present

## 2023-09-06 DIAGNOSIS — M47816 Spondylosis without myelopathy or radiculopathy, lumbar region: Secondary | ICD-10-CM | POA: Diagnosis not present

## 2023-09-06 DIAGNOSIS — M5416 Radiculopathy, lumbar region: Secondary | ICD-10-CM | POA: Diagnosis not present

## 2023-09-06 NOTE — Telephone Encounter (Signed)
 Chief Complaint: Dizziness Symptoms: HA Frequency: Began yesterday Pertinent Negatives: Patient denies CP, SOB, vision changes Disposition: [x] ED /[] Urgent Care (no appt availability in office) / [] Appointment(In office/virtual)/ []  [place] [facility]/ [] Home Care/ [] Refused Recommended Disposition /[] [place] [facility]/ []  Follow-up with PCP Additional Notes: Pt notes moderate to severe vertigo that began yesterday, she reports needing assistance every time she ambulates, notes feeling unsteady with HA of 5/10. Denies CP, SOB, vision changes. Advised ED per protocol. This RN educated pt on home care, new-worsening symptoms, when to call back/seek emergent care. Pt verbalized understanding and agrees to plan.  Copied From CRM [id number]. Reason for Triage: Patient's home nurse Belenda Cruise  called stating the patient is having pain with the a level(7/10) and dizziness, she was not with the patient at the time of the call. Patient can be reached at [phone]  Reason for Disposition  SEVERE dizziness (e.g., unable to stand, requires support to walk, feels like passing out now)  Answer Assessment - Initial Assessment Questions 1. DESCRIPTION: "Describe your dizziness."     Vertigo 2. LIGHTHEADED: "Do you feel lightheaded?" (e.g., somewhat faint, woozy, weak upon standing)     None 3. VERTIGO: "Do you feel like either you or the room is spinning or tilting?" (i.e. vertigo)     Yes 4. SEVERITY: "How bad is it?"  "Do you feel like you are going to faint?" "Can you stand and walk?"   - MILD: Feels slightly dizzy, but walking normally.   - MODERATE: Feels unsteady when walking, but not falling; interferes with normal activities (e.g., school, work).   - SEVERE: Unable to walk without falling, or requires assistance to walk without falling; feels like passing out now.      Moderate, sometimes needs help walking 5. ONSET:  "When did the dizziness begin?"     Yesterday 6. AGGRAVATING FACTORS:  "Does anything make it worse?" (e.g., standing, change in head position)     Lying down makes it better 8. CAUSE: "What do you think is causing the dizziness?"     Unknown 9. RECURRENT SYMPTOM: "Have you had dizziness before?" If Yes, ask: "When was the last time?" "What happened that time?"     No history 10. OTHER SYMPTOMS: "Do you have any other symptoms?" (e.g., fever, chest pain, vomiting, diarrhea, bleeding)       HA  Protocols used: Dizziness - Lightheadedness-A-AH

## 2023-09-07 ENCOUNTER — Other Ambulatory Visit: Payer: Self-pay

## 2023-09-07 ENCOUNTER — Telehealth: Payer: Self-pay
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
## 2023-09-10 ENCOUNTER — Encounter: Admitting: Student

## 2023-09-10 DIAGNOSIS — J4550 Severe persistent asthma, uncomplicated: Secondary | ICD-10-CM | POA: Diagnosis not present

## 2023-09-10 NOTE — Telephone Encounter (Signed)
 RTC to patient.  Dizziness has subsided none today.  Given ana appointment for today.  Unable to come has a MRI scheduled in [place].  Forwarded to front desk to schedule an appointment .

## 2023-09-11 ENCOUNTER — Ambulatory Visit: Payer: Self-pay | Admitting: Licensed Clinical Social Worker
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
## 2023-09-11 NOTE — Patient Instructions (Signed)
 Visit Information  Thank you for taking time to visit with me today. Please don't hesitate to contact me if I can be of assistance to you before our next scheduled appointment.  Our next appointment is by telephone on 10/01/23 at 1:30 PM   Please call the care guide team at [phone] if you need to cancel or reschedule your appointment.   Following is a copy of your care plan:   Goals Addressed             This Visit's Progress    VBCI Social Work Care Plan       Problems:   Lacks knowledge of how to connect              Financial Challenges             Decreased energy level             Transportation needs              Breathing issues (uses nebulizer, inhaler)              Depression   CSW Clinical Goal(s):   Over the next 30 days the Patient will attend all scheduled medical appointments AEB patient report and as reported in EPIC record.             Over next 30 days, the Patient will follow up with communiction with LCSW about client housing research and status (looking for a new residence in [place], [place]) AEB her knowledge of housing resources in [place] area  Interventions:  Discussed program resources with RN, LCSW, Pharmacist             Discussed client needs and discussed importance of her communicating her needs with PCP.             Discussed breathing challenges. She said she is short of breath occasionally and has to take needed rest breaks             Used Active Listening to hear client status and needs             Provided counseling support             Discussed mood of client and managing mood issues             Discussed ambulation of client. Discussed sleep issues. Discussed client appetite             Discussed transport needs of client. She has transport benefit with [facility]             Client has UHC and MCD (Dual Complete). She has U card she uses for food, utility help              Discussed needs of client daughter (her daughter is 66 years old  and her daughter has Autism)              Discussed medication procurement of client  Patient Goals/Self-Care Activities:  Connect with provider for ongoing mental health treatment.               Take medications as prescribed              Research housing resources in [place], [place] (affordable housing in that area)             Attend scheduled medical appointments              Practice self care with  getting adequate rest, eating meals on schedule , and speaking regularly with her sister via phone for support  Plan:   LCSW to call client on 10/01/23 at 1:30 PM         Please go to [facility] [address] [place] [phone]) if you are experiencing a Mental Health or Behavioral Health Crisis or need someone to talk to.  The patient verbalized understanding of instructions, educational materials, and care plan provided today and DECLINED offer to receive copy of patient instructions, educational materials, and care plan.   Patient was provided contact information for reaching care team as needed. Patient given name and phone number for contact of LCSW ([phone])   Lorna Few  MSW, LCSW [place]/Value Based Care [facility] Licensed Clinical Social Worker Direct Dial:  [phone] Fax:  [phone] Website:  [email or web address]

## 2023-09-13 ENCOUNTER — Other Ambulatory Visit: Payer: Self-pay

## 2023-09-13 ENCOUNTER — Telehealth: Payer: Self-pay

## 2023-09-13 DIAGNOSIS — G4733 Obstructive sleep apnea (adult) (pediatric): Secondary | ICD-10-CM | POA: Diagnosis not present

## 2023-09-13 DIAGNOSIS — M797 Fibromyalgia: Secondary | ICD-10-CM | POA: Diagnosis not present

## 2023-09-13 DIAGNOSIS — D352 Benign neoplasm of pituitary gland: Secondary | ICD-10-CM | POA: Diagnosis not present

## 2023-09-13 DIAGNOSIS — I5032 Chronic diastolic (congestive) heart failure: Secondary | ICD-10-CM | POA: Diagnosis not present

## 2023-09-13 DIAGNOSIS — Z79899 Other long term (current) drug therapy: Secondary | ICD-10-CM | POA: Diagnosis not present

## 2023-09-13 DIAGNOSIS — J4540 Moderate persistent asthma, uncomplicated: Secondary | ICD-10-CM | POA: Diagnosis not present

## 2023-09-13 DIAGNOSIS — E2740 Unspecified adrenocortical insufficiency: Secondary | ICD-10-CM | POA: Diagnosis not present

## 2023-09-13 DIAGNOSIS — M5416 Radiculopathy, lumbar region: Secondary | ICD-10-CM | POA: Diagnosis not present

## 2023-09-13 DIAGNOSIS — K219 Gastro-esophageal reflux disease without esophagitis: Secondary | ICD-10-CM | POA: Diagnosis not present

## 2023-09-13 DIAGNOSIS — Z9682 Presence of neurostimulator: Secondary | ICD-10-CM | POA: Diagnosis not present

## 2023-09-13 DIAGNOSIS — I110 Hypertensive heart disease with heart failure: Secondary | ICD-10-CM | POA: Diagnosis not present

## 2023-09-13 DIAGNOSIS — M47816 Spondylosis without myelopathy or radiculopathy, lumbar region: Secondary | ICD-10-CM | POA: Diagnosis not present

## 2023-09-13 DIAGNOSIS — E039 Hypothyroidism, unspecified: Secondary | ICD-10-CM | POA: Diagnosis not present

## 2023-09-13 DIAGNOSIS — I2510 Atherosclerotic heart disease of native coronary artery without angina pectoris: Secondary | ICD-10-CM | POA: Diagnosis not present
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
## 2023-09-13 NOTE — Patient Instructions (Signed)
 Visit Information  Thank you for taking time to visit with me today. Please don't hesitate to contact me if I can be of assistance to you before our next scheduled telephone appointment.  Our next appointment is by telephone on 09/20/23 at 8:30am per patient request  Following is a copy of your care plan:   Goals Addressed             This Visit's Progress    VBCI Transitions of Care (TOC) Care Plan       Problems:  Recent Hospitalization for treatment of Hypothyroidism Patient referred to SW for any possible resources   Goal:  Over the next 30 days, the patient will not experience hospital readmission  Interventions:  Asthma: Provided instruction about proper use of medications used for management of Asthma including inhalers Advised patient to engage in light exercise as tolerated 3-5 days a week to aid in the the management of Asthma Discussed the importance of adequate rest and management of fatigue with Asthma Discussed high pollen and limiting exposure   Patient Self Care Activities:  Attend all scheduled provider appointments - Patient has PCP appointment 09/14/23  Call pharmacy for medication refills 3-7 days in advance of running out of medications Call provider office for new concerns or questions  Notify RN Care Manager of [facility] call rescheduling needs Participate in Transition of Care Program/Attend TOC scheduled calls Take medications as prescribed - Patient was seen 09/10/23 by pulmonary - Dulera added to medication list  Plan:  Telephone follow up appointment with care management team member scheduled for: 09/20/23 8:30am per patient request The patient has been provided with contact information for the care management team and has been advised to call with any health related questions or concerns.            Patient verbalizes understanding of instructions and care plan provided today and agrees to view in MyChart. Active MyChart status and patient  understanding of how to access instructions and care plan via MyChart confirmed with patient.     Telephone follow up appointment with care management team member scheduled for: 09/20/23 8:30am per patient request  Please call the care guide team at [phone] if you need to cancel or reschedule your appointment.   Please call the Suicide and Crisis Lifeline: 988 call the [place] National Suicide Prevention Lifeline: 347-636-2409 or TTY: 984-032-8945 TTY 813-231-1696) to talk to a trained counselor call 911 if you are experiencing a Mental Health or Behavioral Health Crisis or need someone to talk to.  Hilbert Odor RN, CCM [place]  VBCI-Population Health RN Care Manager [phone]

## 2023-09-14 ENCOUNTER — Ambulatory Visit: Admitting: Student

## 2023-09-14 VITALS — BP 145/93 | HR 80 | Temp 97.7°F | Ht 65.0 in | Wt 217.3 lb

## 2023-09-14 DIAGNOSIS — H8110 Benign paroxysmal vertigo, unspecified ear: Secondary | ICD-10-CM

## 2023-09-14 MED ORDER — MECLIZINE HCL 12.5 MG PO TABS: 12.5000 mg | ORAL_TABLET | Freq: Three times a day (TID) | ORAL | 0 refills | Status: AC | PRN

## 2023-09-14 NOTE — Patient Instructions (Addendum)
 Thank you, Ms.[name] for allowing [place] to provide your care today. Today we discussed:  Vertigo  - Take half a tablet of gabapentin 300 mg at bedtime - Start meclizine 12.5 mg three times a day as needed to abort dizzy spells - Referral to rehab is sent  I have ordered the following labs for you:  Lab Orders  No laboratory test(s) ordered today     Tests ordered today:  None   Referrals ordered today:   Referral Orders         Ambulatory referral to Physical Therapy       I have ordered the following medication/changed the following medications:   Stop the following medications: There are no discontinued medications.   Start the following medications: Meds ordered this encounter  Medications   meclizine (ANTIVERT) 12.5 MG tablet    Sig: Take 1 tablet (12.5 mg total) by mouth 3 (three) times daily as needed for dizziness.    Dispense:  30 tablet    Refill:  0     Follow up: 2-3 months    Remember:   Should you have any questions or concerns please call the internal medicine clinic at [phone].     Jeral Pinch, DO [facility]

## 2023-09-14 NOTE — Assessment & Plan Note (Signed)
 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  Patient reports that about 3 months ago, she had a fall.  Reports that she had a leak in her ceiling, the floor was wet, she slipped and fell on her back and hitting her head on the floor.  She was evaluated in the ED, had a CT head from [facility] which showed hyperdensity in the pituitary gland measuring up to 4 mm.  She presents today reporting that she is not having these on and off dizzy spells, at random times, occasionally she has room spinning sensation and occasionally she feels out of balance.  During these episodes, she endorses symptoms of nausea and headaches.  Denies any symptoms of tinnitus, hearing loss or ear fullness.  Reports that she has noticed dizzy spells with changes in positions especially in the morning when she stands up from sitting at the side of the bed.  She reports it usually last for about couple of seconds to minutes.  She is concerned now because it has been increasing in frequency to now 3-4 times a day, with 3-4 times a week.  She denies any changes in her medications.  Reports that at home her blood pressures are 130s/80s.  States that she has been off of blood pressure medications due to her underlying adrenal insufficiency.  She is not taking anything over-the-counter.  Per physical exam, hints exam is negative, no nystagmus noted.  During Dix-Hallpike maneuver, she did note dizziness.  Orthostatic vitals negative.  Per chart review, she is noted to start gabapentin 600 mg in January roughly around the time when her symptoms started.  She reports she takes gabapentin 600 mg at bedtime, does report that she was previously on gabapentin several years ago and had not noticed the symptoms.  Denies scratch that dizziness likely multifactorial with the underlying central adrenal insufficiency, questionable pituitary adenoma, medication  side effect.  -Patient is advised to follow-up on the MRI brain that is ordered by the [facility] to follow-up on pituitary adenoma -Will start low-dose meclizine 12.5 mg 3 times daily as needed as abortive mechanism -Referral to vestibular therapy is sent -Patient is advised to return to clinic if her symptoms do not improve
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
## 2023-09-17 NOTE — Addendum Note (Signed)
 Addended by: Vianka Ertel L on: 09/17/2023 09:30 AM   Modules accepted: Level of Service

## 2023-09-17 NOTE — Progress Notes (Signed)
 Internal Medicine Clinic Attending  Case discussed with the resident at the time of the visit.  We reviewed the resident's history and exam and pertinent patient test results.  I agree with the assessment, diagnosis, and plan of care documented in the resident's note.

## 2023-09-18 DIAGNOSIS — M545 Low back pain, unspecified: Secondary | ICD-10-CM | POA: Diagnosis not present

## 2023-09-20 ENCOUNTER — Telehealth: Payer: Self-pay

## 2023-09-20 DIAGNOSIS — K219 Gastro-esophageal reflux disease without esophagitis: Secondary | ICD-10-CM | POA: Diagnosis not present

## 2023-09-20 DIAGNOSIS — G4733 Obstructive sleep apnea (adult) (pediatric): Secondary | ICD-10-CM | POA: Diagnosis not present

## 2023-09-20 DIAGNOSIS — Z9682 Presence of neurostimulator: Secondary | ICD-10-CM | POA: Diagnosis not present

## 2023-09-20 DIAGNOSIS — D352 Benign neoplasm of pituitary gland: Secondary | ICD-10-CM | POA: Diagnosis not present

## 2023-09-20 DIAGNOSIS — I110 Hypertensive heart disease with heart failure: Secondary | ICD-10-CM | POA: Diagnosis not present

## 2023-09-20 DIAGNOSIS — I5032 Chronic diastolic (congestive) heart failure: Secondary | ICD-10-CM | POA: Diagnosis not present

## 2023-09-20 DIAGNOSIS — E039 Hypothyroidism, unspecified: Secondary | ICD-10-CM | POA: Diagnosis not present

## 2023-09-20 DIAGNOSIS — Z79899 Other long term (current) drug therapy: Secondary | ICD-10-CM | POA: Diagnosis not present

## 2023-09-20 DIAGNOSIS — M5416 Radiculopathy, lumbar region: Secondary | ICD-10-CM | POA: Diagnosis not present

## 2023-09-20 DIAGNOSIS — I2510 Atherosclerotic heart disease of native coronary artery without angina pectoris: Secondary | ICD-10-CM | POA: Diagnosis not present

## 2023-09-20 DIAGNOSIS — M797 Fibromyalgia: Secondary | ICD-10-CM | POA: Diagnosis not present

## 2023-09-20 DIAGNOSIS — J4540 Moderate persistent asthma, uncomplicated: Secondary | ICD-10-CM | POA: Diagnosis not present

## 2023-09-20 DIAGNOSIS — M47816 Spondylosis without myelopathy or radiculopathy, lumbar region: Secondary | ICD-10-CM | POA: Diagnosis not present

## 2023-09-20 DIAGNOSIS — E2740 Unspecified adrenocortical insufficiency: Secondary | ICD-10-CM | POA: Diagnosis not present

## 2023-09-20 NOTE — Transitions of Care (Post Inpatient/ED Visit) (Signed)
 Care Management  Transitions of Care Program Transitions of Care Post-discharge Week 5  09/20/2023 Name: [name] MRN: [id number] DOB: 03-Sep-1965  Subjective: [name] is a 58 y.o. year old female who is a primary care patient of Masters, Alston Jerry, DO. The Care Management team spoke with patient by telephone to assess and address transitions of care needs. Patient requested to reschedule to tomorrow 8:30am  Plan: Call rescheduled to 09/21/23 8:30am per patient request  Tonia Frankel RN, CCM [place]  VBCI-Population Health RN Care Manager [phone]

## 2023-09-21 ENCOUNTER — Telehealth: Payer: Self-pay

## 2023-09-21 ENCOUNTER — Other Ambulatory Visit: Payer: Self-pay
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
## 2023-09-21 NOTE — Transitions of Care (Post Inpatient/ED Visit) (Signed)
 Care Management  Transitions of Care Program Transitions of Care Post-discharge Week 5  09/21/2023 Name: [name] MRN: [id number] DOB: 1965/12/20  Subjective: [name] is a 58 y.o. year old female who is a primary care patient of Masters, Alston Jerry, DO. The Care Management team was unable to reach the patient by phone to assess and address transitions of care needs.   Plan: Additional outreach attempts will be made to reach the patient enrolled in the [facility] Program (Post Inpatient/ED Visit).  Tonia Frankel RN, CCM [place]  VBCI-Population Health RN Care Manager [phone]

## 2023-09-21 NOTE — Patient Instructions (Signed)
 Visit Information  Thank you for taking time to visit with me today.    Following is a copy of your care plan:   Goals Addressed               This Visit's Progress     COMPLETED: TOC Care Plan - Patient will report no readmissions in the next 30 days (pt-stated)        Current Barriers:  Medication management TOC RN and patient reviewed at length the medication list. Patient has meds on hold pending PCP appointment 08/22/23 (08/31/23 patient reports she reviewed medications with PCP and list was updated) 09/07/23 Medications/allergies not reviewed - focus on listening to patient and attempted to schedule appointment with PCP - patient wants to call them herself to schedule.  RNCM Clinical Goal(s): (Ongoing for 30 day TOC Program) Patient will work with the Care Management team over the next 30 days to address Transition of Care Barriers: Medication Management - addressed Patient will take all medications exactly as prescribed and will take medications and discharge papers to PCP appointment for review Patient will demonstrate understanding of rationale for each prescribed medication as evidenced by patient report Patient will attend all scheduled medical appointments: PCP appointment 08/22/23 and CHF telephone appointment 08/22/23 - MET  Interventions: Evaluation of current treatment plan related to  self management and patient's adherence to plan as established by provider  Transitions of Care:  Goal on track:  Yes. Doctor Visits  - discussed the importance of doctor visits: 09/07/23 [facility] RN discussed patient scheduling appointment with PCP to discuss Caregiver burden and options for treatment Communication with PCP re: medication changes and medications that are currently being held - (08/31/23 patient reports she reviewed with PCP and list is updated) Discussed caregiver burden and encouraged patient to take time each day for herself and to listen to her body (09/07/23 TOC RN noted in record a  triage call with patient reporting symptoms of dizziness, vertigo and had  been instructed to go to ED - [facility] RN discussed symptoms and patient stated she feels she was mentally exhausted and "My body was just exhausted". Patient opened up to [facility] RN about her 37 year old daughter with autism and her 76 year old daughter who experienced a cardiac event after giving birth, who is still recovering in rehab and some personal issues she is experiencing with her sister. TOC RN allowed patient time to discuss her feelings and then offered to call PCP to schedule appointment - Patient prefers to call and schedule the appointment when he schedule allows. Message sent to PCP to make her aware. Patient has appointment with SW, Geralyn Knee 09/11/23. TOC RN encouraged patient to speak openly with SW. TOC RN encouraged patient to call back if she needs to talk.   Heart Failure Interventions:  (Status:  Goal on track:  Yes.) Short Term Goal Basic overview and discussion of pathophysiology of Heart Failure reviewed Provided education on low sodium diet Provided education about placing scale on hard, flat surface Advised patient to weigh each morning after emptying bladder Discussed importance of daily weight and advised patient to weigh and record daily  Patient Goals/Self-Care Activities: Participate in Transition of Care Program/Attend [facility] scheduled calls Notify RN Care Manager of TOC call rescheduling needs Take all medications as prescribed - take medication bottles and discharge papers to PCP appointment 08/22/23 (08/31/23 patient reports seeing PCP and reviewing meds and list updated) Attend all scheduled provider appointments Weigh daily and report 2lb  weight gain overnight or 5lbs weight gain in a week Notify MD or RN with return of weakness, fatigue, any difficulty breathing, worsening pain or new issues/concerns  Patient will find time each day for self care  Patient will work with SW and find therapist  Patient  will call PCP to schedule appointment and to discuss her feelings and possible medication management  Follow Up Plan:  Telephone follow up appointment with care management team member scheduled for:  09/13/23 8:30am per patient request The patient has been provided with contact information for the care management team and has been advised to call with any health related questions or concerns.        COMPLETED: VBCI Transitions of Care (TOC) Care Plan        Problems: TOC Program completed - goals met - patient declined CCM referral  Recent Hospitalization for treatment of Hypothyroidism Patient referred to SW for any possible resources- patient is talking with Geralyn Knee   Goal: TOC Program completed - goals met - patient declined CCM referral  Over the next 30 days, the patient will not experience hospital readmission  Interventions:  Asthma: Provided instruction about proper use of medications used for management of Asthma including inhalers Advised patient to engage in light exercise as tolerated 3-5 days a week to aid in the the management of Asthma Discussed the importance of adequate rest and management of fatigue with Asthma Discussed high pollen and limiting exposure   Patient Self Care Activities: [facility] Program completed - goals met - patient declined CCM referral  Attend all scheduled provider appointments - Patient has PCP appointment 09/14/23 - completed Call pharmacy for medication refills 3-7 days in advance of running out of medications Call provider office for new concerns or questions  Notify RN Care Manager of TOC call rescheduling needs Participate in Transition of Care Program/Attend TOC scheduled calls Take medications as prescribed - Patient was seen 09/10/23 by pulmonary - Dulera  added to medication list  Plan:  [facility] Program completed - goals met - patient declined CCM referral  The patient has been provided with contact information for the care management team and has been  advised to call with any health related questions or concerns.            Patient verbalizes understanding of instructions and care plan provided today and agrees to view in MyChart. Active MyChart status and patient understanding of how to access instructions and care plan via MyChart confirmed with patient.      Please call the Suicide and Crisis Lifeline: 988 call the [place]  National Suicide Prevention Lifeline: 970-007-3830 or TTY: (727)351-4822 TTY 438-837-7022) to talk to a trained counselor call 911 if you are experiencing a Mental Health or Behavioral Health Crisis or need someone to talk to. Tonia Frankel RN, CCM   VBCI-Population Health RN Care Manager [phone]

## 2023-09-24 DIAGNOSIS — R200 Anesthesia of skin: Secondary | ICD-10-CM | POA: Diagnosis not present

## 2023-09-24 DIAGNOSIS — M6281 Muscle weakness (generalized): Secondary | ICD-10-CM | POA: Diagnosis not present

## 2023-09-24 DIAGNOSIS — R2689 Other abnormalities of gait and mobility: Secondary | ICD-10-CM | POA: Diagnosis not present

## 2023-09-24 DIAGNOSIS — M545 Low back pain, unspecified: Secondary | ICD-10-CM | POA: Diagnosis not present

## 2023-09-27 DIAGNOSIS — Z9689 Presence of other specified functional implants: Secondary | ICD-10-CM | POA: Diagnosis not present

## 2023-09-27 DIAGNOSIS — D352 Benign neoplasm of pituitary gland: Secondary | ICD-10-CM | POA: Diagnosis not present

## 2023-09-27 NOTE — Telephone Encounter (Signed)
 TOC nurse was calling patient and she declined calls from CCM Team for [facility]   Thank you  Barnie Bora  [facility], [facility] Guide  Direct Dial: [phone]  Fax [phone]

## 2023-10-01 ENCOUNTER — Telehealth: Payer: Self-pay | Admitting: Licensed Clinical Social Worker

## 2023-10-01 ENCOUNTER — Encounter: Payer: Self-pay | Admitting: Licensed Clinical Social Worker

## 2023-10-02 ENCOUNTER — Telehealth: Payer: Self-pay | Admitting: *Deleted

## 2023-10-02 DIAGNOSIS — M5416 Radiculopathy, lumbar region: Secondary | ICD-10-CM | POA: Diagnosis not present

## 2023-10-02 DIAGNOSIS — M47816 Spondylosis without myelopathy or radiculopathy, lumbar region: Secondary | ICD-10-CM | POA: Diagnosis not present

## 2023-10-02 DIAGNOSIS — K219 Gastro-esophageal reflux disease without esophagitis: Secondary | ICD-10-CM | POA: Diagnosis not present

## 2023-10-02 DIAGNOSIS — Z79899 Other long term (current) drug therapy: Secondary | ICD-10-CM | POA: Diagnosis not present

## 2023-10-02 DIAGNOSIS — M797 Fibromyalgia: Secondary | ICD-10-CM | POA: Diagnosis not present

## 2023-10-02 DIAGNOSIS — E039 Hypothyroidism, unspecified: Secondary | ICD-10-CM | POA: Diagnosis not present

## 2023-10-02 DIAGNOSIS — I5032 Chronic diastolic (congestive) heart failure: Secondary | ICD-10-CM | POA: Diagnosis not present

## 2023-10-02 DIAGNOSIS — I2510 Atherosclerotic heart disease of native coronary artery without angina pectoris: Secondary | ICD-10-CM | POA: Diagnosis not present

## 2023-10-02 DIAGNOSIS — G4733 Obstructive sleep apnea (adult) (pediatric): Secondary | ICD-10-CM | POA: Diagnosis not present

## 2023-10-02 DIAGNOSIS — Z9682 Presence of neurostimulator: Secondary | ICD-10-CM | POA: Diagnosis not present

## 2023-10-02 DIAGNOSIS — I110 Hypertensive heart disease with heart failure: Secondary | ICD-10-CM | POA: Diagnosis not present

## 2023-10-02 DIAGNOSIS — J4540 Moderate persistent asthma, uncomplicated: Secondary | ICD-10-CM | POA: Diagnosis not present

## 2023-10-02 DIAGNOSIS — E2740 Unspecified adrenocortical insufficiency: Secondary | ICD-10-CM | POA: Diagnosis not present

## 2023-10-02 DIAGNOSIS — D352 Benign neoplasm of pituitary gland: Secondary | ICD-10-CM | POA: Diagnosis not present

## 2023-10-02 NOTE — Telephone Encounter (Signed)
 Return call to Kristin PT with [facility] (out of [place])- stated pt has chronic pain, hx of Fibromyalgia. Pain level 7/8- they have to report pain level above 7.

## 2023-10-02 NOTE — Telephone Encounter (Signed)
 Copied from CRM [id number]. Topic: Clinical - Home Health Verbal Orders >> Oct 02, 2023  3:11 PM Carrielelia G wrote: Caller/Agency: Amadeo Backers with [facility]  Callback Number: [phone] Service Requested: Physical Therapy  Any new concerns about the patient? Yes Patient is having pain, 7/8 out of 10

## 2023-10-03 ENCOUNTER — Encounter: Payer: 59 | Admitting: Student

## 2023-10-03 DIAGNOSIS — R200 Anesthesia of skin: Secondary | ICD-10-CM | POA: Diagnosis not present

## 2023-10-03 DIAGNOSIS — M545 Low back pain, unspecified: Secondary | ICD-10-CM | POA: Diagnosis not present

## 2023-10-03 DIAGNOSIS — M6281 Muscle weakness (generalized): Secondary | ICD-10-CM | POA: Diagnosis not present

## 2023-10-03 DIAGNOSIS — R2689 Other abnormalities of gait and mobility: Secondary | ICD-10-CM | POA: Diagnosis not present

## 2023-10-05 DIAGNOSIS — G4733 Obstructive sleep apnea (adult) (pediatric): Secondary | ICD-10-CM | POA: Diagnosis not present

## 2023-10-05 DIAGNOSIS — K219 Gastro-esophageal reflux disease without esophagitis: Secondary | ICD-10-CM | POA: Diagnosis not present

## 2023-10-05 DIAGNOSIS — J4540 Moderate persistent asthma, uncomplicated: Secondary | ICD-10-CM | POA: Diagnosis not present

## 2023-10-05 DIAGNOSIS — E039 Hypothyroidism, unspecified: Secondary | ICD-10-CM | POA: Diagnosis not present

## 2023-10-05 DIAGNOSIS — I5032 Chronic diastolic (congestive) heart failure: Secondary | ICD-10-CM | POA: Diagnosis not present

## 2023-10-05 DIAGNOSIS — E2740 Unspecified adrenocortical insufficiency: Secondary | ICD-10-CM | POA: Diagnosis not present

## 2023-10-05 DIAGNOSIS — I2510 Atherosclerotic heart disease of native coronary artery without angina pectoris: Secondary | ICD-10-CM | POA: Diagnosis not present

## 2023-10-05 DIAGNOSIS — M5416 Radiculopathy, lumbar region: Secondary | ICD-10-CM | POA: Diagnosis not present

## 2023-10-05 DIAGNOSIS — Z9682 Presence of neurostimulator: Secondary | ICD-10-CM | POA: Diagnosis not present

## 2023-10-05 DIAGNOSIS — M797 Fibromyalgia: Secondary | ICD-10-CM | POA: Diagnosis not present

## 2023-10-05 DIAGNOSIS — D352 Benign neoplasm of pituitary gland: Secondary | ICD-10-CM | POA: Diagnosis not present

## 2023-10-05 DIAGNOSIS — Z79899 Other long term (current) drug therapy: Secondary | ICD-10-CM | POA: Diagnosis not present

## 2023-10-05 DIAGNOSIS — I110 Hypertensive heart disease with heart failure: Secondary | ICD-10-CM | POA: Diagnosis not present

## 2023-10-05 DIAGNOSIS — M47816 Spondylosis without myelopathy or radiculopathy, lumbar region: Secondary | ICD-10-CM | POA: Diagnosis not present

## 2023-10-09 DIAGNOSIS — M5416 Radiculopathy, lumbar region: Secondary | ICD-10-CM | POA: Diagnosis not present

## 2023-10-09 DIAGNOSIS — Z9682 Presence of neurostimulator: Secondary | ICD-10-CM | POA: Diagnosis not present

## 2023-10-09 DIAGNOSIS — J4540 Moderate persistent asthma, uncomplicated: Secondary | ICD-10-CM | POA: Diagnosis not present

## 2023-10-09 DIAGNOSIS — Z79899 Other long term (current) drug therapy: Secondary | ICD-10-CM | POA: Diagnosis not present

## 2023-10-09 DIAGNOSIS — G4733 Obstructive sleep apnea (adult) (pediatric): Secondary | ICD-10-CM | POA: Diagnosis not present

## 2023-10-09 DIAGNOSIS — I2510 Atherosclerotic heart disease of native coronary artery without angina pectoris: Secondary | ICD-10-CM | POA: Diagnosis not present

## 2023-10-09 DIAGNOSIS — I110 Hypertensive heart disease with heart failure: Secondary | ICD-10-CM | POA: Diagnosis not present

## 2023-10-09 DIAGNOSIS — E2740 Unspecified adrenocortical insufficiency: Secondary | ICD-10-CM | POA: Diagnosis not present

## 2023-10-09 DIAGNOSIS — I5032 Chronic diastolic (congestive) heart failure: Secondary | ICD-10-CM | POA: Diagnosis not present

## 2023-10-09 DIAGNOSIS — M797 Fibromyalgia: Secondary | ICD-10-CM | POA: Diagnosis not present

## 2023-10-09 DIAGNOSIS — K219 Gastro-esophageal reflux disease without esophagitis: Secondary | ICD-10-CM | POA: Diagnosis not present

## 2023-10-09 DIAGNOSIS — D352 Benign neoplasm of pituitary gland: Secondary | ICD-10-CM | POA: Diagnosis not present

## 2023-10-09 DIAGNOSIS — E039 Hypothyroidism, unspecified: Secondary | ICD-10-CM | POA: Diagnosis not present

## 2023-10-09 DIAGNOSIS — M47816 Spondylosis without myelopathy or radiculopathy, lumbar region: Secondary | ICD-10-CM | POA: Diagnosis not present

## 2023-10-11 ENCOUNTER — Ambulatory Visit: Payer: Self-pay | Admitting: Internal Medicine

## 2023-10-11 DIAGNOSIS — M5416 Radiculopathy, lumbar region: Secondary | ICD-10-CM | POA: Diagnosis not present

## 2023-10-11 DIAGNOSIS — E039 Hypothyroidism, unspecified: Secondary | ICD-10-CM | POA: Diagnosis not present

## 2023-10-11 DIAGNOSIS — I110 Hypertensive heart disease with heart failure: Secondary | ICD-10-CM | POA: Diagnosis not present

## 2023-10-11 DIAGNOSIS — D352 Benign neoplasm of pituitary gland: Secondary | ICD-10-CM | POA: Diagnosis not present

## 2023-10-11 DIAGNOSIS — M47816 Spondylosis without myelopathy or radiculopathy, lumbar region: Secondary | ICD-10-CM | POA: Diagnosis not present

## 2023-10-11 DIAGNOSIS — J4540 Moderate persistent asthma, uncomplicated: Secondary | ICD-10-CM | POA: Diagnosis not present

## 2023-10-11 DIAGNOSIS — Z9682 Presence of neurostimulator: Secondary | ICD-10-CM | POA: Diagnosis not present

## 2023-10-11 DIAGNOSIS — Z79899 Other long term (current) drug therapy: Secondary | ICD-10-CM | POA: Diagnosis not present

## 2023-10-11 DIAGNOSIS — M6281 Muscle weakness (generalized): Secondary | ICD-10-CM | POA: Diagnosis not present

## 2023-10-11 DIAGNOSIS — K219 Gastro-esophageal reflux disease without esophagitis: Secondary | ICD-10-CM | POA: Diagnosis not present

## 2023-10-11 DIAGNOSIS — M797 Fibromyalgia: Secondary | ICD-10-CM | POA: Diagnosis not present

## 2023-10-11 DIAGNOSIS — I2510 Atherosclerotic heart disease of native coronary artery without angina pectoris: Secondary | ICD-10-CM | POA: Diagnosis not present

## 2023-10-11 DIAGNOSIS — E2740 Unspecified adrenocortical insufficiency: Secondary | ICD-10-CM | POA: Diagnosis not present

## 2023-10-11 DIAGNOSIS — R200 Anesthesia of skin: Secondary | ICD-10-CM | POA: Diagnosis not present

## 2023-10-11 DIAGNOSIS — I5032 Chronic diastolic (congestive) heart failure: Secondary | ICD-10-CM | POA: Diagnosis not present

## 2023-10-11 DIAGNOSIS — R2689 Other abnormalities of gait and mobility: Secondary | ICD-10-CM | POA: Diagnosis not present

## 2023-10-11 DIAGNOSIS — G4733 Obstructive sleep apnea (adult) (pediatric): Secondary | ICD-10-CM | POA: Diagnosis not present

## 2023-10-11 DIAGNOSIS — M545 Low back pain, unspecified: Secondary | ICD-10-CM | POA: Diagnosis not present

## 2023-10-11 NOTE — Telephone Encounter (Signed)
 Return call to [place] [facility] with [facility]- stated pt is doing good; she wants to see pt one more time this week then discharge pt next week; then she may do outpt PT.  VO given "PT to see pt one more time this week". Sending to MD for approval or denial.

## 2023-10-11 NOTE — Telephone Encounter (Signed)
 Please message below that  [name] sent below for VO.  Copied from CRM [id number]. Topic: Clinical - Home Health Verbal Orders >> Oct 11, 2023  1:34 PM Retta Caster wrote: Caller/Agency: Kisrtine from [facility]  Callback Number: [phone] Service Requested:Occupational  Frequency: 1 more visit for this week Any new concerns about the patient? No

## 2023-10-16 ENCOUNTER — Telehealth: Payer: Self-pay | Admitting: *Deleted

## 2023-10-16 DIAGNOSIS — S6991XA Unspecified injury of right wrist, hand and finger(s), initial encounter: Secondary | ICD-10-CM | POA: Diagnosis not present

## 2023-10-16 NOTE — Telephone Encounter (Unsigned)
 Copied from CRM [id number]. Topic: Clinical - Lab/Test Results >> Oct 16, 2023  3:59 PM Carrielelia G wrote: Reason for CRM: Please Call Pt [name] regarding her MRI results from [facility] 09/28/23

## 2023-10-18 DIAGNOSIS — G4733 Obstructive sleep apnea (adult) (pediatric): Secondary | ICD-10-CM | POA: Diagnosis not present

## 2023-10-19 DIAGNOSIS — G4733 Obstructive sleep apnea (adult) (pediatric): Secondary | ICD-10-CM | POA: Diagnosis not present

## 2023-10-19 DIAGNOSIS — M47816 Spondylosis without myelopathy or radiculopathy, lumbar region: Secondary | ICD-10-CM | POA: Diagnosis not present

## 2023-10-19 DIAGNOSIS — I5032 Chronic diastolic (congestive) heart failure: Secondary | ICD-10-CM | POA: Diagnosis not present

## 2023-10-19 DIAGNOSIS — K219 Gastro-esophageal reflux disease without esophagitis: Secondary | ICD-10-CM | POA: Diagnosis not present

## 2023-10-19 DIAGNOSIS — R2689 Other abnormalities of gait and mobility: Secondary | ICD-10-CM | POA: Diagnosis not present

## 2023-10-19 DIAGNOSIS — M545 Low back pain, unspecified: Secondary | ICD-10-CM | POA: Diagnosis not present

## 2023-10-19 DIAGNOSIS — M6281 Muscle weakness (generalized): Secondary | ICD-10-CM | POA: Diagnosis not present

## 2023-10-19 DIAGNOSIS — J4540 Moderate persistent asthma, uncomplicated: Secondary | ICD-10-CM | POA: Diagnosis not present

## 2023-10-19 DIAGNOSIS — M5416 Radiculopathy, lumbar region: Secondary | ICD-10-CM | POA: Diagnosis not present

## 2023-10-19 DIAGNOSIS — I110 Hypertensive heart disease with heart failure: Secondary | ICD-10-CM | POA: Diagnosis not present

## 2023-10-19 DIAGNOSIS — Z79899 Other long term (current) drug therapy: Secondary | ICD-10-CM | POA: Diagnosis not present

## 2023-10-19 DIAGNOSIS — D352 Benign neoplasm of pituitary gland: Secondary | ICD-10-CM | POA: Diagnosis not present

## 2023-10-19 DIAGNOSIS — M797 Fibromyalgia: Secondary | ICD-10-CM | POA: Diagnosis not present

## 2023-10-19 DIAGNOSIS — Z9682 Presence of neurostimulator: Secondary | ICD-10-CM | POA: Diagnosis not present

## 2023-10-19 DIAGNOSIS — E039 Hypothyroidism, unspecified: Secondary | ICD-10-CM | POA: Diagnosis not present

## 2023-10-19 DIAGNOSIS — R200 Anesthesia of skin: Secondary | ICD-10-CM | POA: Diagnosis not present

## 2023-10-19 DIAGNOSIS — I2510 Atherosclerotic heart disease of native coronary artery without angina pectoris: Secondary | ICD-10-CM | POA: Diagnosis not present

## 2023-10-19 DIAGNOSIS — E2740 Unspecified adrenocortical insufficiency: Secondary | ICD-10-CM | POA: Diagnosis not present

## 2023-10-19 NOTE — Telephone Encounter (Signed)
 Called and spoke with the patient about new patient appts not ava until June.

## 2023-10-22 ENCOUNTER — Telehealth: Payer: Self-pay | Admitting: *Deleted

## 2023-10-22 NOTE — Telephone Encounter (Signed)
 Will forward to PCP. Copied from CRM [id number]. Topic: General - Other >> Oct 19, 2023 12:45 PM Adrianna P wrote: Reason for CRM: Physical therapist called to inform dr that patient's pain was a 7/10 today.

## 2023-10-23 NOTE — Telephone Encounter (Addendum)
 RTC to patient states was having a bad day.  Thought he was going into a crisis.  Had been doing a lot when she had the high pain levels.  Thought she was at the point of needing to come to the ER.  Decided to slow down went to bed and rested and took care of herself.  Pain is usually 5-6 daily.  [name] ok now.  Was asked to give the Clinics a call if her pain should worsen.

## 2023-10-24 ENCOUNTER — Other Ambulatory Visit: Payer: Self-pay | Admitting: Cardiovascular Disease

## 2023-10-24 DIAGNOSIS — G4733 Obstructive sleep apnea (adult) (pediatric): Secondary | ICD-10-CM | POA: Diagnosis not present

## 2023-10-25 ENCOUNTER — Other Ambulatory Visit: Payer: Self-pay | Admitting: Cardiovascular Disease

## 2023-10-31 DIAGNOSIS — R42 Dizziness and giddiness: Secondary | ICD-10-CM | POA: Diagnosis not present

## 2023-11-01 DIAGNOSIS — R2689 Other abnormalities of gait and mobility: Secondary | ICD-10-CM | POA: Diagnosis not present

## 2023-11-01 DIAGNOSIS — M545 Low back pain, unspecified: Secondary | ICD-10-CM | POA: Diagnosis not present

## 2023-11-01 DIAGNOSIS — M6281 Muscle weakness (generalized): Secondary | ICD-10-CM | POA: Diagnosis not present

## 2023-11-07 ENCOUNTER — Other Ambulatory Visit: Payer: Self-pay | Admitting: Internal Medicine

## 2023-11-07 NOTE — Telephone Encounter (Signed)
 Medication sent to pharmacy

## 2023-11-12 ENCOUNTER — Telehealth: Payer: Self-pay | Admitting: Licensed Clinical Social Worker

## 2023-11-12 ENCOUNTER — Encounter: Payer: Self-pay | Admitting: Licensed Clinical Social Worker

## 2023-11-19 ENCOUNTER — Telehealth: Payer: Self-pay

## 2023-11-19 DIAGNOSIS — R42 Dizziness and giddiness: Secondary | ICD-10-CM | POA: Diagnosis not present

## 2023-11-19 NOTE — Telephone Encounter (Signed)
 Unable to reach pt, left message to call the clinic back regarding her paperwork.

## 2023-11-19 NOTE — Telephone Encounter (Signed)
 Copied from CRM [id number]. Topic: General - Other >> Nov 16, 2023 12:11 PM Retta Caster wrote: Reason for CRM: Patient needs call back on how to upload Document for letter she is diabled for her Finanalcial aide. [phone]

## 2023-11-20 DIAGNOSIS — R2689 Other abnormalities of gait and mobility: Secondary | ICD-10-CM | POA: Diagnosis not present

## 2023-11-20 DIAGNOSIS — R200 Anesthesia of skin: Secondary | ICD-10-CM | POA: Diagnosis not present

## 2023-11-20 DIAGNOSIS — M545 Low back pain, unspecified: Secondary | ICD-10-CM | POA: Diagnosis not present

## 2023-11-20 DIAGNOSIS — M6281 Muscle weakness (generalized): Secondary | ICD-10-CM | POA: Diagnosis not present

## 2023-11-21 ENCOUNTER — Encounter: Payer: Self-pay | Admitting: *Deleted

## 2023-11-21 ENCOUNTER — Other Ambulatory Visit: Payer: Self-pay | Admitting: Cardiovascular Disease

## 2023-11-21 NOTE — Telephone Encounter (Unsigned)
 Copied from CRM [id number]. Topic: General - Other >> Nov 21, 2023  8:48 AM Tisa Forester wrote: Reason for CRM: Patient have question for  Mae if received the paperwork for doctor to fill out was fax on 11/20/23  . Used the fax number [phone] Patient call back number [phone]

## 2023-12-05 ENCOUNTER — Ambulatory Visit (INDEPENDENT_AMBULATORY_CARE_PROVIDER_SITE_OTHER): Admitting: Student

## 2023-12-05 VITALS — BP 149/92 | HR 86 | Temp 97.9°F | Ht 65.0 in | Wt 202.6 lb

## 2023-12-05 DIAGNOSIS — F4320 Adjustment disorder, unspecified: Secondary | ICD-10-CM

## 2023-12-05 DIAGNOSIS — I5032 Chronic diastolic (congestive) heart failure: Secondary | ICD-10-CM

## 2023-12-05 DIAGNOSIS — M25531 Pain in right wrist: Secondary | ICD-10-CM

## 2023-12-05 DIAGNOSIS — F332 Major depressive disorder, recurrent severe without psychotic features: Secondary | ICD-10-CM | POA: Diagnosis not present

## 2023-12-05 DIAGNOSIS — M5416 Radiculopathy, lumbar region: Secondary | ICD-10-CM

## 2023-12-05 DIAGNOSIS — M79641 Pain in right hand: Secondary | ICD-10-CM

## 2023-12-05 DIAGNOSIS — I110 Hypertensive heart disease with heart failure: Secondary | ICD-10-CM | POA: Diagnosis not present

## 2023-12-05 DIAGNOSIS — F4329 Adjustment disorder with other symptoms: Secondary | ICD-10-CM

## 2023-12-05 DIAGNOSIS — K219 Gastro-esophageal reflux disease without esophagitis: Secondary | ICD-10-CM | POA: Diagnosis not present

## 2023-12-05 DIAGNOSIS — E2740 Unspecified adrenocortical insufficiency: Secondary | ICD-10-CM

## 2023-12-05 DIAGNOSIS — I10 Essential (primary) hypertension: Secondary | ICD-10-CM

## 2023-12-05 DIAGNOSIS — N951 Menopausal and female climacteric states: Secondary | ICD-10-CM

## 2023-12-05 MED ORDER — ESCITALOPRAM OXALATE 10 MG PO TABS: 10.0000 mg | ORAL_TABLET | Freq: Every day | ORAL | 4 refills | Status: AC

## 2023-12-05 MED ORDER — LIDOCAINE-PRILOCAINE 2.5-2.5 % EX CREA: TOPICAL_CREAM | Freq: Once | CUTANEOUS | 0 refills | Status: AC

## 2023-12-05 MED ORDER — FLUTICASONE PROPIONATE 50 MCG/ACT NA SUSP: 2.0000 | Freq: Every day | NASAL | 2 refills | Status: DC

## 2023-12-05 MED ORDER — FAMOTIDINE 20 MG PO TABS: 20.0000 mg | ORAL_TABLET | Freq: Two times a day (BID) | ORAL | 2 refills | Status: DC

## 2023-12-05 MED ORDER — GABAPENTIN 600 MG PO TABS: 600.0000 mg | ORAL_TABLET | Freq: Every day | ORAL | 0 refills | Status: DC

## 2023-12-05 NOTE — Progress Notes (Unsigned)
 Established Patient Office Visit  Subjective   Patient ID: [name], female    DOB: [birth year]  Age: 58 y.o. MRN: [id number]  Chief Complaint  Patient presents with   Letter for School/Work   HPI  This is a 58 year old female with past medical history of hypertension, chronic diastolic heart failure, chronic migraines, asthma, GERD, adrenal insufficiency, epilepsy, chronic lumbar pain, BPPV, peripheral myalgia, stress and adjustment reaction disorder, presents today for paperwork and medication refills.  Patient reports that she went back to school, for school counseling program.  Reports that she apply for financial aid, needed forms to be filled out in the setting of her disability.  Reports that the skilled counseling program that is provided at the [facility] does accommodate her in the setting of her disability.  She is very motivated in completing this program.  Paperwork provided.  ROS   As per assessment and plan Objective:     BP (!) 149/92 (BP Location: Left Arm, Patient Position: Sitting, Cuff Size: Small)   Pulse 86   Temp 97.9 F (36.6 C) (Oral)   Ht 5' 5 (1.651 m)   Wt 202 lb 9.6 oz (91.9 kg)   LMP 04/13/2018   SpO2 97%   BMI 33.71 kg/m  BP Readings from Last 3 Encounters:  12/05/23 (!) 149/92  09/14/23 (!) 145/93  09/10/23 (!) 136/92   Wt Readings from Last 3 Encounters:  12/05/23 202 lb 9.6 oz (91.9 kg)  09/14/23 217 lb 4.8 oz (98.6 kg)  09/10/23 213 lb (96.6 kg)      Physical Exam  General: Sitting in chair, no acute distress Cardiovascular: Regular rate Pulmonary: Breathing comfortably Abdomen: Soft, nontender, nondistended MSK: No lower extremity edema bilaterally  Assessment & Plan:  Patient is discussed with Dr Trudy   Problem List Items Addressed This Visit       Cardiovascular and Mediastinum   Essential hypertension   BP Readings from Last 3 Encounters:  12/05/23 (!) 149/92  09/14/23 (!) 145/93  09/10/23 (!)  136/92   Per the last office visit 03/19, all of her medications were stopped due to low BP, these medications were Coreg  25 BID, chlorthalidone  25 mg daily, Imdur  90 mg twice daily, spironolactone  50 mg daily. She is currently not taking any GDMT medications. Denies any CP or SOB. Blood pressure is elevated in the clinic.  - 2 week RN visit for BP check, if it is still elevated, then will plan on starting ACEi   - 2 week RN BP check.       Chronic diastolic heart failure (HCC)   ECHO with EF 55 - 60 % with G1DD. All GDMT medications were stopped at the last OV d/t hypotension         Digestive   GERD (gastroesophageal reflux disease) (Chronic)   Needs refill on Prilosec, reports acid reflux.  - Prilosec 20 mg daily       Relevant Medications   famotidine  (PEPCID ) 20 MG tablet     Endocrine   Adrenal insufficiency (HCC) - Primary   Last admitted to [facility] on 08/15/2023 discharged on 3/14 with Cortef  taper, patient was supposed to follow-up with endocrinologist to establish care.  However patient reports that she has not been unable to schedule an appointment with endocrinologist.  She is currently not taking any steroid medications. Reports that she would like a new referral to endocrinologist. - Referral to endocrinologist is sent       Relevant Orders  Ambulatory referral to Endocrinology     Nervous and Auditory   Lumbar back pain with radiculopathy affecting right lower extremity   Relevant Medications   escitalopram  (LEXAPRO ) 10 MG tablet   gabapentin  (NEURONTIN ) 600 MG tablet     Other   Moderately severe recurrent major depression (HCC)   Relevant Medications   escitalopram  (LEXAPRO ) 10 MG tablet   Stress and adjustment reaction   GAD 7 score 4, reports anxiety and feeling overwhelmed as she has started new program at [facility]. Reports that she would like Lexipro started again.  - Lexipro 10 mg daily       Other Visit Diagnoses       Vasomotor symptoms due to  menopause         Bilateral wrist pain         Bilateral hand pain           Return in about 2 weeks (around 12/19/2023) for Nurse visit for blood pressure check.    Toma Edwards, DO

## 2023-12-05 NOTE — Patient Instructions (Signed)
 Thank you, [name] for allowing us  to provide your care today. Today we discussed:  - I sent a referral to endocrinologist, they will reach out to you to schedule an appointment  - I would like for you to return back to the clinic in 2 weeks for a blood pressure check with the nurse  I have ordered the following labs for you:  Lab Orders  No laboratory test(s) ordered today     Tests ordered today:  None  Referrals ordered today:    Referral Orders         Ambulatory referral to Endocrinology      I have ordered the following medication/changed the following medications:   Stop the following medications: Medications Discontinued During This Encounter  Medication Reason   famotidine  (PEPCID ) 20 MG tablet Reorder   lidocaine -prilocaine  (EMLA ) cream Reorder   gabapentin  (NEURONTIN ) 600 MG tablet Reorder   escitalopram  (LEXAPRO ) 10 MG tablet Reorder   fluticasone  (FLONASE ) 50 MCG/ACT nasal spray Reorder     Start the following medications: Meds ordered this encounter  Medications   escitalopram  (LEXAPRO ) 10 MG tablet    Sig: Take 1 tablet (10 mg total) by mouth daily.    Dispense:  90 tablet    Refill:  4   famotidine  (PEPCID ) 20 MG tablet    Sig: Take 1 tablet (20 mg total) by mouth 2 (two) times daily.    Dispense:  60 tablet    Refill:  2   fluticasone  (FLONASE ) 50 MCG/ACT nasal spray    Sig: Place 2 sprays into both nostrils daily.    Dispense:  1 g    Refill:  2   gabapentin  (NEURONTIN ) 600 MG tablet    Sig: Take 1 tablet (600 mg total) by mouth at bedtime.    Dispense:  90 tablet    Refill:  0   lidocaine -prilocaine  (EMLA ) cream    Sig: Apply topically once for 1 dose.    Dispense:  30 g    Refill:  0    DX Code Needed  .     Follow up: 2 weeks with nurse visit for blood pressure check   Remember:   Should you have any questions or concerns please call the internal medicine clinic at [phone].     Rayann Atway, D.O. [facility]  Internal Medicine Center

## 2023-12-06 NOTE — Assessment & Plan Note (Signed)
 ECHO with EF 55 - 60 % with G1DD. All GDMT medications were stopped at the last OV d/t hypotension

## 2023-12-06 NOTE — Assessment & Plan Note (Signed)
 GAD 7 score 4, reports anxiety and feeling overwhelmed as she has started new program at [employer] [facility] [employer] [facility]. Reports that she would like Lexipro started again.  - Lexipro 10 mg daily

## 2023-12-06 NOTE — Assessment & Plan Note (Signed)
 Needs refill on Prilosec, reports acid reflux.  - Prilosec 20 mg daily

## 2023-12-06 NOTE — Assessment & Plan Note (Signed)
 BP Readings from Last 3 Encounters:  12/05/23 (!) 149/92  09/14/23 (!) 145/93  09/10/23 (!) 136/92   Per the last office visit 03/19, all of her medications were stopped due to low BP, these medications were Coreg  25 BID, chlorthalidone  25 mg daily, Imdur  90 mg twice daily, spironolactone  50 mg daily. She is currently not taking any GDMT medications. Denies any CP or SOB. Blood pressure is elevated in the clinic.  - 2 week RN visit for BP check, if it is still elevated, then will plan on starting ACEi   - 2 week RN BP check.

## 2023-12-06 NOTE — Assessment & Plan Note (Signed)
 Last admitted to [facility] on 08/15/2023 discharged on 3/14 with Cortef  taper, patient was supposed to follow-up with endocrinologist to establish care.  However patient reports that she has not been unable to schedule an appointment with endocrinologist.  She is currently not taking any steroid medications. Reports that she would like a new referral to endocrinologist. - Referral to endocrinologist is sent

## 2023-12-10 ENCOUNTER — Other Ambulatory Visit: Payer: Self-pay | Admitting: Licensed Clinical Social Worker

## 2023-12-13 NOTE — Progress Notes (Signed)
 Internal Medicine Clinic Attending  Case discussed with the resident at the time of the visit.  We reviewed the resident's history and exam and pertinent patient test results.  I agree with the assessment, diagnosis, and plan of care documented in the resident's note.

## 2023-12-19 ENCOUNTER — Ambulatory Visit

## 2023-12-31 ENCOUNTER — Ambulatory Visit

## 2024-01-21 ENCOUNTER — Telehealth: Payer: Self-pay | Admitting: *Deleted

## 2024-01-21 NOTE — Progress Notes (Signed)
 Complex Care Management Care Guide Note  01/21/2024 Name: [name] MRN: [id number] DOB: 1966-04-04  [name] is a 58 y.o. year old female who is a primary care patient of Tawkaliyar, Roya, DO and is actively engaged with the care management team. I reached out to [name] by phone today to assist with re-scheduling  with the Licensed Clinical Child psychotherapist.  Follow up plan: Unsuccessful telephone outreach attempt made. No further outreach attempts will be made at this time. We have been unable to contact the patient to reschedule for complex care management services.  Harlene Satterfield  [facility]  [facility], [facility] Guide  Direct Dial: [phone]  Fax [phone]

## 2024-02-27 ENCOUNTER — Ambulatory Visit: Payer: 59

## 2024-02-27 VITALS — Ht 66.0 in | Wt 199.0 lb

## 2024-02-27 DIAGNOSIS — Z0000 Encounter for general adult medical examination without abnormal findings: Secondary | ICD-10-CM

## 2024-02-27 NOTE — Patient Instructions (Signed)
 [name],  Thank you for taking the time for your Medicare Wellness Visit. I appreciate your continued commitment to your health goals. Please review the care plan we discussed, and feel free to reach out if I can assist you further.  Medicare recommends these wellness visits once per year to help you and your care team stay ahead of potential health issues. These visits are designed to focus on prevention, allowing your provider to concentrate on managing your acute and chronic conditions during your regular appointments.  Please note that Annual Wellness Visits do not include a physical exam. Some assessments may be limited, especially if the visit was conducted virtually. If needed, we may recommend a separate in-person follow-up with your provider.  Ongoing Care Seeing your primary care provider every 3 to 6 months helps us  monitor your health and provide consistent, personalized care.   Referrals If a referral was made during today's visit and you haven't received any updates within two weeks, please contact the referred provider directly to check on the status.  Recommended Screenings:  Health Maintenance  Topic Date Due   Pneumococcal Vaccine for age over 18 (1 of 2 - PCV) Never done   Zoster (Shingles) Vaccine (1 of 2) Never done   COVID-19 Vaccine (3 - Pfizer risk series) 09/25/2019   Breast Cancer Screening  03/31/2022   Flu Shot  01/04/2024   Medicare Annual Wellness Visit  02/26/2025   Colon Cancer Screening  03/04/2025   Pap with HPV screening  04/20/2025   DTaP/Tdap/Td vaccine (5 - Td or Tdap) 04/09/2031   Hepatitis C Screening  Completed   HIV Screening  Completed   Hepatitis B Vaccine  Aged Out   HPV Vaccine  Aged Out   Meningitis B Vaccine  Aged Out       02/27/2024    9:59 AM  Advanced Directives  Does Patient Have a Medical Advance Directive? No  Would patient like information on creating a medical advance directive? No - Patient declined   Advance Care  Planning is important because it: Ensures you receive medical care that aligns with your values, goals, and preferences. Provides guidance to your family and loved ones, reducing the emotional burden of decision-making during critical moments.  Vision: Annual vision screenings are recommended for early detection of glaucoma, cataracts, and diabetic retinopathy. These exams can also reveal signs of chronic conditions such as diabetes and high blood pressure.  Dental: Annual dental screenings help detect early signs of oral cancer, gum disease, and other conditions linked to overall health, including heart disease and diabetes.  Please see the attached documents for additional preventive care recommendations.
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
## 2024-03-08 ENCOUNTER — Other Ambulatory Visit: Payer: Self-pay | Admitting: Student

## 2024-03-09 ENCOUNTER — Other Ambulatory Visit: Payer: Self-pay | Admitting: Student

## 2024-03-09 DIAGNOSIS — M5416 Radiculopathy, lumbar region: Secondary | ICD-10-CM

## 2024-03-10 NOTE — Telephone Encounter (Signed)
 Medication sent to pharmacy

## 2025-03-04 ENCOUNTER — Ambulatory Visit
